# Patient Record
Sex: Female | Born: 1945
Health system: Southern US, Community
[De-identification: ages and names within clinical notes are randomized; demographics above are authoritative.]

## PROBLEM LIST (undated history)

## (undated) DIAGNOSIS — B029 Zoster without complications: Secondary | ICD-10-CM

## (undated) DIAGNOSIS — Z8489 Family history of other specified conditions: Secondary | ICD-10-CM

## (undated) DIAGNOSIS — H269 Unspecified cataract: Secondary | ICD-10-CM

## (undated) DIAGNOSIS — I2510 Atherosclerotic heart disease of native coronary artery without angina pectoris: Secondary | ICD-10-CM

## (undated) DIAGNOSIS — K219 Gastro-esophageal reflux disease without esophagitis: Secondary | ICD-10-CM

## (undated) DIAGNOSIS — Z8719 Personal history of other diseases of the digestive system: Secondary | ICD-10-CM

## (undated) DIAGNOSIS — M3390 Dermatopolymyositis, unspecified, organ involvement unspecified: Secondary | ICD-10-CM

## (undated) DIAGNOSIS — R569 Unspecified convulsions: Secondary | ICD-10-CM

## (undated) DIAGNOSIS — G4730 Sleep apnea, unspecified: Secondary | ICD-10-CM

## (undated) DIAGNOSIS — I5032 Chronic diastolic (congestive) heart failure: Secondary | ICD-10-CM

## (undated) DIAGNOSIS — D649 Anemia, unspecified: Secondary | ICD-10-CM

## (undated) DIAGNOSIS — I480 Paroxysmal atrial fibrillation: Secondary | ICD-10-CM

## (undated) DIAGNOSIS — G9520 Unspecified cord compression: Secondary | ICD-10-CM

## (undated) DIAGNOSIS — I10 Essential (primary) hypertension: Secondary | ICD-10-CM

## (undated) DIAGNOSIS — J342 Deviated nasal septum: Secondary | ICD-10-CM

## (undated) DIAGNOSIS — I471 Supraventricular tachycardia: Secondary | ICD-10-CM

## (undated) DIAGNOSIS — N3945 Continuous leakage: Secondary | ICD-10-CM

## (undated) DIAGNOSIS — E785 Hyperlipidemia, unspecified: Secondary | ICD-10-CM

## (undated) DIAGNOSIS — J189 Pneumonia, unspecified organism: Secondary | ICD-10-CM

## (undated) DIAGNOSIS — R600 Localized edema: Secondary | ICD-10-CM

## (undated) DIAGNOSIS — E039 Hypothyroidism, unspecified: Secondary | ICD-10-CM

## (undated) DIAGNOSIS — M3313 Other dermatomyositis without myopathy: Secondary | ICD-10-CM

## (undated) DIAGNOSIS — K648 Other hemorrhoids: Secondary | ICD-10-CM

## (undated) DIAGNOSIS — K449 Diaphragmatic hernia without obstruction or gangrene: Secondary | ICD-10-CM

## (undated) DIAGNOSIS — Z9289 Personal history of other medical treatment: Secondary | ICD-10-CM

## (undated) DIAGNOSIS — L03115 Cellulitis of right lower limb: Secondary | ICD-10-CM

## (undated) DIAGNOSIS — K8020 Calculus of gallbladder without cholecystitis without obstruction: Secondary | ICD-10-CM

## (undated) DIAGNOSIS — M1990 Unspecified osteoarthritis, unspecified site: Secondary | ICD-10-CM

## (undated) DIAGNOSIS — A0472 Enterocolitis due to Clostridium difficile, not specified as recurrent: Secondary | ICD-10-CM

## (undated) HISTORY — DX: Gastro-esophageal reflux disease without esophagitis: K21.9

## (undated) HISTORY — DX: Hyperlipidemia, unspecified: E78.5

## (undated) HISTORY — DX: Pneumonia, unspecified organism: J18.9

## (undated) HISTORY — PX: NASAL SEPTUM SURGERY: SHX37

## (undated) HISTORY — PX: CATARACT EXTRACTION, BILATERAL: SHX1313

## (undated) HISTORY — DX: Diaphragmatic hernia without obstruction or gangrene: K44.9

## (undated) HISTORY — DX: Deviated nasal septum: J34.2

## (undated) HISTORY — DX: Localized edema: R60.0

## (undated) HISTORY — DX: Unspecified osteoarthritis, unspecified site: M19.90

## (undated) HISTORY — DX: Hypothyroidism, unspecified: E03.9

## (undated) HISTORY — DX: Morbid (severe) obesity due to excess calories: E66.01

## (undated) HISTORY — DX: Enterocolitis due to Clostridium difficile, not specified as recurrent: A04.72

## (undated) HISTORY — DX: Zoster without complications: B02.9

## (undated) HISTORY — DX: Cellulitis of right lower limb: L03.115

## (undated) HISTORY — DX: Chronic diastolic (congestive) heart failure: I50.32

## (undated) HISTORY — PX: TOTAL ABDOMINAL HYSTERECTOMY: SHX209

## (undated) HISTORY — DX: Other dermatomyositis without myopathy: M33.13

## (undated) HISTORY — DX: Sleep apnea, unspecified: G47.30

## (undated) HISTORY — DX: Supraventricular tachycardia: I47.1

## (undated) HISTORY — DX: Atherosclerotic heart disease of native coronary artery without angina pectoris: I25.10

## (undated) HISTORY — DX: Dermatopolymyositis, unspecified, organ involvement unspecified: M33.90

## (undated) HISTORY — DX: Personal history of other medical treatment: Z92.89

## (undated) HISTORY — DX: Continuous leakage: N39.45

## (undated) HISTORY — PX: VERTEBROPLASTY: SHX113

## (undated) HISTORY — DX: Unspecified convulsions: R56.9

## (undated) HISTORY — DX: Essential (primary) hypertension: I10

## (undated) HISTORY — DX: Unspecified cataract: H26.9

## (undated) HISTORY — DX: Personal history of other diseases of the digestive system: Z87.19

## (undated) HISTORY — PX: WISDOM TOOTH EXTRACTION: SHX21

## (undated) HISTORY — DX: Other hemorrhoids: K64.8

## (undated) HISTORY — DX: Anemia, unspecified: D64.9

## (undated) HISTORY — DX: Calculus of gallbladder without cholecystitis without obstruction: K80.20

## (undated) HISTORY — DX: Paroxysmal atrial fibrillation: I48.0

---

## 1998-07-08 ENCOUNTER — Emergency Department (HOSPITAL_COMMUNITY): Admission: EM | Admit: 1998-07-08 | Discharge: 1998-07-08 | Payer: Self-pay | Admitting: Emergency Medicine

## 1999-02-18 ENCOUNTER — Encounter: Payer: Self-pay | Admitting: Family Medicine

## 1999-02-18 ENCOUNTER — Ambulatory Visit (HOSPITAL_COMMUNITY): Admission: RE | Admit: 1999-02-18 | Discharge: 1999-02-18 | Payer: Self-pay | Admitting: Family Medicine

## 1999-08-30 ENCOUNTER — Encounter: Payer: Self-pay | Admitting: Emergency Medicine

## 1999-08-30 ENCOUNTER — Emergency Department (HOSPITAL_COMMUNITY): Admission: EM | Admit: 1999-08-30 | Discharge: 1999-08-30 | Payer: Self-pay | Admitting: Emergency Medicine

## 1999-11-30 ENCOUNTER — Emergency Department (HOSPITAL_COMMUNITY): Admission: EM | Admit: 1999-11-30 | Discharge: 1999-11-30 | Payer: Self-pay | Admitting: Emergency Medicine

## 1999-11-30 ENCOUNTER — Encounter: Payer: Self-pay | Admitting: Emergency Medicine

## 2000-01-05 ENCOUNTER — Other Ambulatory Visit: Admission: RE | Admit: 2000-01-05 | Discharge: 2000-01-05 | Payer: Self-pay | Admitting: Obstetrics and Gynecology

## 2000-03-18 ENCOUNTER — Encounter: Payer: Self-pay | Admitting: Emergency Medicine

## 2000-03-18 ENCOUNTER — Inpatient Hospital Stay (HOSPITAL_COMMUNITY): Admission: EM | Admit: 2000-03-18 | Discharge: 2000-03-20 | Payer: Self-pay | Admitting: Emergency Medicine

## 2000-04-06 ENCOUNTER — Emergency Department (HOSPITAL_COMMUNITY): Admission: EM | Admit: 2000-04-06 | Discharge: 2000-04-06 | Payer: Self-pay | Admitting: *Deleted

## 2000-05-01 ENCOUNTER — Ambulatory Visit (HOSPITAL_COMMUNITY): Admission: RE | Admit: 2000-05-01 | Discharge: 2000-05-01 | Payer: Self-pay | Admitting: Endocrinology

## 2000-05-16 ENCOUNTER — Ambulatory Visit (HOSPITAL_BASED_OUTPATIENT_CLINIC_OR_DEPARTMENT_OTHER): Admission: RE | Admit: 2000-05-16 | Discharge: 2000-05-16 | Payer: Self-pay | Admitting: Pulmonary Disease

## 2000-08-13 ENCOUNTER — Encounter: Payer: Self-pay | Admitting: Emergency Medicine

## 2000-08-13 ENCOUNTER — Inpatient Hospital Stay (HOSPITAL_COMMUNITY): Admission: EM | Admit: 2000-08-13 | Discharge: 2000-08-15 | Payer: Self-pay | Admitting: Emergency Medicine

## 2000-08-22 ENCOUNTER — Inpatient Hospital Stay (HOSPITAL_COMMUNITY): Admission: EM | Admit: 2000-08-22 | Discharge: 2000-08-23 | Payer: Self-pay

## 2000-08-22 ENCOUNTER — Encounter: Payer: Self-pay | Admitting: Internal Medicine

## 2000-08-23 ENCOUNTER — Encounter: Payer: Self-pay | Admitting: Internal Medicine

## 2000-09-10 ENCOUNTER — Inpatient Hospital Stay (HOSPITAL_COMMUNITY): Admission: EM | Admit: 2000-09-10 | Discharge: 2000-09-14 | Payer: Self-pay | Admitting: Emergency Medicine

## 2000-09-10 ENCOUNTER — Encounter: Payer: Self-pay | Admitting: Cardiology

## 2000-10-19 ENCOUNTER — Ambulatory Visit (HOSPITAL_COMMUNITY): Admission: RE | Admit: 2000-10-19 | Discharge: 2000-10-19 | Payer: Self-pay | Admitting: Internal Medicine

## 2000-10-19 ENCOUNTER — Encounter: Payer: Self-pay | Admitting: Internal Medicine

## 2000-11-09 ENCOUNTER — Encounter: Payer: Self-pay | Admitting: Emergency Medicine

## 2000-11-09 ENCOUNTER — Emergency Department (HOSPITAL_COMMUNITY): Admission: EM | Admit: 2000-11-09 | Discharge: 2000-11-09 | Payer: Self-pay | Admitting: Emergency Medicine

## 2000-12-19 ENCOUNTER — Encounter: Payer: Self-pay | Admitting: Cardiology

## 2000-12-20 ENCOUNTER — Inpatient Hospital Stay (HOSPITAL_COMMUNITY): Admission: EM | Admit: 2000-12-20 | Discharge: 2000-12-22 | Payer: Self-pay | Admitting: Emergency Medicine

## 2000-12-24 ENCOUNTER — Emergency Department (HOSPITAL_COMMUNITY): Admission: EM | Admit: 2000-12-24 | Discharge: 2000-12-24 | Payer: Self-pay

## 2001-01-05 ENCOUNTER — Encounter: Admission: RE | Admit: 2001-01-05 | Discharge: 2001-01-05 | Payer: Self-pay | Admitting: Rheumatology

## 2001-01-05 ENCOUNTER — Encounter: Payer: Self-pay | Admitting: Rheumatology

## 2001-01-06 ENCOUNTER — Encounter: Payer: Self-pay | Admitting: Rheumatology

## 2001-01-06 ENCOUNTER — Ambulatory Visit (HOSPITAL_COMMUNITY): Admission: RE | Admit: 2001-01-06 | Discharge: 2001-01-06 | Payer: Self-pay | Admitting: Rheumatology

## 2001-01-08 ENCOUNTER — Encounter: Admission: RE | Admit: 2001-01-08 | Discharge: 2001-01-08 | Payer: Self-pay | Admitting: Internal Medicine

## 2001-01-08 ENCOUNTER — Encounter: Payer: Self-pay | Admitting: Internal Medicine

## 2001-01-09 ENCOUNTER — Encounter: Payer: Self-pay | Admitting: Internal Medicine

## 2001-01-09 ENCOUNTER — Emergency Department (HOSPITAL_COMMUNITY): Admission: EM | Admit: 2001-01-09 | Discharge: 2001-01-09 | Payer: Self-pay | Admitting: Emergency Medicine

## 2001-09-07 ENCOUNTER — Encounter: Admission: RE | Admit: 2001-09-07 | Discharge: 2001-09-10 | Payer: Self-pay | Admitting: Internal Medicine

## 2001-10-02 ENCOUNTER — Encounter: Payer: Self-pay | Admitting: Emergency Medicine

## 2001-10-02 ENCOUNTER — Inpatient Hospital Stay (HOSPITAL_COMMUNITY): Admission: EM | Admit: 2001-10-02 | Discharge: 2001-10-04 | Payer: Self-pay | Admitting: Emergency Medicine

## 2001-12-11 ENCOUNTER — Other Ambulatory Visit: Admission: RE | Admit: 2001-12-11 | Discharge: 2001-12-11 | Payer: Self-pay | Admitting: Obstetrics and Gynecology

## 2002-06-16 ENCOUNTER — Inpatient Hospital Stay (HOSPITAL_COMMUNITY): Admission: EM | Admit: 2002-06-16 | Discharge: 2002-06-18 | Payer: Self-pay | Admitting: Emergency Medicine

## 2002-06-17 ENCOUNTER — Encounter: Payer: Self-pay | Admitting: Endocrinology

## 2003-11-28 ENCOUNTER — Emergency Department (HOSPITAL_COMMUNITY): Admission: EM | Admit: 2003-11-28 | Discharge: 2003-11-28 | Payer: Self-pay | Admitting: *Deleted

## 2004-01-13 ENCOUNTER — Emergency Department (HOSPITAL_COMMUNITY): Admission: EM | Admit: 2004-01-13 | Discharge: 2004-01-14 | Payer: Self-pay | Admitting: Emergency Medicine

## 2004-03-12 ENCOUNTER — Ambulatory Visit (HOSPITAL_COMMUNITY): Admission: RE | Admit: 2004-03-12 | Discharge: 2004-03-12 | Payer: Self-pay | Admitting: Endocrinology

## 2004-04-19 ENCOUNTER — Ambulatory Visit: Payer: Self-pay | Admitting: Cardiology

## 2004-05-07 ENCOUNTER — Ambulatory Visit: Payer: Self-pay | Admitting: Internal Medicine

## 2004-06-04 ENCOUNTER — Ambulatory Visit: Payer: Self-pay | Admitting: Cardiology

## 2004-06-24 ENCOUNTER — Ambulatory Visit: Payer: Self-pay | Admitting: Cardiology

## 2004-07-02 ENCOUNTER — Ambulatory Visit: Payer: Self-pay | Admitting: Cardiovascular Disease

## 2004-07-30 ENCOUNTER — Ambulatory Visit: Payer: Self-pay | Admitting: Cardiology

## 2004-08-27 ENCOUNTER — Ambulatory Visit: Payer: Self-pay | Admitting: Internal Medicine

## 2004-09-21 ENCOUNTER — Ambulatory Visit: Payer: Self-pay | Admitting: Cardiology

## 2004-10-19 ENCOUNTER — Ambulatory Visit: Payer: Self-pay | Admitting: *Deleted

## 2004-10-24 ENCOUNTER — Observation Stay (HOSPITAL_COMMUNITY): Admission: EM | Admit: 2004-10-24 | Discharge: 2004-10-25 | Payer: Self-pay | Admitting: Emergency Medicine

## 2004-10-25 ENCOUNTER — Ambulatory Visit: Payer: Self-pay | Admitting: Cardiology

## 2004-11-16 ENCOUNTER — Ambulatory Visit: Payer: Self-pay | Admitting: Cardiology

## 2004-11-19 ENCOUNTER — Ambulatory Visit: Payer: Self-pay | Admitting: Cardiology

## 2004-11-30 ENCOUNTER — Ambulatory Visit: Payer: Self-pay | Admitting: Cardiology

## 2004-12-21 ENCOUNTER — Ambulatory Visit: Payer: Self-pay | Admitting: Cardiology

## 2005-01-11 ENCOUNTER — Ambulatory Visit: Payer: Self-pay | Admitting: Cardiology

## 2005-02-08 ENCOUNTER — Ambulatory Visit: Payer: Self-pay | Admitting: Cardiology

## 2005-02-21 ENCOUNTER — Emergency Department (HOSPITAL_COMMUNITY): Admission: EM | Admit: 2005-02-21 | Discharge: 2005-02-21 | Payer: Self-pay | Admitting: Emergency Medicine

## 2005-02-23 ENCOUNTER — Ambulatory Visit: Payer: Self-pay | Admitting: Cardiology

## 2005-03-01 ENCOUNTER — Ambulatory Visit: Payer: Self-pay | Admitting: Internal Medicine

## 2005-03-16 ENCOUNTER — Ambulatory Visit: Payer: Self-pay | Admitting: Cardiology

## 2005-03-24 ENCOUNTER — Ambulatory Visit: Payer: Self-pay | Admitting: Endocrinology

## 2005-04-01 ENCOUNTER — Ambulatory Visit: Payer: Self-pay | Admitting: Internal Medicine

## 2005-04-01 ENCOUNTER — Inpatient Hospital Stay (HOSPITAL_COMMUNITY): Admission: EM | Admit: 2005-04-01 | Discharge: 2005-04-04 | Payer: Self-pay | Admitting: Emergency Medicine

## 2005-04-13 ENCOUNTER — Ambulatory Visit: Payer: Self-pay | Admitting: *Deleted

## 2005-05-09 ENCOUNTER — Ambulatory Visit: Payer: Self-pay | Admitting: Cardiology

## 2005-05-26 ENCOUNTER — Ambulatory Visit: Payer: Self-pay | Admitting: Internal Medicine

## 2005-06-07 ENCOUNTER — Ambulatory Visit: Payer: Self-pay | Admitting: Cardiology

## 2005-06-20 ENCOUNTER — Ambulatory Visit: Payer: Self-pay | Admitting: Cardiology

## 2005-06-23 ENCOUNTER — Other Ambulatory Visit: Admission: RE | Admit: 2005-06-23 | Discharge: 2005-06-23 | Payer: Self-pay | Admitting: Obstetrics and Gynecology

## 2005-06-27 ENCOUNTER — Ambulatory Visit: Payer: Self-pay | Admitting: Cardiology

## 2005-07-11 ENCOUNTER — Ambulatory Visit: Payer: Self-pay | Admitting: Cardiology

## 2005-08-01 ENCOUNTER — Ambulatory Visit: Payer: Self-pay | Admitting: Cardiology

## 2005-08-15 ENCOUNTER — Ambulatory Visit: Payer: Self-pay | Admitting: Cardiology

## 2005-08-29 ENCOUNTER — Ambulatory Visit: Payer: Self-pay | Admitting: Cardiology

## 2005-09-12 ENCOUNTER — Ambulatory Visit: Payer: Self-pay | Admitting: Cardiology

## 2005-10-04 ENCOUNTER — Ambulatory Visit: Payer: Self-pay | Admitting: Cardiovascular Disease

## 2005-10-04 ENCOUNTER — Ambulatory Visit: Payer: Self-pay | Admitting: Cardiology

## 2005-10-24 ENCOUNTER — Ambulatory Visit: Payer: Self-pay | Admitting: Cardiology

## 2005-11-11 ENCOUNTER — Ambulatory Visit: Payer: Self-pay | Admitting: Internal Medicine

## 2005-11-21 ENCOUNTER — Ambulatory Visit: Payer: Self-pay | Admitting: Cardiology

## 2005-12-13 ENCOUNTER — Ambulatory Visit: Payer: Self-pay | Admitting: Internal Medicine

## 2005-12-19 ENCOUNTER — Ambulatory Visit: Payer: Self-pay | Admitting: Cardiology

## 2005-12-26 ENCOUNTER — Ambulatory Visit: Payer: Self-pay | Admitting: Cardiology

## 2006-01-23 ENCOUNTER — Ambulatory Visit: Payer: Self-pay | Admitting: Cardiology

## 2006-02-14 ENCOUNTER — Ambulatory Visit: Payer: Self-pay | Admitting: Cardiovascular Disease

## 2006-02-24 ENCOUNTER — Ambulatory Visit: Payer: Self-pay | Admitting: Internal Medicine

## 2006-03-03 ENCOUNTER — Ambulatory Visit: Payer: Self-pay | Admitting: Cardiovascular Disease

## 2006-03-07 ENCOUNTER — Ambulatory Visit: Payer: Self-pay | Admitting: Cardiology

## 2006-03-07 ENCOUNTER — Inpatient Hospital Stay (HOSPITAL_COMMUNITY): Admission: EM | Admit: 2006-03-07 | Discharge: 2006-03-13 | Payer: Self-pay | Admitting: *Deleted

## 2006-03-17 ENCOUNTER — Ambulatory Visit: Payer: Self-pay | Admitting: Cardiology

## 2006-03-20 ENCOUNTER — Ambulatory Visit: Payer: Self-pay

## 2006-03-24 ENCOUNTER — Ambulatory Visit: Payer: Self-pay | Admitting: Cardiology

## 2006-04-05 ENCOUNTER — Ambulatory Visit: Payer: Self-pay | Admitting: Cardiology

## 2006-05-03 ENCOUNTER — Ambulatory Visit: Payer: Self-pay | Admitting: Cardiology

## 2006-05-31 ENCOUNTER — Ambulatory Visit: Payer: Self-pay | Admitting: Cardiology

## 2006-06-28 ENCOUNTER — Ambulatory Visit: Payer: Self-pay | Admitting: Cardiology

## 2006-07-26 ENCOUNTER — Ambulatory Visit: Payer: Self-pay | Admitting: Cardiology

## 2006-08-09 ENCOUNTER — Ambulatory Visit: Payer: Self-pay | Admitting: Internal Medicine

## 2006-08-15 ENCOUNTER — Ambulatory Visit (HOSPITAL_COMMUNITY): Admission: RE | Admit: 2006-08-15 | Discharge: 2006-08-15 | Payer: Self-pay | Admitting: Emergency Medicine

## 2006-08-16 ENCOUNTER — Emergency Department (HOSPITAL_COMMUNITY): Admission: EM | Admit: 2006-08-16 | Discharge: 2006-08-16 | Payer: Self-pay | Admitting: Emergency Medicine

## 2006-08-30 ENCOUNTER — Ambulatory Visit: Payer: Self-pay | Admitting: Cardiology

## 2006-09-13 ENCOUNTER — Emergency Department (HOSPITAL_COMMUNITY): Admission: EM | Admit: 2006-09-13 | Discharge: 2006-09-13 | Payer: Self-pay | Admitting: Emergency Medicine

## 2006-09-20 ENCOUNTER — Ambulatory Visit: Payer: Self-pay | Admitting: Cardiology

## 2006-09-27 ENCOUNTER — Ambulatory Visit: Payer: Self-pay | Admitting: Internal Medicine

## 2006-10-06 ENCOUNTER — Ambulatory Visit: Payer: Self-pay | Admitting: Cardiology

## 2006-10-24 ENCOUNTER — Ambulatory Visit: Payer: Self-pay | Admitting: Cardiology

## 2006-11-07 ENCOUNTER — Ambulatory Visit: Payer: Self-pay | Admitting: Cardiology

## 2006-11-13 ENCOUNTER — Encounter: Admission: RE | Admit: 2006-11-13 | Discharge: 2006-11-13 | Payer: Self-pay | Admitting: Internal Medicine

## 2006-11-28 ENCOUNTER — Ambulatory Visit: Payer: Self-pay | Admitting: Internal Medicine

## 2006-12-18 ENCOUNTER — Ambulatory Visit: Payer: Self-pay | Admitting: Cardiology

## 2007-01-15 ENCOUNTER — Ambulatory Visit: Payer: Self-pay | Admitting: Cardiology

## 2007-02-05 ENCOUNTER — Ambulatory Visit: Payer: Self-pay | Admitting: Cardiology

## 2007-02-19 ENCOUNTER — Ambulatory Visit: Payer: Self-pay | Admitting: Cardiology

## 2007-03-05 ENCOUNTER — Ambulatory Visit: Payer: Self-pay | Admitting: Cardiovascular Disease

## 2007-03-19 ENCOUNTER — Ambulatory Visit: Payer: Self-pay | Admitting: Cardiology

## 2007-03-30 ENCOUNTER — Ambulatory Visit: Payer: Self-pay | Admitting: Cardiology

## 2007-04-10 ENCOUNTER — Ambulatory Visit: Payer: Self-pay | Admitting: Cardiology

## 2007-05-08 ENCOUNTER — Ambulatory Visit: Payer: Self-pay | Admitting: Cardiovascular Disease

## 2007-06-05 ENCOUNTER — Ambulatory Visit: Payer: Self-pay | Admitting: Cardiology

## 2007-06-26 ENCOUNTER — Ambulatory Visit: Payer: Self-pay | Admitting: Cardiology

## 2007-07-24 ENCOUNTER — Ambulatory Visit: Payer: Self-pay | Admitting: Internal Medicine

## 2007-08-21 ENCOUNTER — Ambulatory Visit: Payer: Self-pay | Admitting: Cardiology

## 2007-09-18 ENCOUNTER — Ambulatory Visit: Payer: Self-pay | Admitting: Cardiovascular Disease

## 2007-09-26 ENCOUNTER — Ambulatory Visit: Payer: Self-pay | Admitting: Cardiology

## 2007-10-16 ENCOUNTER — Ambulatory Visit: Payer: Self-pay | Admitting: Cardiovascular Disease

## 2007-11-13 ENCOUNTER — Ambulatory Visit: Payer: Self-pay | Admitting: Cardiovascular Disease

## 2007-12-11 ENCOUNTER — Ambulatory Visit: Payer: Self-pay | Admitting: Internal Medicine

## 2008-01-08 ENCOUNTER — Ambulatory Visit: Payer: Self-pay | Admitting: Cardiology

## 2008-01-29 ENCOUNTER — Encounter: Admission: RE | Admit: 2008-01-29 | Discharge: 2008-01-29 | Payer: Self-pay | Admitting: Internal Medicine

## 2008-01-29 ENCOUNTER — Ambulatory Visit: Payer: Self-pay | Admitting: Cardiology

## 2008-02-19 ENCOUNTER — Ambulatory Visit: Payer: Self-pay | Admitting: Cardiology

## 2008-03-17 ENCOUNTER — Ambulatory Visit: Payer: Self-pay | Admitting: Internal Medicine

## 2008-03-17 ENCOUNTER — Ambulatory Visit: Payer: Self-pay | Admitting: Cardiology

## 2008-03-20 ENCOUNTER — Ambulatory Visit: Payer: Self-pay

## 2008-03-21 ENCOUNTER — Ambulatory Visit: Payer: Self-pay

## 2008-04-08 ENCOUNTER — Ambulatory Visit: Payer: Self-pay

## 2008-04-16 ENCOUNTER — Ambulatory Visit: Payer: Self-pay | Admitting: Cardiovascular Disease

## 2008-05-12 ENCOUNTER — Ambulatory Visit: Payer: Self-pay | Admitting: Cardiovascular Disease

## 2008-06-02 ENCOUNTER — Ambulatory Visit: Payer: Self-pay | Admitting: Cardiology

## 2008-06-23 ENCOUNTER — Ambulatory Visit: Payer: Self-pay | Admitting: Cardiology

## 2008-07-21 ENCOUNTER — Ambulatory Visit: Payer: Self-pay | Admitting: Cardiology

## 2008-08-18 ENCOUNTER — Ambulatory Visit: Payer: Self-pay | Admitting: Internal Medicine

## 2008-09-05 ENCOUNTER — Ambulatory Visit: Payer: Self-pay | Admitting: Cardiology

## 2008-09-19 ENCOUNTER — Ambulatory Visit: Payer: Self-pay | Admitting: Internal Medicine

## 2008-09-20 DIAGNOSIS — R609 Edema, unspecified: Secondary | ICD-10-CM | POA: Insufficient documentation

## 2008-09-20 DIAGNOSIS — R0609 Other forms of dyspnea: Secondary | ICD-10-CM | POA: Insufficient documentation

## 2008-09-20 DIAGNOSIS — R0989 Other specified symptoms and signs involving the circulatory and respiratory systems: Secondary | ICD-10-CM | POA: Insufficient documentation

## 2008-09-25 ENCOUNTER — Encounter: Payer: Self-pay | Admitting: Cardiology

## 2008-09-25 ENCOUNTER — Ambulatory Visit: Payer: Self-pay | Admitting: Cardiology

## 2008-09-25 DIAGNOSIS — I2510 Atherosclerotic heart disease of native coronary artery without angina pectoris: Secondary | ICD-10-CM | POA: Insufficient documentation

## 2008-10-13 ENCOUNTER — Ambulatory Visit: Payer: Self-pay | Admitting: Internal Medicine

## 2008-10-27 ENCOUNTER — Ambulatory Visit: Payer: Self-pay | Admitting: Internal Medicine

## 2008-11-11 ENCOUNTER — Encounter: Payer: Self-pay | Admitting: *Deleted

## 2008-11-11 ENCOUNTER — Ambulatory Visit: Payer: Self-pay | Admitting: Cardiovascular Disease

## 2008-11-11 LAB — CONVERTED CEMR LAB
POC INR: 2.3
Protime: 18.5

## 2008-12-02 ENCOUNTER — Encounter (INDEPENDENT_AMBULATORY_CARE_PROVIDER_SITE_OTHER): Payer: Self-pay | Admitting: Pharmacist

## 2008-12-02 ENCOUNTER — Ambulatory Visit: Payer: Self-pay | Admitting: Cardiovascular Disease

## 2008-12-02 LAB — CONVERTED CEMR LAB
POC INR: 2.5
Protime: 19.2

## 2008-12-17 ENCOUNTER — Encounter: Payer: Self-pay | Admitting: *Deleted

## 2008-12-30 ENCOUNTER — Ambulatory Visit: Payer: Self-pay | Admitting: Cardiology

## 2008-12-30 LAB — CONVERTED CEMR LAB
POC INR: 2.8
Prothrombin Time: 20.1 s

## 2009-01-22 ENCOUNTER — Encounter (INDEPENDENT_AMBULATORY_CARE_PROVIDER_SITE_OTHER): Payer: Self-pay | Admitting: *Deleted

## 2009-01-27 ENCOUNTER — Ambulatory Visit: Payer: Self-pay | Admitting: Cardiovascular Disease

## 2009-01-27 LAB — CONVERTED CEMR LAB: POC INR: 1.9

## 2009-02-18 ENCOUNTER — Ambulatory Visit: Payer: Self-pay | Admitting: Cardiology

## 2009-02-18 LAB — CONVERTED CEMR LAB: POC INR: 2

## 2009-03-10 ENCOUNTER — Ambulatory Visit: Payer: Self-pay | Admitting: Cardiology

## 2009-03-10 ENCOUNTER — Telehealth (INDEPENDENT_AMBULATORY_CARE_PROVIDER_SITE_OTHER): Payer: Self-pay | Admitting: Physician Assistant

## 2009-03-10 LAB — CONVERTED CEMR LAB
INR: 7.1 (ref 0.8–1.0)
Prothrombin Time: 72.5 s (ref 9.1–11.7)

## 2009-03-13 ENCOUNTER — Ambulatory Visit: Payer: Self-pay | Admitting: Cardiology

## 2009-03-13 ENCOUNTER — Ambulatory Visit: Payer: Self-pay | Admitting: Internal Medicine

## 2009-03-13 LAB — CONVERTED CEMR LAB: POC INR: 3.7

## 2009-03-20 ENCOUNTER — Ambulatory Visit: Payer: Self-pay | Admitting: Internal Medicine

## 2009-03-20 LAB — CONVERTED CEMR LAB: POC INR: 2.8

## 2009-03-23 LAB — CONVERTED CEMR LAB: Digitoxin Lvl: 0.8 ng/mL (ref 0.8–2.0)

## 2009-04-10 ENCOUNTER — Ambulatory Visit: Payer: Self-pay | Admitting: Cardiovascular Disease

## 2009-04-10 LAB — CONVERTED CEMR LAB: POC INR: 3.7

## 2009-04-24 ENCOUNTER — Ambulatory Visit: Payer: Self-pay | Admitting: Internal Medicine

## 2009-04-24 LAB — CONVERTED CEMR LAB: POC INR: 2.7

## 2009-05-15 ENCOUNTER — Ambulatory Visit: Payer: Self-pay | Admitting: Cardiology

## 2009-05-15 LAB — CONVERTED CEMR LAB: POC INR: 2.6

## 2009-06-11 ENCOUNTER — Ambulatory Visit: Payer: Self-pay | Admitting: Cardiovascular Disease

## 2009-06-11 LAB — CONVERTED CEMR LAB: POC INR: 2.4

## 2009-07-10 ENCOUNTER — Ambulatory Visit: Payer: Self-pay | Admitting: Internal Medicine

## 2009-07-10 LAB — CONVERTED CEMR LAB: POC INR: 3

## 2009-08-07 ENCOUNTER — Ambulatory Visit: Payer: Self-pay | Admitting: Internal Medicine

## 2009-08-07 LAB — CONVERTED CEMR LAB: POC INR: 2.6

## 2009-08-11 ENCOUNTER — Encounter: Admission: RE | Admit: 2009-08-11 | Discharge: 2009-08-11 | Payer: Self-pay | Admitting: Internal Medicine

## 2009-08-14 ENCOUNTER — Ambulatory Visit: Payer: Self-pay | Admitting: Cardiology

## 2009-09-04 ENCOUNTER — Ambulatory Visit: Payer: Self-pay | Admitting: Cardiology

## 2009-09-04 LAB — CONVERTED CEMR LAB: POC INR: 3.1

## 2009-10-01 ENCOUNTER — Ambulatory Visit: Payer: Self-pay | Admitting: Internal Medicine

## 2009-10-01 LAB — CONVERTED CEMR LAB: POC INR: 2.8

## 2009-10-29 ENCOUNTER — Ambulatory Visit: Payer: Self-pay | Admitting: Internal Medicine

## 2009-10-29 LAB — CONVERTED CEMR LAB: POC INR: 2.6

## 2009-11-10 ENCOUNTER — Encounter: Admission: RE | Admit: 2009-11-10 | Discharge: 2009-11-10 | Payer: Self-pay | Admitting: Internal Medicine

## 2009-11-26 ENCOUNTER — Ambulatory Visit: Payer: Self-pay | Admitting: Cardiovascular Disease

## 2009-11-26 LAB — CONVERTED CEMR LAB: POC INR: 4.1

## 2009-12-10 ENCOUNTER — Ambulatory Visit: Payer: Self-pay | Admitting: Cardiology

## 2009-12-10 LAB — CONVERTED CEMR LAB: POC INR: 2.9

## 2010-01-07 ENCOUNTER — Ambulatory Visit: Payer: Self-pay | Admitting: Cardiology

## 2010-01-07 LAB — CONVERTED CEMR LAB: POC INR: 2.9

## 2010-02-04 ENCOUNTER — Ambulatory Visit: Payer: Self-pay | Admitting: Internal Medicine

## 2010-02-04 LAB — CONVERTED CEMR LAB: POC INR: 3.2

## 2010-02-26 ENCOUNTER — Ambulatory Visit: Payer: Self-pay | Admitting: Cardiology

## 2010-03-04 ENCOUNTER — Ambulatory Visit: Payer: Self-pay | Admitting: Internal Medicine

## 2010-03-04 LAB — CONVERTED CEMR LAB: POC INR: 2.9

## 2010-04-02 ENCOUNTER — Ambulatory Visit: Payer: Self-pay | Admitting: Cardiovascular Disease

## 2010-04-02 LAB — CONVERTED CEMR LAB: POC INR: 4.1

## 2010-04-16 ENCOUNTER — Ambulatory Visit: Payer: Self-pay | Admitting: Cardiology

## 2010-04-16 LAB — CONVERTED CEMR LAB: POC INR: 2.8

## 2010-05-14 ENCOUNTER — Ambulatory Visit: Payer: Self-pay | Admitting: Cardiology

## 2010-05-14 LAB — CONVERTED CEMR LAB: POC INR: 3.4

## 2010-06-04 ENCOUNTER — Ambulatory Visit: Payer: Self-pay | Admitting: Cardiology

## 2010-06-04 LAB — CONVERTED CEMR LAB: POC INR: 3

## 2010-06-24 ENCOUNTER — Ambulatory Visit
Admission: RE | Admit: 2010-06-24 | Discharge: 2010-06-24 | Payer: Self-pay | Source: Home / Self Care | Attending: Cardiology | Admitting: Cardiology

## 2010-06-24 LAB — CONVERTED CEMR LAB: POC INR: 3.5

## 2010-07-08 ENCOUNTER — Ambulatory Visit: Admission: RE | Admit: 2010-07-08 | Discharge: 2010-07-08 | Payer: Self-pay | Source: Home / Self Care

## 2010-07-08 LAB — CONVERTED CEMR LAB: POC INR: 3.1

## 2010-07-13 NOTE — Letter (Signed)
Summary: Handout Printed  Printed Handout:  - Coumadin Instructions 

## 2010-07-13 NOTE — Medication Information (Signed)
Summary: rov-tp  Anticoagulant Therapy  Managed by: Weston Brass, PharmD Referring MD: Valera Castle MD PCP: Dr. Hunt Oris MD: Eden Emms MD, Theron Arista Indication 1: Atrial Fibrillation (ICD-427.31) Lab Used: [facility] Site: [facility] INR POC 4.1 INR RANGE 2 - 3  Dietary changes: yes       Details: less salads  Health status changes: no    Bleeding/hemorrhagic complications: no    Recent/future hospitalizations: no    Any changes in medication regimen? yes       Details: took cipro x 6 days and bactrim DS x 5 days for infection   Recent/future dental: no  Any missed doses?: no       Is patient compliant with meds? yes       Current Medications (verified): 1)  Sotalol Hcl 120 Mg Tabs (Sotalol Hcl) .Marland Kitchen.. 1 Tab Two Times A Day 2)  Digoxin 0.125 Mg Tabs (Digoxin) .Marland Kitchen.. 1 Tab Once Daily 3)  Ramipril 2.5 Mg Caps (Ramipril) .Marland Kitchen.. 1 Once Daily 4)  Lasix 40 Mg Tabs (Furosemide) .Marland Kitchen.. 1 Tab Qam..1/2 Tab At Bedtime 5)  Aspirin 81 Mg Tbec (Aspirin) .... Take One Tablet By Mouth Daily 6)  Potassium Chloride Crys Cr 20 Meq Cr-Tabs (Potassium Chloride Crys Cr) .Marland Kitchen.. 1 Tab Two Times A Day 7)  Synthroid 100 Mcg Tabs (Levothyroxine Sodium) .Marland Kitchen.. 1 Tab Once Daily 8)  Nitroglycerin 0.4 Mg Subl (Nitroglycerin) .... One Tablet Under Tongue Every 5 Minutes As Needed For Chest Pain---May Repeat Times Three 9)  Methotrexate 2.5 Mg Tabs (Methotrexate Sodium) .... 5 Tabs Weekly 10)  Hydroxychloroquine Sulfate 200 Mg Tabs (Hydroxychloroquine Sulfate) .Marland Kitchen.. 1 Tab Two Times A Day 11)  Prednisone 10 Mg Tabs (Prednisone) .Marland Kitchen.. 1 Tab Once Daily 12)  Calcium 600 1500 Mg Tabs (Calcium Carbonate) .Marland Kitchen.. 1 Tab Once Daily 13)  Actonel 150 Mg Tabs (Risedronate Sodium) .Marland Kitchen.. 1 Tab Monthly 14)  Protonix 40 Mg Pack (Pantoprazole Sodium) .Marland Kitchen.. 1 Tab Once Daily 15)  Lyrica 75 Mg Caps (Pregabalin) .Marland Kitchen.. 1 Tab Once Daily 16)  Multivitamins   Tabs (Multiple Vitamin) .Marland Kitchen.. 1 Tab Once Daily 17)  Vitamin D 78295 Unit Caps  (Ergocalciferol) .Marland Kitchen.. 1 Tab Every Month 18)  Allopurinol 300 Mg Tabs (Allopurinol) .Marland Kitchen.. 1 Tab Once Daily 19)  Coumadin 5 Mg Tabs (Warfarin Sodium) .... Take As Directed By Coumadin Clinic. 20)  Vitamin D3 2000 Unit Caps (Cholecalciferol) .Marland Kitchen.. 1 Tab Once Daily 21)  Meloxicam 15 Mg Tabs (Meloxicam) .... Take 1 Tablets Daily  Allergies: 1)  ! * Butoconazole 2)  ! * Terazole  Anticoagulation Management History:      The patient is taking warfarin and comes in today for a routine follow up visit.  Negative risk factors for bleeding include an age less than 30 years old.  The bleeding index is 'low risk'.  Positive CHADS2 values include History of HTN.  Negative CHADS2 values include Age > 19 years old.  The start date was 09/12/2000.  Her last INR was 7.1.  Anticoagulation responsible provider: Eden Emms MD, Theron Arista.  INR POC: 4.1.  Cuvette Lot#: [id number].  Exp: 01/2011.    Anticoagulation Management Assessment/Plan:      The patient's current anticoagulation dose is Coumadin 5 mg tabs: Take as directed by coumadin clinic..  The target INR is 2.0-3.0.  The next INR is due 12/10/2009.  Anticoagulation instructions were given to patient.  Results were reviewed/authorized by Weston Brass, PharmD.  She was notified by Weston Brass PharmD.  Prior Anticoagulation Instructions: The patient is to continue with the same dose of coumadin.  This dosage includes: 2.5mg  daily except 5mg  on MWF.  Current Anticoagulation Instructions: INR 4.1  Skip today's dose of Coumadin, take 1/2 tablet tomorrow, then resume same dose of 1/2 tablet every day except 1 tablet on Monday, Wednesday and Friday

## 2010-07-13 NOTE — Medication Information (Signed)
Summary: rov/tm   Anticoagulant Therapy  Managed by: Weston Brass, PharmD Referring MD: Valera Castle MD PCP: Dr. Hunt Oris MD: Jens Som MD, Arlys John Indication 1: Atrial Fibrillation (ICD-427.31) Lab Used: [facility] Site: [facility] INR POC 3.4 INR RANGE 2 - 3  Dietary changes: no    Health status changes: no    Bleeding/hemorrhagic complications: no    Recent/future hospitalizations: no    Any changes in medication regimen? yes       Details: stopped multivitamin with vitamin k  Recent/future dental: no  Any missed doses?: no       Is patient compliant with meds? yes       Current Medications (verified): 1)  Sotalol Hcl 120 Mg Tabs (Sotalol Hcl) .Marland Kitchen.. 1 Tab Two Times A Day 2)  Digoxin 0.125 Mg Tabs (Digoxin) .Marland Kitchen.. 1 Tab Once Daily 3)  Lasix 40 Mg Tabs (Furosemide) .Marland Kitchen.. 1 Tab Qam..1/2 Tab At Bedtime 4)  Aspirin 81 Mg Tbec (Aspirin) .... Take One Tablet By Mouth Daily 5)  Potassium Chloride Crys Cr 20 Meq Cr-Tabs (Potassium Chloride Crys Cr) .Marland Kitchen.. 1 Tab Two Times A Day 6)  Synthroid 100 Mcg Tabs (Levothyroxine Sodium) .Marland Kitchen.. 1 Tab Once Daily 7)  Nitroglycerin 0.4 Mg Subl (Nitroglycerin) .... One Tablet Under Tongue Every 5 Minutes As Needed For Chest Pain---May Repeat Times Three 8)  Methotrexate 2.5 Mg Tabs (Methotrexate Sodium) .... 5 Tabs Weekly 9)  Hydroxychloroquine Sulfate 200 Mg Tabs (Hydroxychloroquine Sulfate) .Marland Kitchen.. 1 Tab Two Times A Day 10)  Prednisone 10 Mg Tabs (Prednisone) .Marland Kitchen.. 1 Tab Once Daily 11)  Calcium 600 1500 Mg Tabs (Calcium Carbonate) .Marland Kitchen.. 1 Tab Once Daily 12)  Actonel 150 Mg Tabs (Risedronate Sodium) .Marland Kitchen.. 1 Tab Monthly 13)  Protonix 40 Mg Pack (Pantoprazole Sodium) .Marland Kitchen.. 1 Tab Once Daily 14)  Multivitamins   Tabs (Multiple Vitamin) .Marland Kitchen.. 1 Tab Once Daily 15)  Allopurinol 300 Mg Tabs (Allopurinol) .Marland Kitchen.. 1 Tab Once Daily 16)  Coumadin 5 Mg Tabs (Warfarin Sodium) .... Take As Directed By Coumadin Clinic. 17)  Vitamin D3 2000 Unit Caps (Cholecalciferol)  .Marland Kitchen.. 1 Tab Once Daily  Allergies: 1)  ! * Butoconazole 2)  ! * Terazole  Anticoagulation Management History:      The patient is taking warfarin and comes in today for a routine follow up visit.  Negative risk factors for bleeding include an age less than 8 years old.  The bleeding index is 'low risk'.  Positive CHADS2 values include History of HTN.  Negative CHADS2 values include Age > 48 years old.  The start date was 09/12/2000.  Her last INR was 7.1.  Anticoagulation responsible provider: Jens Som MD, Arlys John.  INR POC: 3.4.  Cuvette Lot#: [id number].  Exp: 05/2011.    Anticoagulation Management Assessment/Plan:      The patient's current anticoagulation dose is Coumadin 5 mg tabs: Take as directed by coumadin clinic..  The target INR is 2.0-3.0.  The next INR is due 06/04/2010.  Anticoagulation instructions were given to patient.  Results were reviewed/authorized by Weston Brass, PharmD.  She was notified by Weston Brass PharmD.         Prior Anticoagulation Instructions: INR 2.8 Continue 2.5mg s everyday except 5mg s on Mondays, Wednesdays and Fridays. Recheck in 4 weeks.   Current Anticoagulation Instructions: INR 3.4  Skip today's dose of Coumadin then decrease dose to 1/2 tablet every day except 1 tablet on Monday and Friday.  Recheck INR in 3 weeks.

## 2010-07-13 NOTE — Medication Information (Signed)
Summary: rov/jaj      Allergies Added:  Anticoagulant Therapy  Managed by: Reina Fuse, PharmD Referring MD: Valera Castle MD PCP: Dr. Hunt Oris MD: Kristeen Miss Indication 1: Atrial Fibrillation (ICD-427.31) Lab Used: [facility] Site: [facility] INR POC 4.1 INR RANGE 2 - 3  Dietary changes: no    Health status changes: no    Bleeding/hemorrhagic complications: no    Recent/future hospitalizations: no    Any changes in medication regimen? no    Recent/future dental: no  Any missed doses?: no       Is patient compliant with meds? yes      Comments: Pt reports having some stomach upset with diarrhea last week.   Current Medications (verified): 1)  Sotalol Hcl 120 Mg Tabs (Sotalol Hcl) .Marland Kitchen.. 1 Tab Two Times A Day 2)  Digoxin 0.125 Mg Tabs (Digoxin) .Marland Kitchen.. 1 Tab Once Daily 3)  Lasix 40 Mg Tabs (Furosemide) .Marland Kitchen.. 1 Tab Qam..1/2 Tab At Bedtime 4)  Aspirin 81 Mg Tbec (Aspirin) .... Take One Tablet By Mouth Daily 5)  Potassium Chloride Crys Cr 20 Meq Cr-Tabs (Potassium Chloride Crys Cr) .Marland Kitchen.. 1 Tab Two Times A Day 6)  Synthroid 100 Mcg Tabs (Levothyroxine Sodium) .Marland Kitchen.. 1 Tab Once Daily 7)  Nitroglycerin 0.4 Mg Subl (Nitroglycerin) .... One Tablet Under Tongue Every 5 Minutes As Needed For Chest Pain---May Repeat Times Three 8)  Methotrexate 2.5 Mg Tabs (Methotrexate Sodium) .... 5 Tabs Weekly 9)  Hydroxychloroquine Sulfate 200 Mg Tabs (Hydroxychloroquine Sulfate) .Marland Kitchen.. 1 Tab Two Times A Day 10)  Prednisone 10 Mg Tabs (Prednisone) .Marland Kitchen.. 1 Tab Once Daily 11)  Calcium 600 1500 Mg Tabs (Calcium Carbonate) .Marland Kitchen.. 1 Tab Once Daily 12)  Actonel 150 Mg Tabs (Risedronate Sodium) .Marland Kitchen.. 1 Tab Monthly 13)  Protonix 40 Mg Pack (Pantoprazole Sodium) .Marland Kitchen.. 1 Tab Once Daily 14)  Multivitamins   Tabs (Multiple Vitamin) .Marland Kitchen.. 1 Tab Once Daily 15)  Vitamin D 16109 Unit Caps (Ergocalciferol) .Marland Kitchen.. 1 Tab Every Month 16)  Allopurinol 300 Mg Tabs (Allopurinol) .Marland Kitchen.. 1 Tab Once Daily 17)  Coumadin 5 Mg  Tabs (Warfarin Sodium) .... Take As Directed By Coumadin Clinic. 18)  Vitamin D3 2000 Unit Caps (Cholecalciferol) .Marland Kitchen.. 1 Tab Once Daily  Allergies (verified): 1)  ! * Butoconazole 2)  ! * Terazole  Anticoagulation Management History:      The patient is taking warfarin and comes in today for a routine follow up visit.  Negative risk factors for bleeding include an age less than 84 years old.  The bleeding index is 'low risk'.  Positive CHADS2 values include History of HTN.  Negative CHADS2 values include Age > 73 years old.  The start date was 09/12/2000.  Her last INR was 7.1.  Anticoagulation responsible provider: Kristeen Miss.  INR POC: 4.1.  Cuvette Lot#: [id number].  Exp: 04/2011.    Anticoagulation Management Assessment/Plan:      The patient's current anticoagulation dose is Coumadin 5 mg tabs: Take as directed by coumadin clinic..  The target INR is 2.0-3.0.  The next INR is due 04/16/2010.  Anticoagulation instructions were given to patient.  Results were reviewed/authorized by Reina Fuse, PharmD.  She was notified by Reina Fuse PharmD.         Prior Anticoagulation Instructions: INR 2.9  Continue taking 1/2 tablet everyday except take 1 tablet on Mondays, Wednesdays, and Fridays. Re-check INR in 4 weeks.   Current Anticoagulation Instructions: INR 4.1  Do not take Coumadin on Friday, October  21st or Saturday, October 22nd. Then, resume taking Coumadin 2.5 mg (0.5 tab) on Sun, Tues, Thur, Sat and Coumadin 5 mg (1 tab) on Mon, Wed, Fri. Return to clinic in 2 weeks.

## 2010-07-13 NOTE — Medication Information (Signed)
Summary: rov/sp  Anticoagulant Therapy  Managed by: Leota Sauers, PharmD, BCPS, CPP Referring MD: Valera Castle MD Supervising MD: Eden Emms MD, Theron Arista Indication 1: Atrial Fibrillation (ICD-427.31) Lab Used: [facility] 20.1 INR POC 2.8 INR RANGE 2 - 3  Dietary changes: no    Health status changes: no    Bleeding/hemorrhagic complications: no    Recent/future hospitalizations: no    Any changes in medication regimen? yes       Details: missed MVI with k x3days, took 3 days Aleve last week  Recent/future dental: no  Any missed doses?: no       Is patient compliant with meds? yes       Current Medications (verified): 1)  Sotalol Hcl 120 Mg Tabs (Sotalol Hcl) .Marland Kitchen.. 1 Tab Two Times A Day 2)  Digoxin 0.125 Mg Tabs (Digoxin) .Marland Kitchen.. 1 Tab Once Daily 3)  Altace 5 Mg Caps (Ramipril) .Marland Kitchen.. 1 Tab Once Daily 4)  Lasix 40 Mg Tabs (Furosemide) .Marland Kitchen.. 1 Tab Two Times A Day 5)  Aspirin 81 Mg Tbec (Aspirin) .... Take One Tablet By Mouth Daily 6)  Potassium Chloride Crys Cr 20 Meq Cr-Tabs (Potassium Chloride Crys Cr) .Marland Kitchen.. 1 Tab Two Times A Day 7)  Synthroid 88 Mcg Tabs (Levothyroxine Sodium) .Marland Kitchen.. 1 Tab Once Daily 8)  Nitroglycerin 0.4 Mg Subl (Nitroglycerin) .... One Tablet Under Tongue Every 5 Minutes As Needed For Chest Pain---May Repeat Times Three 9)  Methotrexate 2.5 Mg Tabs (Methotrexate Sodium) .... 5 Tabs Weekly 10)  Hydroxychloroquine Sulfate 200 Mg Tabs (Hydroxychloroquine Sulfate) .Marland Kitchen.. 1 Tab Two Times A Day 11)  Prednisone 10 Mg Tabs (Prednisone) .Marland Kitchen.. 1 Tab Once Daily 12)  Calcium 600 1500 Mg Tabs (Calcium Carbonate) .Marland Kitchen.. 1 Tab Once Daily 13)  Actonel 150 Mg Tabs (Risedronate Sodium) .Marland Kitchen.. 1 Tab Every Month 14)  Protonix 40 Mg Pack (Pantoprazole Sodium) .Marland Kitchen.. 1 Tab Once Daily 15)  Lyrica 75 Mg Caps (Pregabalin) .Marland Kitchen.. 1 Tab Two Times A Day 16)  Multivitamins   Tabs (Multiple Vitamin) .Marland Kitchen.. 1 Tab Once Daily 17)  Vitamin D 57846 Unit Caps (Ergocalciferol) .Marland Kitchen.. 1 Tab Every Month 18)  Allopurinol 300  Mg Tabs (Allopurinol) .Marland Kitchen.. 1 Tab Once Daily 19)  Coumadin 2.5 Mg Tabs (Warfarin Sodium) .... Sunday - 1 Tab, Monday - 2 Tabs, Tuesday - 1 Tab, Wednesday - 2 Tabs, Thursday - 1 Tab, Friday - 2 Tabs, Saturday - 1 Tab  Allergies (verified): 1)  ! * Butoconazole 2)  ! * Terazole  Anticoagulation Management History:      The patient is taking warfarin and comes in today for a routine follow up visit.  Negative risk factors for bleeding include an age less than 51 years old.  The bleeding index is 'low risk'.  Positive CHADS2 values include History of HTN.  Negative CHADS2 values include Age > 95 years old.  The start date was 09/12/2000.  Prothrombin time is 20.1.  Anticoagulation responsible provider: Eden Emms MD, Theron Arista.  INR POC: 2.8.  Exp: 12/2009.    Anticoagulation Management Assessment/Plan:      The patient's current anticoagulation dose is Coumadin 2.5 mg tabs: Sunday - 1 tab, Monday - 2 tabs, Tuesday - 1 tab, Wednesday - 2 tabs, Thursday - 1 tab, Friday - 2 tabs, Saturday - 1 tab.  The target INR is 2.0-3.0.  The next INR is due 01/27/2009.  Anticoagulation instructions were given to patient.  Results were reviewed/authorized by Leota Sauers, PharmD, BCPS, CPP.  Prior Anticoagulation Instructions: INR today 2.5  Continue with the following schedule of:  Take 2.5mg  (0.5 tablets) everyday except on Mondays, Wednesdays and Fridays take 5 mg (1 tablet).   Current Anticoagulation Instructions: INR 2.8 Continue Coumadin 5mg  MWF and 2.5mg  all other days

## 2010-07-13 NOTE — Medication Information (Signed)
Summary: rov/tm  Anticoagulant Therapy  Managed by: Bethena Midget, RN, BSN Referring MD: Valera Castle MD PCP: Dr. Hunt Oris MD: Tenny Craw MD, Gunnar Fusi Indication 1: Atrial Fibrillation (ICD-427.31) Lab Used: [facility] Site: [facility] INR POC 2.7 INR RANGE 2 - 3  Dietary changes: no    Health status changes: no    Bleeding/hemorrhagic complications: no    Recent/future hospitalizations: no    Any changes in medication regimen? no    Recent/future dental: no  Any missed doses?: no       Is patient compliant with meds? yes       Allergies: 1)  ! * Butoconazole 2)  ! * Terazole  Anticoagulation Management History:      The patient is taking warfarin and comes in today for a routine follow up visit.  Negative risk factors for bleeding include an age less than 33 years old.  The bleeding index is 'low risk'.  Positive CHADS2 values include History of HTN.  Negative CHADS2 values include Age > 44 years old.  The start date was 09/12/2000.  Her last INR was 7.1.  Anticoagulation responsible provider: Tenny Craw MD, Gunnar Fusi.  INR POC: 2.7.  Cuvette Lot#: [id number].  Exp: 03/2010.    Anticoagulation Management Assessment/Plan:      The patient's current anticoagulation dose is Coumadin 5 mg tabs: Take as directed by coumadin clinic..  The target INR is 2.0-3.0.  The next INR is due 05/15/2009.  Anticoagulation instructions were given to patient.  Results were reviewed/authorized by Bethena Midget, RN, BSN.  She was notified by Bethena Midget, RN, BSN.         Prior Anticoagulation Instructions: INR 3.7 Skip today's dose then resume 2.5mg s everyday except 5mg s on Mondays, Wednesdays and Fridays. Recheck in 2 weeks.   Current Anticoagulation Instructions: INR 2.7 Continue 2.5mg s everyday except 5mg s on Mondays, Wednesdays, and Fridays. Recheck in 3 weeks.

## 2010-07-13 NOTE — Letter (Signed)
Summary: Appointment - Reminder 2  [facility], Main Office  [address]   [place], [place] [address]   Phone: [phone]  Fax: [phone]     January 22, 2009 MRN: [id number]   [facility] [address] [place], [place]  [address]   Dear Ms. PREW,  Our records indicate that it is time to schedule a follow-up appointment.  Dr.Wall recommended that you follow up with [place] in Oct 2010. It is very important that we reach you to schedule this appointment. We look forward to participating in your health care needs. Please contact us at the number listed above at your earliest convenience to schedule your appointment.  If you are unable to make an appointment at this time, give [place] a call so we can update our records.     Sincerely,  Lorne Skeens  [facility] Scheduling Team

## 2010-07-13 NOTE — Medication Information (Signed)
Summary: rov/sp  Anticoagulant Therapy  Managed by: Cloyde Reams, RN, BSN Referring MD: Valera Castle MD PCP: Dr. Hunt Oris MD: Johney Frame MD, Fayrene Fearing Indication 1: Atrial Fibrillation (ICD-427.31) Lab Used: [facility] Site: [facility] INR POC 3.7 INR RANGE 2 - 3  Dietary changes: no    Health status changes: no    Bleeding/hemorrhagic complications: no    Recent/future hospitalizations: no    Any changes in medication regimen? yes       Details: Pt was on Fluconazole x 1 week 02/23/09.  Completed.    Recent/future dental: no  Any missed doses?: yes     Details: Holding coumadin at present.    Is patient compliant with meds? yes       Current Medications (verified): 1)  Sotalol Hcl 120 Mg Tabs (Sotalol Hcl) .Marland Kitchen.. 1 Tab Two Times A Day 2)  Digoxin 0.125 Mg Tabs (Digoxin) .Marland Kitchen.. 1 Tab Once Daily 3)  Ramipril 2.5 Mg Caps (Ramipril) .Marland Kitchen.. 1 Once Daily 4)  Lasix 40 Mg Tabs (Furosemide) .Marland Kitchen.. 1 Tab Two Times A Day 5)  Aspirin 81 Mg Tbec (Aspirin) .... Take One Tablet By Mouth Daily 6)  Potassium Chloride Crys Cr 20 Meq Cr-Tabs (Potassium Chloride Crys Cr) .Marland Kitchen.. 1 Tab Two Times A Day 7)  Synthroid 88 Mcg Tabs (Levothyroxine Sodium) .Marland Kitchen.. 1 Tab Once Daily 8)  Nitroglycerin 0.4 Mg Subl (Nitroglycerin) .... One Tablet Under Tongue Every 5 Minutes As Needed For Chest Pain---May Repeat Times Three 9)  Methotrexate 2.5 Mg Tabs (Methotrexate Sodium) .... 5 Tabs Weekly 10)  Hydroxychloroquine Sulfate 200 Mg Tabs (Hydroxychloroquine Sulfate) .Marland Kitchen.. 1 Tab Two Times A Day 11)  Prednisone 10 Mg Tabs (Prednisone) .Marland Kitchen.. 1 Tab Once Daily 12)  Calcium 600 1500 Mg Tabs (Calcium Carbonate) .Marland Kitchen.. 1 Tab Once Daily 13)  Actonel 35 Mg Tabs (Risedronate Sodium) .Marland Kitchen.. 1 Tab Weekly 14)  Protonix 40 Mg Pack (Pantoprazole Sodium) .Marland Kitchen.. 1 Tab Once Daily 15)  Lyrica 75 Mg Caps (Pregabalin) .Marland Kitchen.. 1 Tab Once Daily 16)  Multivitamins   Tabs (Multiple Vitamin) .Marland Kitchen.. 1 Tab Once Daily 17)  Vitamin D 04540 Unit Caps  (Ergocalciferol) .Marland Kitchen.. 1 Tab Every Month 18)  Allopurinol 300 Mg Tabs (Allopurinol) .Marland Kitchen.. 1 Tab Once Daily 19)  Coumadin 5 Mg Tabs (Warfarin Sodium) .... Take As Directed By Coumadin Clinic.  Allergies (verified): 1)  ! * Butoconazole 2)  ! * Terazole  Anticoagulation Management History:      The patient is taking warfarin and comes in today for a routine follow up visit.  Negative risk factors for bleeding include an age less than 53 years old.  The bleeding index is 'low risk'.  Positive CHADS2 values include History of HTN.  Negative CHADS2 values include Age > 11 years old.  The start date was 09/12/2000.  Her last INR was 7.1.  Anticoagulation responsible provider: Cliff Damiani MD, Fayrene Fearing.  INR POC: 3.7.  Exp: 04/2010.    Anticoagulation Management Assessment/Plan:      The patient's current anticoagulation dose is Coumadin 5 mg tabs: Take as directed by coumadin clinic..  The target INR is 2.0-3.0.  The next INR is due 03/20/2009.  Anticoagulation instructions were given to patient.  Results were reviewed/authorized by Cloyde Reams, RN, BSN.  She was notified by Cloyde Reams RN.         Prior Anticoagulation Instructions: INR 7.1  Hold Coumadin until next appointment on 10/1. Spoke with patient on the phone.  She is aware of dosing instructions.  Current Anticoagulation Instructions: INR 3.7  Hold coumadin today then resume 1/2 tablet daily except 1 tablet on Mondays, Wednesdays and Fridays.  Recheck in 1 week.

## 2010-07-13 NOTE — Medication Information (Signed)
Summary: rov/tm  Anticoagulant Therapy  Managed by: Bethena Midget, RN, BSN Referring MD: Valera Castle MD PCP: Dr. Hunt Oris MD: Gala Romney MD, Reuel Boom Indication 1: Atrial Fibrillation (ICD-427.31) Lab Used: [facility] Site: [facility] INR POC 2.6 INR RANGE 2 - 3  Dietary changes: no    Health status changes: no    Bleeding/hemorrhagic complications: no    Recent/future hospitalizations: no    Any changes in medication regimen? no    Recent/future dental: no  Any missed doses?: no       Is patient compliant with meds? yes       Allergies: 1)  ! * Butoconazole 2)  ! * Terazole  Anticoagulation Management History:      The patient is taking warfarin and comes in today for a routine follow up visit.  Negative risk factors for bleeding include an age less than 39 years old.  The bleeding index is 'low risk'.  Positive CHADS2 values include History of HTN.  Negative CHADS2 values include Age > 57 years old.  The start date was 09/12/2000.  Her last INR was 7.1.  Anticoagulation responsible provider: Artemisia Auvil MD, Reuel Boom.  INR POC: 2.6.  Cuvette Lot#: [id number].  Exp: 10/2010.    Anticoagulation Management Assessment/Plan:      The patient's current anticoagulation dose is Coumadin 5 mg tabs: Take as directed by coumadin clinic..  The target INR is 2.0-3.0.  The next INR is due 09/04/2009.  Anticoagulation instructions were given to patient.  Results were reviewed/authorized by Bethena Midget, RN, BSN.  She was notified by Bethena Midget, RN, BSN.         Prior Anticoagulation Instructions: INR 3.0 Today take 2.5mg s then resume 2.5mg s everyday except 5mg s on Mondays, Wednesdays and Fridays. Recheck in 4 weeks.   Current Anticoagulation Instructions: INR 2.6 Continue 2.5mg s daily except 5mg s on Mondays, Wednesdays and Fridays. Recheck in 4 weeks.

## 2010-07-13 NOTE — Medication Information (Signed)
Summary: rov/tm  Anticoagulant Therapy  Managed by: Charolotte Eke, PharmD Referring MD: Valera Castle MD PCP: Dr. Hunt Oris MD: Tenny Craw MD, Gunnar Fusi Indication 1: Atrial Fibrillation (ICD-427.31) Lab Used: [facility] Site: [facility] INR POC 3.2 INR RANGE 2 - 3  Dietary changes: no    Health status changes: no    Bleeding/hemorrhagic complications: no    Recent/future hospitalizations: no    Any changes in medication regimen? yes       Details: PCP holding ramipril due to fatigue and dizziness. lab work pending.  Recent/future dental: no  Any missed doses?: no       Is patient compliant with meds? yes       Current Medications (verified): 1)  Sotalol Hcl 120 Mg Tabs (Sotalol Hcl) .Marland Kitchen.. 1 Tab Two Times A Day 2)  Digoxin 0.125 Mg Tabs (Digoxin) .Marland Kitchen.. 1 Tab Once Daily 3)  Ramipril 2.5 Mg Caps (Ramipril) .Marland Kitchen.. 1 Once Daily 4)  Lasix 40 Mg Tabs (Furosemide) .Marland Kitchen.. 1 Tab Qam..1/2 Tab At Bedtime 5)  Aspirin 81 Mg Tbec (Aspirin) .... Take One Tablet By Mouth Daily 6)  Potassium Chloride Crys Cr 20 Meq Cr-Tabs (Potassium Chloride Crys Cr) .Marland Kitchen.. 1 Tab Two Times A Day 7)  Synthroid 100 Mcg Tabs (Levothyroxine Sodium) .Marland Kitchen.. 1 Tab Once Daily 8)  Nitroglycerin 0.4 Mg Subl (Nitroglycerin) .... One Tablet Under Tongue Every 5 Minutes As Needed For Chest Pain---May Repeat Times Three 9)  Methotrexate 2.5 Mg Tabs (Methotrexate Sodium) .... 5 Tabs Weekly 10)  Hydroxychloroquine Sulfate 200 Mg Tabs (Hydroxychloroquine Sulfate) .Marland Kitchen.. 1 Tab Two Times A Day 11)  Prednisone 10 Mg Tabs (Prednisone) .Marland Kitchen.. 1 Tab Once Daily 12)  Calcium 600 1500 Mg Tabs (Calcium Carbonate) .Marland Kitchen.. 1 Tab Once Daily 13)  Actonel 150 Mg Tabs (Risedronate Sodium) .Marland Kitchen.. 1 Tab Monthly 14)  Protonix 40 Mg Pack (Pantoprazole Sodium) .Marland Kitchen.. 1 Tab Once Daily 15)  Multivitamins   Tabs (Multiple Vitamin) .Marland Kitchen.. 1 Tab Once Daily 16)  Vitamin D 10932 Unit Caps (Ergocalciferol) .Marland Kitchen.. 1 Tab Every Month 17)  Allopurinol 300 Mg Tabs (Allopurinol)  .Marland Kitchen.. 1 Tab Once Daily 18)  Coumadin 5 Mg Tabs (Warfarin Sodium) .... Take As Directed By Coumadin Clinic. 19)  Vitamin D3 2000 Unit Caps (Cholecalciferol) .Marland Kitchen.. 1 Tab Once Daily  Allergies (verified): 1)  ! * Butoconazole 2)  ! * Terazole  Anticoagulation Management History:      The patient is taking warfarin and comes in today for a routine follow up visit.  Negative risk factors for bleeding include an age less than 30 years old.  The bleeding index is 'low risk'.  Positive CHADS2 values include History of HTN.  Negative CHADS2 values include Age > 2 years old.  The start date was 09/12/2000.  Her last INR was 7.1.  Anticoagulation responsible provider: Tenny Craw MD, Gunnar Fusi.  INR POC: 3.2.  Cuvette Lot#: [id number].  Exp: 03/2011.    Anticoagulation Management Assessment/Plan:      The patient's current anticoagulation dose is Coumadin 5 mg tabs: Take as directed by coumadin clinic..  The target INR is 2.0-3.0.  The next INR is due 03/04/2010.  Anticoagulation instructions were given to patient.  Results were reviewed/authorized by Charolotte Eke, PharmD.  She was notified by Charolotte Eke, PharmD.         Prior Anticoagulation Instructions: INR 2.9 Continue 2.5mg s everyday excepet 5mg s on Mondays, Wednesdays and Fridays. Recheck in 4 weeks.   Current Anticoagulation Instructions: Hold today's dose then resume previous:  2.5mg  daily except 5mg  Mon, Wed, Fri.

## 2010-07-13 NOTE — Medication Information (Signed)
Summary: rov  Anticoagulant Therapy  Managed by: Hoy Register Referring MD: Valera Castle MD Supervising MD: Eden Emms MD, Theron Arista Indication 1: Atrial Fibrillation (ICD-427.31) Lab Used: [facility] 19.2 INR POC 2.5  Dietary changes: yes       Details: had extra greens this week  Health status changes: no    Bleeding/hemorrhagic complications: no    Recent/future hospitalizations: no    Any changes in medication regimen? no    Recent/future dental: no  Any missed doses?: no       Is patient compliant with meds? yes       Allergies: 1)  ! * Butoconazole 2)  ! * Terazole  Anticoagulation Management History:      The patient is on coumadin and comes in today for a routine follow up visit.  Negative risk factors for bleeding include an age less than 84 years old.  The bleeding index is 'low risk'.  Positive CHADS2 values include History of HTN.  Negative CHADS2 values include Age > 11 years old.  The start date was 09/12/2000.    Anticoagulation Management Assessment/Plan:      The patient's current anticoagulation dose is Coumadin 2.5 mg tabs: Sunday - 1 tab, Monday - 2 tabs, Tuesday - 1 tab, Wednesday - 2 tabs, Thursday - 1 tab, Friday - 2 tabs, Saturday - 1 tab.  The target INR is 2.0-3.0.  She is to have a 12/30/2008.  Anticoagulation instructions were given to patient.  Results were reviewed/authorized by Hoy Register.  She was notified by Mayo Ao Candidate.         Prior Anticoagulation Instructions: The patient is to continue with the same dose of coumadin.  This dosage includes:  Coumadin 2.5 mg tabs: Sunday - 1 tab, Monday - 2 tabs, Tuesday - 1 tab, Wednesday - 2 tabs, Thursday - 1 tab, Friday - 2 tabs, Saturday - 1 tab.    Current Anticoagulation Instructions: INR today 2.5  Continue with the following schedule of:  Take 2.5mg  (0.5 tablets) everyday except on Mondays, Wednesdays and Fridays take 5 mg (1 tablet).

## 2010-07-13 NOTE — Medication Information (Signed)
Summary: rov/sl  Anticoagulant Therapy  Managed by: Bethena Midget, RN, BSN Referring MD: Valera Castle MD PCP: Dr. Hunt Oris MD: Myrtis Ser MD, Tinnie Gens Indication 1: Atrial Fibrillation (ICD-427.31) Lab Used: [facility] Site: [facility] INR POC 2.8 INR RANGE 2 - 3  Dietary changes: no    Health status changes: no    Bleeding/hemorrhagic complications: no    Recent/future hospitalizations: no    Any changes in medication regimen? yes       Details: Restarting OTC MVI   Recent/future dental: no  Any missed doses?: no       Is patient compliant with meds? yes       Allergies: 1)  ! * Butoconazole 2)  ! * Terazole  Anticoagulation Management History:      The patient is taking warfarin and comes in today for a routine follow up visit.  Negative risk factors for bleeding include an age less than 39 years old.  The bleeding index is 'low risk'.  Positive CHADS2 values include History of HTN.  Negative CHADS2 values include Age > 30 years old.  The start date was 09/12/2000.  Her last INR was 7.1.  Anticoagulation responsible provider: Myrtis Ser MD, Tinnie Gens.  INR POC: 2.8.  Cuvette Lot#: [id number].  Exp: 04/2011.    Anticoagulation Management Assessment/Plan:      The patient's current anticoagulation dose is Coumadin 5 mg tabs: Take as directed by coumadin clinic..  The target INR is 2.0-3.0.  The next INR is due 05/14/2010.  Anticoagulation instructions were given to patient.  Results were reviewed/authorized by Bethena Midget, RN, BSN.  She was notified by Bethena Midget, RN, BSN.         Prior Anticoagulation Instructions: INR 4.1  Do not take Coumadin on Friday, October 21st or Saturday, October 22nd. Then, resume taking Coumadin 2.5 mg (0.5 tab) on Sun, Tues, Thur, Sat and Coumadin 5 mg (1 tab) on Mon, Wed, Fri. Return to clinic in 2 weeks.    Current Anticoagulation Instructions: INR 2.8 Continue 2.5mg s everyday except 5mg s on Mondays, Wednesdays and Fridays. Recheck in 4  weeks.

## 2010-07-13 NOTE — Assessment & Plan Note (Signed)
Summary: 6 MO F/U  Medications Added LASIX 40 MG TABS (FUROSEMIDE) 1 tab qam..1/2 tab at bedtime SYNTHROID 100 MCG TABS (LEVOTHYROXINE SODIUM) 1 tab once daily ACTONEL 150 MG TABS (RISEDRONATE SODIUM) 1 tab weekly TRAMADOL HCL 50 MG TABS (TRAMADOL HCL) 1 tab every 6 hours as needed VITAMIN D3 2000 UNIT CAPS (CHOLECALCIFEROL) 1 tab once daily        Visit Type:  6 mo f/u Primary Provider:  Dr. Ricki Miller  CC:  pt states she has had right leg pain x 1 month ago....edema/legs...chest tightness..denies any sob.  History of Present Illness: Mrs. [name] returns today for evaluation management of her fairly complex cardiovascular history. Please see the assessment and plan for problem list.  She's having no palpitations, no angina, mild dyspnea on exertion which is baseline, no orthopnea, no edema. She's had no problems with bleeding.  Her biggest problem has been sciatica necessitating even a steroid treatment.  She recently had some leg pain and had venous Dopplers. There was no sign of clot. She is having her back pain evaluated and worked up at present by her primary care.  Current Medications (verified): 1)  Sotalol Hcl 120 Mg Tabs (Sotalol Hcl) .Marland Kitchen.. 1 Tab Two Times A Day 2)  Digoxin 0.125 Mg Tabs (Digoxin) .Marland Kitchen.. 1 Tab Once Daily 3)  Ramipril 2.5 Mg Caps (Ramipril) .Marland Kitchen.. 1 Once Daily 4)  Lasix 40 Mg Tabs (Furosemide) .Marland Kitchen.. 1 Tab Qam..1/2 Tab At Bedtime 5)  Aspirin 81 Mg Tbec (Aspirin) .... Take One Tablet By Mouth Daily 6)  Potassium Chloride Crys Cr 20 Meq Cr-Tabs (Potassium Chloride Crys Cr) .Marland Kitchen.. 1 Tab Two Times A Day 7)  Synthroid 100 Mcg Tabs (Levothyroxine Sodium) .Marland Kitchen.. 1 Tab Once Daily 8)  Nitroglycerin 0.4 Mg Subl (Nitroglycerin) .... One Tablet Under Tongue Every 5 Minutes As Needed For Chest Pain---May Repeat Times Three 9)  Methotrexate 2.5 Mg Tabs (Methotrexate Sodium) .... 5 Tabs Weekly 10)  Hydroxychloroquine Sulfate 200 Mg Tabs (Hydroxychloroquine Sulfate) .Marland Kitchen.. 1 Tab Two Times A  Day 11)  Prednisone 10 Mg Tabs (Prednisone) .Marland Kitchen.. 1 Tab Once Daily 12)  Calcium 600 1500 Mg Tabs (Calcium Carbonate) .Marland Kitchen.. 1 Tab Once Daily 13)  Actonel 150 Mg Tabs (Risedronate Sodium) .Marland Kitchen.. 1 Tab Weekly 14)  Protonix 40 Mg Pack (Pantoprazole Sodium) .Marland Kitchen.. 1 Tab Once Daily 15)  Lyrica 75 Mg Caps (Pregabalin) .Marland Kitchen.. 1 Tab Once Daily 16)  Multivitamins   Tabs (Multiple Vitamin) .Marland Kitchen.. 1 Tab Once Daily 17)  Vitamin D 16109 Unit Caps (Ergocalciferol) .Marland Kitchen.. 1 Tab Every Month 18)  Allopurinol 300 Mg Tabs (Allopurinol) .Marland Kitchen.. 1 Tab Once Daily 19)  Coumadin 5 Mg Tabs (Warfarin Sodium) .... Take As Directed By Coumadin Clinic. 20)  Tramadol Hcl 50 Mg Tabs (Tramadol Hcl) .Marland Kitchen.. 1 Tab Every 6 Hours As Needed 21)  Vitamin D3 2000 Unit Caps (Cholecalciferol) .Marland Kitchen.. 1 Tab Once Daily  Allergies: 1)  ! * Butoconazole 2)  ! Dierdre Forth  Past History:  Past Medical History: Last updated: 09/20/2008 ATRIAL FIBRILLATION (ICD-427.31) COUMADIN THERAPY (ICD-V58.61) HYPERTENSION, UNSPECIFIED (ICD-401.9) DYSPNEA ON EXERTION (ICD-786.09) LEG EDEMA, BILATERAL (ICD-782.3)    Past Surgical History: Last updated: 09/20/2008 noncontributory  Family History: Last updated: 09/20/2008 Siblings: Brother had MI in his 45's Family History of Coronary Artery Disease:   Social History: Last updated: 09/20/2008 Tobacco Use - No.  Alcohol Use - no  Risk Factors: Smoking Status: never (09/20/2008)  Review of Systems       negative other than history  of present illness  Vital Signs:  Patient profile:   65 year old female Height:      60 inches Weight:      245 pounds BMI:     48.02 Pulse rate:   60 / minute Pulse rhythm:   irregular BP sitting:   100 / 60  (left arm) Cuff size:   large  Vitals Entered By: Danielle Rankin, CMA (August 14, 2009 11:58 AM)  Physical Exam  General:  obese.   Head:  normocephalic and atraumatic Eyes:  PERRLA/EOM intact; conjunctiva and lids normal. Neck:  Neck supple, no JVD. No  masses, thyromegaly or abnormal cervical nodes. Lungs:  Clear bilaterally to auscultation and percussion. Heart:  regular rate and rhythm, no gallop or murmur. Normal S1-S2 Msk:  decreased ROM.   Pulses:  diminished but present the lower extremities Extremities:  1+ left pedal edema and 1+ right pedal edema.   Neurologic:  Alert and oriented x 3. Skin:  Intact without lesions or rashes. Psych:  Normal affect.   EKG  Procedure date:  08/14/2009  Findings:      normal sinus rhythm, QTC of 470 which is relatively stable.  Impression & Recommendations:  Problem # 1:  ATRIAL FIBRILLATION (ICD-427.31)  Her updated medication list for this problem includes:    Sotalol Hcl 120 Mg Tabs (Sotalol hcl) .Marland Kitchen... 1 tab two times a day    Digoxin 0.125 Mg Tabs (Digoxin) .Marland Kitchen... 1 tab once daily    Aspirin 81 Mg Tbec (Aspirin) .Marland Kitchen... Take one tablet by mouth daily    Coumadin 5 Mg Tabs (Warfarin sodium) .Marland Kitchen... Take as directed by coumadin clinic.  Orders: EKG w/ Interpretation (93000)  Problem # 2:  CAD, NATIVE VESSEL (ICD-414.01)  Her updated medication list for this problem includes:    Sotalol Hcl 120 Mg Tabs (Sotalol hcl) .Marland Kitchen... 1 tab two times a day    Ramipril 2.5 Mg Caps (Ramipril) .Marland Kitchen... 1 once daily    Aspirin 81 Mg Tbec (Aspirin) .Marland Kitchen... Take one tablet by mouth daily    Nitroglycerin 0.4 Mg Subl (Nitroglycerin) ..... One tablet under tongue every 5 minutes as needed for chest pain---may repeat times three    Coumadin 5 Mg Tabs (Warfarin sodium) .Marland Kitchen... Take as directed by coumadin clinic.  Orders: EKG w/ Interpretation (93000)  Problem # 3:  COUMADIN THERAPY (ICD-V58.61)  Problem # 4:  HYPERTENSION, UNSPECIFIED (ICD-401.9)  Her updated medication list for this problem includes:    Sotalol Hcl 120 Mg Tabs (Sotalol hcl) .Marland Kitchen... 1 tab two times a day    Ramipril 2.5 Mg Caps (Ramipril) .Marland Kitchen... 1 once daily    Lasix 40 Mg Tabs (Furosemide) .Marland Kitchen... 1 tab qam..1/2 tab at bedtime    Aspirin 81 Mg  Tbec (Aspirin) .Marland Kitchen... Take one tablet by mouth daily  Problem # 5:  LEG EDEMA, BILATERAL (ICD-782.3)  Patient Instructions: 1)  Your physician recommends that you schedule a follow-up appointment in: 6 MONTHS WITH DR Sadonna Kotara 2)  Your physician recommends that you continue on your current medications as directed. Please refer to the Current Medication list given to you today.

## 2010-07-13 NOTE — Medication Information (Signed)
Summary: rov-tp  Anticoagulant Therapy  Managed by: Weston Brass, PharmD Referring MD: Valera Castle MD PCP: Dr. Hunt Oris MD: Gala Romney MD, Reuel Boom Indication 1: Atrial Fibrillation (ICD-427.31) Lab Used: [facility] Site: [facility] INR POC 2.9 INR RANGE 2 - 3  Dietary changes: no    Health status changes: no    Bleeding/hemorrhagic complications: no    Recent/future hospitalizations: no    Any changes in medication regimen? yes       Details: cipro x 3 days for UTI the end of August, d/c ramipril  Recent/future dental: no  Any missed doses?: no       Is patient compliant with meds? yes       Allergies: 1)  ! * Butoconazole 2)  ! * Terazole  Anticoagulation Management History:      The patient is taking warfarin and comes in today for a routine follow up visit.  Negative risk factors for bleeding include an age less than 30 years old.  The bleeding index is 'low risk'.  Positive CHADS2 values include History of HTN.  Negative CHADS2 values include Age > 66 years old.  The start date was 09/12/2000.  Her last INR was 7.1.  Anticoagulation responsible provider: Finas Delone MD, Reuel Boom.  INR POC: 2.9.  Cuvette Lot#: [id number].  Exp: 04/2011.    Anticoagulation Management Assessment/Plan:      The patient's current anticoagulation dose is Coumadin 5 mg tabs: Take as directed by coumadin clinic..  The target INR is 2.0-3.0.  The next INR is due 04/02/2010.  Anticoagulation instructions were given to patient.  Results were reviewed/authorized by Weston Brass, PharmD.  She was notified by Harrel Carina, PharmD candidate.         Prior Anticoagulation Instructions: Hold today's dose then resume previous: 2.5mg  daily except 5mg  Mon, Wed, Fri.  Current Anticoagulation Instructions: INR 2.9  Continue taking 1/2 tablet everyday except take 1 tablet on Mondays, Wednesdays, and Fridays. Re-check INR in 4 weeks.

## 2010-07-13 NOTE — Medication Information (Signed)
Summary: rov/ewj  Anticoagulant Therapy  Managed by: Eda Keys, PharmD Referring MD: Valera Castle MD Supervising MD: Riley Kill MD, Maisie Fus Indication 1: Atrial Fibrillation (ICD-427.31) Lab Used: [facility] Site: [facility] INR POC 2.0 INR RANGE 2 - 3  Dietary changes: yes       Details: possibly less green vegetables  Health status changes: no    Bleeding/hemorrhagic complications: no    Recent/future hospitalizations: no    Any changes in medication regimen? yes       Details: generic protonix added  Recent/future dental: yes     Details: Friday (9/10) having tooth extracted.  Any missed doses?: no       Is patient compliant with meds? yes       Current Medications (verified): 1)  Sotalol Hcl 120 Mg Tabs (Sotalol Hcl) .Marland Kitchen.. 1 Tab Two Times A Day 2)  Digoxin 0.125 Mg Tabs (Digoxin) .Marland Kitchen.. 1 Tab Once Daily 3)  Altace 5 Mg Caps (Ramipril) .Marland Kitchen.. 1 Tab Once Daily 4)  Lasix 40 Mg Tabs (Furosemide) .Marland Kitchen.. 1 Tab Two Times A Day 5)  Aspirin 81 Mg Tbec (Aspirin) .... Take One Tablet By Mouth Daily 6)  Potassium Chloride Crys Cr 20 Meq Cr-Tabs (Potassium Chloride Crys Cr) .Marland Kitchen.. 1 Tab Two Times A Day 7)  Synthroid 88 Mcg Tabs (Levothyroxine Sodium) .Marland Kitchen.. 1 Tab Once Daily 8)  Nitroglycerin 0.4 Mg Subl (Nitroglycerin) .... One Tablet Under Tongue Every 5 Minutes As Needed For Chest Pain---May Repeat Times Three 9)  Methotrexate 2.5 Mg Tabs (Methotrexate Sodium) .... 5 Tabs Weekly 10)  Hydroxychloroquine Sulfate 200 Mg Tabs (Hydroxychloroquine Sulfate) .Marland Kitchen.. 1 Tab Two Times A Day 11)  Prednisone 10 Mg Tabs (Prednisone) .Marland Kitchen.. 1 Tab Once Daily 12)  Calcium 600 1500 Mg Tabs (Calcium Carbonate) .Marland Kitchen.. 1 Tab Once Daily 13)  Actonel 150 Mg Tabs (Risedronate Sodium) .Marland Kitchen.. 1 Tab Every Month 14)  Protonix 40 Mg Pack (Pantoprazole Sodium) .Marland Kitchen.. 1 Tab Once Daily 15)  Lyrica 75 Mg Caps (Pregabalin) .Marland Kitchen.. 1 Tab Two Times A Day 16)  Multivitamins   Tabs (Multiple Vitamin) .Marland Kitchen.. 1 Tab Once Daily 17)  Vitamin  D 50000 Unit Caps (Ergocalciferol) .Marland Kitchen.. 1 Tab Every Month 18)  Allopurinol 300 Mg Tabs (Allopurinol) .Marland Kitchen.. 1 Tab Once Daily 19)  Coumadin 2.5 Mg Tabs (Warfarin Sodium) .... Sunday - 1 Tab, Monday - 2 Tabs, Tuesday - 1 Tab, Wednesday - 2 Tabs, Thursday - 1 Tab, Friday - 2 Tabs, Saturday - 1 Tab  Allergies (verified): 1)  ! * Butoconazole 2)  ! * Terazole  Anticoagulation Management History:      The patient is taking warfarin and comes in today for a routine follow up visit.  Negative risk factors for bleeding include an age less than 27 years old.  The bleeding index is 'low risk'.  Positive CHADS2 values include History of HTN.  Negative CHADS2 values include Age > 64 years old.  The start date was 09/12/2000.  Anticoagulation responsible provider: Riley Kill MD, Maisie Fus.  INR POC: 2.0.  Cuvette Lot#: [id number].  Exp: 03/2010.    Anticoagulation Management Assessment/Plan:      The patient's current anticoagulation dose is Coumadin 2.5 mg tabs: Sunday - 1 tab, Monday - 2 tabs, Tuesday - 1 tab, Wednesday - 2 tabs, Thursday - 1 tab, Friday - 2 tabs, Saturday - 1 tab.  The target INR is 2.0-3.0.  The next INR is due 03/10/2009.  Anticoagulation instructions were given to patient.  Results were reviewed/authorized by  Eda Keys, PharmD.  She was notified by Eda Keys, PharmD.         Prior Anticoagulation Instructions: INR today is 1.9 Take 5mg  tablet today then continue on the same regimen. Recheck in 4 weeks  Current Anticoagulation Instructions: INR 2.0  Continue current dosing schedule. Take 5 mg on Monday, Wednesday, and Friday and take 2.5 mg all other days.

## 2010-07-13 NOTE — Medication Information (Signed)
Summary: rov/ewj  Anticoagulant Therapy  Managed by: Eda Keys, PharmD Referring MD: Valera Castle MD Supervising MD: Riley Kill MD, Maisie Fus Indication 1: Atrial Fibrillation (ICD-427.31) Lab Used: [facility] Site: [facility] INR RANGE 2 - 3  Dietary changes: yes       Details: has eaten less since oral surgery  Health status changes: no    Bleeding/hemorrhagic complications: no    Recent/future hospitalizations: no    Any changes in medication regimen? no    Recent/future dental: no  Any missed doses?: no       Is patient compliant with meds? yes      Comments: oral surgery was Friday Sep 10th and pt did not go off coumadin.  pt did eat mainly jello, etc during the first week after the surgery.  Cell - [id number]  Current Medications (verified): 1)  Sotalol Hcl 120 Mg Tabs (Sotalol Hcl) .Marland Kitchen.. 1 Tab Two Times A Day 2)  Digoxin 0.125 Mg Tabs (Digoxin) .Marland Kitchen.. 1 Tab Once Daily 3)  Altace 5 Mg Caps (Ramipril) .Marland Kitchen.. 1 Tab Once Daily 4)  Lasix 40 Mg Tabs (Furosemide) .Marland Kitchen.. 1 Tab Two Times A Day 5)  Aspirin 81 Mg Tbec (Aspirin) .... Take One Tablet By Mouth Daily 6)  Potassium Chloride Crys Cr 20 Meq Cr-Tabs (Potassium Chloride Crys Cr) .Marland Kitchen.. 1 Tab Two Times A Day 7)  Synthroid 88 Mcg Tabs (Levothyroxine Sodium) .Marland Kitchen.. 1 Tab Once Daily 8)  Nitroglycerin 0.4 Mg Subl (Nitroglycerin) .... One Tablet Under Tongue Every 5 Minutes As Needed For Chest Pain---May Repeat Times Three 9)  Methotrexate 2.5 Mg Tabs (Methotrexate Sodium) .... 5 Tabs Weekly 10)  Hydroxychloroquine Sulfate 200 Mg Tabs (Hydroxychloroquine Sulfate) .Marland Kitchen.. 1 Tab Two Times A Day 11)  Prednisone 10 Mg Tabs (Prednisone) .Marland Kitchen.. 1 Tab Once Daily 12)  Calcium 600 1500 Mg Tabs (Calcium Carbonate) .Marland Kitchen.. 1 Tab Once Daily 13)  Actonel 150 Mg Tabs (Risedronate Sodium) .Marland Kitchen.. 1 Tab Every Month 14)  Protonix 40 Mg Pack (Pantoprazole Sodium) .Marland Kitchen.. 1 Tab Once Daily 15)  Lyrica 75 Mg Caps (Pregabalin) .Marland Kitchen.. 1 Tab Two Times A Day 16)   Multivitamins   Tabs (Multiple Vitamin) .Marland Kitchen.. 1 Tab Once Daily 17)  Vitamin D 28413 Unit Caps (Ergocalciferol) .Marland Kitchen.. 1 Tab Every Month 18)  Allopurinol 300 Mg Tabs (Allopurinol) .Marland Kitchen.. 1 Tab Once Daily 19)  Coumadin 5 Mg Tabs (Warfarin Sodium) .... Take As Directed By Coumadin Clinic.  Allergies (verified): 1)  ! * Butoconazole 2)  ! * Terazole  Anticoagulation Management History:      The patient is taking warfarin and comes in today for a routine follow up visit.  Negative risk factors for bleeding include an age less than 29 years old.  The bleeding index is 'low risk'.  Positive CHADS2 values include History of HTN.  Negative CHADS2 values include Age > 4 years old.  The start date was 09/12/2000.  Anticoagulation responsible provider: Riley Kill MD, Maisie Fus.  Cuvette Lot#: [id number].  Exp: 04/2010.    Anticoagulation Management Assessment/Plan:      The patient's current anticoagulation dose is Coumadin 5 mg tabs: Take as directed by coumadin clinic..  The target INR is 2.0-3.0.  The next INR is due 03/10/2009.  Anticoagulation instructions were given to patient.  Results were reviewed/authorized by Eda Keys, PharmD.         Prior Anticoagulation Instructions: INR 2.0  Continue current dosing schedule. Take 5 mg on Monday, Wednesday, and Friday and take 2.5 mg all other  days.  Current Anticoagulation Instructions: Pt's lab was not drawn today.  Lab has already contacted pt and she is going to come back in the morning to have PT/INR drawn. Spoke with pt.  Will hold Coumadin tonight and redose based on tomorrow's result. Weston Brass PharmD  March 10, 2009 4:27 PM  Please see above information.  Lab was drawn and Theodore Demark contacted and spoke with patient.  She is to return to Coumadin clinic in the morning.  Appended Document: Coumadin Clinic    Anticoagulant Therapy  Managed by: Weston Brass, PharmD Referring MD: Valera Castle MD PCP: Dr. Hunt Oris MD: Riley Kill MD,  Maisie Fus Indication 1: Atrial Fibrillation (ICD-427.31) Lab Used: [facility] Site: [facility] INR POC 6.7 INR RANGE 2 - 3      Any changes in medication regimen? yes       Details: Pt was given course of fluconazole for oral thrush after surgery      Allergies: 1)  ! * Butoconazole 2)  ! Dierdre Forth  Anticoagulation Management History:      Negative risk factors for bleeding include an age less than 58 years old.  The bleeding index is 'low risk'.  Positive CHADS2 values include History of HTN.  Negative CHADS2 values include Age > 69 years old.  The start date was 09/12/2000.  Her last INR was 7.1 ratio and today's INR is 7.1.  Anticoagulation responsible provider: Riley Kill MD, Maisie Fus.  INR POC: 6.7.  Exp: 04/2010.    Anticoagulation Management Assessment/Plan:      The patient's current anticoagulation dose is Coumadin 5 mg tabs: Take as directed by coumadin clinic..  The target INR is 2.0-3.0.  The next INR is due 03/13/2009.  Anticoagulation instructions were given to patient.  Results were reviewed/authorized by Weston Brass, PharmD.  She was notified by Weston Brass PharmD.         Prior Anticoagulation Instructions: Pt's lab was not drawn today.  Lab has already contacted pt and she is going to come back in the morning to have PT/INR drawn. Spoke with pt.  Will hold Coumadin tonight and redose based on tomorrow's result. Weston Brass PharmD  March 10, 2009 4:27 PM   Current Anticoagulation Instructions: INR 7.1  Hold Coumadin until next appointment on 10/1. Spoke with patient on the phone.  She is aware of dosing instructions.

## 2010-07-13 NOTE — Medication Information (Signed)
Summary: rov/ewj  Anticoagulant Therapy  Managed by: Cloyde Reams, RN, BSN Referring MD: Valera Castle MD PCP: Dr. Hunt Oris MD: Ladona Ridgel MD, Sharlot Gowda Indication 1: Atrial Fibrillation (ICD-427.31) Lab Used: [facility] Site: [facility] INR POC 2.8 INR RANGE 2 - 3  Dietary changes: no    Health status changes: no    Bleeding/hemorrhagic complications: no    Recent/future hospitalizations: no    Any changes in medication regimen? no    Recent/future dental: no  Any missed doses?: no       Is patient compliant with meds? yes       Current Medications (verified): 1)  Sotalol Hcl 120 Mg Tabs (Sotalol Hcl) .Marland Kitchen.. 1 Tab Two Times A Day 2)  Digoxin 0.125 Mg Tabs (Digoxin) .Marland Kitchen.. 1 Tab Once Daily 3)  Ramipril 2.5 Mg Caps (Ramipril) .Marland Kitchen.. 1 Once Daily 4)  Lasix 40 Mg Tabs (Furosemide) .Marland Kitchen.. 1 Tab Two Times A Day 5)  Aspirin 81 Mg Tbec (Aspirin) .... Take One Tablet By Mouth Daily 6)  Potassium Chloride Crys Cr 20 Meq Cr-Tabs (Potassium Chloride Crys Cr) .Marland Kitchen.. 1 Tab Two Times A Day 7)  Synthroid 88 Mcg Tabs (Levothyroxine Sodium) .Marland Kitchen.. 1 Tab Once Daily 8)  Nitroglycerin 0.4 Mg Subl (Nitroglycerin) .... One Tablet Under Tongue Every 5 Minutes As Needed For Chest Pain---May Repeat Times Three 9)  Methotrexate 2.5 Mg Tabs (Methotrexate Sodium) .... 5 Tabs Weekly 10)  Hydroxychloroquine Sulfate 200 Mg Tabs (Hydroxychloroquine Sulfate) .Marland Kitchen.. 1 Tab Two Times A Day 11)  Prednisone 10 Mg Tabs (Prednisone) .Marland Kitchen.. 1 Tab Once Daily 12)  Calcium 600 1500 Mg Tabs (Calcium Carbonate) .Marland Kitchen.. 1 Tab Once Daily 13)  Actonel 35 Mg Tabs (Risedronate Sodium) .Marland Kitchen.. 1 Tab Weekly 14)  Protonix 40 Mg Pack (Pantoprazole Sodium) .Marland Kitchen.. 1 Tab Once Daily 15)  Lyrica 75 Mg Caps (Pregabalin) .Marland Kitchen.. 1 Tab Once Daily 16)  Multivitamins   Tabs (Multiple Vitamin) .Marland Kitchen.. 1 Tab Once Daily 17)  Vitamin D 16109 Unit Caps (Ergocalciferol) .Marland Kitchen.. 1 Tab Every Month 18)  Allopurinol 300 Mg Tabs (Allopurinol) .Marland Kitchen.. 1 Tab Once Daily 19)   Coumadin 5 Mg Tabs (Warfarin Sodium) .... Take As Directed By Coumadin Clinic.  Allergies (verified): 1)  ! * Butoconazole 2)  ! * Terazole  Anticoagulation Management History:      The patient is taking warfarin and comes in today for a routine follow up visit.  Negative risk factors for bleeding include an age less than 17 years old.  The bleeding index is 'low risk'.  Positive CHADS2 values include History of HTN.  Negative CHADS2 values include Age > 39 years old.  The start date was 09/12/2000.  Her last INR was 7.1.  Anticoagulation responsible provider: Ladona Ridgel MD, Sharlot Gowda.  INR POC: 2.8.  Cuvette Lot#: [id number].  Exp: 04/2010.    Anticoagulation Management Assessment/Plan:      The patient's current anticoagulation dose is Coumadin 5 mg tabs: Take as directed by coumadin clinic..  The target INR is 2.0-3.0.  The next INR is due 04/10/2009.  Anticoagulation instructions were given to patient.  Results were reviewed/authorized by Cloyde Reams, RN, BSN.  She was notified by Cloyde Reams RN.         Prior Anticoagulation Instructions: INR 3.7  Hold coumadin today then resume 1/2 tablet daily except 1 tablet on Mondays, Wednesdays and Fridays.  Recheck in 1 week.    Current Anticoagulation Instructions: INR 2.8  Continue on same dosage 1/2 tablet daily except 1  tablet on Mondays, Wednesdays, and Fridays.  Recheck in 3 weeks.

## 2010-07-13 NOTE — Assessment & Plan Note (Signed)
Summary: 6 mo f/u ./cy  Medications Added NITROGLYCERIN 0.4 MG SUBL (NITROGLYCERIN) One tablet under tongue every 5 minutes as needed for chest pain---may repeat times three        Visit Type:  6 mo f/u Primary Provider:  Dr. Ricki Miller  CC:  edema/feet/legs....no other complaints today.  History of Present Illness: [name] returns today for evaluation of her coronary disease and atrial fib.  She said no symptoms of angina or ischemia. Her mobility is indeed limited. Apparently she was having some dizziness and one of her hypertensive medicines were stopped by primary care. Her pressures good today at 140/80.  She's had no symptoms of recurrent atrial fibrillation. She is pleased with the response she's had.  Current Medications (verified): 1)  Sotalol Hcl 120 Mg Tabs (Sotalol Hcl) .Marland Kitchen.. 1 Tab Two Times A Day 2)  Digoxin 0.125 Mg Tabs (Digoxin) .Marland Kitchen.. 1 Tab Once Daily 3)  Lasix 40 Mg Tabs (Furosemide) .Marland Kitchen.. 1 Tab Qam..1/2 Tab At Bedtime 4)  Aspirin 81 Mg Tbec (Aspirin) .... Take One Tablet By Mouth Daily 5)  Potassium Chloride Crys Cr 20 Meq Cr-Tabs (Potassium Chloride Crys Cr) .Marland Kitchen.. 1 Tab Two Times A Day 6)  Synthroid 100 Mcg Tabs (Levothyroxine Sodium) .Marland Kitchen.. 1 Tab Once Daily 7)  Nitroglycerin 0.4 Mg Subl (Nitroglycerin) .... One Tablet Under Tongue Every 5 Minutes As Needed For Chest Pain---May Repeat Times Three 8)  Methotrexate 2.5 Mg Tabs (Methotrexate Sodium) .... 5 Tabs Weekly 9)  Hydroxychloroquine Sulfate 200 Mg Tabs (Hydroxychloroquine Sulfate) .Marland Kitchen.. 1 Tab Two Times A Day 10)  Prednisone 10 Mg Tabs (Prednisone) .Marland Kitchen.. 1 Tab Once Daily 11)  Calcium 600 1500 Mg Tabs (Calcium Carbonate) .Marland Kitchen.. 1 Tab Once Daily 12)  Actonel 150 Mg Tabs (Risedronate Sodium) .Marland Kitchen.. 1 Tab Monthly 13)  Protonix 40 Mg Pack (Pantoprazole Sodium) .Marland Kitchen.. 1 Tab Once Daily 14)  Multivitamins   Tabs (Multiple Vitamin) .Marland Kitchen.. 1 Tab Once Daily 15)  Vitamin D 78469 Unit Caps (Ergocalciferol) .Marland Kitchen.. 1 Tab Every Month 16)   Allopurinol 300 Mg Tabs (Allopurinol) .Marland Kitchen.. 1 Tab Once Daily 17)  Coumadin 5 Mg Tabs (Warfarin Sodium) .... Take As Directed By Coumadin Clinic. 18)  Vitamin D3 2000 Unit Caps (Cholecalciferol) .Marland Kitchen.. 1 Tab Once Daily  Allergies: 1)  ! * Butoconazole 2)  ! Dierdre Forth  Past History:  Past Medical History: Last updated: 09/20/2008 ATRIAL FIBRILLATION (ICD-427.31) COUMADIN THERAPY (ICD-V58.61) HYPERTENSION, UNSPECIFIED (ICD-401.9) DYSPNEA ON EXERTION (ICD-786.09) LEG EDEMA, BILATERAL (ICD-782.3)    Past Surgical History: Last updated: 09/20/2008 noncontributory  Family History: Last updated: 09/20/2008 Siblings: Brother had MI in his 61's Family History of Coronary Artery Disease:   Social History: Last updated: 09/20/2008 Tobacco Use - No.  Alcohol Use - no  Risk Factors: Smoking Status: never (09/20/2008)  Review of Systems       negative other than history of present illness  Vital Signs:  Patient profile:   65 year old female Height:      60 inches Weight:      244.4 pounds BMI:     47.90 Pulse rate:   60 / minute Pulse rhythm:   irregular BP sitting:   140 / 76  (left arm) Cuff size:   large  Vitals Entered By: Danielle Rankin, CMA (February 26, 2010 11:27 AM)  Physical Exam  General:  obese.   Head:  normocephalic and atraumatic Eyes:  PERRLA/EOM intact; conjunctiva and lids normal. Neck:  Neck supple, no JVD. No masses, thyromegaly or  abnormal cervical nodes. Chest Zuha Dejonge:  no deformities or breast masses noted Heart:  PMI poorly appreciated, regular rate and rhythm, normal S1-S2, carotids full without bruits Msk:  decreased ROM.   Pulses:  pulses normal in all 4 extremities Extremities:  chronic doughy edema,1+ left pedal edema and 1+ right pedal edema.   Neurologic:  Alert and oriented x 3. Skin:  Intact without lesions or rashes. Psych:  Normal affect.   EKG  Procedure date:  02/26/2010  Findings:      normal sinus rhythm, nonspecific ST  segment changes, QTC stable  Impression & Recommendations:  Problem # 1:  CAD, NATIVE VESSEL (ICD-414.01) Assessment Unchanged  The following medications were removed from the medication list:    Ramipril 2.5 Mg Caps (Ramipril) .Marland Kitchen... 1 once daily Her updated medication list for this problem includes:    Sotalol Hcl 120 Mg Tabs (Sotalol hcl) .Marland Kitchen... 1 tab two times a day    Aspirin 81 Mg Tbec (Aspirin) .Marland Kitchen... Take one tablet by mouth daily    Nitroglycerin 0.4 Mg Subl (Nitroglycerin) ..... One tablet under tongue every 5 minutes as needed for chest pain---may repeat times three    Coumadin 5 Mg Tabs (Warfarin sodium) .Marland Kitchen... Take as directed by coumadin clinic.  Orders: EKG w/ Interpretation (93000)  Problem # 2:  ATRIAL FIBRILLATION (ICD-427.31) Assessment: Improved  Her updated medication list for this problem includes:    Sotalol Hcl 120 Mg Tabs (Sotalol hcl) .Marland Kitchen... 1 tab two times a day    Digoxin 0.125 Mg Tabs (Digoxin) .Marland Kitchen... 1 tab once daily    Aspirin 81 Mg Tbec (Aspirin) .Marland Kitchen... Take one tablet by mouth daily    Coumadin 5 Mg Tabs (Warfarin sodium) .Marland Kitchen... Take as directed by coumadin clinic.  Problem # 3:  COUMADIN THERAPY (ICD-V58.61) Assessment: Unchanged  Problem # 4:  HYPERTENSION, UNSPECIFIED (ICD-401.9)  The following medications were removed from the medication list:    Ramipril 2.5 Mg Caps (Ramipril) .Marland Kitchen... 1 once daily Her updated medication list for this problem includes:    Sotalol Hcl 120 Mg Tabs (Sotalol hcl) .Marland Kitchen... 1 tab two times a day    Lasix 40 Mg Tabs (Furosemide) .Marland Kitchen... 1 tab qam..1/2 tab at bedtime    Aspirin 81 Mg Tbec (Aspirin) .Marland Kitchen... Take one tablet by mouth daily  Problem # 5:  ENCOUNTER FOR LONG-TERM USE OF OTHER MEDICATIONS (ICD-V58.69) Assessment: Unchanged  Problem # 6:  LEG EDEMA, BILATERAL (ICD-782.3)  Problem # 7:  DYSPNEA ON EXERTION (ICD-786.09)  The following medications were removed from the medication list:    Ramipril 2.5 Mg Caps  (Ramipril) .Marland Kitchen... 1 once daily Her updated medication list for this problem includes:    Sotalol Hcl 120 Mg Tabs (Sotalol hcl) .Marland Kitchen... 1 tab two times a day    Digoxin 0.125 Mg Tabs (Digoxin) .Marland Kitchen... 1 tab once daily    Lasix 40 Mg Tabs (Furosemide) .Marland Kitchen... 1 tab qam..1/2 tab at bedtime    Aspirin 81 Mg Tbec (Aspirin) .Marland Kitchen... Take one tablet by mouth daily  Patient Instructions: 1)  Your physician recommends that you schedule a follow-up appointment in: 6 months with Dr. Daleen Squibb 2)  Your physician recommends that you continue on your current medications as directed. Please refer to the Current Medication list given to you today. Prescriptions: NITROGLYCERIN 0.4 MG SUBL (NITROGLYCERIN) One tablet under tongue every 5 minutes as needed for chest pain---may repeat times three  #25 x 3   Entered by:   Danielle Rankin, CMA  Authorized by:   Gaylord Shih, MD, [facility]   Signed by:   Danielle Rankin, CMA on 02/26/2010   Method used:   Faxed to ...       [facility] (mail-order)             , [place]         Ph: [phone]       Fax: [phone]   RxID:   [id number]

## 2010-07-13 NOTE — Medication Information (Signed)
Summary: rov coumadin - [facility]  Anticoagulant Therapy  Managed by: Shelby Dubin, PharmD, BCPS, CPP Referring MD: Valera Castle MD Supervising MD: Eden Emms MD, Theron Arista Indication 1: Atrial Fibrillation (ICD-427.31) Lab Used: [facility] Site: [facility] INR POC 1.9 INR RANGE 2 - 3  Dietary changes: yes       Details: cutting down on carbs and protein, drinking more water  Health status changes: no    Bleeding/hemorrhagic complications: no    Recent/future hospitalizations: no    Any changes in medication regimen? no    Recent/future dental: no  Any missed doses?: no       Is patient compliant with meds? yes       Current Medications (verified): 1)  Sotalol Hcl 120 Mg Tabs (Sotalol Hcl) .Marland Kitchen.. 1 Tab Two Times A Day 2)  Digoxin 0.125 Mg Tabs (Digoxin) .Marland Kitchen.. 1 Tab Once Daily 3)  Altace 5 Mg Caps (Ramipril) .Marland Kitchen.. 1 Tab Once Daily 4)  Lasix 40 Mg Tabs (Furosemide) .Marland Kitchen.. 1 Tab Two Times A Day 5)  Aspirin 81 Mg Tbec (Aspirin) .... Take One Tablet By Mouth Daily 6)  Potassium Chloride Crys Cr 20 Meq Cr-Tabs (Potassium Chloride Crys Cr) .Marland Kitchen.. 1 Tab Two Times A Day 7)  Synthroid 88 Mcg Tabs (Levothyroxine Sodium) .Marland Kitchen.. 1 Tab Once Daily 8)  Nitroglycerin 0.4 Mg Subl (Nitroglycerin) .... One Tablet Under Tongue Every 5 Minutes As Needed For Chest Pain---May Repeat Times Three 9)  Methotrexate 2.5 Mg Tabs (Methotrexate Sodium) .... 5 Tabs Weekly 10)  Hydroxychloroquine Sulfate 200 Mg Tabs (Hydroxychloroquine Sulfate) .Marland Kitchen.. 1 Tab Two Times A Day 11)  Prednisone 10 Mg Tabs (Prednisone) .Marland Kitchen.. 1 Tab Once Daily 12)  Calcium 600 1500 Mg Tabs (Calcium Carbonate) .Marland Kitchen.. 1 Tab Once Daily 13)  Actonel 150 Mg Tabs (Risedronate Sodium) .Marland Kitchen.. 1 Tab Every Month 14)  Protonix 40 Mg Pack (Pantoprazole Sodium) .Marland Kitchen.. 1 Tab Once Daily 15)  Lyrica 75 Mg Caps (Pregabalin) .Marland Kitchen.. 1 Tab Two Times A Day 16)  Multivitamins   Tabs (Multiple Vitamin) .Marland Kitchen.. 1 Tab Once Daily 17)  Vitamin D 13086 Unit Caps (Ergocalciferol) .Marland Kitchen.. 1 Tab  Every Month 18)  Allopurinol 300 Mg Tabs (Allopurinol) .Marland Kitchen.. 1 Tab Once Daily 19)  Coumadin 2.5 Mg Tabs (Warfarin Sodium) .... Sunday - 1 Tab, Monday - 2 Tabs, Tuesday - 1 Tab, Wednesday - 2 Tabs, Thursday - 1 Tab, Friday - 2 Tabs, Saturday - 1 Tab  Allergies (verified): 1)  ! * Butoconazole 2)  ! * Terazole  Anticoagulation Management History:      The patient is taking warfarin and comes in today for a routine follow up visit.  Negative risk factors for bleeding include an age less than 44 years old.  The bleeding index is 'low risk'.  Positive CHADS2 values include History of HTN.  Negative CHADS2 values include Age > 68 years old.  The start date was 09/12/2000.  Anticoagulation responsible provider: Eden Emms MD, Theron Arista.  INR POC: 1.9.  Cuvette Lot#: [id number].  Exp: 03/2010.    Anticoagulation Management Assessment/Plan:      The patient's current anticoagulation dose is Coumadin 2.5 mg tabs: Sunday - 1 tab, Monday - 2 tabs, Tuesday - 1 tab, Wednesday - 2 tabs, Thursday - 1 tab, Friday - 2 tabs, Saturday - 1 tab.  The target INR is 2.0-3.0.  The next INR is due 02/24/2009.  Anticoagulation instructions were given to patient.  Results were reviewed/authorized by Shelby Dubin, PharmD, BCPS, CPP.  She was  notified by Bo Merino pharmD candidate.         Prior Anticoagulation Instructions: INR 2.8 Continue Coumadin 5mg  MWF and 2.5mg  all other days  Current Anticoagulation Instructions: INR today is 1.9 Take 5mg  tablet today then continue on the same regimen. Recheck in 4 weeks

## 2010-07-13 NOTE — Medication Information (Signed)
Summary: rov/tm  Anticoagulant Therapy  Managed by: Bethena Midget, RN, BSN Referring MD: Valera Castle MD PCP: Dr. Hunt Oris MD: Jens Som MD, Arlys John Indication 1: Atrial Fibrillation (ICD-427.31) Lab Used: [facility] Site: [facility] INR POC 2.9 INR RANGE 2 - 3  Dietary changes: no    Health status changes: no    Bleeding/hemorrhagic complications: no    Recent/future hospitalizations: no    Any changes in medication regimen? no    Recent/future dental: no  Any missed doses?: no       Is patient compliant with meds? yes       Allergies: 1)  ! * Butoconazole 2)  ! * Terazole  Anticoagulation Management History:      The patient is taking warfarin and comes in today for a routine follow up visit.  Negative risk factors for bleeding include an age less than 20 years old.  The bleeding index is 'low risk'.  Positive CHADS2 values include History of HTN.  Negative CHADS2 values include Age > 68 years old.  The start date was 09/12/2000.  Her last INR was 7.1.  Anticoagulation responsible provider: Jens Som MD, Arlys John.  INR POC: 2.9.  Cuvette Lot#: [id number].  Exp: 03/2011.    Anticoagulation Management Assessment/Plan:      The patient's current anticoagulation dose is Coumadin 5 mg tabs: Take as directed by coumadin clinic..  The target INR is 2.0-3.0.  The next INR is due 02/04/2010.  Anticoagulation instructions were given to patient.  Results were reviewed/authorized by Bethena Midget, RN, BSN.  She was notified by Bethena Midget, RN, BSN.         Prior Anticoagulation Instructions: INR 2.9 Continue 2.5mg s everyday except 5mg s on Mondays, Wednesdays and Fridays. Recheck in 4 weeks.   Current Anticoagulation Instructions: INR 2.9 Continue 2.5mg s everyday excepet 5mg s on Mondays, Wednesdays and Fridays. Recheck in 4 weeks.

## 2010-07-13 NOTE — Medication Information (Signed)
Summary: rov/tm  Anticoagulant Therapy  Managed by: Bethena Midget, RN, BSN Referring MD: Valera Castle MD PCP: Dr. Hunt Oris MD: Tenny Craw MD, Gunnar Fusi Indication 1: Atrial Fibrillation (ICD-427.31) Lab Used: [facility] Site: [facility] INR POC 3.0 INR RANGE 2 - 3  Dietary changes: yes       Details: has eaten less green leafy veggie  Health status changes: no    Bleeding/hemorrhagic complications: no    Recent/future hospitalizations: no    Any changes in medication regimen? no    Recent/future dental: no  Any missed doses?: no       Is patient compliant with meds? yes       Allergies: 1)  ! * Butoconazole 2)  ! * Terazole  Anticoagulation Management History:      The patient is taking warfarin and comes in today for a routine follow up visit.  Negative risk factors for bleeding include an age less than 22 years old.  The bleeding index is 'low risk'.  Positive CHADS2 values include History of HTN.  Negative CHADS2 values include Age > 28 years old.  The start date was 09/12/2000.  Her last INR was 7.1.  Anticoagulation responsible provider: Tenny Craw MD, Gunnar Fusi.  INR POC: 3.0.  Cuvette Lot#: [id number].  Exp: 09/2010.    Anticoagulation Management Assessment/Plan:      The patient's current anticoagulation dose is Coumadin 5 mg tabs: Take as directed by coumadin clinic..  The target INR is 2.0-3.0.  The next INR is due 08/07/2009.  Anticoagulation instructions were given to patient.  Results were reviewed/authorized by Bethena Midget, RN, BSN.  She was notified by Bethena Midget, RN, BSN.         Prior Anticoagulation Instructions: INR 2.4 Continue 2.5mg s daily except 5mg s on Mondays, Wednesdays and Fridays. Recheck in 4 weeks.   Current Anticoagulation Instructions: INR 3.0 Today take 2.5mg s then resume 2.5mg s everyday except 5mg s on Mondays, Wednesdays and Fridays. Recheck in 4 weeks.

## 2010-07-13 NOTE — Medication Information (Signed)
Summary: rov/tm  Anticoagulant Therapy  Managed by: Charolotte Eke, PharmD Referring MD: Valera Castle MD PCP: Dr. Hunt Oris MD: Ladona Ridgel MD, Sharlot Gowda Indication 1: Atrial Fibrillation (ICD-427.31) Lab Used: [facility] Site: [facility] INR POC 2.6 INR RANGE 2 - 3  Dietary changes: no    Health status changes: no    Bleeding/hemorrhagic complications: no    Recent/future hospitalizations: no    Any changes in medication regimen? no    Recent/future dental: no  Any missed doses?: no       Is patient compliant with meds? yes       Current Medications (verified): 1)  Sotalol Hcl 120 Mg Tabs (Sotalol Hcl) .Marland Kitchen.. 1 Tab Two Times A Day 2)  Digoxin 0.125 Mg Tabs (Digoxin) .Marland Kitchen.. 1 Tab Once Daily 3)  Ramipril 2.5 Mg Caps (Ramipril) .Marland Kitchen.. 1 Once Daily 4)  Lasix 40 Mg Tabs (Furosemide) .Marland Kitchen.. 1 Tab Qam..1/2 Tab At Bedtime 5)  Aspirin 81 Mg Tbec (Aspirin) .... Take One Tablet By Mouth Daily 6)  Potassium Chloride Crys Cr 20 Meq Cr-Tabs (Potassium Chloride Crys Cr) .Marland Kitchen.. 1 Tab Two Times A Day 7)  Synthroid 100 Mcg Tabs (Levothyroxine Sodium) .Marland Kitchen.. 1 Tab Once Daily 8)  Nitroglycerin 0.4 Mg Subl (Nitroglycerin) .... One Tablet Under Tongue Every 5 Minutes As Needed For Chest Pain---May Repeat Times Three 9)  Methotrexate 2.5 Mg Tabs (Methotrexate Sodium) .... 5 Tabs Weekly 10)  Hydroxychloroquine Sulfate 200 Mg Tabs (Hydroxychloroquine Sulfate) .Marland Kitchen.. 1 Tab Two Times A Day 11)  Prednisone 10 Mg Tabs (Prednisone) .Marland Kitchen.. 1 Tab Once Daily 12)  Calcium 600 1500 Mg Tabs (Calcium Carbonate) .Marland Kitchen.. 1 Tab Once Daily 13)  Actonel 150 Mg Tabs (Risedronate Sodium) .Marland Kitchen.. 1 Tab Weekly 14)  Protonix 40 Mg Pack (Pantoprazole Sodium) .Marland Kitchen.. 1 Tab Once Daily 15)  Lyrica 75 Mg Caps (Pregabalin) .Marland Kitchen.. 1 Tab Once Daily 16)  Multivitamins   Tabs (Multiple Vitamin) .Marland Kitchen.. 1 Tab Once Daily 17)  Vitamin D 16109 Unit Caps (Ergocalciferol) .Marland Kitchen.. 1 Tab Every Month 18)  Allopurinol 300 Mg Tabs (Allopurinol) .Marland Kitchen.. 1 Tab Once  Daily 19)  Coumadin 5 Mg Tabs (Warfarin Sodium) .... Take As Directed By Coumadin Clinic. 20)  Vitamin D3 2000 Unit Caps (Cholecalciferol) .Marland Kitchen.. 1 Tab Once Daily  Allergies (verified): 1)  ! * Butoconazole 2)  ! * Terazole  Anticoagulation Management History:      The patient is taking warfarin and comes in today for a routine follow up visit.  Negative risk factors for bleeding include an age less than 52 years old.  The bleeding index is 'low risk'.  Positive CHADS2 values include History of HTN.  Negative CHADS2 values include Age > 33 years old.  The start date was 09/12/2000.  Her last INR was 7.1.  Anticoagulation responsible provider: Ladona Ridgel MD, Sharlot Gowda.  INR POC: 2.6.  Cuvette Lot#: [id number].  Exp: 01/12/2011.    Anticoagulation Management Assessment/Plan:      The patient's current anticoagulation dose is Coumadin 5 mg tabs: Take as directed by coumadin clinic..  The target INR is 2.0-3.0.  The next INR is due 11/26/2009.  Anticoagulation instructions were given to patient.  Results were reviewed/authorized by Charolotte Eke, PharmD.  She was notified by Charolotte Eke, PharmD.         Prior Anticoagulation Instructions: INR 2.8 Continue 2.5mg s daily except 5mg s on Mondays, Wednesdays and Fridays. Recheck in 4 weeks.   Current Anticoagulation Instructions: The patient is to continue with the same dose of  coumadin.  This dosage includes: 2.5mg  daily except 5mg  on MWF.

## 2010-07-13 NOTE — Medication Information (Signed)
Summary: rov/tm  Anticoagulant Therapy  Managed by: Cloyde Reams, RN, BSN Referring MD: Valera Castle MD PCP: Dr. Hunt Oris MD: Jens Som MD, Arlys John Indication 1: Atrial Fibrillation (ICD-427.31) Lab Used: [facility] Site: [facility] INR POC 2.6 INR RANGE 2 - 3  Dietary changes: yes       Details: Decreased intake of vit K  Health status changes: no    Bleeding/hemorrhagic complications: no    Recent/future hospitalizations: no    Any changes in medication regimen? no    Recent/future dental: no  Any missed doses?: no       Is patient compliant with meds? yes       Current Medications (verified): 1)  Sotalol Hcl 120 Mg Tabs (Sotalol Hcl) .Marland Kitchen.. 1 Tab Two Times A Day 2)  Digoxin 0.125 Mg Tabs (Digoxin) .Marland Kitchen.. 1 Tab Once Daily 3)  Ramipril 2.5 Mg Caps (Ramipril) .Marland Kitchen.. 1 Once Daily 4)  Lasix 40 Mg Tabs (Furosemide) .Marland Kitchen.. 1 Tab Two Times A Day 5)  Aspirin 81 Mg Tbec (Aspirin) .... Take One Tablet By Mouth Daily 6)  Potassium Chloride Crys Cr 20 Meq Cr-Tabs (Potassium Chloride Crys Cr) .Marland Kitchen.. 1 Tab Two Times A Day 7)  Synthroid 88 Mcg Tabs (Levothyroxine Sodium) .Marland Kitchen.. 1 Tab Once Daily 8)  Nitroglycerin 0.4 Mg Subl (Nitroglycerin) .... One Tablet Under Tongue Every 5 Minutes As Needed For Chest Pain---May Repeat Times Three 9)  Methotrexate 2.5 Mg Tabs (Methotrexate Sodium) .... 5 Tabs Weekly 10)  Hydroxychloroquine Sulfate 200 Mg Tabs (Hydroxychloroquine Sulfate) .Marland Kitchen.. 1 Tab Two Times A Day 11)  Prednisone 10 Mg Tabs (Prednisone) .Marland Kitchen.. 1 Tab Once Daily 12)  Calcium 600 1500 Mg Tabs (Calcium Carbonate) .Marland Kitchen.. 1 Tab Once Daily 13)  Actonel 35 Mg Tabs (Risedronate Sodium) .Marland Kitchen.. 1 Tab Weekly 14)  Protonix 40 Mg Pack (Pantoprazole Sodium) .Marland Kitchen.. 1 Tab Once Daily 15)  Lyrica 75 Mg Caps (Pregabalin) .Marland Kitchen.. 1 Tab Once Daily 16)  Multivitamins   Tabs (Multiple Vitamin) .Marland Kitchen.. 1 Tab Once Daily 17)  Vitamin D 09811 Unit Caps (Ergocalciferol) .Marland Kitchen.. 1 Tab Every Month 18)  Allopurinol 300 Mg Tabs  (Allopurinol) .Marland Kitchen.. 1 Tab Once Daily 19)  Coumadin 5 Mg Tabs (Warfarin Sodium) .... Take As Directed By Coumadin Clinic.  Allergies (verified): 1)  ! * Butoconazole 2)  ! * Terazole  Anticoagulation Management History:      The patient is taking warfarin and comes in today for a routine follow up visit.  Negative risk factors for bleeding include an age less than 47 years old.  The bleeding index is 'low risk'.  Positive CHADS2 values include History of HTN.  Negative CHADS2 values include Age > 54 years old.  The start date was 09/12/2000.  Her last INR was 7.1.  Anticoagulation responsible provider: Jens Som MD, Arlys John.  INR POC: 2.6.  Cuvette Lot#: [id number].  Exp: 07/2010.    Anticoagulation Management Assessment/Plan:      The patient's current anticoagulation dose is Coumadin 5 mg tabs: Take as directed by coumadin clinic..  The target INR is 2.0-3.0.  The next INR is due 06/11/2009.  Anticoagulation instructions were given to patient.  Results were reviewed/authorized by Cloyde Reams, RN, BSN.  She was notified by Cloyde Reams RN.         Prior Anticoagulation Instructions: INR 2.7 Continue 2.5mg s everyday except 5mg s on Mondays, Wednesdays, and Fridays. Recheck in 3 weeks.   Current Anticoagulation Instructions: INR 2.6  Continue on same dosage 1/2 tablet daily except  1 tablet on Mondays, Wednesdays, and Fridays.  Recheck in 4 weeks.

## 2010-07-13 NOTE — Medication Information (Signed)
Summary: rov/lr  Anticoagulant Therapy  Managed by: Weston Brass, PharmD Supervising MD: Jens Som MD, Arlys John PT 18.5  Dietary changes: no    Health status changes: no    Bleeding/hemorrhagic complications: no    Recent/future hospitalizations: no    Any changes in medication regimen? no    Recent/future dental: no  Any missed doses?: no       Is patient compliant with meds? yes       Allergies (verified): 1)  ! * Butoconazole 2)  ! * Terazole  Anticoagulation Management History:      The patient is on coumadin and comes in today for a routine follow up visit.  Negative risk factors for bleeding include an age less than 37 years old.  The bleeding index is 'low risk'.  Positive CHADS2 values include History of HTN.  Negative CHADS2 values include Age > 33 years old.    Anticoagulation Management Assessment/Plan:      The patient's current anticoagulation dose is Coumadin 2.5 mg tabs: Sunday - 1 tab, Monday - 2 tabs, Tuesday - 1 tab, Wednesday - 2 tabs, Thursday - 1 tab, Friday - 2 tabs, Saturday - 1 tab.  She is to have a 12/02/2008.  Anticoagulation instructions were given to patient.  Results were reviewed/authorized by Weston Brass, PharmD.  She was notified by Weston Brass PharmD.         Current Anticoagulation Instructions: The patient is to continue with the same dose of coumadin.  This dosage includes:  Coumadin 2.5 mg tabs: Sunday - 1 tab, Monday - 2 tabs, Tuesday - 1 tab, Wednesday - 2 tabs, Thursday - 1 tab, Friday - 2 tabs, Saturday - 1 tab.

## 2010-07-13 NOTE — Progress Notes (Signed)
   Phone Note From Other Clinic   Call For: Dr Bonnee Quin Summary of Call: Lab called and said [name]'s INR was 7.1.  Contacted pt and she had an INR at the office today that was >6. She was told not to take her coumadin tonight. She was also told that they did not get all the blood that they needed and she would have to come back in am for more blood work. I have left a message with the office to call her and clarify what blood work is needed. She will need a repeat INR on Thurs, so perhaps any blood work needed can be deferred until then.  Initial call taken by: Park Breed PA-C,  March 10, 2009 6:32 PM     Appended Document:  This was reviewed with [name] in detail.  No bleeding.  Early Followup with clinic.

## 2010-07-13 NOTE — Assessment & Plan Note (Signed)
Summary: CAD/AFIBB/ANAS   Visit Type:  rov Primary Provider:  Dr. Ricki Miller  CC:  pt c/o of spasm like feeling LUQ over the last week or two..sob..no edema.  History of Present Illness: Mrs [name] comes in today for followup of her atrial fibrillation. She has lost about 30 pounds and feels very weak and tired. She sometimes notices that she gets a little lightheaded upon standing. This is infrequent. She's had no syncope or syncope. She denies any symptomatic palpitations or atrial fib.  She's had no angina or ischemic symptoms. Her lower extremity edema has been stable.  Current Medications (verified): 1)  Sotalol Hcl 120 Mg Tabs (Sotalol Hcl) .Marland Kitchen.. 1 Tab Two Times A Day 2)  Digoxin 0.125 Mg Tabs (Digoxin) .Marland Kitchen.. 1 Tab Once Daily 3)  Altace 5 Mg Caps (Ramipril) .Marland Kitchen.. 1 Tab Once Daily 4)  Lasix 40 Mg Tabs (Furosemide) .Marland Kitchen.. 1 Tab Two Times A Day 5)  Aspirin 81 Mg Tbec (Aspirin) .... Take One Tablet By Mouth Daily 6)  Potassium Chloride Crys Cr 20 Meq Cr-Tabs (Potassium Chloride Crys Cr) .Marland Kitchen.. 1 Tab Two Times A Day 7)  Synthroid 88 Mcg Tabs (Levothyroxine Sodium) .Marland Kitchen.. 1 Tab Once Daily 8)  Nitroglycerin 0.4 Mg Subl (Nitroglycerin) .... One Tablet Under Tongue Every 5 Minutes As Needed For Chest Pain---May Repeat Times Three 9)  Methotrexate 2.5 Mg Tabs (Methotrexate Sodium) .... 5 Tabs Weekly 10)  Hydroxychloroquine Sulfate 200 Mg Tabs (Hydroxychloroquine Sulfate) .Marland Kitchen.. 1 Tab Two Times A Day 11)  Prednisone 10 Mg Tabs (Prednisone) .Marland Kitchen.. 1 Tab Once Daily 12)  Calcium 600 1500 Mg Tabs (Calcium Carbonate) .Marland Kitchen.. 1 Tab Once Daily 13)  Actonel 35 Mg Tabs (Risedronate Sodium) .Marland Kitchen.. 1 Tab Weekly 14)  Protonix 40 Mg Pack (Pantoprazole Sodium) .Marland Kitchen.. 1 Tab Once Daily 15)  Lyrica 75 Mg Caps (Pregabalin) .Marland Kitchen.. 1 Tab Once Daily 16)  Multivitamins   Tabs (Multiple Vitamin) .Marland Kitchen.. 1 Tab Once Daily 17)  Vitamin D 16109 Unit Caps (Ergocalciferol) .Marland Kitchen.. 1 Tab Every Month 18)  Allopurinol 300 Mg Tabs (Allopurinol) .Marland Kitchen.. 1 Tab  Once Daily 19)  Coumadin 5 Mg Tabs (Warfarin Sodium) .... Take As Directed By Coumadin Clinic.  Allergies: 1)  ! * Butoconazole 2)  ! Dierdre Forth  Past History:  Past Medical History: Last updated: 09/20/2008 ATRIAL FIBRILLATION (ICD-427.31) COUMADIN THERAPY (ICD-V58.61) HYPERTENSION, UNSPECIFIED (ICD-401.9) DYSPNEA ON EXERTION (ICD-786.09) LEG EDEMA, BILATERAL (ICD-782.3)    Past Surgical History: Last updated: 09/20/2008 noncontributory  Family History: Last updated: 09/20/2008 Siblings: Brother had MI in his 17's Family History of Coronary Artery Disease:   Social History: Last updated: 09/20/2008 Tobacco Use - No.  Alcohol Use - no  Risk Factors: Smoking Status: never (09/20/2008)  Review of Systems       negative other than history of present illness  Vital Signs:  Patient profile:   65 year old female Height:      60 inches Weight:      250 pounds BMI:     49.00 Pulse rate:   51 / minute Pulse rhythm:   regular BP sitting:   116 / 60  (left arm) Cuff size:   large  Vitals Entered By: Danielle Rankin, CMA (March 10, 2009 11:36 AM)  Physical Exam  General:  obese.  obese.   Head:  normocephalic and atraumatic Eyes:  PERRLA/EOM intact; conjunctiva and lids normal. Neck:  Neck supple, no JVD. No masses, thyromegaly or abnormal cervical nodes. Chest Raley Novicki:  no deformities or breast masses  noted Lungs:  Clear bilaterally to auscultation and percussion. Heart:  p appreciated PMI, regular rate and rhythm is slow Abdomen:  obesity precludes adequate evaluation Msk:  decreased ROM.  decreased ROM.   Pulses:  pulses normal in all 4 extremities Extremities:  subcutaneous fat down to her ankles no pitting edema Neurologic:  Alert and oriented x 3. Skin:  Intact without lesions or rashes. Psych:  Normal affect.   EKG  Procedure date:  03/10/2009  Findings:      sinus bradycardia, normal PR QRS and QTC interval  Impression &  Recommendations:  Problem # 1:  CAD, NATIVE VESSEL (ICD-414.01) Assessment Unchanged  Her updated medication list for this problem includes:    Sotalol Hcl 120 Mg Tabs (Sotalol hcl) .Marland Kitchen... 1 tab two times a day    Ramipril 2.5 Mg Caps (Ramipril) .Marland Kitchen... 1 once daily    Aspirin 81 Mg Tbec (Aspirin) .Marland Kitchen... Take one tablet by mouth daily    Nitroglycerin 0.4 Mg Subl (Nitroglycerin) ..... One tablet under tongue every 5 minutes as needed for chest pain---may repeat times three    Coumadin 5 Mg Tabs (Warfarin sodium) .Marland Kitchen... Take as directed by coumadin clinic.  Problem # 2:  ATRIAL FIBRILLATION (ICD-427.31) Assessment: Unchanged  Her updated medication list for this problem includes:    Sotalol Hcl 120 Mg Tabs (Sotalol hcl) .Marland Kitchen... 1 tab two times a day    Digoxin 0.125 Mg Tabs (Digoxin) .Marland Kitchen... 1 tab once daily    Aspirin 81 Mg Tbec (Aspirin) .Marland Kitchen... Take one tablet by mouth daily    Coumadin 5 Mg Tabs (Warfarin sodium) .Marland Kitchen... Take as directed by coumadin clinic. With her bradycardia, we'll check a digoxin level. Her intervals and normal. Orders: EKG w/ Interpretation (93000)  Problem # 3:  COUMADIN THERAPY (ICD-V58.61) Assessment: Unchanged Her numbers have been up and down per the Coumadin flow sheet. Compliance was emphasized.  Problem # 4:  HYPERTENSION, UNSPECIFIED (ICD-401.9) Assessment: Improved  Her updated medication list for this problem includes:    Sotalol Hcl 120 Mg Tabs (Sotalol hcl) .Marland Kitchen... 1 tab two times a day    Ramipril 2.5 Mg Caps (Ramipril) .Marland Kitchen... 1 once daily    Lasix 40 Mg Tabs (Furosemide) .Marland Kitchen... 1 tab two times a day    Aspirin 81 Mg Tbec (Aspirin) .Marland Kitchen... Take one tablet by mouth daily With her weight reduction of 30 pounds, her blood pressure is under much better control. She is also having some dizziness sometimes when he stands. We will decrease her Altace to 2-1/2 mg per day.   Her updated medication list for this problem includes:    Sotalol Hcl 120 Mg Tabs (Sotalol  hcl) .Marland Kitchen... 1 tab two times a day    Altace 5 Mg Caps (Ramipril) .Marland Kitchen... 1 tab once daily    Lasix 40 Mg Tabs (Furosemide) .Marland Kitchen... 1 tab two times a day    Aspirin 81 Mg Tbec (Aspirin) .Marland Kitchen... Take one tablet by mouth daily  Other Orders: TLB-Digoxin (Lanoxin) (80162-DIG)  Patient Instructions: 1)  Your physician recommends that you schedule a follow-up appointment in: 6 MONTHS 2)  Your physician recommends that you return for lab work [id number]  DIG LEVEL DX V58.69 3)  Your physician has recommended you make the following change in your medication: DECREASE RAMIPRIL TO 2.5 MG EVERY DAY Prescriptions: RAMIPRIL 2.5 MG CAPS (RAMIPRIL) 1 once daily  #14 x 0   Entered by:   Scherrie Bateman, LPN   Authorized by:   Maisie Fus  Argie Ramming, MD, [facility]   Signed by:   Scherrie Bateman, LPN on 14/78/2956   Method used:   Electronically to        [facility]  [address] [facility] (retail)       [address]       [place], [place]  [address]       Ph: [phone] or [phone]       Fax: [phone]   RxID:   [id number] RAMIPRIL 2.5 MG CAPS (RAMIPRIL) 1 once daily  #90 x 3   Entered by:   Scherrie Bateman, LPN   Authorized by:   Gaylord Shih, MD, [facility]   Signed by:   Scherrie Bateman, LPN on 95/18/8416   Method used:   Electronically to        [facility]* (mail-order)             ,          Ph: [phone]       Fax: [phone]   RxID:   [id number]

## 2010-07-13 NOTE — Medication Information (Signed)
Summary: [name]  Anticoagulant Therapy  Managed by: Bethena Midget, RN, BSN Referring MD: Valera Castle MD PCP: Dr. Hunt Oris MD: Eden Emms MD, Theron Arista Indication 1: Atrial Fibrillation (ICD-427.31) Lab Used: [facility] Site: [facility] INR POC 3.7 INR RANGE 2 - 3  Dietary changes: yes       Details: Eating less green vegetables  Health status changes: yes       Details: Was having dizzy episodes saw Dr. Lynne Logan PA thought she saw dehydrated.   Bleeding/hemorrhagic complications: no    Recent/future hospitalizations: no    Any changes in medication regimen? yes       Details: lasix dose adjusted due to dizzy episodes.   Recent/future dental: no  Any missed doses?: no       Is patient compliant with meds? yes       Current Medications (verified): 1)  Sotalol Hcl 120 Mg Tabs (Sotalol Hcl) .Marland Kitchen.. 1 Tab Two Times A Day 2)  Digoxin 0.125 Mg Tabs (Digoxin) .Marland Kitchen.. 1 Tab Once Daily 3)  Ramipril 2.5 Mg Caps (Ramipril) .Marland Kitchen.. 1 Once Daily 4)  Lasix 40 Mg Tabs (Furosemide) .Marland Kitchen.. 1 Tab Two Times A Day 5)  Aspirin 81 Mg Tbec (Aspirin) .... Take One Tablet By Mouth Daily 6)  Potassium Chloride Crys Cr 20 Meq Cr-Tabs (Potassium Chloride Crys Cr) .Marland Kitchen.. 1 Tab Two Times A Day 7)  Synthroid 88 Mcg Tabs (Levothyroxine Sodium) .Marland Kitchen.. 1 Tab Once Daily 8)  Nitroglycerin 0.4 Mg Subl (Nitroglycerin) .... One Tablet Under Tongue Every 5 Minutes As Needed For Chest Pain---May Repeat Times Three 9)  Methotrexate 2.5 Mg Tabs (Methotrexate Sodium) .... 5 Tabs Weekly 10)  Hydroxychloroquine Sulfate 200 Mg Tabs (Hydroxychloroquine Sulfate) .Marland Kitchen.. 1 Tab Two Times A Day 11)  Prednisone 10 Mg Tabs (Prednisone) .Marland Kitchen.. 1 Tab Once Daily 12)  Calcium 600 1500 Mg Tabs (Calcium Carbonate) .Marland Kitchen.. 1 Tab Once Daily 13)  Actonel 35 Mg Tabs (Risedronate Sodium) .Marland Kitchen.. 1 Tab Weekly 14)  Protonix 40 Mg Pack (Pantoprazole Sodium) .Marland Kitchen.. 1 Tab Once Daily 15)  Lyrica 75 Mg Caps (Pregabalin) .Marland Kitchen.. 1 Tab Once Daily 16)  Multivitamins   Tabs  (Multiple Vitamin) .Marland Kitchen.. 1 Tab Once Daily 17)  Vitamin D 82423 Unit Caps (Ergocalciferol) .Marland Kitchen.. 1 Tab Every Month 18)  Allopurinol 300 Mg Tabs (Allopurinol) .Marland Kitchen.. 1 Tab Once Daily 19)  Coumadin 5 Mg Tabs (Warfarin Sodium) .... Take As Directed By Coumadin Clinic.  Allergies: 1)  ! * Butoconazole 2)  ! * Terazole  Anticoagulation Management History:      The patient is taking warfarin and comes in today for a routine follow up visit.  Negative risk factors for bleeding include an age less than 57 years old.  The bleeding index is 'low risk'.  Positive CHADS2 values include History of HTN.  Negative CHADS2 values include Age > 44 years old.  The start date was 09/12/2000.  Her last INR was 7.1.  Anticoagulation responsible provider: Eden Emms MD, Theron Arista.  INR POC: 3.7.  Cuvette Lot#: [id number].  Exp: 03/2010.    Anticoagulation Management Assessment/Plan:      The patient's current anticoagulation dose is Coumadin 5 mg tabs: Take as directed by coumadin clinic..  The target INR is 2.0-3.0.  The next INR is due 04/24/2009.  Anticoagulation instructions were given to patient.  Results were reviewed/authorized by Bethena Midget, RN, BSN.  She was notified by Bethena Midget, RN, BSN.         Prior Anticoagulation Instructions: INR 2.8  Continue on same dosage 1/2 tablet daily except 1 tablet on Mondays, Wednesdays, and Fridays.  Recheck in 3 weeks.    Current Anticoagulation Instructions: INR 3.7 Skip today's dose then resume 2.5mg s everyday except 5mg s on Mondays, Wednesdays and Fridays. Recheck in 2 weeks.

## 2010-07-13 NOTE — Medication Information (Signed)
Summary: rov/tm  Anticoagulant Therapy  Managed by: Bethena Midget, RN, BSN Referring MD: Valera Castle MD PCP: Dr. Hunt Oris MD: Gala Romney MD, Reuel Boom Indication 1: Atrial Fibrillation (ICD-427.31) Lab Used: [facility]  Site: [facility] INR POC 2.8 INR RANGE 2 - 3  Dietary changes: no    Health status changes: yes       Details: Rt. knee pain, using Mobic  Bleeding/hemorrhagic complications: no    Recent/future hospitalizations: no    Any changes in medication regimen? yes       Details: Stopped Tramadol and started Mobic 15mg s daily   Recent/future dental: no  Any missed doses?: no       Is patient compliant with meds? yes       Current Medications (verified): 1)  Sotalol Hcl 120 Mg Tabs (Sotalol Hcl) .Marland Kitchen.. 1 Tab Two Times A Day 2)  Digoxin 0.125 Mg Tabs (Digoxin) .Marland Kitchen.. 1 Tab Once Daily 3)  Ramipril 2.5 Mg Caps (Ramipril) .Marland Kitchen.. 1 Once Daily 4)  Lasix 40 Mg Tabs (Furosemide) .Marland Kitchen.. 1 Tab Qam..1/2 Tab At Bedtime 5)  Aspirin 81 Mg Tbec (Aspirin) .... Take One Tablet By Mouth Daily 6)  Potassium Chloride Crys Cr 20 Meq Cr-Tabs (Potassium Chloride Crys Cr) .Marland Kitchen.. 1 Tab Two Times A Day 7)  Synthroid 100 Mcg Tabs (Levothyroxine Sodium) .Marland Kitchen.. 1 Tab Once Daily 8)  Nitroglycerin 0.4 Mg Subl (Nitroglycerin) .... One Tablet Under Tongue Every 5 Minutes As Needed For Chest Pain---May Repeat Times Three 9)  Methotrexate 2.5 Mg Tabs (Methotrexate Sodium) .... 5 Tabs Weekly 10)  Hydroxychloroquine Sulfate 200 Mg Tabs (Hydroxychloroquine Sulfate) .Marland Kitchen.. 1 Tab Two Times A Day 11)  Prednisone 10 Mg Tabs (Prednisone) .Marland Kitchen.. 1 Tab Once Daily 12)  Calcium 600 1500 Mg Tabs (Calcium Carbonate) .Marland Kitchen.. 1 Tab Once Daily 13)  Actonel 150 Mg Tabs (Risedronate Sodium) .Marland Kitchen.. 1 Tab Weekly 14)  Protonix 40 Mg Pack (Pantoprazole Sodium) .Marland Kitchen.. 1 Tab Once Daily 15)  Lyrica 75 Mg Caps (Pregabalin) .Marland Kitchen.. 1 Tab Once Daily 16)  Multivitamins   Tabs (Multiple Vitamin) .Marland Kitchen.. 1 Tab Once Daily 17)  Vitamin D 16109 Unit Caps  (Ergocalciferol) .Marland Kitchen.. 1 Tab Every Month 18)  Allopurinol 300 Mg Tabs (Allopurinol) .Marland Kitchen.. 1 Tab Once Daily 19)  Coumadin 5 Mg Tabs (Warfarin Sodium) .... Take As Directed By Coumadin Clinic. 20)  Vitamin D3 2000 Unit Caps (Cholecalciferol) .Marland Kitchen.. 1 Tab Once Daily 21)  Meloxicam 15 Mg Tabs (Meloxicam) .... Take 1 Tablet Daily  Allergies: 1)  ! * Butoconazole 2)  ! * Terazole  Anticoagulation Management History:      The patient is taking warfarin and comes in today for a routine follow up visit.  Negative risk factors for bleeding include an age less than 38 years old.  The bleeding index is 'low risk'.  Positive CHADS2 values include History of HTN.  Negative CHADS2 values include Age > 54 years old.  The start date was 09/12/2000.  Her last INR was 7.1.  Anticoagulation responsible provider: Capucine Tryon MD, Reuel Boom.  INR POC: 2.8.  Cuvette Lot#: [id number].  Exp: 11/2010.    Anticoagulation Management Assessment/Plan:      The patient's current anticoagulation dose is Coumadin 5 mg tabs: Take as directed by coumadin clinic..  The target INR is 2.0-3.0.  The next INR is due 10/29/2009.  Anticoagulation instructions were given to patient.  Results were reviewed/authorized by Bethena Midget, RN, BSN.  She was notified by Bethena Midget, RN, BSN.  Prior Anticoagulation Instructions: INR 3.1 Skip today's dose then resume 2.5mg s daily except 5mg s on Mondays, Wednesdays, and Fridays. Recheck in 4 weeks.   Current Anticoagulation Instructions: INR 2.8 Continue 2.5mg s daily except 5mg s on Mondays, Wednesdays and Fridays. Recheck in 4 weeks.

## 2010-07-13 NOTE — Assessment & Plan Note (Signed)
Summary: [facility]  Medications Added ALTACE 5 MG CAPS (RAMIPRIL) 1 tab once daily SYNTHROID 88 MCG TABS (LEVOTHYROXINE SODIUM) 1 tab once daily METHOTREXATE 2.5 MG TABS (METHOTREXATE SODIUM) 5 tabs weekly HYDROXYCHLOROQUINE SULFATE 200 MG TABS (HYDROXYCHLOROQUINE SULFATE) 1 tab two times a day PREDNISONE 10 MG TABS (PREDNISONE) 1 tab once daily CALCIUM 600 1500 MG TABS (CALCIUM CARBONATE) 1 tab once daily ACTONEL 150 MG TABS (RISEDRONATE SODIUM) 1 tab every month PROTONIX 40 MG PACK (PANTOPRAZOLE SODIUM) 1 tab once daily LYRICA 75 MG CAPS (PREGABALIN) 1 tab two times a day MULTIVITAMINS   TABS (MULTIPLE VITAMIN) 1 tab once daily VITAMIN D 16109 UNIT CAPS (ERGOCALCIFEROL) 1 tab every month ALLOPURINOL 300 MG TABS (ALLOPURINOL) 1 tab once daily      Allergies Added: NKDA  CC:  swelling/feet/ankles.  History of Present Illness: [name] returns today for her history of nonobstructive coronary disease and paroxysmal atrial fibrillation. She is having no symptoms of angina or ischemia. She does have brief episodes of atrial fib which only last a minute or 2. They are infrequent.  She's very compliant with her medications.    Current Medications (verified): 1)  Sotalol Hcl 120 Mg Tabs (Sotalol Hcl) .Marland Kitchen.. 1 Tab Two Times A Day 2)  Digoxin 0.125 Mg Tabs (Digoxin) .Marland Kitchen.. 1 Tab Once Daily 3)  Altace 5 Mg Caps (Ramipril) .Marland Kitchen.. 1 Tab Once Daily 4)  Lasix 40 Mg Tabs (Furosemide) .Marland Kitchen.. 1 Tab Two Times A Day 5)  Aspirin 81 Mg Tbec (Aspirin) .... Take One Tablet By Mouth Daily 6)  Potassium Chloride Crys Cr 20 Meq Cr-Tabs (Potassium Chloride Crys Cr) .Marland Kitchen.. 1 Tab Two Times A Day 7)  Coumadin 5 Mg Tabs (Warfarin Sodium) .... As Directed 8)  Synthroid 88 Mcg Tabs (Levothyroxine Sodium) .Marland Kitchen.. 1 Tab Once Daily 9)  Nitroglycerin 0.4 Mg Subl (Nitroglycerin) .... One Tablet Under Tongue Every 5 Minutes As Needed For Chest Pain---May Repeat Times Three 10)  Methotrexate 2.5 Mg Tabs (Methotrexate  Sodium) .... 5 Tabs Weekly 11)  Hydroxychloroquine Sulfate 200 Mg Tabs (Hydroxychloroquine Sulfate) .Marland Kitchen.. 1 Tab Two Times A Day 12)  Prednisone 10 Mg Tabs (Prednisone) .Marland Kitchen.. 1 Tab Once Daily 13)  Calcium 600 1500 Mg Tabs (Calcium Carbonate) .Marland Kitchen.. 1 Tab Once Daily 14)  Actonel 150 Mg Tabs (Risedronate Sodium) .Marland Kitchen.. 1 Tab Every Month 15)  Protonix 40 Mg Pack (Pantoprazole Sodium) .Marland Kitchen.. 1 Tab Once Daily 16)  Lyrica 75 Mg Caps (Pregabalin) .Marland Kitchen.. 1 Tab Two Times A Day 17)  Multivitamins   Tabs (Multiple Vitamin) .Marland Kitchen.. 1 Tab Once Daily 18)  Vitamin D 60454 Unit Caps (Ergocalciferol) .Marland Kitchen.. 1 Tab Every Month 19)  Allopurinol 300 Mg Tabs (Allopurinol) .Marland Kitchen.. 1 Tab Once Daily  Allergies (verified): No Known Drug Allergies  Past History:  Past Medical History:    ATRIAL FIBRILLATION (ICD-427.31)    COUMADIN THERAPY (ICD-V58.61)    HYPERTENSION, UNSPECIFIED (ICD-401.9)    DYSPNEA ON EXERTION (ICD-786.09)    LEG EDEMA, BILATERAL (ICD-782.3)          (09/20/2008)  Family History:    Siblings: Brother had MI in his 23's    Family History of Coronary Artery Disease:      (09/20/2008)  Risk Factors:    Alcohol Use: N/A    >5 drinks/d w/in last 3 months: N/A    Caffeine Use: N/A    Diet: N/A    Exercise: N/A  Social History:    Reviewed history from 09/20/2008 and no changes required:  Tobacco Use - No.        Alcohol Use - no  Vital Signs:  Patient profile:   65 year old female Height:      60 inches Weight:      267 pounds BMI:     52.33 Pulse rate:   68 / minute Pulse rhythm:   regular BP sitting:   118 / 80  (left arm)  Vitals Entered By: Danielle Rankin, CMA (September 25, 2008 4:00 PM)  Physical Exam  General:  obese.   Head:  normocephalic and atraumatic Eyes:  PERRLA/EOM intact; conjunctiva and lids normal. Mouth:  Teeth, gums and palate normal. Oral mucosa normal. Neck:  Neck supple, no JVD. No masses, thyromegaly or abnormal cervical nodes. Chest [name]:  no deformities or  breast masses noted Lungs:  Clear bilaterally to auscultation and percussion. Heart:  Non-displaced PMI, chest non-tender; regular rate and rhythm, S1, S2 without murmurs, rubs or gallops. Carotid upstroke normal, no bruit. Normal abdominal aortic size, no bruits. Femorals normal pulses, no bruits. Pedals normal pulses. No edema, no varicosities. Msk:  decreased ROM.   Pulses:  pulses normal in all 4 extremities Extremities:  1+ left pedal edema and 1+ right pedal edema.   Neurologic:  Alert and oriented x 3. Skin:  Intact without lesions or rashes. Psych:  Normal affect.   Problems:  Medical Problems Added: 1)  Dx of Cad, Native Vessel  (ICD-414.01)  EKG  Procedure date:  09/25/2008  Findings:      normal:    Impression & Recommendations:  Problem # 1:  ATRIAL FIBRILLATION (ICD-427.31) Assessment Unchanged  Her updated medication list for this problem includes:    Sotalol Hcl 120 Mg Tabs (Sotalol hcl) .Marland Kitchen... 1 tab two times a day    Digoxin 0.125 Mg Tabs (Digoxin) .Marland Kitchen... 1 tab once daily    Aspirin 81 Mg Tbec (Aspirin) .Marland Kitchen... Take one tablet by mouth daily    Coumadin 5 Mg Tabs (Warfarin sodium) .Marland Kitchen... As directed  Problem # 2:  CAD, NATIVE VESSEL (ICD-414.01) Assessment: Unchanged  Her updated medication list for this problem includes:    Sotalol Hcl 120 Mg Tabs (Sotalol hcl) .Marland Kitchen... 1 tab two times a day    Altace 5 Mg Caps (Ramipril) .Marland Kitchen... 1 tab once daily    Aspirin 81 Mg Tbec (Aspirin) .Marland Kitchen... Take one tablet by mouth daily    Coumadin 5 Mg Tabs (Warfarin sodium) .Marland Kitchen... As directed    Nitroglycerin 0.4 Mg Subl (Nitroglycerin) ..... One tablet under tongue every 5 minutes as needed for chest pain---may repeat times three  Patient Instructions: 1)  Your physician recommends that you schedule a follow-up appointment in: 6 months

## 2010-07-13 NOTE — Medication Information (Signed)
Summary: rov/sp  Anticoagulant Therapy  Managed by: Bethena Midget, RN, BSN Referring MD: Valera Castle MD PCP: Dr. Hunt Oris MD: Jens Som MD, Arlys John Indication 1: Atrial Fibrillation (ICD-427.31) Lab Used: [facility] Site: [facility] INR POC 2.9 INR RANGE 2 - 3  Dietary changes: no    Health status changes: no    Bleeding/hemorrhagic complications: no    Recent/future hospitalizations: no    Any changes in medication regimen? yes       Details: Off on Cipro and Bactrim  Recent/future dental: no  Any missed doses?: no       Is patient compliant with meds? yes       Allergies: 1)  ! * Butoconazole 2)  ! * Terazole  Anticoagulation Management History:      The patient is taking warfarin and comes in today for a routine follow up visit.  Negative risk factors for bleeding include an age less than 17 years old.  The bleeding index is 'low risk'.  Positive CHADS2 values include History of HTN.  Negative CHADS2 values include Age > 34 years old.  The start date was 09/12/2000.  Her last INR was 7.1.  Anticoagulation responsible provider: Jens Som MD, Arlys John.  INR POC: 2.9.  Cuvette Lot#: [id number].  Exp: 02/2011.    Anticoagulation Management Assessment/Plan:      The patient's current anticoagulation dose is Coumadin 5 mg tabs: Take as directed by coumadin clinic..  The target INR is 2.0-3.0.  The next INR is due 01/07/2010.  Anticoagulation instructions were given to patient.  Results were reviewed/authorized by Bethena Midget, RN, BSN.  She was notified by Bethena Midget, RN, BSN.         Prior Anticoagulation Instructions: INR 4.1  Skip today's dose of Coumadin, take 1/2 tablet tomorrow, then resume same dose of 1/2 tablet every day except 1 tablet on Monday, Wednesday and Friday   Current Anticoagulation Instructions: INR 2.9 Continue 2.5mg s everyday except 5mg s on Mondays, Wednesdays and Fridays. Recheck in 4 weeks.

## 2010-07-13 NOTE — Medication Information (Signed)
Summary: rov/tm  Anticoagulant Therapy  Managed by: Bethena Midget, RN, BSN Referring MD: Valera Castle MD PCP: Dr. Hunt Oris MD: Jens Som MD, Arlys John Indication 1: Atrial Fibrillation (ICD-427.31) Lab Used: [facility] Site: [facility] INR POC 3.1 INR RANGE 2 - 3  Dietary changes: yes       Details: ate little less green leafy veggie  Health status changes: yes       Details: Rt. leg pain, had test R/O DVT prescribed Tramadol  Bleeding/hemorrhagic complications: no    Recent/future hospitalizations: no    Any changes in medication regimen? yes       Details: Tramadol 50mg  PRn 6hrs   Recent/future dental: no  Any missed doses?: no       Is patient compliant with meds? yes       Allergies: 1)  ! * Butoconazole 2)  ! * Terazole  Anticoagulation Management History:      The patient is taking warfarin and comes in today for a routine follow up visit.  Negative risk factors for bleeding include an age less than 28 years old.  The bleeding index is 'low risk'.  Positive CHADS2 values include History of HTN.  Negative CHADS2 values include Age > 46 years old.  The start date was 09/12/2000.  Her last INR was 7.1.  Anticoagulation responsible provider: Jens Som MD, Arlys John.  INR POC: 3.1.  Cuvette Lot#: [id number].  Exp: 10/2010.    Anticoagulation Management Assessment/Plan:      The patient's current anticoagulation dose is Coumadin 5 mg tabs: Take as directed by coumadin clinic..  The target INR is 2.0-3.0.  The next INR is due 10/02/2009.  Anticoagulation instructions were given to patient.  Results were reviewed/authorized by Bethena Midget, RN, BSN.  She was notified by Bethena Midget, RN, BSN.         Prior Anticoagulation Instructions: INR 2.6 Continue 2.5mg s daily except 5mg s on Mondays, Wednesdays and Fridays. Recheck in 4 weeks.   Current Anticoagulation Instructions: INR 3.1 Skip today's dose then resume 2.5mg s daily except 5mg s on Mondays, Wednesdays, and Fridays. Recheck  in 4 weeks.

## 2010-07-13 NOTE — Medication Information (Signed)
Summary: rov/ewj  Anticoagulant Therapy  Managed by: Bethena Midget, RN, BSN Referring MD: Valera Castle MD PCP: Dr. Hunt Oris MD: Eden Emms MD, Theron Arista Indication 1: Atrial Fibrillation (ICD-427.31) Lab Used: [facility] [place] Site: [facility] INR POC 2.4 INR RANGE 2 - 3  Dietary changes: no    Health status changes: no    Bleeding/hemorrhagic complications: no    Recent/future hospitalizations: no    Any changes in medication regimen? no    Recent/future dental: no  Any missed doses?: no       Is patient compliant with meds? yes       Allergies: 1)  ! * Butoconazole 2)  ! * Terazole  Anticoagulation Management History:      The patient is taking warfarin and comes in today for a routine follow up visit.  Negative risk factors for bleeding include an age less than 28 years old.  The bleeding index is 'low risk'.  Positive CHADS2 values include History of HTN.  Negative CHADS2 values include Age > 66 years old.  The start date was 09/12/2000.  Her last INR was 7.1.  Anticoagulation responsible provider: Eden Emms MD, Theron Arista.  INR POC: 2.4.  Cuvette Lot#: [id number].  Exp: 07/2010.    Anticoagulation Management Assessment/Plan:      The patient's current anticoagulation dose is Coumadin 5 mg tabs: Take as directed by coumadin clinic..  The target INR is 2.0-3.0.  The next INR is due 07/10/2009.  Anticoagulation instructions were given to patient.  Results were reviewed/authorized by Bethena Midget, RN, BSN.  She was notified by Bethena Midget, RN, BSN.         Prior Anticoagulation Instructions: INR 2.6  Continue on same dosage 1/2 tablet daily except 1 tablet on Mondays, Wednesdays, and Fridays.  Recheck in 4 weeks.    Current Anticoagulation Instructions: INR 2.4 Continue 2.5mg s daily except 5mg s on Mondays, Wednesdays and Fridays. Recheck in 4 weeks.

## 2010-07-15 NOTE — Medication Information (Signed)
Summary: rov/jb  Anticoagulant Therapy  Managed by: Bethena Midget, RN, BSN Referring MD: Valera Castle MD PCP: Dr. Hunt Oris MD: Eden Emms MD, Theron Arista Indication 1: Atrial Fibrillation (ICD-427.31) Lab Used: [facility]  Site: [facility] INR POC 3.5 INR RANGE 2 - 3  Dietary changes: yes       Details: Not eating alot of veggies  Health status changes: no    Bleeding/hemorrhagic complications: no    Recent/future hospitalizations: no    Any changes in medication regimen? no    Recent/future dental: no  Any missed doses?: no       Is patient compliant with meds? yes       Allergies: 1)  ! * Butoconazole 2)  ! * Terazole  Anticoagulation Management History:      The patient is taking warfarin and comes in today for a routine follow up visit.  Negative risk factors for bleeding include an age less than 59 years old.  The bleeding index is 'low risk'.  Positive CHADS2 values include History of HTN.  Negative CHADS2 values include Age > 87 years old.  The start date was 09/12/2000.  Her last INR was 7.1.  Anticoagulation responsible provider: Eden Emms MD, Theron Arista.  INR POC: 3.5.  Cuvette Lot#: [id number].  Exp: 07/2011.    Anticoagulation Management Assessment/Plan:      The patient's current anticoagulation dose is Coumadin 5 mg tabs: Take as directed by coumadin clinic..  The target INR is 2.0-3.0.  The next INR is due 07/08/2010.  Anticoagulation instructions were given to patient.  Results were reviewed/authorized by Bethena Midget, RN, BSN.  She was notified by Bethena Midget, RN, BSN.         Prior Anticoagulation Instructions: Cont with current regimen Return to Jan 12th, at 0900 AM  Current Anticoagulation Instructions: INR 3.5 Skip today's dose then change dose to 1/2 pill everyday except 1 pill on Mondays. Recheck in 2 weeks.

## 2010-07-15 NOTE — Medication Information (Signed)
Summary: rov/tp   Anticoagulant Therapy  Managed by: Geoffry Paradise, PharmD Referring MD: Valera Castle MD PCP: Dr. Hunt Oris MD: Patty Sermons Indication 1: Atrial Fibrillation (ICD-427.31) Lab Used: [facility] [place] Site: [facility] INR POC 3.1 INR RANGE 2 - 3  Dietary changes: no    Health status changes: no    Bleeding/hemorrhagic complications: no    Recent/future hospitalizations: no    Any changes in medication regimen? no    Recent/future dental: no  Any missed doses?: no       Is patient compliant with meds? yes       Allergies: 1)  ! * Butoconazole 2)  ! * Terazole  Anticoagulation Management History:      The patient is taking warfarin and comes in today for a routine follow up visit.  Negative risk factors for bleeding include an age less than 66 years old.  The bleeding index is 'low risk'.  Positive CHADS2 values include History of HTN.  Negative CHADS2 values include Age > 89 years old.  The start date was 09/12/2000.  Her last INR was 7.1.  Anticoagulation responsible provider: Ltanya Bayley.  INR POC: 3.1.  Cuvette Lot#: [id number].  Exp: 06/2011.    Anticoagulation Management Assessment/Plan:      The patient's current anticoagulation dose is Coumadin 5 mg tabs: Take as directed by coumadin clinic..  The target INR is 2.0-3.0.  The next INR is due 07/26/2010.  Anticoagulation instructions were given to patient.  Results were reviewed/authorized by Geoffry Paradise, PharmD.         Prior Anticoagulation Instructions: INR 3.5 Skip today's dose then change dose to 1/2 pill everyday except 1 pill on Mondays. Recheck in 2 weeks.   Current Anticoagulation Instructions: INR:  3.1 (goal 2-3)  Skip tonight's dose then return on Friday to your regular regimen of 1/2 a tablet every day and 1 full tablet on Monday.  Return on Feb 13th for another INR check.

## 2010-07-15 NOTE — Medication Information (Signed)
Summary: [name]      Allergies Added:  Anticoagulant Therapy  Managed by: Samantha Crimes, PharmD Referring MD: Valera Castle MD PCP: Dr. Hunt Oris MD: Myrtis Ser MD, Tinnie Gens Indication 1: Atrial Fibrillation (ICD-427.31) Lab Used: [facility]  Site: [facility] INR POC 3 INR RANGE 2 - 3  Dietary changes: no    Health status changes: no    Bleeding/hemorrhagic complications: no    Recent/future hospitalizations: no    Any changes in medication regimen? no    Recent/future dental: no  Any missed doses?: no       Is patient compliant with meds? yes       Current Medications (verified): 1)  Sotalol Hcl 120 Mg Tabs (Sotalol Hcl) .Marland Kitchen.. 1 Tab Two Times A Day 2)  Digoxin 0.125 Mg Tabs (Digoxin) .Marland Kitchen.. 1 Tab Once Daily 3)  Lasix 40 Mg Tabs (Furosemide) .Marland Kitchen.. 1 Tab Qam..1/2 Tab At Bedtime 4)  Aspirin 81 Mg Tbec (Aspirin) .... Take One Tablet By Mouth Daily 5)  Potassium Chloride Crys Cr 20 Meq Cr-Tabs (Potassium Chloride Crys Cr) .Marland Kitchen.. 1 Tab Two Times A Day 6)  Synthroid 100 Mcg Tabs (Levothyroxine Sodium) .Marland Kitchen.. 1 Tab Once Daily 7)  Nitroglycerin 0.4 Mg Subl (Nitroglycerin) .... One Tablet Under Tongue Every 5 Minutes As Needed For Chest Pain---May Repeat Times Three 8)  Methotrexate 2.5 Mg Tabs (Methotrexate Sodium) .... 5 Tabs Weekly 9)  Hydroxychloroquine Sulfate 200 Mg Tabs (Hydroxychloroquine Sulfate) .Marland Kitchen.. 1 Tab Two Times A Day 10)  Prednisone 10 Mg Tabs (Prednisone) .Marland Kitchen.. 1 Tab Once Daily 11)  Calcium 600 1500 Mg Tabs (Calcium Carbonate) .Marland Kitchen.. 1 Tab Once Daily 12)  Actonel 150 Mg Tabs (Risedronate Sodium) .Marland Kitchen.. 1 Tab Monthly 13)  Protonix 40 Mg Pack (Pantoprazole Sodium) .Marland Kitchen.. 1 Tab Once Daily 14)  Multivitamins   Tabs (Multiple Vitamin) .Marland Kitchen.. 1 Tab Once Daily 15)  Allopurinol 300 Mg Tabs (Allopurinol) .Marland Kitchen.. 1 Tab Once Daily 16)  Coumadin 5 Mg Tabs (Warfarin Sodium) .... Take As Directed By Coumadin Clinic. 17)  Vitamin D3 2000 Unit Caps (Cholecalciferol) .Marland Kitchen.. 1 Tab Once  Daily  Allergies (verified): 1)  ! * Butoconazole 2)  ! Dierdre Forth  Anticoagulation Management History:      Negative risk factors for bleeding include an age less than 34 years old.  The bleeding index is 'low risk'.  Positive CHADS2 values include History of HTN.  Negative CHADS2 values include Age > 70 years old.  The start date was 09/12/2000.  Her last INR was 7.1.  Anticoagulation responsible provider: Myrtis Ser MD, Tinnie Gens.  INR POC: 3.  Exp: 05/2011.    Anticoagulation Management Assessment/Plan:      The patient's current anticoagulation dose is Coumadin 5 mg tabs: Take as directed by coumadin clinic..  The target INR is 2.0-3.0.  The next INR is due 06/25/2010.  Anticoagulation instructions were given to patient.  Results were reviewed/authorized by Samantha Crimes, PharmD.         Prior Anticoagulation Instructions: INR 3.4  Skip today's dose of Coumadin then decrease dose to 1/2 tablet every day except 1 tablet on Monday and Friday.  Recheck INR in 3 weeks.   Current Anticoagulation Instructions: Cont with current regimen Return to Jan 12th, at 0900 AM

## 2010-07-26 ENCOUNTER — Encounter (INDEPENDENT_AMBULATORY_CARE_PROVIDER_SITE_OTHER): Payer: 59

## 2010-07-26 ENCOUNTER — Encounter: Payer: Self-pay | Admitting: Cardiology

## 2010-07-26 DIAGNOSIS — I4891 Unspecified atrial fibrillation: Secondary | ICD-10-CM

## 2010-07-26 DIAGNOSIS — Z7901 Long term (current) use of anticoagulants: Secondary | ICD-10-CM

## 2010-07-26 LAB — CONVERTED CEMR LAB: POC INR: 2

## 2010-08-04 NOTE — Medication Information (Signed)
Summary: [facility]   Anticoagulant Therapy  Managed by: Georgina Pillion, PharmD Referring MD: Valera Castle MD PCP: Dr. Hunt Oris MD: Patty Sermons Indication 1: Atrial Fibrillation (ICD-427.31) Lab Used: [facility] Site: [facility] INR POC 2.0 INR RANGE 2 - 3  Dietary changes: no    Health status changes: no    Bleeding/hemorrhagic complications: no    Recent/future hospitalizations: no    Any changes in medication regimen? yes       Details: On a prednisone taper  Recent/future dental: no  Any missed doses?: no       Is patient compliant with meds? yes       Allergies: 1)  ! * Butoconazole 2)  ! Dierdre Forth  Anticoagulation Management History:      Negative risk factors for bleeding include an age less than 38 years old.  The bleeding index is 'low risk'.  Positive CHADS2 values include History of HTN.  Negative CHADS2 values include Age > 84 years old.  The start date was 09/12/2000.  Her last INR was 7.1.  Anticoagulation responsible provider: [facility].  INR POC: 2.0.  Cuvette Lot#: [id number].  Exp: 06/2011.    Anticoagulation Management Assessment/Plan:      The patient's current anticoagulation dose is Coumadin 5 mg tabs: Take as directed by coumadin clinic..  The target INR is 2.0-3.0.  The next INR is due 08/19/2010.  Anticoagulation instructions were given to patient.  Results were reviewed/authorized by Georgina Pillion, PharmD.  She was notified by Georgina Pillion PharmD.         Prior Anticoagulation Instructions: INR:  3.1 (goal 2-3)  Skip tonight's dose then return on Friday to your regular regimen of 1/2 a tablet every day and 1 full tablet on Monday.  Return on Feb 13th for another INR check.       Current Anticoagulation Instructions: Continue current regimen of 1/2 tablet (2.5 mg) daily EXCEPT for 1 tablet (5 mg) on Mondays only.  INR 2.0

## 2010-08-17 ENCOUNTER — Encounter: Payer: Self-pay | Admitting: Cardiology

## 2010-08-17 DIAGNOSIS — I4891 Unspecified atrial fibrillation: Secondary | ICD-10-CM

## 2010-08-19 ENCOUNTER — Encounter: Payer: Self-pay | Admitting: Cardiology

## 2010-08-19 ENCOUNTER — Encounter (INDEPENDENT_AMBULATORY_CARE_PROVIDER_SITE_OTHER): Payer: 59

## 2010-08-19 DIAGNOSIS — Z7901 Long term (current) use of anticoagulants: Secondary | ICD-10-CM

## 2010-08-19 DIAGNOSIS — I4891 Unspecified atrial fibrillation: Secondary | ICD-10-CM

## 2010-08-19 LAB — CONVERTED CEMR LAB: POC INR: 2.7

## 2010-08-24 NOTE — Medication Information (Signed)
Summary: rov/sp   Anticoagulant Therapy  Managed by: Windell Hummingbird, RN Referring MD: Valera Castle MD PCP: Dr. Hunt Oris MD: Myrtis Ser MD, Tinnie Gens Indication 1: Atrial Fibrillation (ICD-427.31) Lab Used: [facility] Site: [facility] INR POC 2.7 INR RANGE 2 - 3  Dietary changes: yes       Details: Has eaten less greens  Health status changes: no    Bleeding/hemorrhagic complications: no    Recent/future hospitalizations: no    Any changes in medication regimen? no    Recent/future dental: no  Any missed doses?: no       Is patient compliant with meds? yes       Allergies: 1)  ! * Butoconazole 2)  ! * Terazole  Anticoagulation Management History:      The patient is taking warfarin and comes in today for a routine follow up visit.  Negative risk factors for bleeding include an age less than 51 years old.  The bleeding index is 'low risk'.  Positive CHADS2 values include History of HTN.  Negative CHADS2 values include Age > 81 years old.  The start date was 09/12/2000.  Her last INR was 7.1.  Anticoagulation responsible provider: Myrtis Ser MD, Tinnie Gens.  INR POC: 2.7.  Cuvette Lot#: [id number].  Exp: 06/2011.    Anticoagulation Management Assessment/Plan:      The patient's current anticoagulation dose is Coumadin 5 mg tabs: Take as directed by coumadin clinic..  The target INR is 2.0-3.0.  The next INR is due 09/16/2010.  Anticoagulation instructions were given to patient.  Results were reviewed/authorized by Windell Hummingbird, RN.  She was notified by Windell Hummingbird, RN.         Prior Anticoagulation Instructions: Continue current regimen of 1/2 tablet (2.5 mg) daily EXCEPT for 1 tablet (5 mg) on Mondays only.  INR 2.0  Current Anticoagulation Instructions: INR 2.7 Continue taking 1/2 tablet every day, except take 1 tablet on Mondays. Recheck in 4 weeks.

## 2010-09-16 ENCOUNTER — Ambulatory Visit (INDEPENDENT_AMBULATORY_CARE_PROVIDER_SITE_OTHER): Payer: 59 | Admitting: *Deleted

## 2010-09-16 DIAGNOSIS — I4891 Unspecified atrial fibrillation: Secondary | ICD-10-CM

## 2010-09-16 LAB — POCT INR: INR: 2.7

## 2010-09-16 NOTE — Patient Instructions (Signed)
INR 2.7  Continue taking 1 tablet on Monday and 1/2 tablet all other days.  Return to clinic in 4 weeks.

## 2010-09-17 ENCOUNTER — Encounter: Payer: Self-pay | Admitting: Cardiology

## 2010-09-23 ENCOUNTER — Ambulatory Visit (INDEPENDENT_AMBULATORY_CARE_PROVIDER_SITE_OTHER): Payer: 59 | Admitting: Cardiology

## 2010-09-23 ENCOUNTER — Encounter: Payer: Self-pay | Admitting: Cardiology

## 2010-09-23 VITALS — BP 146/78 | HR 65 | Resp 18 | Ht 60.0 in | Wt 244.0 lb

## 2010-09-23 DIAGNOSIS — I4891 Unspecified atrial fibrillation: Secondary | ICD-10-CM

## 2010-09-23 DIAGNOSIS — R0789 Other chest pain: Secondary | ICD-10-CM

## 2010-09-23 DIAGNOSIS — I2510 Atherosclerotic heart disease of native coronary artery without angina pectoris: Secondary | ICD-10-CM

## 2010-09-23 NOTE — Assessment & Plan Note (Signed)
Stable EKG on sotalol.

## 2010-09-23 NOTE — Progress Notes (Signed)
   Patient ID: [name], female    DOB: Jul 01, 1945, 65 y.o.   MRN: [id number]  HPI [name] comes in with a CC of chest tightness and increased DOE. It does not radiate. She occasionally has this at rest, especially at night. She denies any palpitations. Very compliant with meds. She has not been diagnosed with CAD but has multiple CRF's.   Review of Systems  Constitutional: Positive for fatigue.  HENT: Negative.   Respiratory: Positive for cough, chest tightness and shortness of breath.   Cardiovascular: Positive for chest pain and leg swelling. Negative for palpitations.  Neurological: Positive for weakness and light-headedness.      Physical Exam  Constitutional: She is oriented to person, place, and time. She appears well-developed and well-nourished. No distress.       Morbidly obese  HENT:  Head: Normocephalic and atraumatic.  Eyes: EOM are normal. Pupils are equal, round, and reactive to light.  Neck: Neck supple. No tracheal deviation present. No thyromegaly present.  Cardiovascular: Normal rate, regular rhythm, normal heart sounds and intact distal pulses.  Exam reveals no friction rub.   No murmur heard. Pulmonary/Chest: Effort normal and breath sounds normal. She has no wheezes. She has no rales. She exhibits no tenderness.  Abdominal: Soft. Bowel sounds are normal.  Musculoskeletal:       Minimal pitting edema  Neurological: She is alert and oriented to person, place, and time.  Skin: Skin is warm and dry. She is not diaphoretic.  Psychiatric: She has a normal mood and affect.

## 2010-09-23 NOTE — Assessment & Plan Note (Signed)
With multiple risk factors, will obtain a Lexiscan Myoview.

## 2010-09-23 NOTE — Patient Instructions (Signed)
Your physician wants you to follow-up in: 6 MONTHS WITH DR. WALL. You will receive a reminder letter in the mail two months in advance. If you don't receive a letter, please call our office to schedule the follow-up appointment.  Your physician has requested that you have a lexiscan Myoview [phone]. For further information please visit [email or web address] Please follow instruction sheet, as given.

## 2010-09-28 ENCOUNTER — Ambulatory Visit (HOSPITAL_COMMUNITY): Payer: 59 | Attending: Cardiology | Admitting: Radiology

## 2010-09-28 DIAGNOSIS — R0609 Other forms of dyspnea: Secondary | ICD-10-CM

## 2010-09-28 DIAGNOSIS — R0989 Other specified symptoms and signs involving the circulatory and respiratory systems: Secondary | ICD-10-CM

## 2010-09-28 DIAGNOSIS — R0602 Shortness of breath: Secondary | ICD-10-CM

## 2010-09-28 DIAGNOSIS — R0789 Other chest pain: Secondary | ICD-10-CM

## 2010-09-28 MED ORDER — TECHNETIUM TC 99M TETROFOSMIN IV KIT
33.0000 | PACK | Freq: Once | INTRAVENOUS | Status: AC | PRN
  Administered 2010-09-28: 33 via INTRAVENOUS

## 2010-09-28 MED ORDER — REGADENOSON 0.4 MG/5ML IV SOLN
0.4000 mg | Freq: Once | INTRAVENOUS | Status: AC
  Administered 2010-09-28: 0.4 mg via INTRAVENOUS

## 2010-09-28 MED ORDER — TECHNETIUM TC 99M TETROFOSMIN IV KIT
11.0000 | PACK | Freq: Once | INTRAVENOUS | Status: AC | PRN
  Administered 2010-09-28: 11 via INTRAVENOUS

## 2010-09-28 NOTE — Progress Notes (Signed)
[facility] SITE 3 NUCLEAR MED [address] [place] [address] [phone]  Cardiology Nuclear Med Study  [name] is a 65 y.o. female [id number] [birth year]   Nuclear Med Background Indication for Stress Test:  Evaluation for Ischemia History: '02 Echo EF 55-60%, '07 Heart Catheterization N/O CAD and '09 Myocardial Perfusion Study NL Cardiac Risk Factors: Family History - CAD and Hypertension  Symptoms:  Chest Pressure, Chest Tightness, DOE, Fatigue, Palpitations and SOB   Nuclear Pre-Procedure Caffeine/Decaff Intake:  None NPO After: 6:30am   Lungs:  clear IV 0.9% NS with Angio Cath:  22g  IV Site: R Hand  IV Started by:  Cathlyn Parsons, RN  Chest Size (in):  44" Cup Size: C  Height: 5' (1.524 m)  Weight:  242 lb (109.77 kg)  BMI:  Body mass index is 47.26 kg/(m^2). Tech Comments: n/a    Nuclear Med Study 1 or 2 day study: 1 day  Stress Test Type:  Lexiscan  Reading MD: Olga Millers, MD  Order Authorizing Provider:  T.Wall  Resting Radionuclide: Technetium 62m Tetrofosmin  Resting Radionuclide Dose: 11 mCi   Stress Radionuclide:  Technetium 59m Tetrofosmin  Stress Radionuclide Dose: 33 mCi           Stress Protocol Rest HR: 59 Stress HR: 74  Rest BP: 138/58 Stress BP: 144/50  Exercise Time (min): n/a METS: n/a          Dose of Adenosine (mg):  n/a Dose of Lexiscan: 0.4 mg  Dose of Atropine (mg): n/a Dose of Dobutamine: n/a mcg/kg/min (at max HR)  Stress Test Technologist: Milana Na, EMT-P  Nuclear Technologist:  Domenic Polite, CNMT     Rest Procedure:  Myocardial perfusion imaging was performed at rest 45 minutes following the intravenous administration of Technetium 77m Tetrofosmin. Rest ECG: Sinus bradycardia  Stress Procedure:  The patient received IV Lexiscan 0.4 mg over 15-seconds.  Technetium 33m Tetrofosmin injected at 30-seconds.  There were no significant changes with Lexiscan.  Quantitative spect images were  obtained after a 45 minute delay. Stress ECG: No significant ST segment change suggestive of ischemia.  QPS Raw Data Images:  Acquisition technically good; normal left ventricular size. Stress Images:  Normal homogeneous uptake in all areas of the myocardium. Rest Images:  Normal homogeneous uptake in all areas of the myocardium. Subtraction (SDS):  No evidence of ischemia. Transient Ischemic Dilatation (Normal <1.22): .95  Lung/Heart Ratio (Normal <0.45):  .37   Quantitative Gated Spect Images QGS EDV:  83 ml QGS ESV:  21 ml QGS cine images:  Normal Wall Motion QGS EF: 75%  Impression Exercise Capacity:  Lexiscan with no exercise. BP Response:  Normal blood pressure response. Clinical Symptoms:  There is chest pain. ECG Impression:  No significant ST segment change suggestive of ischemia. Comparison with Prior Nuclear Study: No significant change from previous study  Overall Impression:  Normal stress nuclear study with no ischemia or infarction.     Olga Millers

## 2010-09-29 ENCOUNTER — Other Ambulatory Visit (HOSPITAL_COMMUNITY): Payer: Medicare Other | Admitting: Radiology

## 2010-09-29 NOTE — Progress Notes (Signed)
ROUTED TO DR.WALL.Falecha Clark ° ° °

## 2010-09-30 ENCOUNTER — Other Ambulatory Visit: Payer: Self-pay | Admitting: Cardiology

## 2010-10-05 ENCOUNTER — Telehealth: Payer: Self-pay | Admitting: Cardiology

## 2010-10-05 NOTE — Telephone Encounter (Signed)
Pt calling today for lexiscan results from 09/28/10.  Pt aware prelimminary results were normal  But Dr. Daleen Squibb would still need to review these. Mylo Red RN

## 2010-10-06 NOTE — Telephone Encounter (Signed)
NORMAL, REASSURANCE

## 2010-10-14 ENCOUNTER — Ambulatory Visit (INDEPENDENT_AMBULATORY_CARE_PROVIDER_SITE_OTHER): Payer: 59 | Admitting: *Deleted

## 2010-10-14 DIAGNOSIS — I4891 Unspecified atrial fibrillation: Secondary | ICD-10-CM

## 2010-10-14 LAB — POCT INR: INR: 2.7

## 2010-10-19 ENCOUNTER — Telehealth: Payer: Self-pay | Admitting: Cardiology

## 2010-10-19 ENCOUNTER — Other Ambulatory Visit: Payer: Self-pay | Admitting: Cardiology

## 2010-10-19 MED ORDER — DIGOXIN 125 MCG PO TABS: 0.1250 mg | ORAL_TABLET | Freq: Every day | ORAL | Status: DC

## 2010-10-19 NOTE — Telephone Encounter (Signed)
Walk In pt Form " pt Needs Refill on Meds" sent to [facility]

## 2010-10-26 NOTE — Assessment & Plan Note (Signed)
[facility]                            CARDIOLOGY OFFICE NOTE   KATLIN, BORTNER                         MRN:          [id number]  DATE:03/30/2007                            DOB:          Oct 14, 1945    Ms. Filyaw returns today for further management of paroxysmal atrial  fibrillation.  She is totally asymptomatic with no shortness of breath,  tachy palpitations.  She does have some dyspnea on exertion but most of  this is probably due to deconditioning and weight.   Her cardiac medications are:  1. Sotalol 120 mg p.o. b.i.d.  2. Digoxin 0.125 mg a day.  3. Altace 2.5 mg a day.  4. Lasix 40 mg p.o. b.i.d.  5. Zetia 10 mg a day.  6. Aspirin 81 mg a day.  7. Potassium 20 mEq b.i.d.  8. Warfarin as prescribed.   See her maintenance medication list for the rest of her medications.   Her blood pressure today is 130/80, pulse 60 and she is sinus rhythm.  Her PR, QRS, QTC are stable.  Her weight is up from 235 to 262.  HEENT:  Normocephalic/atraumatic, PERRLA, extraocular movements intact,  sclerae are clear, facial symmetry is normal.  Carotids are full with  soft systolic sounds, thyroid is not enlarged, trachea is midline.  LUNGS:  Clear.  HEART:  Reveals a poorly appreciated PMI, she has normal S1 and S2  without gallop.  ABDOMINAL:  Obese.  EXTREMITIES:  Reveal doughy edema from her knees down to her ankles.  Pulses could not be appreciated.  NEURO:  Grossly intact.   ASSESSMENT:  Ms. Rosado atrial fibrillation is under good control with  sotalol.  She is on anticoagulation.  She is also on aspirin for  nonobstructive coronary disease.  I have made no changes in her medical  program.  We will plan on seeing her back again in 6 months.     Thomas C. Daleen Squibb, MD, [facility]  Electronically Signed    TCW/MedQ  DD: 03/30/2007  DT: 03/31/2007  Job #: [id number]   cc:   Areatha Keas, M.D.  Juline Patch, M.D.

## 2010-10-26 NOTE — Assessment & Plan Note (Signed)
[facility]                            CARDIOLOGY OFFICE NOTE   [name], [name]                         MRN:          [id number]  DATE:09/26/2007                            DOB:          1946/02/09    [name] returns today for management of the following issues:  1. Paroxysmal atrial fibrillation.  2. Moderate coronary artery disease with a stable cath, March 13, 2006.  She is asymptomatic.  3. Lower extremity edema.  4. Hypertension.  5. Anticoagulation   She brings in blood work drawn by Dr. Phylliss Bob.  Creatinine was 1.13, LFTs  were normal.  Sed rate was 11, hemoglobin 12.7.  I do not see  electrolytes.   She remains on sotalol 120 mg p.o. b.i.d., digoxin 0.125 mg a day,  Altace 2.5 mg q.a.m., Lasix 40 mg p.o. b.i.d., Zetia 10 mg a day,  aspirin 81 mg a day, potassium 20 mg b.i.d., Coumadin as directed,  Synthroid 75 mcg a day and nitroglycerin p.r.n..   Her biggest complaint is that she sometimes gets lightheaded and dizzy  and her pressures have been well below 90 systolic in the mornings  around 9-10 a.m.  She is taking her Altace at night to help avoid this,  but also has to take her Lasix, as well as her sotalol in the morning.  I have told her to stagger these.   PHYSICAL EXAMINATION:  VITAL SIGNS:  Her blood pressure today is 110/70,  pulse 64 and regular.  Weight is 271, up 9.  HEENT:  Unchanged.  NECK:  Carotids upstrokes are equal bilaterally without bruits.  No JVD.  Thyroid is not enlarged.  LUNGS:  Clear.  HEART:  Reveals a nondisplaced PMI.  Normal S1-S2.  ABDOMEN:  Soft, good bowel sounds.  No midline bruit.  EXTREMITIES:  Reveal no cyanosis or clubbing.  She has chronic puffy  edema that is really not pitting per say.  Pulses are brisk, both  dorsalis pedis and posterior tibial.  There is no sign of DVT or  cellulitis.  NEURO:  Intact.   ASSESSMENT/PLAN:  [name] seems to be doing very well, except for some  lightheadedness and drop in her blood pressure in the mornings.  I have  told her to stagger her Lasix and her sotalol in the mornings.  She  takes her Altace at night.   I will plan on seeing her back in 6 months.     Thomas C. Daleen Squibb, MD, [facility]  Electronically Signed    TCW/MedQ  DD: 09/26/2007  DT: 09/26/2007  Job #: [id number]   cc:   Areatha Keas, M.D.  Juline Patch, M.D.

## 2010-10-26 NOTE — Assessment & Plan Note (Signed)
[facility]                            CARDIOLOGY OFFICE NOTE   [name], [name]                         MRN:          [id number]  DATE:03/17/2008                            DOB:          Oct 31, 1945    [name] returns today for followup.  Since being seen in April, she has  been doing relatively well, except for aching in her legs and her  shoulders.  She has been intolerant of LIPITOR in the past and STATINS  in general with dermatomyositis and causing more muscle weakness.  She  is on Zetia 10 mg a day.   Last time when I saw her, I was worried about swings in her blood  pressure, particularly lowering of her blood pressure in the morning.  We asked her to stagger medications.  She says this has helped.   She is having no symptoms of angina.  She does have some dyspnea on  exertion.   Her medications are unchanged since last visit.  Please refer to the  maintenance medication list.   PHYSICAL EXAMINATION:  VITAL SIGNS:  Her blood pressure today is really  good at 130/70, her pulse is 61 and regular, and her weight is 275.  HEENT:  Normal.  NECK:  Carotid upstrokes are equal bilaterally without bruits.  No JVD.  Thyroid is not enlarged.  Trachea is midline.  LUNGS:  Clear.  HEART:  Soft S1 and S2.  PMI could not be appreciated.  ABDOMEN:  Soft, good bowel sounds.  No midline bruit.  No hepatomegaly.  EXTREMITIES:  A lot of subcutaneous fat, all the way down to her ankles.  Pulses are intact in her feet.  There is no tenderness in her legs.  There is no sign of DVT.  NEURO:  Grossly intact.   [name] EKG shows a sinus rhythm, rate is 61 beats per minute.  Her  PR, QRS, and QTc intervals are normal.   I had a nice talk with [name].  I will arrange for an adenosine  Myoview with her coronary artery disease and dyspnea on exertion.  I  have asked her to hold her Zetia for a couple of weeks to see if it  helps her muscle weakness.  If it  does not, she can go back on it.   She needs a tooth removed.  I have given her permission to come off her  Coumadin.  Her risk is low as long as she is in sinus rhythm.   I will plan on seeing her back again in 6 months.     Thomas C. Daleen Squibb, MD, [facility]  Electronically Signed    TCW/MedQ  DD: 03/17/2008  DT: 03/17/2008  Job #: [id number]   cc:   Juline Patch, M.D.  Areatha Keas, M.D.

## 2010-10-29 NOTE — Assessment & Plan Note (Signed)
[facility]                              CARDIOLOGY OFFICE NOTE   [name], [name]                         MRN:          [id number]  DATE:04/05/2006                            DOB:          1946-03-11    [name] returns today after being hospitalized with chest pain. Cardiac  catheterization showed nonobstructive disease with an EF 65%. Her cardiac  enzymes were negative. Other blood work was unrevealing except for lipids  with an LDL 127.   She has been doing well since discharge. She would like her sublingual  nitroglycerin renewed.   MEDICATIONS:  Unchanged since her last visit except that her Altace is down  to 2.5 mg a day. Her morning hypotensive spells have improved.   PHYSICAL EXAMINATION:  VITAL SIGNS:  Her blood pressure is 138/80, pulse 60  and regular, weight 237.  GENERAL:  She is in no acute distress.  SKIN:  Warm and dry.  NECK:  Carotid upstrokes are equal bilaterally without bruits. There is no  JVD. Thyroid is not enlarged. Trachea is midline.  LUNGS:  Clear.  HEART:  Reveals regular rate and rhythm without gallop.  ABDOMEN:  Soft with good bowel sounds.  EXTREMITIES:  Significant for a lot of subcutaneous adipose tissue. There is  no major pitting edema. Pulses were palpable.   Electrocardiogram reveals sinus rhythm with low voltage QRS.   ASSESSMENT/PLAN:  [name] is stable from a cardiovascular standpoint. We  renewed her nitroglycerin. She will continue with her current program. We  will see her back again in six months.   Dr. Ricki Miller added Welchol to her Zetia and will follow up on her lupus. She  cannot take statins because of her dermatomyositis.     Thomas C. Daleen Squibb, MD, [facility]    TCW/MedQ  DD: 04/05/2006  DT: 04/06/2006  Job #: [id number]   cc:   Areatha Keas, M.D.  Juline Patch, M.D.

## 2010-10-29 NOTE — H&P (Signed)
NAMEJOLINA, SYMONDS                  ACCOUNT NO.:  [id number]   MEDICAL RECORD NO.:  [id number]          PATIENT TYPE:  EMS   LOCATION:  MAJO                         FACILITY:  [facility]   PHYSICIAN:  Rollene Rotunda, MD, FACCDATE OF BIRTH:  1945-06-15   DATE OF ADMISSION:  03/07/2006  DATE OF DISCHARGE:                                HISTORY & PHYSICAL   PRIMARY CARDIOLOGIST:  Dr. Valera Castle.   PRIMARY CARE PHYSICIAN:  Dr. Juline Patch.   CHIEF COMPLAINT:  Chest pain.   HISTORY OF PRESENT ILLNESS:  Ms. Herford is a very pleasant 65 year old female  patient with a history of moderate obstructive coronary disease by  catheterization in 2002, paroxysmal atrial fibrillation on sotalol and  Coumadin therapy, diastolic dysfunction, hypertension, hypercholesterolemia,  and dermatomyositis who presents to the emergency room today with complaints  of chest pain.  This began around 12:30 p.m. today while at rest.  It crept  into her left neck and left shoulder.  There was no associated diaphoresis,  nausea, or dyspnea.  She did feel lightheaded, but there was no syncope.  Two weeks ago she had an episode of chest discomfort as well as abdominal  pain, and she was diagnosed with diverticulitis at that time.  She was  treated with antibiotics for a couple of weeks, and this resolved.  Of note,  she did have some darker stools with this, but this cleared up after she  finished her antibiotics.  Over the last 3 to 4 months, she has noticed some  decreased exercise tolerance as well as exertional chest tightness and  dyspnea on exertion.  Presently, she is still having about less than 1 out  of 10 chest discomfort.   PAST MEDICAL HISTORY:  1. Paroxysmal atrial fibrillation, on Coumadin and sotalol therapy.  2. Coronary disease.  Catheterization April 2002:  EF 50%, proximal LAD      50%, ostial large diagonal 80%.  Cardiolite August 2005:  No ischemia,      EF 78%.  3. Hypertension.  4.  Hypercholesterolemia.  5. Osteoporosis.  6. Degenerative joint disease.  7. Dermatomyositis, on methotrexate.  8. Morbid obesity.  9. Steroid-induced diabetes mellitus.  (The patient notes that her sugars      have improved since decreasing her prednisone dose and is currently not      on any therapy for diabetes.)  10.CHF secondary to diastolic dysfunction.  11.GERD/hiatal hernia.  12.Pancreatitis in 1975.  13.Status-post total abdominal hysterectomy.  14.Hypothyroidism.   ALLERGIES:  No known drug allergies.   MEDICATIONS:  1. Prednisone 10 mg daily.  2. Hydroxychloroquine 400 mg daily.  3. Methotrexate 12.5 mg weekly.  4. Digitek 0.125 mg daily.  5. Sotalol 120 mg b.i.d.  6. Altace 2.5 mg daily.  7. Warfarin 2.5 mg on Mondays and 5 mg all other days.  8. Lasix 40 mg twice daily.  9. Zetia 10 mg daily.  10.Aspirin 81 mg daily.  11.Potassium 20 mEq b.i.d.  12.Nitroglycerin p.r.n.  13.Synthroid 0.05 mg daily.  14.Calcium plus vitamin D 600/200 b.i.d.  15.Actonel 35 mg  weekly.  16.Protonix 40 mg daily.   SOCIAL HISTORY:  Patient lives in [place].  Denies any tobacco or alcohol  abuse.   FAMILY HISTORY:  Significant for coronary disease.  She has 2 brothers who  both have coronary disease; one of them had an MI in his 64s.   REVIEW OF SYSTEMS:  Please see HPI.  Denies any fevers, chills, cough,  dysphagia, odynophagia, melena, hematochezia, hematuria, dysuria.  She  denies any orthopnea or paroxysmal nocturnal dyspnea.  She has noted some  increased fluttering recently, but no tachy palpitations.  She has lower  extremity edema that is chronic.  Rest of review of systems are negative.   PHYSICAL EXAMINATION:  GENERAL:  She is a well-nourished, well-developed  female.  VITAL SIGNS:  Her blood pressure is 129/61, pulse is 67, respirations 12,  temperature 97.5.  Oxygen saturation is 100% on room air.  HEENT:  Head normocephalic, atraumatic.  Eyes:  PERRLA, EOMI.   Sclerae are  clear.  NECK:  Without JVD.  LYMPH:  Without lymphadenopathy.  ENDOCRINE:  Without thyromegaly.  CAROTIDS:  Without bruits bilaterally.  LUNGS:  Clear to auscultation bilaterally without wheezing, rhonchi, or  rales.  ABDOMEN:  Soft, nontender with normoactive bowel sounds.  No rebound.  No  guarding.  No organomegaly.  SKIN:  Without rashes.  EXTREMITIES:  With 1+ edema bilaterally.  Calves are soft, nontender.  MUSCULOSKELETAL:  Without spine or CVA tenderness.  NEUROLOGIC:  She is alert and oriented x3.  Cranial nerves II through XII  are grossly intact.   Chest x-ray:  No acute disease.  EKG:  Sinus rhythm with heart rate of 64,  normal axis, no ischemic changes, QTc of 417 milliseconds.   LABORATORY DATA:  BNP 88, point-of-care markers negative x1, INR 2.3.  Calcium 8.7, white count 6900, hemoglobin 12.7, hematocrit 37.1, platelet  count 249,000.  Sodium 137, potassium 4.3, chloride 101, CO2 28, glucose  132, BUN 14, creatinine 1.1.   IMPRESSION:  1. Chest pain, concerning for unstable angina pectoris.  2. Moderate obstructive coronary disease, by catheterization in 2002.  3. Paroxysmal atrial fibrillation, on sotalol therapy.  4. Coumadin therapy.  5. Good left ventricular function.  6. Hypertension.  7. Hypercholesterolemia.  8. History of steroid-induced diabetes mellitus.  9. Family history of coronary disease.  10.Dermatomyositis, on methotrexate.  11.Gastroesophageal reflux disease.  12.Hypothyroidism.  13.Morbid obesity.  14.Degenerative joint disease.  15.Osteoporosis.   PLAN:  The patient was also interviewed and examined by Dr. Antoine Poche.  We  plan to admit her and treat her with aspirin.  She will be placed on heparin  once her INR is less than 2.  We will hold her Coumadin.  With her symptoms  and risk factors, we feel that cardiac catheterization is warranted.  Risks and benefits have been discussed with the patient, and she agrees to   proceed.  We will have to wait until her INR is subtherapeutic to proceed.     ______________________________  Tereso Newcomer, PA-C    ______________________________  Rollene Rotunda, MD, [facility]    SW/MEDQ  D:  03/07/2006  T:  03/08/2006  Job:  [id number]   cc:   Thomas C. Daleen Squibb, MD, [facility]  Juline Patch, M.D.

## 2010-10-29 NOTE — Cardiovascular Report (Signed)
[place]. [facility]  Patient:    [name], [name]                        MRN: [id number] Proc. Date: 08/14/00 Adm. Date:  08/13/00 Attending:  Everardo Beals. Juanda Chance, M.D. [facility] CC:         Justine Null, M.D. [facility]  Lewayne Bunting, M.D.  Cardiopulmonary Laboratory   Cardiac Catheterization  PROCEDURES PERFORMED:  Cardiac catheterization.  CLINICAL HISTORY:  Ms. Strausser is 65 years old and has diabetes, hypertension and hyperlipidemia and was admitted with symptoms of shortness of breath and chest pain suggestive of angina and with a chest x-ray that suggested mild congestive heart failure.  She was seen in consultation by Dr. Andee Lineman and cardiac catheterization was recommended.  DESCRIPTION OF PROCEDURE:  The procedure was performed via the right femoral artery using an arterial sheath and 6 French preformed coronary catheters.  A front wall arterial puncture was performed and Omnipaque contrast was used. A distal aortogram was performed to rule out renovascular causes for hypertension and to rule out abdominal aortic aneurysm.  The patient tolerated the procedure well and left the laboratory in satisfactory condition.  RESULTS:  The left main coronary artery:  The left main coronary artery was free of significant disease.  Left anterior descending:  The left anterior descending artery gave rise to a large diagonal branch and a septal perforator.  There was 80% ostial stenosis in the large diagonal branch.  There was 50% narrowing in the proximal LAD after the septal perforator.  Circumflex artery:  The circumflex artery gave rise to an atrial branch, a small marginal branch, two moderate sized marginal branches and a posterolateral branch.  These vessels were free of disease.  Right coronary artery:  The right coronary is a moderate sized vessel that gave rise to a right ventricular branch, a posterior descending branch and four posterolateral branches.   These vessels were free of significant disease.  LEFT VENTRICULOGRAPHY:  The left ventriculogram was performed in the RAO projection showed good wall motion with no areas of hypokinesis.  The estimated ejection fraction was 50%.  DISTAL AORTOGRAM:  Distal aortogram was performed which showed patent renal arteries and no significant aortoiliac obstruction.  The left ventricular pressure was 128/23.  The aortic pressure was 128/69 with a mean of 93.  CONCLUSIONS:  Coronary artery disease with 50% narrowing in the proximal left anterior descending, 80% narrowing in the option of the large diagonal branch of the left anterior descending, no major obstruction in the circumflex and the right coronary artery with a normal left ventricular function.  RECOMMENDATIONS:  The patients diagonal lesion could be tight enough to cause ischemia.  It is not particularly favorable for percutaneous intervention and I would recommend initial medical therapy.  She has normal systolic function and her mild congestive heart failure is probably on the basis of diastolic dysfunction.  We will plan continued medical therapy of that.  If she has recurrent angina and if she has a Cardiolite suggestive of ischemia and the region of the diagonal branch of the LAD, this could be approached with percutaneous intervention. DD:  08/14/00 TD:  08/15/00 Job: [id number]

## 2010-10-29 NOTE — Discharge Summary (Signed)
[place]. [facility]  Patient:    [name], [name] Visit Number: [id number] MRN: [id number]          Service Type: MED Location: 2000 2010 01 Attending Physician:  Talitha Givens Dictated by:   Guy Franco, P.A.-C. Admit Date:  10/02/2001   CC:         Helene Kelp, M.D.  Justine Null, M.D. [facility]  Lewayne Bunting, M.D. [facility]   Referring Physician Discharge [facility]  DATE OF BIRTH:  [birth year]  DISCHARGE DIAGNOSES:  1. Atypical chest pain.  2. Dermatomyositis.  3. Muscle weakness on Lipitor, Lipitor discontinued.  4. History of atrial fibrillation, on sotalol and Coumadin.  5. Long-term Coumadin use.  6. 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  7. Hypothyroidism, treated.  8. Diabetes mellitus, oral agents.  9. Hyperlipidemia, treated. 10. Polypharmacy.  PROCEDURE:  Adenosine Cardiolite, October 03, 2001.  HOSPITAL COURSE:  [name] is a 65 year old female, who was admitted on October 02, 2001, with atypical chest pain.  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 Her EF at that time was 50%.  She was admitted with exertional chest tightness with associated weakness.  Of note, she does have a dermatomyositis as well. She is not regularly followed by a rheumatologist.  Her primary care physician is Dr. Everardo All.  During her hospital stay, laboratory studies revealed cardiac enzymes and troponins negative.  TSH 0.383.  White count 13.6 (patient is on long-term prednisone), hemoglobin 14.3, hematocrit 41.6, platelets 382.  Sodium 136, potassium 4.4, BUN 22, creatinine 0.8.  During her hospitalization, the patient underwent adenosine Cardiolite that revealed no ischemia.  Her EF was calculated at 75%.  During her hospitalization, she was noted to have arthralgias and muscle aches which was felt to be secondary to the Lipitor.  The Lipitor was stopped, and she is being  discharged to home on Zetia 10 mg 1 p.o. q.d.  Dr. Andee Lineman will follow her up in the office and at that time, will consider fish oil regimen.  In addition, because of her dermatomyositis, she will need to follow up with a local rheumatologist, and I have tried to made arrangements for the patient to see Dr. Phylliss Bob.  I was unable to speak with someone at that office; however, I did leave a message and did fax over her demographic information so that they could return a call to her with an appointment to see Dr. Phylliss Bob.  In addition, the patient has an appointment with Dr. Everardo All on October 10, 2001, at 1:15 p.m.  She has an appointment in the [facility]madin Clinic on October 08, 2001, at 11:15 a.m.  She has an appointment with Dr. Andee Lineman on Oct 26, 2001, at 3:30 p.m.  DISCHARGE MEDICATIONS: 1. Stop Lipitor. 2. Start Zetia 10 mg 1 p.o. q.d.  MEDICATIONS PRIOR TO ADMISSION:  1. She is to resume all other medications as prior to admission including her     Coumadin.  2. Prednisone taper.  3. Sotalol 80 mg b.i.d.  4. Coumadin 5 mg on Monday and Friday, 2.5 mg on Tuesday, Thursday, Saturday,     and Sunday and apparently, she takes no Coumadin on Wednesdays.  5. Lasix 20 mg b.i.d.  6. Lipitor (has been stopped).  7. Aspirin 81 mg q.d.  8. Sublingual nitroglycerin p.r.n.  9. K-Dur 20 mEq p.o. b.i.d. 10. Synthroid 50 mg 1 p.o. q.d. 11. Actos  15 mg q.d. 12. Premarin 0.625 mg q.d. 13. Calcium q.d. 14. Nexium q.d. 15. ______ q week. 16. Pramosone 2.5 mg b.i.d. 17. Protopic 0.1% q.h.s. Dictated by:   Guy Franco, P.A.-C. Attending Physician:  Talitha Givens DD:  10/04/01 TD:  10/04/01 Job: [id number]

## 2010-10-29 NOTE — H&P (Signed)
[place]. [facility]  Patient:    [name], [name] Visit Number: [id number] MRN: [id number]          Service Type: MED Location: 1800 1829 01 Attending Physician:  Hanley Seamen Dictated by:   Luis Abed, M.D. [facility] Admit Date:  10/02/2001   CC:         Lewayne Bunting, M.D. [facility] Darrelyn Hillock, M.D. [facility]   History and Physical  HISTORY OF PRESENT ILLNESS:  This 65 year old patient is sent from our primary care office with chest discomfort and is now admitted to evaluate this further.  She has a complex medical history.  Her chest discomfort today is difficult to assess fully.  She does have dermatomyositis and has pain with this.  Further evaluation will be done in the hospital.  ALLERGIES:  The patient has had a rash and swelling from TERAZOL        2, ______  and FEMSTAT.  MEDICATIONS:   1. Prednisone.  The dose has been increased recently and there is some      tapering but she is currently on 50 mg a day.   2. Sotalol 80 mg p.o. b.i.d.   3. Altace 10 mg p.o. daily.   4. Coumadin 5 mg on Monday and Friday, 2.5 mg on Tuesday, Thursday, Saturday      and Sunday and none on Wednesday??   5. Lasix 20 mg p.o. b.i.d.   6. Lipitor 80 mg daily.   7. Aspirin 81 mg daily.   8. Nitroglycerin p.r.n.   9. K-Dur 20 mEq p.o. b.i.d.  10. Synthroid 50 mcg daily.  11. Actos 15 mg daily.  12. Premarin 0.625 mg daily.  13. Calcium.  14. Nexium.  15. Actonel q.wk.  16. Pramosone 2.5% b.i.d.  17. Protopic 0.1% q.h.s.  PAST MEDICAL HISTORY:  Other medical problems:  The complete problem list including the other medical problems is in my final problem list.  SOCIAL HISTORY:  The patient lives in [place] with her husband.  She is married with children.  She does not smoke or drink alcohol.  She worked in the past.  FAMILY HISTORY:  The information concerning her mother is not available.  Her father had died of rheumatic fever.  She has two brothers that  do have coronary disease.  REVIEW OF SYSTEMS:  The patient has had no major fevers or chills.  She has had some headaches.  She has had some mild visual difficulties.  She has her skin rash that is associated with her dermatomyositis.  The patient has chronic shortness of breath and chest pain.  She also today had some dizziness.  GU:  There are no major GU symptoms.  GI:  The patient has had some nausea.  PSYCHIATRIC:  She has had some depression due to her multiple illnesses.  MUSCULOSKELETAL:  She does have some arthralgias related to the dermatomyositis.  She has no polyuria or polydipsia.  The remainder of her review of systems is negative.  PHYSICAL EXAMINATION:  VITAL SIGNS:  In the emergency room, temperature is 97.9 with a pulse of 64 and respirations of 20.  Blood pressure is 117/70.  GENERAL:  The patient is well-developed and obese.  She is in no distress.  HEENT:  No marked abnormalities.  NECK:  Supple.  There were no obvious bruits.  There was no obvious lymphadenopathy.  CARDIAC:  Exam revealed an S1 with an S2.  There were no significant  murmurs.  LUNGS:  Clear.  SKIN:  Skin revealed her old rash.  ABDOMEN:  Nontender.  GU AND RECTAL:  Not evaluated.  EXTREMITIES:  The patient, on her extremities, has some lymphedema.  NEUROLOGIC:  Neurologically, she is grossly intact.  LABORATORY AND ACCESSORY DATA:  The chest x-ray reveals no active disease.  EKG is normal.   Hemoglobin is 14.  BUN is 22 with a creatinine of 0.08.  Total CPK is 71 with a troponin of 0.02.  INR is 3.5.  See the chart for the remainder of the labs.  PROBLEM LIST:   1. History of a 50% left anterior descending lesion in the past, although      the catheterization report is not available.   2. History of ejection fraction of 50%.   3. History of dermatomyositis.  The patient is followed by      Dr. Amaryllis Dyke, who is the head of dermatology at [facility],      major  steroids, currently, she is on 50 mg a day.   4. Thyroid, on medications.   5. History of diastolic dysfunction in the past.   6. History of paroxysmal atrial fibrillation with a transesophageal      echocardiography cardioversion in the past in March of 2002.   7. History of a hysterectomy.   8. Sotalol therapy for paroxysmal atrial fibrillation.   9. Diabetes.  10. Coumadin.  11. Lipid abnormalities, on medication.  12. Polypharmacy. *13. Chest tightness with some nausea today.  The etiology is not clear.      Electrocardiogram reveals no acute change.  PLAN:  At this point, I feel it is most prudent to check enzymes.  We will gather data from [facility] and the office; the office data will be obtained in the morning.  The patient will be monitored.  We will contact Dr. Justine Null, her primary physician, to help [place] with her hospitalization, as she is so complex from the medical viewpoint.  Coumadin will be held today.  We will ask for Dr. Felisa Bonier input tomorrow, as he knows her cardiac status.  At this point, I have not ordered either exercise testing or planned for catheterization.  I feel it is most prudent to gather data first and then proceed. Dictated by:   Luis Abed, M.D. [facility] Attending Physician:  Hanley Seamen DD:  10/02/01 TD:  10/03/01 Job: [id number]

## 2010-10-29 NOTE — Discharge Summary (Signed)
[place]. [facility]  Patient:    [name], [name]                         MRN: [id number] Adm. Date:  [id number] Disc. Date: 12/22/00 Attending:  Rollene Rotunda Dictator:   Joellyn Rued, P.A.-C. CC:         Justine Null, M.D. [facility]   Referring Physician Discharge [facility]  DATE OF BIRTH:  01/22/1946  PRIMARY CARE PHYSICIAN:  Justine Null, M.D.  SUMMARY OF HISTORY:  [name] is a 65 year old white female who on the evening of admission noticed acute onset of rapid heartbeat associated with discomfort radiating up into her chest.  She felt that it was not like her previous angina symptoms.  Her rate was initially in the 140s-160s and when she went to the emergency room, she received Cardizem and is now pain-free.  She has a history of paroxysmal atrial fibrillation with self-limited episodes in October of 2001.  Her last admission was in March of 2002, requiring TEE cardioversion and has been therapeutic on her Coumadin since that time.  She does report occasional palpitations.  Her history is notable for coronary artery disease with a catheterization in March of 2002 that showed a 50% LAD, 80% diagonal, and an EF of 50%.  She also has a history of hypothyroidism, dermatological problems, diabetes, GERD, hyperlipidemia, and obesity.  LABORATORY DATA:  Her TSH level was 2.473.  CKs and troponins were negative for myocardial infarction.  The sodium was 140, potassium 4.0, BUN 21, creatinine 1.4, and glucose 150.  The PT was on admission was 23.6 with an INR of 2.7.  On December 20, 2000, the PT was 24.8 with an INR of 2.9 and on December 21, 2000, her INR was 2.4.  The H&H was 14.0 and 40.6, normal indices, platelets 329, and WBC 11.1.  The initial EKG showed her in atrial fibrillation with a ventricular rate of 153 and no acute changes.  Subsequent EKG showed atrial fibrillation with a ventricular rate of 85 and again no ST changes.  Further EKGs showed  normal sinus rhythm with a QT interval of 408 and a QTC interval of 408.  HOSPITAL COURSE:  [name] was admitted to [facility]. [facility] on her home medications.  Doylene Canning. Ladona Ridgel, M.D., saw her on ______ and felt that if she remained in atrial fibrillation, cardioversion would be arranged.  Her Toprol was held and she was placed on Betapace 80 mg q.12h.  On December 21, 2000, cardioversion was planned, however, after placing the pads and receiving 100 mg ______, she converted spontaneously to normal sinus rhythm.  Thus, she did not need any cardioversion.  Her sotalol was continued overnight.  She remained in normal sinus rhythm.  Thus, after reviewing, Veneda Melter, M.D., felt that she could be discharged home.  DISCHARGE DIAGNOSIS:  Paroxysmal atrial fibrillation with spontaneous conversion to normal sinus rhythm.  DISPOSITION:  She is discharged home.  DISCHARGE MEDICATIONS:  Her home medications include a new prescription for sotalol 120 mg b.i.d.  She was asked to continue her other medications which include Levothroid 0.05 mg q.d., prednisone 12.5 mg q.d., Coumadin 2.5 mg q.d., Lasix 40 mg q.d., Lipitor 40 mg q.h.s., baby aspirin 81 mg q.d., Nexium 20 mg q.d., Actos 15 mg q.d., K-Dur 20 mEq q.d., Premarin as previously, and her creams for her dermatitis.  DIET:  She was asked to maintain a  low-salt, low-fat, low-cholesterol, ADA diet.  SPECIAL INSTRUCTIONS:  To avoid caffeine and pseudoephedrine.  She was instructed not to take her Toprol.  FOLLOW-UP:  To have her PT INR checked as scheduled.  She will see Lewayne Bunting, M.D., in the office on January 16, 2001, at 11:45 a.m. DD:  12/22/00 TD:  12/22/00 Job: [id number]

## 2010-10-29 NOTE — Discharge Summary (Signed)
[place]  Patient:    [name], [name]                         MRN: [id number] Adm. Date:  [id number] Disc. Date: [id number] Attending:  Learta Codding Dictator:   Lavella Hammock, P.A. CC:         Justine Null, M.D. [facility]  Lewayne [name], M.D.   Discharge Summary  DATE OF BIRTH:  Oct 19, 1945.  PROCEDURES:  1. Synchronized cardioversion.  2. Transesophageal echocardiogram.  HOSPITAL COURSE:  [name] is a 65 year old female with a known history of coronary artery disease treated medically who had a sudden onset of increased heart rate that was associated with chest pain approximately 6 p.m. on the day of admission.  In the emergency room her heart rate was approximately 165 and she was in atrial fibrillation with a rapid ventricular response.  She had a previous history of PAF in July of 2001.  She was admitted for further evaluation and given IV Cardizem for rate control.  Her rate improved on IV Cardizem but she still routinely ran greater than 120 and she was evaluated by Dr. Graciela Husbands for the possibility of cardioversion.  She had a TSH checked and that was within normal limits.  She was scheduled for a TEE cardioversion and anticoagulated with lovenox and started on Coumadin.  The TEE cardioversion was done on September 12, 2000, and showed mild LVH with an EF of 60%.  There was mild MR and trivial AR.  There was no LAA thrombus.  She was cardioverted with 300 joules x 1 into normal sinus rhythm.  She had been on Toprol XL prior to admission at 50 mg a day and this was increased to 100 mg a day and the Cardizem was stopped.  She was started on Coumadin at 2-1/2 mg a day and her INR came up rapidly.  She tolerated the increased dose of Toprol well.  By September 14, 2000, she had had no recurrence of atrial fibrillation and her INR was 1.9.  She was covered with a lovenox as a bridge to Coumadin, scheduled for a follow up with the Coumadin clinic and  Dr. Andee Lineman.  She was considered stable for discharge on September 14, 2000.  LABORATORY DATA:  Urinalysis was negative.  TSH 2.5 to 4.0.  Serial CK MB and troponin I negative for MI.  BMET was within normal limits except for an albumin of 3.1 and a total protein of 5.9.  Sodium 143, potassium 3.5, chloride 107, CO2 28, BUN 18, creatinine 0.8, glucose 128.  INR at discharge 1.9.  Hemoglobin 13.6, hematocrit 40.0, WBCs 9.3, platelets 315.  CHEST X-RAY:  Mild cardiomegaly with subsegmental atelectasis in the left base and no active pulmonary disease.  DISCHARGE CONDITION:  Improved.  CONSULTS:  None.  COMPLICATIONS:  None.  DISCHARGE DIAGNOSIS:  1. Atrial fibrillation with rapid ventricular response.  2. Coronary artery disease with a catheterization in August 14, 2000, showing     50% LAD, 80% diagonal, medical therapy recommended.  3. Cardiolite August 22, 2000, negative for ischemia, negative for scar an     ejection fraction of 74%.  4. History of paroxysmal atrial fibrillation in July of 2001.  5. History of dermatomyositis treated with chronic steroids.  6. Hyperglycemia secondary to steroids.  7. Gastroesophageal reflux disease.  8. Treated hyperlipidemia.  9. Status post TAH. 10. Family history of coronary artery  disease. 11. Anticoagulation with Coumadin begun this admission.  DISCHARGE INSTRUCTIONS:  Her activity level is to be as tolerated and she can return to work on Monday September 18, 2000.  DIET:  She is to stick to a low salt, low fat diet.  FOLLOWUP:  She is to follow up with Dr. Andee Lineman on October 02, 2000, at 4:30 p.m. She has an appointment at the Coumadin clinic on Friday, September 15, 2000, at 9:45 a.m.  She is also to follow with Dr. Everardo All and call for an appointment.   DISCHARGE MEDICATIONS:  1. Lipitor 20 mg q. day.  2. Nexium 20 mg q. day.  3. Premarin 0.625 mg q. day.  4. Coumadin 2.5 mg q. day.  5. Aspirin 81 mg q. day.  6. Lasix 20 mg q. day.  7. Toprol XL  100 mg q. day.  8. Prednisone 15 mg q. day.  9. Actos 15 mg q. day. 10. Lovenox 100 mg subcutaneous tonight and on September 15, 2000, x 2 doses. DD:  09/14/00 TD:  09/14/00 Job: [id number]

## 2010-10-29 NOTE — H&P (Signed)
[place]  Patient:    [name], [name]                         MRN: [id number] Adm. Date:  [id number] Attending:  Corwin Levins CC:         Justine Null, M.D. [facility], [facility]   History and Physical  CHIEF COMPLAINT: Chest pain with congestive heart failure on x-ray.  HISTORY OF PRESENT ILLNESS: Ms. Sebek is a 65 year old white female who awoke at 2 a.m. with substernal chest pain that lasted about 30 minutes and resolved spontaneously, even while recumbent the whole time.  At 7:30 a.m. it recurred while she was at work, described as substernal chest discomfort radiating to the neck, with nausea and light headedness.  She went home with her husband at that point.  Blood pressure was okay at home.  She slept for four to five hours and the pain seemed to go away, then she woke up again with increasing pain on exertion.  She called the physician on-call and went to the ER by 3 p.m., with pain at that time decreasing with rest and oxygen.  No nitroglycerin was given in the ER.  There was some vague tightness that seemed in the upper chest that was "residual."  At 4:40 p.m. CK-MB was 38, troponin I negative, and initial chest x-ray showed mild CHF, which is new for her.  PAST MEDICAL HISTORY:  1. Morbid obesity.  2. Dermatomyositis.  3. GERD.  4. 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  5. Hypertension.  6. Hypercholesterolemia.  7. History of atrial fibrillation in August 2001 while hospitalized at [facility].  Echocardiogram done at that time, also previously in July     2001, and stress Cardiolite currently negative at [facility] per the patient.  8. History of pneumonia/bronchitis in November 2001.  9. History of pancreatitis remotely.  PAST SURGICAL HISTORY:  1. Status post TAH, with ovaries intact.  2. Status post deviated nasal septum.  ALLERGIES: No known drug allergies.  CURRENT MEDICATIONS:  1. Prednisone 5 mg 3-1/2  tablets p.o. q.d., just down from 4 tablets     recently.  2. Cartia XT 240 mg p.o. q.d.  3. Diovan 80 mg p.o. q.d.  4. Actos 15 mg.  5. Aciphex 20 mg.  6. Lipitor 20 mg.  7. Premarin 0.625 mg.  8. Nizoral 200 mg p.o. q.d. for three weeks.  9. Calcium. 10. Vitamin D.  SOCIAL HISTORY: No tobacco.  No alcohol.  She has been married for three to four years and works as a Curator at [employer].  She has two children, otherwise healthy.  FAMILY HISTORY: Father deceased with heart disease and rheumatic fever.  Two brothers with MI, ages 66 and 28, and hypertension.  Uncle with rheumatoid arthritis.  REVIEW OF SYSTEMS: Otherwise noncontributory.  PHYSICAL EXAMINATION:  GENERAL: Ms. Graefe is a very pleasant 65 year old white female.  She is alert and oriented x 3 and in no apparent distress.  VITAL SIGNS: Blood pressure 125/78, respirations 20, pulse 70, temperature 97.9 degree.  Oxygen saturation 98% on 2 liters oxygen.  HEENT: Sclerae clear.  TMs clear.  Pharynx benign.  NECK: Without lymphadenopathy, JVD, or thyromegaly.  CHEST: No rales or wheezing, after IV Lasix given in the emergency room.  No chest wall tenderness.  CARDIAC: Regular rate and rhythm.  ABDOMEN: Soft, nontender.  Positive bowel sounds.  No organomegaly, no masses.  EXTREMITIES: No edema.  NEUROLOGIC: Cranial nerves 2-12 intact, otherwise nonfocal.  LABORATORY DATA: Chest x-ray shows mild CHF.  ECG normal.  WBC 9.2, hemoglobin 13.4.  CMET shows a glucose of 111, otherwise negative. INR 0.9.  CPK 38.  Troponin I 0.1.  ASSESSMENT/PLAN: Chest pain with new onset congestive heart failure.  The history itself seems highly suspicious for intermittent ischemic event with pulmonary edema.  She is to be admitted to the coronary care unit or step-down unit.  Intravenous Lasix in the emergency room has seemed to have done quite well.  Will hold further.  Cardiology service consultation will be  obtained. Though mild symptoms are largely not convincing her history is very suggestive and I think warrants more aggressive treatment and evaluation.  We will start intravenous heparin at this time and give nitroglycerin per the cardiology service if necessary.  Continue Ecotrin and other home medications as above. DD:  08/13/00 TD:  08/14/00 Job: [id number]

## 2010-10-29 NOTE — Discharge Summary (Signed)
NAMESIDNI, FUSCO                  ACCOUNT NO.:  [id number]   MEDICAL RECORD NO.:  [id number]          PATIENT TYPE:  INP   LOCATION:  4735                         FACILITY:  [facility]   PHYSICIAN:  Learta Codding, M.D. LHCDATE OF BIRTH:  September 13, 1945   DATE OF ADMISSION:  04/01/2005  DATE OF DISCHARGE:  04/04/2005                           DISCHARGE SUMMARY - REFERRING   DISCHARGE DIAGNOSIS:  1.  Recurrent paroxysmal atrial fibrillation with a rapid ventricular rate.  2.  Orthostatic dizziness with slight hypotension, improved.  3.  History as below.   ADMISSION HISTORY:  Ms. Murphey is a 65 year old white female with multiple  medical problems, one of which includes paroxysmal atrial fibrillation.  She  presented with increased complications and a rapid ventricular rate  associated with mild chest pressure.  She has had similar episodes in the  past that have resolved spontaneously.  She had taken an extra Toprol prior  to her presentation, however, her heart rate did not improve.  In the  emergency room, she received an IV Diltiazem which decreased her heart rate  to 100 to 110.   PAST MEDICAL HISTORY:  Notable for obesity, recurrent PAF, mild coronary  artery disease with ostial diagonal 80% lesion by catheterization, and  Cardiolite in August 2005 did not show any ischemia.  INR approximately two  weeks ago was 2.5.  She also has a history of dermatophytosis, diabetes,  hypertension, hyperlipidemia, GERD.   LABORATORY DATA:  Chest x-ray showed no acute findings.  Admission H&H was  12 and 36.3, normal indices, platelets 329, WBC 7.9.  PTT 35, PT 24.9, INR  2.2.  Sodium 139, potassium 3.8, BUN 16, creatinine 1.  Normal LFTs.  Prior  to discharge on October 23, sodium 141, potassium 3.6, BUN 15, creatinine 1,  glucose 77.  CK, MBs, and troponins were negative x 3.  TSH was 3.103.  Digoxin level was 0.5.   Orthostatic blood pressures on initial presentation did show a slight drop  from  115 to 106 systolic with sitting to standing.  However, prior to  discharge, they did not show any changes.   HOSPITAL COURSE:  Ms. Rader was admitted to 4700 for atrial fibrillation with  increased ventricular rate.  Her Sotalol was increased to 120 mg b.i.d.  Over the next several days, it was felt that we should monitor EKGs in  regards to her QT intervals.  It was felt that if she did not spontaneously  cardiovert, consideration of DC cardioversion should be pursued.  However,  on April 02, 2005, she spontaneously converted to normal sinus rhythm.  Initially on the first day or two of admission, she did complain of some  slight dizziness with standing.  By October 23, she felt much better, blood  pressure was stable, and Dr. Andee Lineman felt that she could be discharged home.  On October 23 prior to discharge, EKG showed normal sinus rhythm,  ventricular rate 60, QTC interval of 476.   DISPOSITION:  Ms. Bodley is discharged home, asked to maintain a low salt,  fat, and cholesterol diet.  Activities were not restricted.   DISCHARGE MEDICATIONS:  Her new medication includes increasing her Sotalol  to 120 mg b.i.d.  She was asked to continue her other medications which  include aspirin 81 mg daily, digoxin 0.125 daily, Warfarin 5 mg daily except  2.5 on Monday, Wednesday, and Friday, Synthroid 60 mcg daily, Lasix 40 mg  b.i.d., Zetia 10 mg daily, Methotrexate 2.5 mg on Tuesday, K-Dur 40 mEq  daily, Hydroxyquinone 400 mg daily, prednisone 10 mg daily, nitroglycerin  0.4 mg p.r.n., calcium with vitamin D daily, Nexium 20 mg daily, Actonel 35  mg daily , her usual lotions and creams.   FOLLOW UP:  She will follow up with Dr. Andee Lineman on May 09, 2005, at 9:15  a.m.  Follow up with Dr. Everardo All as needed.  She was instructed not to take  her Toprol.  She will have her PT and INR scheduled.      Joellyn Rued, P.A. [facility]      Learta Codding, M.D. [facility]  Electronically Signed    EW/MEDQ   D:  04/04/2005  T:  04/04/2005  Job:  [id number]   cc:   Gregary Signs A. Everardo All, M.D. [facility]  [address]  [place]  [place] [address]

## 2010-10-29 NOTE — Cardiovascular Report (Signed)
[name], [name]                  ACCOUNT NO.:  [id number]   MEDICAL RECORD NO.:  [id number]          PATIENT TYPE:  INP   LOCATION:  2034                         FACILITY:  [facility]   PHYSICIAN:  Salvadore Farber, MD  DATE OF BIRTH:  Mar 16, 1946   DATE OF PROCEDURE:  03/13/2006  DATE OF DISCHARGE:                              CARDIAC CATHETERIZATION   PROCEDURE:  Left heart catheterization, left ventriculography, coronary  angiography.   INDICATION:  Ms. Pastrana is a 65 year old lady with moderate coronary disease  diagnosed at cardiac catheterization in 2005.  She also has paroxysmal  atrial fibrillation, hypertension, hypercholesterolemia, steroid-induced  diabetes mellitus and dermatomyositis.  She presented on March 07, 2006  with 15 minutes of substernal chest discomfort radiating to her left neck.  This occurred at rest.  She ruled out for myocardial infarction by serial  enzymes and electrocardiograms.  She had no further episodes of discomfort.  Catheterization was delayed while her INR declined from therapeutic levels  to make catheterization safe.  She presents today for diagnostic  angiography.   PROCEDURAL TECHNIQUE:  Informed consent was obtained.  Under 1% lidocaine  local anesthesia, a 5-French sheath was placed in the right common femoral  artery using the modified Seldinger technique.  Diagnostic angiography and  ventriculography were performed using JL-4, JR-4, and pigtail catheters.  The patient tolerated the procedure well and was transferred to the holding  room in stable condition.   COMPLICATIONS:  None.   FINDINGS:  1. LV:  130/17/21.  EF 65% without regional wall motion abnormality.  2. No aortic stenosis or mitral regurgitation.  3. Left main:  Angiographically normal.  4. LAD:  Moderate-sized vessel giving rise to two diagonals.  The first      diagonal is long but less than 2 mm in diameter; the second diagonal      was very small.  The mid LAD has  a 40-50% stenosis occurring after the      origin of the first diagonal.  The first diagonal has a 70-80% ostial      stenosis.  These are both unchanged compared with 2005.  5. Circumflex:  Moderate-sized vessel giving rise to one branching      marginal.  It is angiographically normal.  6. RCA:  Large, dominant vessel.  It is angiographically normal.   IMPRESSION/PLAN:  There has been no change in her coronary anatomy compared  with the catheterization of August 2005.  She has had only a single episode  of chest discomfort.  A stress test in 2005 done after her catheterization  demonstrated no significant ischemia.  I therefore suspect a noncardiac  etiology to her current chest pain.  We will discharge her home later today.  We will resume Coumadin tonight.      Salvadore Farber, MD  Electronically Signed     WED/MEDQ  D:  03/13/2006  T:  03/13/2006  Job:  [id number]   cc:   Thomas C. Daleen Squibb, MD, [facility]  Juline Patch, M.D.

## 2010-10-29 NOTE — Discharge Summary (Signed)
[facility]  Patient:    [name], [name]                           MRN: [id number] Adm. Date:  03/18/00 Disc. Date: 03/20/00 Attending:  Valetta Mole. Swords, M.D. [facility] CC:         Justine Null, M.D. [facility]   Discharge Summary  DISCHARGE DIAGNOSIS:  Atrial fibrillation, new onset.  Other medical problems as listed on dictated admission history and physical dated 03/18/00, by Dr. Amador Cunas.  DISCHARGE MEDICATIONS: 1.  Doxepin 10 mg p.o. q.d. 2.  Prednisone 35 mg p.o. q.d. 3.  Actos 15 mg p.o. q.d. 4.  Cardizem CD 240 mg p.o. q.d. 5.  Aciphex 20 mg p.o. q.d. 6.  Lipitor 10 mg p.o. q.d. 7.  Premarin 0.625 mg p.o. q.d.  HOSPITAL PROCEDURES: 1.  Echocardiogram, results pending. 2.  Chest x-ray on admission was normal.  HOSPITAL LABORATORIES:  Admission white blood cell count 15.9, hemoglobin 14.5, platelet count 325,000.  Hemoglobin A1C 5.3%.  TSH 3.043.  HOSPITAL COURSE:  The patient was admitted to the hospital service with new onset atrial fibrillation.  The patient was treated aggressively with heparin and p.o. Cardizem.  She also was treated with digoxin.  She spontaneously converted between February 18, 2000, and February 19, 2000.  She was feeling fine at the time of discharge.  Echocardiogram is pending.  FOLLOWUP PLANS:  Dr. Everardo All as scheduled next week.  She will also follow up with her dermatologist at [facility] as scheduled.  CONDITION ON DISCHARGE:  Baseline.  RESTRICTIONS:  None. DD:  03/20/00 TD:  03/20/00 Job: [id number]

## 2010-10-29 NOTE — H&P (Signed)
[name], [name]                  ACCOUNT NO.:  [id number]   MEDICAL RECORD NO.:  [id number]          PATIENT TYPE:  EMS   LOCATION:  MAJO                         FACILITY:  [facility]   PHYSICIAN:  Arvilla Meres, M.D. LHCDATE OF BIRTH:  17-May-1946   DATE OF ADMISSION:  10/23/2004  DATE OF DISCHARGE:                                HISTORY & PHYSICAL   PRIMARY CARE PHYSICIAN:  Sean A. Everardo All, M.D. [facility].   CARDIOLOGIST:  Learta Codding, M.D. [facility].   PATIENT IDENTIFICATION:  [name] is a 65 year old woman with multiple  medical problems including paroxysmal atrial fibrillation maintained on  Sotalol who is admitted through the ER with recurrent atrial fibrillation  with rapid ventricular response.   She says over the past couple of weeks, she has had multiple episodes of her  :heart going out of rhythm.  These have been getting more frequent.  Tonight she was at home holding her grandchild when she developed  palpitations and mild chest discomfort.  She also had a mild lightheadedness  but no syncope.  She took her blood pressure and noticed it was 166/111 with  a heart rate of 135, so she came to the ER.  On arrival to the ER, she was  found to have paroxysmal atrial fibrillation with rapid ventricular  response.  We were called to admit her.  She states that she has been taking Sotalol and Coumadin as directed.  Her  INRs recently have been mildly sub-therapeutic.  It was 1.9 on Oct 20, 2004,  and she also says that the time before that it was slightly low.  She thinks  it was 1.8.   REVIEW OF SYSTEMS:  She has had mild dyspnea on exertion but this is  relatively unchanged.  No orthopnea, PND, bleeding, melena, fevers, chills,  or cough.  All other systems are negative except per HPI.   PAST MEDICAL HISTORY:  1.  Paroxysmal atrial fibrillation.      1.  Maintained on Sotalol since July 2002.  2.  Chest pain.      1.  Cardiac catheterization, April 2002, showed an EF of 50%.   There was          a 50% lesion in the proximal LAD and an 80% ostial lesion of a large          diagonal.  Otherwise no significant coronary disease.  This was          managed medically.      2.  Cardiolite, on August 2005, showed an EF of 78% with no ischemia or          scarring.  3.  Dermatomyositis on methotrexate.  4.  Morbid obesity.  5.  Diabetes mellitus, steroid induced.  6.  CHF secondary to diastolic dysfunction.  7.  Hypertension.  8.  Hyperlipidemia.  9.  Gastroesophageal reflux disease.  10. Osteoarthrosis.   CURRENT MEDICATIONS:  1.  Sotalol 80 mg p.o. b.i.d.  2.  Digoxin 0.125 mg a day.  3.  Aspirin 81 mg a day.  4.  Coumadin 2.5 mg every Sunday, Monday, Wednesday, and Friday, and 5 mg      Tuesday, Thursday, and Saturday.  5.  Potassium chloride 20 mEq p.o. b.i.d.  6.  Synthroid 50 mcg p.o. every day.  7.  Lasix 40 mg p.o. b.i.d.  8.  Nexium 20 mg p.o. every day.  9.  Altace 5 mg p.o. every day.  10. Calcium with vitamin D 600/200 p.o. b.i.d.  11. Zetia 10 mg a day.  12. Methotrexate 5 mg p.o. every Tuesday.  13. Hydroxychloroquine 400 mg every day.  14. Prednisone 10 mg every day.  15. Actonel one tablet every Wednesday.  16. She is on multiple creams for her dermatomyositis.   DRUG ALLERGIES:  She has no known drug allergies.   SOCIAL HISTORY:  She lives in [place] with her husband.  She is retired.  She has two children and six grandchildren.  She denies any history of  tobacco, alcohol use, or illicit drug use.   FAMILY HISTORY:  Significant for coronary artery disease and hypertension.   PHYSICAL EXAMINATION:  GENERAL:  She is lying flat in bed in no acute  distress.  She appears older than her stated age.  Her respirations are  unlabored.  VITAL SIGNS:  She is afebrile.  Blood pressure is 119/71.  Her heart rate is  irregular ranging from 105 to 132.  She is sating 100% on 2 liters nasal  cannula.  HEENT:  Sclerae are anicteric.  EOMI.   There is no xanthelasma.  Mucous  membranes are moist.  NECK:  Supple.  It is hard to assess her JVP but appears flat.  Carotids are  2+ bilaterally without any bruits.  There is no lymphadenopathy or  thyromegaly.  CARDIAC:  She has an irregular irregular rhythm.  She is tachycardic.  There  is no obvious murmurs, rubs, or gallops.  LUNGS:  Clear to auscultation.  ABDOMEN:  Obese, nontender, nondistended.  I am unable to appreciate any  hepatosplenomegaly.  There is no bruits or appreciable masses.  EXTREMITIES:  Warm and pale.  There is no cyanosis or clubbing.  She has  trace edema bilaterally.  Distal pulses are strong.  NEURO:  She is alert and oriented x 3.  Otherwise nonfocal.  Cranial nerves  are intact.  She moves all four extremities without any difficulty.   LABS:  She has a white count of 7.2, hematocrit of 39.3, hemoglobin of 12.6,  platelets of 341.  INR is 2.4.  PTT is 42.  CK-MB is 1.1.  Troponin is less  than 0.05 on point-of-care markers.  EKG shows atrial fibrillation with  rapid ventricular response at a rate of 125 beats per minute.  There is  nonspecific ST-T wave changes.  QTc interval is 421 msec.  Chest x-ray is  pending.   ASSESSMENT/PLAN:  1.  Recurrent paroxysmal atrial fibrillation with rapid ventricular      response.  Given recent sub-therapeutic INRs, I am a bit hesitant to      increase her Sotalol to 120 mg b.i.d. at this time.  We will start      intravenous diltiazem  for rate control and I suspect she will revert to      a normal sinus rhythm spontaneously.  Once she does, I would consider      increasing her Sotalol to 120 mg b.i.d.  Otherwise if she remains in      atrial fibrillation, I would  consider TEE prior to increasing Sotalol dose.  We will continue her      Coumadin dose and check a TSH.  2.  Dermatomyositis.  This is currently stable.  Continue current      medications. 3.  Hypertension.  Stable.  Continue current  medications.      DB/MEDQ  D:  10/24/2004  T:  10/24/2004  Job:  [id number]   cc:   Gregary Signs A. Everardo All, M.D. [facility]   Learta Codding, M.D. [facility]

## 2010-10-29 NOTE — Assessment & Plan Note (Signed)
 HEALTHCARE                            CARDIOLOGY OFFICE NOTE   [name]                         MRN:          [id number]  DATE:09/20/2006                            DOB:          1945/08/09    REFERRING PHYSICIAN:  Jesse Sans. Wall, MD, [name] comes in today for further management of her paroxysmal atrial  fibrillation, well controlled on sotalol, and diastolic dysfunction with  chronic dyspnea. She is also on anticoagulation.   She has had shingles involving the abdominal wall just above her  umbilicus. She has even had acupuncture for this for the pain.   She has had no symptoms of atrial fibrillation or chest discomfort.   Her medications are outlined on the chart and are essentially unchanged.   PHYSICAL EXAMINATION:  VITAL SIGNS:  Her blood pressure today is 140/77,  pulse is 69. EKG shows sinus rhythm with no changes in her intervals  including her QTC. Her weight is 235.  HEENT:  Normocephalic, atraumatic. PERRLA. Extraocular movements intact.  Sclera clear. Facial symmetry is normal.  LUNGS:  Clear.  HEART:  Revealed a soft S1, S2 without gallop or rub.  ABDOMEN:  Soft with good bowel sounds. There is no sign of any zoster  rash.  EXTREMITIES:  Reveal trace pitting edema but mostly a lot of adipose  doughy consistency.  NEUROLOGIC:  Intact.   ASSESSMENT/PLAN:  [name] is doing well. I have made no changes in her  program. We will plan on seeing her back in 6 months.     Thomas C. Daleen Squibb, MD, [facility]  Electronically Signed    TCW/MedQ  DD: 09/20/2006  DT: 09/20/2006  Job #: [id number]   cc:   Juline Patch, M.D.

## 2010-10-29 NOTE — Discharge Summary (Signed)
[name], [name]                            ACCOUNT NO.:  [id number]   MEDICAL RECORD NO.:  [id number]                   PATIENT TYPE:  INP   LOCATION:  5016                                 FACILITY:  [facility]   PHYSICIAN:  Rene Paci, M.D. [facility]          DATE OF BIRTH:  1945-07-09   DATE OF ADMISSION:  06/16/2002  DATE OF DISCHARGE:  06/18/2002                                 DISCHARGE SUMMARY   DISCHARGE DIAGNOSES:  1. Left lower lobe pneumonia.  2. Mild leukocytosis.   HISTORY OF PRESENT ILLNESS:  The patient is a 65 year old white female who  presented with ill-defined epigastric pain with some radiation to the chest  and upper back.  She also described some lower abdominal discomfort.  She  presented to the emergency room where she was found to have a left lower  lobe infiltrate.  She was started on Rocephin and Zithromax and admitted for  further evaluation.   PAST MEDICAL HISTORY:  1. Paroxysmal atrial fibrillation.  2. Irritable bowel syndrome.  3. History of congestive heart failure.  4. Dermatomyositis on chronic steroids.  5. Diabetes induced by steroids.  6. Coronary artery disease status post Cardiolite in April of 2003 that was     negative with a history of catheterization in 2002.  7. Hypothyroidism.   HOSPITAL COURSE:  PROBLEM #1 - INFECTIOUS DISEASE:  The patient presented  with fever with a mild leukocytosis at 12,000.  She had a left lower lobe  pneumonia on chest x-ray.  She was started on Rocephin and Zithromax.  She  received three days of Rocephin in the hospital and then was converted to  p.o.  The patient's O2 saturations are greater than 95% on room air.  Follow-  up chest x-ray from June 17, 2002, is still pending.   PROBLEM #2 - CARDIOVASCULAR:  The patient has chronic atrial fibrillation.  Her anticoagulation was supratherapeutic on admission with a protime of 34.1  and INR of 4.5.  Currently her Coumadin is being held.  There has been  no  evidence of any acute cardiac process.  Serial cardiac enzymes were  negative.  As reported, she had negative Cardiolite in April of 2003.   LABS AT DISCHARGE:  Chest x-ray for June 17, 2002, is still pending.   LFTs were normal.  Lipase was mildly elevated at 56.  Cardiac enzymes were  negative.  Protime 34.8, INR 4.6.  White count 12.7, hemoglobin 11.  Blood  cultures were negative.   DISCHARGE MEDICATIONS:  She is to resume her home medications which are as  follows:  1. Prednisone 10 mg q.d.  2. Hydroxychloroquine 400 mg q.d.  3. Sotalol 80 mg b.i.d.  4. Altace 2 mg q.d.  5. Coumadin 5 mg daily as at home.  6. Lasix 40 mg b.i.d.  7. Zetia 10 mg q.d.  8. Aspirin 81 mg q.d.  9. Potassium  chloride 20 mEq b.i.d.  10.      Levothroid 0.5 mg q.d.  11.      Actos 15 mg q.d.  12.      Premarin 0.625 mg q.d.  13.      Nexium 20 mg q.d.  14.      Actonel 325 mg as at home.  15.      Ceftin 250 mg b.i.d. x7 days.  16.      Zithromax 350 mg q.d. for two more days on Wednesday and Thursday.   FOLLOW UP:  The patient will follow up with Dr. Everardo All one to two weeks and  follow-up next week for a protime.     Cornell Barman, P.A. [facility]                  Rene Paci, M.D. [facility]    LC/MEDQ  D:  06/18/2002  T:  06/18/2002  Job:  [id number]

## 2010-10-29 NOTE — H&P (Signed)
[facility]  Patient:    [name], [name]                         MRN: [id number] Adm. Date:  [id number] Disc. Date: [id number] Attending:  Tobey Bride                         History and Physical  CHIEF COMPLAINT:  Palpitations.  HISTORY OF PRESENT ILLNESS:  The patient is a 65 year old white female who was stable until the evening of admission when she developed rapid regular palpitations of sudden onset.  This occurred while she was sitting at the dinner table and because of persistent rapid heart rate, presented to the emergency room.  She complained of some vague chest discomfort, but also pain up into the jaw and head area.  The patient was evaluated in the emergency room, where she was noted to have new onset atrial fibrillation with a rapid ventricular response.  In the ED, the patient was treated with IV Lanoxin and IV Cardizem for rate control and is now admitted for further evaluation and treatment.  The patient has been diagnosed as having hypertension earlier this year and has been on nifedipine extended release 30 mg daily.  Also this year the patient has recently been diagnosed and treated for dermatomyositis.  The patient denies any prior history of arrhythmias.  She states, however, that due to some chest pain, she did have a Cardiolite stress test performed in July or August of this year.  This apparently was negative.  PAST MEDICAL HISTORY:  As mentioned he has a history of hypertension and dermatomyositis, both diagnosed early this year.  She has a remote history of pancreatitis.  She is a gravida 2, para 2, aborta 0.  She has had a remote hysterectomy and also ENT surgery for a deviated septum.  Other medical illnesses include diabetes and hypercholesterolemia.  Her present medical regimen includes Doxepin 10 mg h.s., prednisone 5 mg daily, Actose 15 mg daily, _________  extended release 30 mg daily, Aciphex 20 mg daily, Lipitor 10 mg  daily, Premarin 0.625 mg daily, and calcium and Vitamin D supplementation.  SOCIAL HISTORY:  The patient is married, two children.  She is fully employed. She does complain of some increased job stress.  She is a nondrinker, nonsmoker.  FAMILY HISTORY:  Father died of rheumatic heart disease.  Mother is living and doing reasonably well with a history of hypertension.  Two brothers and a nephew died at early ages of acute MI.  REVIEW OF SYSTEMS:  Otherwise fairly noncontributory.  Her rheumatologic condition apparently has been fairly stable, and her proximal muscle weakness and dermatitis has been quite stable.  PHYSICAL EXAMINATION:  VITAL SIGNS:  Blood pressure was 122/80, pulse rate variable from 120 to 160, respiratory rate and temperature normal.  SKIN:  Unremarkable without dermatitis.  HEENT:  Revealed normal pupil responses, conjunctivae clear.  ENT unremarkable.  NECK:  Revealed no adenopathy, thyroid enlargement, or bruits.  CHEST:  Clear.  CARDIOVASCULAR:  Revealed a rapid, irregular rhythm.  No murmurs could be appreciated.  BREASTS:  Negative.  ABDOMEN:  Soft, obese, nontender, no organomegaly.  EXTREMITIES:  Revealed intact peripheral pulses, no edema.  NEUROLOGIC:  Negative.  IMPRESSION:  New onset atrial fibrillation.  ADDITIONAL DIAGNOSES: 1. Hypertension. 2. Dermatomyositis. 3. History of diabetes. 4. Hypercholesterolemia.  DISPOSITION:  The patient will be admitted to a telemetry  setting.  She will be placed on IV Lanoxin, IV Cardizem for rate control.  Patient will also be placed on IV heparinization.  If there is not spontaneous conversion to a normal sinus rhythm, the patient will be considered for electrical or medical cardioversion in the morning. DD:  03/18/00 TD:  03/19/00 Job: [id number]

## 2010-10-29 NOTE — Discharge Summary (Signed)
[name], [name]                  ACCOUNT NO.:  [id number]   MEDICAL RECORD NO.:  [id number]          PATIENT TYPE:  INP   LOCATION:  2025                         FACILITY:  [facility]   PHYSICIAN:  Salvadore Farber, M.D. LHCDATE OF BIRTH:  [birth year]   DATE OF ADMISSION:  10/23/2004  DATE OF DISCHARGE:  10/25/2004                                 DISCHARGE SUMMARY   PROCEDURES:  None.   DISCHARGE DIAGNOSES:  1.  Paroxysmal atrial fibrillation, converted spontaneously to sinus rhythm      and Toprol XL added.  2.  History of paroxysmal atrial fibrillation on sotalol since 2002.  3.  History of chest pain with 50% left anterior descending and 80%      diagonal, medical therapy.  4.  Status post Cardiolite in 2005 with an ejection fraction of 78% and no      ischemia.  5.  Dermatomyositis.  6.  Morbid obesity.  7.  Steroid-induced diabetes.  8.  Congestive heart failure secondary to diastolic dysfunction.  9.  Hypertension.  10. Hyperlipidemia.  11. Gastroesophageal reflux disease symptoms.  12. Chronic anticoagulation with Coumadin.  13. Osteoarthritis.  14. History of surgery for a deviated septum.  15. History of cataract removal and hysterectomy.  16. Hypothyroidism.   HOSPITAL COURSE:  [name] is a 65 year old female with a history of  nonobstructive coronary artery disease and PAF.  She has had episodes of her  heart going out of rhythm and this happened again on the day of admission.  Her blood pressure was elevated.  Her heart rate was in the 130s.  She came  into the emergency room and was admitted for further evaluation and  treatment.   She was continued on her home dose of sotalol.  She converted spontaneously  to sinus rhythm on Oct 24, 2004.  She had occasional PVCs, but no other  significant arrhythmia.  She felt well.  Her cardiac enzymes were negative.  Her QTc was within normal limits.   On Oct 25, 2004 [name] was evaluated by Dr. Samule Ohm who felt she was  stable  for discharge with the addition of Toprol XL to her medication regimen and  follow-up with Dr. Andee Lineman.   DISCHARGE INSTRUCTIONS:  Her activity level is to be as tolerated.  She is  to stick to a low fat diabetic diet.  She is to follow up with Dr. Andee Lineman on  May 31 at 2:15 p.m.  She is to follow up with Dr. Everardo All as needed.   DISCHARGE MEDICATIONS:  1.  Sotalol 80 mg p.o. b.i.d.  2.  Toprol XL 25 mg a day.  3.  Digoxin 0.125 mg daily.  4.  Aspirin 81 mg daily.  5.  Coumadin 2 mg four days a week and 5 mg on Tuesday, Thursday, Saturday.  6.  K-Dur 20 mEq b.i.d.  7.  Synthroid 50 mcg daily.  8.  Lasix 40 mg b.i.d.  9.  Nexium 20 mg daily.  10. Altace 5 mg daily.  11. Calcium with D b.i.d.  12. Zetia 10  mg daily.  13. Methotrexate 5 mg a week.  14. Hydrochloroquine 400 mg daily.  15. Prednisone 10 mg daily.  16. Actonel every week.  17. Creams for dermatomyositis.      RB/MEDQ  D:  10/25/2004  T:  10/25/2004  Job:  [id number]   cc:   Gregary Signs A. Everardo All, M.D. [facility]

## 2010-10-29 NOTE — H&P (Signed)
[place]. [facility]  Patient:    [name], [name]                        MRN: [id number] Adm. Date:  08/22/00 Attending:  Sonda Primes, M.D. CC:         Justine Null, M.D.                         History and Physical  DATE OF BIRTH:  1946/01/13  CHIEF COMPLAINT:  Chest pain.  HISTORY OF PRESENT ILLNESS:  The patient is a 65 year old white female with known coronary artery disease who was in the lab this morning when she developed pressure like anterior chest pain going to both shoulders and back. She rates it 5/10 in intensity.  The pain went down to 3/10 after two nitroglycerin.  She also reported palpitations/weakness.  PAST MEDICAL HISTORY:  Coronary artery disease.  She had a heart catheterization done by Dr. Juanda Chance on 08/14/00 with 50% LAD lesion and 80% large diagonal lesion.  She was discharged on medical therapy with plans to obtain stress test.  There was potential for angioplasty if she becomes symptomatic.  She has dermatomyositis on steroids.  She has steroid induced diabetes.  She has gastroesophageal reflux disease.  ALLERGIES:  No known drug allergies.  SOCIAL HISTORY:  She is married and does not smoke or drink.  CURRENT MEDICATIONS:  1. Lipitor 20 mg a day.  2. Premarin 0.625 mg a day.  3. Calcium and Vitamin D.  4. Prednisone 5 mg daily.  5. ______ 240 mg daily.  6. Enalapril 2.5 mg two a day.  7. Lasix 20 mg daily.  8. Lopressor 50 mg one-quarter b.i.d.  9. Nitroglycerin p.r.n. 10. Actos 50 mg daily. 11. Aciphex 20 mg daily.  FAMILY HISTORY:  Positive for coronary artery disease.  REVIEW OF SYSTEMS:  Chest pain as above.  No GERD symptoms lately.  No exertional symptoms.  The rest is as above or negative.  PHYSICAL EXAMINATION:  VITAL SIGNS:  Blood pressure 96/50, pulse 68, temperature 97.4.  GENERAL:  She is in no acute distress.  She is overweight.  HEENT:  Moist mucosa.  NECK:  Supple, no thyromegaly  or bruits.  LUNGS:  Clear to auscultation and percussion.  HEART:  S1 and S2 distant tones.  No gallop or murmur.  ABDOMEN:  Obese, soft, nontender, no organomegaly, no masses.  EXTREMITIES:  Lower extremities without edema.  NEUROLOGIC:  Cranial nerves 2-12 are normal.  Deep tendon responses are normal.  She is alert, oriented and cooperative.  Denies being depressed.  LABS:  EKG was normal sinus rhythm with minor ST changes.  Cardiac catheterization on 08/14/00 as described above.  ASSESSMENT AND PLAN: 1. Chest pain with 50% LAD and 80% large diagonal branch lesions.  The    symptoms are consistent with unstable angina.  Will admit for IV    Heparin.  She will need cardiology consultation.  Labs to rule out    myocardial infarction. 2. Dermatomyositis on steroids.  Will continue. 3. Gastroesophageal reflux disease continue with Aciphex. 4. Diabetes mellitus type II.  Will continue with Actos, sliding scale    insulin. 5.  Hypertension on therapy. DD:  08/22/00 TD:  08/22/00 Job: [id number]

## 2010-10-29 NOTE — Discharge Summary (Signed)
[name], SPOONEMORE                  ACCOUNT NO.:  [id number]   MEDICAL RECORD NO.:  [id number]          PATIENT TYPE:  INP   LOCATION:  2034                         FACILITY:  [facility]   PHYSICIAN:  Salvadore Farber, MD  DATE OF BIRTH:  [birth year]   DATE OF ADMISSION:  03/07/2006  DATE OF DISCHARGE:  03/13/2006                                 DISCHARGE SUMMARY   PRIMARY CARDIOLOGIST:  Maisie Fus C. Daleen Squibb, MD, [facility]   PRIMARY CARE PHYSICIAN:  Juline Patch, M.D.   PRINCIPAL DIAGNOSIS:  Chest pain.   SECONDARY DIAGNOSES:  Coronary artery disease, paroxysmal atrial  fibrillation on chronic Coumadin anticoagulation and sotalol therapy,  hypertension, hyperlipidemia, osteoporosis, degenerative joint disease,  dermatomyositis on methotrexate, morbid obesity, steroid induced diabetes  mellitus which is diet controlled, CHF secondary diastolic dysfunction,  GERD/hiatal hernia, pancreatitis in 1975, status post total abdominal  hysterectomy, hypothyroidism.   ALLERGIES:  No known drug allergies.   PROCEDURES:  Left heart cardiac catheterization.   HISTORY OF PRESENT ILLNESS:  A 65 year old female with prior history of  moderate nonobstructive coronary artery disease by cardiac catheterization  in 2002 who was in her usual state of health until 03/07/2006 when she began  to experience chest pain at rest which radiation to her left neck and left  shoulder without associated symptoms.  Secondary to persistence of symptoms  to presented to [facility] ED on 03/07/2006 and was admitted for further  evaluation.   HOSPITAL COURSE:  Ms. Larabee ruled out for MI by cardiac markers x4.  Her EKG  was without any acute changes.  The decision was made to pursue left heart  cardiac catheterization once her INR was less than or equal to 1.7 and her  Coumadin was placed on hold.  Unfortunately it took some time for her INR to  come down and she underwent catheterization on 03/13/2006 revealing a 70%  lesion in  the very small less then 2 mm first diagonal branch as well as a  40% proximal stenosis in the LAD and otherwise normal coronary arteries.  EF  was 65% without regional wall motion abnormalities.  Because there has been  no change in her coronary anatomy since catheterization 2005 we have made no  changes to her medical regime and in additional invasive cardiac evaluation  is necessary.  She will discharged home today in satisfactory condition.  Notably on exam she is found to have a left carotid bruit for which she is  scheduled for an outpatient carotid ultrasound on 03/20/2006.   DISCHARGE LABS:  Hemoglobin 10.9, hematocrit 31.9, WBC 6.3, platelets  208,000.  Sodium 144, potassium 4.1, chloride 108, carbon dioxide 31, BUN  13, creatinine 1.2, glucose 86, PT 15.8, INR 1.2, total bilirubin 0.6,  alkaline phosphatase 65, AST 53, ALT 34, albumin 3.3.  Cardiac markers  negative x4.  Total cholesterol 204, triglycerides 113, HDL 54, LDL 127,  calcium 9.1, TSH 1.497, digoxin is pending.  BNP was 88.0.   DISPOSITION:  Patient is being discharged home today in good condition.   FOLLOWUP PLANS AND APPOINTMENTS:  She has Coumadin clinic follow up on  03/17/2006 at 9 am.  She has carotid ultrasound at [facility] on  03/20/2006 at 730 am.  She has follow up with Dr. Valera Castle on 04/05/2006  at 945 am.   DISCHARGE MEDICATIONS:  Aspirin 81 mg daily, Coumadin as previously  prescribed that is 2 mg q Monday and 5 mg every other day, Prednisone 10 mg  daily, Hydroxychloroquine 100 mg daily, Digitek 0.125 mg daily, methotrexate  12.5 mg q week, Sotalol 120 mg daily, Altace 2.5 mg daily, Lasix 40 mg  b.i.d., Zetia 10 mg daily, Synthroid 50 mcg daily, Calcium +D 600/200 mg  b.i.d., Actonel 35 mg q week, Protonix 40 mg daily, potassium 40 mEq daily,  Nitroglycerine 0.4 mg sublingual prn chest pain.   OUTSTANDING LABS:  None.   DURATION DISCHARGE TIME:  45 minutes including physician  time.     ______________________________  [name], ANP      Salvadore Farber, MD  Electronically Signed    CB/MEDQ  D:  03/13/2006  T:  03/14/2006  Job:  [id number]   cc:   Juline Patch, M.D.

## 2010-10-29 NOTE — Discharge Summary (Signed)
[place]. [facility]  Patient:    [name], [name]                         MRN: [id number] Adm. Date:  [id number] Disc. Date: [id number] Attending:  Donnetta Hutching CC:         Justine Null, M.D. [facility]   Discharge Summary  DISCHARGE DIAGNOSIS: Atypical chest pain.  HISTORY OF PRESENT ILLNESS:  The patient is a 65 year old white female who presented with the chest pain.  For the details, please address to my history and physical on August 22, 2000.  HOSPITAL COURSE:  She was admitted to rule out MI. She underwent Cardiolite stress test which was negative.  Ejection fraction was 74%.  Titus Dubin. Alwyn Ren, M.D., increased her Aciphex to b.i.d. and discharged her home with a tentative GI consult. DD:  12/05/00 TD:  12/06/00 Job: [id number]

## 2010-10-29 NOTE — H&P (Signed)
NAME:  [name], [name]                            ACCOUNT NO.:  [id number]   MEDICAL RECORD NO.:  [id number]                   PATIENT TYPE:  INP   LOCATION:  1829                                 FACILITY:  [facility]   PHYSICIAN:  Wanda Plump, MD [facility]                 DATE OF BIRTH:  Jul 03, 1945   DATE OF ADMISSION:  06/16/2002  DATE OF DISCHARGE:                                HISTORY & PHYSICAL   CHIEF COMPLAINT:  Fever.   HISTORY OF PRESENT ILLNESS:  The patient is a 65 year old white female with  multiple medical problems who has started to have ill-defined epigastric  pain with some radiation to the chest and the upper back.  She also  describes some lower abdominal discomfort.  Her symptoms were decreased by  food intake but shortly after they came back.  She also has developed fever  that she is having a hard time controlling with Tylenol by mouth.  At the ER  she was found to have left lower lobe infiltrate.  She was started on  Rocephin and Zithromax and admitted for further care.   PAST MEDICAL HISTORY:  1. Paroxysmal atrial fibrillation.  2. IBS.  3. CHF.  4. Dermatomyositis for which she takes prednisone.  5. Diabetes induced by steroids.  6. History of coronary artery disease.  She had a Cardiolite back in April     2003 and it was reportedly negative.   SOCIAL HISTORY:  Negative for tobacco and alcohol.   FAMILY HISTORY:  Positive for coronary artery disease and diabetes.   MEDICATIONS:  1. Prednisone 5 mg two p.o. q.d.  2. Sotalol 80 mg one p.o. b.i.d.  3. Altace 10.4.  4. Coumadin 5 mg p.o. q.d.  5. Lasix 40 mg p.o. b.i.d.  6. Aspirin one a day.  7. Potassium 20 mEq p.o. b.i.d.  8. Centrum 0.05 q.d.  9. Actos 15 mg q.d.  10.      Zetia 10 mg q.d.  11.      Actonel 35 mg q.d.  12.      Starlix 120 mg, 1/2 q.d.  13.      Premarin 0.625 mg/g q.d.  14.      Nexium 40 mg q.d.  15.      __________ p.r.n.   REVIEW OF SYSTEMS:  She denies any shortness of breath,  cough, dysuria,  headaches or leg swelling.  No nausea, vomiting or diarrhea.  Her appetite  has been poor, and her blood sugar has been well controlled.   ALLERGIES:  No known drug allergies.   PHYSICAL EXAMINATION:  GENERAL:  The patient is alert, oriented, in no  apparent distress.  VITALS:  Her oxygen saturation initially was around 92, with oxygen went up  to 99%.  Blood pressure 138/58, pulse 86, temperature 101.1, respirations  20.  NECK:  No JVD and full  range of motion.  LUNGS:  Fairly clear.  CARDIOVASCULAR:  Regular rhythm without murmur.  ABDOMEN:  Not distended, soft with good bowel sounds, it is slightly tender  in the epigastric area without mass or rebound.  EXTREMITIES:  Show no pitting edema.  NEUROLOGICAL:  Speech is clear.  Motor strength is symmetric.  The patient  is coherent and cooperative.   LABORATORY DATA:  UA was basically negative.  Sodium 138, potassium 3.5, BUN  15, creatinine 0.9, blood sugar 79.  White count 10.9, hemoglobin 11.5,  platelets 208, INR 4.6.  One set of cardiac enzymes is negative.  Lipase 57  which is slightly high.  Liver function tests were normal.  Chest x-ray  shows questionable infiltrate on the left lower lung.   ASSESSMENT/PLAN:  1. Fever, most likely source is the infiltrate in the chest x-ray.  We will     continue with the Rocephin and Zithromax and we will check a chest x-ray     in the morning.  2. Atypical abdominal pain and chest pain.  We will follow up this problem     clinically.  We will check serial cardiac enzymes given the history of     diabetes and atypical chest pain.  3. Diabetes.  This seems to be well controlled.  4. Atrial fibrillation.  Patient is currently in sinus.  Will continue with     present care.  5. Dermatomyositis.  Will continue with __________  by mouth keeping in mind     that if she has any symptoms of renal failure, we will give her trace     doses of steroids.                                                Wanda Plump, MD [facility]    JEP/MEDQ  D:  06/17/2002  T:  06/17/2002  Job:  [id number]

## 2010-10-29 NOTE — Assessment & Plan Note (Signed)
[facility]                                 ON-CALL NOTE   [name], [name]                         MRN:          [id number]  DATE:07/30/2008                            DOB:          [birth year]    PRIMARY CARDIOLOGIST:  Jesse Sans. Wall, MD, [facility]   I received a page through the answering service from Ms. [name].  She  states she has been feeling bad, has checked her blood pressure at home,  it was 170/102.  She was unsure what to do.  She reviewed her  medications with me.  I instructed her to take an extra 2.5 of her  Altace tonight.  If she does not feel better or exhibit any symptoms  suggestive of neurological changes, I instructed her to come to the  emergency room to get evaluated, otherwise to give Dr. Vern Claude nurse a  phone call in the morning let her know where her blood pressure was  running and have him follow up with that for further management of her  hypertension.  She verbalized understanding and stated that she would.      Dorian Pod, ACNP  Electronically Signed      Arturo Morton. Riley Kill, MD, [facility]  Electronically Signed   MB/MedQ  DD: 07/30/2008  DT: 07/31/2008  Job #: [id number]

## 2010-10-29 NOTE — Discharge Summary (Signed)
[place]  Patient:    [name], [name]                         MRN: [id number] Adm. Date:  [id number] Disc. Date: [id number] Attending:  Corwin Levins CC:         Justine Null, M.D. [facility]  Lewayne Bunting, M.D.   Discharge Summary  DISCHARGE DIAGNOSES:  1. Chest pain, probable unstable angina.  2. Coronary artery disease.  3. Congestive heart failure.  4. History of dermatomyositis.  5. Gastroesophageal reflux disease.  6. Diabetes mellitus secondary to steroids.  7. Hypertension.  8. Hypercholesterolemia.  9. History of atrial fibrillation. 10. History of pneumonia and bronchitis. 11. History of remote pancreatitis.  CONSULTATIONS:  Cardiology, Dr. Lewayne Bunting.  PROCEDURE:  Cardiac catheterization, August 14, 2000, with LAD 50% proximal, 80% distal, normal circumflex and RCA, LV normal, ejection fraction approximately 50%.  With difficult anatomy, it was felt best that no intervention was attempted, and medical treatment was recommended.  ADMISSION HISTORY AND PHYSICAL:  Please see that dictated by myself date of admission.  HOSPITAL COURSE:  [name] is a very nice 65 year old white female who presented with chest discomfort and congestive heart failure which rapidly resolved with IV Lasix in a setting of diabetes and hypertension.  Dr. Michelle Piper [facility] was consulted; IV heparin begun.  Cardiac catheterization performed as above with anatomy recommendation as above.  She did well postcatheterization, ambulated in the room, right groin and pulses uncomplicated.  Slightly low potassium noted the day after catheterization, replaced with p.o.  Diabetes and blood pressure relatively well controlled. Lopressor 12.5 mg b.i.d. and Lasix 20 mg p.o. q.d. added to her outpatient regimen.  The plan is to change her over fully from calcium channel blocker to beta-blocker as an outpatient.  DISPOSITION:  Discharged to home in good  condition.  DISCHARGE INSTRUCTIONS:  Diet and activity as before.  She is given a note to be off work August 13, 2000, until August 21, 2000.  DISCHARGE FOLLOWUP:  She will follow up with Dr. Everardo All on a p.r.n. basis and follow up with Dr. Lewayne Bunting in four to six weeks, and they will call her with an appointment.  DISCHARGE MEDICATIONS:  Includes all those as an outpatient including: 1. Prednisone 17.5 mg p.o. q.d. 2. Cartia XT 240 mg p.o. q.d. 3. Diovan 80 mg p.o. q.d. 4. Actos 15 mg p.o. q.d. 5. Aciphex 20 mg p.o. q.d. 6. Lipitor 20 mg p.o. q.d. 7. Premarin 0.625 mg p.o. q.d. 8. Nizoral 250 mg p.o. q.d. x 3 weeks. 9. Calcium and vitamin D.  Includes addition of: 1. Lopressor 12.5 mg b.i.d. 2. Lasix 20 mg p.o. q.d. DD:  08/15/00 TD:  08/16/00 Job: [id number]

## 2010-10-29 NOTE — H&P (Signed)
[name], [name]                  ACCOUNT NO.:  [id number]   MEDICAL RECORD NO.:  [id number]          PATIENT TYPE:  INP   LOCATION:  4735                         FACILITY:  [facility]   PHYSICIAN:  Arvilla Meres, M.D. LHCDATE OF BIRTH:  11-15-1945   DATE OF ADMISSION:  04/01/2005  DATE OF DISCHARGE:                                HISTORY & PHYSICAL   PRIMARY CARE PHYSICIAN:  Sean A. Everardo All, M.D. [facility].   CARDIOLOGIST:  Learta Codding, M.D. [facility].   PATIENT IDENTIFICATION:  [name] is a 65 year old woman with multiple  medical problems including paroxysmal atrial fibrillation maintained on  Sotalol, who is admitted through the [facility] Emergency Room for recurrent  atrial fibrillation with rapid ventricular response with a heart rate in the  140s to 150s and mild chest pressure.   [name] is well known to me from her previous admission in May, when she  presented with a very similar presentation.  At that time, she had atrial  fibrillation with rapid ventricular response.  She was started on a Cardizem  drip and subsequently reverted to a normal sinus rhythm overnight and was  discharged with the addition of Toprol 25 mg a day.  Since that time, she  has been doing quite well.  She reports that she has only had very rare  episodes of brief breakthrough atrial fibrillation lasting just a few  minutes.  Tonight, she was at home when she had the onset of palpitations  about 5.  She called our on call physician's assistant who instructed her to  take an extra dose of her Toprol and call back if her symptoms persisted.  She did take an extra 25 mg of Toprol, however, her heart rates persisted in  the 140s and 150s.  At this point she also developed some very mild chest  pressure and she was thus told to come to the emergency room.   On arrival to the emergency room she was found to be in Afib with heart  rates in the 140s to 150s.  Her chest pressure had resolved.  She was given  20 mg  of intravenous Cardizem which slowed her heart rate down to about the  110 range.  In asking her about her INR, her last INR was about two weeks  ago at which time her INR was 2.5.  She is not exactly sure what it was  before that but thinks it was a little bit higher.   REVIEW OF SYSTEMS:  As per HPI and problem list.  She has had a little bit  of lower extremity edema which she manages well with Lasix.  She denies any  orthopnea or PND.  Her functional status is limited due to her weight.  She  denies any fevers, chills, nausea, vomiting, abdominal pain, bleeding,  melena, bright red blood per rectum.  She does endorse arthritis pain.  She  denies any syncope.  The rest of the review of systems is negative except  for as HPI and problem list.   PAST MEDICAL HISTORY:  1.  Paroxysmal atrial fibrillation.      1.  Maintained on sotalol 80 b.i.d. since July 2002.  2.  History of chest pain.      1.  Cardiac cath, April 2002, EF of 50%, 50% lesion of the proximal LAD,          and an 80% ostial lesion of a large diagonal, otherwise no          significant coronary disease.  This was managed medically.      2.  Cardiolite, August 2005, EF of 78% with no ischemia or scarring.  3.  Dermatomyositis on methotrexate.  4.  Morbid obesity.  5.  Diabetes mellitus, steroid-induced.  6.  CHF secondary to diastolic dysfunction.  7.  Hypertension.  8.  Hyperlipidemia.  9.  Osteoporosis.  10. Osteoarthritis.  11. Gastroesophageal reflux disease.   CURRENT MEDICATIONS:  1.  Sotalol 80 mg b.i.d.  2.  Digoxin 0.125 mg a day.  3.  Aspirin 81 mg a day.  4.  Coumadin 2.5 mg every Monday, Wednesday and Friday, and 5 mg Tuesday,      Thursday, Saturday, and Sunday.  5.  Potassium 20 mEq p.o. b.i.d.  6.  Synthroid 50 mcg a day.  7.  Lasix 40 mg b.i.d.  8.  Nexium 20 every day.  9.  Altace 5 mg a day.  10. Calcium with vitamin D 600/200 p.o. b.i.d.  11. Zetia 10 mg a day.  12. Methotrexate 12.5 mg a  day.  13. Hydrochloroquinine 400 mg every day.  14. Prednisone 10 mg a day.  15. Actonel one tablet every Wednesday.  16. Toprol XL 25 a day.  17. She is on multiple creams for her dermatomyositis.   DRUG ALLERGIES:  No known drug allergies.   SOCIAL HISTORY:  She lives in [place] with her husband.  She is retired.  She has two children and six grandchildren.  She denies any history of  tobacco, alcohol use, or illicit drug use.   FAMILY HISTORY:  Significant for multiple relatives with coronary artery  disease and hypertension.   PHYSICAL EXAMINATION:  GENERAL:  She is lying flat in bed in no acute  distress.  She appears older than her stated age.  Respirations are  unlabored.  VITAL SIGNS:  She is afebrile.  Blood pressure is 148/70, her heart rate is  regular ranging from the low 100s up to 120.  She is sating 100% on 2 liters  nasal cannula.  HEENT:  Sclerae are anicteric.  EOMI.  There are no xanthelasma.  Mucous  membranes are moist.  NECK:  Supple.  It is hard to assess for JVP but appears flat.  Carotids are  2+ bilaterally without any bruits.  There is no lymphadenopathy or  thyromegaly.  CARDIAC:  She is tachycardic with an irregularly irregular rhythm.  There is  no obvious murmurs, rubs, or gallops.  LUNGS:  Clear to auscultation.  ABDOMEN:  Obese, nontender, nondistended.  I am unable to appreciate any  hepatosplenomegaly or masses.  There are no bruits.  EXTREMITIES:  Warm and pale.  There is no obvious cyanosis, clubbing, or  edema, although her extremities are markedly obese.  Her distal pulses are  strong.  NEURO:  She is alert and oriented x3.  Cranial nerves II-XII are intact.  She moves all four extremities without any difficulty.  Her affect is  appropriate.   EKG shows atrial fibrillation at a ventricular rate in the 130s with  nonspecific ST-T wave changes.  Labs are currently pending.   ASSESSMENT/PLAN: 1.  Paroxysmal atrial fibrillation with  rapid ventricular response with      associated mild chest pain.  She will be admitted for rule out      myocardial infarction.  We will start her on an intravenous Cardizem      drip for rate control.  I suspect she will convert spontaneously.  Given      that this is her second episode in about five months requiring      hospitalization, I think it is reasonable to increase her sotalol to 120      mg b.i.d.  She will get a five dose load with careful monitoring of her      QT interval as well as magnesium.  Given that her INRs have been      therapeutic for the past several weeks and she is in and out of atrial      fibrillation on her own anyway, I do not think she needs a TEE before      increasing her sotalol dose.  2.  Chest pain, likely due to her rapid ventricular response.  She does have      known mild coronary disease.  We will rule her out and otherwise      continue her current regimen.  She is on Zetia for her cholesterol but      it would be nice to add a Statin if she could tolerate.  3.  Hypertension.  Continue her home medications, titrate as needed.      Arvilla Meres, M.D. [facility]  Electronically Signed     DB/MEDQ  D:  04/01/2005  T:  04/02/2005  Job:  [id number]   cc:   Gregary Signs A. Everardo All, M.D. [facility]  [address]  [place]  [place] [address]   Learta Codding, M.D. [facility]  [address]  [address]  [place]  [place] [address]

## 2010-11-11 ENCOUNTER — Ambulatory Visit (INDEPENDENT_AMBULATORY_CARE_PROVIDER_SITE_OTHER): Payer: 59 | Admitting: *Deleted

## 2010-11-11 DIAGNOSIS — I4891 Unspecified atrial fibrillation: Secondary | ICD-10-CM

## 2010-11-11 LAB — POCT INR: INR: 2.4

## 2010-12-09 ENCOUNTER — Ambulatory Visit (INDEPENDENT_AMBULATORY_CARE_PROVIDER_SITE_OTHER): Payer: 59 | Admitting: *Deleted

## 2010-12-09 DIAGNOSIS — I4891 Unspecified atrial fibrillation: Secondary | ICD-10-CM

## 2010-12-09 LAB — POCT INR: INR: 2.1

## 2010-12-27 ENCOUNTER — Encounter: Payer: Self-pay | Admitting: Internal Medicine

## 2011-01-10 ENCOUNTER — Telehealth: Payer: Self-pay | Admitting: Cardiology

## 2011-01-10 ENCOUNTER — Ambulatory Visit (INDEPENDENT_AMBULATORY_CARE_PROVIDER_SITE_OTHER): Payer: 59 | Admitting: *Deleted

## 2011-01-10 ENCOUNTER — Other Ambulatory Visit: Payer: Self-pay | Admitting: *Deleted

## 2011-01-10 DIAGNOSIS — I4891 Unspecified atrial fibrillation: Secondary | ICD-10-CM

## 2011-01-10 LAB — POCT INR: INR: 2

## 2011-01-10 MED ORDER — SOTALOL HCL 120 MG PO TABS: 120.0000 mg | ORAL_TABLET | Freq: Two times a day (BID) | ORAL | Status: DC

## 2011-01-10 NOTE — Telephone Encounter (Signed)
Walk In Pt Form " Pt Dropped off Prescription to be filled" sent to Message Nurse  01/10/11/km

## 2011-01-24 ENCOUNTER — Encounter: Payer: Self-pay | Admitting: Internal Medicine

## 2011-02-07 ENCOUNTER — Ambulatory Visit (INDEPENDENT_AMBULATORY_CARE_PROVIDER_SITE_OTHER): Payer: 59 | Admitting: *Deleted

## 2011-02-07 DIAGNOSIS — I4891 Unspecified atrial fibrillation: Secondary | ICD-10-CM

## 2011-02-07 LAB — POCT INR: INR: 3

## 2011-02-10 ENCOUNTER — Telehealth: Payer: Self-pay | Admitting: Cardiology

## 2011-02-10 NOTE — Telephone Encounter (Signed)
Pt calls today b/c of wide fluctuation in blood pressure and lisinopril side effects.Pt stopped lisinopril b/c of the constipation and "feeling weak".  She did take her lisinopril this am after finding bp  this am was 179/108.  Later today she felt weak and found bp  95/60 She has stopped lisinopril. She will call Dr. Ricki Miller office as she states he started her on the lisinopril.  She will keep a bp log and see Dr. Daleen Squibb on 02/15/11.  Mylo Red RN

## 2011-02-10 NOTE — Telephone Encounter (Signed)
179/108 bp reading this am about 6a, pls advise [phone]

## 2011-02-15 ENCOUNTER — Encounter: Payer: Self-pay | Admitting: Cardiology

## 2011-02-15 ENCOUNTER — Ambulatory Visit (INDEPENDENT_AMBULATORY_CARE_PROVIDER_SITE_OTHER): Payer: 59 | Admitting: Cardiology

## 2011-02-15 VITALS — BP 146/96 | HR 66 | Ht 60.0 in | Wt 244.8 lb

## 2011-02-15 DIAGNOSIS — R609 Edema, unspecified: Secondary | ICD-10-CM

## 2011-02-15 DIAGNOSIS — I2510 Atherosclerotic heart disease of native coronary artery without angina pectoris: Secondary | ICD-10-CM

## 2011-02-15 DIAGNOSIS — I4891 Unspecified atrial fibrillation: Secondary | ICD-10-CM

## 2011-02-15 NOTE — Assessment & Plan Note (Signed)
Stable. Study reviewed with her.

## 2011-02-15 NOTE — Assessment & Plan Note (Signed)
Stable. No change in treatment. Follow up in 6 mos.

## 2011-02-15 NOTE — Patient Instructions (Signed)
Your physician recommends that you schedule a follow-up appointment in: 6 months with Dr. Wall  

## 2011-02-15 NOTE — Progress Notes (Signed)
HPI [name] comes in for evaluation and management of her paroxysmal A. Fib and coronary disease. She remains on sotalol, digoxin and anticoagulations. She is having no symptoms of palpitations, presyncope or syncope. He's had no chest pain or angina.  Her last stress test October 04, 2010. Her ejection fraction 75% with no ischemia or infarction. I reviewed this with her today gave her a copy.  EKG today shows normal sinus rhythm, nonspecific ST segment changes, and no change. Past Medical History  Diagnosis Date  . Atrial fibrillation 09/20/2008  . HYPERTENSION, UNSPECIFIED 09/20/2008  . DYSPNEA ON EXERTION 09/20/2008  . LEG EDEMA, BILATERAL 09/20/2008  . Drug therapy     No past surgical history on file.  Family History  Problem Relation Age of Onset  . Heart attack    . Coronary artery disease      History   Social History  . Marital Status: Married    Spouse Name: N/A    Number of Children: N/A  . Years of Education: N/A   Occupational History  . Not on file.   Social History Main Topics  . Smoking status: Never Smoker   . Smokeless tobacco: Not on file  . Alcohol Use: No  . Drug Use: Not on file  . Sexually Active: Not on file   Other Topics Concern  . Not on file   Social History Narrative  . No narrative on file    Allergies  Allergen Reactions  . Butoconazole     Current Outpatient Prescriptions  Medication Sig Dispense Refill  . allopurinol (ZYLOPRIM) 300 MG tablet Take 300 mg by mouth daily.      Marland Kitchen amLODipine (NORVASC) 5 MG tablet 1 tablet Daily.      Marland Kitchen aspirin 81 MG tablet Take 81 mg by mouth daily.        . Calcium Carbonate (CALCARB 600) 1500 MG TABS Take 1 tablet by mouth daily.        . Cholecalciferol (VITAMIN D3) 2000 UNITS TABS Take by mouth daily.        . digoxin (LANOXIN) 0.125 MG tablet Take 1 tablet (0.125 mg total) by mouth daily.  90 tablet  3  . furosemide (LASIX) 40 MG tablet 1 tab in the AM and 1/2 tab in the PM       .  hydroxychloroquine (PLAQUENIL) 200 MG tablet Take 200 mg by mouth Twice daily.      . methotrexate (RHEUMATREX) 2.5 MG tablet Take 12.5 mg by mouth Once a week.      . Multiple Vitamin (MULTIVITAMIN) tablet Take 1 tablet by mouth daily.        . nitroGLYCERIN (NITROSTAT) 0.4 MG SL tablet Place 0.4 mg under the tongue every 5 (five) minutes as needed.        . pantoprazole (PROTONIX) 40 MG tablet Take 40 mg by mouth daily.      . potassium chloride SA (K-DUR,KLOR-CON) 20 MEQ tablet Take 20 mEq by mouth 2 (two) times daily.        . predniSONE (DELTASONE) 1 MG tablet Take 7 mg by mouth daily.       . sotalol (BETAPACE) 120 MG tablet Take 1 tablet (120 mg total) by mouth 2 (two) times daily.  180 tablet  3  . SYNTHROID 100 MCG tablet Take 112 mcg by mouth daily.       Marland Kitchen warfarin (COUMADIN) 5 MG tablet TAKE AS DIRECTED BY COUMADIN CLINIC  90 tablet  1    ROS Negative other than HPI.   PE General Appearance: well developed, well nourished in no acute distress,morbidly obese. HEENT: symmetrical face, PERRLA, good dentition  Neck: no JVD, thyromegaly, or adenopathy, trachea midline Chest: symmetric without deformity Cardiac: PMI poorly appreciated,RRR, normal S1, S2, no gallop or murmur Lung: clear to ausculation and percussion Vascular: all pulses full without bruits  Abdominal: nondistended, nontender, good bowel sounds, no HSM, no bruits Extremities: no cyanosis, clubbing or edema, no sign of DVT, no varicosities, doughy  Skin: normal color, no rashes Neuro: alert and oriented x 3, non-focal Pysch: normal affect Filed Vitals:   02/15/11 1616  BP: 146/96  Pulse: 66  Height: 5' (1.524 m)  Weight: 244 lb 12.8 oz (111.041 kg)    EKG  Labs and Studies Reviewed.   No results found for this basename: WBC, HGB, HCT, MCV, PLT      Chemistry   No results found for this basename: NA, K, CL, CO2, BUN, CREATININE, GLU   No results found for this basename: CALCIUM, ALKPHOS, AST, ALT,  BILITOT       No results found for this basename: CHOL   No results found for this basename: HDL   No results found for this basename: LDLCALC   No results found for this basename: TRIG   No results found for this basename: CHOLHDL   No results found for this basename: HGBA1C   No results found for this basename: ALT, AST, GGT, ALKPHOS, BILITOT   No results found for this basename: TSH

## 2011-03-07 ENCOUNTER — Ambulatory Visit (INDEPENDENT_AMBULATORY_CARE_PROVIDER_SITE_OTHER): Payer: 59 | Admitting: *Deleted

## 2011-03-07 DIAGNOSIS — I4891 Unspecified atrial fibrillation: Secondary | ICD-10-CM

## 2011-03-07 LAB — POCT INR: INR: 2.3

## 2011-03-10 ENCOUNTER — Encounter: Payer: Self-pay | Admitting: Internal Medicine

## 2011-03-10 ENCOUNTER — Other Ambulatory Visit: Payer: Self-pay | Admitting: Internal Medicine

## 2011-03-11 ENCOUNTER — Ambulatory Visit
Admission: RE | Admit: 2011-03-11 | Discharge: 2011-03-11 | Disposition: A | Discharge status: Home / Self Care | Payer: 59 | Source: Ambulatory Visit | Attending: Internal Medicine | Admitting: Internal Medicine

## 2011-03-14 HISTORY — PX: COLONOSCOPY: SHX174

## 2011-03-15 ENCOUNTER — Ambulatory Visit (INDEPENDENT_AMBULATORY_CARE_PROVIDER_SITE_OTHER): Payer: 59 | Admitting: Internal Medicine

## 2011-03-15 ENCOUNTER — Encounter: Payer: Self-pay | Admitting: Internal Medicine

## 2011-03-15 VITALS — BP 132/80 | HR 69 | Ht 60.0 in | Wt 243.5 lb

## 2011-03-15 DIAGNOSIS — K8020 Calculus of gallbladder without cholecystitis without obstruction: Secondary | ICD-10-CM

## 2011-03-15 DIAGNOSIS — D689 Coagulation defect, unspecified: Secondary | ICD-10-CM

## 2011-03-15 DIAGNOSIS — K5900 Constipation, unspecified: Secondary | ICD-10-CM

## 2011-03-15 MED ORDER — PEG-KCL-NACL-NASULF-NA ASC-C 100 G PO SOLR: 1.0000 | Freq: Once | ORAL | Status: DC

## 2011-03-15 NOTE — Progress Notes (Signed)
[name] 21-Jan-1946 MRN [id number]     History of Present Illness:  This is a 65 year old white female with dermatomyositis. She is due for a colonoscopy on the basis of increased risk for colon cancer. Her last exam in July 2007 was normal. She has recently developed constipation. She has taken MiraLax but has not been satisfied with that. She has a history of atrial fibrillation, and ejection fraction of 75%. Patient is on chronic Coumadin and is followed by Dr Daleen Squibb. Her last colonoscopy was done off Coumadin. She has a history of herpes zoster resulting in permanent hypersensitivity around the right costal margin. She has obstructive sleep apnea, morbid obesity and cholelithiasis. An ultrasound of the gallbladder in the past showed a solitary gallstone in an otherwise normal-appearing gallbladder. Her most recent ultrasound one week ago confirmed the presence of large gallstone but again showed a normal-appearing gallbladder. She has a history of pancreatitis of unknown etiology; possibly biliary. She is at high-risk for surgical intervention due to her comorbidities and was told to hold off on a cholecystectomy.   Past Medical History  Diagnosis Date  . Atrial fibrillation 09/20/2008  . HYPERTENSION, UNSPECIFIED 09/20/2008  . DYSPNEA ON EXERTION 09/20/2008  . LEG EDEMA, BILATERAL 09/20/2008  . Drug therapy   . Internal hemorrhoids   . GERD (gastroesophageal reflux disease)   . Hiatal hernia   . Dermatomyositis   . Morbid obesity   . Osteoarthritis   . Dyslipidemia   . Hypothyroidism   . Sleep apnea   . History of pancreatitis   . Congestive heart failure   . Shingles   . Gall stones   . Gout   . Deviated septum   . Hyperlipidemia   . Obesity   . Pancreatitis   . Continuous urine leakage    Past Surgical History  Procedure Date  . Total abdominal hysterectomy   . Nasal septum surgery     reports that she has never smoked. She has never used smokeless tobacco. She reports  that she does not drink alcohol or use illicit drugs. family history includes Colon cancer in her paternal uncle; Coronary artery disease in her father; Heart attack in her brother; and Kidney disease in her brother. Allergies  Allergen Reactions  . Butoconazole         Review of Systems: Denies dysphagia, odynophagia, positive for right upper quadrant abdominal pain. Occasional dyspepsia positive for constipation but negative for rectal bleeding  The remainder of the 10 point ROS is negative except as outlined in H&P   Physical Exam: General appearance  Well developed, in no , obese Eyes- non icteric. HEENT nontraumatic, normocephalic. Mouth no lesions, tongue papillated, no cheilosis. Neck supple without adenopathy, thyroid not enlarged, no carotid bruits, no JVD. Lungs Clear to auscultation bilaterally. Cor normal S1, normal S2, regular rhythm, no murmur,  quiet precordium. Abdomen: Obese abdomen with normal active bowel sounds, very sensitive skin along the right costal margin to the mid back, no ascites. Difficult exam due to large size. Rectal: deferred. Extremities no pedal edema. Skin no lesions. Neurological alert and oriented x 3. Psychological normal mood and affect.  Assessment and Plan:  Problem #1 increased risk for colorectal cancer on the basis of dermatomyositis. We will schedule her for a followup colonoscopy with routine prep. She will hold her Coumadin for 5 days prior to the procedure.  Problem #2 cholelithiasis. This has been known for several years and she has been questionably symptomatic. She has a  history of pancreatitis of unknown etiology; possibly biliary. She is definitely at increased surgical risk. She now has hypersensitive skin over the right upper quadrant secondary to herpes zoster which impedes clinical assessment of her pain. A HIDA scan is a consideration. She is at the moment not interested in pursuing it.   03/15/2011 [name]

## 2011-03-15 NOTE — Patient Instructions (Addendum)
You have been scheduled for a colonoscopy. Please follow written instructions given to you at your visit today.  Please pick up your Moviprep kit at the pharmacy within the next 2-3 days. Please discontinue Coumadin 5 days prior to test as per Dr [name] CC:Dr Juline Patch, Dr Daleen Squibb

## 2011-03-16 ENCOUNTER — Telehealth: Payer: Self-pay | Admitting: Cardiology

## 2011-03-16 NOTE — Telephone Encounter (Signed)
I spoke with pt today and she is having a colonoscopy with possible polyp removal on 04/07/11. Pt states she was told to hold her Coumadin for 5 days prior to procedure. She would like to know if this is okay with Dr. Daleen Squibb.  Pt says she had this done 5 yrs ago and "Dr. Daleen Squibb did not want me off coumadin that long" I told pt I would call her back after Dr. Daleen Squibb has reviewed. Pt agrees with plan

## 2011-03-16 NOTE — Telephone Encounter (Signed)
Pt having colonoscopy on 10/25. Patient wanted to know is her heart O.K. To do this. Need to come off plavix for 5 days.

## 2011-03-17 ENCOUNTER — Telehealth: Payer: Self-pay | Admitting: Internal Medicine

## 2011-03-17 DIAGNOSIS — D689 Coagulation defect, unspecified: Secondary | ICD-10-CM

## 2011-03-17 MED ORDER — PEG-KCL-NACL-NASULF-NA ASC-C 100 G PO SOLR: 1.0000 | Freq: Once | ORAL | Status: DC

## 2011-03-17 NOTE — Telephone Encounter (Signed)
rx sent

## 2011-03-22 NOTE — Telephone Encounter (Signed)
She can come off Coumadin. Stay on ASA  And restart Coumadin after procedure.

## 2011-03-22 NOTE — Telephone Encounter (Signed)
I spoke with pt and she will hold coumadin for 5 days. Mylo Red RN

## 2011-03-23 ENCOUNTER — Telehealth: Payer: Self-pay | Admitting: Internal Medicine

## 2011-03-23 NOTE — Telephone Encounter (Signed)
Patient has been advised that we originally sent rx to [facility] at her office visit (that was the original pharmacy saved in the system). However, she called and stated that the Rx was not at the pharmacy so we sent rx to [facility] per her request. Apologized for the mix up. Patient very understanding. I have spoken to Maralyn Sago at [facility] who states that patient may bring the Moviprep back and if seal is not broken, they may be able to take the medication back. I have advised patient of this.

## 2011-03-29 ENCOUNTER — Ambulatory Visit: Payer: 59 | Admitting: Cardiology

## 2011-04-04 ENCOUNTER — Encounter: Payer: 59 | Admitting: *Deleted

## 2011-04-06 ENCOUNTER — Telehealth: Payer: Self-pay | Admitting: Pharmacist

## 2011-04-06 NOTE — Telephone Encounter (Signed)
Patient called to inform [facility] that her colonoscopy was postponed until 10/30.  Patient instructed to hold coumadin until after colonscopy.  A-Fib with last ECK in SR pt also verifies she feels no palpatations and has no stroke hx.  Follow up appt changed to 11/6.

## 2011-04-07 ENCOUNTER — Encounter: Payer: Self-pay | Admitting: Internal Medicine

## 2011-04-07 ENCOUNTER — Other Ambulatory Visit: Payer: 59 | Admitting: Internal Medicine

## 2011-04-07 ENCOUNTER — Ambulatory Visit (AMBULATORY_SURGERY_CENTER): Payer: 59 | Admitting: Internal Medicine

## 2011-04-07 VITALS — BP 125/54 | HR 66 | Temp 97.9°F | Resp 25 | Ht 60.0 in | Wt 243.0 lb

## 2011-04-07 DIAGNOSIS — K5900 Constipation, unspecified: Secondary | ICD-10-CM

## 2011-04-07 DIAGNOSIS — Z1211 Encounter for screening for malignant neoplasm of colon: Secondary | ICD-10-CM

## 2011-04-07 MED ORDER — SODIUM CHLORIDE 0.9 % IV SOLN: 500.0000 mL | INTRAVENOUS | Status: DC

## 2011-04-08 ENCOUNTER — Telehealth: Payer: Self-pay | Admitting: *Deleted

## 2011-04-08 NOTE — Telephone Encounter (Signed)

## 2011-04-12 ENCOUNTER — Other Ambulatory Visit: Payer: 59 | Admitting: Internal Medicine

## 2011-04-14 ENCOUNTER — Ambulatory Visit (INDEPENDENT_AMBULATORY_CARE_PROVIDER_SITE_OTHER): Payer: 59 | Admitting: *Deleted

## 2011-04-14 ENCOUNTER — Encounter: Payer: 59 | Admitting: *Deleted

## 2011-04-14 DIAGNOSIS — I4891 Unspecified atrial fibrillation: Secondary | ICD-10-CM

## 2011-04-14 LAB — POCT INR: INR: 2.6

## 2011-04-19 ENCOUNTER — Encounter: Payer: 59 | Admitting: *Deleted

## 2011-04-27 ENCOUNTER — Other Ambulatory Visit: Payer: Self-pay | Admitting: Cardiology

## 2011-05-26 ENCOUNTER — Ambulatory Visit (INDEPENDENT_AMBULATORY_CARE_PROVIDER_SITE_OTHER): Payer: 59 | Admitting: *Deleted

## 2011-05-26 DIAGNOSIS — I4891 Unspecified atrial fibrillation: Secondary | ICD-10-CM

## 2011-05-26 LAB — POCT INR: INR: 2.3

## 2011-07-01 ENCOUNTER — Telehealth: Payer: Self-pay | Admitting: Cardiology

## 2011-07-01 DIAGNOSIS — I4891 Unspecified atrial fibrillation: Secondary | ICD-10-CM

## 2011-07-01 MED ORDER — DIGOXIN 125 MCG PO TABS: 0.1250 mg | ORAL_TABLET | Freq: Every day | ORAL | Status: DC

## 2011-07-01 MED ORDER — FUROSEMIDE 40 MG PO TABS: ORAL_TABLET | ORAL | Status: DC

## 2011-07-01 MED ORDER — NITROGLYCERIN 0.4 MG SL SUBL: 0.4000 mg | SUBLINGUAL_TABLET | SUBLINGUAL | Status: DC | PRN

## 2011-07-01 MED ORDER — POTASSIUM CHLORIDE CRYS ER 20 MEQ PO TBCR: 20.0000 meq | EXTENDED_RELEASE_TABLET | Freq: Two times a day (BID) | ORAL | Status: DC

## 2011-07-01 MED ORDER — SOTALOL HCL 120 MG PO TABS: 120.0000 mg | ORAL_TABLET | Freq: Two times a day (BID) | ORAL | Status: DC

## 2011-07-01 NOTE — Telephone Encounter (Signed)
New Problem   Patient requesting return to discuss medication changes per insurance. Plz return call to patient at hm#

## 2011-07-01 NOTE — Telephone Encounter (Signed)
Pt calls to pick up copy of prescriptions next Thursday. They have changed mail order vendor. Pt does not need them sent in. Mylo Red RN

## 2011-07-07 ENCOUNTER — Ambulatory Visit (INDEPENDENT_AMBULATORY_CARE_PROVIDER_SITE_OTHER): Payer: Medicare Other | Admitting: *Deleted

## 2011-07-07 DIAGNOSIS — I4891 Unspecified atrial fibrillation: Secondary | ICD-10-CM

## 2011-07-07 DIAGNOSIS — Z7901 Long term (current) use of anticoagulants: Secondary | ICD-10-CM

## 2011-07-07 LAB — POCT INR: INR: 2.6

## 2011-07-11 ENCOUNTER — Telehealth: Payer: Self-pay | Admitting: Cardiology

## 2011-07-11 MED ORDER — WARFARIN SODIUM 5 MG PO TABS: ORAL_TABLET | ORAL | Status: DC

## 2011-07-11 NOTE — Telephone Encounter (Signed)
Pt aware Rx sent.  

## 2011-07-11 NOTE — Telephone Encounter (Signed)
New Problem:     Patient called in needing a refill of her warfarin (COUMADIN) 5 MG tablet with [facility]

## 2011-08-08 ENCOUNTER — Encounter: Payer: Self-pay | Admitting: Cardiology

## 2011-08-17 ENCOUNTER — Ambulatory Visit (INDEPENDENT_AMBULATORY_CARE_PROVIDER_SITE_OTHER): Payer: Medicare Other | Admitting: Cardiology

## 2011-08-17 ENCOUNTER — Encounter: Payer: Self-pay | Admitting: Cardiology

## 2011-08-17 ENCOUNTER — Ambulatory Visit: Payer: Medicare Other | Admitting: Pharmacist

## 2011-08-17 VITALS — BP 118/74 | HR 64 | Ht 60.0 in | Wt 247.0 lb

## 2011-08-17 DIAGNOSIS — Z7901 Long term (current) use of anticoagulants: Secondary | ICD-10-CM

## 2011-08-17 DIAGNOSIS — R0609 Other forms of dyspnea: Secondary | ICD-10-CM

## 2011-08-17 DIAGNOSIS — I10 Essential (primary) hypertension: Secondary | ICD-10-CM

## 2011-08-17 DIAGNOSIS — R609 Edema, unspecified: Secondary | ICD-10-CM

## 2011-08-17 DIAGNOSIS — I4891 Unspecified atrial fibrillation: Secondary | ICD-10-CM

## 2011-08-17 DIAGNOSIS — R0989 Other specified symptoms and signs involving the circulatory and respiratory systems: Secondary | ICD-10-CM

## 2011-08-17 DIAGNOSIS — I2510 Atherosclerotic heart disease of native coronary artery without angina pectoris: Secondary | ICD-10-CM

## 2011-08-17 LAB — POCT INR: INR: 2.7

## 2011-08-17 NOTE — Assessment & Plan Note (Signed)
Asymptomatic but limited mobility. It is unfortunate that she cannot tolerate a statin. Could try pravastatin in the future with an LDl goal of less than 100. Will leave to primary care.

## 2011-08-17 NOTE — Patient Instructions (Signed)
Your physician recommends that you continue on your current medications as directed. Please refer to the Current Medication list given to you today.  Your physician wants you to follow-up in: 6 months with Dr. Wall. You will receive a reminder letter in the mail two months in advance. If you don't receive a letter, please call our office to schedule the follow-up appointment.  

## 2011-08-17 NOTE — Assessment & Plan Note (Signed)
Well controlled 

## 2011-08-17 NOTE — Assessment & Plan Note (Signed)
No pitting edema today. Chronic subcutaneous adipose tissue.

## 2011-08-17 NOTE — Progress Notes (Signed)
HPI [name] comes in today for her history of atrial fib on anticoagulation and antiarrhythmic therapy.  He did not have any symptoms of recurrent A. Fib. She denies any bleeding. He is very limited in her ambulation and energy level for dyspnea on exertion which is chronic. Is mostly multifactorial but mostly related to her morbid obesity.  She's having no angina or ischemic symptoms. Recent LDL was 121 but she's not as bad anymore because of muscle weakness. This was stopped by Dr.Pang.  Past Medical History  Diagnosis Date  . Atrial fibrillation 09/20/2008  . HYPERTENSION, UNSPECIFIED 09/20/2008  . DYSPNEA ON EXERTION 09/20/2008  . LEG EDEMA, BILATERAL 09/20/2008  . Drug therapy   . Internal hemorrhoids   . GERD (gastroesophageal reflux disease)   . Hiatal hernia   . Dermatomyositis   . Morbid obesity   . Osteoarthritis   . Dyslipidemia   . Hypothyroidism   . Sleep apnea   . History of pancreatitis   . Congestive heart failure   . Shingles   . Gall stones   . Gout   . Deviated septum   . Hyperlipidemia   . Obesity   . Pancreatitis   . Continuous urine leakage     Current Outpatient Prescriptions  Medication Sig Dispense Refill  . allopurinol (ZYLOPRIM) 300 MG tablet Take 300 mg by mouth daily.      Marland Kitchen amLODipine (NORVASC) 5 MG tablet 1 tablet Daily.      Marland Kitchen aspirin 81 MG tablet Take 81 mg by mouth daily.        . Calcium Carbonate (CALCARB 600) 1500 MG TABS Take 1 tablet by mouth daily.        . Cholecalciferol (VITAMIN D3) 2000 UNITS TABS Take by mouth daily.        . clotrimazole-betamethasone (LOTRISONE) cream as directed.      . digoxin (LANOXIN) 0.125 MG tablet Take 1 tablet (0.125 mg total) by mouth daily.  90 tablet  3  . furosemide (LASIX) 40 MG tablet Take one tablet in the am and one-half tablet in the pm  135 tablet  3  . hydroxychloroquine (PLAQUENIL) 200 MG tablet Take 200 mg by mouth Twice daily.      Marland Kitchen lidocaine (LIDODERM) 5 % Place 1 patch onto the skin  daily. Remove & Discard patch within 12 hours or as directed by MD       . methotrexate (RHEUMATREX) 2.5 MG tablet Take 12.5 mg by mouth Once a week.      . Multiple Vitamin (MULTIVITAMIN) tablet Take 1 tablet by mouth daily.        . nitroGLYCERIN (NITROSTAT) 0.4 MG SL tablet Place 1 tablet (0.4 mg total) under the tongue every 5 (five) minutes as needed.  75 tablet  8  . pantoprazole (PROTONIX) 40 MG tablet Take 40 mg by mouth daily.      . polyethylene glycol powder (GLYCOLAX/MIRALAX) powder Take 17 g by mouth daily. PRN        . potassium chloride SA (K-DUR,KLOR-CON) 20 MEQ tablet Take 1 tablet (20 mEq total) by mouth 2 (two) times daily.  180 tablet  3  . predniSONE (DELTASONE) 1 MG tablet Take 7 mg by mouth daily.       . Probiotic Product (PROBIOTIC PO) Take 1 capsule by mouth daily.        . sotalol (BETAPACE) 120 MG tablet Take 1 tablet (120 mg total) by mouth 2 (two) times daily.  180  tablet  3  . SYNTHROID 100 MCG tablet Take 112 mcg by mouth daily.       Marland Kitchen warfarin (COUMADIN) 5 MG tablet Or as directed  90 tablet  1    Allergies  Allergen Reactions  . Butoconazole     Family History  Problem Relation Age of Onset  . Heart attack Brother     x 2  . Coronary artery disease Father   . Colon cancer Paternal Uncle   . Kidney disease Brother     History   Social History  . Marital Status: Married    Spouse Name: N/A    Number of Children: 2  . Years of Education: N/A   Occupational History  .     Social History Main Topics  . Smoking status: Never Smoker   . Smokeless tobacco: Never Used  . Alcohol Use: No  . Drug Use: No  . Sexually Active: Not on file   Other Topics Concern  . Not on file   Social History Narrative  . No narrative on file    ROS ALL NEGATIVE EXCEPT THOSE NOTED IN HPI  PE  General Appearance: well developed, well nourished in no acute distress, Morbidly obese HEENT: symmetrical face, PERRLA, good dentition  Neck: no JVD, thyromegaly,  or adenopathy, trachea midline Chest: symmetric without deformity Cardiac: PMI Difficult to appreciate RRR, normal S1, S2, no gallop or murmur Lung: clear to ausculation and percussion Vascular: all pulses full without bruits  Abdominal: nondistended, nontender, good bowel sounds, no HSM, no bruits Extremities: no cyanosis, clubbing, Baseline marked subcutaneous fat bilateral, no sign of DVT, no varicosities  Skin: normal color, no rashes Neuro: alert and oriented x 3, non-focal Pysch: normal affect  EKG Normal sinus rhythm, normal EKG BMET No results found for this basename: na, k, cl, co2, glucose, bun, creatinine, calcium, gfrnonaa, gfraa    Lipid Panel  No results found for this basename: chol, trig, hdl, cholhdl, vldl, ldlcalc    CBC No results found for this basename: wbc, rbc, hgb, hct, plt, mcv, mch, mchc, rdw, neutrabs, lymphsabs, monoabs, eosabs, basosabs

## 2011-08-17 NOTE — Assessment & Plan Note (Signed)
Stable. Continue current medications. Follow up with me with EKG in 6 months.

## 2011-08-17 NOTE — Assessment & Plan Note (Signed)
Multifactorial, mostly secondary to morbid obesity.

## 2011-08-18 ENCOUNTER — Encounter: Payer: Medicare Other | Admitting: *Deleted

## 2011-08-19 ENCOUNTER — Ambulatory Visit: Payer: Self-pay | Admitting: Cardiology

## 2011-08-19 DIAGNOSIS — I4891 Unspecified atrial fibrillation: Secondary | ICD-10-CM

## 2012-03-01 ENCOUNTER — Ambulatory Visit (INDEPENDENT_AMBULATORY_CARE_PROVIDER_SITE_OTHER): Payer: 59 | Admitting: Cardiology

## 2012-03-01 ENCOUNTER — Encounter: Payer: Self-pay | Admitting: Cardiology

## 2012-03-01 VITALS — BP 114/82 | HR 56 | Ht 60.0 in | Wt 252.0 lb

## 2012-03-01 DIAGNOSIS — I10 Essential (primary) hypertension: Secondary | ICD-10-CM

## 2012-03-01 DIAGNOSIS — R0609 Other forms of dyspnea: Secondary | ICD-10-CM

## 2012-03-01 DIAGNOSIS — I2510 Atherosclerotic heart disease of native coronary artery without angina pectoris: Secondary | ICD-10-CM

## 2012-03-01 DIAGNOSIS — R0989 Other specified symptoms and signs involving the circulatory and respiratory systems: Secondary | ICD-10-CM

## 2012-03-01 DIAGNOSIS — R609 Edema, unspecified: Secondary | ICD-10-CM

## 2012-03-01 DIAGNOSIS — I4891 Unspecified atrial fibrillation: Secondary | ICD-10-CM

## 2012-03-01 NOTE — Patient Instructions (Addendum)
Your physician recommends that you have lab work today  DIGOXIN level.  We will call you with your results  Your physician wants you to follow-up in: 6 months with Dr. Daleen Squibb. You will receive a reminder letter in the mail two months in advance. If you don't receive a letter, please call our office to schedule the follow-up appointment.  Your physician recommends that you continue on your current medications as directed. Please refer to the Current Medication list given to you today.

## 2012-03-01 NOTE — Progress Notes (Signed)
HPI [name] returns today for evaluation and management of her paroxysmal atrial fibrillation on flecainide and anticoagulation as well as her history of coronary disease.  Other than dyspnea on exertion which I explained to her in the past is multifactorial, she has no new complaints. She denies any angina. Has intermittent spontaneous palpitations which last briefly. No sustained episodes. No presyncope or syncope. She denies any bleeding or melena.  Past Medical History  Diagnosis Date  . Atrial fibrillation 09/20/2008  . HYPERTENSION, UNSPECIFIED 09/20/2008  . DYSPNEA ON EXERTION 09/20/2008  . LEG EDEMA, BILATERAL 09/20/2008  . Drug therapy   . Internal hemorrhoids   . GERD (gastroesophageal reflux disease)   . Hiatal hernia   . Dermatomyositis   . Morbid obesity   . Osteoarthritis   . Dyslipidemia   . Hypothyroidism   . Sleep apnea   . History of pancreatitis   . Congestive heart failure   . Shingles   . Gall stones   . Gout   . Deviated septum   . Hyperlipidemia   . Obesity   . Pancreatitis   . Continuous urine leakage     Current Outpatient Prescriptions  Medication Sig Dispense Refill  . allopurinol (ZYLOPRIM) 300 MG tablet Take 300 mg by mouth daily.      Marland Kitchen aspirin 81 MG tablet Take 81 mg by mouth daily.        . Calcium Carbonate (CALCARB 600) 1500 MG TABS Take 1 tablet by mouth daily.        . Cholecalciferol (VITAMIN D3) 2000 UNITS TABS Take by mouth daily.        . clotrimazole-betamethasone (LOTRISONE) cream as directed.      . digoxin (LANOXIN) 0.125 MG tablet Take 1 tablet (0.125 mg total) by mouth daily.  90 tablet  3  . furosemide (LASIX) 40 MG tablet Take one tablet in the am and one-half tablet in the pm  135 tablet  3  . hydroxychloroquine (PLAQUENIL) 200 MG tablet Take 200 mg by mouth Twice daily.      Marland Kitchen levothyroxine (SYNTHROID, LEVOTHROID) 88 MCG tablet Take 1 tablet by mouth daily.      Marland Kitchen lidocaine (LIDODERM) 5 % Place 1 patch onto the skin daily.  Remove & Discard patch within 12 hours or as directed by MD       . methotrexate (RHEUMATREX) 2.5 MG tablet Take 12.5 mg by mouth Once a week.      . Multiple Vitamin (MULTIVITAMIN) tablet Take 1 tablet by mouth daily.        . nitroGLYCERIN (NITROSTAT) 0.4 MG SL tablet Place 1 tablet (0.4 mg total) under the tongue every 5 (five) minutes as needed.  75 tablet  8  . pantoprazole (PROTONIX) 40 MG tablet Take 40 mg by mouth daily.      . polyethylene glycol powder (GLYCOLAX/MIRALAX) powder Take 17 g by mouth daily. PRN        . potassium chloride SA (K-DUR,KLOR-CON) 20 MEQ tablet Take 1 tablet (20 mEq total) by mouth 2 (two) times daily.  180 tablet  3  . predniSONE (DELTASONE) 1 MG tablet Take 7 mg by mouth daily.       . Probiotic Product (PROBIOTIC PO) Take 1 capsule by mouth daily.        . sotalol (BETAPACE) 120 MG tablet Take 1 tablet (120 mg total) by mouth 2 (two) times daily.  180 tablet  3  . warfarin (COUMADIN) 5 MG tablet Or  as directed  90 tablet  1    Allergies  Allergen Reactions  . Butoconazole   . Lipitor (Atorvastatin Calcium)     Family History  Problem Relation Age of Onset  . Heart attack Brother     x 2  . Coronary artery disease Father   . Colon cancer Paternal Uncle   . Kidney disease Brother     History   Social History  . Marital Status: Married    Spouse Name: N/A    Number of Children: 2  . Years of Education: N/A   Occupational History  .     Social History Main Topics  . Smoking status: Never Smoker   . Smokeless tobacco: Never Used  . Alcohol Use: No  . Drug Use: No  . Sexually Active: Not on file   Other Topics Concern  . Not on file   Social History Narrative  . No narrative on file    ROS ALL NEGATIVE EXCEPT THOSE NOTED IN HPI  PE  General Appearance: well developed, well nourished in no acute distress, morbid obesity. HEENT: symmetrical face, PERRLA, good dentition  Neck: no JVD, thyromegaly, or adenopathy, trachea  midline Chest: symmetric without deformity Cardiac: PMI cannot be detected, soft  S1, S2, no gallop or murmur Lung: clear to ausculation and percussion Vascular: all pulses full without bruits  Abdominal: nondistended, nontender, good bowel sounds, no HSM, no bruits Extremities: no cyanosis, clubbing , no sign of DVT, no varicosities , 1+ pitting but mostly doughy Skin: normal color, no rashes Neuro: alert and oriented x 3, non-focal Pysch: normal affect  EKG  BMET No results found for this basename: na, k, cl, co2, glucose, bun, creatinine, calcium, gfrnonaa, gfraa    Lipid Panel  No results found for this basename: chol, trig, hdl, cholhdl, vldl, ldlcalc    CBC No results found for this basename: wbc, rbc, hgb, hct, plt, mcv, mch, mchc, rdw, neutrabs, lymphsabs, monoabs, eosabs, basosabs

## 2012-03-01 NOTE — Assessment & Plan Note (Signed)
Some pitting but mostly adipose tissue. Stable. No change in medical therapy.

## 2012-03-01 NOTE — Assessment & Plan Note (Signed)
Relatively good control. No change in medication.

## 2012-03-01 NOTE — Assessment & Plan Note (Signed)
Clinically stable. Continue secondary preventative therapy.

## 2012-03-01 NOTE — Assessment & Plan Note (Signed)
Multifactorial. Explained to patient and husband.

## 2012-03-01 NOTE — Assessment & Plan Note (Addendum)
Maintaining sinus bradycardia. Intermittent palpitations of short duration unlikely A. fib. Continue flecainide and anticoagulation. Check digoxin level.

## 2012-03-02 LAB — DIGOXIN LEVEL: Digoxin Level: 0.9 ng/mL (ref 0.8–2.0)

## 2012-03-07 ENCOUNTER — Telehealth: Payer: Self-pay | Admitting: Cardiology

## 2012-03-07 NOTE — Telephone Encounter (Signed)
Patient called wanting to know lab results was told Dr.Wall has not reviewed yet.Message sent to Dr.Wall's nurse.

## 2012-03-07 NOTE — Telephone Encounter (Signed)
plz return call to patient at hm# regarding lab results.

## 2012-03-08 NOTE — Telephone Encounter (Signed)
Pt aware of lab results. She is unhappy with having her blood drawn in our office because "I had to be stuck 4 different times and he wiggled the needle around. I remember him from the hospital and I want someone else to draw my blood from now own".  Reassurance given to pt. Mylo Red RN

## 2012-04-04 NOTE — Addendum Note (Signed)
Addended by: Marrion Coy L on: 04/04/2012 03:33 PM   Modules accepted: Orders

## 2012-04-26 ENCOUNTER — Telehealth: Payer: Self-pay | Admitting: Cardiology

## 2012-04-26 NOTE — Telephone Encounter (Signed)
This patients coumadin is managed by Dr Suzie Portela her primary care physician, instructed her to call her primary care for refill of coumadin

## 2012-04-26 NOTE — Telephone Encounter (Signed)
Warfarin refill needed at [facility]

## 2012-07-25 ENCOUNTER — Other Ambulatory Visit: Payer: Self-pay | Admitting: *Deleted

## 2012-07-25 DIAGNOSIS — I4891 Unspecified atrial fibrillation: Secondary | ICD-10-CM

## 2012-07-25 MED ORDER — SOTALOL HCL 120 MG PO TABS: 120.0000 mg | ORAL_TABLET | Freq: Two times a day (BID) | ORAL | Status: DC

## 2012-07-27 ENCOUNTER — Other Ambulatory Visit: Payer: Self-pay | Admitting: Cardiology

## 2012-08-22 ENCOUNTER — Encounter: Payer: Self-pay | Admitting: Cardiology

## 2012-08-29 ENCOUNTER — Encounter: Payer: Self-pay | Admitting: Cardiology

## 2012-08-30 ENCOUNTER — Ambulatory Visit (INDEPENDENT_AMBULATORY_CARE_PROVIDER_SITE_OTHER): Payer: Medicare Other | Admitting: Cardiology

## 2012-08-30 ENCOUNTER — Encounter: Payer: Self-pay | Admitting: Cardiology

## 2012-08-30 VITALS — BP 110/72 | HR 60 | Ht 60.0 in | Wt 249.0 lb

## 2012-08-30 DIAGNOSIS — I10 Essential (primary) hypertension: Secondary | ICD-10-CM

## 2012-08-30 DIAGNOSIS — R0989 Other specified symptoms and signs involving the circulatory and respiratory systems: Secondary | ICD-10-CM

## 2012-08-30 DIAGNOSIS — R0609 Other forms of dyspnea: Secondary | ICD-10-CM

## 2012-08-30 DIAGNOSIS — I2510 Atherosclerotic heart disease of native coronary artery without angina pectoris: Secondary | ICD-10-CM

## 2012-08-30 DIAGNOSIS — R609 Edema, unspecified: Secondary | ICD-10-CM

## 2012-08-30 DIAGNOSIS — I4891 Unspecified atrial fibrillation: Secondary | ICD-10-CM

## 2012-08-30 MED ORDER — AMIODARONE HCL 400 MG PO TABS: 400.0000 mg | ORAL_TABLET | Freq: Every day | ORAL | Status: DC

## 2012-08-30 MED ORDER — POTASSIUM CHLORIDE CRYS ER 20 MEQ PO TBCR: 20.0000 meq | EXTENDED_RELEASE_TABLET | Freq: Two times a day (BID) | ORAL | Status: DC

## 2012-08-30 MED ORDER — AMIODARONE HCL 400 MG PO TABS: ORAL_TABLET | ORAL | Status: DC

## 2012-08-30 NOTE — Progress Notes (Signed)
HPI [name] comes in today with recurrent A. fib. She felt palpitations, weakness and dizziness. She saw primary care with Dr.Pang yesterday. He gave her an extra dose of digoxin and double her daily dose. EKG from his office shows A. fib with a well-controlled ventricular rate. She's been well controlled for years on sotalol.  This morning she felt like she was back in normal rhythm. Her exam shows a regular rhythm today. EKG not repeated.  Recent blood work was unremarkable including LFTs.  Past Medical History  Diagnosis Date  . Atrial fibrillation 09/20/2008  . HYPERTENSION, UNSPECIFIED 09/20/2008  . DYSPNEA ON EXERTION 09/20/2008  . LEG EDEMA, BILATERAL 09/20/2008  . Drug therapy   . Internal hemorrhoids   . GERD (gastroesophageal reflux disease)   . Hiatal hernia   . Dermatomyositis   . Morbid obesity   . Osteoarthritis   . Dyslipidemia   . Hypothyroidism   . Sleep apnea   . History of pancreatitis   . Congestive heart failure   . Shingles   . Gall stones   . Gout   . Deviated septum   . Hyperlipidemia   . Obesity   . Pancreatitis   . Continuous urine leakage     Current Outpatient Prescriptions  Medication Sig Dispense Refill  . alendronate (FOSAMAX) 70 MG tablet Take 1 tablet by mouth once a week.      Marland Kitchen allopurinol (ZYLOPRIM) 300 MG tablet Take 300 mg by mouth daily.      Marland Kitchen aspirin 81 MG tablet Take 81 mg by mouth daily.        . Cholecalciferol (VITAMIN D3) 2000 UNITS TABS Take by mouth daily.        . clonazePAM (KLONOPIN) 0.5 MG tablet Take 1 tablet by mouth daily.      . clotrimazole-betamethasone (LOTRISONE) cream as directed.      . digoxin (DIGOX) 0.125 MG tablet Take 1 tablet (0.125 mg total) by mouth daily.  90 tablet  3  . furosemide (LASIX) 40 MG tablet Take one tablet in the am and one-half tablet in the pm  135 tablet  3  . hydrocortisone (ANUSOL-HC) 2.5 % rectal cream Place rectally 2 (two) times daily.      . hydroxychloroquine (PLAQUENIL) 200 MG  tablet Take 200 mg by mouth Twice daily.      Marland Kitchen levothyroxine (SYNTHROID, LEVOTHROID) 100 MCG tablet Take 1 tablet by mouth daily.      Marland Kitchen lidocaine (LIDODERM) 5 % Place 1 patch onto the skin daily. Remove & Discard patch within 12 hours or as directed by MD       . methotrexate (RHEUMATREX) 2.5 MG tablet Take 12.5 mg by mouth Once a week.      . Multiple Vitamin (MULTIVITAMIN) tablet Take 1 tablet by mouth daily.        . nitroGLYCERIN (NITROSTAT) 0.4 MG SL tablet Place 1 tablet (0.4 mg total) under the tongue every 5 (five) minutes as needed.  75 tablet  8  . pantoprazole (PROTONIX) 40 MG tablet Take 40 mg by mouth daily.      . potassium chloride SA (K-DUR,KLOR-CON) 20 MEQ tablet Take 1 tablet (20 mEq total) by mouth 2 (two) times daily.  180 tablet  3  . predniSONE (DELTASONE) 1 MG tablet Take 7 mg by mouth daily.       . sotalol (BETAPACE) 120 MG tablet Take 1 tablet (120 mg total) by mouth 2 (two) times daily.  180 tablet  1  . traMADol (ULTRAM) 50 MG tablet as needed.      . warfarin (COUMADIN) 5 MG tablet Or as directed  90 tablet  1   No current facility-administered medications for this visit.    Allergies  Allergen Reactions  . Butoconazole   . Lipitor (Atorvastatin Calcium)     Family History  Problem Relation Age of Onset  . Heart attack Brother     x 2  . Coronary artery disease Father   . [name] cancer Paternal Uncle   . Kidney disease Brother     History   Social History  . Marital Status: Married    Spouse Name: N/A    Number of Children: 2  . Years of Education: N/A   Occupational History  .     Social History Main Topics  . Smoking status: Never Smoker   . Smokeless tobacco: Never Used  . Alcohol Use: No  . Drug Use: No  . Sexually Active: Not on file   Other Topics Concern  . Not on file   Social History Narrative  . No narrative on file    ROS ALL NEGATIVE EXCEPT THOSE NOTED IN HPI  PE  General Appearance: well developed, well nourished  in no acute distress, morbidly obese HEENT: symmetrical face, PERRLA, good dentition  Neck: no JVD, thyromegaly, or adenopathy, trachea midline Chest: symmetric without deformity Cardiac: PMI non-displaced, RRR, normal S1, S2, no gallop or murmur Lung: clear to ausculation and percussion Vascular: all pulses full without bruits  Abdominal: nondistended, nontender, good bowel sounds, no HSM, no bruits Extremities: no cyanosis, clubbing , fatty tissue at the ankles, minimal edema, no sign of DVT, no varicosities  Skin: normal color, no rashes Neuro: alert and oriented x 3, non-focal Pysch: normal affect  EKG Not repeated today BMET No results found for this basename: na, k, cl, co2, glucose, bun, creatinine, calcium, gfrnonaa, gfraa    Lipid Panel  No results found for this basename: chol, trig, hdl, cholhdl, vldl, ldlcalc    CBC No results found for this basename: wbc, rbc, hgb, hct, plt, mcv, mch, mchc, rdw, neutrabs, lymphsabs, monoabs, eosabs, basosabs

## 2012-08-30 NOTE — Addendum Note (Signed)
Addended by: Lisabeth Devoid F on: 08/30/2012 09:52 AM   Modules accepted: Orders

## 2012-08-30 NOTE — Patient Instructions (Addendum)
Stop taking Sotalol (Betapace) today  Start on Saturday (08/31/12)    Amiodarone 400mg  1 tablet twice a day for 2 weeks.  Then decrease to 1 tablet daily  Continue taking your Digoxin 0.125mg  daily  Follow-up with Dr. Vern Claude PA  Lorin Picket in 4 weeks.

## 2012-08-30 NOTE — Assessment & Plan Note (Signed)
She has recurrent A. fib on sotalol. We'll discontinue this and start amiodarone 400 mg twice a day for 2 weeks then 400 mg a day. We'll keep her on low-dose digoxin 0.125 mg. We'll check blood work in 6 weeks. Indications, potential risk and benefits discussed with the patient and her husband. Blood work will include TSH, free T4, LFTs, and a digoxin level.

## 2012-09-07 ENCOUNTER — Encounter: Payer: Self-pay | Admitting: Cardiology

## 2012-09-10 ENCOUNTER — Telehealth: Payer: Self-pay | Admitting: Cardiology

## 2012-09-10 NOTE — Telephone Encounter (Signed)
Spoke with Dr.Wall he advised to start taking amiodarone 400 mg daily.Advised to keep appointment with Tereso Newcomer PA 10/02/12 and call sooner if needed.

## 2012-09-10 NOTE — Telephone Encounter (Signed)
Returned call to patient she stated she was started on amiodarone 400 mg twice a day,Saturday 09/15/12 then she is to decrease to 400 mg daily.States this morning she feels awlful shaky, when stands up she is very dizzy feels faint.States her heart is in rhythm.Patient was told will check with Dr.Wall and call her back.

## 2012-09-10 NOTE — Telephone Encounter (Signed)
New problem     C/O side effect from new medication. Shakes.

## 2012-09-24 ENCOUNTER — Telehealth: Payer: Self-pay | Admitting: Cardiology

## 2012-09-24 NOTE — Telephone Encounter (Signed)
New Prob    Pt was prescribed AMIODARONE and was told it interferes with WARFARIN. [facility] will not fill medication due to interaction until they speak to provider. Would like to speak to nurse regarding this.

## 2012-09-24 NOTE — Telephone Encounter (Signed)
I spoke with the patient. She states [facility] will not fill her amiodarone due to the interaction with amiodarone and warfarin. I verified with the patient that she is having her warfarin followed by Dr. Rhunette Croft office (PCP). She reports her INR week before last was 4.4. She was redosed and rechecked last week and her INR was 2.5. She is due for a recheck this week on her INR. We have discussed the interaction between amiodarone and warfarin. The patient verbalizes understanding. She also states that Dr. Suzie Portela would like her switched to something more compatible with warfarin. The patient has failed sotalol already. She is to follow up with Tereso Newcomer, PA on 4/22. I called and spoke with the pharmacist at [facility] and made her aware the doctor is aware of the interaction with amiodarone and warfarin and that her INR's are being followed closely. [facility] will go ahead and release the patient's prescription to her. The patient is aware.

## 2012-10-01 ENCOUNTER — Encounter: Payer: Self-pay | Admitting: *Deleted

## 2012-10-02 ENCOUNTER — Ambulatory Visit (INDEPENDENT_AMBULATORY_CARE_PROVIDER_SITE_OTHER): Payer: Medicare Other | Admitting: Physician Assistant

## 2012-10-02 ENCOUNTER — Encounter: Payer: Self-pay | Admitting: Physician Assistant

## 2012-10-02 VITALS — BP 135/63 | HR 65 | Ht 60.0 in | Wt 259.8 lb

## 2012-10-02 DIAGNOSIS — E785 Hyperlipidemia, unspecified: Secondary | ICD-10-CM

## 2012-10-02 DIAGNOSIS — I4891 Unspecified atrial fibrillation: Secondary | ICD-10-CM

## 2012-10-02 DIAGNOSIS — R079 Chest pain, unspecified: Secondary | ICD-10-CM

## 2012-10-02 DIAGNOSIS — Z9229 Personal history of other drug therapy: Secondary | ICD-10-CM

## 2012-10-02 DIAGNOSIS — I5032 Chronic diastolic (congestive) heart failure: Secondary | ICD-10-CM

## 2012-10-02 DIAGNOSIS — I2510 Atherosclerotic heart disease of native coronary artery without angina pectoris: Secondary | ICD-10-CM

## 2012-10-02 MED ORDER — AMIODARONE HCL 200 MG PO TABS: ORAL_TABLET | ORAL | Status: DC

## 2012-10-02 NOTE — Patient Instructions (Addendum)
Your physician has recommended that you have a pulmonary function test Dx AMIODARONE THERAPY; CAN HAVE DONE AT [facility]. Pulmonary Function Tests are a group of tests that measure how well air moves in and out of your lungs.  STOP ASPIRIN  MAKE SURE TO HAVE AN EYE EXAM PER SC[facility] WEAVER, PAC  PLEASE FOLLOW UP WITH SCOTT WEAVER, PAC IN 3 MONTHS  PLEASE FOLLOW UP WITH DR. Patty Sermons IN 6 MONTHS  Your physician has requested that you have a lexiscan myoview. For further information please visit [email or web address] Please follow instruction sheet, as given.  YOU HAVE BEEN GIVEN A PRESCRIPTION TO TAKE TO YOUR PCP TOMORROW FOR LAB WORK WITH RESULTS TO BE FAXED TO [facility] WEAVER, PAC [phone]; WITH THIS LAB WORK TOMORROW YOU WILL NOT TAKE YOUR DIGOXIN UNTIL LAB WORK IS DONE

## 2012-10-02 NOTE — Progress Notes (Addendum)
[address], [address] [place], [place]  [address] Phone: [phone] Fax:  [phone]  Date:  10/02/2012   ID:  [name], DOB 1945-08-06, MRN [id number]  PCP:  Juline Patch, MD  Rheumatologist:  Dr. Dierdre Forth Primary Cardiologist:  Dr. Valera Castle     History of Present Illness: [name] is a 67 y.o. female who returns for f/u on AFib.    She has a hx of atrial fibrillation, CAD, HTN, steroid-induced diabetes, dermatomyositis, HL, hypothyroidism, sleep apnea, obesity. LHC 03/2006: EF 65%, mid LAD 40-50%, ostial D1 70-80%, normal circumflex, normal RCA. There was no change compared to 2005.  Medical therapy continued. Lexiscan Myoview 09/2010: No ischemia or scar, EF 75%. Patient recently seen by Dr. Daleen Squibb with recurrent atrial fibrillation. She had been controlled on sotalol for many years. Sotalol was stopped and she was placed on amiodarone.  Since last seen, she has seen her PCP. She noted some dizziness and her Lasix was stopped. Patient noted worsening LE edema and increased cough with lying down.  She restarted the Lasix with improvement in her symptoms.  She notes chest tightness with exertion.  This is a chronic symptom.  However, she believes it is getting worse.  No syncope.  She denies further rapid palpitations.  She has had some brief palpitations.  She notes DOE that is chronic (NYHA Class IIb-III) without change.  She does note some hand tremors since starting amiodarone.  This is better on the lower dose.     Wt Readings from Last 3 Encounters:  10/02/12 259 lb 12.8 oz (117.845 kg)  08/30/12 249 lb (112.946 kg)  03/01/12 252 lb (114.306 kg)     Past Medical History  Diagnosis Date  . Atrial fibrillation 09/20/2008  . HYPERTENSION, UNSPECIFIED 09/20/2008  . LEG EDEMA, BILATERAL 09/20/2008  . Internal hemorrhoids   . GERD (gastroesophageal reflux disease)   . Hiatal hernia   . Dermatomyositis   . Morbid obesity   . Osteoarthritis   . Hypothyroidism     . Sleep apnea   . History of pancreatitis   . Chronic diastolic heart failure   . Shingles   . Gall stones   . Gout   . Deviated septum   . Hyperlipidemia   . Obesity   . Pancreatitis   . Continuous urine leakage   . CAD (coronary artery disease)     a. LHC 03/2006: EF 65%, mid LAD 40-50%, ostial D1 70-80%, normal circumflex, normal RCA;  b. Lexiscan Myoview 09/2010: No ischemia or scar, EF 75%     Current Outpatient Prescriptions  Medication Sig Dispense Refill  . alendronate (FOSAMAX) 70 MG tablet Take 1 tablet by mouth once a week.      Marland Kitchen allopurinol (ZYLOPRIM) 300 MG tablet Take 300 mg by mouth daily.      Marland Kitchen amiodarone (PACERONE) 400 MG tablet Starting 08/31/12 1 tablet twice a day for 2 weeks. Then 1 tablet daily  45 tablet  2  . aspirin 81 MG tablet Take 81 mg by mouth daily.        . Cholecalciferol (VITAMIN D3) 2000 UNITS TABS Take by mouth daily.        . clonazePAM (KLONOPIN) 0.5 MG tablet Take 1 tablet by mouth daily.      . clotrimazole-betamethasone (LOTRISONE) cream as directed.      . digoxin (DIGOX) 0.125 MG tablet Take 1 tablet (0.125 mg total) by mouth daily.  90 tablet  3  .  furosemide (LASIX) 40 MG tablet Take one tablet in the am and one-half tablet in the pm  135 tablet  3  . hydrocortisone (ANUSOL-HC) 2.5 % rectal cream Place rectally 2 (two) times daily.      . hydroxychloroquine (PLAQUENIL) 200 MG tablet Take 200 mg by mouth Twice daily.      Marland Kitchen levothyroxine (SYNTHROID, LEVOTHROID) 100 MCG tablet Take 1 tablet by mouth daily.      Marland Kitchen lidocaine (LIDODERM) 5 % Place 1 patch onto the skin daily. Remove & Discard patch within 12 hours or as directed by MD       . methotrexate (RHEUMATREX) 2.5 MG tablet Take 12.5 mg by mouth Once a week.      . Multiple Vitamin (MULTIVITAMIN) tablet Take 1 tablet by mouth daily.        . nitroGLYCERIN (NITROSTAT) 0.4 MG SL tablet Place 1 tablet (0.4 mg total) under the tongue every 5 (five) minutes as needed.  75 tablet  8  .  pantoprazole (PROTONIX) 40 MG tablet Take 40 mg by mouth daily.      . potassium chloride SA (K-DUR,KLOR-CON) 20 MEQ tablet Take 1 tablet (20 mEq total) by mouth 2 (two) times daily.  180 tablet  3  . predniSONE (DELTASONE) 1 MG tablet Take 7 mg by mouth daily.       . traMADol (ULTRAM) 50 MG tablet as needed.      . warfarin (COUMADIN) 5 MG tablet Or as directed  90 tablet  1   No current facility-administered medications for this visit.    Allergies:    Allergies  Allergen Reactions  . Butoconazole   . Lipitor (Atorvastatin Calcium)     Social History:  The patient  reports that she has never smoked. She has never used smokeless tobacco. She reports that she does not drink alcohol or use illicit drugs.   ROS:  Please see the history of present illness.    All other systems reviewed and negative.   PHYSICAL EXAM: VS:  BP 135/63  Pulse 65  Ht 5' (1.524 m)  Wt 259 lb 12.8 oz (117.845 kg)  BMI 50.74 kg/m2 Well nourished, well developed, in no acute distress HEENT: normal Neck: no JVD at 90 Cardiac:  normal S1, S2; RRR; no murmur Lungs:  clear to auscultation bilaterally, no wheezing, rhonchi or rales Abd: soft, nontender Ext: trace pitting edema Skin: warm and dry Neuro:  CNs 2-12 intact, no focal abnormalities noted  EKG:  NSR, HR 65, normal axis, nonspecific ST-T wave changes, QTc 443     ASSESSMENT AND PLAN:  1. Atrial Fibrillation:  She is maintaining NSR on amiodarone. She has had some side effects to this medication. She has been on 400 mg daily long enough that we can cut her dose back down to 200 mg daily. She is due for lab work. She has labs pending with her PCP tomorrow. We will request a basic metabolic panel, digoxin level, hepatic function panel, TSH, free T4. The patient will also be set up for PFTs with DLCO. I have also asked her to get annual eye exams. Coumadin is managed by her primary care physician. If she has recurrent problems with atrial fibrillation in  the future or intolerable side effects to amiodarone, we may need to consider Tikosyn. At that point, we may need to get her to see one of our electrophysiologists. 2. Chest Pain:  She has chronic CP.  This seems to be worse.  It has bee 2 years since her last stress test.  I will arrange a Lexiscan Myoview.  If normal, no further cardiac workup. 3. CAD:  Proceed with myoview as noted.  She is on coumadin.  She does not need to be on ASA as well.  This will be d/c'd.  4. Chronic Diastolic CHF:  Volume was up with no Lasix.  She is doing better now and appears stable.  Continue current Rx.  Check BMET. 5. Hyperlipidemia:  Not sure why she is not on a statin.  She may be intolerant.  We can revisit this at f/u. 6. Hypothyroidism:  Managed by PCP. 7. Disposition:  She can f/u with me in 3 mos or sooner if myoview abnormal.  She can f/u with Dr. Delton See in the future.   Luna Glasgow, PA-C  10:49 AM 10/02/2012

## 2012-10-15 ENCOUNTER — Ambulatory Visit (HOSPITAL_COMMUNITY): Payer: Medicare Other | Attending: Cardiology | Admitting: Radiology

## 2012-10-15 VITALS — BP 146/67 | Ht 60.0 in | Wt 249.0 lb

## 2012-10-15 DIAGNOSIS — I4891 Unspecified atrial fibrillation: Secondary | ICD-10-CM | POA: Insufficient documentation

## 2012-10-15 DIAGNOSIS — R0789 Other chest pain: Secondary | ICD-10-CM | POA: Insufficient documentation

## 2012-10-15 DIAGNOSIS — R0989 Other specified symptoms and signs involving the circulatory and respiratory systems: Secondary | ICD-10-CM

## 2012-10-15 DIAGNOSIS — Z8249 Family history of ischemic heart disease and other diseases of the circulatory system: Secondary | ICD-10-CM | POA: Insufficient documentation

## 2012-10-15 DIAGNOSIS — R002 Palpitations: Secondary | ICD-10-CM | POA: Insufficient documentation

## 2012-10-15 DIAGNOSIS — R079 Chest pain, unspecified: Secondary | ICD-10-CM

## 2012-10-15 DIAGNOSIS — I10 Essential (primary) hypertension: Secondary | ICD-10-CM | POA: Insufficient documentation

## 2012-10-15 DIAGNOSIS — R0609 Other forms of dyspnea: Secondary | ICD-10-CM

## 2012-10-15 DIAGNOSIS — I2510 Atherosclerotic heart disease of native coronary artery without angina pectoris: Secondary | ICD-10-CM

## 2012-10-15 DIAGNOSIS — R42 Dizziness and giddiness: Secondary | ICD-10-CM | POA: Insufficient documentation

## 2012-10-15 DIAGNOSIS — E669 Obesity, unspecified: Secondary | ICD-10-CM | POA: Insufficient documentation

## 2012-10-15 DIAGNOSIS — E785 Hyperlipidemia, unspecified: Secondary | ICD-10-CM | POA: Insufficient documentation

## 2012-10-15 MED ORDER — TECHNETIUM TC 99M SESTAMIBI GENERIC - CARDIOLITE
33.0000 | Freq: Once | INTRAVENOUS | Status: AC | PRN
  Administered 2012-10-15: 33 via INTRAVENOUS

## 2012-10-15 MED ORDER — REGADENOSON 0.4 MG/5ML IV SOLN
0.4000 mg | Freq: Once | INTRAVENOUS | Status: AC
  Administered 2012-10-15: 0.4 mg via INTRAVENOUS

## 2012-10-15 NOTE — Progress Notes (Signed)
[facility] SITE 3 NUCLEAR MED [address] [place], [place] [address] [phone]    Cardiology Nuclear Med Study  ELOWEN DEBRUYN is a 67 y.o. female     MRN : [id number]     DOB: 09-30-1945  Procedure Date: 10/15/2012  Nuclear Med Background Indication for Stress Test:  Evaluation for Ischemia History:  Afib, 2002 Cardioversion ECHO: 55-60%, 03/2006 Heart Cath: N/O Dz LAD DI RX TX EF: 65%, 09/2010 MPS: NL EF: 75%  Cardiac Risk Factors: Family History - CAD, Hypertension, Lipids and Obesity  Symptoms:  Chest Pain, Chest Tightness, Dizziness, DOE and Palpitations   Nuclear Pre-Procedure Caffeine/Decaff Intake:  None NPO After: 6:00 pm   Lungs:  clear O2 Sat: 99% on room air. IV 0.9% NS with Angio Cath:  22g  IV Site: R Forearm  IV Started by:  Doyne Keel, CNMT  Chest Size (in):  50 Cup Size: C  Height: 5' (1.524 m)  Weight:  249 lb (112.946 kg)  BMI:  Body mass index is 48.63 kg/(m^2). Tech Comments:  No Rx this am    Nuclear Med Study 1 or 2 day study: 2 day  Stress Test Type:  Eugenie Birks  Reading MD: Marca Ancona, MD  Order Authorizing Provider:  T.Wall MD  Resting Radionuclide: Technetium 100m Sestamibi  Resting Radionuclide Dose: 33.0 mCi  10/16/12  Stress Radionuclide:  Technetium 18m Sestamibi  Stress Radionuclide Dose: 33.0 mCi   10/15/12          Stress Protocol Rest HR: 63 Stress HR: 80  Rest BP: 146/67 Stress BP: 155/57  Exercise Time (min): n/a METS: n/a   Predicted Max HR: 154 bpm % Max HR: 51.95 bpm Rate Pressure Product: 21308   Dose of Adenosine (mg):  n/a Dose of Lexiscan: 0.4 mg  Dose of Atropine (mg): n/a Dose of Dobutamine: n/a mcg/kg/min (at max HR)  Stress Test Technologist: Milana Na, EMT-P  Nuclear Technologist:  Domenic Polite, CNMT     Rest Procedure:  Myocardial perfusion imaging was performed at rest 45 minutes following the intravenous administration of Technetium 50m Sestamibi. Rest ECG: NSR with non-specific ST-T wave  changes  Stress Procedure:  The patient received IV Lexiscan 0.4 mg over 15-seconds.  Technetium 45m Sestamibi injected at 30-seconds. This patient's chest felt funny with the Lexiscan injection. Quantitative spect images were obtained after a 45 minute delay. Stress ECG: No significant change from baseline ECG  QPS Raw Data Images:  Normal; no motion artifact; normal heart/lung ratio. Stress Images:  Normal homogeneous uptake in all areas of the myocardium. Rest Images:  Normal homogeneous uptake in all areas of the myocardium. Subtraction (SDS):  SDS 2 Transient Ischemic Dilatation (Normal <1.22):  0.99 Lung/Heart Ratio (Normal <0.45):  0.36  Quantitative Gated Spect Images QGS EDV:  88 ml QGS ESV:  26 ml  Impression Exercise Capacity:  Lexiscan with no exercise. BP Response:  Normal blood pressure response. Clinical Symptoms:  No significant symptoms noted. ECG Impression:  No significant ST segment change suggestive of ischemia. Comparison with Prior Nuclear Study: No images to compare  Overall Impression:  Normal stress nuclear study.  LV Ejection Fraction: 71%.  LV Wall Motion:  NL LV Function; NL Wall Motion   Charlton Haws

## 2012-10-16 ENCOUNTER — Ambulatory Visit (HOSPITAL_COMMUNITY): Payer: Medicare Other | Attending: Cardiovascular Disease

## 2012-10-16 DIAGNOSIS — R0989 Other specified symptoms and signs involving the circulatory and respiratory systems: Secondary | ICD-10-CM

## 2012-10-16 MED ORDER — TECHNETIUM TC 99M SESTAMIBI GENERIC - CARDIOLITE
33.0000 | Freq: Once | INTRAVENOUS | Status: AC | PRN
  Administered 2012-10-16: 33 via INTRAVENOUS

## 2012-10-17 ENCOUNTER — Encounter: Payer: Self-pay | Admitting: Physician Assistant

## 2012-10-17 ENCOUNTER — Telehealth: Payer: Self-pay | Admitting: *Deleted

## 2012-10-17 NOTE — Telephone Encounter (Signed)
Message copied by Tarri Fuller on Wed Oct 17, 2012  5:55 PM ------      Message from: [place], [place] T      Created: Wed Oct 17, 2012  1:29 PM       Please inform patient stress test normal.      Tereso Newcomer, PA-C  1:29 PM 10/17/2012 ------

## 2012-10-17 NOTE — Telephone Encounter (Signed)
pt notified about normal myoview with verbal understanding today

## 2012-10-26 ENCOUNTER — Ambulatory Visit (INDEPENDENT_AMBULATORY_CARE_PROVIDER_SITE_OTHER): Payer: Medicare Other | Admitting: Internal Medicine

## 2012-10-26 DIAGNOSIS — R0609 Other forms of dyspnea: Secondary | ICD-10-CM

## 2012-10-26 DIAGNOSIS — I4891 Unspecified atrial fibrillation: Secondary | ICD-10-CM

## 2012-10-26 DIAGNOSIS — R0989 Other specified symptoms and signs involving the circulatory and respiratory systems: Secondary | ICD-10-CM

## 2012-10-26 LAB — PULMONARY FUNCTION TEST

## 2012-10-26 NOTE — Progress Notes (Signed)
PFT done today. 

## 2012-10-31 ENCOUNTER — Telehealth: Payer: Self-pay | Admitting: Internal Medicine

## 2012-10-31 NOTE — Telephone Encounter (Signed)
Will locate PFT and mail copy to patient.

## 2012-11-02 ENCOUNTER — Encounter: Payer: Self-pay | Admitting: Cardiology

## 2012-11-02 NOTE — Telephone Encounter (Signed)
No sign of scanned PFT in pt's chart Dr Vern Claude office ordered  [facility] spoke with patient She stated that she sees her PCP on Tuesday May 27 and wanted him to have a copy of this Advised pt will locate copy of PFT and fax to Dr Ricki Miller for her  >> [phone] >> done Pt also wants copy mailed to her; advised will do so as well Home address verified Nothing further needed at this time; will sign off

## 2012-11-09 ENCOUNTER — Telehealth: Payer: Self-pay | Admitting: Cardiology

## 2012-11-09 NOTE — Telephone Encounter (Signed)
New Prob     Pt would like to speak to nurse regarding coming off of AMIODARONE. Please call.

## 2012-11-09 NOTE — Telephone Encounter (Signed)
Pt. Called because she continues  being shaky all the time even, when she is sitting, and she is light headed when up moving around. According to pt, medication was prescribed at the beginning;   Amiodarone 800 mg then was decreased to 400 mg due to the side effects then to 200 mg once a day, and still can't tolerate this medication. Pt 's PCP suggested for pt to call her cardiologist  to see if he can stop this medication or change it to something else. Pt was seen by scott weaver PA on 10/02/12. I was unable to asked  Lorin Picket for recommendations because, he was too busy with patients. Dr. Clifton James DOD recommended to send this messages to Dr. Daleen Squibb for recommendations, because he knows pt better.

## 2012-11-12 NOTE — Telephone Encounter (Signed)
Pt is aware of Dr. Vern Claude recommendations, to stop AMIODARONE. Pt has and appointment with Lorin Picket weaver PA on July 22 nd 2014. PATIENT AWARE.

## 2012-11-12 NOTE — Telephone Encounter (Signed)
Tell her to stop it. It will take 6-8 weeks to get out of her system. The she felt like she is in normal rhythm. Schedule office visit with me in about 4-6 weeks. If I am not available, schedule with Scott.

## 2013-01-01 ENCOUNTER — Ambulatory Visit: Payer: Medicare Other | Admitting: Physician Assistant

## 2013-01-16 ENCOUNTER — Other Ambulatory Visit: Payer: Self-pay

## 2013-01-16 ENCOUNTER — Ambulatory Visit (INDEPENDENT_AMBULATORY_CARE_PROVIDER_SITE_OTHER): Payer: Medicare Other | Admitting: Physician Assistant

## 2013-01-16 ENCOUNTER — Encounter: Payer: Self-pay | Admitting: Physician Assistant

## 2013-01-16 VITALS — BP 132/73 | HR 68 | Ht 60.0 in | Wt 254.0 lb

## 2013-01-16 DIAGNOSIS — I2510 Atherosclerotic heart disease of native coronary artery without angina pectoris: Secondary | ICD-10-CM

## 2013-01-16 DIAGNOSIS — I5032 Chronic diastolic (congestive) heart failure: Secondary | ICD-10-CM

## 2013-01-16 DIAGNOSIS — R259 Unspecified abnormal involuntary movements: Secondary | ICD-10-CM

## 2013-01-16 DIAGNOSIS — I4891 Unspecified atrial fibrillation: Secondary | ICD-10-CM

## 2013-01-16 MED ORDER — METOPROLOL SUCCINATE ER 25 MG PO TB24: 25.0000 mg | ORAL_TABLET | Freq: Every day | ORAL | Status: DC

## 2013-01-16 NOTE — Patient Instructions (Addendum)
STOP DIGOXIN  START TOPROL XL 25MG  DAILY. AN RX HAS BEEN SENT TO YOUR PHARMACY  Your physician has requested that you have an echocardiogram. Echocardiography is a painless test that uses sound waves to create images of your heart. It provides your doctor with information about the size and shape of your heart and how well your heart's chambers and valves are working. This procedure takes approximately one hour. There are no restrictions for this procedure.  PLEASE SCHEDULE A NEW PATIENT APPOINTMENT TO SEE DR.ALLRED FOR AFIB

## 2013-01-16 NOTE — Progress Notes (Signed)
[address], [address] [place], [place]  [address] Phone: [phone] Fax:  [phone]  Date:  01/16/2013   ID:  [name], DOB Dec 20, 1945, MRN [id number]  PCP:  Juline Patch, MD  Rheumatologist:  Dr. Dierdre Forth Cardiologist:  Dr. Valera Castle => Dr. Cassell Clement    History of Present Illness: [name] is a 67 y.o. female who returns for f/u on AFib.   She has a hx of atrial fibrillation, CAD, HTN, steroid-induced diabetes, dermatomyositis, HL, hypothyroidism, sleep apnea, obesity. LHC 03/2006: EF 65%, mid LAD 40-50%, ostial D1 70-80%, normal circumflex, normal RCA. There was no change compared to 2005.  Medical therapy continued. Lexiscan Myoview 09/2010: No ischemia or scar, EF 75%. Patient saw Dr. Daleen Squibb in 08/2012 wiith recurrent atrial fibrillation. She had been controlled on sotalol for many years. Sotalol was stopped and she was placed on amiodarone.  I saw her 09/2012.  She was in NSR.  But, she had some side effects to the Amiodarone.  We reduced her dose to 200 mg QD.  PFTs 10/2012: normal.  Lexiscan Myoview 10/17/12: normal, EF 71%.    She called the office recently with complaints of lightheadedness and tremulousness. This was felt to be attributed to the amiodarone. Dr. Daleen Squibb recommended discontinuing this and to follow up today.  Symptoms have improved.  She continues to have intention tremors.  No FHx of essential tremors.  She has chronic CP over the years.  She denies changes.  No significant change in DOE.  No orthopnea, PND.  No significant edema.  No syncope.   Labs (3/14):  K 3.9, creatinine 1.06, albumin 3.3, AST 38, ALT 39, Hgb 13, HDL 51, LDL 119, TSH 0.929, digoxin level 0.9  Wt Readings from Last 3 Encounters:  10/15/12 249 lb (112.946 kg)  10/02/12 259 lb 12.8 oz (117.845 kg)  08/30/12 249 lb (112.946 kg)     Past Medical History  Diagnosis Date  . Atrial fibrillation 09/20/2008  . HYPERTENSION, UNSPECIFIED 09/20/2008  . LEG EDEMA, BILATERAL  09/20/2008  . Internal hemorrhoids   . GERD (gastroesophageal reflux disease)   . Hiatal hernia   . Dermatomyositis   . Morbid obesity   . Osteoarthritis   . Hypothyroidism   . Sleep apnea   . History of pancreatitis   . Chronic diastolic heart failure   . Shingles   . Gall stones   . Gout   . Deviated septum   . Hyperlipidemia   . Obesity   . Pancreatitis   . Continuous urine leakage   . CAD (coronary artery disease)     a. LHC 03/2006: EF 65%, mid LAD 40-50%, ostial D1 70-80%, normal circumflex, normal RCA;  b. Lexiscan Myoview 09/2010: No ischemia or scar, EF 75%;  c. Lex MV 5/14:  Normal, no ischemia, EF 71%    Current Outpatient Prescriptions  Medication Sig Dispense Refill  . alendronate (FOSAMAX) 70 MG tablet Take 1 tablet by mouth once a week.      Marland Kitchen allopurinol (ZYLOPRIM) 300 MG tablet Take 300 mg by mouth daily.      Marland Kitchen amiodarone (PACERONE) 200 MG tablet Take 200 mg daily      . Cholecalciferol (VITAMIN D3) 2000 UNITS TABS Take by mouth daily.        . clonazePAM (KLONOPIN) 0.5 MG tablet Take 1 tablet by mouth daily.      . clotrimazole-betamethasone (LOTRISONE) cream as directed.      . digoxin (DIGOX) 0.125  MG tablet Take 1 tablet (0.125 mg total) by mouth daily.  90 tablet  3  . furosemide (LASIX) 40 MG tablet Take one tablet in the am and one-half tablet in the pm  135 tablet  3  . hydrocortisone (ANUSOL-HC) 2.5 % rectal cream Place rectally 2 (two) times daily.      . hydroxychloroquine (PLAQUENIL) 200 MG tablet Take 200 mg by mouth Twice daily.      Marland Kitchen levothyroxine (SYNTHROID, LEVOTHROID) 100 MCG tablet Take 1 tablet by mouth daily.      Marland Kitchen lidocaine (LIDODERM) 5 % Place 1 patch onto the skin daily. Remove & Discard patch within 12 hours or as directed by MD       . methotrexate (RHEUMATREX) 2.5 MG tablet Take 12.5 mg by mouth Once a week.      . Multiple Vitamin (MULTIVITAMIN) tablet Take 1 tablet by mouth daily.        . nitroGLYCERIN (NITROSTAT) 0.4 MG SL tablet  Place 1 tablet (0.4 mg total) under the tongue every 5 (five) minutes as needed.  75 tablet  8  . pantoprazole (PROTONIX) 40 MG tablet Take 40 mg by mouth daily.      . potassium chloride SA (K-DUR,KLOR-CON) 20 MEQ tablet Take 1 tablet (20 mEq total) by mouth 2 (two) times daily.  180 tablet  3  . predniSONE (DELTASONE) 1 MG tablet Take 7 mg by mouth daily.       . traMADol (ULTRAM) 50 MG tablet as needed.      . warfarin (COUMADIN) 5 MG tablet Or as directed  90 tablet  1   No current facility-administered medications for this visit.    Allergies:    Allergies  Allergen Reactions  . Butoconazole   . Lipitor (Atorvastatin Calcium)     Social History:  The patient  reports that she has never smoked. She has never used smokeless tobacco. She reports that she does not drink alcohol or use illicit drugs.   ROS:  Please see the history of present illness.  She has symptomatic cholelithiasis. This is being treated conservatively. She also has right flank pain from recent herpes zoster.  All other systems reviewed and negative.   PHYSICAL EXAM: VS:  BP 132/73  Pulse 68  Ht 5' (1.524 m)  Wt 254 lb (115.214 kg)  BMI 49.61 kg/m2 Well nourished, well developed, in no acute distress HEENT: normal Neck: no JVD  Cardiac:  normal S1, S2; RRR; no murmur Lungs:  clear to auscultation bilaterally, no wheezing, rhonchi or rales Abd: soft, right upper quadrant tenderness to palpation Ext: no pitting edema Skin: warm and dry Neuro:  CNs 2-12 intact, no focal abnormalities noted  EKG:  NSR, HR 68, normal axis, nonspecific ST-T wave changes, QTc 435, PAC     ASSESSMENT AND PLAN:  1. Atrial Fibrillation:  Maintaining sinus rhythm. She has been off of amiodarone for about 8 weeks. We discussed further treatment options to include Tikosyn or possibly ablation (if she is a candidate). With normal LV function, she should not be on digoxin. I will stop her digoxin and start her on Toprol-XL 25 mg daily.  I will obtain an echocardiogram to assess her atrial chamber sizes.  I will refer her to Dr. Hillis Range to further evaluate her AFib and make further recommendations. 2. CAD:  She has chronic, stable chest pain. Recent Myoview low risk. No further workup.  She is intolerant to statins due to myalgias in the  setting of dermatomyositis. 3. Chronic Diastolic CHF:  Volume stable. Continue current therapy. 4. Tremors: She has had plenty of time off of amiodarone. Question if she has benign essential tremors. The addition of a beta blocker may help alleviate these somewhat. If they continue, I have encouraged her to follow up with her PCP to consider neurology referral. 5. Cholelithiasis:  She tells me there are no plans for surgery. 6. Disposition:  Refer to Dr. Hillis Range as noted.  She will follow up with Dr. Cassell Clement in the future.  Signed, Tereso Newcomer, PA-C  2:12 PM 01/16/2013

## 2013-01-22 ENCOUNTER — Ambulatory Visit (HOSPITAL_COMMUNITY): Payer: Medicare Other | Attending: Cardiology

## 2013-01-22 DIAGNOSIS — I10 Essential (primary) hypertension: Secondary | ICD-10-CM | POA: Insufficient documentation

## 2013-01-22 DIAGNOSIS — G4730 Sleep apnea, unspecified: Secondary | ICD-10-CM | POA: Insufficient documentation

## 2013-01-22 DIAGNOSIS — E785 Hyperlipidemia, unspecified: Secondary | ICD-10-CM | POA: Insufficient documentation

## 2013-01-22 DIAGNOSIS — R609 Edema, unspecified: Secondary | ICD-10-CM | POA: Insufficient documentation

## 2013-01-22 DIAGNOSIS — I2510 Atherosclerotic heart disease of native coronary artery without angina pectoris: Secondary | ICD-10-CM

## 2013-01-22 DIAGNOSIS — I4891 Unspecified atrial fibrillation: Secondary | ICD-10-CM

## 2013-01-22 DIAGNOSIS — I509 Heart failure, unspecified: Secondary | ICD-10-CM | POA: Insufficient documentation

## 2013-01-22 DIAGNOSIS — E119 Type 2 diabetes mellitus without complications: Secondary | ICD-10-CM | POA: Insufficient documentation

## 2013-01-22 DIAGNOSIS — I5032 Chronic diastolic (congestive) heart failure: Secondary | ICD-10-CM

## 2013-01-22 NOTE — Progress Notes (Signed)
Echocardiogram performed.  

## 2013-01-25 ENCOUNTER — Telehealth: Payer: Self-pay | Admitting: *Deleted

## 2013-01-25 NOTE — Telephone Encounter (Signed)
Message copied by Burnell Blanks on Fri Jan 25, 2013  5:26 PM ------      Message from: Cassell Clement      Created: Wed Jan 23, 2013  9:14 PM       Please report. The echo was satisfactory.  EF normal. Continue current meds. ------

## 2013-01-25 NOTE — Telephone Encounter (Signed)
Advised of echo  

## 2013-01-26 ENCOUNTER — Encounter: Payer: Self-pay | Admitting: Physician Assistant

## 2013-02-06 ENCOUNTER — Telehealth: Payer: Self-pay | Admitting: Physician Assistant

## 2013-02-06 NOTE — Telephone Encounter (Signed)
New Prob  Pt states that the metoprolol is making her sick. She wants to know if there is something else she can take.

## 2013-02-07 NOTE — Telephone Encounter (Signed)
Spoke with Dr.Allred concerning patient's complaints. He does not feel this is related to the Metoprolol and would like her to call PCP and or GI. She is to stay on Metoprolol and see him in September. Called patient back and she is aware of MD recommendations.

## 2013-02-07 NOTE — Telephone Encounter (Signed)
Called patient back. She states that since starting Metoprolol XL 25mg  she has been complaining of nausea, headache, and constipation. Has appointment with Dr.Allred mid September. Advised will discuss with Dr.Allred and call her back.

## 2013-02-19 ENCOUNTER — Other Ambulatory Visit: Payer: Self-pay

## 2013-02-25 ENCOUNTER — Ambulatory Visit (INDEPENDENT_AMBULATORY_CARE_PROVIDER_SITE_OTHER): Payer: Medicare Other | Admitting: Internal Medicine

## 2013-02-25 ENCOUNTER — Encounter: Payer: Self-pay | Admitting: Internal Medicine

## 2013-02-25 VITALS — BP 150/67 | HR 60 | Ht 60.0 in | Wt 248.0 lb

## 2013-02-25 DIAGNOSIS — I4891 Unspecified atrial fibrillation: Secondary | ICD-10-CM

## 2013-02-25 DIAGNOSIS — I2510 Atherosclerotic heart disease of native coronary artery without angina pectoris: Secondary | ICD-10-CM

## 2013-02-25 NOTE — Patient Instructions (Addendum)
Your physician recommends that you schedule a follow-up appointment in 3 months with Dr Allred    

## 2013-02-27 NOTE — Progress Notes (Signed)
Primary Care Physician: Juline Patch, MD Referring Physician:  Dr Laretta Alstrom is a 67 y.o. female with a h/o atrial fibrillation who presents for EP consultation.  She has multiple comorbidities including CAD, HTN, steroid-induced diabetes, dermatomyositis, HL, hypothyroidism, sleep apnea, and obesity. LHC 03/2006: EF 65%, mid LAD 40-50%, ostial D1 70-80%, normal circumflex, normal RCA. There was no change compared to 2005.  Medical therapy continued. She saw Dr. Daleen Squibb in 08/2012 with recurrent atrial fibrillation. She had previously been controlled with sotalol for many years. Sotalol was stopped and she was placed on amiodarone.  She has recently developed tremors which were felt to be due to the amiodarone. Dr. Daleen Squibb stopped amiodarone and her symptoms improved.  She continues to have intention tremors.   She is unaware of any further afib.  Today, she denies symptoms of palpitations, chest pain, shortness of breath, orthopnea, PND, lower extremity edema, dizziness, presyncope, syncope, or neurologic sequela. The patient is tolerating medications without difficulties and is otherwise without complaint today.   Past Medical History  Diagnosis Date  . Atrial fibrillation 09/20/2008  . HYPERTENSION, UNSPECIFIED 09/20/2008  . LEG EDEMA, BILATERAL 09/20/2008  . Internal hemorrhoids   . GERD (gastroesophageal reflux disease)   . Hiatal hernia   . Dermatomyositis   . Morbid obesity   . Osteoarthritis   . Hypothyroidism   . Sleep apnea   . History of pancreatitis   . Chronic diastolic heart failure   . Shingles   . Gall stones   . Gout   . Deviated septum   . Hyperlipidemia   . Obesity   . Pancreatitis   . Continuous urine leakage   . CAD (coronary artery disease)     a. LHC 03/2006: EF 65%, mid LAD 40-50%, ostial D1 70-80%, normal circumflex, normal RCA;  b. Lexiscan Myoview 09/2010: No ischemia or scar, EF 75%;  c. Lex MV 5/14:  Normal, no ischemia, EF 71%  . Hx of echocardiogram       Echo 8/14:  Mild LVH, EF 55-65%, normal wall motion, Tr AI, mild MR, mod TR, PASP 45   Past Surgical History  Procedure Laterality Date  . Total abdominal hysterectomy    . Nasal septum surgery      Current Outpatient Prescriptions  Medication Sig Dispense Refill  . Cholecalciferol (VITAMIN D3) 2000 UNITS TABS Take by mouth daily.        . clotrimazole-betamethasone (LOTRISONE) cream as directed.      . furosemide (LASIX) 40 MG tablet 40 mg daily.      . hydrocortisone (ANUSOL-HC) 2.5 % rectal cream Place rectally 2 (two) times daily.      . hydroxychloroquine (PLAQUENIL) 200 MG tablet Take 200 mg by mouth Twice daily.      Marland Kitchen levothyroxine (SYNTHROID, LEVOTHROID) 88 MCG tablet Take 88 mcg by mouth daily before breakfast.      . lidocaine (LIDODERM) 5 % Place 1 patch onto the skin daily. Remove & Discard patch within 12 hours or as directed by MD       . methotrexate (RHEUMATREX) 2.5 MG tablet Take 12.5 mg by mouth Once a week.      . metoprolol succinate (TOPROL XL) 25 MG 24 hr tablet Take 1 tablet (25 mg total) by mouth daily.  30 tablet  5  . Multiple Vitamin (MULTIVITAMIN) tablet Take 1 tablet by mouth daily.        . nitroGLYCERIN (NITROSTAT) 0.4 MG SL tablet Place 1  tablet (0.4 mg total) under the tongue every 5 (five) minutes as needed.  75 tablet  8  . pantoprazole (PROTONIX) 40 MG tablet Take 40 mg by mouth daily.      . potassium chloride SA (K-DUR,KLOR-CON) 20 MEQ tablet Take 1 tablet (20 mEq total) by mouth 2 (two) times daily.  180 tablet  3  . predniSONE (DELTASONE) 1 MG tablet Take 5 mg by mouth daily.       . traMADol (ULTRAM) 50 MG tablet as needed.      . warfarin (COUMADIN) 5 MG tablet Or as directed  90 tablet  1   No current facility-administered medications for this visit.    Allergies  Allergen Reactions  . Butoconazole   . Lipitor [Atorvastatin Calcium]     History   Social History  . Marital Status: Married    Spouse Name: N/A    Number of  Children: 2  . Years of Education: N/A   Occupational History  .     Social History Main Topics  . Smoking status: Never Smoker   . Smokeless tobacco: Never Used  . Alcohol Use: No  . Drug Use: No  . Sexual Activity: Not on file   Other Topics Concern  . Not on file   Social History Narrative  . No narrative on file    Family History  Problem Relation Age of Onset  . Heart attack Brother     x 2  . Coronary artery disease Father   . Colon cancer Paternal Uncle   . Kidney disease Brother     ROS- All systems are reviewed and negative except as per the HPI above  Physical Exam: Filed Vitals:   02/25/13 1519  BP: 150/67  Pulse: 60  Height: 5' (1.524 m)  Weight: 248 lb (112.492 kg)    GEN- The patient is overweight appearing, alert and oriented x 3 today.   Head- normocephalic, atraumatic Eyes-  Sclera clear, conjunctiva pink Ears- hearing intact Oropharynx- clear Neck- supple, no JVP Lymph- no cervical lymphadenopathy Lungs- Clear to ausculation bilaterally, normal work of breathing Heart- Regular rate and rhythm, no murmurs, rubs or gallops, PMI not laterally displaced GI- soft, NT, ND, + BS Extremities- no clubbing, cyanosis, or edema MS- no significant deformity or atrophy Skin- no rash or lesion Psych- euthymic mood, full affect Neuro- strength and sensation are intact  EKG today reveals sinus rhythm 60 bpm, poor R wave progression, QTc 448  Assessment and Plan:  1. Atrial fibrillation The patient presents today for EP consultation.  She has atrial fibrillation.  Though she did well for years with sotalol, she was switched to amiodarone due to afib breakthrough.  She did not tolerate amiodarone due to tremors.  I think that our options are limited.  Therapeutic strategies for afib including medicine and ablation were discussed in detail with the patient today. Risk, benefits, and alternatives to EP study and radiofrequency ablation for afib were also  discussed in detail today.  She would like to avoid both AAD and also ablation at this time. We will therefore continue her current regimen and monitor.  If she has recurrence of afib then we will consider restarting sotalol vs proceeding with ablation.  She is appropriately anticoagulated with coumadin at this time.  2. CAD Continue current medical therapy  Return in 3 months for further discussion

## 2013-03-25 ENCOUNTER — Other Ambulatory Visit: Payer: Self-pay | Admitting: *Deleted

## 2013-03-25 DIAGNOSIS — I4891 Unspecified atrial fibrillation: Secondary | ICD-10-CM

## 2013-03-25 MED ORDER — METOPROLOL SUCCINATE ER 25 MG PO TB24: 25.0000 mg | ORAL_TABLET | Freq: Every day | ORAL | Status: DC

## 2013-03-25 MED ORDER — POTASSIUM CHLORIDE CRYS ER 20 MEQ PO TBCR: 20.0000 meq | EXTENDED_RELEASE_TABLET | Freq: Two times a day (BID) | ORAL | Status: DC

## 2013-03-29 ENCOUNTER — Encounter: Payer: Self-pay | Admitting: Internal Medicine

## 2013-04-18 ENCOUNTER — Other Ambulatory Visit: Payer: Self-pay

## 2013-05-27 ENCOUNTER — Ambulatory Visit (INDEPENDENT_AMBULATORY_CARE_PROVIDER_SITE_OTHER): Payer: Medicare Other | Admitting: Internal Medicine

## 2013-05-27 ENCOUNTER — Encounter: Payer: Self-pay | Admitting: Internal Medicine

## 2013-05-27 VITALS — BP 126/86 | HR 55 | Ht 60.0 in | Wt 250.4 lb

## 2013-05-27 DIAGNOSIS — I2510 Atherosclerotic heart disease of native coronary artery without angina pectoris: Secondary | ICD-10-CM

## 2013-05-27 DIAGNOSIS — I4891 Unspecified atrial fibrillation: Secondary | ICD-10-CM

## 2013-05-27 NOTE — Patient Instructions (Signed)
Your physician recommends that you continue on your current medications as directed. Please refer to the Current Medication list given to you today.  Your physician recommends that you schedule a follow-up appointment in: 3 months with Tereso Newcomer, PAC.  Your physician wants you to follow-up in: 6 months with Dr. Johney Frame.  You will receive a reminder letter in the mail two months in advance. If you don't receive a letter, please call our office to schedule the follow-up appointment.

## 2013-05-27 NOTE — Progress Notes (Signed)
PCP: Juline Patch, MD Primary Cardiologist: previously Dr Laretta Alstrom is a 67 y.o. female who presents today for routine electrophysiology followup.  Since last being seen in our clinic, the patient reports doing very well.  She is unaware of any afib.  Today, she denies symptoms of palpitations, chest pain, shortness of breath,  lower extremity edema (above baseline), dizziness, presyncope, or syncope.  The patient is otherwise without complaint today.   Past Medical History  Diagnosis Date  . Atrial fibrillation 09/20/2008  . HYPERTENSION, UNSPECIFIED 09/20/2008  . LEG EDEMA, BILATERAL 09/20/2008  . Internal hemorrhoids   . GERD (gastroesophageal reflux disease)   . Hiatal hernia   . Dermatomyositis   . Morbid obesity   . Osteoarthritis   . Hypothyroidism   . Sleep apnea   . History of pancreatitis   . Chronic diastolic heart failure   . Shingles   . Gall stones   . Gout   . Deviated septum   . Hyperlipidemia   . Obesity   . Pancreatitis   . Continuous urine leakage   . CAD (coronary artery disease)     a. LHC 03/2006: EF 65%, mid LAD 40-50%, ostial D1 70-80%, normal circumflex, normal RCA;  b. Lexiscan Myoview 09/2010: No ischemia or scar, EF 75%;  c. Lex MV 5/14:  Normal, no ischemia, EF 71%  . Hx of echocardiogram     Echo 8/14:  Mild LVH, EF 55-65%, normal wall motion, Tr AI, mild MR, mod TR, PASP 45   Past Surgical History  Procedure Laterality Date  . Total abdominal hysterectomy    . Nasal septum surgery      Current Outpatient Prescriptions  Medication Sig Dispense Refill  . Cholecalciferol (VITAMIN D3) 2000 UNITS TABS Take 2,000 Units by mouth daily.       . clotrimazole-betamethasone (LOTRISONE) cream Apply 1 application topically as directed.       . furosemide (LASIX) 40 MG tablet Take 40 mg by mouth daily.       . hydrocortisone (ANUSOL-HC) 2.5 % rectal cream Place rectally 2 (two) times daily.      . hydroxychloroquine (PLAQUENIL) 200 MG tablet Take 200  mg by mouth Twice daily.      Marland Kitchen levothyroxine (SYNTHROID, LEVOTHROID) 88 MCG tablet Take 88 mcg by mouth daily before breakfast.      . lidocaine (LIDODERM) 5 % Place 1 patch onto the skin daily. Remove & Discard patch within 12 hours or as directed by MD       . methotrexate (RHEUMATREX) 2.5 MG tablet Take 12.5 mg by mouth Once a week.      . metoprolol succinate (TOPROL XL) 25 MG 24 hr tablet Take 1 tablet (25 mg total) by mouth daily.  90 tablet  1  . Multiple Vitamin (MULTIVITAMIN) tablet Take 1 tablet by mouth daily.        . nitroGLYCERIN (NITROSTAT) 0.4 MG SL tablet Place 1 tablet (0.4 mg total) under the tongue every 5 (five) minutes as needed.  75 tablet  8  . pantoprazole (PROTONIX) 40 MG tablet Take 40 mg by mouth daily.      . potassium chloride SA (K-DUR,KLOR-CON) 20 MEQ tablet Take 1 tablet (20 mEq total) by mouth 2 (two) times daily.  180 tablet  1  . predniSONE (DELTASONE) 1 MG tablet Take 5 mg by mouth daily.       . traMADol (ULTRAM) 50 MG tablet Take 50 mg by mouth as needed.       Marland Kitchen  warfarin (COUMADIN) 5 MG tablet Take as directed by the coumadin clinic       No current facility-administered medications for this visit.    Physical Exam: Filed Vitals:   05/27/13 1012  BP: 126/86  Pulse: 55  Height: 5' (1.524 m)  Weight: 250 lb 6.4 oz (113.581 kg)   GEN- The patient is overweight appearing, alert and oriented x 3 today.  Head- normocephalic, atraumatic  Eyes- Sclera clear, conjunctiva pink  Ears- hearing intact  Oropharynx- clear  Neck- supple, no JVP  Lymph- no cervical lymphadenopathy  Lungs- Clear to ausculation bilaterally, normal work of breathing  Heart- Regular rate and rhythm, no murmurs, rubs or gallops, PMI not laterally displaced  GI- soft, NT, ND, + BS  Extremities- no clubbing, cyanosis, or edema   EKG today reveals sinus rhythm 55 bpm, Qtc 409  Assessment and Plan:  1. Atrial fibrillation  Doing well off of AAD therapy. Her tremor is better off  of amiodarone.  If her afib recurs then she would prefer to restart sotalol.  Tikosyn would also be an option.  She is appropriately anticoagulated with coumadin at this time.   2. CAD Continue current medical therapy  Return in 3 months for followup with Tereso Newcomer I will see in 6 months

## 2013-08-26 ENCOUNTER — Encounter: Payer: Self-pay | Admitting: Physician Assistant

## 2013-08-26 ENCOUNTER — Ambulatory Visit (INDEPENDENT_AMBULATORY_CARE_PROVIDER_SITE_OTHER): Payer: Medicare Other | Admitting: Physician Assistant

## 2013-08-26 VITALS — BP 140/90 | HR 56 | Ht 60.0 in | Wt 256.0 lb

## 2013-08-26 DIAGNOSIS — I5032 Chronic diastolic (congestive) heart failure: Secondary | ICD-10-CM

## 2013-08-26 DIAGNOSIS — I4891 Unspecified atrial fibrillation: Secondary | ICD-10-CM

## 2013-08-26 DIAGNOSIS — I10 Essential (primary) hypertension: Secondary | ICD-10-CM

## 2013-08-26 DIAGNOSIS — I2510 Atherosclerotic heart disease of native coronary artery without angina pectoris: Secondary | ICD-10-CM

## 2013-08-26 DIAGNOSIS — E785 Hyperlipidemia, unspecified: Secondary | ICD-10-CM

## 2013-08-26 MED ORDER — NITROGLYCERIN 0.4 MG SL SUBL: 0.4000 mg | SUBLINGUAL_TABLET | SUBLINGUAL | Status: DC | PRN

## 2013-08-26 MED ORDER — FUROSEMIDE 40 MG PO TABS: ORAL_TABLET | ORAL | Status: DC

## 2013-08-26 NOTE — Patient Instructions (Signed)
Your physician has recommended you make the following change in your medication:  1. Use lasix 40 mg 1 tablet for the next 2-3 days and then only as needed for swelling, weight gain and SOB.  Refilled Nitro to pharmacy as well.  Your physician recommends that you schedule a follow-up appointment in: 3 months with Dr. Rayann Heman.

## 2013-08-26 NOTE — Progress Notes (Signed)
[place], [name], [place]  [address] Phone: [phone] Fax:  [phone]  Date:  08/26/2013   ID:  [name], DOB [birth year], MRN [id number]  PCP:  [name] Medal, MD  Cardiologist:  Dr. Thompson [name]     History of Present Illness: [name] is a 68 y.o. female with a hx of atrial fibrillation, CAD, HTN, steroid-induced diabetes, dermatomyositis, HL, hypothyroidism, sleep apnea, obesity. LHC in 03/2006 demonstrated non-obstructive CAD. There was no change compared to 2005 and medical therapy continued.  Patient saw Dr. Verl [name] in 08/2012 wiith recurrent atrial fibrillation. She had been controlled on sotalol for many years. Sotalol was stopped and she was placed on amiodarone. She had some side effects to the Amiodarone and this was ultimately d/c'd.  Lexiscan Myoview in 10/2012 was normal. She established with Dr. Thompson [name] and recommendation was to continue off of AAD therapy.  If she has a recurrence of AFib, we will need to consider resuming Sotalol vs starting Tikosyn vs proceeding with PVI ablation.  Last seen by Dr. Thompson [name] in 05/213.   Since last seen, she has been having some trouble with her L arm.  She has pain from her wrist to her shoulder.  She saw her rheumatologist who gave her a steroid injection last week.  It is feeling somewhat better.  She denies chest pain, significant dyspnea, orthopnea, PND.  She notes increased LE edema recently.  No syncope. No significant palpitations.   Recent Labs: No results found for requested labs within last 365 days.  Wt Readings from Last 3 Encounters:  08/26/13 256 lb (116.121 kg)  05/27/13 250 lb 6.4 oz (113.581 kg)  02/25/13 248 lb (112.492 kg)     Past Medical History  Diagnosis Date  . Atrial fibrillation 09/20/2008  . HYPERTENSION, UNSPECIFIED 09/20/2008  . LEG EDEMA, BILATERAL 09/20/2008  . Internal hemorrhoids   . GERD (gastroesophageal reflux disease)   . Hiatal hernia   . Dermatomyositis   . Morbid  obesity   . Osteoarthritis   . Hypothyroidism   . Sleep apnea   . History of pancreatitis   . Chronic diastolic heart failure   . Shingles   . Gall stones   . Gout   . Deviated septum   . Hyperlipidemia   . Obesity   . Continuous urine leakage   . CAD (coronary artery disease)     a. LHC 03/2006: EF 65%, mid LAD 40-50%, ostial D1 70-80%, normal circumflex, normal RCA;  b. Lexiscan Myoview 09/2010: No ischemia or scar, EF 75%;  c. Lex MV 5/14:  Normal, no ischemia, EF 71%  . Hx of echocardiogram     Echo 8/14:  Mild LVH, EF 55-65%, normal wall motion, Tr AI, mild MR, mod TR, PASP 45  . History of PFTs     PFTs 10/2012: normal.     Current Outpatient Prescriptions  Medication Sig Dispense Refill  . Cholecalciferol (VITAMIN D3) 2000 UNITS TABS Take 2,000 Units by mouth daily.       . clotrimazole-betamethasone (LOTRISONE) cream Apply 1 application topically as directed.       . furosemide (LASIX) 40 MG tablet Take 40 mg by mouth daily.       . hydrocortisone (ANUSOL-HC) 2.5 % rectal cream Place rectally 2 (two) times daily.      . hydroxychloroquine (PLAQUENIL) 200 MG tablet Take 200 mg by mouth Twice daily.      Marland Kitchen levothyroxine (SYNTHROID, LEVOTHROID) 112 MCG  tablet Take 112 mcg by mouth daily before breakfast.      . methotrexate (RHEUMATREX) 2.5 MG tablet Take 12.5 mg by mouth Once a week.      . metoprolol succinate (TOPROL XL) 25 MG 24 hr tablet Take 1 tablet (25 mg total) by mouth daily.  90 tablet  1  . Multiple Vitamin (MULTIVITAMIN) tablet Take 1 tablet by mouth daily.        . nitroGLYCERIN (NITROSTAT) 0.4 MG SL tablet Place 1 tablet (0.4 mg total) under the tongue every 5 (five) minutes as needed.  75 tablet  8  . pantoprazole (PROTONIX) 40 MG tablet Take 40 mg by mouth daily.      . potassium chloride SA (K-DUR,KLOR-CON) 20 MEQ tablet Take 1 tablet (20 mEq total) by mouth 2 (two) times daily.  180 tablet  1  . predniSONE (DELTASONE) 1 MG tablet Take 5 mg by mouth daily.        . traMADol (ULTRAM) 50 MG tablet Take 50 mg by mouth as needed.       . warfarin (COUMADIN) 5 MG tablet Take as directed by the coumadin clinic       No current facility-administered medications for this visit.    Allergies:   Butoconazole and Lipitor   Social History:  The patient  reports that she has never smoked. She has never used smokeless tobacco. She reports that she does not drink alcohol or use illicit drugs.   Family History:  The patient's family history includes Colon cancer in her paternal uncle; Coronary artery disease in her father; Heart attack in her brother; Kidney disease in her brother.   ROS:  Please see the history of present illness.   No bleeding problems.    All other systems reviewed and negative.   PHYSICAL EXAM: VS:  BP 140/90  Pulse 56  Ht 5' (1.524 m)  Wt 256 lb (116.121 kg)  BMI 50.00 kg/m2  SpO2 98% Well nourished, well developed, in no acute distress HEENT: normal Neck: no JVDat 90 degrees Cardiac:  normal S1, S2; RRR; no murmur Lungs:  clear to auscultation bilaterally, no wheezing, rhonchi or rales Abd: soft, nontender, no hepatomegaly Ext: no pitting edema Skin: warm and dry Neuro:  CNs 2-12 intact, no focal abnormalities noted  EKG:  Sinus brady, HR 56, normal axis, no significant change since last tracing     ASSESSMENT AND PLAN:  1. Atrial Fibrillation:  Maintaining NSR.  Coumadin is managed by primary care.  Consider resuming Sotalol vs Tikosyn vs PVI ablation if she has recurrent AFib.  2. CAD:  No angina.  She is not on ASA as she is on coumadin.  She is not on statin Rx due to muscle weakness in the setting of dermatomyositis.  3. Hypertension:  Controlled.  4. Diastolic CHF:  She notes increased LE edema recently.  Weight is up.  She can take Lasix QD for 2-3 days, then resume prn dosing.  5. Hyperlipidemia:  She is not on statin due to worsening weakness in the setting of dermatomyositis.   6. Disposition:  F/u with Dr. Thompson [name] in 3 mos.   Signed, [name] Dopp, PA-C  08/26/2013 10:33 AM

## 2013-09-24 ENCOUNTER — Other Ambulatory Visit: Payer: Self-pay | Admitting: Internal Medicine

## 2013-10-16 ENCOUNTER — Encounter: Payer: Medicare Other | Admitting: Neurology

## 2013-12-06 ENCOUNTER — Ambulatory Visit: Payer: Medicare Other | Admitting: Internal Medicine

## 2013-12-09 ENCOUNTER — Ambulatory Visit (INDEPENDENT_AMBULATORY_CARE_PROVIDER_SITE_OTHER): Payer: Medicare Other | Admitting: Internal Medicine

## 2013-12-09 ENCOUNTER — Encounter: Payer: Self-pay | Admitting: Internal Medicine

## 2013-12-09 VITALS — BP 138/90 | HR 61 | Ht 60.0 in | Wt 258.0 lb

## 2013-12-09 DIAGNOSIS — Z6841 Body Mass Index (BMI) 40.0 and over, adult: Secondary | ICD-10-CM | POA: Insufficient documentation

## 2013-12-09 DIAGNOSIS — I4891 Unspecified atrial fibrillation: Secondary | ICD-10-CM

## 2013-12-09 DIAGNOSIS — I2510 Atherosclerotic heart disease of native coronary artery without angina pectoris: Secondary | ICD-10-CM

## 2013-12-09 DIAGNOSIS — R609 Edema, unspecified: Secondary | ICD-10-CM

## 2013-12-09 NOTE — Progress Notes (Signed)
PCP: Tommy Medal, MD Primary Cardiologist: previously Dr Tasia Catchings is a 68 y.o. female who presents today for routine electrophysiology followup.  Since last being seen in our clinic, the patient reports doing reasonably well.  She is unaware of any afib.  She continues to struggle with obesity/ sedenatry life style, and an inability to try to lose weight.  Today, she denies symptoms of palpitations, chest pain, shortness of breath (above baseline),  lower extremity edema (above baseline), dizziness, presyncope, or syncope.  The patient is otherwise without complaint today.   Past Medical History  Diagnosis Date  . Atrial fibrillation 09/20/2008  . HYPERTENSION, UNSPECIFIED 09/20/2008  . LEG EDEMA, BILATERAL 09/20/2008  . Internal hemorrhoids   . GERD (gastroesophageal reflux disease)   . Hiatal hernia   . Dermatomyositis   . Morbid obesity   . Osteoarthritis   . Hypothyroidism   . Sleep apnea   . History of pancreatitis   . Chronic diastolic heart failure   . Shingles   . Gall stones   . Gout   . Deviated septum   . Hyperlipidemia   . Obesity   . Continuous urine leakage   . CAD (coronary artery disease)     a. LHC 03/2006: EF 65%, mid LAD 40-50%, ostial D1 70-80%, normal circumflex, normal RCA;  b. Lexiscan Myoview 09/2010: No ischemia or scar, EF 75%;  c. Lex MV 5/14:  Normal, no ischemia, EF 71%  . Hx of echocardiogram     Echo 8/14:  Mild LVH, EF 55-65%, normal wall motion, Tr AI, mild MR, mod TR, PASP 45  . History of PFTs     PFTs 10/2012: normal.    Past Surgical History  Procedure Laterality Date  . Total abdominal hysterectomy    . Nasal septum surgery      Current Outpatient Prescriptions  Medication Sig Dispense Refill  . Cholecalciferol (VITAMIN D3) 2000 UNITS TABS Take 2,000 Units by mouth daily.       . clotrimazole-betamethasone (LOTRISONE) cream Apply 1 application topically as directed.       . furosemide (LASIX) 40 MG tablet Take 40 mg by mouth as  needed.      . hydrocortisone (ANUSOL-HC) 2.5 % rectal cream Place rectally 2 (two) times daily.      . hydroxychloroquine (PLAQUENIL) 200 MG tablet Take 200 mg by mouth 2 (two) times daily.       Marland Kitchen levothyroxine (SYNTHROID, LEVOTHROID) 112 MCG tablet Take 112 mcg by mouth daily before breakfast.      . methotrexate (RHEUMATREX) 2.5 MG tablet Take 12.5 mg by mouth once a week.       . metoprolol succinate (TOPROL-XL) 25 MG 24 hr tablet Take 1 tablet by mouth  daily  90 tablet  0  . Multiple Vitamin (MULTIVITAMIN) tablet Take 1 tablet by mouth daily.        . nitroGLYCERIN (NITROSTAT) 0.4 MG SL tablet Place 1 tablet (0.4 mg total) under the tongue every 5 (five) minutes as needed.  25 tablet  12  . pantoprazole (PROTONIX) 40 MG tablet Take 40 mg by mouth daily.      . potassium chloride SA (K-DUR,KLOR-CON) 20 MEQ tablet Take 1 tablet by mouth two  times daily  180 tablet  0  . predniSONE (DELTASONE) 1 MG tablet Take 5 mg by mouth daily.       . traMADol (ULTRAM) 50 MG tablet Take 50 mg by mouth as needed.       Marland Kitchen  warfarin (COUMADIN) 5 MG tablet Take as directed by the coumadin clinic       No current facility-administered medications for this visit.    Physical Exam: Filed Vitals:   12/09/13 1249  BP: 138/90  Pulse: 61  Height: 5' (1.524 m)  Weight: 258 lb (117.028 kg)   GEN- The patient is overweight appearing, alert and oriented x 3 today.  Head- normocephalic, atraumatic  Eyes- Sclera clear, conjunctiva pink  Ears- hearing intact  Oropharynx- clear  Neck- supple,   Lungs- Clear to ausculation bilaterally, normal work of breathing  Heart- Regular rate and rhythm, no murmurs, rubs or gallops, PMI not laterally displaced  GI- soft, NT, ND, + BS  Extremities- no clubbing, cyanosis, or edema   EKG today reveals sinus rhythm 61 bpm, Qtc 442  Assessment and Plan:  1. Atrial fibrillation  Doing well off of AAD therapy. Her tremor is better off of amiodarone.  If her afib recurs then  she would prefer to restart sotalol.  Tikosyn would also be an option.  She is appropriately anticoagulated with coumadin at this time.   2. CAD Continue current medical therapy   3. Morbid obesity Unfortunately she does not appear motivated to make lifestyle changes at this time Weight reduction is encouraged today  She will return to see Richardson Dopp in 6 months I will see in a year

## 2013-12-09 NOTE — Patient Instructions (Signed)
Your physician wants you to follow-up in: 6 months with Scott Weaver, PA and 12 months with Dr Allred You will receive a reminder letter in the mail two months in advance. If you don't receive a letter, please call our office to schedule the follow-up appointment.  

## 2014-01-03 ENCOUNTER — Other Ambulatory Visit: Payer: Self-pay | Admitting: Internal Medicine

## 2014-02-18 ENCOUNTER — Emergency Department (HOSPITAL_COMMUNITY): Payer: Medicare Other

## 2014-02-18 ENCOUNTER — Encounter (HOSPITAL_COMMUNITY): Payer: Self-pay | Admitting: Emergency Medicine

## 2014-02-18 ENCOUNTER — Inpatient Hospital Stay (HOSPITAL_COMMUNITY)
Admission: EM | Admit: 2014-02-18 | Discharge: 2014-03-01 | DRG: 516 | Disposition: A | Discharge status: Home Health | Payer: Medicare Other | Attending: Family Medicine | Admitting: Family Medicine

## 2014-02-18 DIAGNOSIS — Z8249 Family history of ischemic heart disease and other diseases of the circulatory system: Secondary | ICD-10-CM

## 2014-02-18 DIAGNOSIS — M3313 Other dermatomyositis without myopathy: Secondary | ICD-10-CM

## 2014-02-18 DIAGNOSIS — Z7901 Long term (current) use of anticoagulants: Secondary | ICD-10-CM

## 2014-02-18 DIAGNOSIS — R609 Edema, unspecified: Secondary | ICD-10-CM

## 2014-02-18 DIAGNOSIS — G9520 Unspecified cord compression: Secondary | ICD-10-CM

## 2014-02-18 DIAGNOSIS — I872 Venous insufficiency (chronic) (peripheral): Secondary | ICD-10-CM | POA: Diagnosis present

## 2014-02-18 DIAGNOSIS — K219 Gastro-esophageal reflux disease without esophagitis: Secondary | ICD-10-CM | POA: Diagnosis present

## 2014-02-18 DIAGNOSIS — E039 Hypothyroidism, unspecified: Secondary | ICD-10-CM

## 2014-02-18 DIAGNOSIS — S22009A Unspecified fracture of unspecified thoracic vertebra, initial encounter for closed fracture: Secondary | ICD-10-CM | POA: Diagnosis not present

## 2014-02-18 DIAGNOSIS — IMO0002 Reserved for concepts with insufficient information to code with codable children: Secondary | ICD-10-CM | POA: Diagnosis not present

## 2014-02-18 DIAGNOSIS — M546 Pain in thoracic spine: Secondary | ICD-10-CM

## 2014-02-18 DIAGNOSIS — I5032 Chronic diastolic (congestive) heart failure: Secondary | ICD-10-CM | POA: Diagnosis present

## 2014-02-18 DIAGNOSIS — S22080A Wedge compression fracture of T11-T12 vertebra, initial encounter for closed fracture: Secondary | ICD-10-CM

## 2014-02-18 DIAGNOSIS — I4891 Unspecified atrial fibrillation: Secondary | ICD-10-CM | POA: Diagnosis present

## 2014-02-18 DIAGNOSIS — I480 Paroxysmal atrial fibrillation: Secondary | ICD-10-CM

## 2014-02-18 DIAGNOSIS — M1990 Unspecified osteoarthritis, unspecified site: Secondary | ICD-10-CM | POA: Diagnosis present

## 2014-02-18 DIAGNOSIS — Z6841 Body Mass Index (BMI) 40.0 and over, adult: Secondary | ICD-10-CM

## 2014-02-18 DIAGNOSIS — I2510 Atherosclerotic heart disease of native coronary artery without angina pectoris: Secondary | ICD-10-CM | POA: Diagnosis present

## 2014-02-18 DIAGNOSIS — T148XXA Other injury of unspecified body region, initial encounter: Secondary | ICD-10-CM | POA: Diagnosis not present

## 2014-02-18 DIAGNOSIS — I10 Essential (primary) hypertension: Secondary | ICD-10-CM | POA: Diagnosis present

## 2014-02-18 DIAGNOSIS — Z9181 History of falling: Secondary | ICD-10-CM

## 2014-02-18 DIAGNOSIS — R0609 Other forms of dyspnea: Secondary | ICD-10-CM

## 2014-02-18 DIAGNOSIS — R791 Abnormal coagulation profile: Secondary | ICD-10-CM | POA: Diagnosis present

## 2014-02-18 DIAGNOSIS — M3390 Dermatopolymyositis, unspecified, organ involvement unspecified: Secondary | ICD-10-CM | POA: Diagnosis present

## 2014-02-18 DIAGNOSIS — M48061 Spinal stenosis, lumbar region without neurogenic claudication: Secondary | ICD-10-CM | POA: Diagnosis present

## 2014-02-18 DIAGNOSIS — I5033 Acute on chronic diastolic (congestive) heart failure: Secondary | ICD-10-CM | POA: Diagnosis present

## 2014-02-18 DIAGNOSIS — D696 Thrombocytopenia, unspecified: Secondary | ICD-10-CM | POA: Diagnosis present

## 2014-02-18 DIAGNOSIS — R0989 Other specified symptoms and signs involving the circulatory and respiratory systems: Secondary | ICD-10-CM

## 2014-02-18 DIAGNOSIS — K5900 Constipation, unspecified: Secondary | ICD-10-CM

## 2014-02-18 DIAGNOSIS — M109 Gout, unspecified: Secondary | ICD-10-CM | POA: Diagnosis present

## 2014-02-18 DIAGNOSIS — G4730 Sleep apnea, unspecified: Secondary | ICD-10-CM | POA: Diagnosis present

## 2014-02-18 DIAGNOSIS — R52 Pain, unspecified: Secondary | ICD-10-CM

## 2014-02-18 DIAGNOSIS — Z7952 Long term (current) use of systemic steroids: Secondary | ICD-10-CM

## 2014-02-18 LAB — CBC WITH DIFFERENTIAL/PLATELET
Basophils Absolute: 0 10*3/uL (ref 0.0–0.1)
Basophils Relative: 0 % (ref 0–1)
Eosinophils Absolute: 0 10*3/uL (ref 0.0–0.7)
Eosinophils Relative: 0 % (ref 0–5)
HCT: 36.7 % (ref 36.0–46.0)
Hemoglobin: 12.1 g/dL (ref 12.0–15.0)
Lymphocytes Relative: 22 % (ref 12–46)
Lymphs Abs: 1.4 10*3/uL (ref 0.7–4.0)
MCH: 32.1 pg (ref 26.0–34.0)
MCHC: 33 g/dL (ref 30.0–36.0)
MCV: 97.3 fL (ref 78.0–100.0)
Monocytes Absolute: 0.8 10*3/uL (ref 0.1–1.0)
Monocytes Relative: 13 % — ABNORMAL HIGH (ref 3–12)
Neutro Abs: 4.2 10*3/uL (ref 1.7–7.7)
Neutrophils Relative %: 65 % (ref 43–77)
Platelets: 110 10*3/uL — ABNORMAL LOW (ref 150–400)
RBC: 3.77 MIL/uL — ABNORMAL LOW (ref 3.87–5.11)
RDW: 17.9 % — ABNORMAL HIGH (ref 11.5–15.5)
WBC: 6.5 10*3/uL (ref 4.0–10.5)

## 2014-02-18 LAB — TYPE AND SCREEN
ABO/RH(D): O POS
Antibody Screen: NEGATIVE

## 2014-02-18 LAB — COMPREHENSIVE METABOLIC PANEL
ALT: 26 U/L (ref 0–35)
AST: 42 U/L — ABNORMAL HIGH (ref 0–37)
Albumin: 2.4 g/dL — ABNORMAL LOW (ref 3.5–5.2)
Alkaline Phosphatase: 105 U/L (ref 39–117)
Anion gap: 10 (ref 5–15)
BUN: 20 mg/dL (ref 6–23)
CO2: 24 mEq/L (ref 19–32)
Calcium: 8.4 mg/dL (ref 8.4–10.5)
Chloride: 106 mEq/L (ref 96–112)
Creatinine, Ser: 0.92 mg/dL (ref 0.50–1.10)
GFR calc Af Amer: 73 mL/min — ABNORMAL LOW (ref >90)
GFR calc non Af Amer: 63 mL/min — ABNORMAL LOW (ref >90)
Glucose, Bld: 82 mg/dL (ref 70–99)
Potassium: 4.1 mEq/L (ref 3.7–5.3)
Sodium: 140 mEq/L (ref 137–147)
Total Bilirubin: 0.5 mg/dL (ref 0.3–1.2)
Total Protein: 5.6 g/dL — ABNORMAL LOW (ref 6.0–8.3)

## 2014-02-18 LAB — PROTIME-INR
INR: 4.15 — ABNORMAL HIGH (ref 0.00–1.49)
Prothrombin Time: 40.1 seconds — ABNORMAL HIGH (ref 11.6–15.2)

## 2014-02-18 LAB — PRO B NATRIURETIC PEPTIDE: Pro B Natriuretic peptide (BNP): 143.6 pg/mL — ABNORMAL HIGH (ref 0–125)

## 2014-02-18 LAB — ABO/RH: ABO/RH(D): O POS

## 2014-02-18 MED ORDER — LIDOCAINE 5 % EX PTCH
1.0000 | MEDICATED_PATCH | CUTANEOUS | Status: DC
  Administered 2014-02-18 – 2014-02-28 (×10): 1 via TRANSDERMAL
  Filled 2014-02-18 (×14): qty 1

## 2014-02-18 MED ORDER — INFLUENZA VAC SPLIT QUAD 0.5 ML IM SUSY
0.5000 mL | PREFILLED_SYRINGE | INTRAMUSCULAR | Status: AC
  Administered 2014-02-19: 0.5 mL via INTRAMUSCULAR
  Filled 2014-02-18 (×2): qty 0.5

## 2014-02-18 MED ORDER — HYDROXYCHLOROQUINE SULFATE 200 MG PO TABS
200.0000 mg | ORAL_TABLET | Freq: Two times a day (BID) | ORAL | Status: DC
  Administered 2014-02-18 – 2014-03-01 (×22): 200 mg via ORAL
  Filled 2014-02-18 (×26): qty 1

## 2014-02-18 MED ORDER — LEVOTHYROXINE SODIUM 112 MCG PO TABS
112.0000 ug | ORAL_TABLET | Freq: Every day | ORAL | Status: DC
  Administered 2014-02-19 – 2014-02-20 (×2): 112 ug via ORAL
  Filled 2014-02-18 (×3): qty 1

## 2014-02-18 MED ORDER — METHOTREXATE 2.5 MG PO TABS
12.5000 mg | ORAL_TABLET | ORAL | Status: DC
  Administered 2014-02-18 – 2014-02-25 (×2): 12.5 mg via ORAL
  Filled 2014-02-18 (×3): qty 5

## 2014-02-18 MED ORDER — ACETAMINOPHEN 325 MG PO TABS
650.0000 mg | ORAL_TABLET | Freq: Four times a day (QID) | ORAL | Status: DC | PRN
  Administered 2014-02-24 (×3): 650 mg via ORAL
  Filled 2014-02-18 (×3): qty 2

## 2014-02-18 MED ORDER — PREDNISONE 10 MG PO TABS
10.0000 mg | ORAL_TABLET | Freq: Every day | ORAL | Status: DC
  Administered 2014-02-18 – 2014-03-01 (×11): 10 mg via ORAL
  Filled 2014-02-18 (×16): qty 1

## 2014-02-18 MED ORDER — ACETAMINOPHEN 650 MG RE SUPP: 650.0000 mg | Freq: Four times a day (QID) | RECTAL | Status: DC | PRN

## 2014-02-18 MED ORDER — ONDANSETRON HCL 4 MG/2ML IJ SOLN: 4.0000 mg | Freq: Four times a day (QID) | INTRAMUSCULAR | Status: DC | PRN

## 2014-02-18 MED ORDER — HYDROMORPHONE HCL PF 1 MG/ML IJ SOLN
1.0000 mg | Freq: Once | INTRAMUSCULAR | Status: AC
  Administered 2014-02-18: 1 mg via INTRAVENOUS
  Filled 2014-02-18: qty 1

## 2014-02-18 MED ORDER — CYCLOBENZAPRINE HCL 10 MG PO TABS
10.0000 mg | ORAL_TABLET | Freq: Two times a day (BID) | ORAL | Status: DC
  Administered 2014-02-18 – 2014-03-01 (×22): 10 mg via ORAL
  Filled 2014-02-18 (×34): qty 1

## 2014-02-18 MED ORDER — OXYCODONE HCL 5 MG PO TABS
5.0000 mg | ORAL_TABLET | ORAL | Status: DC | PRN
  Administered 2014-02-19 – 2014-03-01 (×13): 5 mg via ORAL
  Filled 2014-02-18 (×15): qty 1

## 2014-02-18 MED ORDER — MORPHINE SULFATE 2 MG/ML IJ SOLN: 2.0000 mg | INTRAMUSCULAR | Status: DC | PRN

## 2014-02-18 MED ORDER — HYDROMORPHONE HCL PF 1 MG/ML IJ SOLN
1.0000 mg | Freq: Once | INTRAMUSCULAR | Status: AC
Start: 2014-02-18 — End: 2014-02-18
  Administered 2014-02-18: 1 mg via INTRAVENOUS
  Filled 2014-02-18: qty 1

## 2014-02-18 MED ORDER — PANTOPRAZOLE SODIUM 40 MG PO TBEC
40.0000 mg | DELAYED_RELEASE_TABLET | Freq: Every day | ORAL | Status: DC
Start: 2014-02-18 — End: 2014-03-01
  Administered 2014-02-18 – 2014-03-01 (×12): 40 mg via ORAL
  Filled 2014-02-18 (×12): qty 1

## 2014-02-18 MED ORDER — METOPROLOL SUCCINATE ER 25 MG PO TB24
25.0000 mg | ORAL_TABLET | Freq: Every day | ORAL | Status: DC
  Administered 2014-02-18 – 2014-03-01 (×12): 25 mg via ORAL
  Filled 2014-02-18 (×13): qty 1

## 2014-02-18 MED ORDER — TRAMADOL HCL 50 MG PO TABS
50.0000 mg | ORAL_TABLET | Freq: Once | ORAL | Status: AC
  Administered 2014-02-18: 50 mg via ORAL
  Filled 2014-02-18: qty 1

## 2014-02-18 MED ORDER — ONDANSETRON HCL 4 MG PO TABS
4.0000 mg | ORAL_TABLET | Freq: Four times a day (QID) | ORAL | Status: DC | PRN
  Filled 2014-02-18: qty 1

## 2014-02-18 MED ORDER — MORPHINE SULFATE 2 MG/ML IJ SOLN
2.0000 mg | INTRAMUSCULAR | Status: DC | PRN
  Administered 2014-02-25: 2 mg via INTRAVENOUS
  Filled 2014-02-18: qty 1

## 2014-02-18 NOTE — ED Notes (Signed)
Per EMS pt was getting out of husbands truck about a week ago, felt something pop/snap in back, has gotten worse, states hurts worse to stand or sit, has not seen MD for this pain.

## 2014-02-18 NOTE — ED Provider Notes (Signed)
Patient seen and examined. Her history reviewed. She complains of lower thoracic upper lumbar pain after stepping out of a pickup truck and landing firmly on both feet 10 days ago. For the first 2 days she was able to get in the bed with help. For the last 8 days she is unable to get him out of her bed. Has a great deal of difficulty getting on and off of the toilet. She has a lift chair that she uses at home because of a history of dermatomyositis. For the last 48 hours she states she is barely able to get out of her chair with help using a few canes and into her bathroom because of her pain. She does not have weakness or paresthesias or loss of sensation or incontinence or retention of stool or urine. She has a history of paroxysmal A. fib and. And is anticoagulated.  X-ray show T12 compression fracture. Exam shows her neurologically intact. Her tenderness localizes to the T12 area. MRI has been requested, and is pending. INR is drawn, and pending.  Patient discussed with Dr.Nundkumar, Neurosurgery.  He felt that all that is necessary would be pain control, TLSO brace, in future consideration for kyphoplasty if pain not improving. He did not feel strongly that MRI needed to be done at this time. He stated that he was not concerned for epidural hematoma with this fracture in an anticoagulated patient. He stated there was no need to stop her anticoagulation at this time.  Patient will need admission for pain control. She'll need fitted for her TLSO brace. She will likely need physical and occupational therapy, and may require short-term rehabilitation for pain control, balance, strength, and conditioning.  Tanna Furry, MD 02/18/14 1400

## 2014-02-18 NOTE — ED Notes (Signed)
Initial contact-A&Ox4. Moving all extremities equally. Report aligns with previous EMS report. Took Tylenol x2 this morning around 0800. C/o lower, bilateral back pain. Denies that pain radiates to legs or shoulders. Denies numbness/tingling. Also reports decreased urination over the last week. Says it has been 5 days since last BM. Has constipation d/t taking tylenol which has happened in the past. No other questions/concerns. PA at bedside.

## 2014-02-18 NOTE — ED Notes (Signed)
MD Jeneen Rinks at bedside to discuss plan of care.

## 2014-02-18 NOTE — ED Provider Notes (Signed)
CSN: [id number]     Arrival date & time 02/18/14  1100 History   First MD Initiated Contact with Patient 02/18/14 1119     Chief Complaint  Patient presents with  . Back Pain     (Consider location/radiation/quality/duration/timing/severity/associated sxs/prior Treatment) HPI BLESSING ZAUCHA is a 68 y.o. female with a history of morbid obesity, dermatomyositis with chronic steroid use comes in for evaluation of low back pain. Patient states a week ago she was getting out of her husband struck when she lost her footing and slid out of the truck the past and abruptly landed on her feet on the ground. She reports a loud pop and immediate piercing pain in the center of her low back that radiated toward the sides. She is tried Tylenol for the pain he experienced minimal relief. She experiences pain at rest and movement exacerbates the pain. Since the event, She has had extreme difficulty ambulating, walks with 2 canes, cannot get in and out of her lift chair or her bed. She does take Coumadin for A fib. No history of cancer, no IV drug abuse, no fevers, no loss of bowel or bladder function, no numbness or weakness.  Past Medical History  Diagnosis Date  . Atrial fibrillation 09/20/2008  . HYPERTENSION, UNSPECIFIED 09/20/2008  . LEG EDEMA, BILATERAL 09/20/2008  . Internal hemorrhoids   . GERD (gastroesophageal reflux disease)   . Hiatal hernia   . Dermatomyositis   . Morbid obesity   . Osteoarthritis   . Hypothyroidism   . Sleep apnea   . History of pancreatitis   . Chronic diastolic heart failure   . Shingles   . Gall stones   . Gout   . Deviated septum   . Hyperlipidemia   . Obesity   . Continuous urine leakage   . CAD (coronary artery disease)     a. LHC 03/2006: EF 65%, mid LAD 40-50%, ostial D1 70-80%, normal circumflex, normal RCA;  b. Lexiscan Myoview 09/2010: No ischemia or scar, EF 75%;  c. Lex MV 5/14:  Normal, no ischemia, EF 71%  . Hx of echocardiogram     Echo 8/14:  Mild LVH, EF  55-65%, normal wall motion, Tr AI, mild MR, mod TR, PASP 45  . History of PFTs     PFTs 10/2012: normal.    Past Surgical History  Procedure Laterality Date  . Total abdominal hysterectomy    . Nasal septum surgery     Family History  Problem Relation Age of Onset  . Heart attack Brother     x 2  . Coronary artery disease Father   . Colon cancer Paternal Uncle   . Kidney disease Brother    History  Substance Use Topics  . Smoking status: Never Smoker   . Smokeless tobacco: Never Used  . Alcohol Use: No   OB History   Grav Para Term Preterm Abortions TAB SAB Ect Mult Living                 Review of Systems  Constitutional: Negative for fever.  HENT: Negative for congestion.   Respiratory: Negative for shortness of breath.   Cardiovascular: Negative for chest pain.  Genitourinary: Negative for dysuria.  Musculoskeletal: Positive for back pain.      Allergies  Butoconazole and Lipitor  Home Medications   Prior to Admission medications   Medication Sig Start Date End Date Taking? Authorizing Provider  acetaminophen (TYLENOL) 500 MG tablet Take 1,000 mg by mouth  every 6 (six) hours as needed for mild pain.   Yes Historical Provider, MD  Cholecalciferol (VITAMIN D3) 2000 UNITS TABS Take 2,000 Units by mouth daily.    Yes Historical Provider, MD  cyclobenzaprine (FLEXERIL) 10 MG tablet Take 10 mg by mouth 2 (two) times daily.   Yes Historical Provider, MD  furosemide (LASIX) 40 MG tablet Take 40 mg by mouth as needed for fluid.  08/26/13  Yes Scott Joylene Draft, PA-C  hydrocortisone (ANUSOL-HC) 2.5 % rectal cream Place rectally 2 (two) times daily.   Yes Historical Provider, MD  hydroxychloroquine (PLAQUENIL) 200 MG tablet Take 200 mg by mouth 2 (two) times daily.  08/06/10  Yes Historical Provider, MD  levothyroxine (SYNTHROID, LEVOTHROID) 112 MCG tablet Take 112 mcg by mouth daily before breakfast.   Yes Historical Provider, MD  methotrexate (RHEUMATREX) 2.5 MG tablet Take  12.5 mg by mouth once a week.  08/06/10  Yes Historical Provider, MD  metoprolol succinate (TOPROL-XL) 25 MG 24 hr tablet Take 1 tablet by mouth  daily   Yes [name], MD  Multiple Vitamin (MULTIVITAMIN) tablet Take 1 tablet by mouth daily.     Yes Historical Provider, MD  nitroGLYCERIN (NITROSTAT) 0.4 MG SL tablet Place 1 tablet (0.4 mg total) under the tongue every 5 (five) minutes as needed. 08/26/13  Yes Scott T Kathlen Mody, PA-C  pantoprazole (PROTONIX) 40 MG tablet Take 40 mg by mouth daily. 08/09/10  Yes Historical Provider, MD  potassium chloride SA (K-DUR,KLOR-CON) 20 MEQ tablet Take 1 tablet by mouth two  times daily   Yes [name], MD  predniSONE (DELTASONE) 1 MG tablet Take 5 mg by mouth daily.  08/17/10  Yes Historical Provider, MD  traMADol (ULTRAM) 50 MG tablet Take 50 mg by mouth every 6 (six) hours as needed for moderate pain.  08/29/12  Yes Historical Provider, MD  warfarin (COUMADIN) 5 MG tablet Take 2.5-5 mg by mouth daily at 6 PM. 5mg  on Tuesday and Thursday . 2.5 mg Su,Mo,We,Fr,Sa 07/11/11  Yes Renella Cunas, MD   BP 118/42  Pulse 76  Temp(Src) 97.6 F (36.4 C) (Oral)  Resp 19  SpO2 95% Physical Exam  Nursing note and vitals reviewed. Constitutional:  Awake, alert, nontoxic appearance with baseline speech.  HENT:  Head: Atraumatic.  Eyes: Pupils are equal, round, and reactive to light. Right eye exhibits no discharge. Left eye exhibits no discharge.  Neck: Neck supple.  Cardiovascular: Normal rate and regular rhythm.   No murmur heard. Pulmonary/Chest: Effort normal and breath sounds normal. No respiratory distress. She has no wheezes. She has no rales. She exhibits no tenderness.  Abdominal: Soft. Bowel sounds are normal. She exhibits no mass. There is no tenderness. There is no rebound.  Musculoskeletal:       Thoracic back: She exhibits no tenderness.       Lumbar back: She exhibits no tenderness.  Patient unable to roll on side for back exam due to discomfort.  Unable to palpate spine or paravertebral muscles. Bilateral lower extremities non tender without new rashes or color change, baseline ROM decreased due to discomfort, but with intact DP / PT pulses, CR<2 secs all digits bilaterally, sensation baseline light touch bilaterally for pt,  motor symmetric bilateral foot dorsiflexion, foot plantarflexion. Unable to assess hip flexion or extension. She unable to get out of bed in order to assess gait.  Neurological:  Mental status baseline for patient.  Upper extremity motor strength and sensation intact and symmetric bilaterally.  Skin: No rash noted.  Psychiatric: She has a normal mood and affect.    ED Course  Procedures (including critical care time) Labs Review Labs Reviewed  CBC WITH DIFFERENTIAL  COMPREHENSIVE METABOLIC PANEL  PROTIME-INR  TYPE AND SCREEN    Imaging Review Dg Lumbar Spine Complete  02/18/2014   CLINICAL DATA:  Fall.  Low back pain.  EXAM: LUMBAR SPINE - COMPLETE 4+ VIEW  COMPARISON:  None.  FINDINGS: There is a mild levoconvex curve on the frontal view of the lumbar spine. Large stool burden is incidentally noted. Lumbar vertebral body height is preserved. Intervertebral disc spaces in the lumbar spine are within normal limits. Mild abdominal aortic atherosclerosis.  There is a T12 compression fracture with about 70% loss of anterior vertebral body height and retropulsion. Retropulsion appears to measure about 7 mm. No other compression fractures are identified.  IMPRESSION: 1. T12 compression fracture with 70% loss of anterior vertebral body height and retropulsion. Consider followup MRI for further assessment of the age and central canal compromise. 2. Normal appearance of the lumbar spine for age aside from a mild levoconvex curve.   Electronically Signed   By: Dereck Ligas M.D.   On: 02/18/2014 12:20     EKG Interpretation None     Meds given in ED:  Medications  traMADol (ULTRAM) tablet 50 mg (50 mg Oral Given  02/18/14 1214)  HYDROmorphone (DILAUDID) injection 1 mg (1 mg Intravenous Given 02/18/14 1310)    New Prescriptions   No medications on file   Filed Vitals:   02/18/14 1331  BP: 118/42  Pulse: 76  Temp:   Resp: 19    MDM  Vitals stable - WNL -afebrile Pt resting comfortably in ED with pain controlled PE limited but shows no focal neuro deficit. Xray shows T12 Compression frx with retropulsion, will obtain MRI for further evaluation. Discussed pt with Dr. Jeneen Rinks, consulted Neurosurgery, will admit to hospital for pain control and fitting of TLSO brace as well as PT/OT.   Care transferred to Dr. Jeneen Rinks at this time. Final diagnoses:  None  Prior to patient discharge, I discussed and reviewed this case with Dr.Mark Kayleen Memos, PA-C 02/18/14 1402

## 2014-02-18 NOTE — ED Notes (Signed)
Pt has clean diaper on and is alert and oriented to self. States she is dry and clean and does not need new diaper at this time. RN on floor aware.

## 2014-02-18 NOTE — ED Notes (Signed)
Bed: PV66 Expected date:  Expected time:  Means of arrival:  Comments: ems- back pain. Charge will get report

## 2014-02-18 NOTE — Progress Notes (Signed)
ANTICOAGULATION CONSULT NOTE - Initial Consult  Pharmacy Consult for Heparin Indication: atrial fibrillation  Allergies  Allergen Reactions  . Butoconazole     'Redness and swelling feverish ' 'worse symptoms '  . Lipitor [Atorvastatin Calcium] Other (See Comments)    'Muscle weakness'      Patient Measurements: Height: 5' (152.4 cm) Weight: 260 lb (117.935 kg) IBW/kg (Calculated) : 45.5 Heparin Dosing Weight: 75 kg  Vital Signs: Temp: 97.6 F (36.4 C) (09/08 1129) Temp src: Oral (09/08 1129) BP: 134/55 mmHg (09/08 1509) Pulse Rate: 74 (09/08 1509)  Labs:  Recent Labs  02/18/14 1331  HGB 12.1  HCT 36.7  PLT 110*  LABPROT 40.1*  INR 4.15*  CREATININE 0.92    Estimated Creatinine Clearance: 69.8 ml/min (by C-G formula based on Cr of 0.92).   Medical History: Past Medical History  Diagnosis Date  . Atrial fibrillation 09/20/2008  . HYPERTENSION, UNSPECIFIED 09/20/2008  . LEG EDEMA, BILATERAL 09/20/2008  . Internal hemorrhoids   . GERD (gastroesophageal reflux disease)   . Hiatal hernia   . Dermatomyositis   . Morbid obesity   . Osteoarthritis   . Hypothyroidism   . Sleep apnea   . History of pancreatitis   . Chronic diastolic heart failure   . Shingles   . Gall stones   . Gout   . Deviated septum   . Hyperlipidemia   . Obesity   . Continuous urine leakage   . CAD (coronary artery disease)     a. LHC 03/2006: EF 65%, mid LAD 40-50%, ostial D1 70-80%, normal circumflex, normal RCA;  b. Lexiscan Myoview 09/2010: No ischemia or scar, EF 75%;  c. Lex MV 5/14:  Normal, no ischemia, EF 71%  . Hx of echocardiogram     Echo 8/14:  Mild LVH, EF 55-65%, normal wall motion, Tr AI, mild MR, mod TR, PASP 45  . History of PFTs     PFTs 10/2012: normal.     Medications:  Scheduled:  . cyclobenzaprine  10 mg Oral BID  . hydroxychloroquine  200 mg Oral BID  . [START ON 02/19/2014] Influenza vac split quadrivalent PF  0.5 mL Intramuscular Tomorrow-1000  . [START ON  02/19/2014] levothyroxine  112 mcg Oral QAC breakfast  . methotrexate  12.5 mg Oral Weekly  . metoprolol succinate  25 mg Oral Daily  . pantoprazole  40 mg Oral Daily  . predniSONE  10 mg Oral Q breakfast   Infusions:   PRN: morphine injection  Assessment: 68 yo female on chronic warfarin therapy for afib. Admitted 9/8 with back pain after an injury 1 week ago. Pharmacy is asked to dose IV heparin as warfarin will be held in anticipation of surgery.  INR today = 4.15  Home warfarin dose 2.5mg  MWFSS, 5mg  TTh, last taken 9/7  H/H wnl, Plts low at 110k (no baseline to compare)  Goal of Therapy:  Heparin level 0.3-0.7 units/ml Monitor platelets by anticoagulation protocol: Yes   Plan:   No heparin until INR < 2.0  Check INR daily  Peggyann Juba, PharmD, BCPS Pager: [phone] 02/18/2014,6:58 PM

## 2014-02-18 NOTE — H&P (Signed)
History and Physical  [name] CHE:[id number] DOB: [birth year] DOA: 02/18/2014  Referring physician: Tanna Furry, ER physician PCP: Tommy Medal, MD   Chief Complaint: Back pain  HPI: AMYA HLAD is a 68 y.o. female  Past medical history of atrial fibrillation on chronic Coumadin, chronic diastolic heart failure and chronic steroid use secondary to dermatomyositis who one week prior was getting out of her husband's elevated truck which was approximately 2 feet from the ground landing on both feet. Immediately upon landing, she felt something pop in her lower back she started having significant pain. Pain was described as right in the middle of her lower back radiating left and right, but nowhere else. She's had no associated numbness or other symptoms. Positioning sometimes exacerbates her pain Over the past week, patient tried to tolerate this, it continued to get worse she came into the emergency room. X-rays done noted in acute T12 compression fracture with 80% loss. Case discussed with neurosurgery who recommended back brace and no further imaging. Hospitalists were called for further evaluation and admission. Labs were only noteworthy for an elevated INR of 4.15   Review of Systems:  Patient seen after arrival to floor. Pt complains of back pain in her lower back as described above. Patient has not had a bowel movement for 5 days. She also has chronic lower extremity edema  Pt denies any headaches, vision changes, dysphasia, chest pain, palpitations, shortness of breath, wheeze, cough, hematuria, dysuria, diarrhea, focal extremity numbness or weakness or pain.  Review of systems are otherwise negative  Past Medical History  Diagnosis Date  . Atrial fibrillation 09/20/2008  . HYPERTENSION, UNSPECIFIED 09/20/2008  . LEG EDEMA, BILATERAL 09/20/2008  . Internal hemorrhoids   . GERD (gastroesophageal reflux disease)   . Hiatal hernia   . Dermatomyositis   . Morbid obesity   . Osteoarthritis    . Hypothyroidism   . Sleep apnea   . History of pancreatitis   . Chronic diastolic heart failure   . Shingles   . Gall stones   . Gout   . Deviated septum   . Hyperlipidemia   . Obesity   . Continuous urine leakage   . CAD (coronary artery disease)     a. LHC 03/2006: EF 65%, mid LAD 40-50%, ostial D1 70-80%, normal circumflex, normal RCA;  b. Lexiscan Myoview 09/2010: No ischemia or scar, EF 75%;  c. Lex MV 5/14:  Normal, no ischemia, EF 71%  . Hx of echocardiogram     Echo 8/14:  Mild LVH, EF 55-65%, normal wall motion, Tr AI, mild MR, mod TR, PASP 45  . History of PFTs     PFTs 10/2012: normal.    Past Surgical History  Procedure Laterality Date  . Total abdominal hysterectomy    . Nasal septum surgery     Social History:  reports that she has never smoked. She has never used smokeless tobacco. She reports that she does not drink alcohol or use illicit drugs. Patient lives at home with her husband & is able to participate in activities of daily living with some assistance, often requiring lift chair to get up, but unable to use a walker or 2 canes for short distances  Allergies  Allergen Reactions  . Butoconazole     'Redness and swelling feverish ' 'worse symptoms '  . Lipitor [Atorvastatin Calcium] Other (See Comments)    'Muscle weakness'      Family History  Problem Relation Age of Onset  .  Heart attack Brother     x 2  . Coronary artery disease Father   . Colon cancer Paternal Uncle   . Kidney disease Brother     As confirmed by patient and her husband  Prior to Admission medications   Medication Sig Start Date End Date Taking? Authorizing Provider  acetaminophen (TYLENOL) 500 MG tablet Take 1,000 mg by mouth every 6 (six) hours as needed for mild pain.   Yes Historical Provider, MD  Cholecalciferol (VITAMIN D3) 2000 UNITS TABS Take 2,000 Units by mouth daily.    Yes Historical Provider, MD  cyclobenzaprine (FLEXERIL) 10 MG tablet Take 10 mg by mouth 2 (two)  times daily.   Yes Historical Provider, MD  furosemide (LASIX) 40 MG tablet Take 40 mg by mouth as needed for fluid.  08/26/13  Yes Scott Joylene Draft, PA-C  hydrocortisone (ANUSOL-HC) 2.5 % rectal cream Place rectally 2 (two) times daily.   Yes Historical Provider, MD  hydroxychloroquine (PLAQUENIL) 200 MG tablet Take 200 mg by mouth 2 (two) times daily.  08/06/10  Yes Historical Provider, MD  levothyroxine (SYNTHROID, LEVOTHROID) 112 MCG tablet Take 112 mcg by mouth daily before breakfast.   Yes Historical Provider, MD  methotrexate (RHEUMATREX) 2.5 MG tablet Take 12.5 mg by mouth once a week.  08/06/10  Yes Historical Provider, MD  metoprolol succinate (TOPROL-XL) 25 MG 24 hr tablet Take 1 tablet by mouth  daily   Yes [name], MD  Multiple Vitamin (MULTIVITAMIN) tablet Take 1 tablet by mouth daily.     Yes Historical Provider, MD  nitroGLYCERIN (NITROSTAT) 0.4 MG SL tablet Place 1 tablet (0.4 mg total) under the tongue every 5 (five) minutes as needed. 08/26/13  Yes Scott T Kathlen Mody, PA-C  pantoprazole (PROTONIX) 40 MG tablet Take 40 mg by mouth daily. 08/09/10  Yes Historical Provider, MD  potassium chloride SA (K-DUR,KLOR-CON) 20 MEQ tablet Take 1 tablet by mouth two  times daily   Yes [name], MD  predniSONE (DELTASONE) 1 MG tablet Take 5 mg by mouth daily.  08/17/10  Yes Historical Provider, MD  traMADol (ULTRAM) 50 MG tablet Take 50 mg by mouth every 6 (six) hours as needed for moderate pain.  08/29/12  Yes Historical Provider, MD  warfarin (COUMADIN) 5 MG tablet Take 2.5-5 mg by mouth daily at 6 PM. 5mg  on Tuesday and Thursday . 2.5 mg Su,Mo,We,Fr,Sa 07/11/11  Yes Renella Cunas, MD    Physical Exam: BP 134/55  Pulse 74  Temp(Src) 97.6 F (36.4 C) (Oral)  Resp 18  Ht 5' (1.524 m)  Wt 117.935 kg (260 lb)  BMI 50.78 kg/m2  SpO2 96%  General:  Alert and oriented x3, no acute distress Eyes: Sclera nonicteric, extraocular movements are intact ENT: Normocephalic, atraumatic, mucous  membranes are moist Neck: Thick, no carotid bruits Cardiovascular: Regular rate and rhythm, occasional ectopic beat Respiratory: Decreased breath sounds throughout secondary to body habitus Abdomen: Soft, obese, nontender, positive bowel sounds Skin: No visible skin breaks, tears or lesions, or lower extremities he does have sinus chronic venous stasis Musculoskeletal: No clubbing or cyanosis, 2-3+ pitting edema from the knees down Psychiatric: Patient is appropriate, no evidence of psychoses Neurologic: No focal deficits           Labs on Admission:  Basic Metabolic Panel:  Recent Labs Lab 02/18/14 1331  NA 140  K 4.1  CL 106  CO2 24  GLUCOSE 82  BUN 20  CREATININE 0.92  CALCIUM 8.4  Liver Function Tests:  Recent Labs Lab 02/18/14 1331  AST 42*  ALT 26  ALKPHOS 105  BILITOT 0.5  PROT 5.6*  ALBUMIN 2.4*   No results found for this basename: LIPASE, AMYLASE,  in the last 168 hours No results found for this basename: AMMONIA,  in the last 168 hours CBC:  Recent Labs Lab 02/18/14 1331  WBC 6.5  NEUTROABS 4.2  HGB 12.1  HCT 36.7  MCV 97.3  PLT 110*   Cardiac Enzymes: No results found for this basename: CKTOTAL, CKMB, CKMBINDEX, TROPONINI,  in the last 168 hours  BNP (last 3 results) No results found for this basename: PROBNP,  in the last 8760 hours CBG: No results found for this basename: GLUCAP,  in the last 168 hours  Radiological Exams on Admission: Dg Lumbar Spine Complete  02/18/2014   CLINICAL DATA:  Fall.  Low back pain.  EXAM: LUMBAR SPINE - COMPLETE 4+ VIEW  COMPARISON:  None.  FINDINGS: There is a mild levoconvex curve on the frontal view of the lumbar spine. Large stool burden is incidentally noted. Lumbar vertebral body height is preserved. Intervertebral disc spaces in the lumbar spine are within normal limits. Mild abdominal aortic atherosclerosis.  There is a T12 compression fracture with about 70% loss of anterior vertebral body height and  retropulsion. Retropulsion appears to measure about 7 mm. No other compression fractures are identified.  IMPRESSION: 1. T12 compression fracture with 70% loss of anterior vertebral body height and retropulsion. Consider followup MRI for further assessment of the age and central canal compromise. 2. Normal appearance of the lumbar spine for age aside from a mild levoconvex curve.   Electronically Signed   By: Dereck Ligas M.D.   On: 02/18/2014 12:20    EKG: Independently reviewed. Sinus rhythm, occasional PVC  Assessment/Plan Present on Admission:  . T12 compression fracture: Treat with when necessary pain medication plus lidocaine patch. Have consulted IR to evaluate patient  . HYPERTENSION, UNSPECIFIED: Continue home medications.  . Morbid obesity: Patient is criteria with BMI greater than 40.  . Atrial fibrillation: Currently in normal sinus rhythm. On chronic anticoagulation. See below.  . LEG EDEMA, BILATERAL: Chronic venous stasis secondary to morbid obesity plus steroids plus history of heart failure  Coagulopathy: INR 4. We'll check BNP to ensure that she does not have a stalk heart failure causing secondary hepatic congestion and decreased Coumadin clearance. In addition, holding Coumadin and starting heparin per pharmacy for possibility of kyphoplasty.  Constipation: Start bowel regimen  Chronic diastolic heart failure: Checking BNP. See above.   History of dermatomyositis on chronic steroids: Patient on stress dose steroids  Consultants: Interventional radiology consult entered  Code Status: DO NOT RESUSCITATE as confirmed by patient  Family Communication: Husband at the bedside   Disposition Plan: Likely here for several days while we decide on treatment plan  Time spent: 82 minutes  [facility] Pager [phone]  **Disclaimer: This note may have been dictated with voice recognition software. Similar sounding words can inadvertently be  transcribed and this note may contain transcription errors which may not have been corrected upon publication of note.**

## 2014-02-19 ENCOUNTER — Observation Stay (HOSPITAL_COMMUNITY): Payer: Medicare Other

## 2014-02-19 ENCOUNTER — Inpatient Hospital Stay (HOSPITAL_COMMUNITY)
Admit: 2014-02-19 | Discharge: 2014-02-19 | Disposition: A | Discharge status: Home / Self Care | Payer: Medicare Other | Attending: Radiology | Admitting: Radiology

## 2014-02-19 DIAGNOSIS — I4891 Unspecified atrial fibrillation: Secondary | ICD-10-CM

## 2014-02-19 DIAGNOSIS — M3390 Dermatopolymyositis, unspecified, organ involvement unspecified: Secondary | ICD-10-CM

## 2014-02-19 DIAGNOSIS — K5900 Constipation, unspecified: Secondary | ICD-10-CM

## 2014-02-19 DIAGNOSIS — S22009A Unspecified fracture of unspecified thoracic vertebra, initial encounter for closed fracture: Secondary | ICD-10-CM | POA: Diagnosis not present

## 2014-02-19 LAB — CBC
HCT: 35.6 % — ABNORMAL LOW (ref 36.0–46.0)
Hemoglobin: 11.4 g/dL — ABNORMAL LOW (ref 12.0–15.0)
MCH: 31.8 pg (ref 26.0–34.0)
MCHC: 32 g/dL (ref 30.0–36.0)
MCV: 99.4 fL (ref 78.0–100.0)
Platelets: 93 10*3/uL — ABNORMAL LOW (ref 150–400)
RBC: 3.58 MIL/uL — ABNORMAL LOW (ref 3.87–5.11)
RDW: 17.8 % — ABNORMAL HIGH (ref 11.5–15.5)
WBC: 4.9 10*3/uL (ref 4.0–10.5)

## 2014-02-19 LAB — BASIC METABOLIC PANEL
Anion gap: 7 (ref 5–15)
BUN: 20 mg/dL (ref 6–23)
CO2: 25 mEq/L (ref 19–32)
Calcium: 8.4 mg/dL (ref 8.4–10.5)
Chloride: 105 mEq/L (ref 96–112)
Creatinine, Ser: 0.96 mg/dL (ref 0.50–1.10)
GFR calc Af Amer: 69 mL/min — ABNORMAL LOW (ref >90)
GFR calc non Af Amer: 60 mL/min — ABNORMAL LOW (ref >90)
Glucose, Bld: 92 mg/dL (ref 70–99)
Potassium: 4.8 mEq/L (ref 3.7–5.3)
Sodium: 137 mEq/L (ref 137–147)

## 2014-02-19 LAB — PROTIME-INR
INR: 4.01 — ABNORMAL HIGH (ref 0.00–1.49)
Prothrombin Time: 39.1 seconds — ABNORMAL HIGH (ref 11.6–15.2)

## 2014-02-19 MED ORDER — SENNOSIDES-DOCUSATE SODIUM 8.6-50 MG PO TABS
1.0000 | ORAL_TABLET | Freq: Every day | ORAL | Status: DC
  Administered 2014-02-19 – 2014-02-28 (×5): 1 via ORAL
  Filled 2014-02-19 (×12): qty 1

## 2014-02-19 MED ORDER — POLYETHYLENE GLYCOL 3350 17 G PO PACK
17.0000 g | PACK | Freq: Two times a day (BID) | ORAL | Status: DC
  Administered 2014-02-19 – 2014-03-01 (×9): 17 g via ORAL
  Filled 2014-02-19 (×14): qty 1

## 2014-02-19 MED ORDER — KETOROLAC TROMETHAMINE 30 MG/ML IJ SOLN
30.0000 mg | Freq: Four times a day (QID) | INTRAMUSCULAR | Status: DC | PRN
  Filled 2014-02-19: qty 1

## 2014-02-19 NOTE — Progress Notes (Signed)
Utilization review completed.  

## 2014-02-19 NOTE — Progress Notes (Signed)
[place] for Heparin Indication: atrial fibrillation  Allergies  Allergen Reactions  . Butoconazole     'Redness and swelling feverish ' 'worse symptoms '  . Lipitor [Atorvastatin Calcium] Other (See Comments)    'Muscle weakness'      Patient Measurements: Height: 5' (152.4 cm) Weight: 260 lb (117.935 kg) IBW/kg (Calculated) : 45.5 Heparin Dosing Weight: 75 kg  Vital Signs: Temp: 98.9 F (37.2 C) (09/09 0525) Temp src: Oral (09/09 0525) BP: 130/68 mmHg (09/09 0525) Pulse Rate: 71 (09/09 0525)  Labs:  Recent Labs  02/18/14 1331 02/19/14 0447  HGB 12.1 11.4*  HCT 36.7 35.6*  PLT 110* 93*  LABPROT 40.1* 39.1*  INR 4.15* 4.01*  CREATININE 0.92 0.96    Estimated Creatinine Clearance: 66.9 ml/min (by C-G formula based on Cr of 0.96).   Assessment: 68 yo female on chronic warfarin therapy for afib. Admitted 9/8 with back pain after an injury 1 week ago. Pharmacy is asked to dose IV heparin as warfarin will be held in anticipation of surgery.  Home warfarin dose 2.5mg  MWFSS, 5mg  TTh, last taken 9/7  Today: 9/9  INR today = 4.01  Hgb slightly low, Plts low at 93k - trending down from 9/8 (no baseline to compare)  Goal of Therapy:  Heparin level 0.3-0.7 units/ml Monitor platelets by anticoagulation protocol: Yes   Plan:   Continue to hold warfarin with plans to transition to heparin gtt when INR < 2 for possible kyphoplasty secondary to compression fracture  No heparin until INR < 2.0  Check INR daily  Monitor CBC - platelets  Doreene Eland, PharmD, BCPS.   Pager: [phone] 02/19/2014,8:21 AM

## 2014-02-19 NOTE — Progress Notes (Addendum)
IR PA aware of request for Kyphoplasty. Dr. Estanislado Pandy has reviewed this case and recommends MRI for further evaluation. MRI has been ordered. INR is 4.01 today and will need to be < 1.5 if Kyphoplasty approved.  Tsosie Billing PA-C Interventional Radiology  02/19/14  1:43 PM

## 2014-02-19 NOTE — Progress Notes (Signed)
[facility] PROGRESS NOTE  [name] NOI:[id number] DOB: 1945/11/23 DOA: 02/18/2014 PCP: Tommy Medal, MD  Assessment/Plan: #1 T12 compression fracture Likely mechanical in nature. Patient states pain has improved since admission however has not moved around. Per admitting physician, Dr. Maryland Pink, neurosurgery was curbside and recommended brace with conservative treatment. Interventional radiology consultation pending for evaluation for possible kyphoplasty. Continue current pain regimen with Lidoderm patch and when necessary oxycodone as needed for breakthrough pain.  #2 hypothyroidism Continue Synthroid.  #3 atrial fibrillation Continue metoprolol for rate control. Coumadin on hold secondary to supratherapeutic INR.  #4 hypertension Stable. Continue metoprolol.  #5 constipation Continue MiraLAX twice daily and Senokot at bedtime. If no bowel movement will give a bottle of magnesium citrate.  #6 chronic diastolic heart failure Stable. Pro BNP was 143.6. Continue metoprolol. Follow.  #7 thrombocytopenia Questionable etiology. No active bleeding. We'll monitor for now.  #8 morbid obesity  #9 bilateral lower extremity edema Likely secondary to chronic venous stasis secondary to morbid obesity in the setting of steroids. Patient states this has been chronic. Pro BNP was 143.6. Follow.  #10 coagulopathy INR of 4. Pro BNP was 143.6. Coumadin on hold.  #11 history of dermatomyositis on chronic steroids Continue current regimen of prednisone, Plaquenil, methotrexate.  #12 prophylaxis PPI for GI prophylaxis. INR is supratherapeutic at 4.  Code Status: DO NOT RESUSCITATE Family Communication: Updated patient and husband at bedtime. Disposition Plan: Home with home health versus SNF pending physical therapy evaluation.   Consultants:  Interventional radiology pending  Procedures:  X-ray of the lumbar spine 02/18/2014  Antibiotics:  None  HPI/Subjective: Patient  states pain is somewhat improved since admission however has just been laying in bed and has not moved around. No other complaints.  Objective: Filed Vitals:   02/19/14 0913  BP: 131/63  Pulse: 77  Temp: 98.3 F (36.8 C)  Resp: 16    Intake/Output Summary (Last 24 hours) at 02/19/14 1108 Last data filed at 02/19/14 0813  Gross per 24 hour  Intake    960 ml  Output    650 ml  Net    310 ml   Filed Weights   02/18/14 1509  Weight: 117.935 kg (260 lb)    Exam:   General:  Obese, NAD  Cardiovascular: Regular rate rhythm no murmurs rubs or gallops. Distant heart sounds.  Respiratory: Clear to auscultation bilaterally. Distant breath sounds secondary to body habitus.  Abdomen: Soft, nontender, obese, positive bowel sounds, nondistended.  Musculoskeletal: No clubbing no cyanosis. Nonpitting bilateral lower extremity edema per patient chronic  Data Reviewed: Basic Metabolic Panel:  Recent Labs Lab 02/18/14 1331 02/19/14 0447  NA 140 137  K 4.1 4.8  CL 106 105  CO2 24 25  GLUCOSE 82 92  BUN 20 20  CREATININE 0.92 0.96  CALCIUM 8.4 8.4   Liver Function Tests:  Recent Labs Lab 02/18/14 1331  AST 42*  ALT 26  ALKPHOS 105  BILITOT 0.5  PROT 5.6*  ALBUMIN 2.4*   No results found for this basename: LIPASE, AMYLASE,  in the last 168 hours No results found for this basename: AMMONIA,  in the last 168 hours CBC:  Recent Labs Lab 02/18/14 1331 02/19/14 0447  WBC 6.5 4.9  NEUTROABS 4.2  --   HGB 12.1 11.4*  HCT 36.7 35.6*  MCV 97.3 99.4  PLT 110* 93*   Cardiac Enzymes: No results found for this basename: CKTOTAL, CKMB, CKMBINDEX, TROPONINI,  in the last 168 hours BNP (last  3 results)  Recent Labs  02/18/14 2014  PROBNP 143.6*   CBG: No results found for this basename: GLUCAP,  in the last 168 hours  No results found for this or any previous visit (from the past 240 hour(s)).   Studies: Dg Lumbar Spine Complete  02/18/2014   CLINICAL DATA:   Fall.  Low back pain.  EXAM: LUMBAR SPINE - COMPLETE 4+ VIEW  COMPARISON:  None.  FINDINGS: There is a mild levoconvex curve on the frontal view of the lumbar spine. Large stool burden is incidentally noted. Lumbar vertebral body height is preserved. Intervertebral disc spaces in the lumbar spine are within normal limits. Mild abdominal aortic atherosclerosis.  There is a T12 compression fracture with about 70% loss of anterior vertebral body height and retropulsion. Retropulsion appears to measure about 7 mm. No other compression fractures are identified.  IMPRESSION: 1. T12 compression fracture with 70% loss of anterior vertebral body height and retropulsion. Consider followup MRI for further assessment of the age and central canal compromise. 2. Normal appearance of the lumbar spine for age aside from a mild levoconvex curve.   Electronically Signed   By: Dereck Ligas M.D.   On: 02/18/2014 12:20    Scheduled Meds: . cyclobenzaprine  10 mg Oral BID  . hydroxychloroquine  200 mg Oral BID  . Influenza vac split quadrivalent PF  0.5 mL Intramuscular Tomorrow-1000  . levothyroxine  112 mcg Oral QAC breakfast  . lidocaine  1 patch Transdermal Q24H  . methotrexate  12.5 mg Oral Weekly  . metoprolol succinate  25 mg Oral Daily  . pantoprazole  40 mg Oral Daily  . polyethylene glycol  17 g Oral BID  . predniSONE  10 mg Oral Q breakfast  . senna-docusate  1 tablet Oral QHS   Continuous Infusions:   Principal Problem:   T12 compression fracture Active Problems:   HYPERTENSION, UNSPECIFIED   Atrial fibrillation   LEG EDEMA, BILATERAL   Morbid obesity   Chronic anticoagulation   Chronic steroid use   Unspecified constipation   Chronic diastolic heart failure   Dermatomyositis    Time spent: 35 mins    [facility]  MD [facility] Pager [phone]. If 7PM-7AM, please contact night-coverage at [email or web address], password [facility] 02/19/2014, 11:08 AM  LOS: 1 day

## 2014-02-19 NOTE — Plan of Care (Signed)
Problem: Phase I Progression Outcomes Goal: OOB as tolerated unless otherwise ordered Outcome: Progressing Patient fitted by [employer] with TLSO brace.

## 2014-02-19 NOTE — Progress Notes (Signed)
Clinical Social Work Department BRIEF PSYCHOSOCIAL ASSESSMENT 02/19/2014  Patient:  [name], [name]     Account Number:  [id number]     Admit date:  02/18/2014  Clinical Social Worker:  Lacie Scotts  Date/Time:  02/19/2014 12:02 PM  Referred by:  Physician  Date Referred:  02/19/2014 Referred for  SNF Placement   Other Referral:   Interview type:  Patient Other interview type:    PSYCHOSOCIAL DATA Living Status:  HUSBAND Admitted from facility:   Level of care:   Primary support name:  Fritz Pickerel Primary support relationship to patient:  SPOUSE Degree of support available:   supportive    CURRENT CONCERNS  Other Concerns:    SOCIAL WORK ASSESSMENT / PLAN Pt is a 68 yr old female living at home with spouse prior to hospitalization. CSW met with pt to assist with d/c planning. Pt hopes to return home following hospital d/c. PT eval is pending. Possible kyphoplasty is pending. Pt states her husband is home with her 24/7 but he was having difficulty assisting her due to increased pain. Pt will consider [facility] Rehab if needed. She has given CSW permission to initiate SNF search. Bed offers are pending .   Assessment/plan status:  Psychosocial Support/Ongoing Assessment of Needs Other assessment/ plan:   Information/referral to community resources:   None needed at this time.    PATIENT'S/FAMILY'S RESPONSE TO PLAN OF CARE: Pt states she has less pain than yesterday but she hasn't been moving. " If my pain is controlled then I think I can go back home. " D/C planning is ongoing.    Werner Lean LCSW [phone]

## 2014-02-19 NOTE — Plan of Care (Signed)
Problem: Phase II Progression Outcomes Goal: Obtain order to discontinue catheter if appropriate Outcome: Not Met (add Reason) Foley placed per MD order.

## 2014-02-19 NOTE — Plan of Care (Signed)
Problem: Phase I Progression Outcomes Goal: Pain controlled with appropriate interventions Outcome: Progressing Patient prefers not to use "strong narcotics". Pain tolerable with flexeril, oxyir, and tylenol.

## 2014-02-20 ENCOUNTER — Observation Stay (HOSPITAL_COMMUNITY): Payer: Medicare Other

## 2014-02-20 LAB — BASIC METABOLIC PANEL
Anion gap: 8 (ref 5–15)
BUN: 20 mg/dL (ref 6–23)
CO2: 25 mEq/L (ref 19–32)
Calcium: 8.2 mg/dL — ABNORMAL LOW (ref 8.4–10.5)
Chloride: 103 mEq/L (ref 96–112)
Creatinine, Ser: 0.95 mg/dL (ref 0.50–1.10)
GFR calc Af Amer: 70 mL/min — ABNORMAL LOW (ref >90)
GFR calc non Af Amer: 61 mL/min — ABNORMAL LOW (ref >90)
Glucose, Bld: 76 mg/dL (ref 70–99)
Potassium: 4.2 mEq/L (ref 3.7–5.3)
Sodium: 136 mEq/L — ABNORMAL LOW (ref 137–147)

## 2014-02-20 LAB — CBC
HCT: 34.3 % — ABNORMAL LOW (ref 36.0–46.0)
Hemoglobin: 11.2 g/dL — ABNORMAL LOW (ref 12.0–15.0)
MCH: 31.7 pg (ref 26.0–34.0)
MCHC: 32.7 g/dL (ref 30.0–36.0)
MCV: 97.2 fL (ref 78.0–100.0)
Platelets: 99 10*3/uL — ABNORMAL LOW (ref 150–400)
RBC: 3.53 MIL/uL — ABNORMAL LOW (ref 3.87–5.11)
RDW: 17.8 % — ABNORMAL HIGH (ref 11.5–15.5)
WBC: 5.3 10*3/uL (ref 4.0–10.5)

## 2014-02-20 LAB — TSH: TSH: 8.84 u[IU]/mL — ABNORMAL HIGH (ref 0.350–4.500)

## 2014-02-20 LAB — PROTIME-INR
INR: 2.93 — ABNORMAL HIGH (ref 0.00–1.49)
Prothrombin Time: 30.6 seconds — ABNORMAL HIGH (ref 11.6–15.2)

## 2014-02-20 MED ORDER — LEVOTHYROXINE SODIUM 125 MCG PO TABS
125.0000 ug | ORAL_TABLET | Freq: Every day | ORAL | Status: DC
  Administered 2014-02-21 – 2014-03-01 (×9): 125 ug via ORAL
  Filled 2014-02-20 (×13): qty 1

## 2014-02-20 MED ORDER — MAGNESIUM CITRATE PO SOLN
1.0000 | Freq: Once | ORAL | Status: AC
  Administered 2014-02-20: 1 via ORAL

## 2014-02-20 MED ORDER — LEVOTHYROXINE SODIUM 25 MCG PO TABS
12.5000 ug | ORAL_TABLET | Freq: Once | ORAL | Status: AC
  Administered 2014-02-20: 12.5 ug via ORAL
  Filled 2014-02-20: qty 0.5

## 2014-02-20 NOTE — Progress Notes (Signed)
[place] for Heparin when INR < 2 Indication: atrial fibrillation  Allergies  Allergen Reactions  . Butoconazole     'Redness and swelling feverish ' 'worse symptoms '  . Lipitor [Atorvastatin Calcium] Other (See Comments)    'Muscle weakness'      Patient Measurements: Height: 5' (152.4 cm) Weight: 260 lb (117.935 kg) IBW/kg (Calculated) : 45.5 Heparin Dosing Weight: 75 kg  Vital Signs: Temp: 98.4 F (36.9 C) (09/10 0525) Temp src: Oral (09/10 0525) BP: 112/51 mmHg (09/10 0525) Pulse Rate: 74 (09/10 0525)  Labs:  Recent Labs  02/18/14 1331 02/19/14 0447 02/20/14 0535  HGB 12.1 11.4* 11.2*  HCT 36.7 35.6* 34.3*  PLT 110* 93* 99*  LABPROT 40.1* 39.1* 30.6*  INR 4.15* 4.01* 2.93*  CREATININE 0.92 0.96 0.95    Estimated Creatinine Clearance: 67.6 ml/min (by C-G formula based on Cr of 0.95).   Assessment: 68 yo female on chronic warfarin therapy for afib. Admitted 9/8 with back pain after an injury 1 week ago. Pharmacy is asked to dose IV heparin as warfarin will be held in anticipation of surgery.  Home warfarin dose 2.5mg  MWFSS, 5mg  TTh, last taken 9/7  Today: 9/10  INR today = 2.93  Hgb slightly low, Plts low at 99k  (no baseline to compare)  Goal of Therapy:  Heparin level 0.3-0.7 units/ml Monitor platelets by anticoagulation protocol: Yes   Plan:   Continue to hold warfarin with plans to transition to heparin gtt when INR < 2 for possible kyphoplasty secondary to compression fracture  IR to check CT today (unable to do MRI d/t body habitus) to evaluate appropriateness of vertobroplasty or kyphoplasty  No heparin until INR < 2.0  Check INR daily  Monitor CBC - platelets  Doreene Eland, PharmD, BCPS.   Pager: [phone] 02/20/2014,11:14 AM

## 2014-02-20 NOTE — Progress Notes (Signed)
Utilization review completed.  

## 2014-02-20 NOTE — Progress Notes (Addendum)
Patient ID: [name], female   DOB: April 09, 1946, 68 y.o.   MRN: [id number] Pt unable to get MRI due to body habitus; will arrange for CT from T10-S1 for further assessment and make recommendations regarding VP/KP once imaging results finalized. PT/INR today 30.6/2.93- levels will need to be normalized prior to potential VP/KP (? need for FFP/vit K).

## 2014-02-20 NOTE — Progress Notes (Signed)
Patient ID: [name], female   DOB: May 31, 1946, 68 y.o.   MRN: [id number] CT has been reviewed by Dr. Estanislado Pandy and pt is candidate for T12 VP. Awaiting insurance approval. Once approved and PT/INR within nl range can proceed. If procedure cannot be done 9/11 then will performed next week if pt desires.

## 2014-02-20 NOTE — Progress Notes (Signed)
[facility] PROGRESS NOTE  [name] QHU:[id number] DOB: 03-16-1946 DOA: 02/18/2014 PCP: [name] Medal, MD  Assessment/Plan: #1 T12 compression fracture Likely mechanical in nature. Patient states pain has improved since admission, however has not moved around. Per admitting physician, Dr. Maryland [name], neurosurgery was curbside and recommended brace with conservative treatment. Interventional radiology assessing for possible kyphoplasty. MRI ordered but unable to be done secondary to body habitus. CT pending per IR for further review. Continue current pain regimen with Lidoderm patch and when necessary oxycodone as needed for breakthrough pain.  #2 hypothyroidism TSH elevated at 8.840. Increase Synthroid to 125 mcg daily.  #3 atrial fibrillation Continue metoprolol for rate control.  INR therapeutic. Continue to hold coumadin in case of kyphoplasty.  #4 hypertension Stable. Continue metoprolol.  #5 constipation Continue MiraLAX twice daily and Senokot at bedtime. Magnesium citrate. If no results, will try an enema.   #6 chronic diastolic heart failure Stable. Pro BNP was 143.6. Continue metoprolol. Follow.  #7 thrombocytopenia Questionable etiology. No active bleeding. Will monitor for now.  #8 morbid obesity  #9 bilateral lower extremity edema Likely secondary to chronic venous stasis secondary to morbid obesity in the setting of steroids. Patient states this has been chronic. Pro BNP was 143.6. Follow.  #10 coagulopathy INR of 2.93. Pro BNP was 143.6. Coumadin on hold.  #11 history of dermatomyositis on chronic steroids Continue current regimen of prednisone, Plaquenil, methotrexate.  #12 prophylaxis PPI for GI prophylaxis. INR is therapeutic at 2.93.   Code Status: DO NOT RESUSCITATE Family Communication: Updated patient and husband at bedtime. Disposition Plan: Home with home health versus SNF pending physical therapy evaluation.   Consultants:  Interventional  radiology   Procedures:  X-ray of the lumbar spine 02/18/2014    Antibiotics:  None  HPI/Subjective: Patient states pain is somewhat improved since admission. Patient c/o constipation. No other complaints.  Objective: Filed Vitals:   02/20/14 0525  BP: 112/51  Pulse: 74  Temp: 98.4 F (36.9 C)  Resp: 16    Intake/Output Summary (Last 24 hours) at 02/20/14 0815 Last data filed at 02/20/14 0525  Gross per 24 hour  Intake      0 ml  Output   1850 ml  Net  -1850 ml   Filed Weights   02/18/14 1509  Weight: 117.935 kg (260 lb)    Exam:   General:  Obese, NAD  Cardiovascular: Regular rate rhythm no murmurs rubs or gallops. Distant heart sounds.  Respiratory: Clear to auscultation bilaterally. Distant breath sounds secondary to body habitus.  Abdomen: Soft, nontender, obese, positive bowel sounds, nondistended.  Musculoskeletal: No clubbing no cyanosis. Nonpitting bilateral lower extremity edema per patient chronic  Data Reviewed: Basic Metabolic Panel:  Recent Labs Lab 02/18/14 1331 02/19/14 0447 02/20/14 0535  NA 140 137 136*  K 4.1 4.8 4.2  CL 106 105 103  CO2 24 25 25   GLUCOSE 82 92 76  BUN 20 20 20   CREATININE 0.92 0.96 0.95  CALCIUM 8.4 8.4 8.2*   Liver Function Tests:  Recent Labs Lab 02/18/14 1331  AST 42*  ALT 26  ALKPHOS 105  BILITOT 0.5  PROT 5.6*  ALBUMIN 2.4*   No results found for this basename: LIPASE, AMYLASE,  in the last 168 hours No results found for this basename: AMMONIA,  in the last 168 hours CBC:  Recent Labs Lab 02/18/14 1331 02/19/14 0447 02/20/14 0535  WBC 6.5 4.9 5.3  NEUTROABS 4.2  --   --   HGB 12.1  11.4* 11.2*  HCT 36.7 35.6* 34.3*  MCV 97.3 99.4 97.2  PLT 110* 93* 99*   Cardiac Enzymes: No results found for this basename: CKTOTAL, CKMB, CKMBINDEX, TROPONINI,  in the last 168 hours BNP (last 3 results)  Recent Labs  02/18/14 2014  PROBNP 143.6*   CBG: No results found for this basename:  GLUCAP,  in the last 168 hours  No results found for this or any previous visit (from the past 240 hour(s)).   Studies: Dg Lumbar Spine Complete  02/18/2014   CLINICAL DATA:  Fall.  Low back pain.  EXAM: LUMBAR SPINE - COMPLETE 4+ VIEW  COMPARISON:  None.  FINDINGS: There is a mild levoconvex curve on the frontal view of the lumbar spine. Large stool burden is incidentally noted. Lumbar vertebral body height is preserved. Intervertebral disc spaces in the lumbar spine are within normal limits. Mild abdominal aortic atherosclerosis.  There is a T12 compression fracture with about 70% loss of anterior vertebral body height and retropulsion. Retropulsion appears to measure about 7 mm. No other compression fractures are identified.  IMPRESSION: 1. T12 compression fracture with 70% loss of anterior vertebral body height and retropulsion. Consider followup MRI for further assessment of the age and central canal compromise. 2. Normal appearance of the lumbar spine for age aside from a mild levoconvex curve.   Electronically Signed   By: [name] M.D.   On: 02/18/2014 12:20   Mr Attempted [name] Report  02/19/2014   This examination belongs to an outside facility and is stored  here for comparison purposes only.  Contact the originating outside  institution for any associated report or interpretation.   Scheduled Meds: . cyclobenzaprine  10 mg Oral BID  . hydroxychloroquine  200 mg Oral BID  . levothyroxine  112 mcg Oral QAC breakfast  . lidocaine  1 patch Transdermal Q24H  . methotrexate  12.5 mg Oral Weekly  . metoprolol succinate  25 mg Oral Daily  . pantoprazole  40 mg Oral Daily  . polyethylene glycol  17 g Oral BID  . predniSONE  10 mg Oral Q breakfast  . senna-docusate  1 tablet Oral QHS   Continuous Infusions:   Principal Problem:   T12 compression fracture Active Problems:   HYPERTENSION, UNSPECIFIED   Atrial fibrillation   LEG EDEMA, BILATERAL   Morbid obesity   Chronic  anticoagulation   Chronic steroid use   Unspecified constipation   Chronic diastolic heart failure   Dermatomyositis    Time spent: 35 mins    [facility]  MD [facility] Pager [phone]. If 7PM-7AM, please contact night-coverage at [email or web address], password [name] 02/20/2014, 8:15 AM  LOS: 2 days

## 2014-02-21 DIAGNOSIS — G9520 Unspecified cord compression: Secondary | ICD-10-CM | POA: Diagnosis present

## 2014-02-21 HISTORY — DX: Unspecified cord compression: G95.20

## 2014-02-21 LAB — BASIC METABOLIC PANEL
Anion gap: 8 (ref 5–15)
BUN: 17 mg/dL (ref 6–23)
CO2: 26 mEq/L (ref 19–32)
Calcium: 8.1 mg/dL — ABNORMAL LOW (ref 8.4–10.5)
Chloride: 105 mEq/L (ref 96–112)
Creatinine, Ser: 0.9 mg/dL (ref 0.50–1.10)
GFR calc Af Amer: 75 mL/min — ABNORMAL LOW (ref >90)
GFR calc non Af Amer: 65 mL/min — ABNORMAL LOW (ref >90)
Glucose, Bld: 83 mg/dL (ref 70–99)
Potassium: 4.2 mEq/L (ref 3.7–5.3)
Sodium: 139 mEq/L (ref 137–147)

## 2014-02-21 LAB — CBC
HCT: 35.9 % — ABNORMAL LOW (ref 36.0–46.0)
Hemoglobin: 11.5 g/dL — ABNORMAL LOW (ref 12.0–15.0)
MCH: 31.9 pg (ref 26.0–34.0)
MCHC: 32 g/dL (ref 30.0–36.0)
MCV: 99.4 fL (ref 78.0–100.0)
Platelets: 100 10*3/uL — ABNORMAL LOW (ref 150–400)
RBC: 3.61 MIL/uL — ABNORMAL LOW (ref 3.87–5.11)
RDW: 17.7 % — ABNORMAL HIGH (ref 11.5–15.5)
WBC: 5 10*3/uL (ref 4.0–10.5)

## 2014-02-21 LAB — PROTIME-INR
INR: 2.09 — ABNORMAL HIGH (ref 0.00–1.49)
Prothrombin Time: 23.5 seconds — ABNORMAL HIGH (ref 11.6–15.2)

## 2014-02-21 MED ORDER — KETOROLAC TROMETHAMINE 30 MG/ML IJ SOLN
30.0000 mg | Freq: Four times a day (QID) | INTRAMUSCULAR | Status: AC | PRN
  Filled 2014-02-21: qty 1

## 2014-02-21 MED ORDER — HEPARIN (PORCINE) IN NACL 100-0.45 UNIT/ML-% IJ SOLN
1150.0000 [IU]/h | INTRAMUSCULAR | Status: DC
  Administered 2014-02-22 – 2014-02-24 (×3): 1150 [IU]/h via INTRAVENOUS
  Filled 2014-02-21 (×5): qty 250

## 2014-02-21 NOTE — Progress Notes (Signed)
[place] for Heparin when INR < 2 Indication: atrial fibrillation  Allergies  Allergen Reactions  . Butoconazole     'Redness and swelling feverish ' 'worse symptoms '  . Lipitor [Atorvastatin Calcium] Other (See Comments)    'Muscle weakness'      Patient Measurements: Height: 5' (152.4 cm) Weight: 260 lb (117.935 kg) IBW/kg (Calculated) : 45.5 Heparin Dosing Weight: 75 kg  Vital Signs: Temp: 97.9 F (36.6 C) (09/11 0603) Temp src: Oral (09/11 0603) BP: 134/50 mmHg (09/11 0603) Pulse Rate: 72 (09/11 0603)  Labs:  Recent Labs  02/19/14 0447 02/20/14 0535 02/21/14 0432  HGB 11.4* 11.2* 11.5*  HCT 35.6* 34.3* 35.9*  PLT 93* 99* 100*  LABPROT 39.1* 30.6* 23.5*  INR 4.01* 2.93* 2.09*  CREATININE 0.96 0.95 0.90    Estimated Creatinine Clearance: 71.3 ml/min (by C-G formula based on Cr of 0.9).   Assessment: 68 yo female on chronic warfarin therapy for afib. Admitted 9/8 with back pain after an injury 1 week ago. Pharmacy is asked to dose IV heparin as warfarin will be held in anticipation of surgery.  Home warfarin dose 2.5mg  MWFSS, 5mg  TTh, last taken 9/7  Today: 9/11  INR today = 2.09  Hgb slightly low/stable, Plts low at 100k- stable last 2 days  (no baseline to compare)  Goal of Therapy:  Heparin level 0.3-0.7 units/ml Monitor platelets by anticoagulation protocol: Yes   Plan:   Expect INR to be < 2 this afternoon, will start heparin without bolus.  Heparin 1150 units/hr  IR to perform vertebroplasty Monday at [facility]  Check 6h heparin level   Daily heparin level and CBC (watch platelet count)  Check INR daily for now as needed to perform procedure  Doreene Eland, PharmD, BCPS.   Pager: [phone] 02/21/2014,1:20 PM

## 2014-02-21 NOTE — Progress Notes (Signed)
I reviewed the CT scan dated 02/20/14. This demonstrates a burst fracture of T12 involving the body and posterior cortex. There is no apparent fracture of the posterior elements by my review. This fracture does not appear unstable. While there is bony retropulsion into the canal, the patient is reportedly neurologically intact and therefore does not require decompression.

## 2014-02-21 NOTE — Consult Note (Signed)
Chief Complaint: back pain   Referring Physician(s): Dr. Irine Seal  History of Present Illness: [name] is a 68 y.o. female with past medical history of atrial fibrillation on chronic Coumadin, chronic diastolic heart failure and chronic steroid use secondary to dermatomyositis who one week prior to admission was getting out of her husband's elevated truck which was approximately 2 feet from the ground landing on both feet. Immediately upon landing, she felt something pop in her lower back and immediately began having significant pain. The pain was described as right in the middle of her lower back, radiating left and right, but not to LE's. She's had no associated radicular symptoms, bowel or bladder difficulties..The pain is worsened with any subtle movement.  MRI was unable to be performed secondary to pt's body habitus. Subsequent CT of lower thoracic/lumbar spine revealed T12 burst fracture with 7 mm retropulsion of bone into canal causing 50% diameter stenosis of the canal. Per neurosurgery the  fracture does not appear unstable or require decompression.   Past Medical History  Diagnosis Date  . Atrial fibrillation 09/20/2008  . HYPERTENSION, UNSPECIFIED 09/20/2008  . LEG EDEMA, BILATERAL 09/20/2008  . Internal hemorrhoids   . GERD (gastroesophageal reflux disease)   . Hiatal hernia   . Dermatomyositis   . Morbid obesity   . Osteoarthritis   . Hypothyroidism   . Sleep apnea   . History of pancreatitis   . Chronic diastolic heart failure   . Shingles   . Gall stones   . Gout   . Deviated septum   . Hyperlipidemia   . Obesity   . Continuous urine leakage   . CAD (coronary artery disease)     a. LHC 03/2006: EF 65%, mid LAD 40-50%, ostial D1 70-80%, normal circumflex, normal RCA;  b. Lexiscan Myoview 09/2010: No ischemia or scar, EF 75%;  c. Lex MV 5/14:  Normal, no ischemia, EF 71%  . Hx of echocardiogram     Echo 8/14:  Mild LVH, EF 55-65%, normal wall motion, Tr  AI, mild MR, mod TR, PASP 45  . History of PFTs     PFTs 10/2012: normal.     Past Surgical History  Procedure Laterality Date  . Total abdominal hysterectomy    . Nasal septum surgery      Allergies: Butoconazole and Lipitor  Medications: Prior to Admission medications   Medication Sig Start Date End Date Taking? Authorizing Provider  acetaminophen (TYLENOL) 500 MG tablet Take 1,000 mg by mouth every 6 (six) hours as needed for mild pain.   Yes Historical Provider, MD  Cholecalciferol (VITAMIN D3) 2000 UNITS TABS Take 2,000 Units by mouth daily.    Yes Historical Provider, MD  cyclobenzaprine (FLEXERIL) 10 MG tablet Take 10 mg by mouth 2 (two) times daily.   Yes Historical Provider, MD  furosemide (LASIX) 40 MG tablet Take 40 mg by mouth as needed for fluid.  08/26/13  Yes Scott Joylene Draft, PA-C  hydrocortisone (ANUSOL-HC) 2.5 % rectal cream Place rectally 2 (two) times daily.   Yes Historical Provider, MD  hydroxychloroquine (PLAQUENIL) 200 MG tablet Take 200 mg by mouth 2 (two) times daily.  08/06/10  Yes Historical Provider, MD  levothyroxine (SYNTHROID, LEVOTHROID) 112 MCG tablet Take 112 mcg by mouth daily before breakfast.   Yes Historical Provider, MD  methotrexate (RHEUMATREX) 2.5 MG tablet Take 12.5 mg by mouth once a week.  08/06/10  Yes Historical Provider, MD  metoprolol succinate (TOPROL-XL) 25 MG  24 hr tablet Take 1 tablet by mouth  daily   Yes [name], MD  Multiple Vitamin (MULTIVITAMIN) tablet Take 1 tablet by mouth daily.     Yes Historical Provider, MD  nitroGLYCERIN (NITROSTAT) 0.4 MG SL tablet Place 1 tablet (0.4 mg total) under the tongue every 5 (five) minutes as needed. 08/26/13  Yes Scott T Kathlen Mody, PA-C  pantoprazole (PROTONIX) 40 MG tablet Take 40 mg by mouth daily. 08/09/10  Yes Historical Provider, MD  potassium chloride SA (K-DUR,KLOR-CON) 20 MEQ tablet Take 1 tablet by mouth two  times daily   Yes [name], MD  predniSONE (DELTASONE) 1 MG tablet Take 5 mg  by mouth daily.  08/17/10  Yes Historical Provider, MD  traMADol (ULTRAM) 50 MG tablet Take 50 mg by mouth every 6 (six) hours as needed for moderate pain.  08/29/12  Yes Historical Provider, MD  warfarin (COUMADIN) 5 MG tablet Take 2.5-5 mg by mouth daily at 6 PM. 5mg  on Tuesday and Thursday . 2.5 mg Su,Mo,We,Fr,Sa 07/11/11  Yes Renella Cunas, MD    Family History  Problem Relation Age of Onset  . Heart attack Brother     x 2  . Coronary artery disease Father   . Colon cancer Paternal Uncle   . Kidney disease Brother     History   Social History  . Marital Status: Married    Spouse Name: N/A    Number of Children: 2  . Years of Education: N/A   Occupational History  .     Social History Main Topics  . Smoking status: Never Smoker   . Smokeless tobacco: Never Used  . Alcohol Use: No  . Drug Use: No  . Sexual Activity: None   Other Topics Concern  . None   Social History Narrative  . None         Review of Systems   Constitutional: Negative for fever and chills.  Respiratory: Negative for cough and shortness of breath.   Cardiovascular: Positive for leg swelling. Negative for chest pain.  Gastrointestinal: Negative for nausea, vomiting, abdominal pain and blood in stool.  Genitourinary: Negative for hematuria.  Musculoskeletal: Positive for back pain.  Neurological: Negative for syncope, numbness and headaches.  Hematological: Does not bruise/bleed easily.    Vital Signs: BP 130/78  Pulse 74  Temp(Src) 98.6 F (37 C) (Oral)  Resp 16  Ht 5' (1.524 m)  Wt 260 lb (117.935 kg)  BMI 50.78 kg/m2  SpO2 97%  Physical Exam  Constitutional: She is oriented to person, place, and time. She appears well-developed and well-nourished.  Cardiovascular: Normal rate.   occ ectopy noted  Pulmonary/Chest: Effort normal.  Distant BS bilat  Abdominal: Soft. Bowel sounds are normal. There is no tenderness.  morbidly obese  Musculoskeletal: Normal range of motion. She  exhibits no edema.  Strength/sens intact all four ext  Neurological: She is alert and oriented to person, place, and time.    Imaging: Dg Lumbar Spine Complete  02/18/2014   CLINICAL DATA:  Fall.  Low back pain.  EXAM: LUMBAR SPINE - COMPLETE 4+ VIEW  COMPARISON:  None.  FINDINGS: There is a mild levoconvex curve on the frontal view of the lumbar spine. Large stool burden is incidentally noted. Lumbar vertebral body height is preserved. Intervertebral disc spaces in the lumbar spine are within normal limits. Mild abdominal aortic atherosclerosis.  There is a T12 compression fracture with about 70% loss of anterior vertebral body height and retropulsion.  Retropulsion appears to measure about 7 mm. No other compression fractures are identified.  IMPRESSION: 1. T12 compression fracture with 70% loss of anterior vertebral body height and retropulsion. Consider followup MRI for further assessment of the age and central canal compromise. 2. Normal appearance of the lumbar spine for age aside from a mild levoconvex curve.   Electronically Signed   By: Dereck Ligas M.D.   On: 02/18/2014 12:20   Ct Lumbar Spine Wo Contrast  02/20/2014   CLINICAL DATA:  Fall.  Evaluate T12 fracture  EXAM: CT LUMBAR SPINE WITHOUT CONTRAST  TECHNIQUE: Multidetector CT imaging of the lumbar spine was performed without intravenous contrast administration. Multiplanar CT image reconstructions were also generated.  COMPARISON:  Lumbar radiographs 02/18/2014  FINDINGS: Burst fracture T12 involving all 3 columns. Retropulsion of bone into the canal by approximately 7 mm. Minimal kyphosis at the fracture level. This appears to be a benign fracture.  No other fracture is identified.  No focal bony lesion.  Image quality limited due to morbid obesity. Lower lumbar spine poorly visualized due to patient size.  IMPRESSION: Burst fracture T12. 7 mm retropulsion of bone into the canal causing 50% diameter stenosis of the canal. This appears to  be a benign fracture.  Bony detail limited due to patient size.   Electronically Signed   By: Franchot Gallo M.D.   On: 02/20/2014 15:03   Mr Attempted [name] Report  02/19/2014   This examination belongs to an outside facility and is stored  here for comparison purposes only.  Contact the originating outside  institution for any associated report or interpretation.   Labs: Lab Results  Component Value Date   WBC 5.0 02/21/2014   HCT 35.9* 02/21/2014   MCV 99.4 02/21/2014   PLT 100* 02/21/2014   NA 139 02/21/2014   K 4.2 02/21/2014   CL 105 02/21/2014   CO2 26 02/21/2014   GLUCOSE 83 02/21/2014   BUN 17 02/21/2014   CREATININE 0.90 02/21/2014   CALCIUM 8.1* 02/21/2014   PROT 5.6* 02/18/2014   ALBUMIN 2.4* 02/18/2014   AST 42* 02/18/2014   ALT 26 02/18/2014   ALKPHOS 105 02/18/2014   BILITOT 0.5 02/18/2014   GFRNONAA 65* 02/21/2014   GFRAA 75* 02/21/2014   INR 2.09* 02/21/2014    Assessment and Plan: Pt with history of symptomatic T12 compression/burst fracture following recent fall. Also previously on coumadin for PAF, awaiting transition to IV heparin in anticipation of VP. PT/INR today 23.5/2.09. Imaging studies have been reviewed again by Dr. Vernard Gambles. Tent plan is for T12 VP on 9/15 at 1000 at [facility]. Details/risks of procedure d/w pt/husband with their understanding and consent. Will issue additional preprocedure orders closer to date of VP.  Rowe Robert, PAC

## 2014-02-21 NOTE — Progress Notes (Signed)
CSW assisting with d/c planning. PN reviewed . Pt is not stable for d/c. CSW met with pt / spouse / daughter to assist with d/c planning. Pt hopes she can return home following hospital d/c but will consider ST Rehab if pain has not improved. SNF bed offers have been provided and pt/ family are reviewing choices. CSW will continue to follow to assist with d/c planning needs.    LCSW [phone] 

## 2014-02-21 NOTE — Progress Notes (Addendum)
Patient ID: [name], female   DOB: 1946-05-06, 68 y.o.   MRN: [id number] Pt's PT/INR today is 23.5/2.09 and still too elevated to do VP per IR attending. Pt is symptomatic with back pain elicited with any significant movement.  Earliest procedure can be done is 9/15 at [facility]. Insurance checked and ok. Details/risks of T12 VP discussed with pt/husband with their understanding and consent. Will continue to monitor status and issue preprocedure orders closer to date of VP. Recommend continued IP care prior to VP secondary to nature of burst fracture with 50% diameter canal stenosis and need for close monitoring of PT/INR while transitioning from coumadin to IV heparin for afib. It is much easier to coordinate/regulate procedure timing while pt is on IV heparin than to d/c home again on coumadin only to stop for 3-4 days before VP and delay start any further. Above d/w pt/Dr. Grandville Silos.

## 2014-02-21 NOTE — Progress Notes (Signed)
Physical Therapy Treatment Patient Details Name: [name] MRN: [id number] DOB: Feb 11, 1946 Today's Date: 02/21/2014    History of Present Illness Past medical history of atrial fibrillation on chronic Coumadin, chronic diastolic heart failure and chronic steroid use secondary to dermatomyositis who one week prior was getting out of her husband's elevated truck which was approximately 2 feet from the ground landing on both feet. Immediately upon landing, she felt something pop in her lower back she started having significant pain. Pain was described as right in the middle of her lower back radiating left and right, X-rays done noted in acute T12 compression fracture with 80% loss. Planned KP 02/25/14    PT Comments    Instructions for exercises  Provided. Per RN, should be very carefu with mobility and may consider to defer OOB until after  KP.  Follow Up Recommendations  SNF;Home health PT (will see how she does post procedure.)     Equipment Recommendations  Rolling walker with 5" wheels    Recommendations for Other Services       Precautions / Restrictions Precautions Precautions: Back Precaution Comments: per Dr. Grandville Silos, bed to chair transfers only if tolerated.    Mobility  Bed Mobility           Transfers                 General transfer comment: patient does not  feel she can sit up.  Ambulation/Gait                 Stairs            Wheelchair Mobility    Modified Rankin (Stroke Patients Only)       Balance                                    Cognition Arousal/Alertness: Awake/alert Behavior During Therapy: WFL for tasks assessed/performed Overall Cognitive Status: Within Functional Limits for tasks assessed                      Exercises General Exercises - Lower Extremity Ankle Circles/Pumps: AROM;Both;10 reps Quad Sets: AROM;Both;10 reps Heel Slides: AROM;Both (to try later.) Other Exercises Other  Exercises: Light theraband exercise  for elbow extension, elbow flexion, horizontal abduction x 10, as long as  pain not innitiated.    General Comments        Pertinent Vitals/Pain Pain Assessment: 0-10 Pain Score: 3  Pain Location: back only when moving. Pain Descriptors / Indicators: Aching Pain Intervention(s): Limited activity within patient's tolerance;Monitored during session;Repositioned    Home Living Family/patient expects to be discharged to:: Private residence Living Arrangements: Spouse/significant other;Other relatives Available Help at Discharge: Family         Home Equipment: Walker - standard;Cane - single point;Walker - 4 wheels      Prior Function Level of Independence: Independent      Comments: until back injury, started using cane, then very difficult time to  walk  at admission   PT Goals (current goals can now be found in the care plan section) Acute Rehab PT Goals Patient Stated Goal: I want to get up if I can PT Goal Formulation: With patient/family Time For Goal Achievement: 02/28/14 Potential to Achieve Goals: Good Progress towards PT goals: Progressing toward goals    Frequency  Min 3X/week    PT Plan Current plan remains appropriate  Co-evaluation             End of Session   Activity Tolerance: Patient tolerated treatment well Patient left: in bed;with call bell/phone within reach;with family/visitor present     Time: 1330-1350 PT Time Calculation (min): 20 min  Charges:  $Therapeutic Exercise: 8-22 mins $Therapeutic Activity: 8-22 mins                    G Codes:  Functional Assessment Tool Used: clincal judgement Functional Limitation: Mobility: Walking and moving around Mobility: Walking and Moving Around Current Status (H5456): At least 80 percent but less than 100 percent impaired, limited or restricted Mobility: Walking and Moving Around Goal Status [phone]): At least 20 percent but less than 40 percent impaired,  limited or restricted   Claretha Cooper 02/21/2014, 1:59 PM Tresa Endo PT [phone]

## 2014-02-21 NOTE — Progress Notes (Signed)
OT Cancellation Note  Patient Details Name: ANGLES TREVIZO MRN: [id number] DOB: 05-26-1946   Cancelled Treatment:    Reason Eval/Treat Not Completed: Other (comment) Note plan for VP possibly early next week. Note order for bed to chair only. Will check back early in week for status. PT planning to see for bed to chair transfers today.  Jules Schick [phone] 02/21/2014, 10:25 AM

## 2014-02-21 NOTE — Progress Notes (Signed)
Clinical Social Work Department CLINICAL SOCIAL WORK PLACEMENT NOTE 02/21/2014  Patient:  [name], [name]  Account Number:  [id number] Admit date:  02/18/2014  Clinical Social Worker:  Werner Lean, LCSW  Date/time:  02/21/2014 12:14 PM  Clinical Social Work is seeking post-discharge placement for this patient at the following level of care:   SKILLED NURSING   (*CSW will update this form in Epic as items are completed)   02/21/2014  Patient/family provided with [facility] Department of Clinical Social Work's list of facilities offering this level of care within the geographic area requested by the patient (or if unable, by the patient's family).  02/21/2014  Patient/family informed of their freedom to choose among providers that offer the needed level of care, that participate in Medicare, Medicaid or managed care program needed by the patient, have an available bed and are willing to accept the patient.    Patient/family informed of [facility]' ownership interest in [facility], as well as of the fact that they are under no obligation to receive care at this facility.  PASARR submitted to EDS on 02/20/2014 PASARR number received on 02/20/2014  FL2 transmitted to all facilities in geographic area requested by pt/family on  02/20/2014 FL2 transmitted to all facilities within larger geographic area on   Patient informed that his/her managed care company has contracts with or will negotiate with  certain facilities, including the following:     Patient/family informed of bed offers received:  02/20/2014 Patient chooses bed at  Physician recommends and patient chooses bed at    Patient to be transferred to  on   Patient to be transferred to facility by  Patient and family notified of transfer on  Name of family member notified:    The following physician request were entered in Epic:   Additional Comments:  Werner Lean LCSW [phone]

## 2014-02-21 NOTE — Evaluation (Signed)
Physical Therapy Evaluation Patient Details Name: [name] MRN: [id number] DOB: 03/15/1946 Today's Date: 02/21/2014   History of Present Illness  Past medical history of atrial fibrillation on chronic Coumadin, chronic diastolic heart failure and chronic steroid use secondary to dermatomyositis who one week prior was getting out of her husband's elevated truck which was approximately 2 feet from the ground landing on both feet. Immediately upon landing, she felt something pop in her lower back she started having significant pain. Pain was described as right in the middle of her lower back radiating left and right, X-rays done noted in acute T12 compression fracture with 80% loss. Planned KP 02/25/14  Clinical Impression  Patient and family and CNA state that attempt to get  To sitting  yesterday was unsuccessful due to pain. Patient does not feel that she can try today. Will provide bed exercises  And check back 9/12 to see if patient wants to try  Sitting up.  Patient will benefit from PT to address problems listed in note. Plans for Kyphoplasty 9/15.  Follow Up Recommendations SNF;Home health PT (will see how she does post procedure.)    Equipment Recommendations   (tbd)    Recommendations for Other Services       Precautions / Restrictions Precautions Precautions: Back Precaution Comments: per Dr. Grandville Silos, bed to chair transfers only if tolerated.      Mobility  Bed Mobility Overal bed mobility: Needs Assistance;+2 for physical assistance;+ 2 for safety/equipment Bed Mobility: Rolling Rolling: Mod assist         General bed mobility comments: pt uses rails to roll, able to flex hips and use legs for rolling.  Transfers                 General transfer comment: patient does not  feel she can sit up.  Ambulation/Gait                Stairs            Wheelchair Mobility    Modified Rankin (Stroke Patients Only)       Balance                                             Pertinent Vitals/Pain Pain Assessment: 0-10 Pain Score: 7  Pain Location: back when rolling in bed. Pain Descriptors / Indicators: Constant;Aching;Sharp Pain Intervention(s): Limited activity within patient's tolerance;Monitored during session;Repositioned    Home Living Family/patient expects to be discharged to:: Private residence Living Arrangements: Spouse/significant other;Other relatives Available Help at Discharge: Family           Home Equipment: Walker - standard;Cane - single point;Walker - 4 wheels      Prior Function Level of Independence: Independent         Comments: until back injury, started using cane, then very difficult time to  walk  at admission     Hand Dominance        Extremity/Trunk Assessment   Upper Extremity Assessment: Overall WFL for tasks assessed           Lower Extremity Assessment: Generalized weakness         Communication   Communication: No difficulties  Cognition Arousal/Alertness: Awake/alert Behavior During Therapy: WFL for tasks assessed/performed Overall Cognitive Status: Within Functional Limits for tasks assessed  General Comments      Exercises        Assessment/Plan    PT Assessment Patient needs continued PT services  PT Diagnosis Acute pain   PT Problem List Decreased strength;Decreased activity tolerance;Decreased mobility;Pain  PT Treatment Interventions Functional mobility training;Therapeutic activities;Therapeutic exercise;Patient/family education   PT Goals (Current goals can be found in the Care Plan section) Acute Rehab PT Goals Patient Stated Goal: I want to get up if I can PT Goal Formulation: With patient/family Time For Goal Achievement: 02/28/14 Potential to Achieve Goals: Good    Frequency Min 3X/week   Barriers to discharge        Co-evaluation               End of Session   Activity  Tolerance: Patient tolerated treatment well Patient left: in bed;with call bell/phone within reach;with family/visitor present Nurse Communication: Mobility status    Functional Assessment Tool Used: clincal judgement Functional Limitation: Mobility: Walking and moving around Mobility: Walking and Moving Around Current Status (C0034): At least 80 percent but less than 100 percent impaired, limited or restricted Mobility: Walking and Moving Around Goal Status [phone]): At least 20 percent but less than 40 percent impaired, limited or restricted    Time: 1016-1035 PT Time Calculation (min): 19 min   Charges:   PT Evaluation $Initial PT Evaluation Tier I: 1 Procedure PT Treatments $Therapeutic Activity: 8-22 mins   PT G Codes:   Functional Assessment Tool Used: clincal judgement Functional Limitation: Mobility: Walking and moving around    Brice 02/21/2014, 12:53 PM Tresa Endo PT [phone]

## 2014-02-21 NOTE — Progress Notes (Signed)
[facility] PROGRESS NOTE  [name] NUU:[id number] DOB: 22-Jul-1945 DOA: 02/18/2014 PCP: Tommy Medal, MD  Assessment/Plan: #1 T12 compression fracture Likely mechanical in nature. Patient states pain has improved since admission, however has not moved around. Per admitting physician, Dr. Maryland Pink, neurosurgery was curbside and recommended brace with conservative treatment. Interventional radiology assessing for possible kyphoplasty. MRI ordered but unable to be done secondary to body habitus. CT of the lumbar spine which was done shows a burst fracture with 50% diameter canal stenosis. Patient has been seen by interventional radiology and patient for probable vertebroplasty early next week. Patient is just bed to chair for now. Patient will be started on IV heparin to transition off Coumadin for atrial fibrillation. Continue current pain regimen with Lidoderm patch and when necessary oxycodone as needed for breakthrough pain. Patient being followed by our probable vertebroplasty early next week once anticoagulation is reversed.  #2 hypothyroidism TSH elevated at 8.840. Increased Synthroid to 125 mcg daily.  #3 atrial fibrillation Continue metoprolol for rate control.  INR therapeutic. Coumadin on hold secondary to probable vertebroplasty to be done early next week. Will place on IV heparin when INR < 2.   #4 hypertension Stable. Continue metoprolol.  #5 constipation Patient would bowel movement yesterday. Continue MiraLAX twice daily and Senokot at bedtime.   #6 chronic diastolic heart failure Stable. Pro BNP was 143.6. Continue metoprolol. Follow.  #7 thrombocytopenia Questionable etiology. No active bleeding. Will monitor for now.  #8 morbid obesity  #9 bilateral lower extremity edema Likely secondary to chronic venous stasis secondary to morbid obesity in the setting of steroids. Patient states this has been chronic. Pro BNP was 143.6. Synthroid dose has been increased.  Outpatient followup. Follow.  #10 coagulopathy INR of 2.09. Pro BNP was 143.6. Coumadin on hold. We'll place on heparin once INR is less than 2.  #11 history of dermatomyositis on chronic steroids Continue current regimen of prednisone, Plaquenil, methotrexate.  #12 prophylaxis PPI for GI prophylaxis. INR is therapeutic at 2.09.   Code Status: DO NOT RESUSCITATE Family Communication: Updated patient and husband at bedtime. Disposition Plan: SNF post probable vertebroplasty.   Consultants:  Interventional radiology   Procedures:  X-ray of the lumbar spine 02/18/2014  CT of the lumbar spine 02/20/2014  Antibiotics:  None  HPI/Subjective: Patient states pain is somewhat improved since admission. Patient was bowel movement yesterday. No other complaints.  Objective: Filed Vitals:   02/21/14 0603  BP: 134/50  Pulse: 72  Temp: 97.9 F (36.6 C)  Resp: 16    Intake/Output Summary (Last 24 hours) at 02/21/14 0932 Last data filed at 02/21/14 0245  Gross per 24 hour  Intake    240 ml  Output    915 ml  Net   -675 ml   Filed Weights   02/18/14 1509  Weight: 117.935 kg (260 lb)    Exam:   General:  Obese, NAD  Cardiovascular: Regular rate rhythm no murmurs rubs or gallops. Distant heart sounds.  Respiratory: Clear to auscultation bilaterally. Distant breath sounds secondary to body habitus.  Abdomen: Soft, nontender, obese, positive bowel sounds, nondistended.  Musculoskeletal: No clubbing no cyanosis. Nonpitting bilateral lower extremity edema per patient chronic  Data Reviewed: Basic Metabolic Panel:  Recent Labs Lab 02/18/14 1331 02/19/14 0447 02/20/14 0535 02/21/14 0432  NA 140 137 136* 139  K 4.1 4.8 4.2 4.2  CL 106 105 103 105  CO2 24 25 25 26   GLUCOSE 82 92 76 83  BUN 20 20  20 17  CREATININE 0.92 0.96 0.95 0.90  CALCIUM 8.4 8.4 8.2* 8.1*   Liver Function Tests:  Recent Labs Lab 02/18/14 1331  AST 42*  ALT 26  ALKPHOS 105  BILITOT  0.5  PROT 5.6*  ALBUMIN 2.4*   No results found for this basename: LIPASE, AMYLASE,  in the last 168 hours No results found for this basename: AMMONIA,  in the last 168 hours CBC:  Recent Labs Lab 02/18/14 1331 02/19/14 0447 02/20/14 0535 02/21/14 0432  WBC 6.5 4.9 5.3 5.0  NEUTROABS 4.2  --   --   --   HGB 12.1 11.4* 11.2* 11.5*  HCT 36.7 35.6* 34.3* 35.9*  MCV 97.3 99.4 97.2 99.4  PLT 110* 93* 99* 100*   Cardiac Enzymes: No results found for this basename: CKTOTAL, CKMB, CKMBINDEX, TROPONINI,  in the last 168 hours BNP (last 3 results)  Recent Labs  02/18/14 2014  PROBNP 143.6*   CBG: No results found for this basename: GLUCAP,  in the last 168 hours  No results found for this or any previous visit (from the past 240 hour(s)).   Studies: Ct Lumbar Spine Wo Contrast  02/20/2014   CLINICAL DATA:  Fall.  Evaluate T12 fracture  EXAM: CT LUMBAR SPINE WITHOUT CONTRAST  TECHNIQUE: Multidetector CT imaging of the lumbar spine was performed without intravenous contrast administration. Multiplanar CT image reconstructions were also generated.  COMPARISON:  Lumbar radiographs 02/18/2014  FINDINGS: Burst fracture T12 involving all 3 columns. Retropulsion of bone into the canal by approximately 7 mm. Minimal kyphosis at the fracture level. This appears to be a benign fracture.  No other fracture is identified.  No focal bony lesion.  Image quality limited due to morbid obesity. Lower lumbar spine poorly visualized due to patient size.  IMPRESSION: Burst fracture T12. 7 mm retropulsion of bone into the canal causing 50% diameter stenosis of the canal. This appears to be a benign fracture.  Bony detail limited due to patient size.   Electronically Signed   By: Franchot Gallo M.D.   On: 02/20/2014 15:03   Mr Attempted [name] Report  02/19/2014   This examination belongs to an outside facility and is stored  here for comparison purposes only.  Contact the originating outside  institution  for any associated report or interpretation.   Scheduled Meds: . cyclobenzaprine  10 mg Oral BID  . hydroxychloroquine  200 mg Oral BID  . levothyroxine  125 mcg Oral QAC breakfast  . lidocaine  1 patch Transdermal Q24H  . methotrexate  12.5 mg Oral Weekly  . metoprolol succinate  25 mg Oral Daily  . pantoprazole  40 mg Oral Daily  . polyethylene glycol  17 g Oral BID  . predniSONE  10 mg Oral Q breakfast  . senna-docusate  1 tablet Oral QHS   Continuous Infusions:   Principal Problem:   T12 compression fracture Active Problems:   HYPERTENSION, UNSPECIFIED   Atrial fibrillation   LEG EDEMA, BILATERAL   Morbid obesity   Chronic anticoagulation   Chronic steroid use   Unspecified constipation   Chronic diastolic heart failure   Dermatomyositis    Time spent: 35 mins    [facility]  MD [facility] Pager [phone]. If 7PM-7AM, please contact night-coverage at [email or web address], password [facility] 02/21/2014, 9:32 AM  LOS: 3 days

## 2014-02-22 DIAGNOSIS — E039 Hypothyroidism, unspecified: Secondary | ICD-10-CM

## 2014-02-22 LAB — CBC
HCT: 35 % — ABNORMAL LOW (ref 36.0–46.0)
Hemoglobin: 11.2 g/dL — ABNORMAL LOW (ref 12.0–15.0)
MCH: 31.8 pg (ref 26.0–34.0)
MCHC: 32 g/dL (ref 30.0–36.0)
MCV: 99.4 fL (ref 78.0–100.0)
Platelets: 110 10*3/uL — ABNORMAL LOW (ref 150–400)
RBC: 3.52 MIL/uL — ABNORMAL LOW (ref 3.87–5.11)
RDW: 17.7 % — ABNORMAL HIGH (ref 11.5–15.5)
WBC: 6.4 10*3/uL (ref 4.0–10.5)

## 2014-02-22 LAB — HEPARIN LEVEL (UNFRACTIONATED)
Heparin Unfractionated: 0.31 IU/mL (ref 0.30–0.70)
Heparin Unfractionated: 0.33 IU/mL (ref 0.30–0.70)

## 2014-02-22 LAB — PROTIME-INR
INR: 1.8 — ABNORMAL HIGH (ref 0.00–1.49)
Prothrombin Time: 20.9 seconds — ABNORMAL HIGH (ref 11.6–15.2)

## 2014-02-22 NOTE — Progress Notes (Signed)
[facility] PROGRESS NOTE  [name] OQH:[id number] DOB: 07/27/1945 DOA: 02/18/2014 PCP: Tommy Medal, MD  Assessment/Plan: #1 T12 compression fracture Likely mechanical in nature. Patient states pain has improved since admission, however has not moved around. Per admitting physician, Dr. Maryland Pink, neurosurgery was curbside and recommended brace with conservative treatment. Interventional radiology assessing for possible kyphoplasty. MRI ordered but unable to be done secondary to body habitus. CT of the lumbar spine which was done shows a burst fracture with 50% diameter canal stenosis. Patient has been seen by interventional radiology and patient for probable vertebroplasty early next week. Patient is just bed to chair for now. Patient has been started on IV heparin to transition off Coumadin for atrial fibrillation. Continue current pain regimen with Lidoderm patch and when necessary oxycodone as needed for breakthrough pain. Patient being followed by IR for probable vertebroplasty early next week once anticoagulation is reversed.patient CT scan has been reviewed by neurosurgery and feels that as patient is neurologically intact at this time, no decompression is required. IR for probable vertebroplasty early next week.  #2 hypothyroidism TSH elevated at 8.840. Increased Synthroid to 125 mcg daily.  #3 atrial fibrillation Continue metoprolol for rate control.  INR subtherapeutic. Coumadin on hold secondary to probable vertebroplasty to be done early next week. Heparin IV has been started, to bridge while waiting vertebroplasty.  #4 hypertension Stable. Continue metoprolol.  #5 constipation Patient with bowel movement. Continue MiraLAX twice daily and Senokot at bedtime.   #6 chronic diastolic heart failure Stable. Pro BNP was 143.6. Continue metoprolol. Follow.  #7 thrombocytopenia Questionable etiology. No active bleeding. Slowly improving.  #8 morbid obesity  #9 bilateral lower  extremity edema Likely secondary to chronic venous stasis secondary to morbid obesity in the setting of steroids. Patient states this has been chronic. Pro BNP was 143.6. Synthroid dose has been increased. Outpatient followup. Follow.  #10 coagulopathy INR of 1.80. Pro BNP was 143.6. Heparin has been started while Coumadin on hold.  #11 history of dermatomyositis on chronic steroids Continue current regimen of prednisone, Plaquenil, methotrexate.  #12 prophylaxis PPI for GI prophylaxis. On IV heparin.   Code Status: DO NOT RESUSCITATE Family Communication: Updated patient and husband at bedtime. Disposition Plan: SNF post probable vertebroplasty.   Consultants:  Interventional radiology   Procedures:  X-ray of the lumbar spine 02/18/2014  CT of the lumbar spine 02/20/2014  Antibiotics:  None  HPI/Subjective: Patient states pain is somewhat controlled. No other complaints.  Objective: Filed Vitals:   02/22/14 0501  BP: 106/45  Pulse: 73  Temp: 98.9 F (37.2 C)  Resp: 16    Intake/Output Summary (Last 24 hours) at 02/22/14 0959 Last data filed at 02/22/14 0923  Gross per 24 hour  Intake 855.13 ml  Output    350 ml  Net 505.13 ml   Filed Weights   02/18/14 1509  Weight: 117.935 kg (260 lb)    Exam:   General:  Obese, NAD  Cardiovascular: Regular rate rhythm no murmurs rubs or gallops. Distant heart sounds.  Respiratory: Clear to auscultation bilaterally. Distant breath sounds secondary to body habitus.  Abdomen: Soft, nontender, obese, positive bowel sounds, nondistended.  Musculoskeletal: No clubbing no cyanosis. Nonpitting bilateral lower extremity edema per patient chronic  Data Reviewed: Basic Metabolic Panel:  Recent Labs Lab 02/18/14 1331 02/19/14 0447 02/20/14 0535 02/21/14 0432  NA 140 137 136* 139  K 4.1 4.8 4.2 4.2  CL 106 105 103 105  CO2 24 25 25 26   GLUCOSE  82 92 76 83  BUN 20 20 20 17   CREATININE 0.92 0.96 0.95 0.90   CALCIUM 8.4 8.4 8.2* 8.1*   Liver Function Tests:  Recent Labs Lab 02/18/14 1331  AST 42*  ALT 26  ALKPHOS 105  BILITOT 0.5  PROT 5.6*  ALBUMIN 2.4*   No results found for this basename: LIPASE, AMYLASE,  in the last 168 hours No results found for this basename: AMMONIA,  in the last 168 hours CBC:  Recent Labs Lab 02/18/14 1331 02/19/14 0447 02/20/14 0535 02/21/14 0432 02/22/14 0440  WBC 6.5 4.9 5.3 5.0 6.4  NEUTROABS 4.2  --   --   --   --   HGB 12.1 11.4* 11.2* 11.5* 11.2*  HCT 36.7 35.6* 34.3* 35.9* 35.0*  MCV 97.3 99.4 97.2 99.4 99.4  PLT 110* 93* 99* 100* 110*   Cardiac Enzymes: No results found for this basename: CKTOTAL, CKMB, CKMBINDEX, TROPONINI,  in the last 168 hours BNP (last 3 results)  Recent Labs  02/18/14 2014  PROBNP 143.6*   CBG: No results found for this basename: GLUCAP,  in the last 168 hours  No results found for this or any previous visit (from the past 240 hour(s)).   Studies: Ct Lumbar Spine Wo Contrast  02/20/2014   CLINICAL DATA:  Fall.  Evaluate T12 fracture  EXAM: CT LUMBAR SPINE WITHOUT CONTRAST  TECHNIQUE: Multidetector CT imaging of the lumbar spine was performed without intravenous contrast administration. Multiplanar CT image reconstructions were also generated.  COMPARISON:  Lumbar radiographs 02/18/2014  FINDINGS: Burst fracture T12 involving all 3 columns. Retropulsion of bone into the canal by approximately 7 mm. Minimal kyphosis at the fracture level. This appears to be a benign fracture.  No other fracture is identified.  No focal bony lesion.  Image quality limited due to morbid obesity. Lower lumbar spine poorly visualized due to patient size.  IMPRESSION: Burst fracture T12. 7 mm retropulsion of bone into the canal causing 50% diameter stenosis of the canal. This appears to be a benign fracture.  Bony detail limited due to patient size.   Electronically Signed   By: Franchot Gallo M.D.   On: 02/20/2014 15:03    Scheduled  Meds: . cyclobenzaprine  10 mg Oral BID  . hydroxychloroquine  200 mg Oral BID  . levothyroxine  125 mcg Oral QAC breakfast  . lidocaine  1 patch Transdermal Q24H  . methotrexate  12.5 mg Oral Weekly  . metoprolol succinate  25 mg Oral Daily  . pantoprazole  40 mg Oral Daily  . polyethylene glycol  17 g Oral BID  . predniSONE  10 mg Oral Q breakfast  . senna-docusate  1 tablet Oral QHS   Continuous Infusions: . heparin 1,150 Units/hr (02/21/14 1815)    Principal Problem:   T12 compression fracture Active Problems:   HYPERTENSION, UNSPECIFIED   Atrial fibrillation   LEG EDEMA, BILATERAL   Morbid obesity   Chronic anticoagulation   Chronic steroid use   Unspecified constipation   Chronic diastolic heart failure   Dermatomyositis   Spinal cord compression    Time spent: 35 mins    [facility]  MD [facility] Pager [phone]. If 7PM-7AM, please contact night-coverage at [email or web address], password [facility] 02/22/2014, 9:59 AM  LOS: 4 days

## 2014-02-22 NOTE — Progress Notes (Signed)
ANTICOAGULATION CONSULT NOTE - Follow Up Consult  Pharmacy Consult for heparin when INR <2 Indication: atrial fibrillation  Allergies  Allergen Reactions  . Butoconazole     'Redness and swelling feverish ' 'worse symptoms '  . Lipitor [Atorvastatin Calcium] Other (See Comments)    'Muscle weakness'      Patient Measurements: Height: 5' (152.4 cm) Weight: 260 lb (117.935 kg) IBW/kg (Calculated) : 45.5 Heparin Dosing Weight: 75 kg  Vital Signs: Temp: 98.9 F (37.2 C) (09/12 0501) Temp src: Oral (09/12 0501) BP: 106/45 mmHg (09/12 0501) Pulse Rate: 73 (09/12 0501)  Labs:  Recent Labs  02/20/14 0535 02/21/14 0432 02/21/14 2350 02/22/14 0440  HGB 11.2* 11.5*  --  11.2*  HCT 34.3* 35.9*  --  35.0*  PLT 99* 100*  --  110*  LABPROT 30.6* 23.5*  --  20.9*  INR 2.93* 2.09*  --  1.80*  HEPARINUNFRC  --   --  0.33 0.31  CREATININE 0.95 0.90  --   --     Estimated Creatinine Clearance: 71.3 ml/min (by C-G formula based on Cr of 0.9).   Medications:  Scheduled:  . cyclobenzaprine  10 mg Oral BID  . hydroxychloroquine  200 mg Oral BID  . levothyroxine  125 mcg Oral QAC breakfast  . lidocaine  1 patch Transdermal Q24H  . methotrexate  12.5 mg Oral Weekly  . metoprolol succinate  25 mg Oral Daily  . pantoprazole  40 mg Oral Daily  . polyethylene glycol  17 g Oral BID  . predniSONE  10 mg Oral Q breakfast  . senna-docusate  1 tablet Oral QHS    Assessment: 68 yo female on chronic warfarin therapy for afib. Admitted 9/8 with back pain after an injury 1 week ago. Pharmacy is asked to dose IV heparin as warfarin will be held in anticipation of surgery.  Home warfarin dose 2.5mg  MWFSS, 5mg  TTh, last taken 9/7   Goal of Therapy:  Heparin level 0.3-0.7 units/ml Monitor platelets by anticoagulation protocol: Yes    Plan:   Heparin level 0.31, continue @ 1150 units/hr  Daily heparin level and CBC (watch platelet count)  Check INR daily for now as needed to perform  procedure   Dolly Rias RPh 02/22/2014, 7:34 AM Pager [phone]

## 2014-02-22 NOTE — Care Management Note (Signed)
    Page 1 of 2   03/01/2014     1:09:11 PM CARE MANAGEMENT NOTE 03/01/2014  Patient:  [name], [name]   Account Number:  [id number]  Date Initiated:  02/21/2014  Documentation initiated by:  [name],[name]  Subjective/Objective Assessment:   ct scan of the lumbar spine confirms t12 three level burst fracture of the spinal with compression of the cord/kyphosis at [place] will be done when inr is below 2 and can be switched to iv heparin.   0911-2.05     Action/Plan:   to home once spine is stablized.   Anticipated DC Date:  02/28/2014   Anticipated DC Plan:  [place] referral  Clinical Social Worker      DC Planning Services  NA      [facility] Choice  NA   Choice offered to / List presented to:  C-1 Patient   DME arranged  3-N-1  Vassie Moselle      DME agency  [place] arranged  [place] agency  NA   Status of service:  Completed, signed off Medicare Important Message given?  YES (If response is "NO", the following Medicare IM given date fields will be blank) Date Medicare IM given:  02/27/2014 Medicare IM given by:  Magdalen Spatz Date Additional Medicare IM given:   Additional Medicare IM given by:    Discharge Disposition:  [place]  Per UR Regulation:  Reviewed for med. necessity/level of care/duration of stay  If discussed at [place] of Stay Meetings, dates discussed:   02/27/2014    Comments:   03-01-14 1305 [name] Krauss, RN,BSN [phone] CM did send referral to [facility] to make them aware that pt is ready for d/c. No furhter needs from CM at this time.  02/20/14 08:15 LATE ENTRY: Md notified 02/20/14 (via text - Amion) pt is OBS and needs to progress with inpt intervention or be discharged with an oupt plan.  [name], [place].  [name] Davis,RN,BSN,CCM

## 2014-02-22 NOTE — Progress Notes (Signed)
Material about vertebraplasty provided for pt. [name], [employer]

## 2014-02-22 NOTE — Progress Notes (Addendum)
Physical Therapy Treatment Patient Details Name: AZUCENA DART MRN: [id number] DOB: Nov 21, 1945 Today's Date: 02/22/2014    History of Present Illness Past medical history of atrial fibrillation on chronic Coumadin, chronic diastolic heart failure and chronic steroid use secondary to dermatomyositis who one week prior was getting out of her husband's elevated truck which was approximately 2 feet from the ground landing on both feet. Immediately upon landing, she felt something pop in her lower back she started having significant pain. Pain was described as right in the middle of her lower back radiating left and right, X-rays done noted in acute T12 compression fracture with 80% loss. Planned KP 02/25/14    PT Comments    Pt motivated to try mobilizing but remains limited by pain. Tolerated rolling well but pt could not tolerate pain once transitioning to sitting. Unable to reach full upright sitting posture. Assisted pt back to supine. Pt states she will continue to perform exercises as able.   Follow Up Recommendations  SNF (HHPT only if pain/mobility improve significantly)     Equipment Recommendations  Rolling walker with 5" wheels (bariatric)    Recommendations for Other Services       Precautions / Restrictions Precautions Precautions: Back Precaution Comments: per Dr. Grandville Silos, bed to chair transfers only if tolerated. Required Braces or Orthoses: Spinal Brace Spinal Brace: Thoracolumbosacral orthotic Other Brace/Splint: TLSO brace in room. No indications when to apply (sitting vs supine).  Restrictions Weight Bearing Restrictions: No    Mobility  Bed Mobility Overal bed mobility: Needs Assistance Bed Mobility: Rolling;Sidelying to Sit;Sit to Sidelying Rolling: Min assist;+2 for physical assistance;+2 for safety/equipment Sidelying to sit: Mod assist;+2 for physical assistance;+2 for safety/equipment     Sit to sidelying: Mod assist;+2 for physical assistance;+2 for  safety/equipment General bed mobility comments: Assist for trunk and bil LEs. Pt tolerates rolling well but pain increased significantly with attempt to get to sitting. Pt put forth max effort but just could not tolerate the pain  Transfers                 General transfer comment: Pt could not tolerate sitting or transfer  Ambulation/Gait                 Stairs            Wheelchair Mobility    Modified Rankin (Stroke Patients Only)       Balance                                    Cognition Arousal/Alertness: Awake/alert Behavior During Therapy: WFL for tasks assessed/performed Overall Cognitive Status: Within Functional Limits for tasks assessed                      Exercises      General Comments        Pertinent Vitals/Pain Pain Assessment: 0-10 Pain Location: 0/10 at rest; 8/10 with attempt to transition to sitting Pain Intervention(s): Limited activity within patient's tolerance;Premedicated before session;Monitored during session    Home Living                      Prior Function            PT Goals (current goals can now be found in the care plan section) Progress towards PT goals: Progressing toward goals (but remains limited by pain)  Frequency  Min 3X/week    PT Plan Current plan remains appropriate    Co-evaluation             End of Session   Activity Tolerance: Patient limited by pain Patient left: in bed;with call bell/phone within reach;with family/visitor present     Time: 1240-1308 PT Time Calculation (min): 28 min  Charges:  $Therapeutic Activity: 23-37 mins                    G Codes:      Weston Anna, MPT Pager: [phone]

## 2014-02-22 NOTE — Progress Notes (Signed)
PHARMACY - HEPARIN (brief note)  Heparin drip infusing @ 1150 units/hr Heparin level = 0.33 (Goal 8.4-6.6) No complications of therapy noted  PLAN:  Continue IV Heparin @ 1150 units/hr             Recheck HL in 6 hr to confirm therapeutic dose  Leone Haven, PharmD 02/22/14 @ 00:40

## 2014-02-23 LAB — CBC
HCT: 33.3 % — ABNORMAL LOW (ref 36.0–46.0)
Hemoglobin: 11 g/dL — ABNORMAL LOW (ref 12.0–15.0)
MCH: 32 pg (ref 26.0–34.0)
MCHC: 33 g/dL (ref 30.0–36.0)
MCV: 96.8 fL (ref 78.0–100.0)
Platelets: 112 10*3/uL — ABNORMAL LOW (ref 150–400)
RBC: 3.44 MIL/uL — ABNORMAL LOW (ref 3.87–5.11)
RDW: 17.4 % — ABNORMAL HIGH (ref 11.5–15.5)
WBC: 6.9 10*3/uL (ref 4.0–10.5)

## 2014-02-23 LAB — PROTIME-INR
INR: 1.56 — ABNORMAL HIGH (ref 0.00–1.49)
Prothrombin Time: 18.7 seconds — ABNORMAL HIGH (ref 11.6–15.2)

## 2014-02-23 LAB — HEPARIN LEVEL (UNFRACTIONATED): Heparin Unfractionated: 0.58 IU/mL (ref 0.30–0.70)

## 2014-02-23 MED ORDER — LORATADINE 10 MG PO TABS
10.0000 mg | ORAL_TABLET | Freq: Every day | ORAL | Status: DC
  Administered 2014-02-23 – 2014-03-01 (×7): 10 mg via ORAL
  Filled 2014-02-23 (×8): qty 1

## 2014-02-23 MED ORDER — PSEUDOEPHEDRINE HCL ER 120 MG PO TB12
120.0000 mg | ORAL_TABLET | Freq: Two times a day (BID) | ORAL | Status: DC
  Administered 2014-02-23 – 2014-03-01 (×13): 120 mg via ORAL
  Filled 2014-02-23 (×16): qty 1

## 2014-02-23 NOTE — Progress Notes (Signed)
[facility] PROGRESS NOTE  [name] GYI:[id number] DOB: 22-Sep-1945 DOA: 02/18/2014 PCP: Tommy Medal, MD  Assessment/Plan: #1 T12 compression fracture Likely mechanical in nature. Patient states pain has improved since admission, however has not moved around. Per admitting physician, Dr. [place] Pink, neurosurgery was curbside and recommended brace with conservative treatment. Interventional radiology assessing for possible kyphoplasty. MRI ordered but unable to be done secondary to body habitus. CT of the lumbar spine which was done shows a burst fracture with 50% diameter canal stenosis. Patient has been seen by interventional radiology and patient for probable vertebroplasty early next week. Patient is just bed to chair for now. Patient has been started on IV heparin to transition off Coumadin for atrial fibrillation. Continue current pain regimen with Lidoderm patch and when necessary oxycodone as needed for breakthrough pain. Patient being followed by IR for probable vertebroplasty early next week once anticoagulation is reversed. Patient's CT scan has been reviewed by neurosurgery and feel that as patient is neurologically intact at this time, no decompression is required. IR for probable vertebroplasty early next week.  #2 hypothyroidism TSH elevated at 8.840. Continue increased dose of Synthroid to 125 mcg daily.  #3 atrial fibrillation Continue metoprolol for rate control.  INR subtherapeutic. Coumadin on hold secondary to probable vertebroplasty to be done early next week. Heparin IV has been started, to bridge while waiting vertebroplasty.  #4 hypertension Stable. Continue metoprolol.  #5 constipation Patient with bowel movement. Continue MiraLAX twice daily and Senokot at bedtime.   #6 chronic diastolic heart failure Stable. Pro BNP was 143.6. Continue metoprolol. Follow.  #7 thrombocytopenia Questionable etiology. No active bleeding. Slowly improving.  #8 morbid  obesity  #9 bilateral lower extremity edema Likely secondary to chronic venous stasis secondary to morbid obesity in the setting of steroids. Patient states this has been chronic. Pro BNP was 143.6. Synthroid dose has been increased. Outpatient followup. Follow.  #10 coagulopathy INR of 1.56. Pro BNP was 143.6. Heparin has been started while Coumadin on hold.  #11 history of dermatomyositis on chronic steroids Continue current regimen of prednisone, Plaquenil, methotrexate.  #12 prophylaxis PPI for GI prophylaxis. On IV heparin.   Code Status: DO NOT RESUSCITATE Family Communication: Updated patient and husband at bedtime. Disposition Plan: SNF post probable vertebroplasty.   Consultants:  Interventional radiology   Procedures:  X-ray of the lumbar spine 02/18/2014  CT of the lumbar spine 02/20/2014  Antibiotics:  None  HPI/Subjective: Patient states pain is somewhat controlled. Patient complaining of congestion. Patient denies any numbness or tingling in the lower extremities.  Objective: Filed Vitals:   02/23/14 0557  BP: 124/56  Pulse: 76  Temp: 98.2 F (36.8 C)  Resp: 18    Intake/Output Summary (Last 24 hours) at 02/23/14 1030 Last data filed at 02/23/14 0949  Gross per 24 hour  Intake 706.38 ml  Output    950 ml  Net -243.62 ml   Filed Weights   02/18/14 1509  Weight: 117.935 kg (260 lb)    Exam:   General:  Obese, NAD  Cardiovascular: Regular rate rhythm no murmurs rubs or gallops. Distant heart sounds.  Respiratory: Clear to auscultation bilaterally. Distant breath sounds secondary to body habitus.  Abdomen: Soft, nontender, obese, positive bowel sounds, nondistended.  Musculoskeletal: No clubbing no cyanosis. Nonpitting bilateral lower extremity edema per patient chronic  Data Reviewed: Basic Metabolic Panel:  Recent Labs Lab 02/18/14 1331 02/19/14 0447 02/20/14 0535 02/21/14 0432  NA 140 137 136* 139  K 4.1 4.8 4.2  4.2  CL  106 105 103 105  CO2 24 25 25 26   GLUCOSE 82 92 76 83  BUN 20 20 20 17   CREATININE 0.92 0.96 0.95 0.90  CALCIUM 8.4 8.4 8.2* 8.1*   Liver Function Tests:  Recent Labs Lab 02/18/14 1331  AST 42*  ALT 26  ALKPHOS 105  BILITOT 0.5  PROT 5.6*  ALBUMIN 2.4*   No results found for this basename: LIPASE, AMYLASE,  in the last 168 hours No results found for this basename: AMMONIA,  in the last 168 hours CBC:  Recent Labs Lab 02/18/14 1331 02/19/14 0447 02/20/14 0535 02/21/14 0432 02/22/14 0440 02/23/14 0518  WBC 6.5 4.9 5.3 5.0 6.4 6.9  NEUTROABS 4.2  --   --   --   --   --   HGB 12.1 11.4* 11.2* 11.5* 11.2* 11.0*  HCT 36.7 35.6* 34.3* 35.9* 35.0* 33.3*  MCV 97.3 99.4 97.2 99.4 99.4 96.8  PLT 110* 93* 99* 100* 110* 112*   Cardiac Enzymes: No results found for this basename: CKTOTAL, CKMB, CKMBINDEX, TROPONINI,  in the last 168 hours BNP (last 3 results)  Recent Labs  02/18/14 2014  PROBNP 143.6*   CBG: No results found for this basename: GLUCAP,  in the last 168 hours  No results found for this or any previous visit (from the past 240 hour(s)).   Studies: No results found.  Scheduled Meds: . cyclobenzaprine  10 mg Oral BID  . hydroxychloroquine  200 mg Oral BID  . levothyroxine  125 mcg Oral QAC breakfast  . lidocaine  1 patch Transdermal Q24H  . methotrexate  12.5 mg Oral Weekly  . metoprolol succinate  25 mg Oral Daily  . pantoprazole  40 mg Oral Daily  . polyethylene glycol  17 g Oral BID  . predniSONE  10 mg Oral Q breakfast  . senna-docusate  1 tablet Oral QHS   Continuous Infusions: . heparin 1,150 Units/hr (02/22/14 1432)    Principal Problem:   T12 compression fracture Active Problems:   HYPERTENSION, UNSPECIFIED   Atrial fibrillation   LEG EDEMA, BILATERAL   Morbid obesity   Chronic anticoagulation   Chronic steroid use   Unspecified constipation   Chronic diastolic heart failure   Dermatomyositis   Spinal cord  compression    Time spent: 35 mins    [facility]  MD [facility] Pager [phone]. If 7PM-7AM, please contact night-coverage at [email or web address], password [facility] 02/23/2014, 10:30 AM  LOS: 5 days

## 2014-02-23 NOTE — Progress Notes (Signed)
ANTICOAGULATION CONSULT NOTE - Follow Up Consult  Pharmacy Consult for heparin when INR <2 Indication: atrial fibrillation  Allergies  Allergen Reactions  . Butoconazole     'Redness and swelling feverish ' 'worse symptoms '  . Lipitor [Atorvastatin Calcium] Other (See Comments)    'Muscle weakness'      Patient Measurements: Height: 5' (152.4 cm) Weight: 260 lb (117.935 kg) IBW/kg (Calculated) : 45.5 Heparin Dosing Weight: 75 kg  Vital Signs: Temp: 98.2 F (36.8 C) (09/13 0557) Temp src: Oral (09/13 0557) BP: 124/56 mmHg (09/13 0557) Pulse Rate: 76 (09/13 0557)  Labs:  Recent Labs  02/21/14 0432 02/21/14 2350 02/22/14 0440 02/23/14 0518  HGB 11.5*  --  11.2* 11.0*  HCT 35.9*  --  35.0* 33.3*  PLT 100*  --  110* 112*  LABPROT 23.5*  --  20.9* 18.7*  INR 2.09*  --  1.80* 1.56*  HEPARINUNFRC  --  0.33 0.31 0.58  CREATININE 0.90  --   --   --     Estimated Creatinine Clearance: 71.3 ml/min (by C-G formula based on Cr of 0.9).   Medications:  Scheduled:  . cyclobenzaprine  10 mg Oral BID  . hydroxychloroquine  200 mg Oral BID  . levothyroxine  125 mcg Oral QAC breakfast  . lidocaine  1 patch Transdermal Q24H  . loratadine  10 mg Oral Daily  . methotrexate  12.5 mg Oral Weekly  . metoprolol succinate  25 mg Oral Daily  . pantoprazole  40 mg Oral Daily  . polyethylene glycol  17 g Oral BID  . predniSONE  10 mg Oral Q breakfast  . pseudoephedrine  120 mg Oral BID  . senna-docusate  1 tablet Oral QHS    Assessment: 68 yo female on chronic warfarin therapy for afib. Admitted 9/8 with back pain after an injury 1 week ago. Pharmacy is asked to dose IV heparin as warfarin will be held in anticipation of surgery.  Home warfarin dose 2.5mg  MWFSS, 5mg  TTh, last taken 9/7   Goal of Therapy:  Heparin level 0.3-0.7 units/ml Monitor platelets by anticoagulation protocol: Yes    Plan:   Heparin level 0.58, continue @ 1150 units/hr  No complications/bleeding per  RN  Daily heparin level and CBC (watch platelet count)  Check INR daily for now as needed to perform procedure   Dolly Rias RPh 02/23/2014, 10:51 AM Pager [phone]

## 2014-02-24 ENCOUNTER — Inpatient Hospital Stay (HOSPITAL_COMMUNITY): Payer: Medicare Other

## 2014-02-24 LAB — CBC
HCT: 33.5 % — ABNORMAL LOW (ref 36.0–46.0)
Hemoglobin: 11.1 g/dL — ABNORMAL LOW (ref 12.0–15.0)
MCH: 32.9 pg (ref 26.0–34.0)
MCHC: 33.1 g/dL (ref 30.0–36.0)
MCV: 99.4 fL (ref 78.0–100.0)
Platelets: 119 10*3/uL — ABNORMAL LOW (ref 150–400)
RBC: 3.37 MIL/uL — ABNORMAL LOW (ref 3.87–5.11)
RDW: 17.6 % — ABNORMAL HIGH (ref 11.5–15.5)
WBC: 6.2 10*3/uL (ref 4.0–10.5)

## 2014-02-24 LAB — BASIC METABOLIC PANEL
Anion gap: 9 (ref 5–15)
BUN: 13 mg/dL (ref 6–23)
CO2: 24 mEq/L (ref 19–32)
Calcium: 8.2 mg/dL — ABNORMAL LOW (ref 8.4–10.5)
Chloride: 108 mEq/L (ref 96–112)
Creatinine, Ser: 0.85 mg/dL (ref 0.50–1.10)
GFR calc Af Amer: 80 mL/min — ABNORMAL LOW (ref >90)
GFR calc non Af Amer: 69 mL/min — ABNORMAL LOW (ref >90)
Glucose, Bld: 76 mg/dL (ref 70–99)
Potassium: 4.3 mEq/L (ref 3.7–5.3)
Sodium: 141 mEq/L (ref 137–147)

## 2014-02-24 LAB — PROTIME-INR
INR: 1.53 — ABNORMAL HIGH (ref 0.00–1.49)
Prothrombin Time: 18.4 seconds — ABNORMAL HIGH (ref 11.6–15.2)

## 2014-02-24 LAB — HEPARIN LEVEL (UNFRACTIONATED): Heparin Unfractionated: 0.48 IU/mL (ref 0.30–0.70)

## 2014-02-24 MED ORDER — HEPARIN (PORCINE) IN NACL 100-0.45 UNIT/ML-% IJ SOLN
1150.0000 [IU]/h | INTRAMUSCULAR | Status: AC
  Administered 2014-02-24: 1150 [IU]/h via INTRAVENOUS
  Filled 2014-02-24: qty 250

## 2014-02-24 MED ORDER — CEFAZOLIN SODIUM-DEXTROSE 2-3 GM-% IV SOLR
2.0000 g | INTRAVENOUS | Status: AC
  Administered 2014-02-25: 2 g via INTRAVENOUS
  Filled 2014-02-24: qty 50

## 2014-02-24 NOTE — Progress Notes (Signed)
ANTICOAGULATION CONSULT NOTE - Follow Up Consult  Pharmacy Consult for heparin Indication: atrial fibrillation while off warfarin for procedure  Allergies  Allergen Reactions  . Butoconazole     'Redness and swelling feverish ' 'worse symptoms '  . Lipitor [Atorvastatin Calcium] Other (See Comments)    'Muscle weakness'      Patient Measurements: Height: 5' (152.4 cm) Weight: 260 lb (117.935 kg) IBW/kg (Calculated) : 45.5 Heparin Dosing Weight: 75 kg  Vital Signs: Temp: 98 F (36.7 C) (09/14 0447) Temp src: Oral (09/14 0447) BP: 140/78 mmHg (09/14 0447) Pulse Rate: 76 (09/14 0447)  Labs:  Recent Labs  02/22/14 0440 02/23/14 0518 02/24/14 0450  HGB 11.2* 11.0* 11.1*  HCT 35.0* 33.3* 33.5*  PLT 110* 112* 119*  LABPROT 20.9* 18.7* 18.4*  INR 1.80* 1.56* 1.53*  HEPARINUNFRC 0.31 0.58 0.48  CREATININE  --   --  0.85    Estimated Creatinine Clearance: 75.5 ml/min (by C-G formula based on Cr of 0.85).   Medications:  Scheduled:  . cyclobenzaprine  10 mg Oral BID  . hydroxychloroquine  200 mg Oral BID  . levothyroxine  125 mcg Oral QAC breakfast  . lidocaine  1 patch Transdermal Q24H  . loratadine  10 mg Oral Daily  . methotrexate  12.5 mg Oral Weekly  . metoprolol succinate  25 mg Oral Daily  . pantoprazole  40 mg Oral Daily  . polyethylene glycol  17 g Oral BID  . predniSONE  10 mg Oral Q breakfast  . pseudoephedrine  120 mg Oral BID  . senna-docusate  1 tablet Oral QHS    Assessment: 68 yo female on chronic warfarin therapy for afib. Admitted 9/8 with back pain after an injury 1 week ago. Pharmacy is asked to dose IV heparin as warfarin will be held in anticipation of surgery.  Home warfarin dose 2.5mg  MWFSS, 5mg  TTh, last taken 9/7 Has remained off warfarin since admission.  Goal of Therapy:  Heparin level 0.3-0.7 units/ml Monitor platelets by anticoagulation protocol: Yes   9/14: Assessment:  Heparin level therapeutic.  INR falling slowly;  remains just above 1.5.  According to latest INR notes, the earliest kyphoplasty could be performed is tomorrow (9/15) at [facility].  H/H low but stable.  Pltc improving.  No bleeding reported in chart notes.  Patient alert, conversant, comfortable, understands plans for anticoagulant bridging.   Plan:  1. Continue heparin at present rate (1150 units/hr). 2. Continue to hold warfarin. 3. Watch pltc. 4. Await further word on timing of kyphoplasty. 5. Will need orders from IR on when to stop IV heparin preprocedure.  Clayburn Pert, PharmD, BCPS Pager: [phone] 02/24/2014  8:33 AM

## 2014-02-24 NOTE — Progress Notes (Signed)
Patient ID: [name], female   DOB: 05/12/1946, 68 y.o.   MRN: [id number] Pt awaiting transfer to [facility] today. T12 VP tent scheduled for 9/15 at 1000. Will check labs/CXR preprocedure. Orders placed. Pt/family aware. Consent signed.

## 2014-02-24 NOTE — Progress Notes (Signed)
OT Cancellation Note  Patient Details Name: [name] MRN: [id number] DOB: Jul 11, 1945   Cancelled Treatment:    Reason Eval/Treat Not Completed: Other (comment) Pain limiting ability to participate. OT explained role of OT to pt. Spoke with MD and pt will [facility] . Will perform OT eval post procedure (vertebroplasty  Wrigley, [name] 02/24/2014, 11:07 AM

## 2014-02-24 NOTE — Progress Notes (Signed)
[facility] PROGRESS NOTE  [name] VZD:[id number] DOB: 06/12/1946 DOA: 02/18/2014 PCP: Tommy Medal, MD  Assessment/Plan: #1 T12 compression fracture Likely mechanical in nature. Patient states pain has improved since admission, however has not moved around. Per admitting physician, Dr. Maryland Pink, neurosurgery was curbside and recommended brace with conservative treatment. Interventional radiology assessing for possible kyphoplasty. MRI ordered but unable to be done secondary to body habitus. CT of the lumbar spine which was done shows a burst fracture with 50% diameter canal stenosis. Patient has been seen by interventional radiology and patient for probable vertebroplasty early next week. Patient is just bed to chair for now. Patient has been started on IV heparin to transition off Coumadin for atrial fibrillation. Continue current pain regimen with Lidoderm patch and when necessary oxycodone as needed for breakthrough pain. Patient being followed by IR for probable vertebroplasty early next week once anticoagulation is reversed. Patient's CT scan has been reviewed by neurosurgery and feel that as patient is neurologically intact at this time, no decompression is required. IR for probable vertebroplasty tomorrow. Post vertebroplasty patient will need to reassess by PT/OT and may need possible SNF placement versus home with home health.  #2 hypothyroidism TSH elevated at 8.840. Continue increased dose of Synthroid to 125 mcg daily. Patient will need thyroid function studies done in 4-6 weeks as outpatient.  #3 atrial fibrillation Continue metoprolol for rate control.  INR subtherapeutic. Coumadin on hold secondary to probable vertebroplasty to be done early next week. Heparin IV has been started, to bridge while waiting vertebroplasty.  #4 hypertension Stable. Continue metoprolol.  #5 constipation Patient with bowel movement. Continue MiraLAX twice daily and Senokot at bedtime.   #6  chronic diastolic heart failure Stable. Pro BNP was 143.6. Continue metoprolol. Follow.  #7 thrombocytopenia Questionable etiology. No active bleeding.   #8 morbid obesity  #9 bilateral lower extremity edema Likely secondary to chronic venous stasis secondary to morbid obesity in the setting of steroids. Patient states this has been chronic. Pro BNP was 143.6. Synthroid dose has been increased. Outpatient followup. Follow.  #10 coagulopathy INR of 1.53. Pro BNP was 143.6. Heparin has been started while Coumadin on hold.  #11 history of dermatomyositis on chronic steroids Continue current regimen of prednisone, Plaquenil, methotrexate.  #12 prophylaxis PPI for GI prophylaxis. On IV heparin.   Code Status: DO NOT RESUSCITATE Family Communication: Updated patient and husband and daughter at bedtime. Disposition Plan: Transfer to [facility] in anticipation of vertebroplasty to be done tomorrow. SNF post probable vertebroplasty.   Consultants:  Interventional radiology   Procedures:  X-ray of the lumbar spine 02/18/2014  CT of the lumbar spine 02/20/2014  Antibiotics:  None  HPI/Subjective: Patient states pain is somewhat controlled. Patient complaining of congestion. Patient denies any numbness or tingling in the lower extremities.  Objective: Filed Vitals:   02/24/14 0953  BP: 124/54  Pulse: 73  Temp:   Resp:     Intake/Output Summary (Last 24 hours) at 02/24/14 1153 Last data filed at 02/24/14 0941  Gross per 24 hour  Intake   1254 ml  Output   1650 ml  Net   -396 ml   Filed Weights   02/18/14 1509  Weight: 117.935 kg (260 lb)    Exam:   General:  Obese, NAD  Cardiovascular: Regular rate rhythm no murmurs rubs or gallops. Distant heart sounds.  Respiratory: Clear to auscultation bilaterally. Distant breath sounds secondary to body habitus.  Abdomen: Soft, nontender, obese, positive bowel sounds,  nondistended.  Musculoskeletal: No  clubbing no cyanosis. Nonpitting bilateral lower extremity edema per patient chronic  Data Reviewed: Basic Metabolic Panel:  Recent Labs Lab 02/18/14 1331 02/19/14 0447 02/20/14 0535 02/21/14 0432 02/24/14 0450  NA 140 137 136* 139 141  K 4.1 4.8 4.2 4.2 4.3  CL 106 105 103 105 108  CO2 24 25 25 26 24   GLUCOSE 82 92 76 83 76  BUN 20 20 20 17 13   CREATININE 0.92 0.96 0.95 0.90 0.85  CALCIUM 8.4 8.4 8.2* 8.1* 8.2*   Liver Function Tests:  Recent Labs Lab 02/18/14 1331  AST 42*  ALT 26  ALKPHOS 105  BILITOT 0.5  PROT 5.6*  ALBUMIN 2.4*   No results found for this basename: LIPASE, AMYLASE,  in the last 168 hours No results found for this basename: AMMONIA,  in the last 168 hours CBC:  Recent Labs Lab 02/18/14 1331  02/20/14 0535 02/21/14 0432 02/22/14 0440 02/23/14 0518 02/24/14 0450  WBC 6.5  < > 5.3 5.0 6.4 6.9 6.2  NEUTROABS 4.2  --   --   --   --   --   --   HGB 12.1  < > 11.2* 11.5* 11.2* 11.0* 11.1*  HCT 36.7  < > 34.3* 35.9* 35.0* 33.3* 33.5*  MCV 97.3  < > 97.2 99.4 99.4 96.8 99.4  PLT 110*  < > 99* 100* 110* 112* 119*  < > = values in this interval not displayed. Cardiac Enzymes: No results found for this basename: CKTOTAL, CKMB, CKMBINDEX, TROPONINI,  in the last 168 hours BNP (last 3 results)  Recent Labs  02/18/14 2014  PROBNP 143.6*   CBG: No results found for this basename: GLUCAP,  in the last 168 hours  No results found for this or any previous visit (from the past 240 hour(s)).   Studies: No results found.  Scheduled Meds: . cyclobenzaprine  10 mg Oral BID  . hydroxychloroquine  200 mg Oral BID  . levothyroxine  125 mcg Oral QAC breakfast  . lidocaine  1 patch Transdermal Q24H  . loratadine  10 mg Oral Daily  . methotrexate  12.5 mg Oral Weekly  . metoprolol succinate  25 mg Oral Daily  . pantoprazole  40 mg Oral Daily  . polyethylene glycol  17 g Oral BID  . predniSONE  10 mg Oral Q breakfast  . pseudoephedrine  120 mg  Oral BID  . senna-docusate  1 tablet Oral QHS   Continuous Infusions: . heparin 1,150 Units/hr (02/24/14 0841)    Principal Problem:   T12 compression fracture Active Problems:   HYPERTENSION, UNSPECIFIED   Atrial fibrillation   LEG EDEMA, BILATERAL   Morbid obesity   Chronic anticoagulation   Chronic steroid use   Unspecified constipation   Chronic diastolic heart failure   Dermatomyositis   Spinal cord compression    Time spent: 35 mins    [facility]  MD [facility] Pager [phone]. If 7PM-7AM, please contact night-coverage at [email or web address], password [facility] 02/24/2014, 11:53 AM  LOS: 6 days

## 2014-02-25 ENCOUNTER — Inpatient Hospital Stay (HOSPITAL_COMMUNITY): Payer: Medicare Other

## 2014-02-25 DIAGNOSIS — R609 Edema, unspecified: Secondary | ICD-10-CM

## 2014-02-25 LAB — BASIC METABOLIC PANEL
Anion gap: 8 (ref 5–15)
BUN: 14 mg/dL (ref 6–23)
CO2: 28 mEq/L (ref 19–32)
Calcium: 8.3 mg/dL — ABNORMAL LOW (ref 8.4–10.5)
Chloride: 105 mEq/L (ref 96–112)
Creatinine, Ser: 0.88 mg/dL (ref 0.50–1.10)
GFR calc Af Amer: 77 mL/min — ABNORMAL LOW (ref >90)
GFR calc non Af Amer: 66 mL/min — ABNORMAL LOW (ref >90)
Glucose, Bld: 89 mg/dL (ref 70–99)
Potassium: 3.9 mEq/L (ref 3.7–5.3)
Sodium: 141 mEq/L (ref 137–147)

## 2014-02-25 MED ORDER — WARFARIN SODIUM 5 MG PO TABS
5.0000 mg | ORAL_TABLET | Freq: Once | ORAL | Status: AC
  Administered 2014-02-25: 5 mg via ORAL
  Filled 2014-02-25: qty 1

## 2014-02-25 MED ORDER — IOHEXOL 300 MG/ML  SOLN
50.0000 mL | Freq: Once | INTRAMUSCULAR | Status: AC | PRN
  Administered 2014-02-25: 1 mL via INTRAVENOUS

## 2014-02-25 MED ORDER — BUPIVACAINE HCL (PF) 0.25 % IJ SOLN
INTRAMUSCULAR | Status: AC
  Filled 2014-02-25: qty 30

## 2014-02-25 MED ORDER — FENTANYL CITRATE 0.05 MG/ML IJ SOLN
INTRAMUSCULAR | Status: AC
  Filled 2014-02-25: qty 6

## 2014-02-25 MED ORDER — MIDAZOLAM HCL 2 MG/2ML IJ SOLN
INTRAMUSCULAR | Status: AC
  Filled 2014-02-25: qty 6

## 2014-02-25 MED ORDER — HYDROMORPHONE HCL PF 1 MG/ML IJ SOLN
INTRAMUSCULAR | Status: AC | PRN
  Administered 2014-02-25: 0.5 mg

## 2014-02-25 MED ORDER — CEFAZOLIN SODIUM-DEXTROSE 2-3 GM-% IV SOLR
INTRAVENOUS | Status: AC
  Administered 2014-02-25: 2 g via INTRAVENOUS
  Filled 2014-02-25: qty 50

## 2014-02-25 MED ORDER — FENTANYL CITRATE 0.05 MG/ML IJ SOLN
INTRAMUSCULAR | Status: AC | PRN
  Administered 2014-02-25: 25 ug via INTRAVENOUS

## 2014-02-25 MED ORDER — MIDAZOLAM HCL 2 MG/2ML IJ SOLN
INTRAMUSCULAR | Status: AC | PRN
  Administered 2014-02-25 (×2): 1 mg via INTRAVENOUS

## 2014-02-25 MED ORDER — WARFARIN - PHARMACIST DOSING INPATIENT
Freq: Every day | Status: DC
  Administered 2014-02-27: 17:00:00

## 2014-02-25 MED ORDER — HYDROMORPHONE HCL PF 1 MG/ML IJ SOLN
INTRAMUSCULAR | Status: AC
  Filled 2014-02-25: qty 1

## 2014-02-25 NOTE — Progress Notes (Signed)
PT Cancellation Note  Patient Details Name: AMOY STEEVES MRN: [id number] DOB: 1946-05-31   Cancelled Treatment:    Reason Eval/Treat Not Completed: Other (comment) (Pt for vertebroplasty at 1000 today.  Will f/u tomorrow provided pt has activity orders for mobility)   Melvern Banker 02/25/2014, 8:07 AM Lavonia Dana, PT  [phone] 02/25/2014

## 2014-02-25 NOTE — Evaluation (Signed)
Occupational Therapy Evaluation Patient Details Name: [name] MRN: [id number] DOB: 1945/06/19 Today's Date: 02/25/2014    History of Present Illness Past medical history of atrial fibrillation on chronic Coumadin, chronic diastolic heart failure and chronic steroid use secondary to dermatomyositis who one week prior was getting out of her husband's elevated truck which was approximately 2 feet from the ground landing on both feet. Immediately upon landing, she felt something pop in her lower back she started having significant pain. Pain was described as right in the middle of her lower back radiating left and right, X-rays done noted in acute T12 compression fracture with 80% loss. Kyphosplasty performed 02/25/14   Clinical Impression   Pt is currently limited by pain post kyphoplasty for thoracic fracture.  She required mod assist for transfer from supine to sit EOB and then +2 for sit to stand and pivot transfer to the chair.  Pt still with significant pain during the transfer but settled down once sitting supported in the chair.  Mod assist overall for simulated selfcare tasks.  Feel pt will benefit from acute care OT to help increase strength and ADL independence.  MD please clarify if pt needs to wear TLSO that is in her room, there is no order for it at this time.  Feel pt will also need wide 3:1 for home as well as youth wide RW.  May need follow-up short term CIR depending on progress tomorrow with PT eval and OT treatment.  Will update discharge recommendations as needed.     Follow Up Recommendations  Home health OT;Supervision/Assistance - 24 hour    Equipment Recommendations  3 in 1 bedside comode;Other (comment) (wide BSC and wide youth RW)       Precautions / Restrictions Precautions Precautions: Back;Fall Other Brace/Splint: TLSO in room but no orders to wear.  Will need to clarify Restrictions Weight Bearing Restrictions: No      Mobility Bed Mobility Overal bed  mobility: Needs Assistance Bed Mobility: Rolling;Sidelying to Sit;Sit to Sidelying Rolling: Mod assist Sidelying to sit: Mod assist          Transfers Overall transfer level: Needs assistance Equipment used: 2 person hand held assist Transfers: Stand Pivot Transfers   Stand pivot transfers: Mod assist;+2 physical assistance       General transfer comment: Pt with increased pain with all transitional movements including sit to stand and supine to sit.    Balance Overall balance assessment: Needs assistance   Sitting balance-Leahy Scale: Poor   Postural control: Posterior lean   Standing balance-Leahy Scale: Poor                              ADL Overall ADL's : Needs assistance/impaired Eating/Feeding: Independent;Sitting   Grooming: Wash/dry hands;Sitting   Upper Body Bathing: Supervision/ safety;Sitting   Lower Body Bathing: +2 for physical assistance;Moderate assistance;Adhering to back precautions;Sit to/from stand       Lower Body Dressing: Sit to/from stand;Adhering to back precautions;+2 for physical assistance;Maximal assistance   Toilet Transfer: +2 for physical assistance;Stand-pivot;Moderate assistance           Functional mobility during ADLs: Moderate assistance General ADL Comments: Pt and spouse educated on back precautions and pt given demonstrational cueing and mod assist for sequencing through bed mobility.  Once sitting on the EOB pt's pain increased and she became uncomfortable in her back and with the bedrail poking her leg as well.  She stood  up abruptly with +2 assistance from her husband and therapist and stepped to the bedside chair with hand held assist.  Once in the chair pillows were positioned behind her.  Discussed need for AE for LB selfcare.  She states she was already using a sockaide.prior to this and also has a Secondary school teacher.  She will need a wide 3:1 and youth wide RW for discharge.  They reported having a tub bench at one  time but she was not able to really utilize it.         Perception Perception Perception Tested?: No   Praxis Praxis Praxis tested?: Within functional limits    Pertinent Vitals/Pain Pain Assessment: Faces Faces Pain Scale: Hurts even more Pain Location: Minimal pain at rest but increased to 6/10 on the faces scale with sitting EOB and then standing. Pain Intervention(s): Limited activity within patient's tolerance;Monitored during session;Repositioned     Hand Dominance Right   Extremity/Trunk Assessment Upper Extremity Assessment Upper Extremity Assessment: Overall WFL for tasks assessed (Not formally assessed secondary to back precautions)   Lower Extremity Assessment Lower Extremity Assessment: Defer to PT evaluation   Cervical / Trunk Assessment Cervical / Trunk Assessment: Normal   Communication Communication Communication: No difficulties   Cognition Arousal/Alertness: Awake/alert Behavior During Therapy: Anxious Overall Cognitive Status: Within Functional Limits for tasks assessed                                Home Living Family/patient expects to be discharged to:: Private residence Living Arrangements: Spouse/significant other;Other relatives Available Help at Discharge: Family Type of Home: House Home Access: Ramped entrance     Home Layout: One level     Bathroom Shower/Tub: Tub/shower unit Shower/tub characteristics: Architectural technologist: Standard Bathroom Accessibility: Yes How Accessible: Accessible via walker Home Equipment: Walker - standard;Walker - 4 wheels          Prior Functioning/Environment Level of Independence: Independent             OT Diagnosis: Generalized weakness;Acute pain   OT Problem List: Decreased strength;Impaired balance (sitting and/or standing);Pain;Decreased knowledge of use of DME or AE   OT Treatment/Interventions: Self-care/ADL training;Therapeutic exercise;Balance  training;Patient/family education;Therapeutic activities;DME and/or AE instruction    OT Goals(Current goals can be found in the care plan section) Acute Rehab OT Goals Patient Stated Goal: Pt did not state this session. OT Goal Formulation: With patient/family Time For Goal Achievement: 03/04/14 Potential to Achieve Goals: Good  OT Frequency: Min 2X/week              End of Session Nurse Communication: Mobility status  Activity Tolerance: Patient limited by pain;Other (comment) (Limited session to bed to chair) Patient left:     Time: 1525-1610 OT Time Calculation (min): 45 min Charges:  OT General Charges $OT Visit: 1 Procedure OT Evaluation $Initial OT Evaluation Tier I: 1 Procedure OT Treatments $Self Care/Home Management : 23-37 mins  Aamani Moose OTR/L 02/25/2014, 4:38 PM

## 2014-02-25 NOTE — Progress Notes (Signed)
[facility] for coumadin Indication: atrial fibrillation  Allergies  Allergen Reactions  . Butoconazole     'Redness and swelling feverish ' 'worse symptoms '  . Lipitor [Atorvastatin Calcium] Other (See Comments)    'Muscle weakness'      Patient Measurements: Height: 5' (152.4 cm) Weight: 260 lb (117.935 kg) IBW/kg (Calculated) : 45.5 Dosing Weight: 75 kg  Vital Signs: Temp: 97.6 F (36.4 C) (09/15 1114) Temp src: Oral (09/15 1114) BP: 144/68 mmHg (09/15 1114) Pulse Rate: 69 (09/15 1114)  Labs:  Recent Labs  02/23/14 0518 02/24/14 0450  HGB 11.0* 11.1*  HCT 33.3* 33.5*  PLT 112* 119*  LABPROT 18.7* 18.4*  INR 1.56* 1.53*  HEPARINUNFRC 0.58 0.48  CREATININE  --  0.85    Estimated Creatinine Clearance: 75.5 ml/min (by C-G formula based on Cr of 0.85).   Medications:  Scheduled:  . bupivacaine (PF)      . cyclobenzaprine  10 mg Oral BID  . fentaNYL      . HYDROmorphone      . hydroxychloroquine  200 mg Oral BID  . levothyroxine  125 mcg Oral QAC breakfast  . lidocaine  1 patch Transdermal Q24H  . loratadine  10 mg Oral Daily  . methotrexate  12.5 mg Oral Weekly  . metoprolol succinate  25 mg Oral Daily  . midazolam      . pantoprazole  40 mg Oral Daily  . polyethylene glycol  17 g Oral BID  . predniSONE  10 mg Oral Q breakfast  . pseudoephedrine  120 mg Oral BID  . senna-docusate  1 tablet Oral QHS    Assessment: 68 yo female on chronic warfarin therapy for afib. Admitted 9/8 with back pain after an injury 1 week ago.  She is s/p thoraic T12 kypoplasty and will resume coumadin tonight. Home warfarin dose 2.5mg  MWFSS, 5mg  TTh, last taken 9/7  Goal of Therapy:  INR 2-3 Monitor platelets by anticoagulation protocol: Yes    Plan:  Resume coumadin tonight with 5 mg dose Daily PT/INR Monitor for bleeding complications  Excell Seltzer, PharmD  02/25/2014  12:49 PM

## 2014-02-25 NOTE — Progress Notes (Signed)
[facility]                                                                              Patient Demographics  [name], is a 68 y.o. female, DOB - May 21, 1946, TTS:[id number]  Admit date - 02/18/2014   Admitting Physician Annita Brod, MD  Outpatient Primary MD for the patient is Tommy Medal, MD  LOS - 7   Chief Complaint  Patient presents with  . Back Pain      HPI on 02/18/2014 [name] is a 68 y.o. female with a past medical history of atrial fibrillation on chronic coumadin, chronic diastolic heart failure and chronic steroid use secondary to dermatomyositis who one week prior was getting out of her husband's elevated truck which was approximately 2 feet from the ground landing on both feet. Immediately upon landing, she felt something pop in her lower back and she started having significant pain. Pain was described as right in the middle of her lower back radiating left and right, but nowhere else. She had no associated numbness or other symptoms. Positioning sometimes exacerbated her pain over the past week, patient tried to tolerate this, it continued to get worse so she came into the emergency room. X-rays done noted in acute T12 compression fracture with 80% loss. Case discussed with neurosurgery who recommended back brace and no further imaging. Hospitalists were called for further evaluation and admission. Labs were only noteworthy for an elevated INR of 4.15  Interim History Patient was transferred to [facility] for T12 kyphoplasty by IR.  Assessment & Plan   T12 Compression fracture -Likely secondary to mechanical etiology -If she continues to have pain -Initially admitted for conservative treatment however I interventional radiology for kyphoplasty -CT of the spine showed burst fracture with 50% diameter canal stenosis -MRI was unable to be obtained due to patient's body habitus -Patient is planned for T12 kyphoplasty today with IR -She has been off her Coumadin  and on IV heparin for atrial fibrillation -Will consult PT and OT for evaluation and treatment -Continue pain control  Hypothyroidism -TSH elevated at 8.840 -Continue Synthroid 125 mcg daily -Will need to have TSH function followed in 4-6 weeks  Atrial fibrillation -Continue metoprolol for rate control -On admission, INR was supratherapeutic however currently has been transitioned from Coumadin to IV heparin -Will likely need to restart coumadin 24hours after kyphoplasty  Hypertension -Stable, continue metoprolol  Constipation -Continue bowel regimen  Chronic diastolic heart failure -Currently compensated, and stable -BNP was 143.6 upon admission -Continue monitoring daily weights, strict intake and output  Thrombocytopenia -Questionable etiology, no active bleeding -Continue to monitor CBC  Morbid obesity -Should discuss lifestyle modifications including diet and exercise with her primary care physician upon discharge  Bilateral lower extremity edema -Likely secondary to chronic venous stasis complicated by obesity as well as steroids  Coagulopathy -Coumadin currently on hold, INR 1.53  History of dermatomyositis and chronic steroid -Continue current regimen of prednisone, methotrexate, Plaquenil  Code Status: DNR  Family Communication: None at bedside  Disposition Plan: Admitted  Time Spent in minutes   30 minutes  Procedures  T12 kyphoplasty  Consults   Neurosurgery Interventional radiology  DVT Prophylaxis  heparin  Lab Results  Component Value Date   PLT 119* 02/24/2014    Medications  Scheduled Meds: . bupivacaine (PF)      .  ceFAZolin (ANCEF) IV  2 g Intravenous On Call  . cyclobenzaprine  10 mg Oral BID  . fentaNYL      . HYDROmorphone      . hydroxychloroquine  200 mg Oral BID  . levothyroxine  125 mcg Oral QAC breakfast  . lidocaine  1 patch Transdermal Q24H  . loratadine  10 mg Oral Daily  . methotrexate  12.5 mg Oral Weekly  .  metoprolol succinate  25 mg Oral Daily  . midazolam      . pantoprazole  40 mg Oral Daily  . polyethylene glycol  17 g Oral BID  . predniSONE  10 mg Oral Q breakfast  . pseudoephedrine  120 mg Oral BID  . senna-docusate  1 tablet Oral QHS   Continuous Infusions:  PRN Meds:.acetaminophen, acetaminophen, morphine injection, ondansetron (ZOFRAN) IV, ondansetron, oxyCODONE  Antibiotics    Anti-infectives   Start     Dose/Rate Route Frequency Ordered Stop   02/25/14 0930  ceFAZolin (ANCEF) IVPB 2 g/50 mL premix     2 g 100 mL/hr over 30 Minutes Intravenous On call 02/24/14 1318 02/26/14 0930   02/18/14 2200  hydroxychloroquine (PLAQUENIL) tablet 200 mg     200 mg Oral 2 times daily 02/18/14 1844        Subjective:   [name] seen and examined today.  He and is anxious about her upcoming procedure. She currently complains of some back pain. States that overnight she was given oxycodone however she continues to have back pain. She denies any chest pain, shortness of breath, dizziness, headache, abdominal pain.  Objective:   Filed Vitals:   02/24/14 1442 02/24/14 1825 02/24/14 2200 02/25/14 0515  BP: 125/62 106/52 116/63 114/47  Pulse: 71 68 66 74  Temp: 98.3 F (36.8 C) 98.3 F (36.8 C) 98 F (36.7 C) 97.7 F (36.5 C)  TempSrc: Oral Oral Oral Oral  Resp: 18 18 16 17   Height:      Weight:      SpO2: 100% 100% 95% 97%    Wt Readings from Last 3 Encounters:  02/18/14 117.935 kg (260 lb)  12/09/13 117.028 kg (258 lb)  08/26/13 116.121 kg (256 lb)     Intake/Output Summary (Last 24 hours) at 02/25/14 1038 Last data filed at 02/25/14 0925  Gross per 24 hour  Intake 783.01 ml  Output      0 ml  Net 783.01 ml    Exam  General: Well developed, well nourished, NAD, appears stated age  HEENT: NCAT, PERRLA, EOMI, Anicteic Sclera, mucous membranes moist.   Cardiovascular: S1 S2 auscultated, Regular rate and rhythm.  Respiratory: Clear to auscultation bilaterally with  equal chest rise  Abdomen: Soft, obese, nontender, nondistended, + bowel sounds  Extremities: warm dry without cyanosis clubbing.  +edema LE B/L   Neuro: AAOx3, cranial nerves grossly intact. Strength 5/5 in patient's upper and lower extremities bilaterally  Psych: Normal affect and demeanor with intact judgement and insight  Data Review   Micro Results No results found for this or any previous visit (from the past 240 hour(s)).  Radiology Reports Dg Lumbar Spine Complete  02/18/2014   CLINICAL DATA:  Fall.  Low back pain.  EXAM: LUMBAR SPINE - COMPLETE 4+ VIEW  COMPARISON:  None.  FINDINGS: There is a mild levoconvex curve  on the frontal view of the lumbar spine. Large stool burden is incidentally noted. Lumbar vertebral body height is preserved. Intervertebral disc spaces in the lumbar spine are within normal limits. Mild abdominal aortic atherosclerosis.  There is a T12 compression fracture with about 70% loss of anterior vertebral body height and retropulsion. Retropulsion appears to measure about 7 mm. No other compression fractures are identified.  IMPRESSION: 1. T12 compression fracture with 70% loss of anterior vertebral body height and retropulsion. Consider followup MRI for further assessment of the age and central canal compromise. 2. Normal appearance of the lumbar spine for age aside from a mild levoconvex curve.   Electronically Signed   By: Dereck Ligas M.D.   On: 02/18/2014 12:20   Ct Lumbar Spine Wo Contrast  02/20/2014   CLINICAL DATA:  Fall.  Evaluate T12 fracture  EXAM: CT LUMBAR SPINE WITHOUT CONTRAST  TECHNIQUE: Multidetector CT imaging of the lumbar spine was performed without intravenous contrast administration. Multiplanar CT image reconstructions were also generated.  COMPARISON:  Lumbar radiographs 02/18/2014  FINDINGS: Burst fracture T12 involving all 3 columns. Retropulsion of bone into the canal by approximately 7 mm. Minimal kyphosis at the fracture level. This  appears to be a benign fracture.  No other fracture is identified.  No focal bony lesion.  Image quality limited due to morbid obesity. Lower lumbar spine poorly visualized due to patient size.  IMPRESSION: Burst fracture T12. 7 mm retropulsion of bone into the canal causing 50% diameter stenosis of the canal. This appears to be a benign fracture.  Bony detail limited due to patient size.   Electronically Signed   By: Franchot Gallo M.D.   On: 02/20/2014 15:03   Dg Chest Port 1 View  02/24/2014   CLINICAL DATA:  Pre vertebroplasty.  EXAM: PORTABLE CHEST - 1 VIEW  COMPARISON:  03/07/2006  FINDINGS: Lungs are adequately inflated with minimal linear density in the left base likely linear atelectasis or scarring. Cardiomediastinal silhouette and remainder the exam is unremarkable.  IMPRESSION: No active disease.   Electronically Signed   By: Marin Olp M.D.   On: 02/24/2014 17:45   Mr Attempted [name] Report  02/19/2014   This examination belongs to an outside facility and is stored  here for comparison purposes only.  Contact the originating outside  institution for any associated report or interpretation.   CBC  Recent Labs Lab 02/18/14 1331  02/20/14 0535 02/21/14 0432 02/22/14 0440 02/23/14 0518 02/24/14 0450  WBC 6.5  < > 5.3 5.0 6.4 6.9 6.2  HGB 12.1  < > 11.2* 11.5* 11.2* 11.0* 11.1*  HCT 36.7  < > 34.3* 35.9* 35.0* 33.3* 33.5*  PLT 110*  < > 99* 100* 110* 112* 119*  MCV 97.3  < > 97.2 99.4 99.4 96.8 99.4  MCH 32.1  < > 31.7 31.9 31.8 32.0 32.9  MCHC 33.0  < > 32.7 32.0 32.0 33.0 33.1  RDW 17.9*  < > 17.8* 17.7* 17.7* 17.4* 17.6*  LYMPHSABS 1.4  --   --   --   --   --   --   MONOABS 0.8  --   --   --   --   --   --   EOSABS 0.0  --   --   --   --   --   --   BASOSABS 0.0  --   --   --   --   --   --   < > =  values in this interval not displayed.  Chemistries   Recent Labs Lab 02/18/14 1331 02/19/14 0447 02/20/14 0535 02/21/14 0432 02/24/14 0450  NA 140 137 136* 139  141  K 4.1 4.8 4.2 4.2 4.3  CL 106 105 103 105 108  CO2 24 25 25 26 24   GLUCOSE 82 92 76 83 76  BUN 20 20 20 17 13   CREATININE 0.92 0.96 0.95 0.90 0.85  CALCIUM 8.4 8.4 8.2* 8.1* 8.2*  AST 42*  --   --   --   --   ALT 26  --   --   --   --   ALKPHOS 105  --   --   --   --   BILITOT 0.5  --   --   --   --    ------------------------------------------------------------------------------------------------------------------ estimated creatinine clearance is 75.5 ml/min (by C-G formula based on Cr of 0.85). ------------------------------------------------------------------------------------------------------------------ No results found for this basename: HGBA1C,  in the last 72 hours ------------------------------------------------------------------------------------------------------------------ No results found for this basename: CHOL, HDL, LDLCALC, TRIG, CHOLHDL, LDLDIRECT,  in the last 72 hours ------------------------------------------------------------------------------------------------------------------ No results found for this basename: TSH, T4TOTAL, FREET3, T3FREE, THYROIDAB,  in the last 72 hours ------------------------------------------------------------------------------------------------------------------ No results found for this basename: VITAMINB12, FOLATE, FERRITIN, TIBC, IRON, RETICCTPCT,  in the last 72 hours  Coagulation profile  Recent Labs Lab 02/20/14 0535 02/21/14 0432 02/22/14 0440 02/23/14 0518 02/24/14 0450  INR 2.93* 2.09* 1.80* 1.56* 1.53*    No results found for this basename: DDIMER,  in the last 72 hours  Cardiac Enzymes No results found for this basename: CK, CKMB, TROPONINI, MYOGLOBIN,  in the last 168 hours ------------------------------------------------------------------------------------------------------------------ No components found with this basename: POCBNP,     Kahner Yanik D.O. on 02/25/2014 at 10:38 AM  Between 7am to 7pm  - Pager - [phone]  After 7pm go to [email or web address] - password [email or web address]  And look for the night coverage person covering for me after hours  [facility] Office  [phone]

## 2014-02-25 NOTE — Procedures (Signed)
Thoracic T62 KP No complication No blood loss. See complete dictation in [facility].

## 2014-02-26 DIAGNOSIS — T148XXA Other injury of unspecified body region, initial encounter: Secondary | ICD-10-CM

## 2014-02-26 LAB — PROTIME-INR
INR: 1.33 (ref 0.00–1.49)
Prothrombin Time: 16.5 seconds — ABNORMAL HIGH (ref 11.6–15.2)

## 2014-02-26 MED ORDER — WHITE PETROLATUM GEL
Status: AC
  Administered 2014-02-26: 0.2
  Filled 2014-02-26: qty 5

## 2014-02-26 MED ORDER — WARFARIN SODIUM 6 MG PO TABS
6.0000 mg | ORAL_TABLET | Freq: Once | ORAL | Status: AC
  Administered 2014-02-26: 6 mg via ORAL
  Filled 2014-02-26: qty 1

## 2014-02-26 NOTE — Progress Notes (Signed)
[facility] PROGRESS NOTE  [name] KGY:[id number] DOB: Jan 01, 1946 DOA: 02/18/2014 PCP: Tommy Medal, MD  Assessment/Plan:  T12 Compression fracture  -Likely secondary to mechanical etiology  -PT and OT on board - IR consult at and patient is status post T12 KP on 02/25/2014  Hypothyroidism  -TSH elevated at 8.840  -Continue Synthroid 125 mcg daily  -Will need to have TSH function repeated in 4-6 weeks   Atrial fibrillation  -Continue metoprolol for rate control  -Patient had to come off of Coumadin for KP procedure. - Pharmacy on board and managing Coumadin currently  Hypertension  -Stable, continue metoprolol   Constipation  -Continue bowel regimen   Chronic diastolic heart failure  -Stable and currently compensated -BNP was 143.6 upon admission  -Continue monitoring daily weights, strict intake and output   Thrombocytopenia  -Questionable etiology, no active bleeding  -Continue to monitor CBC   Morbid obesity  -Should discuss lifestyle modifications including diet and exercise with her primary care physician upon discharge   Bilateral lower extremity edema  -Likely secondary to chronic venous stasis complicated by obesity as well as steroids   Coagulopathy  -Coumadin per pharmacy  History of dermatomyositis and chronic steroid  -Continue current regimen of prednisone, methotrexate, Plaquenil    Code Status: DO NOT RESUSCITATE Family Communication: Discussed with daughter and patient at bedside Disposition Plan: Per PT recommendations   Consultants:  IR  Procedures:  Please see above  Antibiotics:  On Plaquenil  HPI/Subjective: The patient has no new complaints. Currently feeling better. Questions answered to patient's satisfaction  Objective: Filed Vitals:   02/26/14 1310  BP: 137/50  Pulse: 84  Temp: 98.8 F (37.1 C)  Resp: 16    Intake/Output Summary (Last 24 hours) at 02/26/14 1440 Last data filed at 02/25/14 1752  Gross  per 24 hour  Intake    240 ml  Output    200 ml  Net     40 ml   Filed Weights   02/18/14 1509  Weight: 117.935 kg (260 lb)    Exam:   General:  Patient in no acute distress, alert and awake  Cardiovascular: S1 and S2 present, no murmur  Respiratory: No increased work of breathing, clear to auscultation bilaterally  Abdomen: Soft, obese, nontender  Musculoskeletal: No cyanosis or clubbing   Data Reviewed: Basic Metabolic Panel:  Recent Labs Lab 02/20/14 0535 02/21/14 0432 02/24/14 0450 02/25/14 1150  NA 136* 139 141 141  K 4.2 4.2 4.3 3.9  CL 103 105 108 105  CO2 25 26 24 28   GLUCOSE 76 83 76 89  BUN 20 17 13 14   CREATININE 0.95 0.90 0.85 0.88  CALCIUM 8.2* 8.1* 8.2* 8.3*   Liver Function Tests: No results found for this basename: AST, ALT, ALKPHOS, BILITOT, PROT, ALBUMIN,  in the last 168 hours No results found for this basename: LIPASE, AMYLASE,  in the last 168 hours No results found for this basename: AMMONIA,  in the last 168 hours CBC:  Recent Labs Lab 02/20/14 0535 02/21/14 0432 02/22/14 0440 02/23/14 0518 02/24/14 0450  WBC 5.3 5.0 6.4 6.9 6.2  HGB 11.2* 11.5* 11.2* 11.0* 11.1*  HCT 34.3* 35.9* 35.0* 33.3* 33.5*  MCV 97.2 99.4 99.4 96.8 99.4  PLT 99* 100* 110* 112* 119*   Cardiac Enzymes: No results found for this basename: CKTOTAL, CKMB, CKMBINDEX, TROPONINI,  in the last 168 hours BNP (last 3 results)  Recent Labs  02/18/14 2014  PROBNP 143.6*   CBG: No  results found for this basename: GLUCAP,  in the last 168 hours  No results found for this or any previous visit (from the past 240 hour(s)).   Studies: Dg Chest Port 1 View  02/24/2014   CLINICAL DATA:  Pre vertebroplasty.  EXAM: PORTABLE CHEST - 1 VIEW  COMPARISON:  03/07/2006  FINDINGS: Lungs are adequately inflated with minimal linear density in the left base likely linear atelectasis or scarring. Cardiomediastinal silhouette and remainder the exam is unremarkable.  IMPRESSION:  No active disease.   Electronically Signed   By: Marin Olp M.D.   On: 02/24/2014 17:45   Ir Kypho Vertebral Thoracic Augmentation  02/25/2014   CLINICAL DATA:  Persistent pain from posttraumatic compression fracture deformity of T12.  EXAM: KYPHOPLASTY AT THORACIC T12:  TECHNIQUE: The procedure, risks (including but not limited to bleeding, infection, organ damage), benefits, and alternatives were explained to the patient and family. Questions regarding the procedure were encouraged and answered. The patient understands and consents to the procedure.  The patient was placed prone on the fluoroscopic table. The skin overlying the lower thoracic region was then prepped and draped in the usual sterile fashion. Maximal barrier sterile technique was utilized including caps, mask, sterile gowns, sterile gloves, sterile drape, hand hygiene and skin antiseptic.  Intravenous Fentanyl and Versed were administered as conscious sedation during continuous cardiorespiratory monitoring by the radiology RN, with a total moderate sedation time of23 minutes.  As antibiotic prophylaxis, cefazolin 2 g was ordered pre-procedure and administered intravenously within !one hour! of incision.  The right pedicle at T12 was then infiltrated with 1% lidocaine followed by the advancement of a Kyphon trocar needle through the right pedicle into the posterior one-third. The trocar was removed and the osteo drill was advanced to the anterior third of the vertebral body. The osteo drill was retracted. Through the working cannula, a Kyphon inflatable bone tamp 15 x 3 was advanced and positioned with the distal marker 5 mm from the anterior aspect of the cortex. Crossing of the midline was not seen on the AP projection. At this time, the balloon was expanded using contrast via a Kyphon inflation syringe device via micro tubing.  In similar fashion, the left pedicle was infiltrated with 1% lidocaine followed by the advancement of a second  Kyphon trocar needle through the left pedicle into the posterior third of the vertebral body. The osteo drill was coaxially advanced to the anterior right third. The osteo drill was exchanged for a Kyphon inflatable bone tamp 15x 3, advanced to the 5 mm of the anterior aspect of the cortex. The balloon was then expanded using contrast as above.  Inflations were continued until there was near apposition across the midline and with the superior and the inferior end plates.  At this time, methylmethacrylate mixture was reconstituted in the Kyphon bone mixing device system. This was then loaded into the delivery mechanism, attached to Kyphon bone fillers.  The balloons were deflated and removed followed by the instillation of methylmethacrylate mixture with excellent filling in the AP and lateral projections. No extravasation was noted in the disk spaces or posteriorly into the spinal canal. No epidural venous contamination was seen.  The patient tolerated the procedure well. There were no acute complications. The working cannulae and the bone filler were then retrieved and removed.  IMPRESSION: 1. Status post vertebral body augmentation using balloon kyphoplasty at thoracic T12 as described without event. 2. Per CMS PQRS reporting requirements (PQRS Measure 24): Given the patient's  age of greater than 17 and the fracture site (hip, distal radius, or spine), the patient should be tested for osteoporosis using DXA, and the appropriate treatment considered based on the DXA results.   Electronically Signed   By: Arne Cleveland M.D.   On: 02/25/2014 10:58    Scheduled Meds: . cyclobenzaprine  10 mg Oral BID  . hydroxychloroquine  200 mg Oral BID  . levothyroxine  125 mcg Oral QAC breakfast  . lidocaine  1 patch Transdermal Q24H  . loratadine  10 mg Oral Daily  . methotrexate  12.5 mg Oral Weekly  . metoprolol succinate  25 mg Oral Daily  . pantoprazole  40 mg Oral Daily  . polyethylene glycol  17 g Oral BID  .  predniSONE  10 mg Oral Q breakfast  . pseudoephedrine  120 mg Oral BID  . senna-docusate  1 tablet Oral QHS  . warfarin  6 mg Oral ONCE-1800  . Warfarin - Pharmacist Dosing Inpatient   Does not apply q1800   Continuous Infusions:    Time spent: > 35 minutes    Velvet Bathe  [facility] Pager [phone] If 7PM-7AM, please contact night-coverage at [email or web address], password [facility] 02/26/2014, 2:40 PM  LOS: 8 days

## 2014-02-26 NOTE — ED Provider Notes (Signed)
Medical screening examination/treatment/procedure(s) were conducted as a shared visit with non-physician practitioner(s) and myself.  I personally evaluated the patient during the encounter.   EKG Interpretation   Date/Time:  Tuesday February 18 2014 13:35:43 EDT Ventricular Rate:  74 PR Interval:  153 QRS Duration: 89 QT Interval:  408 QTC Calculation: 453 R Axis:   43 Text Interpretation:  Sinus rhythm Atrial premature complex Low voltage,  precordial leads Borderline T abnormalities, inferior leads Baseline  wander in lead(s) V6 ED PHYSICIAN INTERPRETATION AVAILABLE IN [facility]  [facility] Confirmed by TEST, Record ([id number]) on 02/20/2014 7:16:59 AM      Please see my additional note.  Tanna Furry, MD 02/26/14 [phone]

## 2014-02-26 NOTE — Progress Notes (Signed)
[place] for coumadin Indication: atrial fibrillation  Allergies  Allergen Reactions  . Butoconazole     'Redness and swelling feverish ' 'worse symptoms '  . Lipitor [Atorvastatin Calcium] Other (See Comments)    'Muscle weakness'      Patient Measurements: Height: 5' (152.4 cm) Weight: 260 lb (117.935 kg) IBW/kg (Calculated) : 45.5 Dosing Weight: 75 kg  Vital Signs: Temp: 99.8 F (37.7 C) (09/16 0534) Temp src: Oral (09/16 0534) BP: 122/56 mmHg (09/16 0534) Pulse Rate: 82 (09/16 0534)  Labs:  Recent Labs  02/24/14 0450 02/25/14 1150 02/26/14 0403  HGB 11.1*  --   --   HCT 33.5*  --   --   PLT 119*  --   --   LABPROT 18.4*  --  16.5*  INR 1.53*  --  1.33  HEPARINUNFRC 0.48  --   --   CREATININE 0.85 0.88  --     Estimated Creatinine Clearance: 73 ml/min (by C-G formula based on Cr of 0.88).   Medications:  Scheduled:  . cyclobenzaprine  10 mg Oral BID  . hydroxychloroquine  200 mg Oral BID  . levothyroxine  125 mcg Oral QAC breakfast  . lidocaine  1 patch Transdermal Q24H  . loratadine  10 mg Oral Daily  . methotrexate  12.5 mg Oral Weekly  . metoprolol succinate  25 mg Oral Daily  . pantoprazole  40 mg Oral Daily  . polyethylene glycol  17 g Oral BID  . predniSONE  10 mg Oral Q breakfast  . pseudoephedrine  120 mg Oral BID  . senna-docusate  1 tablet Oral QHS  . Warfarin - Pharmacist Dosing Inpatient   Does not apply q1800    Assessment: 68 yo female on chronic warfarin therapy for afib. Admitted 9/8 with back pain after an injury 1 week ago.  She is s/p thoraic T12 kypoplasty and will resume coumadin 9/15   INR today 1.33 Home warfarin dose 2.5mg  MWFSS, 5mg  TTh, last taken 9/7  Goal of Therapy:  INR 2-3 Monitor platelets by anticoagulation protocol: Yes    Plan:  Coumadin 6 mg today Daily PT/INR Monitor for bleeding complications  Excell Seltzer, PharmD  02/26/2014  9:00 AM

## 2014-02-26 NOTE — Progress Notes (Signed)
Agree with above 

## 2014-02-26 NOTE — Progress Notes (Signed)
Patient ID: [name], female   DOB: 12-23-1945, 68 y.o.   MRN: [id number]   Referring Physician(s): TR[facility]  Subjective: T 12 KP 02/25/14 Pt has done well Less pain today Sat up in chair yesterday Potty chair yesterday Feels like she has pulled muscle from getting out of bed last night  Allergies: Butoconazole and Lipitor  Medications: Prior to Admission medications   Medication Sig Start Date End Date Taking? Authorizing Provider  acetaminophen (TYLENOL) 500 MG tablet Take 1,000 mg by mouth every 6 (six) hours as needed for mild pain.   Yes Historical Provider, MD  Cholecalciferol (VITAMIN D3) 2000 UNITS TABS Take 2,000 Units by mouth daily.    Yes Historical Provider, MD  cyclobenzaprine (FLEXERIL) 10 MG tablet Take 10 mg by mouth 2 (two) times daily.   Yes Historical Provider, MD  furosemide (LASIX) 40 MG tablet Take 40 mg by mouth as needed for fluid.  08/26/13  Yes Scott Joylene Draft, PA-C  hydrocortisone (ANUSOL-HC) 2.5 % rectal cream Place rectally 2 (two) times daily.   Yes Historical Provider, MD  hydroxychloroquine (PLAQUENIL) 200 MG tablet Take 200 mg by mouth 2 (two) times daily.  08/06/10  Yes Historical Provider, MD  levothyroxine (SYNTHROID, LEVOTHROID) 112 MCG tablet Take 112 mcg by mouth daily before breakfast.   Yes Historical Provider, MD  methotrexate (RHEUMATREX) 2.5 MG tablet Take 12.5 mg by mouth once a week.  08/06/10  Yes Historical Provider, MD  metoprolol succinate (TOPROL-XL) 25 MG 24 hr tablet Take 1 tablet by mouth  daily   Yes [name], MD  Multiple Vitamin (MULTIVITAMIN) tablet Take 1 tablet by mouth daily.     Yes Historical Provider, MD  nitroGLYCERIN (NITROSTAT) 0.4 MG SL tablet Place 1 tablet (0.4 mg total) under the tongue every 5 (five) minutes as needed. 08/26/13  Yes Scott T Kathlen Mody, PA-C  pantoprazole (PROTONIX) 40 MG tablet Take 40 mg by mouth daily. 08/09/10  Yes Historical Provider, MD  potassium chloride SA (K-DUR,KLOR-CON) 20 MEQ tablet Take 1  tablet by mouth two  times daily   Yes [name], MD  predniSONE (DELTASONE) 1 MG tablet Take 5 mg by mouth daily.  08/17/10  Yes Historical Provider, MD  traMADol (ULTRAM) 50 MG tablet Take 50 mg by mouth every 6 (six) hours as needed for moderate pain.  08/29/12  Yes Historical Provider, MD  warfarin (COUMADIN) 5 MG tablet Take 2.5-5 mg by mouth daily at 6 PM. 35m on Tuesday and Thursday . 2.5 mg Su,Mo,We,Fr,Sa 07/11/11  Yes TRenella Cunas MD    Review of Systems  Vital Signs: BP 122/56  Pulse 82  Temp(Src) 99.8 F (37.7 C) (Oral)  Resp 16  Ht 5' (1.524 m)  Wt 117.935 kg (260 lb)  BMI 50.78 kg/m2  SpO2 94%  Physical Exam  Constitutional: She appears well-nourished.  Musculoskeletal: Normal range of motion.  T12 KP site clean and dry Sl tender No bleeding FROM    Imaging: Dg Chest Port 1 View  02/24/2014   CLINICAL DATA:  Pre vertebroplasty.  EXAM: PORTABLE CHEST - 1 VIEW  COMPARISON:  03/07/2006  FINDINGS: Lungs are adequately inflated with minimal linear density in the left base likely linear atelectasis or scarring. Cardiomediastinal silhouette and remainder the exam is unremarkable.  IMPRESSION: No active disease.   Electronically Signed   By: DMarin OlpM.D.   On: 02/24/2014 17:45   Ir Kypho Vertebral Thoracic Augmentation  02/25/2014   CLINICAL DATA:  Persistent pain  from posttraumatic compression fracture deformity of T12.  EXAM: KYPHOPLASTY AT THORACIC T12:  TECHNIQUE: The procedure, risks (including but not limited to bleeding, infection, organ damage), benefits, and alternatives were explained to the patient and family. Questions regarding the procedure were encouraged and answered. The patient understands and consents to the procedure.  The patient was placed prone on the fluoroscopic table. The skin overlying the lower thoracic region was then prepped and draped in the usual sterile fashion. Maximal barrier sterile technique was utilized including caps, mask, sterile  gowns, sterile gloves, sterile drape, hand hygiene and skin antiseptic.  Intravenous Fentanyl and Versed were administered as conscious sedation during continuous cardiorespiratory monitoring by the radiology RN, with a total moderate sedation time of23 minutes.  As antibiotic prophylaxis, cefazolin 2 g was ordered pre-procedure and administered intravenously within !one hour! of incision.  The right pedicle at T12 was then infiltrated with 1% lidocaine followed by the advancement of a Kyphon trocar needle through the right pedicle into the posterior one-third. The trocar was removed and the osteo drill was advanced to the anterior third of the vertebral body. The osteo drill was retracted. Through the working cannula, a Kyphon inflatable bone tamp 15 x 3 was advanced and positioned with the distal marker 5 mm from the anterior aspect of the cortex. Crossing of the midline was not seen on the AP projection. At this time, the balloon was expanded using contrast via a Kyphon inflation syringe device via micro tubing.  In similar fashion, the left pedicle was infiltrated with 1% lidocaine followed by the advancement of a second Kyphon trocar needle through the left pedicle into the posterior third of the vertebral body. The osteo drill was coaxially advanced to the anterior right third. The osteo drill was exchanged for a Kyphon inflatable bone tamp 15x 3, advanced to the 5 mm of the anterior aspect of the cortex. The balloon was then expanded using contrast as above.  Inflations were continued until there was near apposition across the midline and with the superior and the inferior end plates.  At this time, methylmethacrylate mixture was reconstituted in the Kyphon bone mixing device system. This was then loaded into the delivery mechanism, attached to Kyphon bone fillers.  The balloons were deflated and removed followed by the instillation of methylmethacrylate mixture with excellent filling in the AP and lateral  projections. No extravasation was noted in the disk spaces or posteriorly into the spinal canal. No epidural venous contamination was seen.  The patient tolerated the procedure well. There were no acute complications. The working cannulae and the bone filler were then retrieved and removed.  IMPRESSION: 1. Status post vertebral body augmentation using balloon kyphoplasty at thoracic T12 as described without event. 2. Per CMS PQRS reporting requirements (PQRS Measure 24): Given the patient's age of greater than 50 and the fracture site (hip, distal radius, or spine), the patient should be tested for osteoporosis using DXA, and the appropriate treatment considered based on the DXA results.   Electronically Signed   By: Oley Balm M.D.   On: 02/25/2014 10:58    Labs: Results for orders placed during the hospital encounter of 02/18/14 (from the past 48 hour(s))  BASIC METABOLIC PANEL     Status: Abnormal   Collection Time    02/25/14 11:50 AM      Result Value Ref Range   Sodium 141  137 - 147 mEq/L   Potassium 3.9  3.7 - 5.3 mEq/L   Chloride 105  96 - 112 mEq/L   CO2 28  19 - 32 mEq/L   Glucose, Bld 89  70 - 99 mg/dL   BUN 14  6 - 23 mg/dL   Creatinine, Ser 0.88  0.50 - 1.10 mg/dL   Calcium 8.3 (*) 8.4 - 10.5 mg/dL   GFR calc non Af Amer 66 (*) >90 mL/min   GFR calc Af Amer 77 (*) >90 mL/min   Comment: (NOTE)     The eGFR has been calculated using the CKD EPI equation.     This calculation has not been validated in all clinical situations.     eGFR's persistently <90 mL/min signify possible Chronic Kidney     Disease.   Anion gap 8  5 - 15  PROTIME-INR     Status: Abnormal   Collection Time    02/26/14  4:03 AM      Result Value Ref Range   Prothrombin Time 16.5 (*) 11.6 - 15.2 seconds   INR 1.33  0.00 - 1.49    Assessment and Plan:  T 12 KP performed in IR 9/15 Has done well Less pain Rec: use of walker for few weeks  Lavonia Drafts PAC-IR   I spent a total of 20  minutes face to face in clinical consultation/evaluation, greater than 50% of which was counseling/coordinating care for T 12 KP

## 2014-02-26 NOTE — Progress Notes (Signed)
Physical Therapy Treatment Patient Details Name: [name] MRN: [id number] DOB: [birth year] Today's Date: 02/26/2014    History of Present Illness Pt s/p kyphoplasty 9/15 for T12 burst fracture. No TLSO per Jannifer Franklin, interventional radiology.    PT Comments    Per RN who spoke with Jannifer Franklin from IR pt no longer needs TLSO brace. Pt with flat affect and irritated with having to amb. Pt did tolerate well despite required max encouragement. Pt will need a pediatric RW due to height of 4'11 and spouse is avail 24/7 to assist pt. Suspect pt to be able to go home with these recommendations once medically cleared.   Follow Up Recommendations  Home health PT;Supervision/Assistance - 24 hour     Equipment Recommendations  Rolling walker with 5" wheels (needs to pediatric due to pt 4'11")    Recommendations for Other Services       Precautions / Restrictions Precautions Precautions: Back;Fall Precaution Booklet Issued: No Precaution Comments: per Jannifer Franklin from IR (who did kyphoplasty) pt no longer needs brace Restrictions Weight Bearing Restrictions: No    Mobility  Bed Mobility Overal bed mobility:  (pt up in chair)             General bed mobility comments: discussed with pt/spouse/RN staff how to log roll and transition sidelying to sit to protect back  Transfers Overall transfer level: Needs assistance Equipment used: Rolling walker (2 wheeled) Transfers: Sit to/from Stand Sit to Stand: Min assist         General transfer comment: v/c's to push up from chair not pull on walker. increased time   Ambulation/Gait Ambulation/Gait assistance: Min assist (2nd person for chair follow due to first time ambulating) Ambulation Distance (Feet): 40 Feet Assistive device: Rolling walker (2 wheeled) Gait Pattern/deviations: Step-through pattern;Decreased stride length;Wide base of support Gait velocity: slow   General Gait Details: pt with wide base of support, waddle  type gait pattern due to body habitus   Stairs            Wheelchair Mobility    Modified Rankin (Stroke Patients Only)       Balance             Standing balance-Leahy Scale: Fair                      Cognition Arousal/Alertness: Awake/alert Behavior During Therapy: Flat affect Overall Cognitive Status: Within Functional Limits for tasks assessed                      Exercises      General Comments        Pertinent Vitals/Pain Pain Assessment: 0-10 Pain Score: 6  Pain Location: back    Home Living                      Prior Function            PT Goals (current goals can now be found in the care plan section) Progress towards PT goals: Progressing toward goals    Frequency  Min 5X/week    PT Plan Frequency needs to be updated    Co-evaluation             End of Session Equipment Utilized During Treatment: Gait belt Activity Tolerance: Patient tolerated treatment well Patient left: in chair;with call bell/phone within reach;with family/visitor present     Time: 3810-1751 PT Time Calculation (min): 19 min  Charges:  $Gait Training: 8-22 mins                    G Codes:      Kingsley Callander 02/26/2014, 4:28 PM  Kittie Plater, PT, DPT Pager #: [phone] Office #: [phone]

## 2014-02-26 NOTE — Clinical Social Work Note (Signed)
CSW spoke with patient and patients husband at bedside.  Patient stated that she had previously met with a social work while at [facility].  Patient was provided choice at that facility and made the decision to go to [facility].  CSW will continue to follow.  Domenica Reamer, [place] Social Worker [phone]

## 2014-02-27 LAB — PROTIME-INR
INR: 1.6 — ABNORMAL HIGH (ref 0.00–1.49)
Prothrombin Time: 19.1 seconds — ABNORMAL HIGH (ref 11.6–15.2)

## 2014-02-27 MED ORDER — WARFARIN SODIUM 5 MG PO TABS
5.0000 mg | ORAL_TABLET | Freq: Once | ORAL | Status: AC
  Administered 2014-02-27: 5 mg via ORAL
  Filled 2014-02-27: qty 1

## 2014-02-27 NOTE — Clinical Social Work Note (Signed)
Patient is now going home with home health.  CSW signing off.  Domenica Reamer, [place] Social Worker [phone]

## 2014-02-27 NOTE — Progress Notes (Signed)
Physical Therapy Treatment Patient Details Name: [name] MRN: [id number] DOB: Oct 23, 1945 Today's Date: 02/27/2014    History of Present Illness Pt s/p kyphoplasty 9/15. No TLSO per Jannifer Franklin, radiology.    PT Comments    Pt progressing slowly but steadily with therapy and mobility. Has difficulty when standing because she begins to urinate uncontrollably. Pt at risk for skin breakdown due to incontinence; pt and husband educated on this risk. Will cont to follow per POC. Pt planning to D/C home with husband.   Follow Up Recommendations  Home health PT;Supervision/Assistance - 24 hour     Equipment Recommendations  Rolling walker with 5" wheels (if pt is allowed to be ambulatory)    Recommendations for Other Services       Precautions / Restrictions Precautions Precautions: Back;Fall Precaution Booklet Issued: Yes (comment) Precaution Comments: pt given handout a Required Braces or Orthoses: Spinal Brace Other Brace/Splint: recent note from previous PT to D/C TLSO Restrictions Weight Bearing Restrictions: No    Mobility  Bed Mobility               General bed mobility comments: pt up in chair   Transfers Overall transfer level: Needs assistance Equipment used: Rolling walker (2 wheeled) Transfers: Sit to/from Stand Sit to Stand: Min assist         General transfer comment: performed sit to stand x 3; begins to urinate uncontrollably; cues for hand placment and sequencing   Ambulation/Gait Ambulation/Gait assistance: Min assist Ambulation Distance (Feet): 50 Feet Assistive device: Rolling walker (2 wheeled) Gait Pattern/deviations: Step-through pattern;Wide base of support;Decreased stride length Gait velocity: slow Gait velocity interpretation: Below normal speed for age/gender General Gait Details: pt continues to have waddle type gt; c/o pain in low back with mobility; no noted LE buckling; fatigues quickly ; requires incr time due to fatigue     Stairs            Wheelchair Mobility    Modified Rankin (Stroke Patients Only)       Balance Overall balance assessment: Needs assistance         Standing balance support: During functional activity;Bilateral upper extremity supported Standing balance-Leahy Scale: Poor Standing balance comment: relies heavily on RW for UE support                    Cognition Arousal/Alertness: Awake/alert Behavior During Therapy: Flat affect Overall Cognitive Status: Within Functional Limits for tasks assessed       Memory: Decreased recall of precautions              Exercises General Exercises - Lower Extremity Ankle Circles/Pumps: AROM;Both;10 reps;Seated    General Comments General comments (skin integrity, edema, etc.): pt at risk for skin breakdown due to incontinence       Pertinent Vitals/Pain Pain Assessment: 0-10 Pain Score: 3  Pain Location: low back Pain Descriptors / Indicators: Constant Pain Intervention(s): Monitored during session;Limited activity within patient's tolerance;Premedicated before session;Repositioned    Home Living                      Prior Function            PT Goals (current goals can now be found in the care plan section) Acute Rehab PT Goals Patient Stated Goal: to go home and not to rehab PT Goal Formulation: With patient/family Time For Goal Achievement: 02/28/14 Potential to Achieve Goals: Good Progress towards PT goals: Progressing toward goals  Frequency  Min 5X/week    PT Plan Current plan remains appropriate    Co-evaluation             End of Session Equipment Utilized During Treatment: Gait belt Activity Tolerance: Patient limited by fatigue Patient left: in chair;with call bell/phone within reach;with family/visitor present     Time: 0254-2706 PT Time Calculation (min): 30 min  Charges:  $Gait Training: 8-22 mins $Therapeutic Activity: 8-22 mins                    G  CodesGustavus [name], [name] [name] 02/27/2014, 4:35 PM

## 2014-02-27 NOTE — Progress Notes (Signed)
Groin and peri anal area red and excoriated. Patient said she has this on and off.

## 2014-02-27 NOTE — Progress Notes (Signed)
[place] for coumadin Indication: atrial fibrillation  Allergies  Allergen Reactions  . Butoconazole     'Redness and swelling feverish ' 'worse symptoms '  . Lipitor [Atorvastatin Calcium] Other (See Comments)    'Muscle weakness'      Patient Measurements: Height: 5' (152.4 cm) Weight: 260 lb (117.935 kg) IBW/kg (Calculated) : 45.5 Dosing Weight: 75 kg  Vital Signs: Temp: 97.9 F (36.6 C) (09/17 0945) Temp src: Oral (09/17 0945) BP: 109/50 mmHg (09/17 0945) Pulse Rate: 83 (09/17 0945)  Labs:  Recent Labs  02/25/14 1150 02/26/14 0403 02/27/14 0405  LABPROT  --  16.5* 19.1*  INR  --  1.33 1.60*  CREATININE 0.88  --   --     Estimated Creatinine Clearance: 73 ml/min (by C-G formula based on Cr of 0.88).   Medications:  Scheduled:  . cyclobenzaprine  10 mg Oral BID  . hydroxychloroquine  200 mg Oral BID  . levothyroxine  125 mcg Oral QAC breakfast  . lidocaine  1 patch Transdermal Q24H  . loratadine  10 mg Oral Daily  . methotrexate  12.5 mg Oral Weekly  . metoprolol succinate  25 mg Oral Daily  . pantoprazole  40 mg Oral Daily  . polyethylene glycol  17 g Oral BID  . predniSONE  10 mg Oral Q breakfast  . pseudoephedrine  120 mg Oral BID  . senna-docusate  1 tablet Oral QHS  . Warfarin - Pharmacist Dosing Inpatient   Does not apply q1800    Assessment: 68 yo female on chronic warfarin therapy for afib. Admitted 9/8 with back pain after an injury 1 week ago.  She is s/p thoraic T12 kypoplasty and will resume coumadin 9/15   INR today 1.6 Home warfarin dose 2.5mg  MWFSS, 5mg  TTh, last taken 9/7  Goal of Therapy:  INR 2-3 Monitor platelets by anticoagulation protocol: Yes    Plan:  Coumadin 5mg  today Daily PT/INR Monitor for bleeding complications  Excell Seltzer, PharmD  02/27/2014  10:02 AM

## 2014-02-27 NOTE — Progress Notes (Signed)
[facility] PROGRESS NOTE  [name] BOF:[id number] DOB: 16-Apr-1946 DOA: 02/18/2014 PCP: Tommy Medal, MD  Assessment/Plan:  T12 Compression fracture  -Likely secondary to mechanical etiology  -PT and OT on board - IR consulted and patient is status post T12 KP on 02/25/2014  Hypothyroidism  -TSH elevated at 8.840  -Continue Synthroid 125 mcg daily  -Will need to have TSH function repeated in 4-6 weeks   Atrial fibrillation  -Continue metoprolol for rate control  -Patient had to come off of Coumadin for KP procedure. - Pharmacy on board and managing Coumadin currently. INR rising and on last check at 1.6  Hypertension  -Stable, continue metoprolol   Constipation  -Continue bowel regimen   Chronic diastolic heart failure  - Stable and currently compensated - BNP was 143.6 upon admission  - Continue monitoring daily weights, strict intake and output   Thrombocytopenia  - Questionable etiology, no active bleeding  - Continue to monitor CBC   Morbid obesity  - Should discuss lifestyle modifications including diet and exercise with her primary care physician upon discharge   Bilateral lower extremity edema  - Likely secondary to chronic venous stasis complicated by obesity as well as steroids   Coagulopathy  - Coumadin per pharmacy  History of dermatomyositis and chronic steroid  -Continue current regimen of prednisone, methotrexate, Plaquenil    Code Status: DO NOT RESUSCITATE Family Communication: Discussed with patient at bedside Disposition Plan: Per PT recommendations   Consultants:  IR  Procedures:  Please see above  Antibiotics:  On Plaquenil  HPI/Subjective: The patient has no new complaints. Currently feeling better. Questions answered to patient's satisfaction  Objective: Filed Vitals:   02/27/14 1108  BP: 129/43  Pulse: 65  Temp:   Resp:     Intake/Output Summary (Last 24 hours) at 02/27/14 1216 Last data filed at 02/27/14  7782  Gross per 24 hour  Intake    800 ml  Output      0 ml  Net    800 ml   Filed Weights   02/18/14 1509  Weight: 117.935 kg (260 lb)    Exam:   General:  Patient in no acute distress, alert and awake  Cardiovascular: S1 and S2 present, no murmur  Respiratory: No increased work of breathing, clear to auscultation bilaterally  Abdomen: Soft, obese, nontender  Musculoskeletal: No cyanosis or clubbing   Data Reviewed: Basic Metabolic Panel:  Recent Labs Lab 02/21/14 0432 02/24/14 0450 02/25/14 1150  NA 139 141 141  K 4.2 4.3 3.9  CL 105 108 105  CO2 26 24 28   GLUCOSE 83 76 89  BUN 17 13 14   CREATININE 0.90 0.85 0.88  CALCIUM 8.1* 8.2* 8.3*   Liver Function Tests: No results found for this basename: AST, ALT, ALKPHOS, BILITOT, PROT, ALBUMIN,  in the last 168 hours No results found for this basename: LIPASE, AMYLASE,  in the last 168 hours No results found for this basename: AMMONIA,  in the last 168 hours CBC:  Recent Labs Lab 02/21/14 0432 02/22/14 0440 02/23/14 0518 02/24/14 0450  WBC 5.0 6.4 6.9 6.2  HGB 11.5* 11.2* 11.0* 11.1*  HCT 35.9* 35.0* 33.3* 33.5*  MCV 99.4 99.4 96.8 99.4  PLT 100* 110* 112* 119*   Cardiac Enzymes: No results found for this basename: CKTOTAL, CKMB, CKMBINDEX, TROPONINI,  in the last 168 hours BNP (last 3 results)  Recent Labs  02/18/14 2014  PROBNP 143.6*   CBG: No results found for this basename: GLUCAP,  in the last 168 hours  No results found for this or any previous visit (from the past 240 hour(s)).   Studies: No results found.  Scheduled Meds: . cyclobenzaprine  10 mg Oral BID  . hydroxychloroquine  200 mg Oral BID  . levothyroxine  125 mcg Oral QAC breakfast  . lidocaine  1 patch Transdermal Q24H  . loratadine  10 mg Oral Daily  . methotrexate  12.5 mg Oral Weekly  . metoprolol succinate  25 mg Oral Daily  . pantoprazole  40 mg Oral Daily  . polyethylene glycol  17 g Oral BID  . predniSONE  10 mg  Oral Q breakfast  . pseudoephedrine  120 mg Oral BID  . senna-docusate  1 tablet Oral QHS  . warfarin  5 mg Oral ONCE-1800  . Warfarin - Pharmacist Dosing Inpatient   Does not apply q1800   Continuous Infusions:    Time spent: > 35 minutes    Velvet Bathe  [facility] Pager [phone] If 7PM-7AM, please contact night-coverage at [email or web address], password [facility] 02/27/2014, 12:16 PM  LOS: 9 days

## 2014-02-28 LAB — PROTIME-INR
INR: 1.96 — ABNORMAL HIGH (ref 0.00–1.49)
Prothrombin Time: 22.3 seconds — ABNORMAL HIGH (ref 11.6–15.2)

## 2014-02-28 MED ORDER — WARFARIN SODIUM 2.5 MG PO TABS
2.5000 mg | ORAL_TABLET | Freq: Once | ORAL | Status: AC
  Administered 2014-02-28: 2.5 mg via ORAL
  Filled 2014-02-28: qty 1

## 2014-02-28 NOTE — Progress Notes (Signed)
Occupational Therapy Treatment Patient Details Name: [name] MRN: [id number] DOB: 1945/09/17 Today's Date: 02/28/2014    History of present illness 68 y.o. female admitted to [facility] on 02/18/14 with mid back pain and pop after getting out of an elevated truck.  X rays revealed a T12 compression fx with 80% loss of height.  Pt is now s/p T12 kyphoplasty on 02/25/14   OT comments  Pt seen today for bed mobility and ADLs. Pt with improved mobility and plans to d/c home with family support. Pt will continue to benefit from acute OT and HHOT to advance her independence.   Follow Up Recommendations  Home health OT;Supervision/Assistance - 24 hour    Equipment Recommendations  3 in 1 bedside comode;Other (comment)    Recommendations for Other Services      Precautions / Restrictions Precautions Precautions: Back;Fall Precaution Booklet Issued: Yes (comment) Precaution Comments: reviewed back precautions and handout.  Required Braces or Orthoses: Spinal Brace Spinal Brace: Thoracolumbosacral orthotic (per last two therapy notes, pt does not have to wear TLSO. ) Other Brace/Splint: recent note from previous PT to D/C TLSO Restrictions Weight Bearing Restrictions: No       Mobility Bed Mobility Overal bed mobility: Needs Assistance Bed Mobility: Rolling;Sidelying to Sit;Sit to Sidelying Rolling: Supervision Sidelying to sit: Supervision     Sit to sidelying: Min assist General bed mobility comments: Pt required only min A to manage Bil LEs onto bed safely during sit>sidelying but otherwise supervision for bed mobility.  HOB flat, no use of hand rails  Transfers Overall transfer level: Needs assistance Equipment used: Rolling walker (2 wheeled) Transfers: Sit to/from Stand Sit to Stand: Supervision         General transfer comment: Supervision for safety and VC's for hand placement.         ADL Overall ADL's : Needs assistance/impaired     Grooming:  Supervision/safety;Standing                   Toilet Transfer: Supervision/safety;Ambulation;RW           Functional mobility during ADLs: Supervision/safety;Rolling walker General ADL Comments: Reinforced education on precautions and incorporating into ADLs. Pt practiced bed mobility with simulation of home environment.                 Cognition  Arousal/Alertness: Awake/Alert Behavior During Therapy: WFL for tasks assessed/performed Overall Cognitive Status: Within Functional Limits for tasks assessed                                    Pertinent Vitals/ Pain       Pain Assessment: 0-10 Pain Score: 3  Pain Location: mid to low back Pain Descriptors / Indicators: Aching Pain Intervention(s): Monitored during session;Repositioned         Frequency Min 2X/week     Progress Toward Goals  OT Goals(current goals can now be found in the care plan section)  Progress towards OT goals: Progressing toward goals  Acute Rehab OT Goals Patient Stated Goal: to go home with Fritz Pickerel, her husband.  OT Goal Formulation: With patient/family Time For Goal Achievement: 03/04/14 Potential to Achieve Goals: Good ADL Goals Pt Will Perform Lower Body Bathing: with supervision;sit to/from stand;with adaptive equipment Pt Will Perform Lower Body Dressing: with supervision;sit to/from stand;with adaptive equipment Pt Will Perform Toileting - Clothing Manipulation and hygiene: with supervision;sit to/from stand;with adaptive equipment Additional ADL  Goal #1: Pt will state 3 back precautions for selfcare tasks.   Plan Discharge plan remains appropriate       End of Session Equipment Utilized During Treatment: Gait belt;Rolling walker   Activity Tolerance Patient tolerated treatment well   Patient Left in chair;with call bell/phone within reach;with family/visitor present   Nurse Communication          Time: 1062-6948 OT Time Calculation (min): 23 min  Charges:  OT General Charges $OT Visit: 1 Procedure OT Treatments $Self Care/Home Management : 23-37 mins  Juluis Rainier [phone] 02/28/2014, 5:42 PM

## 2014-02-28 NOTE — Consult Note (Addendum)
WOC note: WOC assistance requested for use of Interdry.  Discussed patient via phone with bedside nurse; pt with high BMI.  He describes skin folds beneath breasts and abd folds as being red, macerated, and moist with partial thickness skin loss.  Appearance is consistent with intertrigo.  Interdry silver-impregnated fabric ordered for use by bedside nurses and instructions provided.  This product should remain in place for 5 days for optimal plan of care to provide antimicrobial benefits and wick moisture away from skin.  Please re-consult if further assistance is needed.  Thank-you,  Julien Girt MSN, [place], [place], [place], [place]

## 2014-02-28 NOTE — Progress Notes (Signed)
[facility] PROGRESS NOTE  [name] HDQ:[id number] DOB: 03-25-1946 DOA: 02/18/2014 PCP: Tommy Medal, MD  Assessment/Plan:  T12 Compression fracture  -Likely secondary to mechanical etiology  -PT and OT on board - IR consulted and patient is status post T12 KP on 02/25/2014  Hypothyroidism  -TSH elevated at 8.840  -Continue Synthroid 125 mcg daily  -Will need to have TSH function repeated in 4-6 weeks   Atrial fibrillation  -Continue metoprolol for rate control  -Patient had to come off of Coumadin for KP procedure. - Pharmacy on board and managing Coumadin currently. INR rising and on last check at 1.9. As such near target but not quit there. Will monitor and make sure that INR trends up and if at target will plan discharging next am.  Hypertension  -Stable, continue metoprolol   Constipation  -Continue bowel regimen   Chronic diastolic heart failure  - Stable and currently compensated - BNP was 143.6 upon admission  - Continue monitoring daily weights, strict intake and output   Thrombocytopenia  - Questionable etiology, no active bleeding  - Continue to monitor CBC   Morbid obesity  - Agree that patient should discuss lifestyle modifications including diet and exercise with her primary care physician upon discharge   Bilateral lower extremity edema  - Likely secondary to chronic venous stasis complicated by obesity as well as steroids   Coagulopathy  - Coumadin per pharmacy  History of dermatomyositis and chronic steroid  -Continue current regimen of prednisone, methotrexate, Plaquenil    Code Status: DO NOT RESUSCITATE Family Communication: Discussed with patient at bedside Disposition Plan: Per PT pt for home with home health   Consultants:  IR  Procedures:  Please see above  Antibiotics:  On Plaquenil  HPI/Subjective: The patient has no new complaints. Currently feeling better. Denies any chest pain, focal neurological weaknesses    Objective: Filed Vitals:   02/28/14 0517  BP: 112/39  Pulse: 71  Temp: 97.5 F (36.4 C)  Resp: 16    Intake/Output Summary (Last 24 hours) at 02/28/14 1344 Last data filed at 02/28/14 1018  Gross per 24 hour  Intake    942 ml  Output      0 ml  Net    942 ml   Filed Weights   02/18/14 1509  Weight: 117.935 kg (260 lb)    Exam:   General:  Patient in no acute distress, alert and awake  Cardiovascular: S1 and S2 present, no murmur  Respiratory: No increased work of breathing, clear to auscultation bilaterally  Abdomen: Soft, obese, nontender  Musculoskeletal: No cyanosis or clubbing   Data Reviewed: Basic Metabolic Panel:  Recent Labs Lab 02/24/14 0450 02/25/14 1150  NA 141 141  K 4.3 3.9  CL 108 105  CO2 24 28  GLUCOSE 76 89  BUN 13 14  CREATININE 0.85 0.88  CALCIUM 8.2* 8.3*   Liver Function Tests: No results found for this basename: AST, ALT, ALKPHOS, BILITOT, PROT, ALBUMIN,  in the last 168 hours No results found for this basename: LIPASE, AMYLASE,  in the last 168 hours No results found for this basename: AMMONIA,  in the last 168 hours CBC:  Recent Labs Lab 02/22/14 0440 02/23/14 0518 02/24/14 0450  WBC 6.4 6.9 6.2  HGB 11.2* 11.0* 11.1*  HCT 35.0* 33.3* 33.5*  MCV 99.4 96.8 99.4  PLT 110* 112* 119*   Cardiac Enzymes: No results found for this basename: CKTOTAL, CKMB, CKMBINDEX, TROPONINI,  in the last 168 hours  BNP (last 3 results)  Recent Labs  02/18/14 2014  PROBNP 143.6*   CBG: No results found for this basename: GLUCAP,  in the last 168 hours  No results found for this or any previous visit (from the past 240 hour(s)).   Studies: No results found.  Scheduled Meds: . cyclobenzaprine  10 mg Oral BID  . hydroxychloroquine  200 mg Oral BID  . levothyroxine  125 mcg Oral QAC breakfast  . lidocaine  1 patch Transdermal Q24H  . loratadine  10 mg Oral Daily  . methotrexate  12.5 mg Oral Weekly  . metoprolol succinate  25  mg Oral Daily  . pantoprazole  40 mg Oral Daily  . polyethylene glycol  17 g Oral BID  . predniSONE  10 mg Oral Q breakfast  . pseudoephedrine  120 mg Oral BID  . senna-docusate  1 tablet Oral QHS  . warfarin  2.5 mg Oral ONCE-1800  . Warfarin - Pharmacist Dosing Inpatient   Does not apply q1800   Continuous Infusions:    Time spent: > 35 minutes    Velvet Bathe  [facility] Pager [phone] If 7PM-7AM, please contact night-coverage at [email or web address], password [facility] 02/28/2014, 1:44 PM  LOS: 10 days

## 2014-02-28 NOTE — Progress Notes (Signed)
Physical Therapy Treatment Patient Details Name: [name] MRN: [id number] DOB: Apr 10, 1946 Today's Date: 02/28/2014    History of Present Illness 68 y.o. female admitted to [facility] on 02/18/14 with mid back pain and pop after getting out of an elevated truck.  X rays revealed a T12 compression fx with 80% loss of height.  Pt is now s/p T12 kyphoplasty on 02/25/14    PT Comments    Pt is progressing well with her mobility and gait.  She and her husband are comfortable with going home with HHPT when the MDs deem she is ready.  She continues to need encouragement to increase her activity level, but she was very sedentary PTA.  HHPT remains appropriate at d/c.    Follow Up Recommendations  Home health PT;Supervision/Assistance - 24 hour     Equipment Recommendations  Rolling walker with 5" wheels;3in1 (PT) (short, bari RW, bari BSC)    Recommendations for Other Services    NA     Precautions / Restrictions Precautions Precautions: Back;Fall Precaution Booklet Issued: Yes (comment) Precaution Comments: reviewed back precautions and handout.  Required Braces or Orthoses: Spinal Brace Spinal Brace: Thoracolumbosacral orthotic (per last two therapy notes, pt does not have to wear TLSO. )    Mobility   Transfers   Equipment used: Rolling walker (2 wheeled) Transfers: Sit to/from Stand Sit to Stand: Min guard         General transfer comment: Min guard assist for safety during transitions as pt relies heavily on upper extremity support to get to standing and control descent to sitting.   Ambulation/Gait Ambulation/Gait assistance: Min guard Ambulation Distance (Feet): 75 Feet Assistive device: Rolling walker (2 wheeled) Gait Pattern/deviations: Step-through pattern;Wide base of support Gait velocity: decreased Gait velocity interpretation: Below normal speed for age/gender General Gait Details: Pt with swaying gait, but this is likely due to body habitus.  She needs a shorter,  wider RW for use at home.           Balance Overall balance assessment: Needs assistance Sitting-balance support: Feet supported;No upper extremity supported Sitting balance-Leahy Scale: Good     Standing balance support: Bilateral upper extremity supported Standing balance-Leahy Scale: Fair Standing balance comment: pt able to preform some tasks in standing.  Total assist for peri care.                      Cognition Arousal/Alertness: Awake/alert Behavior During Therapy: WFL for tasks assessed/performed Overall Cognitive Status: Within Functional Limits for tasks assessed                         General Comments General comments (skin integrity, edema, etc.): Verbally reviewed no lifting, walking as her main form of activity at home three times per day, and verbally reviewed and demonstrated how would be safest to get into the car.       Pertinent Vitals/Pain Pain Assessment: 0-10 Pain Score: 4  Pain Location: left side mid to low back.  Pain Descriptors / Indicators: Aching Pain Intervention(s): Limited activity within patient's tolerance;Monitored during session;Repositioned           PT Goals (current goals can now be found in the care plan section) Acute Rehab PT Goals Patient Stated Goal: to go home with [name], her husband.  Progress towards PT goals: Goals met and updated - see care plan    Frequency  Min 5X/week    PT Plan Current plan remains  appropriate       End of Session   Activity Tolerance: Patient limited by pain Patient left: in chair;with call bell/phone within reach;with family/visitor present     Time: 2481-8590 PT Time Calculation (min): 31 min  Charges:  $Gait Training: 8-22 mins $Self Care/Home Management: 8-22                     [place] Vanaken B. [place], [place], DPT [phone]   02/28/2014, 3:32 PM

## 2014-02-28 NOTE — Progress Notes (Signed)
[place] for coumadin Indication: atrial fibrillation  Allergies  Allergen Reactions  . Butoconazole     'Redness and swelling feverish ' 'worse symptoms '  . Lipitor [Atorvastatin Calcium] Other (See Comments)    'Muscle weakness'      Patient Measurements: Height: 5' (152.4 cm) Weight: 260 lb (117.935 kg) IBW/kg (Calculated) : 45.5 Dosing Weight: 75 kg  Vital Signs: Temp: 97.5 F (36.4 C) (09/18 0517) Temp src: Oral (09/18 0517) BP: 112/39 mmHg (09/18 0517) Pulse Rate: 71 (09/18 0517)  Labs:  Recent Labs  02/26/14 0403 02/27/14 0405 02/28/14 0430  LABPROT 16.5* 19.1* 22.3*  INR 1.33 1.60* 1.96*    Estimated Creatinine Clearance: 73 ml/min (by C-G formula based on Cr of 0.88).   Assessment: 68 yo female on chronic warfarin therapy for afib. Admitted 9/8 with back pain after an injury 1 week ago.  She is s/p thoraic T12 kypoplasty. Coumadin resumed 9/15   INR today 1.96 Home warfarin dose 2.5mg  MWFSS, 5mg  TTh, last taken 9/7 PTA  Goal of Therapy:  INR 2-3    Plan:  Coumadin 2.5mg  today Daily PT/INR Monitor for bleeding complications  Eudelia Bunch, Pharm.D. [phone] 02/28/2014 12:54 PM

## 2014-03-01 LAB — PROTIME-INR
INR: 1.96 — ABNORMAL HIGH (ref 0.00–1.49)
Prothrombin Time: 22.3 seconds — ABNORMAL HIGH (ref 11.6–15.2)

## 2014-03-01 MED ORDER — WARFARIN SODIUM 5 MG PO TABS
5.0000 mg | ORAL_TABLET | Freq: Once | ORAL | Status: DC
  Filled 2014-03-01: qty 1

## 2014-03-01 MED ORDER — CYCLOBENZAPRINE HCL 10 MG PO TABS: 10.0000 mg | ORAL_TABLET | Freq: Two times a day (BID) | ORAL | Status: DC

## 2014-03-01 MED ORDER — LIDOCAINE 5 % EX PTCH: 1.0000 | MEDICATED_PATCH | CUTANEOUS | Status: DC

## 2014-03-01 MED ORDER — OXYCODONE HCL 5 MG PO TABS: 5.0000 mg | ORAL_TABLET | ORAL | Status: DC | PRN

## 2014-03-01 NOTE — Discharge Summary (Signed)
Physician Discharge Summary  [name] HGD:[id number] DOB: 1946/05/09 DOA: 02/18/2014  PCP: Tommy Medal, MD  Admit date: 02/18/2014 Discharge date: 03/01/2014  Time spent: > 35 minutes  Recommendations for Outpatient Follow-up:  1. Please f/u with INR levels and adjust coumadin appropriately 2. Thoracolumbosacral orthotic (per last two therapy notes, pt does not have to wear TLSO 3. Reassess TSH in 4-6 wks.   Discharge Diagnoses:  Principal Problem:   T12 compression fracture Active Problems:   HYPERTENSION, UNSPECIFIED   Atrial fibrillation   LEG EDEMA, BILATERAL   Morbid obesity   Chronic anticoagulation   Chronic steroid use   Unspecified constipation   Chronic diastolic heart failure   Dermatomyositis   Spinal cord compression   Discharge Condition: stable  Diet recommendation: heart healthy  Filed Weights   02/18/14 1509  Weight: 117.935 kg (260 lb)    History of present illness:  From original HPI: 68 y.o. female  Past medical history of atrial fibrillation on chronic Coumadin, chronic diastolic heart failure and chronic steroid use secondary to dermatomyositis who one week prior was getting out of her husband's elevated truck which was approximately 2 feet from the ground landing on both feet. Immediately upon landing, she felt something pop in her lower back she started having significant pain. X ray in ED showed T12 compression fracture.  Hospital Course:  T12 Compression fracture  -Likely secondary to mechanical etiology  -PT and OT evaluated while patient was in house. Pt does not need brace (Thoracolumbosacral orthotic) on discharge per PT/OT notes. - IR consulted and patient is status post T12 KP on 02/25/2014    Hypothyroidism  -TSH elevated at 8.840  -Continue Synthroid 125 mcg daily  -Will need to have TSH function repeated in 4-6 weeks   Atrial fibrillation  - Continue metoprolol for rate control  - Patient had to come off of Coumadin for KP  procedure.  - Coumadin to continue as per pharmacy recommendations.  Hypertension  -Stable, continue metoprolol   Constipation  -Continue bowel regimen   Chronic diastolic heart failure  - Stable and currently compensated   Thrombocytopenia  - Questionable etiology, no active bleeding  - Stable  Morbid obesity  - Agree that patient should discuss lifestyle modifications including diet and exercise with her primary care physician upon discharge   Bilateral lower extremity edema  - Likely secondary to chronic venous stasis complicated by obesity as well as steroids   Coagulopathy  - Coumadin per pharmacy   History of dermatomyositis and chronic steroid  -Continue home regimen of prednisone, methotrexate, Plaquenil    Procedures:  As listed above  Consultations:  IR  Discharge Exam: Filed Vitals:   03/01/14 0621  BP: 117/48  Pulse: 72  Temp: 97.8 F (36.6 C)  Resp: 18    General: Pt in nad, alert and awake Cardiovascular: pink extremities, no cyanosis Respiratory: cta bl, no wheezes  Discharge Instructions You were cared for by a hospitalist during your hospital stay. If you have any questions about your discharge medications or the care you received while you were in the hospital after you are discharged, you can call the unit and asked to speak with the hospitalist on call if the hospitalist that took care of you is not available. Once you are discharged, your primary care physician will handle any further medical issues. Please note that NO REFILLS for any discharge medications will be authorized once you are discharged, as it is imperative that you return to your  primary care physician (or establish a relationship with a primary care physician if you do not have one) for your aftercare needs so that they can reassess your need for medications and monitor your lab values.  Discharge Instructions   Call MD for:  persistant dizziness or light-headedness     Complete by:  As directed      Call MD for:  severe uncontrolled pain    Complete by:  As directed      Call MD for:  temperature >100.4    Complete by:  As directed      Diet - low sodium heart healthy    Complete by:  As directed      Discharge instructions    Complete by:  As directed   Home with home health and rolling walker. Pt to f/u with pcp in 1-2 weeks or sooner should any new concerns arise.     Increase activity slowly    Complete by:  As directed           Current Discharge Medication List    START taking these medications   Details  oxyCODONE (OXY IR/ROXICODONE) 5 MG immediate release tablet Take 1 tablet (5 mg total) by mouth every 4 (four) hours as needed for moderate pain. Qty: 30 tablet, Refills: 0      CONTINUE these medications which have NOT CHANGED   Details  acetaminophen (TYLENOL) 500 MG tablet Take 1,000 mg by mouth every 6 (six) hours as needed for mild pain.    Cholecalciferol (VITAMIN D3) 2000 UNITS TABS Take 2,000 Units by mouth daily.     cyclobenzaprine (FLEXERIL) 10 MG tablet Take 10 mg by mouth 2 (two) times daily.    furosemide (LASIX) 40 MG tablet Take 40 mg by mouth as needed for fluid.     hydrocortisone (ANUSOL-HC) 2.5 % rectal cream Place rectally 2 (two) times daily.    hydroxychloroquine (PLAQUENIL) 200 MG tablet Take 200 mg by mouth 2 (two) times daily.     levothyroxine (SYNTHROID, LEVOTHROID) 112 MCG tablet Take 112 mcg by mouth daily before breakfast.    methotrexate (RHEUMATREX) 2.5 MG tablet Take 12.5 mg by mouth once a week.     metoprolol succinate (TOPROL-XL) 25 MG 24 hr tablet Take 1 tablet by mouth  daily Qty: 30 tablet, Refills: 6    Multiple Vitamin (MULTIVITAMIN) tablet Take 1 tablet by mouth daily.      nitroGLYCERIN (NITROSTAT) 0.4 MG SL tablet Place 1 tablet (0.4 mg total) under the tongue every 5 (five) minutes as needed. Qty: 25 tablet, Refills: 12    pantoprazole (PROTONIX) 40 MG tablet Take 40 mg by mouth  daily.    potassium chloride SA (K-DUR,KLOR-CON) 20 MEQ tablet Take 1 tablet by mouth two  times daily Qty: 60 tablet, Refills: 6    predniSONE (DELTASONE) 1 MG tablet Take 5 mg by mouth daily.     warfarin (COUMADIN) 5 MG tablet Take 2.5-5 mg by mouth daily at 6 PM. 5mg  on Tuesday and Thursday . 2.5 mg Su,Mo,We,Fr,Sa      STOP taking these medications     traMADol (ULTRAM) 50 MG tablet        Allergies  Allergen Reactions  . Butoconazole     'Redness and swelling feverish ' 'worse symptoms '  . Lipitor [Atorvastatin Calcium] Other (See Comments)    'Muscle weakness'        The results of significant diagnostics from this hospitalization (including imaging, microbiology,  ancillary and laboratory) are listed below for reference.    Significant Diagnostic Studies: Dg Lumbar Spine Complete  02/18/2014   CLINICAL DATA:  Fall.  Low back pain.  EXAM: LUMBAR SPINE - COMPLETE 4+ VIEW  COMPARISON:  None.  FINDINGS: There is a mild levoconvex curve on the frontal view of the lumbar spine. Large stool burden is incidentally noted. Lumbar vertebral body height is preserved. Intervertebral disc spaces in the lumbar spine are within normal limits. Mild abdominal aortic atherosclerosis.  There is a T12 compression fracture with about 70% loss of anterior vertebral body height and retropulsion. Retropulsion appears to measure about 7 mm. No other compression fractures are identified.  IMPRESSION: 1. T12 compression fracture with 70% loss of anterior vertebral body height and retropulsion. Consider followup MRI for further assessment of the age and central canal compromise. 2. Normal appearance of the lumbar spine for age aside from a mild levoconvex curve.   Electronically Signed   By: Dereck Ligas M.D.   On: 02/18/2014 12:20   Ct Lumbar Spine Wo Contrast  02/20/2014   CLINICAL DATA:  Fall.  Evaluate T12 fracture  EXAM: CT LUMBAR SPINE WITHOUT CONTRAST  TECHNIQUE: Multidetector CT imaging of the  lumbar spine was performed without intravenous contrast administration. Multiplanar CT image reconstructions were also generated.  COMPARISON:  Lumbar radiographs 02/18/2014  FINDINGS: Burst fracture T12 involving all 3 columns. Retropulsion of bone into the canal by approximately 7 mm. Minimal kyphosis at the fracture level. This appears to be a benign fracture.  No other fracture is identified.  No focal bony lesion.  Image quality limited due to morbid obesity. Lower lumbar spine poorly visualized due to patient size.  IMPRESSION: Burst fracture T12. 7 mm retropulsion of bone into the canal causing 50% diameter stenosis of the canal. This appears to be a benign fracture.  Bony detail limited due to patient size.   Electronically Signed   By: Franchot Gallo M.D.   On: 02/20/2014 15:03   Dg Chest Port 1 View  02/24/2014   CLINICAL DATA:  Pre vertebroplasty.  EXAM: PORTABLE CHEST - 1 VIEW  COMPARISON:  03/07/2006  FINDINGS: Lungs are adequately inflated with minimal linear density in the left base likely linear atelectasis or scarring. Cardiomediastinal silhouette and remainder the exam is unremarkable.  IMPRESSION: No active disease.   Electronically Signed   By: Marin Olp M.D.   On: 02/24/2014 17:45   Ir Kypho Vertebral Thoracic Augmentation  02/25/2014   CLINICAL DATA:  Persistent pain from posttraumatic compression fracture deformity of T12.  EXAM: KYPHOPLASTY AT THORACIC T12:  TECHNIQUE: The procedure, risks (including but not limited to bleeding, infection, organ damage), benefits, and alternatives were explained to the patient and family. Questions regarding the procedure were encouraged and answered. The patient understands and consents to the procedure.  The patient was placed prone on the fluoroscopic table. The skin overlying the lower thoracic region was then prepped and draped in the usual sterile fashion. Maximal barrier sterile technique was utilized including caps, mask, sterile gowns,  sterile gloves, sterile drape, hand hygiene and skin antiseptic.  Intravenous Fentanyl and Versed were administered as conscious sedation during continuous cardiorespiratory monitoring by the radiology RN, with a total moderate sedation time of23 minutes.  As antibiotic prophylaxis, cefazolin 2 g was ordered pre-procedure and administered intravenously within !one hour! of incision.  The right pedicle at T12 was then infiltrated with 1% lidocaine followed by the advancement of a Kyphon trocar needle through  the right pedicle into the posterior one-third. The trocar was removed and the osteo drill was advanced to the anterior third of the vertebral body. The osteo drill was retracted. Through the working cannula, a Kyphon inflatable bone tamp 15 x 3 was advanced and positioned with the distal marker 5 mm from the anterior aspect of the cortex. Crossing of the midline was not seen on the AP projection. At this time, the balloon was expanded using contrast via a Kyphon inflation syringe device via micro tubing.  In similar fashion, the left pedicle was infiltrated with 1% lidocaine followed by the advancement of a second Kyphon trocar needle through the left pedicle into the posterior third of the vertebral body. The osteo drill was coaxially advanced to the anterior right third. The osteo drill was exchanged for a Kyphon inflatable bone tamp 15x 3, advanced to the 5 mm of the anterior aspect of the cortex. The balloon was then expanded using contrast as above.  Inflations were continued until there was near apposition across the midline and with the superior and the inferior end plates.  At this time, methylmethacrylate mixture was reconstituted in the Kyphon bone mixing device system. This was then loaded into the delivery mechanism, attached to Kyphon bone fillers.  The balloons were deflated and removed followed by the instillation of methylmethacrylate mixture with excellent filling in the AP and lateral  projections. No extravasation was noted in the disk spaces or posteriorly into the spinal canal. No epidural venous contamination was seen.  The patient tolerated the procedure well. There were no acute complications. The working cannulae and the bone filler were then retrieved and removed.  IMPRESSION: 1. Status post vertebral body augmentation using balloon kyphoplasty at thoracic T12 as described without event. 2. Per CMS PQRS reporting requirements (PQRS Measure 24): Given the patient's age of greater than 6 and the fracture site (hip, distal radius, or spine), the patient should be tested for osteoporosis using DXA, and the appropriate treatment considered based on the DXA results.   Electronically Signed   By: Arne Cleveland M.D.   On: 02/25/2014 10:58   Mr Attempted [name] Report  02/19/2014   This examination belongs to an outside facility and is stored  here for comparison purposes only.  Contact the originating outside  institution for any associated report or interpretation.   Microbiology: No results found for this or any previous visit (from the past 240 hour(s)).   Labs: Basic Metabolic Panel:  Recent Labs Lab 02/24/14 0450 02/25/14 1150  NA 141 141  K 4.3 3.9  CL 108 105  CO2 24 28  GLUCOSE 76 89  BUN 13 14  CREATININE 0.85 0.88  CALCIUM 8.2* 8.3*   Liver Function Tests: No results found for this basename: AST, ALT, ALKPHOS, BILITOT, PROT, ALBUMIN,  in the last 168 hours No results found for this basename: LIPASE, AMYLASE,  in the last 168 hours No results found for this basename: AMMONIA,  in the last 168 hours CBC:  Recent Labs Lab 02/23/14 0518 02/24/14 0450  WBC 6.9 6.2  HGB 11.0* 11.1*  HCT 33.3* 33.5*  MCV 96.8 99.4  PLT 112* 119*   Cardiac Enzymes: No results found for this basename: CKTOTAL, CKMB, CKMBINDEX, TROPONINI,  in the last 168 hours BNP: BNP (last 3 results)  Recent Labs  02/18/14 2014  PROBNP 143.6*   CBG: No results found for  this basename: GLUCAP,  in the last 168 hours     Signed:  Velvet Bathe  [facility] 03/01/2014, 12:16 PM

## 2014-03-01 NOTE — Progress Notes (Signed)
[facility] for coumadin Indication: atrial fibrillation  Allergies  Allergen Reactions  . Butoconazole     'Redness and swelling feverish ' 'worse symptoms '  . Lipitor [Atorvastatin Calcium] Other (See Comments)    'Muscle weakness'      Patient Measurements: Height: 5' (152.4 cm) Weight: 260 lb (117.935 kg) IBW/kg (Calculated) : 45.5 Dosing Weight: 75 kg  Vital Signs: Temp: 97.8 F (36.6 C) (09/19 0621) Temp src: Oral (09/19 0621) BP: 117/48 mmHg (09/19 0621) Pulse Rate: 72 (09/19 0621)  Labs:  Recent Labs  02/27/14 0405 02/28/14 0430 03/01/14 0446  LABPROT 19.1* 22.3* 22.3*  INR 1.60* 1.96* 1.96*    Estimated Creatinine Clearance: 73 ml/min (by C-G formula based on Cr of 0.88).   Assessment: 68 yo female on chronic warfarin therapy for afib. Admitted 9/8 with back pain after an injury 1 week ago.  She is s/p thoraic T12 kypoplasty. Coumadin resumed 9/15   INR today 1.96 Home warfarin dose 2.5mg  MWFSS, 5mg  TTh, last taken 9/7 PTA  Goal of Therapy:  INR 2-3    Plan:  Coumadin 5mg  today Daily PT/INR Monitor for bleeding complications  Javonta Gronau, Pharm.D. [phone] 03/01/2014 9:14 AM

## 2014-03-01 NOTE — Progress Notes (Signed)
Physical Therapy Treatment Patient Details Name: [name] MRN: [id number] DOB: Nov 03, 1945 Today's Date: 03-29-14    History of Present Illness 68 y.o. female admitted to [facility] on 02/18/14 with mid back pain and pop after getting out of an elevated truck.  X rays revealed a T12 compression fx with 80% loss of height.  Pt is now s/p T12 kyphoplasty on 02/25/14    PT Comments    Pt very discouraged and upset about waiting for equipment to discharge home.  Pt received walker but still waiting for wide BSC.  Pt decided to leave without BSC at this time.  Spent several minutes educating pt on overall HHPT and equipment needs.  Pt adamant about leaving and ready to go home.  Pt would not complete mobility with PT at this time however was asking overall questions on home environment and home management to complete task with overall safety with bed mobility and transfers from commode.   Follow Up Recommendations        Equipment Recommendations       Recommendations for Other Services       Precautions / Restrictions Precautions Precautions: Back;Fall Precaution Booklet Issued: Yes (comment) Precaution Comments: reviewed back precautions and handout.  Other Brace/Splint: Pt no longer needs TLSO however pt concerned of being charged with brace that she did not even use.  Restrictions Weight Bearing Restrictions: No    Mobility  Bed Mobility                  Transfers                    Ambulation/Gait                 Stairs            Wheelchair Mobility    Modified Rankin (Stroke Patients Only)       Balance                                    Cognition Arousal/Alertness: Awake/alert Behavior During Therapy: WFL for tasks assessed/performed Overall Cognitive Status: Within Functional Limits for tasks assessed                      Exercises      General Comments        Pertinent Vitals/Pain Pain Assessment:  0-10 Pain Score: 2     Home Living                      Prior Function            PT Goals (current goals can now be found in the care plan section) Acute Rehab PT Goals Patient Stated Goal: I'm ready to go now.  I'm tired of waiting.     Frequency       PT Plan      Co-evaluation             End of Session           Time: 1308-6578 PT Time Calculation (min): 30 min  Charges:  $Self Care/Home Management: 23-37                    G Codes:      [name] March 29, 2014, 2:42 PM  [name], [place] DPT [phone]

## 2014-03-11 ENCOUNTER — Ambulatory Visit
Admission: RE | Admit: 2014-03-11 | Discharge: 2014-03-11 | Disposition: A | Discharge status: Home / Self Care | Payer: Medicare Other | Source: Ambulatory Visit | Attending: Interventional Radiology | Admitting: Interventional Radiology

## 2014-03-11 ENCOUNTER — Other Ambulatory Visit (HOSPITAL_COMMUNITY): Payer: Self-pay | Admitting: Interventional Radiology

## 2014-03-11 DIAGNOSIS — M545 Low back pain, unspecified: Secondary | ICD-10-CM

## 2014-03-11 DIAGNOSIS — Z9889 Other specified postprocedural states: Secondary | ICD-10-CM

## 2014-03-13 ENCOUNTER — Other Ambulatory Visit: Payer: Self-pay | Admitting: Internal Medicine

## 2014-03-13 DIAGNOSIS — S22009A Unspecified fracture of unspecified thoracic vertebra, initial encounter for closed fracture: Secondary | ICD-10-CM

## 2014-03-25 ENCOUNTER — Ambulatory Visit
Admission: RE | Admit: 2014-03-25 | Discharge: 2014-03-25 | Disposition: A | Discharge status: Home / Self Care | Payer: Medicare Other | Source: Ambulatory Visit | Attending: Internal Medicine | Admitting: Internal Medicine

## 2014-03-25 DIAGNOSIS — S22009A Unspecified fracture of unspecified thoracic vertebra, initial encounter for closed fracture: Secondary | ICD-10-CM

## 2014-03-25 MED ORDER — GADOBENATE DIMEGLUMINE 529 MG/ML IV SOLN
20.0000 mL | Freq: Once | INTRAVENOUS | Status: AC | PRN
  Administered 2014-03-25: 20 mL via INTRAVENOUS

## 2014-03-31 ENCOUNTER — Telehealth: Payer: Self-pay | Admitting: Emergency Medicine

## 2014-03-31 NOTE — Telephone Encounter (Signed)
Message copied by Janith Lima on Mon Mar 31, 2014 12:04 PM ------      Message from: Dara Hoyer D III      Created: Fri Mar 28, 2014  9:44 AM      Regarding: RE: MRI RESULTS       New fx at T11.  Set up for outpt KP at this level.      Thanks      DDH                  ----- Message -----         From: Janith Lima, EMT         Sent: 03/27/2014   8:59 AM           To: Rickard Rhymes III, MD      Subject: FW: MRI RESULTS                                          PT HAD MR, PLEASE REVIEW AND ADVISE!            TAMS            ----- Message -----         From: Janith Lima, EMT         Sent: 03/17/2014  10:36 AM           To: Janith Lima, EMT, Henri Jamse Mead, RN      Subject: MRI RESULTS                                              PT HAVING MRI T SPINE AT 400PM- HAVE DR DDH TO REVIEW AND ADVISE ON ???                  TAMS             ------

## 2014-03-31 NOTE — Telephone Encounter (Signed)
CALLED PT AND MENTIONED SHE HAS A NEW FX AT T11 AND WILL FORWARD THIS INFO TO [place] AT [facility] TO GET HER SET UP FOR PROCEDURE.  SENT MSG TO JENNIFER VIA IB.

## 2014-04-15 ENCOUNTER — Other Ambulatory Visit (HOSPITAL_COMMUNITY): Payer: Self-pay | Admitting: Interventional Radiology

## 2014-04-15 ENCOUNTER — Telehealth (HOSPITAL_COMMUNITY): Payer: Self-pay | Admitting: Interventional Radiology

## 2014-04-15 DIAGNOSIS — M5489 Other dorsalgia: Secondary | ICD-10-CM

## 2014-04-15 DIAGNOSIS — IMO0002 Reserved for concepts with insufficient information to code with codable children: Secondary | ICD-10-CM

## 2014-04-15 NOTE — Telephone Encounter (Signed)
Called pt, discussed her T11 fx and treatment. Pt states that she is on Coumadin and for her last back procedure had to be admitted for monitor and control of her Coumadin. I told her that I would speak to the IR doc and get back in touch w/ her concerning treating this additional fracture. She states understanding and is in agreement with this plan of care. JM

## 2014-04-19 ENCOUNTER — Emergency Department (HOSPITAL_COMMUNITY)
Admission: EM | Admit: 2014-04-19 | Discharge: 2014-04-19 | Disposition: A | Discharge status: Home / Self Care | Payer: Medicare Other | Attending: Emergency Medicine | Admitting: Emergency Medicine

## 2014-04-19 ENCOUNTER — Encounter (HOSPITAL_COMMUNITY): Payer: Self-pay | Admitting: Cardiology

## 2014-04-19 DIAGNOSIS — M109 Gout, unspecified: Secondary | ICD-10-CM | POA: Diagnosis not present

## 2014-04-19 DIAGNOSIS — Z79899 Other long term (current) drug therapy: Secondary | ICD-10-CM | POA: Insufficient documentation

## 2014-04-19 DIAGNOSIS — I10 Essential (primary) hypertension: Secondary | ICD-10-CM | POA: Diagnosis not present

## 2014-04-19 DIAGNOSIS — Z7952 Long term (current) use of systemic steroids: Secondary | ICD-10-CM | POA: Insufficient documentation

## 2014-04-19 DIAGNOSIS — I4891 Unspecified atrial fibrillation: Secondary | ICD-10-CM | POA: Insufficient documentation

## 2014-04-19 DIAGNOSIS — R791 Abnormal coagulation profile: Secondary | ICD-10-CM | POA: Diagnosis not present

## 2014-04-19 DIAGNOSIS — D689 Coagulation defect, unspecified: Secondary | ICD-10-CM | POA: Diagnosis present

## 2014-04-19 DIAGNOSIS — Z7901 Long term (current) use of anticoagulants: Secondary | ICD-10-CM | POA: Insufficient documentation

## 2014-04-19 DIAGNOSIS — M1990 Unspecified osteoarthritis, unspecified site: Secondary | ICD-10-CM | POA: Diagnosis not present

## 2014-04-19 DIAGNOSIS — E785 Hyperlipidemia, unspecified: Secondary | ICD-10-CM | POA: Diagnosis not present

## 2014-04-19 DIAGNOSIS — K648 Other hemorrhoids: Secondary | ICD-10-CM | POA: Diagnosis not present

## 2014-04-19 DIAGNOSIS — I2510 Atherosclerotic heart disease of native coronary artery without angina pectoris: Secondary | ICD-10-CM | POA: Insufficient documentation

## 2014-04-19 DIAGNOSIS — K219 Gastro-esophageal reflux disease without esophagitis: Secondary | ICD-10-CM | POA: Insufficient documentation

## 2014-04-19 LAB — COMPREHENSIVE METABOLIC PANEL
ALT: 15 U/L (ref 0–35)
AST: 41 U/L — ABNORMAL HIGH (ref 0–37)
Albumin: 2.3 g/dL — ABNORMAL LOW (ref 3.5–5.2)
Alkaline Phosphatase: 142 U/L — ABNORMAL HIGH (ref 39–117)
Anion gap: 9 (ref 5–15)
BUN: 15 mg/dL (ref 6–23)
CO2: 26 mEq/L (ref 19–32)
Calcium: 8.4 mg/dL (ref 8.4–10.5)
Chloride: 107 mEq/L (ref 96–112)
Creatinine, Ser: 1.02 mg/dL (ref 0.50–1.10)
GFR calc Af Amer: 64 mL/min — ABNORMAL LOW (ref >90)
GFR calc non Af Amer: 56 mL/min — ABNORMAL LOW (ref >90)
Glucose, Bld: 108 mg/dL — ABNORMAL HIGH (ref 70–99)
Potassium: 4.7 mEq/L (ref 3.7–5.3)
Sodium: 142 mEq/L (ref 137–147)
Total Bilirubin: 0.6 mg/dL (ref 0.3–1.2)
Total Protein: 5.7 g/dL — ABNORMAL LOW (ref 6.0–8.3)

## 2014-04-19 LAB — CBC
HCT: 38.5 % (ref 36.0–46.0)
Hemoglobin: 12.1 g/dL (ref 12.0–15.0)
MCH: 31.2 pg (ref 26.0–34.0)
MCHC: 31.4 g/dL (ref 30.0–36.0)
MCV: 99.2 fL (ref 78.0–100.0)
Platelets: 125 10*3/uL — ABNORMAL LOW (ref 150–400)
RBC: 3.88 MIL/uL (ref 3.87–5.11)
RDW: 17.5 % — ABNORMAL HIGH (ref 11.5–15.5)
WBC: 6.4 10*3/uL (ref 4.0–10.5)

## 2014-04-19 LAB — PROTIME-INR
INR: 5.16 (ref 0.00–1.49)
Prothrombin Time: 47.9 seconds — ABNORMAL HIGH (ref 11.6–15.2)

## 2014-04-19 MED ORDER — PHYTONADIONE 5 MG PO TABS
2.5000 mg | ORAL_TABLET | Freq: Once | ORAL | Status: AC
  Administered 2014-04-19: 2.5 mg via ORAL
  Filled 2014-04-19: qty 1

## 2014-04-19 NOTE — ED Notes (Signed)
Husband at [facility]. Pt up to [facility],

## 2014-04-19 NOTE — Discharge Instructions (Signed)
Please follow up with Dr. Minna Antis in 2 days. DO NOT TAKE YOUR COUMADIN UNTIL SEEN BY DR. PANG  Warfarin Coagulopathy Warfarin (Coumadin) coagulopathy refers to bleeding that may occur as a complication of the medicine warfarin. Warfarin is an oral blood thinner (anticoagulant). Warfarin is used for medical conditions where thinning of the blood is needed to prevent blood clots.  CAUSES Bleeding is the most common and most serious complication of warfarin. The amount of bleeding is related to the warfarin dose and length of treatment. In addition, bleeding complications can also occur due to:  Intentional or accidental warfarin overdose.  Underlying medical conditions.  Dietary changes.  Medicine, herbal, supplement, or alcohol interactions. SYMPTOMS Severe bleeding while on warfarin may occur from any tissue or organ. Symptoms of the blood being too thin may include:  Bleeding from the nose or gums.  Blood in bowel movements which may appear as bright red, dark, or black tarry stools.  Blood in the urine which may appear as pink, red, or brown urine.  Unusual bruising or bruising easily.  A cut that does not stop bleeding within 10 minutes.  Vomiting blood or continuous nausea for more than 1 day.  Coughing up blood.  Broken blood vessels in your eye (subconjunctival hemorrhage).  Abdominal or back pain with or without flank bruising.  Sudden, severe headache.  Sudden weakness or numbness of the face, arm, or leg, especially on one side of the body.  Sudden confusion.  Trouble speaking (aphasia) or understanding.  Sudden trouble seeing in one or both eyes.  Sudden trouble walking.  Dizziness.  Loss of balance or coordination.  Vaginal bleeding.  Swelling or pain at an injection site.  Superficial fat tissue death (necrosis) which may cause skin scarring. This is more common in women and may first present as pain in the waist, thighs, or buttocks.  Fever. HOME  CARE INSTRUCTIONS  Always contact your health care provider of any concerns or signs of possible warfarin coagulopathy as soon as possible.  Take warfarin exactly as directed by your health care provider. It is recommended that you take your warfarin dose at the same time of the day. If you have been told to stop taking warfarin, do not resume taking warfarin until directed to do so by your health care provider. Follow your health care provider's instructions if you accidentally take an extra dose or miss a dose of warfarin. It is very important to take warfarin as directed since bleeding or blood clots could result in chronic or permanent injury, pain, or disability.  Keep all follow-up appointments with your health care provider as directed. It is very important to keep your appointments. Not keeping appointments could result in a chronic or permanent injury, pain, or disability because warfarin is a medicine that requires close monitoring.  While taking warfarin, you will need to have regular blood tests to measure your blood clotting time. These blood tests usually include both the prothrombin time (PT) and International Normalized Ratio (INR) tests. The PT and INR results allow your health care provider to adjust your dose of warfarin. The dose can change for many reasons. It is critically important that you have your PT and INR levels drawn exactly as directed. Your warfarin dose may stay the same or change depending on what the PT and INR results are. Be sure to follow up with your health care provider regarding your PT and INR test results and what your warfarin dosage should be.  Many  medicines can interfere with warfarin and affect the PT and INR results. You must tell your health care provider about any and all medicines you take. This includes all vitamins and supplements. Ask your health care provider before taking these. Prescription and over-the-counter medicine consistency is critical to  warfarin management. It is important that potential interactions are checked before you start a new medicine. Be especially cautious with aspirin and anti-inflammatory medicines. Ask your health care provider before taking these. Medicines such as antibiotics and acid-reducing medicine can interact with warfarin and can cause an increased warfarin effect. Warfarin can also interfere with the effectiveness of medicines you are taking. Do not take or discontinue any prescribed or over-the-counter medicine except on the advice of your health care provider or pharmacist.  Some vitamins, supplements, and herbal products interfere with the effectiveness of warfarin. Vitamin E may increase the anticoagulant effects of warfarin. Vitamin K can cause warfarin to be less effective. Do not take or discontinue any vitamin, supplement, or herbal product except on the advice of your health care provider or pharmacist.  Eat what you normally eat and keep the vitamin K content of your diet consistent. Avoid major changes in your diet, or notify your health care provider before changing your diet. Suddenly getting a lot more vitamin K could cause your blood to clot too quickly. A sudden decrease in vitamin K intake could cause your blood to clot too slowly. These changes in vitamin K intake could lead to dangerous blood clotsor to bleeding. To keep your vitamin K intake consistent, you must be aware of which foods contain moderate or high amounts of vitamin K. Some foods high in vitamin K include spinach, kale, broccoli, cabbage, greens, Brussels sprouts, asparagus, bok choy, coleslaw, parsley, and green tea. Arrange a visit with a dietitian to answer your questions.  If you have a loss of appetite or get the stomach flu (viral gastroenteritis), talk to your health care provider as soon as possible. A decrease in your normal vitamin K intake can make you more sensitive to your usual dose of warfarin.  Some medical conditions  may increase your risk for bleeding while you are taking warfarin. A fever, diarrhea lasting more than a day, worsening heart failure, or worsening liver function are some medical conditions that could affect warfarin. Contact your health care provider if you have any of these medical conditions.  Be careful not to cut yourself when using sharp objects or while shaving.  Alcohol can change the body's ability to handle warfarin. It is best to avoid alcoholic drinks or consume only very small amounts while taking warfarin. Notify your health care provider if you change your alcohol intake. A sudden increase in alcohol use can increase your risk of bleeding. Chronic alcohol use can cause warfarin to be less effective.  Limit physical activities or sports that could result in a fall or cause injury.  Do not use warfarin if you are pregnant.  Inform all your health care providers and your dentist that you take warfarin.  Inform all health care providers if you are taking warfarin and aspirin or platelet inhibitor medicines such as clopidogrel, ticagrelor, or prasugrel. Use of these medicines in addition to warfarin can increase your risk of bleeding or death. Taking these medicines together should only be done under the direct care of your health care providers. SEEK IMMEDIATE MEDICAL CARE IF:  You cough up blood.  You have dark or black stools or there is bright red  blood coming from your rectum.  You vomit blood or have nausea for more than 1 day.  You have blood in the urine or pink-colored urine.  You have unusual bruising or have increased bruising.  You have bleeding from the nose or gums that does not stop quickly.  You have a cut that does not stop bleeding within 2-3 minutes.  You have sudden weakness or numbness of the face, arm, or leg, especially on one side of the body.  You have sudden confusion.  You have trouble speaking (aphasia) or understanding.  You have sudden  trouble seeing in one or both eyes.  You have sudden trouble walking.  You have dizziness.  You have a loss of balance or coordination.  You have a sudden, severe headache.  You have a serious fall or head injury, even if you are not bleeding.  You have swelling or pain at an injection site.  You have unexplained tenderness or pain in the abdomen, back, waist, thighs, or buttocks.  You have a fever. Any of these symptoms may represent a serious problem that is an emergency. Do not wait to see if the symptoms will go away. Get medical help right away. Call your local emergency services (911 in [place] Do not drive yourself to the hospital. Document Released: 05/08/2006 Document Revised: 10/14/2013 Document Reviewed: 11/08/2011 [facility] Patient Information 2015 [place], [place]. This information is not intended to replace advice given to you by your health care provider. Make sure you discuss any questions you have with your health care provider.

## 2014-04-19 NOTE — ED Notes (Addendum)
Pt reports she scratched a scab on her right forearm and started bleeding. Reports this was 0330 this morning and has been bleeding all day. Denies any pain. Bleeding controlled

## 2014-04-19 NOTE — ED Notes (Signed)
States, "ready to go, I was ready an hour ago", VSS, dressing CDI, no signs of bleeding. CMS intact. Cap refill <2sec, radial /ulnar pulses present. Denies numbness or tingling.

## 2014-04-19 NOTE — ED Notes (Signed)
Right arm bandage clean dry and intact.

## 2014-04-19 NOTE — ED Notes (Signed)
Critical Lab reported to EDP  

## 2014-04-19 NOTE — ED Notes (Signed)
MD at bedside. 

## 2014-04-19 NOTE — ED Provider Notes (Signed)
CSN: [id number]     Arrival date & time 04/19/14  1549 History   First MD Initiated Contact with Patient 04/19/14 1650     Chief Complaint  Patient presents with  . Coagulation Disorder     (Consider location/radiation/quality/duration/timing/severity/associated sxs/prior Treatment) Patient is a 68 y.o. female presenting with general illness. The history is provided by the patient.  Illness Location:  R forearm Quality:  Bleeding Severity:  Moderate Onset quality:  Gradual Timing:  Constant Progression:  Unchanged Chronicity:  New Context:  Picked off a scab, now bleeding uncontrolled despite direct presure Relieved by:  Nothing Worsened by:  Nothing Associated symptoms: no cough, no diarrhea, no fever, no rash and no shortness of breath     Past Medical History  Diagnosis Date  . Atrial fibrillation 09/20/2008  . HYPERTENSION, UNSPECIFIED 09/20/2008  . LEG EDEMA, BILATERAL 09/20/2008  . Internal hemorrhoids   . GERD (gastroesophageal reflux disease)   . Hiatal hernia   . Dermatomyositis   . Morbid obesity   . Osteoarthritis   . Hypothyroidism   . Sleep apnea   . History of pancreatitis   . Chronic diastolic heart failure   . Shingles   . Gall stones   . Gout   . Deviated septum   . Hyperlipidemia   . Obesity   . Continuous urine leakage   . CAD (coronary artery disease)     a. LHC 03/2006: EF 65%, mid LAD 40-50%, ostial D1 70-80%, normal circumflex, normal RCA;  b. Lexiscan Myoview 09/2010: No ischemia or scar, EF 75%;  c. Lex MV 5/14:  Normal, no ischemia, EF 71%  . Hx of echocardiogram     Echo 8/14:  Mild LVH, EF 55-65%, normal wall motion, Tr AI, mild MR, mod TR, PASP 45  . History of PFTs     PFTs 10/2012: normal.    Past Surgical History  Procedure Laterality Date  . Total abdominal hysterectomy    . Nasal septum surgery     Family History  Problem Relation Age of Onset  . Heart attack Brother     x 2  . Coronary artery disease Father   . Colon cancer  Paternal Uncle   . Kidney disease Brother    History  Substance Use Topics  . Smoking status: Never Smoker   . Smokeless tobacco: Never Used  . Alcohol Use: No   OB History    No data available     Review of Systems  Constitutional: Negative for fever.  Respiratory: Negative for cough and shortness of breath.   Gastrointestinal: Negative for diarrhea.  Skin: Negative for rash.  All other systems reviewed and are negative.     Allergies  Butoconazole and Lipitor  Home Medications   Prior to Admission medications   Medication Sig Start Date End Date Taking? Authorizing Provider  acetaminophen (TYLENOL) 500 MG tablet Take 1,000 mg by mouth every 6 (six) hours as needed for mild pain.    Historical Provider, MD  Cholecalciferol (VITAMIN D3) 2000 UNITS TABS Take 2,000 Units by mouth daily.     Historical Provider, MD  cyclobenzaprine (FLEXERIL) 10 MG tablet Take 1 tablet (10 mg total) by mouth 2 (two) times daily. 03/01/14   Velvet Bathe, MD  furosemide (LASIX) 40 MG tablet Take 40 mg by mouth as needed for fluid.  08/26/13   Liliane Shi, PA-C  hydrocortisone (ANUSOL-HC) 2.5 % rectal cream Place rectally 2 (two) times daily.    Historical Provider,  MD  hydroxychloroquine (PLAQUENIL) 200 MG tablet Take 200 mg by mouth 2 (two) times daily.  08/06/10   Historical Provider, MD  levothyroxine (SYNTHROID, LEVOTHROID) 112 MCG tablet Take 112 mcg by mouth daily before breakfast.    Historical Provider, MD  lidocaine (LIDODERM) 5 % Place 1 patch onto the skin daily. Remove & Discard patch within 12 hours or as directed by MD 03/01/14   Velvet Bathe, MD  methotrexate (RHEUMATREX) 2.5 MG tablet Take 12.5 mg by mouth once a week.  08/06/10   Historical Provider, MD  metoprolol succinate (TOPROL-XL) 25 MG 24 hr tablet Take 1 tablet by mouth  daily    [name], MD  Multiple Vitamin (MULTIVITAMIN) tablet Take 1 tablet by mouth daily.      Historical Provider, MD  nitroGLYCERIN (NITROSTAT) 0.4  MG SL tablet Place 1 tablet (0.4 mg total) under the tongue every 5 (five) minutes as needed. 08/26/13   Liliane Shi, PA-C  oxyCODONE (OXY IR/ROXICODONE) 5 MG immediate release tablet Take 1 tablet (5 mg total) by mouth every 4 (four) hours as needed for moderate pain. 03/01/14   Velvet Bathe, MD  pantoprazole (PROTONIX) 40 MG tablet Take 40 mg by mouth daily. 08/09/10   Historical Provider, MD  potassium chloride SA (K-DUR,KLOR-CON) 20 MEQ tablet Take 1 tablet by mouth two  times daily    [name], MD  predniSONE (DELTASONE) 1 MG tablet Take 5 mg by mouth daily.  08/17/10   Historical Provider, MD  warfarin (COUMADIN) 5 MG tablet Take 2.5-5 mg by mouth daily at 6 PM. 5mg  on Tuesday and Thursday . 2.5 mg Su,Mo,We,Fr,Sa 07/11/11   Renella Cunas, MD   BP 160/65 mmHg  Pulse 80  Temp(Src) 98 F (36.7 C)  Resp 13  SpO2 99% Physical Exam  Constitutional: She is oriented to person, place, and time. She appears well-developed and well-nourished. No distress.  HENT:  Head: Normocephalic and atraumatic.  Mouth/Throat: Oropharynx is clear and moist.  Eyes: EOM are normal. Pupils are equal, round, and reactive to light.  Neck: Normal range of motion. Neck supple.  Cardiovascular: Normal rate and regular rhythm.  Exam reveals no friction rub.   No murmur heard. Pulmonary/Chest: Effort normal and breath sounds normal. No respiratory distress. She has no wheezes. She has no rales.  Abdominal: Soft. She exhibits no distension. There is no tenderness. There is no rebound.  Musculoskeletal: Normal range of motion. She exhibits no edema.       Arms: Neurological: She is alert and oriented to person, place, and time.  Skin: She is not diaphoretic.  Nursing note and vitals reviewed.   ED Course  Procedures (including critical care time) Labs Review Labs Reviewed  CBC  PROTIME-INR  COMPREHENSIVE METABOLIC PANEL    Imaging Review No results found.   EKG Interpretation   Date/Time:  Saturday  April 19 2014 16:58:12 EST Ventricular Rate:  77 PR Interval:    QRS Duration: 89 QT Interval:  430 QTC Calculation: 487 R Axis:   45 Text Interpretation:  Atrial fibrillation Low voltage, precordial leads  Borderline prolonged QT interval Similar to prior Confirmed by Mingo Amber  MD,  Broussard ([id number]) on 04/19/2014 5:03:14 PM      MDM   Final diagnoses:  Supratherapeutic INR    68 year old female on Coumadin presents with bleeding arm wound. She picked a scab off and has been unable to control the bleeding on her right upper forearm wound. On exam, small wound  about 0.5 cm in diameter. Dressing removed - very mild bleeding. Unable to control bleeding with topical hemostatic powders, will put wound dressing on with hemostatic gauze. Denies any other symptoms, no syncope, CP, SOB, N/V. Will check basic labs and INR since she was told she had hematuria at her PCP a few days ago. Denies any recent meds that would affect her INR. INR 5.16. No life-threatening bleeding. Given PO Vitamin K, wound on arm hemostatic with pressure dressing. NVI distally to pressure dressing. Patient will f/u with Dr. Minna Antis for INR check in 2 days, instructed not to take Coumadin until seen by her PCP.  Evelina Bucy, MD 04/19/14 [phone]

## 2014-05-01 ENCOUNTER — Other Ambulatory Visit (HOSPITAL_COMMUNITY): Payer: Self-pay | Admitting: Interventional Radiology

## 2014-05-01 DIAGNOSIS — M5489 Other dorsalgia: Secondary | ICD-10-CM

## 2014-05-01 DIAGNOSIS — IMO0002 Reserved for concepts with insufficient information to code with codable children: Secondary | ICD-10-CM

## 2014-05-04 ENCOUNTER — Other Ambulatory Visit: Payer: Self-pay | Admitting: Radiology

## 2014-05-06 ENCOUNTER — Ambulatory Visit (HOSPITAL_COMMUNITY): Admission: RE | Admit: 2014-05-06 | Payer: Medicare Other | Source: Ambulatory Visit

## 2014-05-27 ENCOUNTER — Other Ambulatory Visit: Payer: Self-pay | Admitting: Radiology

## 2014-05-28 ENCOUNTER — Ambulatory Visit (HOSPITAL_COMMUNITY)
Admission: RE | Admit: 2014-05-28 | Discharge: 2014-05-28 | Disposition: A | Discharge status: Home / Self Care | Payer: Medicare Other | Source: Ambulatory Visit | Attending: Interventional Radiology | Admitting: Interventional Radiology

## 2014-05-28 ENCOUNTER — Encounter (HOSPITAL_COMMUNITY): Payer: Self-pay

## 2014-05-28 ENCOUNTER — Other Ambulatory Visit (HOSPITAL_COMMUNITY): Payer: Self-pay | Admitting: Interventional Radiology

## 2014-05-28 DIAGNOSIS — K219 Gastro-esophageal reflux disease without esophagitis: Secondary | ICD-10-CM | POA: Diagnosis not present

## 2014-05-28 DIAGNOSIS — S22080G Wedge compression fracture of T11-T12 vertebra, subsequent encounter for fracture with delayed healing: Secondary | ICD-10-CM | POA: Insufficient documentation

## 2014-05-28 DIAGNOSIS — M810 Age-related osteoporosis without current pathological fracture: Secondary | ICD-10-CM | POA: Insufficient documentation

## 2014-05-28 DIAGNOSIS — M5489 Other dorsalgia: Secondary | ICD-10-CM

## 2014-05-28 DIAGNOSIS — Z7982 Long term (current) use of aspirin: Secondary | ICD-10-CM | POA: Diagnosis not present

## 2014-05-28 DIAGNOSIS — M109 Gout, unspecified: Secondary | ICD-10-CM | POA: Insufficient documentation

## 2014-05-28 DIAGNOSIS — X58XXXD Exposure to other specified factors, subsequent encounter: Secondary | ICD-10-CM | POA: Insufficient documentation

## 2014-05-28 DIAGNOSIS — G4730 Sleep apnea, unspecified: Secondary | ICD-10-CM | POA: Insufficient documentation

## 2014-05-28 DIAGNOSIS — I10 Essential (primary) hypertension: Secondary | ICD-10-CM | POA: Diagnosis not present

## 2014-05-28 DIAGNOSIS — E785 Hyperlipidemia, unspecified: Secondary | ICD-10-CM | POA: Insufficient documentation

## 2014-05-28 DIAGNOSIS — IMO0002 Reserved for concepts with insufficient information to code with codable children: Secondary | ICD-10-CM

## 2014-05-28 DIAGNOSIS — I2510 Atherosclerotic heart disease of native coronary artery without angina pectoris: Secondary | ICD-10-CM | POA: Insufficient documentation

## 2014-05-28 DIAGNOSIS — E039 Hypothyroidism, unspecified: Secondary | ICD-10-CM | POA: Insufficient documentation

## 2014-05-28 DIAGNOSIS — I4891 Unspecified atrial fibrillation: Secondary | ICD-10-CM | POA: Insufficient documentation

## 2014-05-28 LAB — CBC WITH DIFFERENTIAL/PLATELET
Basophils Absolute: 0 10*3/uL (ref 0.0–0.1)
Basophils Relative: 0 % (ref 0–1)
Eosinophils Absolute: 0 10*3/uL (ref 0.0–0.7)
Eosinophils Relative: 0 % (ref 0–5)
HCT: 40.1 % (ref 36.0–46.0)
Hemoglobin: 13 g/dL (ref 12.0–15.0)
Lymphocytes Relative: 40 % (ref 12–46)
Lymphs Abs: 4.3 10*3/uL — ABNORMAL HIGH (ref 0.7–4.0)
MCH: 32 pg (ref 26.0–34.0)
MCHC: 32.4 g/dL (ref 30.0–36.0)
MCV: 98.8 fL (ref 78.0–100.0)
Monocytes Absolute: 0.9 10*3/uL (ref 0.1–1.0)
Monocytes Relative: 8 % (ref 3–12)
Neutro Abs: 5.5 10*3/uL (ref 1.7–7.7)
Neutrophils Relative %: 52 % (ref 43–77)
Platelets: 136 10*3/uL — ABNORMAL LOW (ref 150–400)
RBC: 4.06 MIL/uL (ref 3.87–5.11)
RDW: 14.9 % (ref 11.5–15.5)
WBC: 10.7 10*3/uL — ABNORMAL HIGH (ref 4.0–10.5)

## 2014-05-28 LAB — PROTIME-INR
INR: 1.22 (ref 0.00–1.49)
Prothrombin Time: 15.5 seconds — ABNORMAL HIGH (ref 11.6–15.2)

## 2014-05-28 MED ORDER — HYDROCODONE-ACETAMINOPHEN 5-325 MG PO TABS
ORAL_TABLET | ORAL | Status: AC
  Administered 2014-05-28: 1 via ORAL
  Filled 2014-05-28: qty 1

## 2014-05-28 MED ORDER — SODIUM CHLORIDE 0.9 % IV SOLN
INTRAVENOUS | Status: DC
  Administered 2014-05-28: 09:00:00 via INTRAVENOUS

## 2014-05-28 MED ORDER — BUPIVACAINE HCL (PF) 0.25 % IJ SOLN
INTRAMUSCULAR | Status: AC
  Filled 2014-05-28: qty 30

## 2014-05-28 MED ORDER — FENTANYL CITRATE 0.05 MG/ML IJ SOLN
INTRAMUSCULAR | Status: AC | PRN
  Administered 2014-05-28: 25 ug via INTRAVENOUS
  Administered 2014-05-28: 50 ug via INTRAVENOUS
  Administered 2014-05-28 (×2): 25 ug via INTRAVENOUS

## 2014-05-28 MED ORDER — CEFAZOLIN SODIUM-DEXTROSE 2-3 GM-% IV SOLR
2.0000 g | Freq: Once | INTRAVENOUS | Status: AC
  Administered 2014-05-28: 2 g via INTRAVENOUS

## 2014-05-28 MED ORDER — IOHEXOL 300 MG/ML  SOLN
50.0000 mL | Freq: Once | INTRAMUSCULAR | Status: AC | PRN
  Administered 2014-05-28: 1 mL

## 2014-05-28 MED ORDER — MIDAZOLAM HCL 2 MG/2ML IJ SOLN
INTRAMUSCULAR | Status: AC
  Filled 2014-05-28: qty 4

## 2014-05-28 MED ORDER — FENTANYL CITRATE 0.05 MG/ML IJ SOLN
INTRAMUSCULAR | Status: AC
  Filled 2014-05-28: qty 4

## 2014-05-28 MED ORDER — MIDAZOLAM HCL 2 MG/2ML IJ SOLN
INTRAMUSCULAR | Status: AC | PRN
  Administered 2014-05-28 (×2): 1 mg via INTRAVENOUS
  Administered 2014-05-28 (×2): 0.5 mg via INTRAVENOUS

## 2014-05-28 MED ORDER — CEFAZOLIN SODIUM-DEXTROSE 2-3 GM-% IV SOLR
INTRAVENOUS | Status: AC
  Administered 2014-05-28: 2 g via INTRAVENOUS
  Filled 2014-05-28: qty 50

## 2014-05-28 MED ORDER — HYDROCODONE-ACETAMINOPHEN 5-325 MG PO TABS
1.0000 | ORAL_TABLET | Freq: Once | ORAL | Status: AC
  Administered 2014-05-28: 1 via ORAL

## 2014-05-28 NOTE — Progress Notes (Signed)
Pt's family stated that they did not have opportunity to speak with the doctor after pt's procedure. Dr. Vernard Gambles paged to inform him that pt's family requesting to speak with him.  Dr. Vernard Gambles states that he can come speak with them when he has completed what he is working on. Pt's family informed.

## 2014-05-28 NOTE — Sedation Documentation (Signed)
Patient is resting comfortably. 

## 2014-05-28 NOTE — H&P (Signed)
Chief Complaint: Back pain- worsening  Referring Physician(s): Hassell,Dayne D  History of Present Illness: [name] is a 68 y.o. female  Previous Kyphoplasty Thoracic 12 02/25/2014 Pain continued No new injury MRI 03/28/14 revealed Thoracic 11 endplate fx Now scheduled for T 11 Vertebroplasty/kyphoplasty Hx afib Off coumadin now over 1 mo per MD Complains of dry cough x 4 days; afeb   Past Medical History  Diagnosis Date  . Atrial fibrillation 09/20/2008  . HYPERTENSION, UNSPECIFIED 09/20/2008  . LEG EDEMA, BILATERAL 09/20/2008  . Internal hemorrhoids   . GERD (gastroesophageal reflux disease)   . Hiatal hernia   . Dermatomyositis   . Morbid obesity   . Osteoarthritis   . Hypothyroidism   . Sleep apnea   . History of pancreatitis   . Chronic diastolic heart failure   . Shingles   . Gall stones   . Gout   . Deviated septum   . Hyperlipidemia   . Obesity   . Continuous urine leakage   . CAD (coronary artery disease)     a. LHC 03/2006: EF 65%, mid LAD 40-50%, ostial D1 70-80%, normal circumflex, normal RCA;  b. Lexiscan Myoview 09/2010: No ischemia or scar, EF 75%;  c. Lex MV 5/14:  Normal, no ischemia, EF 71%  . Hx of echocardiogram     Echo 8/14:  Mild LVH, EF 55-65%, normal wall motion, Tr AI, mild MR, mod TR, PASP 45  . History of PFTs     PFTs 10/2012: normal.     Past Surgical History  Procedure Laterality Date  . Total abdominal hysterectomy    . Nasal septum surgery      Allergies: Butoconazole and Lipitor  Medications: Prior to Admission medications   Medication Sig Start Date End Date Taking? Authorizing Provider  acetaminophen (TYLENOL) 500 MG tablet Take 1,000 mg by mouth every 6 (six) hours as needed for mild pain.   Yes Historical Provider, MD  aspirin EC 325 MG tablet Take 325 mg by mouth daily.   Yes Historical Provider, MD  benzonatate (TESSALON) 200 MG capsule Take 200 mg by mouth 3 (three) times daily as needed for cough.   Yes  Historical Provider, MD  hydroxychloroquine (PLAQUENIL) 200 MG tablet Take 200 mg by mouth 2 (two) times daily.  08/06/10  Yes Historical Provider, MD  levothyroxine (SYNTHROID, LEVOTHROID) 112 MCG tablet Take 112 mcg by mouth daily before breakfast.   Yes Historical Provider, MD  metoprolol succinate (TOPROL-XL) 25 MG 24 hr tablet Take 1 tablet by mouth  daily   Yes Coralyn Mark, MD  Multiple Vitamin (MULTIVITAMIN) tablet Take 1 tablet by mouth daily.     Yes Historical Provider, MD  nystatin cream (MYCOSTATIN) Apply 1 application topically 2 (two) times daily.   Yes Historical Provider, MD  OVER THE COUNTER MEDICATION Place 1 drop into both eyes daily as needed (for dry eyes. Advanced eye relief).   Yes Historical Provider, MD  pantoprazole (PROTONIX) 40 MG tablet Take 40 mg by mouth daily. 08/09/10  Yes Historical Provider, MD  potassium chloride SA (K-DUR,KLOR-CON) 20 MEQ tablet Take 1 tablet by mouth two  times daily   Yes Coralyn Mark, MD  predniSONE (DELTASONE) 5 MG tablet Take 5 mg by mouth daily with breakfast.   Yes Historical Provider, MD  promethazine-codeine (PHENERGAN WITH CODEINE) 6.25-10 MG/5ML syrup Take 5 mLs by mouth 2 (two) times daily as needed for cough.   Yes Historical Provider, MD  Cholecalciferol (VITAMIN  D3) 2000 UNITS TABS Take 2,000 Units by mouth daily.     Historical Provider, MD  cyclobenzaprine (FLEXERIL) 10 MG tablet Take 1 tablet (10 mg total) by mouth 2 (two) times daily. 03/01/14   Velvet Bathe, MD  furosemide (LASIX) 40 MG tablet Take 40 mg by mouth daily as needed for fluid.  08/26/13   Liliane Shi, PA-C  lidocaine (LIDODERM) 5 % Place 1 patch onto the skin daily. Remove & Discard patch within 12 hours or as directed by MD 03/01/14   Velvet Bathe, MD  methotrexate (RHEUMATREX) 2.5 MG tablet Take 12.5 mg by mouth once a week.  08/06/10   Historical Provider, MD  metoprolol succinate (TOPROL-XL) 25 MG 24 hr tablet Take 25 mg by mouth daily.    Historical  Provider, MD  nitroGLYCERIN (NITROSTAT) 0.4 MG SL tablet Place 1 tablet (0.4 mg total) under the tongue every 5 (five) minutes as needed. Patient taking differently: Place 0.4 mg under the tongue every 5 (five) minutes as needed for chest pain.  08/26/13   Liliane Shi, PA-C  oxyCODONE (OXY IR/ROXICODONE) 5 MG immediate release tablet Take 1 tablet (5 mg total) by mouth every 4 (four) hours as needed for moderate pain. 03/01/14   Velvet Bathe, MD  potassium chloride SA (K-DUR,KLOR-CON) 20 MEQ tablet Take 20 mEq by mouth 2 (two) times daily.    Historical Provider, MD  predniSONE (DELTASONE) 1 MG tablet Take 5 mg by mouth daily.  08/17/10   Historical Provider, MD  warfarin (COUMADIN) 5 MG tablet Take 2.5-5 mg by mouth daily at 6 PM. 5mg  on Tuesday and Thursday . 2.5 mg Su,Mo,We,Fr,Sa 07/11/11   Renella Cunas, MD    Family History  Problem Relation Age of Onset  . Heart attack Brother     x 2  . Coronary artery disease Father   . Colon cancer Paternal Uncle   . Kidney disease Brother     History   Social History  . Marital Status: Married    Spouse Name: N/A    Number of Children: 2  . Years of Education: N/A   Occupational History  .     Social History Main Topics  . Smoking status: Never Smoker   . Smokeless tobacco: Never Used  . Alcohol Use: No  . Drug Use: No  . Sexual Activity: None   Other Topics Concern  . None   Social History Narrative     Review of Systems: A 12 point ROS discussed and pertinent positives are indicated in the HPI above.  All other systems are negative.  Review of Systems  Constitutional: Positive for activity change and fatigue. Negative for appetite change.  Respiratory: Positive for cough. Negative for chest tightness and shortness of breath.   Cardiovascular: Negative for chest pain.  Gastrointestinal: Negative for abdominal pain and abdominal distention.  Genitourinary: Negative for difficulty urinating.  Musculoskeletal: Positive for  back pain and gait problem.  Neurological: Positive for weakness. Negative for dizziness, tremors, seizures, syncope, facial asymmetry, speech difficulty, light-headedness, numbness and headaches.  Psychiatric/Behavioral: Negative for behavioral problems and confusion.    Vital Signs: BP 150/72 mmHg  Pulse 101  Temp(Src) 97.4 F (36.3 C) (Oral)  Resp 18  Ht 5' (1.524 m)  Wt 117.935 kg (260 lb)  BMI 50.78 kg/m2  SpO2 99%  Physical Exam  Cardiovascular: Normal rate and regular rhythm.   No murmur heard. Pulmonary/Chest: Effort normal and breath sounds normal. She has no wheezes.  Abdominal: Soft. Bowel sounds are normal. There is no tenderness.  Musculoskeletal: Normal range of motion.  Back pain  Skin: Skin is warm.  Pannus rash  Psychiatric: She has a normal mood and affect. Her behavior is normal. Judgment and thought content normal.    Imaging: No results found.  Labs:  CBC:  Recent Labs  02/23/14 0518 02/24/14 0450 04/19/14 1733 05/28/14 0735  WBC 6.9 6.2 6.4 10.7*  HGB 11.0* 11.1* 12.1 13.0  HCT 33.3* 33.5* 38.5 40.1  PLT 112* 119* 125* 136*    COAGS:  Recent Labs  02/28/14 0430 03/01/14 0446 04/19/14 1733 05/28/14 0735  INR 1.96* 1.96* 5.16* 1.22    BMP:  Recent Labs  02/21/14 0432 02/24/14 0450 02/25/14 1150 04/19/14 1733  NA 139 141 141 142  K 4.2 4.3 3.9 4.7  CL 105 108 105 107  CO2 26 24 28 26   GLUCOSE 83 76 89 108*  BUN 17 13 14 15   CALCIUM 8.1* 8.2* 8.3* 8.4  CREATININE 0.90 0.85 0.88 1.02  GFRNONAA 65* 69* 66* 56*  GFRAA 75* 80* 77* 64*    LIVER FUNCTION TESTS:  Recent Labs  02/18/14 1331 04/19/14 1733  BILITOT 0.5 0.6  AST 42* 41*  ALT 26 15  ALKPHOS 105 142*  PROT 5.6* 5.7*  ALBUMIN 2.4* 2.3*    TUMOR MARKERS: No results for input(s): AFPTM, CEA, CA199, CHROMGRNA in the last 8760 hours.  Assessment and Plan:  Back pain - worsening Previous T12 KP 02/25/14 MRI 03/2014 reveals T11 endplate fx Now scheduled  for T11 VP/KP Pt aware of procedure benefits and risks and agreeable to proceed Consent signed andin chart  Thank you for this interesting consult.  I greatly enjoyed meeting [name] and look forward to participating in their care.     I spent a total of 20 minutes face to face in clinical consultation, greater than 50% of which was counseling/coordinating care for T11 VP/KP  Signed: Ozell Ferrera A 05/28/2014, 8:34 AM

## 2014-05-28 NOTE — Progress Notes (Signed)
Pt c/o back pain. Jannifer Franklin PA paged to request pain medication order. Order received and given as ordered.

## 2014-05-28 NOTE — Discharge Instructions (Signed)
Percutaneous Vertebroplasty, Care After °These instructions give you information on caring for yourself after your procedure. Your doctor may also give you more specific instructions. Call your doctor if you have any problems or questions after your procedure. °HOME CARE °· Take medicine as told by your doctor. °· Keep your wound dry and covered for 24 hours or as told by your doctor. °· Ask your doctor when you can bathe or shower. °· Put an ice pack on your wound. °¨ Put ice in a plastic bag. °¨ Place a towel between your skin and the bag. °¨ Leave the ice on for 15-20 minutes, 3-4 times a day. °· Rest in your bed for 24 hours or as told by your doctor. °· Return to normal activities as told by your doctor. °· Ask your doctor what stretches and exercises you can do. °· Do not bend or lift anything heavy as told by your doctor. °GET HELP IF: °· Your wound becomes red, puffy (swollen), or tender to the touch. °· You are bleeding or leaking fluid from the wound. °· You are sick to your stomach (nauseous) or throw up (vomit) for more than 24 hours after the procedure. °· Your back pain does not get better. °· You have a fever. °GET HELP RIGHT AWAY IF:  °· You have bad back pain that comes on suddenly. °· You cannot control when you pee (urinate) or poop (bowel movement). °· You lose feeling (numbness) or have tingling in your legs or feet, or they become weak. °· You have sudden weakness in your arms or legs. °· You have shooting pain down your legs. °· You have chest pain or a hard time breathing. °· You feel dizzy or pass out (faint). °· Your vision changes or you cannot talk as you normally do. °Document Released: 08/24/2009 Document Revised: 06/04/2013 Document Reviewed: 02/05/2013 °[employer]® Patient Information ©2015 [employer]. This information is not intended to replace advice given to you by your health care provider. Make sure you discuss any questions you have with your health care provider. ° °

## 2014-05-28 NOTE — Sedation Documentation (Signed)
Sterile dressing applied to site.

## 2014-05-28 NOTE — Procedures (Signed)
Kyphoplasty thoracic L57 No complication No blood loss. See complete dictation in [facility].

## 2014-06-20 DIAGNOSIS — M150 Primary generalized (osteo)arthritis: Secondary | ICD-10-CM | POA: Diagnosis not present

## 2014-06-20 DIAGNOSIS — M8580 Other specified disorders of bone density and structure, unspecified site: Secondary | ICD-10-CM | POA: Diagnosis not present

## 2014-06-20 DIAGNOSIS — M1A09X0 Idiopathic chronic gout, multiple sites, without tophus (tophi): Secondary | ICD-10-CM | POA: Diagnosis not present

## 2014-06-20 DIAGNOSIS — M3312 Other dermatopolymyositis with myopathy: Secondary | ICD-10-CM | POA: Diagnosis not present

## 2014-06-24 DIAGNOSIS — M3390 Dermatopolymyositis, unspecified, organ involvement unspecified: Secondary | ICD-10-CM | POA: Diagnosis not present

## 2014-07-01 DIAGNOSIS — I10 Essential (primary) hypertension: Secondary | ICD-10-CM | POA: Diagnosis not present

## 2014-07-01 DIAGNOSIS — S86091A Other specified injury of right Achilles tendon, initial encounter: Secondary | ICD-10-CM | POA: Diagnosis not present

## 2014-07-01 DIAGNOSIS — R609 Edema, unspecified: Secondary | ICD-10-CM | POA: Diagnosis not present

## 2014-07-01 DIAGNOSIS — S8991XA Unspecified injury of right lower leg, initial encounter: Secondary | ICD-10-CM | POA: Diagnosis not present

## 2014-07-01 DIAGNOSIS — M3312 Other dermatopolymyositis with myopathy: Secondary | ICD-10-CM | POA: Diagnosis not present

## 2014-07-01 DIAGNOSIS — M1A09X0 Idiopathic chronic gout, multiple sites, without tophus (tophi): Secondary | ICD-10-CM | POA: Diagnosis not present

## 2014-07-21 ENCOUNTER — Other Ambulatory Visit: Payer: Self-pay

## 2014-07-21 MED ORDER — FUROSEMIDE 40 MG PO TABS: 40.0000 mg | ORAL_TABLET | Freq: Every day | ORAL | Status: DC | PRN

## 2014-07-24 DIAGNOSIS — Z1231 Encounter for screening mammogram for malignant neoplasm of breast: Secondary | ICD-10-CM | POA: Diagnosis not present

## 2014-07-28 ENCOUNTER — Other Ambulatory Visit: Payer: Self-pay | Admitting: Nurse Practitioner

## 2014-07-28 MED ORDER — FUROSEMIDE 40 MG PO TABS: 40.0000 mg | ORAL_TABLET | Freq: Every day | ORAL | Status: DC | PRN

## 2014-07-30 ENCOUNTER — Other Ambulatory Visit: Payer: Self-pay

## 2014-07-30 DIAGNOSIS — E780 Pure hypercholesterolemia: Secondary | ICD-10-CM | POA: Diagnosis not present

## 2014-07-30 DIAGNOSIS — Z0000 Encounter for general adult medical examination without abnormal findings: Secondary | ICD-10-CM | POA: Diagnosis not present

## 2014-07-30 DIAGNOSIS — E559 Vitamin D deficiency, unspecified: Secondary | ICD-10-CM | POA: Diagnosis not present

## 2014-07-30 MED ORDER — FUROSEMIDE 40 MG PO TABS: 40.0000 mg | ORAL_TABLET | Freq: Every day | ORAL | Status: DC | PRN

## 2014-07-31 ENCOUNTER — Other Ambulatory Visit: Payer: Self-pay

## 2014-07-31 ENCOUNTER — Ambulatory Visit (INDEPENDENT_AMBULATORY_CARE_PROVIDER_SITE_OTHER): Payer: Medicare Other | Admitting: Physician Assistant

## 2014-07-31 ENCOUNTER — Encounter: Payer: Self-pay | Admitting: Physician Assistant

## 2014-07-31 VITALS — BP 110/70 | HR 82 | Ht 60.0 in | Wt 252.0 lb

## 2014-07-31 DIAGNOSIS — I480 Paroxysmal atrial fibrillation: Secondary | ICD-10-CM

## 2014-07-31 DIAGNOSIS — I2510 Atherosclerotic heart disease of native coronary artery without angina pectoris: Secondary | ICD-10-CM | POA: Diagnosis not present

## 2014-07-31 DIAGNOSIS — I10 Essential (primary) hypertension: Secondary | ICD-10-CM | POA: Diagnosis not present

## 2014-07-31 DIAGNOSIS — E785 Hyperlipidemia, unspecified: Secondary | ICD-10-CM | POA: Diagnosis not present

## 2014-07-31 DIAGNOSIS — I5032 Chronic diastolic (congestive) heart failure: Secondary | ICD-10-CM

## 2014-07-31 MED ORDER — FUROSEMIDE 40 MG PO TABS: 40.0000 mg | ORAL_TABLET | Freq: Every day | ORAL | Status: DC | PRN

## 2014-07-31 NOTE — Patient Instructions (Signed)
Your physician recommends that you schedule a follow-up appointment in: 2-3 [place] [address]. [place]  Your physician recommends that you continue on your current medications as directed. Please refer to the Current Medication list given to you today.

## 2014-07-31 NOTE — Progress Notes (Signed)
Cardiology Office Note   Date:  07/31/2014   ID:  [name], DOB November 23, 1945, MRN [id number]  Patient Care Team: Tommy Medal, MD as PCP - General (Internal Medicine) [name], MD as Consulting Physician (Clinical Cardiac Electrophysiology) Hennie Duos, MD as Consulting Physician (Rheumatology)     Chief Complaint  Patient presents with  . Atrial Fibrillation    Follow up     History of Present Illness: [name] is a 69 y.o. female with a hx of atrial fibrillation, CAD, HTN, steroid-induced diabetes, dermatomyositis, HL, hypothyroidism, sleep apnea, obesity. LHC in 03/2006 demonstrated non-obstructive CAD. There was no change compared to 2005 and medical therapy continued. Patient saw Dr. Verl Blalock in 08/2012 wiith recurrent atrial fibrillation. She had been controlled on sotalol for many years. Sotalol was stopped and she was placed on amiodarone. She had some side effects to the Amiodarone (tremor) and this was ultimately d/c'd. Lexiscan Myoview in 10/2012 was normal.   Last seen by Dr. Rayann Heman 11/2013. She remained in sinus rhythm. He commented that of atrial fibrillation recurs, he would recommend restarting sotalol. Tikosyn is also an option. She suffered a T12 compression fracture in September 2015 and underwent kyphoplasty. She subsequently underwent T11 kyphoplasty and 05/2014.   She returns for follow-up.  Her primary care provider took her off of Coumadin after she presented to the emergency room several months ago with a sore on her right arm that continued to bleed.  After further questioning it sounds as though her primary care physician took her off of Coumadin because of her increased risk of falling. She's fallen several times in the last year. This is related to increased weakness from a flareup of her dermatomyositis.  Her flareup has caused significant rash, weakness, tremor. She is followed by rheumatology. She denies chest discomfort. She has dyspnea with activity  that seems to be related to her increased weakness. She denies orthopnea, PND. She takes Lasix as needed for increasing LE edema. She denies syncope or rapid palpitations.   Studies/Reports Reviewed Today:  None    Past Medical History  Diagnosis Date  . Atrial fibrillation 09/20/2008  . HYPERTENSION, UNSPECIFIED 09/20/2008  . LEG EDEMA, BILATERAL 09/20/2008  . Internal hemorrhoids   . GERD (gastroesophageal reflux disease)   . Hiatal hernia   . Dermatomyositis   . Morbid obesity   . Osteoarthritis   . Hypothyroidism   . Sleep apnea   . History of pancreatitis   . Chronic diastolic heart failure   . Shingles   . Gall stones   . Gout   . Deviated septum   . Hyperlipidemia   . Obesity   . Continuous urine leakage   . CAD (coronary artery disease)     a. LHC 03/2006: EF 65%, mid LAD 40-50%, ostial D1 70-80%, normal circumflex, normal RCA;  b. Lexiscan Myoview 09/2010: No ischemia or scar, EF 75%;  c. Lex MV 5/14:  Normal, no ischemia, EF 71%  . Hx of echocardiogram     Echo 8/14:  Mild LVH, EF 55-65%, normal wall motion, Tr AI, mild MR, mod TR, PASP 45  . History of PFTs     PFTs 10/2012: normal.     Past Surgical History  Procedure Laterality Date  . Total abdominal hysterectomy    . Nasal septum surgery       Current Outpatient Prescriptions  Medication Sig Dispense Refill  . acetaminophen (TYLENOL) 500 MG tablet Take 1,000 mg by mouth every  6 (six) hours as needed for mild pain.    Marland Kitchen aspirin EC 325 MG tablet Take 325 mg by mouth daily.    . benzonatate (TESSALON) 200 MG capsule Take 200 mg by mouth 3 (three) times daily as needed for cough.    . Cholecalciferol (VITAMIN D3) 2000 UNITS TABS Take 2,000 Units by mouth daily.     . cyclobenzaprine (FLEXERIL) 10 MG tablet Take 1 tablet (10 mg total) by mouth 2 (two) times daily. 30 tablet 0  . furosemide (LASIX) 40 MG tablet Take 1 tablet (40 mg total) by mouth daily as needed for fluid. 30 tablet 3  . hydroxychloroquine  (PLAQUENIL) 200 MG tablet Take 200 mg by mouth 2 (two) times daily.     Marland Kitchen levothyroxine (SYNTHROID, LEVOTHROID) 112 MCG tablet Take 112 mcg by mouth daily before breakfast.    . lidocaine (LIDODERM) 5 % Place 1 patch onto the skin daily. Remove & Discard patch within 12 hours or as directed by MD 30 patch 0  . methotrexate (RHEUMATREX) 2.5 MG tablet Take 12.5 mg by mouth once a week.     . metoprolol succinate (TOPROL-XL) 25 MG 24 hr tablet Take 1 tablet by mouth  daily 30 tablet 6  . metoprolol succinate (TOPROL-XL) 25 MG 24 hr tablet Take 25 mg by mouth daily.    . Multiple Vitamin (MULTIVITAMIN) tablet Take 1 tablet by mouth daily.      . nitroGLYCERIN (NITROSTAT) 0.4 MG SL tablet Place 1 tablet (0.4 mg total) under the tongue every 5 (five) minutes as needed. (Patient taking differently: Place 0.4 mg under the tongue every 5 (five) minutes as needed for chest pain. ) 25 tablet 12  . nystatin cream (MYCOSTATIN) Apply 1 application topically 2 (two) times daily.    Marland Kitchen OVER THE COUNTER MEDICATION Place 1 drop into both eyes daily as needed (for dry eyes. Advanced eye relief).    Marland Kitchen oxyCODONE (OXY IR/ROXICODONE) 5 MG immediate release tablet Take 1 tablet (5 mg total) by mouth every 4 (four) hours as needed for moderate pain. 30 tablet 0  . pantoprazole (PROTONIX) 40 MG tablet Take 40 mg by mouth daily.    . potassium chloride SA (K-DUR,KLOR-CON) 20 MEQ tablet Take 1 tablet by mouth two  times daily 60 tablet 6  . potassium chloride SA (K-DUR,KLOR-CON) 20 MEQ tablet Take 20 mEq by mouth 2 (two) times daily.    . predniSONE (DELTASONE) 1 MG tablet Take 5 mg by mouth daily.     . predniSONE (DELTASONE) 5 MG tablet Take 5 mg by mouth daily with breakfast.    . promethazine-codeine (PHENERGAN WITH CODEINE) 6.25-10 MG/5ML syrup Take 5 mLs by mouth 2 (two) times daily as needed for cough.    . warfarin (COUMADIN) 5 MG tablet Take 2.5-5 mg by mouth daily at 6 PM. 5mg  on Tuesday and Thursday . 2.5 mg  Su,Mo,We,Fr,Sa     No current facility-administered medications for this visit.    Allergies:   Butoconazole and Lipitor    Social History:  The patient  reports that she has never smoked. She has never used smokeless tobacco. She reports that she does not drink alcohol or use illicit drugs.   Family History:  The patient's family history includes Colon cancer in her paternal uncle; Coronary artery disease in her father; Heart attack in her brother; Kidney disease in her brother.    ROS:   Please see the history of present illness.   Review  of Systems  Constitution: Positive for malaise/fatigue.  HENT: Positive for headaches.   Respiratory: Positive for cough.   Hematologic/Lymphatic: Bruises/bleeds easily.  Skin: Positive for rash.  Musculoskeletal: Positive for back pain.  Gastrointestinal: Positive for abdominal pain, constipation and diarrhea.  Genitourinary: Positive for hematuria.  Neurological: Positive for loss of balance.      PHYSICAL EXAM: VS:  BP 110/70 mmHg  Pulse 82  Ht 5' (1.524 m)  Wt 252 lb (114.306 kg)  BMI 49.22 kg/m2    Wt Readings from Last 3 Encounters:  02/18/14 260 lb (117.935 kg)  12/09/13 258 lb (117.028 kg)  08/26/13 256 lb (116.121 kg)     GEN: Well nourished, well developed, in no acute distress HEENT: normal Neck: I cannot appreciate JVD, no masses Cardiac:  Normal S1/S2, RRR; no murmur, no rubs or gallops, 1+ edema  Respiratory:  clear to auscultation bilaterally, no wheezing, rhonchi or rales. GI: soft, nontender, nondistended, + BS MS: no deformity or atrophy Skin: warm and dry; diffuse maculopapular rash to her forehead Neuro:  CNs II-XII intact, BUE intention tremor noted Psych: Normal affect   EKG:  EKG is ordered today.  It demonstrates:   NSR, HR 82, normal axis, low voltage, poor R-wave progression, no change from prior tracing   Recent Labs: 02/18/2014: Pro B Natriuretic peptide (BNP) 143.6* 02/20/2014: TSH  8.840* 04/19/2014: ALT 15; BUN 15; Creatinine 1.02; Potassium 4.7; Sodium 142 05/28/2014: Hemoglobin 13.0; Platelets 136*    Lipid Panel No results found for: CHOL, TRIG, HDL, CHOLHDL, VLDL, LDLCALC, LDLDIRECT    ASSESSMENT AND PLAN:  1.  Paroxysmal Atrial Fibrillation: Maintaining NSR. Coumadin has been discontinued by primary care. She apparently has had a significant number of falls in the past year. CHADS2-VASc=4.  Annual stroke risk is 6.5%. I had a long d/w her and her husband regarding the risks and benefits for anticoagulation.  I agree that with her profound weakness, she is not likely a good candidate for Coumadin at this time.  However, if her strength and balance improves, we should consider resuming anticoagulation. Consider resuming Sotalol vs Tikosyn if she has recurrent AFib. 2.  CAD: No angina. She is now on ASA as she off of coumadin. She is not on statin Rx due to muscle weakness in the setting of dermatomyositis.  Continue beta blocker. 3.  Hypertension: Controlled.   4.  Diastolic CHF: Volume stable. She takes Lasix as needed for increased weight or swelling. Request recent labs performed by primary care. 5.  Hyperlipidemia:  She is not on statin due to worsening weakness in the setting of dermatomyositis. 6.  Dermatomyositis: Follow-up with rheumatology as planned.  Current medicines are reviewed at length with the patient today.  The patient has concerns regarding medicines.  The following changes have been made:  As above.  Labs/ tests ordered today include:   Orders Placed This Encounter  Procedures  . EKG 12-Lead     Disposition:   FU with Dr. Rayann Heman in 3 months.   Signed, Versie Starks, MHS 07/31/2014 8:51 AM    [facility] [address], [place], [place]  [address] Phone: [phone]; Fax: [phone]

## 2014-08-01 NOTE — Progress Notes (Signed)
Labs 07/30/14:  Hgb 12, ALT 34, AST 109, K 4, LDL 124, TSH 6.450, BNP 319 (From PCP office). Richardson Dopp, PA-C   08/01/2014 2:35 PM

## 2014-08-05 DIAGNOSIS — M3390 Dermatopolymyositis, unspecified, organ involvement unspecified: Secondary | ICD-10-CM | POA: Diagnosis not present

## 2014-08-05 DIAGNOSIS — R7989 Other specified abnormal findings of blood chemistry: Secondary | ICD-10-CM | POA: Diagnosis not present

## 2014-08-05 DIAGNOSIS — E039 Hypothyroidism, unspecified: Secondary | ICD-10-CM | POA: Diagnosis not present

## 2014-08-05 DIAGNOSIS — R1011 Right upper quadrant pain: Secondary | ICD-10-CM | POA: Diagnosis not present

## 2014-08-06 ENCOUNTER — Other Ambulatory Visit: Payer: Self-pay | Admitting: Internal Medicine

## 2014-08-06 DIAGNOSIS — R1011 Right upper quadrant pain: Secondary | ICD-10-CM

## 2014-08-13 ENCOUNTER — Ambulatory Visit
Admission: RE | Admit: 2014-08-13 | Discharge: 2014-08-13 | Disposition: A | Discharge status: Home / Self Care | Payer: Medicare Other | Source: Ambulatory Visit | Attending: Internal Medicine | Admitting: Internal Medicine

## 2014-08-13 DIAGNOSIS — R1011 Right upper quadrant pain: Secondary | ICD-10-CM

## 2014-08-13 DIAGNOSIS — K8020 Calculus of gallbladder without cholecystitis without obstruction: Secondary | ICD-10-CM | POA: Diagnosis not present

## 2014-08-13 DIAGNOSIS — M3312 Other dermatopolymyositis with myopathy: Secondary | ICD-10-CM | POA: Diagnosis not present

## 2014-08-13 DIAGNOSIS — M1A09X0 Idiopathic chronic gout, multiple sites, without tophus (tophi): Secondary | ICD-10-CM | POA: Diagnosis not present

## 2014-08-13 DIAGNOSIS — M150 Primary generalized (osteo)arthritis: Secondary | ICD-10-CM | POA: Diagnosis not present

## 2014-08-13 DIAGNOSIS — M8580 Other specified disorders of bone density and structure, unspecified site: Secondary | ICD-10-CM | POA: Diagnosis not present

## 2014-08-20 ENCOUNTER — Other Ambulatory Visit: Payer: Self-pay | Admitting: *Deleted

## 2014-08-20 MED ORDER — POTASSIUM CHLORIDE CRYS ER 20 MEQ PO TBCR: EXTENDED_RELEASE_TABLET | ORAL | Status: DC

## 2014-08-20 MED ORDER — METOPROLOL SUCCINATE ER 25 MG PO TB24: ORAL_TABLET | ORAL | Status: DC

## 2014-08-21 ENCOUNTER — Emergency Department (HOSPITAL_COMMUNITY)
Admission: EM | Admit: 2014-08-21 | Discharge: 2014-08-22 | Disposition: A | Discharge status: Home / Self Care | Payer: Medicare Other | Attending: Emergency Medicine | Admitting: Emergency Medicine

## 2014-08-21 ENCOUNTER — Encounter (HOSPITAL_COMMUNITY): Payer: Self-pay

## 2014-08-21 DIAGNOSIS — Z8619 Personal history of other infectious and parasitic diseases: Secondary | ICD-10-CM | POA: Insufficient documentation

## 2014-08-21 DIAGNOSIS — E039 Hypothyroidism, unspecified: Secondary | ICD-10-CM | POA: Diagnosis not present

## 2014-08-21 DIAGNOSIS — Z8669 Personal history of other diseases of the nervous system and sense organs: Secondary | ICD-10-CM | POA: Diagnosis not present

## 2014-08-21 DIAGNOSIS — Z87448 Personal history of other diseases of urinary system: Secondary | ICD-10-CM | POA: Diagnosis not present

## 2014-08-21 DIAGNOSIS — R112 Nausea with vomiting, unspecified: Secondary | ICD-10-CM | POA: Diagnosis not present

## 2014-08-21 DIAGNOSIS — K219 Gastro-esophageal reflux disease without esophagitis: Secondary | ICD-10-CM | POA: Diagnosis not present

## 2014-08-21 DIAGNOSIS — Z8709 Personal history of other diseases of the respiratory system: Secondary | ICD-10-CM | POA: Insufficient documentation

## 2014-08-21 DIAGNOSIS — I2510 Atherosclerotic heart disease of native coronary artery without angina pectoris: Secondary | ICD-10-CM | POA: Diagnosis not present

## 2014-08-21 DIAGNOSIS — M1990 Unspecified osteoarthritis, unspecified site: Secondary | ICD-10-CM | POA: Insufficient documentation

## 2014-08-21 DIAGNOSIS — Z7952 Long term (current) use of systemic steroids: Secondary | ICD-10-CM | POA: Insufficient documentation

## 2014-08-21 DIAGNOSIS — I10 Essential (primary) hypertension: Secondary | ICD-10-CM | POA: Diagnosis not present

## 2014-08-21 DIAGNOSIS — R197 Diarrhea, unspecified: Secondary | ICD-10-CM | POA: Diagnosis not present

## 2014-08-21 DIAGNOSIS — Z7982 Long term (current) use of aspirin: Secondary | ICD-10-CM | POA: Diagnosis not present

## 2014-08-21 DIAGNOSIS — Z79899 Other long term (current) drug therapy: Secondary | ICD-10-CM | POA: Diagnosis not present

## 2014-08-21 DIAGNOSIS — I5032 Chronic diastolic (congestive) heart failure: Secondary | ICD-10-CM | POA: Diagnosis not present

## 2014-08-21 LAB — COMPREHENSIVE METABOLIC PANEL
ALT: 62 U/L — ABNORMAL HIGH (ref 0–35)
AST: 93 U/L — ABNORMAL HIGH (ref 0–37)
Albumin: 2.7 g/dL — ABNORMAL LOW (ref 3.5–5.2)
Alkaline Phosphatase: 73 U/L (ref 39–117)
Anion gap: 11 (ref 5–15)
BUN: 30 mg/dL — ABNORMAL HIGH (ref 6–23)
CO2: 24 mmol/L (ref 19–32)
Calcium: 8.6 mg/dL (ref 8.4–10.5)
Chloride: 112 mmol/L (ref 96–112)
Creatinine, Ser: 1.22 mg/dL — ABNORMAL HIGH (ref 0.50–1.10)
GFR calc Af Amer: 52 mL/min — ABNORMAL LOW (ref >90)
GFR calc non Af Amer: 44 mL/min — ABNORMAL LOW (ref >90)
Glucose, Bld: 91 mg/dL (ref 70–99)
Potassium: 3.7 mmol/L (ref 3.5–5.1)
Sodium: 147 mmol/L — ABNORMAL HIGH (ref 135–145)
Total Bilirubin: 1 mg/dL (ref 0.3–1.2)
Total Protein: 6.7 g/dL (ref 6.0–8.3)

## 2014-08-21 LAB — CBC WITH DIFFERENTIAL/PLATELET
Basophils Absolute: 0 10*3/uL (ref 0.0–0.1)
Basophils Relative: 0 % (ref 0–1)
Eosinophils Absolute: 0 10*3/uL (ref 0.0–0.7)
Eosinophils Relative: 0 % (ref 0–5)
HCT: 41.7 % (ref 36.0–46.0)
Hemoglobin: 13.3 g/dL (ref 12.0–15.0)
Lymphocytes Relative: 6 % — ABNORMAL LOW (ref 12–46)
Lymphs Abs: 0.7 10*3/uL (ref 0.7–4.0)
MCH: 32.7 pg (ref 26.0–34.0)
MCHC: 31.9 g/dL (ref 30.0–36.0)
MCV: 102.5 fL — ABNORMAL HIGH (ref 78.0–100.0)
Monocytes Absolute: 1.1 10*3/uL — ABNORMAL HIGH (ref 0.1–1.0)
Monocytes Relative: 10 % (ref 3–12)
Neutro Abs: 10.1 10*3/uL — ABNORMAL HIGH (ref 1.7–7.7)
Neutrophils Relative %: 85 % — ABNORMAL HIGH (ref 43–77)
Platelets: 91 10*3/uL — ABNORMAL LOW (ref 150–400)
RBC: 4.07 MIL/uL (ref 3.87–5.11)
RDW: 16.6 % — ABNORMAL HIGH (ref 11.5–15.5)
WBC: 11.9 10*3/uL — ABNORMAL HIGH (ref 4.0–10.5)

## 2014-08-21 MED ORDER — ONDANSETRON HCL 4 MG/2ML IJ SOLN
4.0000 mg | Freq: Once | INTRAMUSCULAR | Status: AC
  Administered 2014-08-21: 4 mg via INTRAVENOUS
  Filled 2014-08-21: qty 2

## 2014-08-21 MED ORDER — SODIUM CHLORIDE 0.9 % IV BOLUS (SEPSIS)
1500.0000 mL | Freq: Once | INTRAVENOUS | Status: AC
  Administered 2014-08-21: 1000 mL via INTRAVENOUS

## 2014-08-21 NOTE — ED Notes (Signed)
Pt reports vomiting and diarrhea since 730 this morning.  Sts she has probably had 40 episodes of both.  Sts she feels weaker than normal.

## 2014-08-21 NOTE — ED Notes (Signed)
Attempted IV access, unsuccessful.  

## 2014-08-22 MED ORDER — ONDANSETRON 8 MG PO TBDP: 8.0000 mg | ORAL_TABLET | Freq: Three times a day (TID) | ORAL | Status: DC | PRN

## 2014-08-22 NOTE — ED Provider Notes (Signed)
CSN: [id number]     Arrival date & time 08/21/14  1636 History   First MD Initiated Contact with Patient 08/21/14 2026     Chief Complaint  Patient presents with  . Emesis  . Diarrhea      HPI Patient presents with nausea vomiting diarrhea since 7:30 this morning.  She denies hematemesis.  No melena or hematochezia.  Denies fevers and chills.  She states she feels weaker than normal.  Rectal temp on arrival to emergency department as 100.4.  Patient denies abdominal pain.  No chest pain shortness of breath.  Denies cough.   Past Medical History  Diagnosis Date  . Atrial fibrillation 09/20/2008  . HYPERTENSION, UNSPECIFIED 09/20/2008  . LEG EDEMA, BILATERAL 09/20/2008  . Internal hemorrhoids   . GERD (gastroesophageal reflux disease)   . Hiatal hernia   . Dermatomyositis   . Morbid obesity   . Osteoarthritis   . Hypothyroidism   . Sleep apnea   . History of pancreatitis   . Chronic diastolic heart failure   . Shingles   . Gall stones   . Gout   . Deviated septum   . Hyperlipidemia   . Obesity   . Continuous urine leakage   . CAD (coronary artery disease)     a. LHC 03/2006: EF 65%, mid LAD 40-50%, ostial D1 70-80%, normal circumflex, normal RCA;  b. Lexiscan Myoview 09/2010: No ischemia or scar, EF 75%;  c. Lex MV 5/14:  Normal, no ischemia, EF 71%  . Hx of echocardiogram     Echo 8/14:  Mild LVH, EF 55-65%, normal wall motion, Tr AI, mild MR, mod TR, PASP 45  . History of PFTs     PFTs 10/2012: normal.    Past Surgical History  Procedure Laterality Date  . Total abdominal hysterectomy    . Nasal septum surgery     Family History  Problem Relation Age of Onset  . Heart attack Brother     x 2  . Coronary artery disease Father   . Colon cancer Paternal Uncle   . Kidney disease Brother   . Stroke Neg Hx    History  Substance Use Topics  . Smoking status: Never Smoker   . Smokeless tobacco: Never Used  . Alcohol Use: No   OB History    No data available      Review of Systems  All other systems reviewed and are negative.     Allergies  Butoconazole and Lipitor  Home Medications   Prior to Admission medications   Medication Sig Start Date End Date Taking? Authorizing Provider  acetaminophen (TYLENOL) 500 MG tablet Take 1,000 mg by mouth every 6 (six) hours as needed for mild pain.   Yes Historical Provider, MD  aspirin EC 325 MG tablet Take 325 mg by mouth daily.   Yes Historical Provider, MD  Cholecalciferol (VITAMIN D3) 2000 UNITS TABS Take 2,000 Units by mouth daily.    Yes Historical Provider, MD  clobetasol cream (TEMOVATE) 6.60 % Apply 1 application topically 2 (two) times daily.  06/24/14  Yes Historical Provider, MD  furosemide (LASIX) 40 MG tablet Take 1 tablet (40 mg total) by mouth daily as needed for fluid. 07/31/14  Yes Larey Dresser, MD  hydrocortisone 2.5 % cream Apply 1 application topically 2 (two) times daily.  06/24/14  Yes Historical Provider, MD  hydroxychloroquine (PLAQUENIL) 200 MG tablet Take 200 mg by mouth 2 (two) times daily.  08/06/10  Yes Historical Provider,  MD  levothyroxine (SYNTHROID, LEVOTHROID) 125 MCG tablet Take 125 mcg by mouth daily before breakfast.   Yes Historical Provider, MD  metoprolol succinate (TOPROL-XL) 25 MG 24 hr tablet Take 1 tablet by mouth  daily 08/20/14  Yes [name], MD  Multiple Vitamin (MULTIVITAMIN) tablet Take 1 tablet by mouth daily.     Yes Historical Provider, MD  nystatin cream (MYCOSTATIN) Apply 1 application topically every evening.    Yes Historical Provider, MD  OVER THE COUNTER MEDICATION Place 1 drop into both eyes daily as needed (for dry eyes. Advanced eye relief).   Yes Historical Provider, MD  pantoprazole (PROTONIX) 40 MG tablet Take 40 mg by mouth daily. 08/09/10  Yes Historical Provider, MD  potassium chloride SA (K-DUR,KLOR-CON) 20 MEQ tablet Take 1 tablet by mouth two  times daily 08/20/14  Yes [name], MD  predniSONE (DELTASONE) 20 MG tablet Take 60 mg by  mouth daily with breakfast.   Yes Historical Provider, MD  nitroGLYCERIN (NITROSTAT) 0.4 MG SL tablet Place 1 tablet (0.4 mg total) under the tongue every 5 (five) minutes as needed. Patient taking differently: Place 0.4 mg under the tongue every 5 (five) minutes as needed for chest pain.  08/26/13   Liliane Shi, PA-C  predniSONE (DELTASONE) 5 MG tablet Take 5 mg by mouth daily with breakfast.    Historical Provider, MD   BP 116/37 mmHg  Pulse 99  Temp(Src) 99.4 F (37.4 C) (Rectal)  Resp 16  SpO2 100% Physical Exam  Constitutional: She is oriented to person, place, and time. She appears well-developed and well-nourished. No distress.  HENT:  Head: Normocephalic and atraumatic.  Eyes: EOM are normal.  Neck: Normal range of motion.  Cardiovascular: Normal rate, regular rhythm and normal heart sounds.   Pulmonary/Chest: Effort normal and breath sounds normal.  Abdominal: Soft. She exhibits no distension. There is no tenderness.  Musculoskeletal: Normal range of motion.  Neurological: She is alert and oriented to person, place, and time.  Skin: Skin is warm and dry.  Psychiatric: She has a normal mood and affect. Judgment normal.  Nursing note and vitals reviewed.   ED Course  Procedures (including critical care time) Labs Review Labs Reviewed  CBC WITH DIFFERENTIAL/PLATELET - Abnormal; Notable for the following:    WBC 11.9 (*)    MCV 102.5 (*)    RDW 16.6 (*)    Platelets 91 (*)    Neutrophils Relative % 85 (*)    Neutro Abs 10.1 (*)    Lymphocytes Relative 6 (*)    Monocytes Absolute 1.1 (*)    All other components within normal limits  COMPREHENSIVE METABOLIC PANEL - Abnormal; Notable for the following:    Sodium 147 (*)    BUN 30 (*)    Creatinine, Ser 1.22 (*)    Albumin 2.7 (*)    AST 93 (*)    ALT 62 (*)    GFR calc non Af Amer 44 (*)    GFR calc Af Amer 52 (*)    All other components within normal limits    Imaging Review No results found.   EKG  Interpretation None      MDM   Final diagnoses:  Nausea    Patient feels better after IV fluids.  Discharge home in good condition.  Primary care follow-up.  Home with Zofran.  Repeat abdominal exam is benign.  Vital signs are normalizing.    Jola Schmidt, MD 08/22/14 [phone]

## 2014-08-22 NOTE — Discharge Instructions (Signed)

## 2014-08-27 DIAGNOSIS — M150 Primary generalized (osteo)arthritis: Secondary | ICD-10-CM | POA: Diagnosis not present

## 2014-08-27 DIAGNOSIS — M1A09X0 Idiopathic chronic gout, multiple sites, without tophus (tophi): Secondary | ICD-10-CM | POA: Diagnosis not present

## 2014-08-27 DIAGNOSIS — M8580 Other specified disorders of bone density and structure, unspecified site: Secondary | ICD-10-CM | POA: Diagnosis not present

## 2014-08-27 DIAGNOSIS — M3312 Other dermatopolymyositis with myopathy: Secondary | ICD-10-CM | POA: Diagnosis not present

## 2014-09-16 DIAGNOSIS — H35372 Puckering of macula, left eye: Secondary | ICD-10-CM | POA: Diagnosis not present

## 2014-09-16 DIAGNOSIS — H3531 Nonexudative age-related macular degeneration: Secondary | ICD-10-CM | POA: Diagnosis not present

## 2014-09-17 DIAGNOSIS — E039 Hypothyroidism, unspecified: Secondary | ICD-10-CM | POA: Diagnosis not present

## 2014-09-17 DIAGNOSIS — R7989 Other specified abnormal findings of blood chemistry: Secondary | ICD-10-CM | POA: Diagnosis not present

## 2014-09-17 DIAGNOSIS — M3312 Other dermatopolymyositis with myopathy: Secondary | ICD-10-CM | POA: Diagnosis not present

## 2014-09-19 DIAGNOSIS — M1A09X0 Idiopathic chronic gout, multiple sites, without tophus (tophi): Secondary | ICD-10-CM | POA: Diagnosis not present

## 2014-09-19 DIAGNOSIS — M150 Primary generalized (osteo)arthritis: Secondary | ICD-10-CM | POA: Diagnosis not present

## 2014-09-19 DIAGNOSIS — M8580 Other specified disorders of bone density and structure, unspecified site: Secondary | ICD-10-CM | POA: Diagnosis not present

## 2014-09-19 DIAGNOSIS — M3312 Other dermatopolymyositis with myopathy: Secondary | ICD-10-CM | POA: Diagnosis not present

## 2014-09-22 DIAGNOSIS — R0602 Shortness of breath: Secondary | ICD-10-CM | POA: Diagnosis not present

## 2014-09-22 DIAGNOSIS — R609 Edema, unspecified: Secondary | ICD-10-CM | POA: Diagnosis not present

## 2014-09-22 DIAGNOSIS — E870 Hyperosmolality and hypernatremia: Secondary | ICD-10-CM | POA: Diagnosis not present

## 2014-09-22 DIAGNOSIS — B370 Candidal stomatitis: Secondary | ICD-10-CM | POA: Diagnosis not present

## 2014-09-22 DIAGNOSIS — L0390 Cellulitis, unspecified: Secondary | ICD-10-CM | POA: Diagnosis not present

## 2014-09-22 DIAGNOSIS — N390 Urinary tract infection, site not specified: Secondary | ICD-10-CM | POA: Diagnosis not present

## 2014-09-29 ENCOUNTER — Ambulatory Visit (INDEPENDENT_AMBULATORY_CARE_PROVIDER_SITE_OTHER): Payer: Medicare Other | Admitting: Internal Medicine

## 2014-09-29 ENCOUNTER — Encounter: Payer: Self-pay | Admitting: Internal Medicine

## 2014-09-29 VITALS — HR 82 | Ht 60.0 in | Wt 244.0 lb

## 2014-09-29 DIAGNOSIS — I2510 Atherosclerotic heart disease of native coronary artery without angina pectoris: Secondary | ICD-10-CM | POA: Diagnosis not present

## 2014-09-29 DIAGNOSIS — I480 Paroxysmal atrial fibrillation: Secondary | ICD-10-CM | POA: Diagnosis not present

## 2014-09-29 NOTE — Patient Instructions (Signed)
Your physician wants you to follow-up in: 6 months with Richardson Dopp, PA and 12 months with Dr Vallery Ridge will receive a reminder letter in the mail two months in advance. If you don't receive a letter, please call our office to schedule the follow-up appointment.

## 2014-09-29 NOTE — Progress Notes (Signed)
PCP: Tommy Medal, MD Primary Cardiologist: previously Dr Tasia Catchings is a 69 y.o. female who presents today for routine electrophysiology followup.  Since last being seen in our clinic, the patient reports doing reasonably well.  She is unaware of any afib.  She continues to struggle with obesity/ sedenatry life style, and an inability to try to lose weight.  Her dematomyositis has been active lately requiring high dose steroids. Today, she denies symptoms of palpitations, chest pain, shortness of breath (above baseline),  lower extremity edema (above baseline), dizziness, presyncope, or syncope.  The patient is otherwise without complaint today.   Past Medical History  Diagnosis Date  . Atrial fibrillation 09/20/2008  . HYPERTENSION, UNSPECIFIED 09/20/2008  . LEG EDEMA, BILATERAL 09/20/2008  . Internal hemorrhoids   . GERD (gastroesophageal reflux disease)   . Hiatal hernia   . Dermatomyositis   . Morbid obesity   . Osteoarthritis   . Hypothyroidism   . Sleep apnea   . History of pancreatitis   . Chronic diastolic heart failure   . Shingles   . Gall stones   . Gout   . Deviated septum   . Hyperlipidemia   . Obesity   . Continuous urine leakage   . CAD (coronary artery disease)     a. LHC 03/2006: EF 65%, mid LAD 40-50%, ostial D1 70-80%, normal circumflex, normal RCA;  b. Lexiscan Myoview 09/2010: No ischemia or scar, EF 75%;  c. Lex MV 5/14:  Normal, no ischemia, EF 71%  . Hx of echocardiogram     Echo 8/14:  Mild LVH, EF 55-65%, normal wall motion, Tr AI, mild MR, mod TR, PASP 45  . History of PFTs     PFTs 10/2012: normal.    Past Surgical History  Procedure Laterality Date  . Total abdominal hysterectomy    . Nasal septum surgery      Current Outpatient Prescriptions  Medication Sig Dispense Refill  . acetaminophen (TYLENOL) 500 MG tablet Take 1,000 mg by mouth every 6 (six) hours as needed for mild pain.    Marland Kitchen aspirin EC 325 MG tablet Take 325 mg by mouth daily.     . Cholecalciferol (VITAMIN D3) 2000 UNITS TABS Take 2,000 Units by mouth daily.     . ciprofloxacin (CIPRO) 500 MG tablet Take 500 mg by mouth 2 (two) times daily.    . clobetasol cream (TEMOVATE) 9.89 % Apply 1 application topically 2 (two) times daily.   1  . folic acid (FOLVITE) 1 MG tablet Take 1 mg by mouth daily.  3  . furosemide (LASIX) 40 MG tablet Take 1 tablet (40 mg total) by mouth daily as needed for fluid. 90 tablet 1  . hydrocortisone 2.5 % cream Apply 1 application topically 2 (two) times daily.   2  . hydroxychloroquine (PLAQUENIL) 200 MG tablet Take 200 mg by mouth 2 (two) times daily.     Marland Kitchen levothyroxine (SYNTHROID, LEVOTHROID) 125 MCG tablet Take 125 mcg by mouth daily before breakfast.    . methotrexate (RHEUMATREX) 2.5 MG tablet Take 5 tablets by mouth once a week.  3  . metoprolol succinate (TOPROL-XL) 25 MG 24 hr tablet Take 1 tablet by mouth  daily 90 tablet 0  . Multiple Vitamin (MULTIVITAMIN) tablet Take 1 tablet by mouth daily.      . nitroGLYCERIN (NITROSTAT) 0.4 MG SL tablet Place 1 tablet (0.4 mg total) under the tongue every 5 (five) minutes as needed. (Patient taking differently: Place 0.4  mg under the tongue every 5 (five) minutes as needed for chest pain. ) 25 tablet 12  . nystatin (MYCOSTATIN) 100000 UNIT/ML suspension Take 5 mLs by mouth 2 (two) times daily.    Marland Kitchen nystatin cream (MYCOSTATIN) Apply 1 application topically every evening.     . ondansetron (ZOFRAN ODT) 8 MG disintegrating tablet Take 1 tablet (8 mg total) by mouth every 8 (eight) hours as needed for nausea or vomiting. 12 tablet 0  . OVER THE COUNTER MEDICATION Place 1 drop into both eyes daily as needed (for dry eyes. Advanced eye relief).    . pantoprazole (PROTONIX) 40 MG tablet Take 40 mg by mouth daily.    . potassium chloride SA (K-DUR,KLOR-CON) 20 MEQ tablet Take 1 tablet by mouth two  times daily 180 tablet 0  . predniSONE (DELTASONE) 20 MG tablet Take 10 mg by mouth daily.     .  predniSONE (DELTASONE) 5 MG tablet Take 5 mg by mouth daily with breakfast.     No current facility-administered medications for this visit.    Physical Exam: Filed Vitals:   09/29/14 1120  Pulse: 82  Height: 5' (1.524 m)  Weight: 244 lb (110.678 kg)   GEN- The patient is overweight and chronically ill appearing, alert and oriented x 3 today.  Head- normocephalic, atraumatic  Eyes- Sclera clear, conjunctiva pink  Ears- hearing intact  Oropharynx- clear  Neck- supple,   Lungs- Clear to ausculation bilaterally, normal work of breathing  Heart- Regular rate and rhythm, no murmurs, rubs or gallops, PMI not laterally displaced  GI- soft, NT, ND, + BS  Extremities- no clubbing, cyanosis, or edema   EKG today reveals sinus rhythm with PACs  Assessment and Plan:  1. Atrial fibrillation  Doing well off of AAD therapy. Her tremor is better off of amiodarone.  If her afib recurs then she would prefer to restart sotalol.  Tikosyn would also be an option.  She is currently off of anticoagulation due to prior falls and is felt to be a poor candidate for coumadin going forward.  We can reconsider depending on her clinical progress.  2. CAD Continue current medical therapy  No ischemic symptoms  3. Morbid obesity Weight reduction is encouraged today.  This has been very difficult for her  4. Acute on chronic diastolic dysfunction Limited by low BP (BP by MD is 86/50)  She will return to see Richardson Dopp in 6 months I will see in a year

## 2014-09-30 DIAGNOSIS — R5383 Other fatigue: Secondary | ICD-10-CM | POA: Diagnosis not present

## 2014-09-30 DIAGNOSIS — L0390 Cellulitis, unspecified: Secondary | ICD-10-CM | POA: Diagnosis not present

## 2014-09-30 DIAGNOSIS — N390 Urinary tract infection, site not specified: Secondary | ICD-10-CM | POA: Diagnosis not present

## 2014-10-06 DIAGNOSIS — M3390 Dermatopolymyositis, unspecified, organ involvement unspecified: Secondary | ICD-10-CM | POA: Diagnosis not present

## 2014-10-06 DIAGNOSIS — L0390 Cellulitis, unspecified: Secondary | ICD-10-CM | POA: Diagnosis not present

## 2014-10-08 DIAGNOSIS — L0390 Cellulitis, unspecified: Secondary | ICD-10-CM | POA: Diagnosis not present

## 2014-10-13 DIAGNOSIS — M150 Primary generalized (osteo)arthritis: Secondary | ICD-10-CM | POA: Diagnosis not present

## 2014-10-13 DIAGNOSIS — M8580 Other specified disorders of bone density and structure, unspecified site: Secondary | ICD-10-CM | POA: Diagnosis not present

## 2014-10-13 DIAGNOSIS — M1A09X0 Idiopathic chronic gout, multiple sites, without tophus (tophi): Secondary | ICD-10-CM | POA: Diagnosis not present

## 2014-10-13 DIAGNOSIS — N390 Urinary tract infection, site not specified: Secondary | ICD-10-CM | POA: Diagnosis not present

## 2014-10-13 DIAGNOSIS — M3312 Other dermatopolymyositis with myopathy: Secondary | ICD-10-CM | POA: Diagnosis not present

## 2014-10-20 DIAGNOSIS — M6281 Muscle weakness (generalized): Secondary | ICD-10-CM | POA: Diagnosis not present

## 2014-10-20 DIAGNOSIS — M3390 Dermatopolymyositis, unspecified, organ involvement unspecified: Secondary | ICD-10-CM | POA: Diagnosis not present

## 2014-11-03 ENCOUNTER — Other Ambulatory Visit: Payer: Self-pay | Admitting: Internal Medicine

## 2014-11-03 DIAGNOSIS — L219 Seborrheic dermatitis, unspecified: Secondary | ICD-10-CM | POA: Diagnosis not present

## 2014-11-03 DIAGNOSIS — F329 Major depressive disorder, single episode, unspecified: Secondary | ICD-10-CM | POA: Diagnosis not present

## 2014-11-03 DIAGNOSIS — E559 Vitamin D deficiency, unspecified: Secondary | ICD-10-CM | POA: Diagnosis not present

## 2014-11-03 DIAGNOSIS — E039 Hypothyroidism, unspecified: Secondary | ICD-10-CM | POA: Diagnosis not present

## 2014-11-03 DIAGNOSIS — I480 Paroxysmal atrial fibrillation: Secondary | ICD-10-CM | POA: Diagnosis not present

## 2014-11-04 DIAGNOSIS — M6281 Muscle weakness (generalized): Secondary | ICD-10-CM | POA: Diagnosis not present

## 2014-11-04 DIAGNOSIS — M3390 Dermatopolymyositis, unspecified, organ involvement unspecified: Secondary | ICD-10-CM | POA: Diagnosis not present

## 2014-11-13 DIAGNOSIS — M6281 Muscle weakness (generalized): Secondary | ICD-10-CM | POA: Diagnosis not present

## 2014-11-13 DIAGNOSIS — M3390 Dermatopolymyositis, unspecified, organ involvement unspecified: Secondary | ICD-10-CM | POA: Diagnosis not present

## 2014-12-08 ENCOUNTER — Other Ambulatory Visit: Payer: Self-pay

## 2014-12-12 ENCOUNTER — Encounter: Payer: Self-pay | Admitting: Nurse Practitioner

## 2014-12-12 ENCOUNTER — Ambulatory Visit (INDEPENDENT_AMBULATORY_CARE_PROVIDER_SITE_OTHER): Payer: Medicare Other | Admitting: Nurse Practitioner

## 2014-12-12 DIAGNOSIS — I10 Essential (primary) hypertension: Secondary | ICD-10-CM | POA: Insufficient documentation

## 2014-12-12 DIAGNOSIS — R601 Generalized edema: Secondary | ICD-10-CM | POA: Diagnosis not present

## 2014-12-12 DIAGNOSIS — I5032 Chronic diastolic (congestive) heart failure: Secondary | ICD-10-CM | POA: Insufficient documentation

## 2014-12-12 DIAGNOSIS — I2510 Atherosclerotic heart disease of native coronary artery without angina pectoris: Secondary | ICD-10-CM

## 2014-12-12 DIAGNOSIS — I480 Paroxysmal atrial fibrillation: Secondary | ICD-10-CM | POA: Insufficient documentation

## 2014-12-12 DIAGNOSIS — I5033 Acute on chronic diastolic (congestive) heart failure: Secondary | ICD-10-CM | POA: Diagnosis not present

## 2014-12-12 DIAGNOSIS — E785 Hyperlipidemia, unspecified: Secondary | ICD-10-CM | POA: Insufficient documentation

## 2014-12-12 DIAGNOSIS — E7800 Pure hypercholesterolemia, unspecified: Secondary | ICD-10-CM | POA: Insufficient documentation

## 2014-12-12 DIAGNOSIS — R600 Localized edema: Secondary | ICD-10-CM | POA: Insufficient documentation

## 2014-12-12 MED ORDER — POTASSIUM CHLORIDE CRYS ER 20 MEQ PO TBCR
40.0000 meq | EXTENDED_RELEASE_TABLET | Freq: Two times a day (BID) | ORAL | Status: DC
Start: 2014-12-12 — End: 2015-01-02

## 2014-12-12 MED ORDER — FUROSEMIDE 80 MG PO TABS: 80.0000 mg | ORAL_TABLET | Freq: Two times a day (BID) | ORAL | Status: DC

## 2014-12-12 NOTE — Patient Instructions (Signed)
Medication Instructions:  Your physician has recommended you make the following change in your medication:  1- Increase Lasix to 80 mg by mouth twice daily. 2- Increase Potassium 40 mg by mouth twice daily.  Labwork: Your physician recommends that you return for lab work next week on the same day as your appointment for a BMET.  Testing/Procedures: Your physician has requested that you have an echocardiogram. Echocardiography is a painless test that uses sound waves to create images of your heart. It provides your doctor with information about the size and shape of your heart and how well your heart's chambers and valves are working. This procedure takes approximately one hour. There are no restrictions for this procedure.  Follow-Up: Your physician recommends that you schedule a follow-up appointment next week Thursday and Friday with flex scheduled   Any Other Special Instructions Will Be Listed Below (If Applicable).

## 2014-12-12 NOTE — Progress Notes (Signed)
Patient Name: [name] Date of Encounter: 12/12/2014  Primary Care Provider:  Tommy Medal, MD Primary Cardiologist:  J. Allred, MD   Chief Complaint  69 year old female with a history of morbid obesity, paroxysmal atrial fibrillation, and diastolic heart failure who presents secondary to marked volume overload.  Past Medical History   Past Medical History  Diagnosis Date  . PAF (paroxysmal atrial fibrillation)     a. CHA2DS2VASc = 5-->poor OAC candidate 2/2 falls.  . Essential hypertension   . Bilateral leg edema   . Internal hemorrhoids   . GERD (gastroesophageal reflux disease)   . Hiatal hernia   . Dermatomyositis   . Osteoarthritis   . Hypothyroidism   . Sleep apnea   . History of pancreatitis   . Chronic diastolic heart failure   . Shingles   . Gall stones     a. 08/2014 Abd U/S:  Large gallstone measuring 4.2 cm.  . Gout   . Deviated septum   . Hyperlipidemia   . Morbid obesity   . Continuous urine leakage   . CAD (coronary artery disease)     a. LHC 03/2006: EF 65%, mid LAD 40-50%, ostial D1 70-80%, normal circumflex, normal RCA;  b. Lexiscan Myoview 09/2010: No ischemia or scar, EF 75%;  c. Lex MV 5/14:  Normal, no ischemia, EF 71%  . Chronic diastolic CHF (congestive heart failure)     a. Echo 8/14:  Mild LVH, EF 55-65%, normal wall motion, Tr AI, mild MR, mod TR, PASP 45  . History of PFTs     a. PFTs 10/2012: normal.   . Cellulitis of right leg    Past Surgical History  Procedure Laterality Date  . Total abdominal hysterectomy    . Nasal septum surgery      Allergies  Allergies  Allergen Reactions  . Butoconazole Itching, Rash and Other (See Comments)    'Redness and swelling feverish ' 'worse symptoms '  . Lipitor [Atorvastatin Calcium] Other (See Comments)    'Muscle weakness'      HPI  69 year old female with the above complex problem list. She is morbidly obese with chronic lower extremity edema. She also has paroxysmal atrial  fibrillation but is not felt to be a candidate for oral anticoagulation secondary to history of falls. She also has a history of chronic diastolic congestive heart failure. She was last seen in clinic in April of this year and at that time her weight was 244 pounds. She was felt to be relatively stable at that time. Unfortunately, since then, she has been experiencing progressive lower extremity edema as well as dyspnea with minimal exertion, 3-4 pillow orthopnea, increasing abdominal girth, and 25-30 pound weight gain. Her Lasix dose was adjusted several months ago but then she ran out of Lasix to soon and it was refilled with 20 mg tablets. As a result, she went from taking 80 mg daily to 40 mg daily and she has not been able to stay ahead of the fluid. Further, she reports that because of her limited mobility she has been eating frequent restaurant and fast food meals. She was seen by her primary care provider 2 days ago and in the setting of marked volume overload and right lower extremity cellulitis, she was placed on Lasix 80 mg daily and also Keflex. Labs were drawn however those results are not currently available and the ordering provider's office is currently closed. Patient denies any history of chest pain, palpitations, PND, dizziness, syncope, or  early satiety.  Home Medications  Prior to Admission medications   Medication Sig Start Date End Date Taking? Authorizing Provider  acetaminophen (TYLENOL) 500 MG tablet Take 1,000 mg by mouth every 6 (six) hours as needed for mild pain.   Yes Historical Provider, MD  aspirin EC 325 MG tablet Take 325 mg by mouth daily.   Yes Historical Provider, MD  cephALEXin (KEFLEX) 250 MG capsule Take 250 mg by mouth 4 (four) times daily. 12/11/14  Yes Historical Provider, MD  Cholecalciferol (VITAMIN D3) 2000 UNITS TABS Take 2,000 Units by mouth daily.    Yes Historical Provider, MD  clobetasol cream (TEMOVATE) 4.31 % Apply 1 application topically 2 (two) times  daily.  06/24/14  Yes Historical Provider, MD  fluocinonide (LIDEX) 0.05 % external solution Apply 1 application topically 2 (two) times daily. 11/03/14  Yes Historical Provider, MD  folic acid (FOLVITE) 1 MG tablet Take 1 mg by mouth daily. 08/27/14  Yes Historical Provider, MD  hydrocortisone 2.5 % cream Apply 1 application topically 2 (two) times daily.  06/24/14  Yes Historical Provider, MD  hydroxychloroquine (PLAQUENIL) 200 MG tablet Take 200 mg by mouth 2 (two) times daily.  08/06/10  Yes Historical Provider, MD  levothyroxine (SYNTHROID, LEVOTHROID) 137 MCG tablet Take 1 tablet by mouth daily before breakfast. 12/01/14  Yes Historical Provider, MD  methotrexate (RHEUMATREX) 2.5 MG tablet Take 5 tablets by mouth once a week. 09/17/14  Yes Historical Provider, MD  metoprolol succinate (TOPROL-XL) 25 MG 24 hr tablet Take 1 tablet by mouth  daily 11/04/14  Yes [name], MD  Multiple Vitamin (MULTIVITAMIN) tablet Take 1 tablet by mouth daily.     Yes Historical Provider, MD  nitroGLYCERIN (NITROSTAT) 0.4 MG SL tablet Place 1 tablet (0.4 mg total) under the tongue every 5 (five) minutes as needed. Patient taking differently: Place 0.4 mg under the tongue every 5 (five) minutes as needed for chest pain.  08/26/13  Yes Liliane Shi, PA-C  nystatin cream (MYCOSTATIN) Apply 1 application topically every evening.    Yes Historical Provider, MD  OVER THE COUNTER MEDICATION Place 1 drop into both eyes daily as needed (for dry eyes. Advanced eye relief).   Yes Historical Provider, MD  pantoprazole (PROTONIX) 40 MG tablet Take 40 mg by mouth daily. 08/09/10  Yes Historical Provider, MD  predniSONE (DELTASONE) 5 MG tablet Take 5 mg by mouth daily with breakfast.   Yes Historical Provider, MD    Review of Systems  As above, patient has been experiencing dyspnea on exertion, bilateral worsening lower extremity edema and increasing abdominal girth, 3-4 pillow orthopnea, and weeping from the right lower extremity  for which she is now on antibiotics.  All other systems reviewed and are otherwise negative except as noted above.  Physical Exam  VS:  BP 120/62 mmHg  Pulse 75  Ht 5' (1.524 m)  Wt 270 lb 12.8 oz (122.834 kg)  BMI 52.89 kg/m2 , BMI Body mass index is 52.89 kg/(m^2). GEN: Morbidly obese female in no acute distress. HEENT: normal. Neck: Supple, obese, JVP approximately 12 cm, no carotid bruits, or masses. Cardiac: RRR, 2/6 systolic murmur heard throughout, no rubs, or gallops. No clubbing, cyanosis. She has 3+ bilateral lower extremity edema extending to the lateral thighs. She also has bilateral flank edema. Radials/DP/PT 1+ and equal bilaterally.  Respiratory:  Respirations regular and unlabored, clear to auscultation bilaterally. GI: Obese, firm, diffusely tender, BS + x 4. MS: no deformity or atrophy. Skin: warm  and dry. She has a rectangular dressing over the right anterior lower leg. Neuro:  Strength and sensation are intact. Psych: Flat affect.  Accessory Clinical Findings  ECG - regular sinus rhythm, 75, PAC, no acute ST or T changes.  Assessment & Plan  1.  Acute on chronic diastolic congestive heart failure/anasarca: Patient presents with a several month history of progressive lower extremity edema, weight gain, dyspnea, and 3-4 pillow orthopnea. She has most recently had weeping from her bilateral lower extremities with redness and ulceration developing over the right lower extremity. She is now on Keflex for cellulitis. She was recently placed on 80 mg of Lasix daily by her primary care provider. She has edema into her flanks at this point and we discussed how she may not respond adequately to oral Lasix thus requiring admission for aggressive IV diuresis. Patient would like to try aggressive oral Lasix therapy first and does not wish to be admitted today. As a result, I will increase her Lasix to 80 mg twice a day. We will also increase her potassium to 40 mEq twice a day. She  had labs drawn at [facility] the other day and we will try to get those results though currently the office is closed. We will arrange for follow-up echocardiogram and also office visit within the next week to reevaluate her status and determine whether or not she will require admission for IV diuresis. I advised that if she has any worsening of dyspnea or does not see significant response from oral Lasix therapy, she will likely need to come into the ED over the weekend. She and her husband understand this. Her heart rate and blood pressure are otherwise well controlled and I will not make any other changes to her medications.  2. Essential hypertension: Stable on beta blocker therapy.  3. Paroxysmal atrial fibrillation: In sinus rhythm today without complaints of recent palpitations. She is not an anticoagulation candidate secondary to limited mobility and history of falls. She remains on beta blocker therapy.  4. Right lower extremity cellulitis: Keflex prescribed by PCP on the 29th.  5. Morbid obesity: Patient is up 26 pounds since her last visit though most of this is fluid. Her activity is very limited in the setting of significant lower extremity edema. She had previously been doing physical therapy type exercises but this has been discontinued temporarily secondary to massive volume overload. She does not watch her diet and in fact frequently has fast food.  6. CAD (non-obstructive):  No chest pain.  Cont asa, bb, statin.  7.  Disposition: Follow-up echo and office visit with basic metabolic panel within the next 1 week.  Murray Hodgkins, NP 12/12/2014, 12:35 PM

## 2014-12-16 ENCOUNTER — Telehealth: Payer: Self-pay | Admitting: Internal Medicine

## 2014-12-16 NOTE — Telephone Encounter (Signed)
Pt was seen by Jorja Loa NP on 7/1 and he recommend  for pt to increased Lasix to 80 mg twice a day and potassium 40 mg twice a day for fluid overload and LE edema. Pt said that she was not abele to take these two medications yesterday 7/4  Due to diarrhea and vomiting.  Today she still have diarrhea. Pt was started taking antibiotics on 7/1 she thinks it is what is given her diarrhea. Pt has not weighed herself today, but she said that she feels like she has lost some fluids from her body. Pt was encouraged to start her medication tomorrow and keep her appointments she has on 7/8. Pt verbalized understanding.

## 2014-12-16 NOTE — Telephone Encounter (Signed)
New Message  Since OV w;/ Gerald Stabs on 7/1, pt has doubled on fluid and Potassium medication. Pt had diarrhea/throwing up this weekend. And due to problems pt did not take meds yesterday or today 7/4 and 7/5. Pt wanted to speak w/ RN about what to do going forward. Please call back and discuss.

## 2014-12-19 ENCOUNTER — Other Ambulatory Visit (INDEPENDENT_AMBULATORY_CARE_PROVIDER_SITE_OTHER): Payer: Medicare Other | Admitting: *Deleted

## 2014-12-19 ENCOUNTER — Ambulatory Visit (HOSPITAL_COMMUNITY): Payer: Medicare Other | Attending: Cardiovascular Disease

## 2014-12-19 ENCOUNTER — Other Ambulatory Visit: Payer: Self-pay

## 2014-12-19 ENCOUNTER — Ambulatory Visit (INDEPENDENT_AMBULATORY_CARE_PROVIDER_SITE_OTHER): Payer: Medicare Other | Admitting: Physician Assistant

## 2014-12-19 ENCOUNTER — Encounter: Payer: Self-pay | Admitting: Physician Assistant

## 2014-12-19 VITALS — BP 120/61 | HR 72 | Resp 20 | Ht 60.0 in | Wt 262.0 lb

## 2014-12-19 DIAGNOSIS — I517 Cardiomegaly: Secondary | ICD-10-CM | POA: Insufficient documentation

## 2014-12-19 DIAGNOSIS — I2510 Atherosclerotic heart disease of native coronary artery without angina pectoris: Secondary | ICD-10-CM

## 2014-12-19 DIAGNOSIS — I480 Paroxysmal atrial fibrillation: Secondary | ICD-10-CM

## 2014-12-19 DIAGNOSIS — R601 Generalized edema: Secondary | ICD-10-CM

## 2014-12-19 DIAGNOSIS — I10 Essential (primary) hypertension: Secondary | ICD-10-CM

## 2014-12-19 DIAGNOSIS — I5033 Acute on chronic diastolic (congestive) heart failure: Secondary | ICD-10-CM

## 2014-12-19 DIAGNOSIS — L03119 Cellulitis of unspecified part of limb: Secondary | ICD-10-CM | POA: Diagnosis not present

## 2014-12-19 DIAGNOSIS — R197 Diarrhea, unspecified: Secondary | ICD-10-CM

## 2014-12-19 LAB — BASIC METABOLIC PANEL
BUN: 11 mg/dL (ref 6–23)
CO2: 29 mEq/L (ref 19–32)
Calcium: 8.3 mg/dL — ABNORMAL LOW (ref 8.4–10.5)
Chloride: 109 mEq/L (ref 96–112)
Creatinine, Ser: 0.99 mg/dL (ref 0.40–1.20)
GFR: 59.21 mL/min — ABNORMAL LOW (ref >60.00)
Glucose, Bld: 89 mg/dL (ref 70–99)
Potassium: 3.4 mEq/L — ABNORMAL LOW (ref 3.5–5.1)
Sodium: 145 mEq/L (ref 135–145)

## 2014-12-19 NOTE — Progress Notes (Signed)
Cardiology Office Note Date:  12/19/2014  Patient ID:  [name], [name] 1946-03-02, MRN [id number] PCP:  Tommy Medal, MD  Cardiologist:  Allred   Chief Complaint: f/u CHF  History of Present Illness: [name] is a 69 y.o. female with history of morbid obesity, PAF, chronic diastolic CHF, GERD, sleep apnea, hypothyroidism, CAD (moderate by cath in 2007) who presents for follow-up of CHF. She has not been felt to be a candidate for anticoagulation by primary cardiologist due to history of falls. She presented to the office last week complaining of processive LEE, DOE, 3-4 pillow orthopnea, increasing abdominal girth and 25-30 lb weight gain. She has reported eating frequent restaurant and fast food meals due to decreased mobility. Her Lasix dose was adjusted several months ago but then she ran out of Lasix to soon and it was refilled with 20 mg tablets. As a result, she went from taking 80 mg daily to 40 mg daily and she has not been able to stay ahead of the fluid. She was seen by her primary care provider in late June and in the setting of marked volume overload and right lower extremity cellulitis, she was placed on Lasix 80 mg daily and also Keflex. She saw Ignacia Bayley NP on 12/12/14 who felt she had anasarca. She had edema into her flanks. Admission for IV diuresis was dicsussed but the patient wished to proceed with trial of titrated oral diuretics first. He recommended to increase Lasix to 80mg  BID and KCl 20meq BID. Last labs 08/2014 - showed WBC 11.9, Hgb 13.3, BUN 30/Cr 1.22. Last echo 01/2015 showed mild LVH, EF 55-65%, no RWMA, mild MR, PASP 47mmHg. Repeat echo pending.  On 12/16/14 she called into the office saying that she hadn't taken her medicines that day due to diarrhea and vomiting (missed 2 days). She believes this was due to the antibiotic Keflex. She held the Keflex. She took Immodium yesterday and symptoms resolved. She has not required any Immodium today and has not had a BM yet  today. She called her PCP's office and was given permission for a trial of resuming Keflex. Weight is down 6 lbs. She says diuresis slowed down when she had the diarrhea, but picked up last night. LEE has improved slightly - reports she always has a chronic degree of this. DOE and orthopnea is about the same. No fevers or chills.   Past Medical History  Diagnosis Date  . PAF (paroxysmal atrial fibrillation)     a. CHA2DS2VASc = 5-->poor OAC candidate 2/2 falls.  . Essential hypertension   . Bilateral leg edema   . Internal hemorrhoids   . GERD (gastroesophageal reflux disease)   . Hiatal hernia   . Dermatomyositis   . Osteoarthritis   . Hypothyroidism   . Sleep apnea   . History of pancreatitis   . Chronic diastolic heart failure   . Shingles   . Gall stones     a. 08/2014 Abd U/S:  Large gallstone measuring 4.2 cm.  . Gout   . Deviated septum   . Hyperlipidemia   . Morbid obesity   . Continuous urine leakage   . CAD (coronary artery disease)     a. LHC 03/2006: EF 65%, mid LAD 40-50%, ostial D1 70-80%, normal circumflex, normal RCA;  b. Lexiscan Myoview 09/2010: No ischemia or scar, EF 75%;  c. Lex MV 5/14:  Normal, no ischemia, EF 71%  . Chronic diastolic CHF (congestive heart failure)  a. Echo 8/14:  Mild LVH, EF 55-65%, normal wall motion, Tr AI, mild MR, mod TR, PASP 45  . History of PFTs     a. PFTs 10/2012: normal.   . Cellulitis of right leg     Past Surgical History  Procedure Laterality Date  . Total abdominal hysterectomy    . Nasal septum surgery      Current Outpatient Prescriptions  Medication Sig Dispense Refill  . acetaminophen (TYLENOL) 500 MG tablet Take 1,000 mg by mouth every 6 (six) hours as needed for mild pain.    Marland Kitchen aspirin EC 325 MG tablet Take 325 mg by mouth daily.    . cephALEXin (KEFLEX) 250 MG capsule Take 250 mg by mouth 4 (four) times daily.    . Cholecalciferol (VITAMIN D3) 2000 UNITS TABS Take 2,000 Units by mouth daily.     .  clobetasol cream (TEMOVATE) 4.12 % Apply 1 application topically 2 (two) times daily.   1  . fluocinonide (LIDEX) 0.05 % external solution Apply 1 application topically 2 (two) times daily.  2  . folic acid (FOLVITE) 1 MG tablet Take 1 mg by mouth daily.  3  . furosemide (LASIX) 80 MG tablet Take 1 tablet (80 mg total) by mouth 2 (two) times daily. 60 tablet 11  . hydrocortisone 2.5 % cream Apply 1 application topically 2 (two) times daily.   2  . hydroxychloroquine (PLAQUENIL) 200 MG tablet Take 200 mg by mouth 2 (two) times daily.     Marland Kitchen levothyroxine (SYNTHROID, LEVOTHROID) 137 MCG tablet Take 1 tablet by mouth daily before breakfast.  5  . loperamide (IMODIUM) 2 MG capsule Take 2 mg by mouth as needed for diarrhea or loose stools.    . methotrexate (RHEUMATREX) 2.5 MG tablet Take 5 tablets by mouth once a week.  3  . metoprolol succinate (TOPROL-XL) 25 MG 24 hr tablet Take 1 tablet by mouth  daily 90 tablet 1  . Multiple Vitamin (MULTIVITAMIN) tablet Take 1 tablet by mouth daily.      . nitroGLYCERIN (NITROSTAT) 0.4 MG SL tablet Place 1 tablet (0.4 mg total) under the tongue every 5 (five) minutes as needed. (Patient taking differently: Place 0.4 mg under the tongue every 5 (five) minutes as needed for chest pain. ) 25 tablet 12  . nystatin cream (MYCOSTATIN) Apply 1 application topically every evening.     Marland Kitchen OVER THE COUNTER MEDICATION Place 1 drop into both eyes daily as needed (for dry eyes. Advanced eye relief).    . pantoprazole (PROTONIX) 40 MG tablet Take 40 mg by mouth daily.    . potassium chloride SA (KLOR-CON M20) 20 MEQ tablet Take 2 tablets (40 mEq total) by mouth 2 (two) times daily. 60 tablet 11  . predniSONE (DELTASONE) 5 MG tablet Take 5 mg by mouth daily with breakfast.     No current facility-administered medications for this visit.    Allergies:   Butoconazole and Lipitor   Social History:  The patient  reports that she has never smoked. She has never used smokeless  tobacco. She reports that she does not drink alcohol or use illicit drugs.   Family History:  The patient's family history includes Colon cancer in her paternal uncle; Coronary artery disease in her father; Heart attack in her brother; Kidney disease in her brother. There is no history of Stroke.  ROS:  Please see the history of present illness.  All other systems are reviewed and otherwise negative.  PHYSICAL EXAM:  VS:  BP 120/61 mmHg  Pulse 72  Resp 20  Ht 5' (1.524 m)  Wt 262 lb (118.842 kg)  BMI 51.17 kg/m2 BMI: Body mass index is 51.17 kg/(m^2). Well nourished, well developed obese WF, in no acute distress HEENT: normocephalic, atraumatic Neck: moderate JVD, no carotid bruits or masses Cardiac:  normal S1, S2; RRR with 2/6 SEM, no rubs, or gallops Lungs:  clear to auscultation bilaterally, no wheezing, rhonchi or rales Abd: soft, nontender, no hepatomegaly, + BS MS: no deformity or atrophy Ext: diffuse edema bilaterally with mild RLE erythema; chronic venous stasis changes, no open weeping - edema appears to go up to lower thighs although it is difficult to tell with baseline large body habitus Skin: warm and dry, no rash Neuro:  moves all extremities spontaneously, no focal abnormalities noted, follows commands Psych: euthymic mood, full affect  EKG:  N/A  Recent Labs: 02/18/2014: Pro B Natriuretic peptide (BNP) 143.6* 02/20/2014: TSH 8.840* 08/21/2014: ALT 62*; BUN 30*; Creatinine, Ser 1.22*; Hemoglobin 13.3; Platelets 91*; Potassium 3.7; Sodium 147*  No results found for requested labs within last 365 days.   CrCl cannot be calculated (Patient has no serum creatinine result on file.).   Wt Readings from Last 3 Encounters:  12/19/14 262 lb (118.842 kg)  12/12/14 270 lb 12.8 oz (122.834 kg)  09/29/14 244 lb (110.678 kg)     Other studies reviewed: Additional studies/records reviewed today include: summarized above  ASSESSMENT AND PLAN:  1. Acute on chronic diastolic  CHF - 2D echo pending. BMET pending today. She has diuresed 6lbs so far, diuretics interrupted by diarrhea/vomiting for 2 days. She says her diuresis has picked up over the last day and she is steadily starting to diurese. She still has about 20lbs to go. She wishes to avoid hospitalization thus will continue outpatient diuretic with current Lasix and potassium dose. Warning signs reviewed - if weight begins to go up or diuresis slows down, she will need to come to the ER. Otherwise we will see back in a week to monitor continued progress so long as BMET is not prohibitive. I also asked her to elevate legs when possible. 2. Lower extremity cellulitis, on Keflex, complicated by diarrhea - diarrhea has resolved as of today. If it comes back, she will need to be in touch with her PCP about testing for C-diff. 3. Paroxysmal atrial fibrillation - She is not an anticoagulation candidate secondary to limited mobility and history of falls. She remains on beta blocker therapy. She sounds to be in NSR today. 4. Morbid obesity - agree that some of her weight may be body weight gain given poor diet but volume overload is playing a significant role. Her family says her husband is going to try cooking more. We also discussed that restaurants and fast foods often do have low salt, low fat items available if you review the menu or ask the staff. Eating out is not completely prohibitive of healthy choices. 5. CAD (nonobstructive) - no recent chest pain. Continue aspirin.  Disposition: F/u with flex APP 7-10 days to monitor continued progress. Family asked me if we can write a letter to her insurance company asking for a remodel of their bathroom to accommodate her in the shower. I am not sure how I can best help with this because all insurance companies are different. I asked her family to talk to the insurance company to find out what they would need from [place].  Current medicines are  reviewed at length with the patient today.   The patient did not have any concerns regarding medicines.  Signed, Melina Copa PA-C 12/19/2014 3:17 PM     [address] [place] [address] [phone] (office)  [phone] (fax)

## 2014-12-19 NOTE — Patient Instructions (Signed)
Medication Instructions:  Your physician recommends that you continue on your current medications as directed. Please refer to the Current Medication list given to you today.  Labwork: NONE  Testing/Procedures: NONE  Follow-Up: Your physician recommends that you follow up with Ignacia Bayley NP on July 22nd at 1:30 PM   Any Other Special Instructions Will Be Listed Below (If Applicable).

## 2015-01-02 ENCOUNTER — Encounter: Payer: Self-pay | Admitting: Nurse Practitioner

## 2015-01-02 ENCOUNTER — Ambulatory Visit (INDEPENDENT_AMBULATORY_CARE_PROVIDER_SITE_OTHER): Payer: Medicare Other | Admitting: Nurse Practitioner

## 2015-01-02 ENCOUNTER — Telehealth: Payer: Self-pay

## 2015-01-02 VITALS — BP 100/60 | HR 75 | Ht 60.0 in | Wt 244.0 lb

## 2015-01-02 DIAGNOSIS — I480 Paroxysmal atrial fibrillation: Secondary | ICD-10-CM

## 2015-01-02 DIAGNOSIS — I10 Essential (primary) hypertension: Secondary | ICD-10-CM | POA: Diagnosis not present

## 2015-01-02 DIAGNOSIS — E876 Hypokalemia: Secondary | ICD-10-CM

## 2015-01-02 DIAGNOSIS — I5043 Acute on chronic combined systolic (congestive) and diastolic (congestive) heart failure: Secondary | ICD-10-CM

## 2015-01-02 DIAGNOSIS — I5032 Chronic diastolic (congestive) heart failure: Secondary | ICD-10-CM

## 2015-01-02 LAB — BASIC METABOLIC PANEL
BUN: 14 mg/dL (ref 6–23)
CO2: 27 mEq/L (ref 19–32)
Calcium: 8.5 mg/dL (ref 8.4–10.5)
Chloride: 106 mEq/L (ref 96–112)
Creatinine, Ser: 1 mg/dL (ref 0.40–1.20)
GFR: 58.52 mL/min — ABNORMAL LOW (ref >60.00)
Glucose, Bld: 108 mg/dL — ABNORMAL HIGH (ref 70–99)
Potassium: 3.4 mEq/L — ABNORMAL LOW (ref 3.5–5.1)
Sodium: 141 mEq/L (ref 135–145)

## 2015-01-02 MED ORDER — POTASSIUM CHLORIDE CRYS ER 20 MEQ PO TBCR: 40.0000 meq | EXTENDED_RELEASE_TABLET | Freq: Three times a day (TID) | ORAL | Status: DC

## 2015-01-02 MED ORDER — FUROSEMIDE 40 MG PO TABS: 80.0000 mg | ORAL_TABLET | Freq: Two times a day (BID) | ORAL | Status: DC

## 2015-01-02 NOTE — Patient Instructions (Signed)
Medication Instructions:  Your physician recommends that you continue on your current medications as directed. Please refer to the Current Medication list given to you today.   Labwork: Bmet Today   Testing/Procedures: None   Follow-Up: Your physician recommends that you schedule a follow-up appointment in: 1 month with a Lori G.,NP or Scott W.,PA   Any Other Special Instructions Will Be Listed Below (If Applicable).

## 2015-01-02 NOTE — Telephone Encounter (Signed)
Called patient about lab results. Per Ignacia Bayley NP, BUN/Creat stable. K is low. She should take kdur 40 TID (currently taking bid). F/u bmet in 1 wk (she may be reluctant as she's a tough stick). Patient agreed to [facility] on next Friday.

## 2015-01-02 NOTE — Progress Notes (Signed)
Patient Name: [name] Date of Encounter: 01/02/2015  Primary Care Provider:  Tommy Medal, MD Primary Cardiologist:  J. Allred, MD   Chief Complaint  69 year old female with a history of diastolic heart failure, paroxysmal atrial fibrillation, chronic leukemia edema who presents for follow-up related to significant edema first noted July 1.  Past Medical History   Past Medical History  Diagnosis Date  . PAF (paroxysmal atrial fibrillation)     a. CHA2DS2VASc = 5-->poor OAC candidate 2/2 falls.  . Essential hypertension   . Bilateral leg edema   . Internal hemorrhoids   . GERD (gastroesophageal reflux disease)   . Hiatal hernia   . Dermatomyositis   . Osteoarthritis   . Hypothyroidism   . Sleep apnea   . History of pancreatitis   . Chronic diastolic heart failure   . Shingles   . Gall stones     a. 08/2014 Abd U/S:  Large gallstone measuring 4.2 cm.  . Gout   . Deviated septum   . Hyperlipidemia   . Morbid obesity   . Continuous urine leakage   . CAD (coronary artery disease)     a. LHC 03/2006: EF 65%, mid LAD 40-50%, ostial D1 70-80%, normal circumflex, normal RCA;  b. Lexiscan Myoview 09/2010: No ischemia or scar, EF 75%;  c. Lex MV 5/14:  Normal, no ischemia, EF 71%  . Chronic diastolic CHF (congestive heart failure)     a. Echo 8/14:  Mild LVH, EF 55-65%, normal wall motion, Tr AI, mild MR, mod TR, PASP 45  . History of PFTs     a. PFTs 10/2012: normal.   . Cellulitis of right leg    Past Surgical History  Procedure Laterality Date  . Total abdominal hysterectomy    . Nasal septum surgery      Allergies  Allergies  Allergen Reactions  . Butoconazole Itching, Rash and Other (See Comments)    'Redness and swelling feverish ' 'worse symptoms '  . Lipitor [Atorvastatin Calcium] Other (See Comments)    'Muscle weakness'      HPI  69 year old female with the above, problem list. She is morbidly obese with chronic lower extremity edema and also has a  history of paroxysmal atrial fibrillation, but is not felt to be a candidate for lytic regulation secondary to history of falls. I last saw her in clinic on July 1 at which time, she was markedly volume overloaded with evidence of anasarca. I increased her Lasix to 80 mg twice a day and subsequently prescribed potassium supplementation. She was seen in clinic on July 8 and had lost some weight but was still volume overloaded. She remained on Lasix 80 mg twice a day and has since lost 27 pounds. With that, she has had significant improvement in edema, though she continues to have chronic ankle and foot edema. She no longer has edema in her thighs or flanks. She can now get herself out of bed, which she was not previously able to do. She has been working with therapy to improve her muscle strength. She has previously tried to use compression stockings but has not tolerated them. She does try and keep her legs elevated during the day. She continues to have mild dyspnea on exertion in the setting of some amount of volume overload along with deconditioning. She also continues to sleep on 4 pillows at night. She denies PND, dizziness, syncope, early satiety, or chest pain.  Home Medications  Prior to Admission medications  Medication Sig Start Date End Date Taking? Authorizing Provider  acetaminophen (TYLENOL) 500 MG tablet Take 1,000 mg by mouth every 6 (six) hours as needed for mild pain.   Yes Historical Provider, MD  aspirin EC 325 MG tablet Take 325 mg by mouth daily.   Yes Historical Provider, MD  Cholecalciferol (VITAMIN D3) 2000 UNITS TABS Take 2,000 Units by mouth daily.    Yes Historical Provider, MD  clobetasol cream (TEMOVATE) 3.32 % Apply 1 application topically 2 (two) times daily.  06/24/14  Yes Historical Provider, MD  fluocinonide (LIDEX) 0.05 % external solution Apply 1 application topically 2 (two) times daily. 11/03/14  Yes Historical Provider, MD  folic acid (FOLVITE) 1 MG tablet Take 1 mg  by mouth daily. 08/27/14  Yes Historical Provider, MD  furosemide (LASIX) 40 MG tablet Take 2 tablets (80 mg total) by mouth 2 (two) times daily. 01/02/15  Yes Rogelia Mire, NP  hydrocortisone 2.5 % cream Apply 1 application topically 2 (two) times daily.  06/24/14  Yes Historical Provider, MD  hydroxychloroquine (PLAQUENIL) 200 MG tablet Take 200 mg by mouth 2 (two) times daily.  08/06/10  Yes Historical Provider, MD  levothyroxine (SYNTHROID, LEVOTHROID) 137 MCG tablet Take 1 tablet by mouth daily before breakfast. 12/01/14  Yes Historical Provider, MD  loperamide (IMODIUM) 2 MG capsule Take 2 mg by mouth as needed for diarrhea or loose stools.   Yes Historical Provider, MD  methotrexate (RHEUMATREX) 2.5 MG tablet Take 5 tablets by mouth once a week. 09/17/14  Yes Historical Provider, MD  metoprolol succinate (TOPROL-XL) 25 MG 24 hr tablet Take 1 tablet by mouth  daily 11/04/14  Yes [name], MD  Multiple Vitamin (MULTIVITAMIN) tablet Take 1 tablet by mouth daily.     Yes Historical Provider, MD  nitroGLYCERIN (NITROSTAT) 0.4 MG SL tablet Place 1 tablet (0.4 mg total) under the tongue every 5 (five) minutes as needed. Patient taking differently: Place 0.4 mg under the tongue every 5 (five) minutes as needed for chest pain.  08/26/13  Yes Liliane Shi, PA-C  nystatin cream (MYCOSTATIN) Apply 1 application topically every evening.    Yes Historical Provider, MD  OVER THE COUNTER MEDICATION Place 1 drop into both eyes daily as needed (for dry eyes. Advanced eye relief).   Yes Historical Provider, MD  pantoprazole (PROTONIX) 40 MG tablet Take 40 mg by mouth daily. 08/09/10  Yes Historical Provider, MD  potassium chloride SA (KLOR-CON M20) 20 MEQ tablet Take 2 tablets (40 mEq total) by mouth 2 (two) times daily. 12/12/14  Yes Rogelia Mire, NP  predniSONE (DELTASONE) 5 MG tablet Take 5 mg by mouth daily with breakfast.   Yes Historical Provider, MD    Review of Systems  As above, she does  continue to have some lower extremity edema, dyspnea on exertion, and 4 pillow orthopnea which appears to be stable.  All other systems reviewed and are otherwise negative except as noted above.  Physical Exam  VS:  BP 100/60 mmHg  Pulse 75  Ht 5' (1.524 m)  Wt 244 lb (110.678 kg)  BMI 47.65 kg/m2 , BMI Body mass index is 47.65 kg/(m^2). GEN: Well nourished, well developed, in no acute distress. HEENT: normal. Neck: Supple, JVP approximately 10 cm, no carotid bruits, or masses. Cardiac: RRR, 2/6 systolic murmur heard throughout, no rubs, or gallops. No clubbing, cyanosis. 2+ bilateral lower extremity edema to the mid calf. This is markedly improved. Radials/DP/PT 1 + and equal bilaterally.  Respiratory:  Respirations regular and unlabored, clear to auscultation bilaterally. GI: Obese, soft, nontender, nondistended, BS + x 4. She no longer has flank edema. MS: no deformity or atrophy. Skin: warm and dry, no rash. Neuro:  Strength and sensation are intact. Psych: Normal affect.  Accessory Clinical Findings  ECG - regular sinus rhythm, PACs, 75, nonspecific T changes. No acute changes.  Assessment & Plan  1.  Acute on chronic diastolic congestive heart failure: Patient presented on July 1 significant volume overload, lower chimney edema, anasarca, and lower extremity cellulitis. She has markedly improved with 27 pound weight loss and improved functional status.  She has more closely monitoring her sodium intake and has cut back on processed and fast foods. Echo earlier this month showed normal LV function. She continues to have some amount of dyspnea on exertion and orthopnea. She also has chronic lower extremity edema which persists. I will check labs today and plan to continue her Lasix at 80 mg twice a day for the time being. Her heart rate and blood pressure are now well controlled. She will be receiving therapy at home which will be important in her recovery. She has also been advised to  keep her legs elevated when sitting. We will arrange for follow-up in one month.  2. Essential hypertension: Stable.  3. Paroxysmal atrial fibrillation: In sinus rhythm. No recent complex of palpitations. She is not felt to be a good candidate for anticoagulation secondary to prior history of falls.  4. Morbid obesity: This remains an issue and her activity is limited though she is now receiving therapy. She is now paying closer attention to her diet and also sodium intake. Hopefully with weight loss and improve mobility, we can look toward an exercise program.  5. Disposition: Check basic met about profile today and follow-up in one month or sooner if necessary.   Murray Hodgkins, NP 01/02/2015, 2:19 PM

## 2015-01-02 NOTE — Progress Notes (Signed)
   Patient ID: [name], female    DOB: 21-Oct-1945, 69 y.o.   MRN: [id number]  HPI    Review of Systems    Physical Exam

## 2015-01-09 ENCOUNTER — Telehealth: Payer: Self-pay

## 2015-01-09 ENCOUNTER — Other Ambulatory Visit (INDEPENDENT_AMBULATORY_CARE_PROVIDER_SITE_OTHER): Payer: Medicare Other | Admitting: *Deleted

## 2015-01-09 ENCOUNTER — Other Ambulatory Visit: Payer: Self-pay | Admitting: *Deleted

## 2015-01-09 DIAGNOSIS — E876 Hypokalemia: Secondary | ICD-10-CM

## 2015-01-09 LAB — BASIC METABOLIC PANEL
BUN: 15 mg/dL (ref 6–23)
CO2: 30 mEq/L (ref 19–32)
Calcium: 8.7 mg/dL (ref 8.4–10.5)
Chloride: 106 mEq/L (ref 96–112)
Creatinine, Ser: 1.09 mg/dL (ref 0.40–1.20)
GFR: 52.98 mL/min — ABNORMAL LOW (ref >60.00)
Glucose, Bld: 96 mg/dL (ref 70–99)
Potassium: 3.4 mEq/L — ABNORMAL LOW (ref 3.5–5.1)
Sodium: 145 mEq/L (ref 135–145)

## 2015-01-09 NOTE — Telephone Encounter (Signed)
Called patient about lab results. Per Gar Gibbon NP, Renal fxn stable - cont current lasix dose. K remains low. pls make sure that she's taking kcl 40 tid. She can have f/u labs when she's seen back in clinic, as I know that she is reluctant to have frequent labs. Patient stated she has only been taking KCl 40 meq twice daily, informed patient that she was suppose to be taking KCl 40 meq three time a day. Informed patient that she would need lab work when she returns at the end of August. Patient verbalized understanding.

## 2015-01-09 NOTE — Addendum Note (Signed)
Addended by: Eulis Foster on: 01/09/2015 09:37 AM   Modules accepted: Orders

## 2015-02-10 NOTE — Progress Notes (Signed)
Cardiology Office Note   Date:  02/11/2015   ID:  [name], DOB 11/02/1945, MRN [id number]  Patient Care Team: [name], MD as PCP - General (Internal Medicine) [name], MD as Consulting Physician (Clinical Cardiac Electrophysiology) Leigh Aurora, MD as Consulting Physician (Rheumatology)    Chief Complaint  Patient presents with  . Congestive Heart Failure  . Atrial Fibrillation     History of Present Illness: JOELEE [name] is a 69 y.o. female with a hx of hx of atrial fibrillation, CAD, diastolic HF, HTN, steroid-induced diabetes, dermatomyositis, HL, hypothyroidism, sleep apnea, obesity. LHC in 03/2006 demonstrated non-obstructive CAD. There was no change compared to 2005 and medical therapy continued. Patient saw Dr. Verl Blalock in 08/2012 wiith recurrent atrial fibrillation. She had been controlled on sotalol for many years. Sotalol was stopped and she was placed on amiodarone. She had some side effects to the Amiodarone (tremor) and this was ultimately d/c'd. Lexiscan Myoview in 10/2012 was normal.  Seen by Dr. Rayann Heman 11/2013. She remained in sinus rhythm. He recommended that if atrial fibrillation recurs, he would restart sotalol. Tikosyn was also an option. She suffered a T12 compression fracture in September 2015 and underwent kyphoplasty. She subsequently underwent T11 kyphoplasty in 05/2014.  She is not on anticoag 2/2 significant fall risk.    Last seen several times in July with volume excess. Lasix dose had been adjusted. When seen 7/22, she was down 27 pounds and demonstrated improved functional status. She returns for follow-up. Here with her husband.  Unfortunately, her mother passed away recently.  The funeral was yesterday.  She has not been able to take Lasix bid for the last few days and she has noted a change in her diet.  In light of this, she is up 5 lbs. LE edema is a little worse.  No orthopnea, PND. She continues to note improved functional status.  She does  have some chest pressure with activities at times. She gets some nausea with this.  No radiating symptoms. She has noted this for the past 6 mos.  She notes improvement in CP with diuresis.  No syncope.     Studies/Reports Reviewed Today:  Echo 7/16 EF 60-65%, normal wall motion, mild BAE, PASP 42 mmHg (moderately increased)  Myoview 5/14 Normal stress nuclear study.  LV Ejection Fraction: 71%.  LHC 03/2006:  EF 65%, mid LAD 40-50%, ostial D1 70-80%, normal circumflex, normal RCA   Past Medical History  Diagnosis Date  . PAF (paroxysmal atrial fibrillation)     a. CHA2DS2VASc = 5-->poor OAC candidate 2/2 falls.  . Essential hypertension   . Bilateral leg edema   . Internal hemorrhoids   . GERD (gastroesophageal reflux disease)   . Hiatal hernia   . Dermatomyositis   . Osteoarthritis   . Hypothyroidism   . Sleep apnea   . History of pancreatitis   . Chronic diastolic heart failure   . Shingles   . Gall stones     a. 08/2014 Abd U/S:  Large gallstone measuring 4.2 cm.  . Gout   . Deviated septum   . Hyperlipidemia   . Morbid obesity   . Continuous urine leakage   . CAD (coronary artery disease)     a. LHC 03/2006: EF 65%, mid LAD 40-50%, ostial D1 70-80%, normal circumflex, normal RCA;  b. Lexiscan Myoview 09/2010: No ischemia or scar, EF 75%;  c. Lex MV 5/14:  Normal, no ischemia, EF 71%  . Chronic diastolic CHF (congestive  heart failure)     a. Echo 8/14:  Mild LVH, EF 55-65%, normal wall motion, Tr AI, mild MR, mod TR, PASP 45;  b. 12/2014 Echo: EF 60-65%, nl wall motion, mildly dil RA/LA, PASP 67mmHg.  Marland Kitchen History of PFTs     a. PFTs 10/2012: normal.   . Cellulitis of right leg     Past Surgical History  Procedure Laterality Date  . Total abdominal hysterectomy    . Nasal septum surgery       Current Outpatient Prescriptions  Medication Sig Dispense Refill  . acetaminophen (TYLENOL) 500 MG tablet Take 1,000 mg by mouth every 6 (six) hours as needed for mild pain.     Marland Kitchen aspirin EC 325 MG tablet Take 325 mg by mouth daily.    . Cholecalciferol (VITAMIN D3) 2000 UNITS TABS Take 2,000 Units by mouth daily.     . clobetasol cream (TEMOVATE) 1.61 % Apply 1 application topically 2 (two) times daily.   1  . fluocinonide (LIDEX) 0.05 % external solution Apply 1 application topically 2 (two) times daily.  2  . FOLIC ACID PO Take 1 tablet by mouth daily.    . furosemide (LASIX) 40 MG tablet Take 2 tablets (80 mg total) by mouth 2 (two) times daily. 120 tablet 5  . hydrocortisone 2.5 % cream Apply 1 application topically 2 (two) times daily.   2  . hydroxychloroquine (PLAQUENIL) 200 MG tablet Take 200 mg by mouth 2 (two) times daily.     Marland Kitchen levothyroxine (SYNTHROID, LEVOTHROID) 137 MCG tablet Take 1 tablet by mouth daily before breakfast.  5  . loperamide (IMODIUM) 2 MG capsule Take 2 mg by mouth as needed for diarrhea or loose stools.    . methotrexate (RHEUMATREX) 2.5 MG tablet Take 5 tablets by mouth once a week.  3  . metoprolol succinate (TOPROL-XL) 25 MG 24 hr tablet Take 1 tablet (25 mg total) by mouth 2 (two) times daily. 60 tablet 11  . Multiple Vitamin (MULTIVITAMIN) tablet Take 1 tablet by mouth daily.      . nitroGLYCERIN (NITROSTAT) 0.4 MG SL tablet Place 1 tablet (0.4 mg total) under the tongue every 5 (five) minutes as needed. (Patient taking differently: Place 0.4 mg under the tongue every 5 (five) minutes as needed for chest pain. ) 25 tablet 12  . nystatin cream (MYCOSTATIN) Apply 1 application topically every evening.     Marland Kitchen OVER THE COUNTER MEDICATION Place 1 drop into both eyes daily as needed (for dry eyes. Advanced eye relief).    . pantoprazole (PROTONIX) 40 MG tablet Take 40 mg by mouth daily.    . potassium chloride SA (KLOR-CON M20) 20 MEQ tablet Take 2 tablets (40 mEq total) by mouth 3 (three) times daily. 60 tablet 11  . predniSONE (DELTASONE) 5 MG tablet Take 5 mg by mouth daily with breakfast.     No current facility-administered  medications for this visit.    Allergies:   Butoconazole and Lipitor    Social History:  The patient  reports that she has never smoked. She has never used smokeless tobacco. She reports that she does not drink alcohol or use illicit drugs.   Family History:  The patient's family history includes Colon cancer in her paternal uncle; Coronary artery disease in her father; Heart attack in her brother; Hypertension in her brother; Kidney disease in her brother. There is no history of Stroke.    ROS:   Please see the history of  present illness.   Review of Systems  All other systems reviewed and are negative.     PHYSICAL EXAM: VS:  BP 100/60 mmHg  Pulse 79  Ht 5' (1.524 m)  Wt 231 lb 12.8 oz (105.144 kg)  BMI 45.27 kg/m2    Wt Readings from Last 3 Encounters:  02/11/15 231 lb 12.8 oz (105.144 kg)  01/02/15 244 lb (110.678 kg)  12/19/14 262 lb (118.842 kg)     GEN: Well nourished, well developed, in no acute distress HEENT: normal Neck:  I cannot assess JVD, no masses Cardiac:  Normal S1/S2, RRR; no murmur ,  no rubs or gallops, 1+ bilateral LE edema   Respiratory:  clear to auscultation bilaterally, no wheezing, rhonchi or rales. GI: soft, nontender, nondistended, + BS MS: no deformity or atrophy Skin: warm and dry  Neuro:  CNs II-XII intact, Strength and sensation are intact Psych: Normal affect   EKG:  EKG is ordered today.  It demonstrates:   NSR, HR 79, low voltage, PACs, QTc 454 ms, nonspecific ST-T wave changes, no change from prior tracings   Recent Labs: 02/18/2014: Pro B Natriuretic peptide (BNP) 143.6* 02/20/2014: TSH 8.840* 08/21/2014: ALT 62*; Hemoglobin 13.3; Platelets 91* 01/09/2015: BUN 15; Creatinine, Ser 1.09; Potassium 3.4*; Sodium 145    Lipid Panel No results found for: CHOL, TRIG, HDL, CHOLHDL, VLDL, LDLCALC, LDLDIRECT    ASSESSMENT AND PLAN:  Chest Pain:   Symptoms with typical and atypical features.  She does have a hx of non-obs CAD by [facility] in  2007.  Last nuc study in 2014 was low risk.  She would not be good candidate for cath as she is high risk for anticoagulation.  CP is overall stable.  Symptoms may be related to HF as there was some improvement with diuresis.  ECG is unchanged.  BP is soft and would not allow much adjustment. I will have her increase Toprol-XL to 25 mg bid. I suspect she can tolerate this. She will resume her usual diuretic dose. I will see her back in 1 month.  If symptoms progress or persist, will consider arranging a Myoview.  Consider adding Ranexa for anti-anginal Rx.   Chronic Diastolic CHF:  Mild volume excess given change in diet and poor adherence to Lasix recently while traveling to [place] for her mother's funeral.  I have asked her to resume her usual dose of diuretics.  FU BMET today.   Paroxysmal Atrial Fibrillation:  Maintaining NSR. Not a candidate for anticoag due to high risk of falls.   Hypertension:  Controlled.   Morbid Obesity:  She is working on her diet.    Coronary Artery Disease:  As above.  Will adjust beta-blocker.  Continue ASA.  She is intol of statins.    Hyperlipidemia:  Intol of statins.     Medication Changes: Current medicines are reviewed at length with the patient today.  Concerns regarding medicines are as outlined above.  The following changes have been made:   Discontinued Medications   FOLIC ACID (FOLVITE) 1 MG TABLET    Take 1 mcg by mouth daily.    Modified Medications   Modified Medication Previous Medication   METOPROLOL SUCCINATE (TOPROL-XL) 25 MG 24 HR TABLET metoprolol succinate (TOPROL-XL) 25 MG 24 hr tablet      Take 1 tablet (25 mg total) by mouth 2 (two) times daily.    Take 1 tablet by mouth  daily   New Prescriptions   No medications on file  Labs/ tests ordered today include:   Orders Placed This Encounter  Procedures  . Basic Metabolic Panel (BMET)  . EKG 12-Lead      Disposition:    FU with me 1 month.    Signed, Versie Starks, MHS 02/11/2015 11:52 AM    [facility] [place], [place], [place]  [address] Phone: [phone]; Fax: [phone]

## 2015-02-11 ENCOUNTER — Encounter: Payer: Self-pay | Admitting: Physician Assistant

## 2015-02-11 ENCOUNTER — Ambulatory Visit (INDEPENDENT_AMBULATORY_CARE_PROVIDER_SITE_OTHER): Payer: Medicare Other | Admitting: Physician Assistant

## 2015-02-11 VITALS — BP 100/60 | HR 79 | Ht 60.0 in | Wt 231.8 lb

## 2015-02-11 DIAGNOSIS — I5032 Chronic diastolic (congestive) heart failure: Secondary | ICD-10-CM

## 2015-02-11 DIAGNOSIS — I2510 Atherosclerotic heart disease of native coronary artery without angina pectoris: Secondary | ICD-10-CM | POA: Diagnosis not present

## 2015-02-11 DIAGNOSIS — I480 Paroxysmal atrial fibrillation: Secondary | ICD-10-CM | POA: Diagnosis not present

## 2015-02-11 DIAGNOSIS — E785 Hyperlipidemia, unspecified: Secondary | ICD-10-CM

## 2015-02-11 DIAGNOSIS — I10 Essential (primary) hypertension: Secondary | ICD-10-CM

## 2015-02-11 DIAGNOSIS — R072 Precordial pain: Secondary | ICD-10-CM

## 2015-02-11 LAB — BASIC METABOLIC PANEL
BUN: 14 mg/dL (ref 6–23)
CO2: 25 mEq/L (ref 19–32)
Calcium: 8.5 mg/dL (ref 8.4–10.5)
Chloride: 110 mEq/L (ref 96–112)
Creatinine, Ser: 1.08 mg/dL (ref 0.40–1.20)
GFR: 53.53 mL/min — ABNORMAL LOW (ref >60.00)
Glucose, Bld: 102 mg/dL — ABNORMAL HIGH (ref 70–99)
Potassium: 3.9 mEq/L (ref 3.5–5.1)
Sodium: 142 mEq/L (ref 135–145)

## 2015-02-11 MED ORDER — METOPROLOL SUCCINATE ER 25 MG PO TB24: 25.0000 mg | ORAL_TABLET | Freq: Two times a day (BID) | ORAL | Status: DC

## 2015-02-11 NOTE — Patient Instructions (Signed)
Medication Instructions:  Your physician has recommended you make the following change in your medication:  1- Increase Toprol XL 25 mg by mouth twice daily (every 12 hours)   Labwork: Your physician recommends that you have lab work today- BMET.   Testing/Procedures: NONE  Follow-Up: Your physician recommends that you schedule a follow-up appointment in: 1 month with Richardson Dopp PA.   Any Other Special Instructions Will Be Listed Below (If Applicable).

## 2015-03-03 ENCOUNTER — Encounter: Payer: Self-pay | Admitting: Internal Medicine

## 2015-03-12 NOTE — Progress Notes (Signed)
Cardiology Office Note   Date:  03/13/2015   ID:  [name], DOB November 29, 1945, MRN [id number]  Patient Care Team: [name], MD as PCP - General (Internal Medicine) [name], MD as Consulting Physician (Clinical Cardiac Electrophysiology) Hennie Duos, MD as Consulting Physician (Rheumatology)    Chief Complaint  Patient presents with  . Follow-up  . Chest Pain  . Congestive Heart Failure     History of Present Illness: [name] is a 69 y.o. female with a hx of hx of atrial fibrillation, CAD, diastolic HF, HTN, steroid-induced diabetes, dermatomyositis, HL, hypothyroidism, sleep apnea, obesity. LHC in 03/2006 demonstrated non-obstructive CAD. There was no change compared to 2005 and medical therapy continued. Patient saw Dr. Verl Blalock in 08/2012 wiith recurrent atrial fibrillation. She had been controlled on sotalol for many years. Sotalol was stopped and she was placed on amiodarone. She had some side effects to the Amiodarone (tremor) and this was ultimately d/c'd. Lexiscan Myoview in 10/2012 was normal.  Seen by Dr. Rayann Heman 11/2013. She remained in sinus rhythm. He recommended that if atrial fibrillation recurs, he would restart sotalol. Tikosyn was also an option. She suffered a T12 compression fracture in September 2015 and underwent kyphoplasty. She subsequently underwent T11 kyphoplasty in 05/2014.  She is not on anticoag 2/2 significant fall risk.    She was seen several times in July with volume excess.  When seen 7/22, she was down 27 pounds and demonstrated improved functional status. I saw her 19/16. She had been off of Lasix for a few days attending the funeral for her mother.  She complained of increasing shortness of breath, edema as well as chest pain. I adjusted her beta blocker. She was asked to resume her usual dose of Lasix. She returns for follow-up  Over the past week she has had a cough that is productive mainly of clear to white sputum. She's had a  low-grade temperature. Chronic dyspnea with exertion is fairly stable. She denies orthopnea, PND. LE edema is stable. She has some left-sided chest discomfort that is brought on by palpation or arm movement. This is in the mid axillary line lateral to her breast. She denies pleuritic chest discomfort. She denies substernal discomfort. She denies syncope. She has been weak and dizzy over the last couple of days.   Studies/Reports Reviewed Today:  Echo 7/16 EF 60-65%, normal wall motion, mild BAE, PASP 42 mmHg (moderately increased)  Myoview 5/14 Normal stress nuclear study.  LV Ejection Fraction: 71%.  LHC 03/2006:  EF 65%, mid LAD 40-50%, ostial D1 70-80%, normal circumflex, normal RCA   Past Medical History  Diagnosis Date  . PAF (paroxysmal atrial fibrillation)     a. CHA2DS2VASc = 5-->poor OAC candidate 2/2 falls.  . Essential hypertension   . Bilateral leg edema   . Internal hemorrhoids   . GERD (gastroesophageal reflux disease)   . Hiatal hernia   . Dermatomyositis   . Osteoarthritis   . Hypothyroidism   . Sleep apnea   . History of pancreatitis   . Chronic diastolic heart failure   . Shingles   . Gall stones     a. 08/2014 Abd U/S:  Large gallstone measuring 4.2 cm.  . Gout   . Deviated septum   . Hyperlipidemia   . Morbid obesity   . Continuous urine leakage   . CAD (coronary artery disease)     a. LHC 03/2006: EF 65%, mid LAD 40-50%, ostial D1 70-80%, normal circumflex, normal  RCA;  b. Lexiscan Myoview 09/2010: No ischemia or scar, EF 75%;  c. Lex MV 5/14:  Normal, no ischemia, EF 71%  . Chronic diastolic CHF (congestive heart failure)     a. Echo 8/14:  Mild LVH, EF 55-65%, normal wall motion, Tr AI, mild MR, mod TR, PASP 45;  b. 12/2014 Echo: EF 60-65%, nl wall motion, mildly dil RA/LA, PASP 11mmHg.  Marland Kitchen History of PFTs     a. PFTs 10/2012: normal.   . Cellulitis of right leg     Past Surgical History  Procedure Laterality Date  . Total abdominal hysterectomy      . Nasal septum surgery       Current Outpatient Prescriptions  Medication Sig Dispense Refill  . acetaminophen (TYLENOL) 500 MG tablet Take 1,000 mg by mouth every 6 (six) hours as needed for mild pain.    Marland Kitchen aspirin EC 325 MG tablet Take 325 mg by mouth daily.    . Cholecalciferol (VITAMIN D3) 2000 UNITS TABS Take 2,000 Units by mouth daily.     . clobetasol cream (TEMOVATE) 5.39 % Apply 1 application topically 2 (two) times daily.   1  . fluocinonide (LIDEX) 0.05 % external solution Apply 1 application topically 2 (two) times daily.  2  . FOLIC ACID PO Take 1 tablet by mouth daily.    . furosemide (LASIX) 40 MG tablet Take 2 tablets (80 mg total) by mouth 2 (two) times daily. 120 tablet 5  . hydrocortisone 2.5 % cream Apply 1 application topically 2 (two) times daily.   2  . hydroxychloroquine (PLAQUENIL) 200 MG tablet Take 200 mg by mouth 2 (two) times daily.     Marland Kitchen levothyroxine (SYNTHROID, LEVOTHROID) 137 MCG tablet Take 1 tablet by mouth daily before breakfast.  5  . loperamide (IMODIUM) 2 MG capsule Take 2 mg by mouth as needed for diarrhea or loose stools.    . methotrexate (RHEUMATREX) 2.5 MG tablet Take 5 tablets by mouth once a week.  3  . metoprolol succinate (TOPROL-XL) 25 MG 24 hr tablet Take 1 tablet (25 mg total) by mouth daily.    . Multiple Vitamin (MULTIVITAMIN) tablet Take 1 tablet by mouth daily.      . nitroGLYCERIN (NITROSTAT) 0.4 MG SL tablet Place 1 tablet (0.4 mg total) under the tongue every 5 (five) minutes as needed. (Patient taking differently: Place 0.4 mg under the tongue every 5 (five) minutes as needed for chest pain. ) 25 tablet 12  . nystatin cream (MYCOSTATIN) Apply 1 application topically every evening.     Marland Kitchen OVER THE COUNTER MEDICATION Place 1 drop into both eyes daily as needed (for dry eyes. Advanced eye relief).    . pantoprazole (PROTONIX) 40 MG tablet Take 40 mg by mouth daily.    . potassium chloride SA (KLOR-CON M20) 20 MEQ tablet Take 2 tablets  (40 mEq total) by mouth 3 (three) times daily. 60 tablet 11  . predniSONE (DELTASONE) 5 MG tablet Take 5 mg by mouth daily with breakfast.    . doxycycline (VIBRAMYCIN) 100 MG capsule Take 1 capsule (100 mg total) by mouth 2 (two) times daily. 14 capsule 0   No current facility-administered medications for this visit.    Allergies:   Butoconazole; Lipitor; and Keflex    Social History:  The patient  reports that she has never smoked. She has never used smokeless tobacco. She reports that she does not drink alcohol or use illicit drugs.   Family History:  The patient's family history includes Colon cancer in her paternal uncle; Coronary artery disease in her father; Heart attack in her brother; Hypertension in her brother; Kidney disease in her brother. There is no history of Stroke.    ROS:   Please see the history of present illness.   Review of Systems  Cardiovascular: Positive for chest pain.  All other systems reviewed and are negative.     PHYSICAL EXAM: VS:  BP 102/58 mmHg  Pulse 82  Ht 5' (1.524 m)  Wt 226 lb (102.513 kg)  BMI 44.14 kg/m2  SpO2 99%    Wt Readings from Last 3 Encounters:  03/13/15 226 lb (102.513 kg)  02/11/15 231 lb 12.8 oz (105.144 kg)  01/02/15 244 lb (110.678 kg)     GEN: Well nourished, well developed, in no acute distress HEENT: normal Neck:  I cannot assess JVD, no masses Cardiac:  Normal S1/S2, RRR; no murmur ,  no rubs or gallops, 1+ bilateral LE edema   Respiratory:  clear to auscultation bilaterally, no wheezing, rhonchi or rales. GI: soft, nontender, nondistended, + BS MS: no deformity or atrophy Skin: mod area of erythema and slight warmth L elbow  Neuro:  CNs II-XII intact, Strength and sensation are intact Psych: Normal affect   EKG:  EKG is ordered today.  It demonstrates:   NSR, HR 82, low voltage, QTc 521 ms, no change from prior tracings   Recent Labs: 08/21/2014: ALT 62*; Hemoglobin 13.3; Platelets 91* 02/11/2015: BUN 14;  Creatinine, Ser 1.08; Potassium 3.9; Sodium 142    Lipid Panel No results found for: CHOL, TRIG, HDL, CHOLHDL, VLDL, LDLCALC, LDLDIRECT    ASSESSMENT AND PLAN:  1. Cough:    Patient presents with cough 1 week with clear sputum production and low-grade fever. She is immunocompromised with her history of dermatomyositis and chronic use of plaquenil, methotrexate and prednisone.  She also has a rash on her left elbow. She has had cellulitis in the past.  -  Start doxycycline 100 mg twice a day  -  Obtain chest x-ray, CBC with differential and BMET area  -  Early follow-up with PCP next week.  -  Patient advised to go to the emergency room over the weekend if she feels worse.  -  Start amoxicillin 1 g 3 times a day 1 week if symptoms not improved in the next 3 days. (Rx given)  2. Rash:  ? Cellulitis.  Will give Doxy as noted.  If no improvement in her symptoms in 3 days, add Amox 1 gm TID.  Early FU with PCP next week.   3. Chronic Diastolic CHF:   Volume stable.  With her cough I am concerned she may be dehydrated some.  Decrease Lasix and K+ for 3 days.  BMET today.   4. Paroxysmal Atrial Fibrillation:  Maintaining NSR. Not a candidate for anticoag due to high risk of falls. She notes Alopecia.  Decrease Toprol to 25 mg QD.  Could change to Diltiazem and stop beta-blocker, but she has been stable on beta-blocker for a long time now.  Will consider this over time.   5. Hypertension:  Controlled.   6.Morbid Obesity:  She is working on her diet.    7. Coronary Artery Disease:  Non-obstructive CAD by cath in 2007; low risk Myoview 2014.  Continue beta-blocker, ASA.  She is intol of statins.  No chest pain concerning for angina. She has MSK type CP.  CXR is pending for her cough.  8. Hyperlipidemia:  Intol of statins.     Medication Changes: Current medicines are reviewed at length with the patient today.  Concerns regarding medicines are as outlined above.  The following changes have  been made:   Discontinued Medications   No medications on file   Modified Medications   Modified Medication Previous Medication   METOPROLOL SUCCINATE (TOPROL-XL) 25 MG 24 HR TABLET metoprolol succinate (TOPROL-XL) 25 MG 24 hr tablet      Take 1 tablet (25 mg total) by mouth daily.    Take 1 tablet (25 mg total) by mouth 2 (two) times daily.   New Prescriptions   DOXYCYCLINE (VIBRAMYCIN) 100 MG CAPSULE    Take 1 capsule (100 mg total) by mouth 2 (two) times daily.   Labs/ tests ordered today include:   Orders Placed This Encounter  Procedures  . DG Chest 2 View  . Basic Metabolic Panel (BMET)  . CBC w/Diff  . EKG 12-Lead     Disposition:    FU Dr. [name] or me 3 mos.      Signed, Versie Starks, MHS 03/13/2015 12:21 PM    [facility] [place], [place], [place]  [address] Phone: [phone]; Fax: [phone]

## 2015-03-13 ENCOUNTER — Encounter: Payer: Self-pay | Admitting: Physician Assistant

## 2015-03-13 ENCOUNTER — Ambulatory Visit
Admission: RE | Admit: 2015-03-13 | Discharge: 2015-03-13 | Disposition: A | Discharge status: Home / Self Care | Payer: Medicare Other | Source: Ambulatory Visit | Attending: Physician Assistant | Admitting: Physician Assistant

## 2015-03-13 ENCOUNTER — Ambulatory Visit (INDEPENDENT_AMBULATORY_CARE_PROVIDER_SITE_OTHER): Payer: Medicare Other | Admitting: Physician Assistant

## 2015-03-13 VITALS — BP 102/58 | HR 82 | Ht 60.0 in | Wt 226.0 lb

## 2015-03-13 DIAGNOSIS — R21 Rash and other nonspecific skin eruption: Secondary | ICD-10-CM | POA: Diagnosis not present

## 2015-03-13 DIAGNOSIS — R05 Cough: Secondary | ICD-10-CM

## 2015-03-13 DIAGNOSIS — R072 Precordial pain: Secondary | ICD-10-CM

## 2015-03-13 DIAGNOSIS — I2510 Atherosclerotic heart disease of native coronary artery without angina pectoris: Secondary | ICD-10-CM

## 2015-03-13 DIAGNOSIS — R059 Cough, unspecified: Secondary | ICD-10-CM

## 2015-03-13 DIAGNOSIS — I5032 Chronic diastolic (congestive) heart failure: Secondary | ICD-10-CM | POA: Diagnosis not present

## 2015-03-13 DIAGNOSIS — I480 Paroxysmal atrial fibrillation: Secondary | ICD-10-CM

## 2015-03-13 DIAGNOSIS — E785 Hyperlipidemia, unspecified: Secondary | ICD-10-CM

## 2015-03-13 DIAGNOSIS — I10 Essential (primary) hypertension: Secondary | ICD-10-CM

## 2015-03-13 MED ORDER — METOPROLOL SUCCINATE ER 25 MG PO TB24: 25.0000 mg | ORAL_TABLET | Freq: Every day | ORAL | Status: DC

## 2015-03-13 MED ORDER — DOXYCYCLINE HYCLATE 100 MG PO CAPS: 100.0000 mg | ORAL_CAPSULE | Freq: Two times a day (BID) | ORAL | Status: DC

## 2015-03-13 NOTE — Patient Instructions (Addendum)
Medication Instructions:  1. DECREASE TOPROL XL TO 25 MG ONCE A DAY  2. FOR 3 DAYS YOU ARE TO DECREASE LASIX TO 80 MG DAILY; THEN GO BACK TO LASIX 80 MG TWICE DAILY  3. FOR 3 DAYS YOU ARE TO DECREASE POTASSIUM TO 40 MEQ TWICE DAILY; THEN GO BACK TO POTASSIUM 40 MEQ THREE TIMES DAILY  4. START DOXYCYCLINE 100 MG TWICE DAILY FOR 7 DAYS  5. YOU HAVE BEEN GIVEN A PAPER RX FOR AMOXICILLIN 1000 MG THREE TIMES DAILY FOR 7 DAYS ; IF YOU ARE NOT FEELING BETTER ON Monday 10/3 THEN START THE AMOXICILLIN     Labwork: 1. TODAY BMET, CBC W/DIFF  Testing/Procedures: CHEST X-RAY TO BE DONE TODAY AT [address] ; AT [facility]   Follow-Up: 3 MONTHS WITH DR. ALLRED   Any Other Special Instructions Will Be Listed Below (If Applicable). IF YOU ARE FEELING  WORSE THE WEEKEND YOU HAVE BEEN ADVISED TO GO TO THE ED  YOU WILL NEED TO FOLLOW UP WITH YOUR PCP ON 03/17/15 @ 3:15; YOU WILL SEE MARY THE NP     ADDENDUM: [facility] OUR LAB TECH STATED NO ABLE TO OBTAIN PT'S LAB WORK TODAY; WE WILL SEND PT TO [facility] TODAY; ORDERS PLACED FOR [facility]

## 2015-03-13 NOTE — Addendum Note (Signed)
Addended by: Domenica Reamer R on: 03/13/2015 12:40 PM   Modules accepted: Orders

## 2015-03-13 NOTE — Addendum Note (Signed)
Addended by: Michae Kava on: 03/13/2015 12:41 PM   Modules accepted: Orders

## 2015-03-16 ENCOUNTER — Telehealth: Payer: Self-pay | Admitting: *Deleted

## 2015-03-16 NOTE — Telephone Encounter (Signed)
Pt notified of CXR results by phone. Pt tells me that we were unable to obtain her lab work Friday, and even [facility] could not get lab. Pt seeing PCP 10/4 and said she will ask for them to try and get lab work for our office. I stated that will be fine.

## 2015-03-31 ENCOUNTER — Encounter: Payer: Self-pay | Admitting: Pulmonary Disease

## 2015-03-31 ENCOUNTER — Ambulatory Visit (INDEPENDENT_AMBULATORY_CARE_PROVIDER_SITE_OTHER): Payer: Medicare Other | Admitting: Pulmonary Disease

## 2015-03-31 VITALS — BP 122/70 | HR 74 | Temp 98.1°F | Ht 60.0 in | Wt 212.8 lb

## 2015-03-31 DIAGNOSIS — R059 Cough, unspecified: Secondary | ICD-10-CM

## 2015-03-31 DIAGNOSIS — R05 Cough: Secondary | ICD-10-CM

## 2015-03-31 MED ORDER — BECLOMETHASONE DIPROPIONATE 40 MCG/ACT IN AERS: 2.0000 | INHALATION_SPRAY | Freq: Two times a day (BID) | RESPIRATORY_TRACT | Status: DC

## 2015-03-31 MED ORDER — ALBUTEROL SULFATE (2.5 MG/3ML) 0.083% IN NEBU: 2.5000 mg | INHALATION_SOLUTION | Freq: Four times a day (QID) | RESPIRATORY_TRACT | Status: DC | PRN

## 2015-03-31 NOTE — Progress Notes (Addendum)
Subjective:    Patient ID: [name], female    DOB: 1945/11/29, 69 y.o.   MRN: [id number]  HPI Evaluation for cough, bronchospasm.  Mrs. Fichter is a 69 year old with a complex past medical history which includes dermatomyositis, moderate pulmonary hypertension, OSA [noncompliant with CPAP], atrial fibrillation.  She complains of chronic nonproductive cough. Problem has been going on for several years. This is occasionally associated with dyspnea and wheezing. She says that exposure to strong perfumes and cold air, heat, humidity makes his symptoms worse. She has spring and fall allergies. At present she denies any rhinitis, postnasal drip, sinus pain or pressure. She has history of GERD and is on omeprazole for that. She has history of dermatomyositis and is maintained on methotrexate, Plaquenil and low-dose prednisone (5 mg). She has history of hiatal hernia, esophageal dysmotility, dysphagia. She has episodes of choking on food.  She denies any sputum production, hemoptysis. She was seen earlier this month at her cardiologist and primary care office and was given a course of doxycycline and and Depo-Medrol 80 mg injection. She says that this improved her pulmonary symptoms and cough but feels that the effect is wearing off and the symptoms are returning. Chest x-ray was normal.  She was diagnosed with OSA many years ago. She uses the CPAP for a while but then stopped using it since it got very uncomfortable and she felt that the CPAP is blowing air into her hiatal hernia and stomach. She denies any snoring or daytime somnolence.  Past Medical History  Diagnosis Date  . PAF (paroxysmal atrial fibrillation) (HCC)     a. CHA2DS2VASc = 5-->poor OAC candidate 2/2 falls.  . Essential hypertension   . Bilateral leg edema   . Internal hemorrhoids   . GERD (gastroesophageal reflux disease)   . Hiatal hernia   . Dermatomyositis ([place])   . Osteoarthritis   . Hypothyroidism   . Sleep apnea   .  History of pancreatitis   . Chronic diastolic heart failure ([place])   . Shingles   . Gall stones     a. 08/2014 Abd U/S:  Large gallstone measuring 4.2 cm.  . Gout   . Deviated septum   . Hyperlipidemia   . Morbid obesity ([place])   . Continuous urine leakage   . CAD (coronary artery disease)     a. LHC 03/2006: EF 65%, mid LAD 40-50%, ostial D1 70-80%, normal circumflex, normal RCA;  b. Lexiscan Myoview 09/2010: No ischemia or scar, EF 75%;  c. Lex MV 5/14:  Normal, no ischemia, EF 71%  . Chronic diastolic CHF (congestive heart failure) ([place])     a. Echo 8/14:  Mild LVH, EF 55-65%, normal wall motion, Tr AI, mild MR, mod TR, PASP 45;  b. 12/2014 Echo: EF 60-65%, nl wall motion, mildly dil RA/LA, PASP 100mmHg.  Marland Kitchen History of PFTs     a. PFTs 10/2012: normal.   . Cellulitis of right leg     Current outpatient prescriptions:  .  acetaminophen (TYLENOL) 500 MG tablet, Take 1,000 mg by mouth every 6 (six) hours as needed for mild pain., Disp: , Rfl:  .  aspirin EC 325 MG tablet, Take 325 mg by mouth daily., Disp: , Rfl:  .  Cholecalciferol (VITAMIN D3) 2000 UNITS TABS, Take 2,000 Units by mouth daily. , Disp: , Rfl:  .  clobetasol cream (TEMOVATE) 8.31 %, Apply 1 application topically 2 (two) times daily. , Disp: , Rfl: 1 .  fluocinonide ([place])  0.05 % external solution, Apply 1 application topically 2 (two) times daily., Disp: , Rfl: 2 .  FOLIC ACID PO, Take 2 tablets by mouth daily. , Disp: , Rfl:  .  furosemide (LASIX) 40 MG tablet, Take 2 tablets (80 mg total) by mouth 2 (two) times daily., Disp: 120 tablet, Rfl: 5 .  hydrocortisone 2.5 % cream, Apply 1 application topically 2 (two) times daily. , Disp: , Rfl: 2 .  hydroxychloroquine (PLAQUENIL) 200 MG tablet, Take 200 mg by mouth 2 (two) times daily. , Disp: , Rfl:  .  levothyroxine (SYNTHROID, LEVOTHROID) 137 MCG tablet, Take 1 tablet by mouth daily before breakfast., Disp: , Rfl: 5 .  loperamide (IMODIUM) 2 MG capsule, Take 2 mg by mouth as  needed for diarrhea or loose stools., Disp: , Rfl:  .  methotrexate (RHEUMATREX) 2.5 MG tablet, Take 3 tablets by mouth once a week. , Disp: , Rfl: 3 .  metoprolol succinate (TOPROL-XL) 25 MG 24 hr tablet, Take 1 tablet (25 mg total) by mouth daily., Disp: , Rfl:  .  Multiple Vitamin (MULTIVITAMIN) tablet, Take 1 tablet by mouth daily.  , Disp: , Rfl:  .  nitroGLYCERIN (NITROSTAT) 0.4 MG SL tablet, Place 1 tablet (0.4 mg total) under the tongue every 5 (five) minutes as needed. (Patient taking differently: Place 0.4 mg under the tongue every 5 (five) minutes as needed for chest pain. ), Disp: 25 tablet, Rfl: 12 .  nystatin cream (MYCOSTATIN), Apply 1 application topically every evening. , Disp: , Rfl:  .  OVER THE COUNTER MEDICATION, Place 1 drop into both eyes daily as needed (for dry eyes. Advanced eye relief)., Disp: , Rfl:  .  pantoprazole (PROTONIX) 40 MG tablet, Take 40 mg by mouth daily., Disp: , Rfl:  .  potassium chloride SA (KLOR-CON M20) 20 MEQ tablet, Take 2 tablets (40 mEq total) by mouth 3 (three) times daily., Disp: 60 tablet, Rfl: 11 .  predniSONE (DELTASONE) 5 MG tablet, Take 5 mg by mouth daily with breakfast., Disp: , Rfl:  .  albuterol (PROVENTIL) (2.5 MG/3ML) 0.083% nebulizer solution, Take 3 mLs (2.5 mg total) by nebulization every 6 (six) hours as needed for wheezing or shortness of breath., Disp: 75 mL, Rfl: 5 .  beclomethasone (QVAR) 40 MCG/ACT inhaler, Inhale 2 puffs into the lungs 2 (two) times daily., Disp: 1 Inhaler, Rfl: 5  Data:  CXR (03/13/15) No acute cardiopulmonary process.  PFTs (10/26/12) FVC 2.27 (89%) FEV1 1.89 (98%) TLC 86% DLCO 97% No bronchodilator response. Normal spirometry, normal lung volumes, normal diffusion.  Echo (12/19/14) - Left ventricle: The cavity size was normal. Wall thickness was normal. Systolic function was normal. The estimated ejection fraction was in the range of 60% to 65%. Wall motion was normal; there were no regional  wall motion abnormalities. - Left atrium: The atrium was mildly dilated. - Right atrium: The atrium was mildly dilated. - Pulmonary arteries: Systolic pressure was moderately increased. PA peak pressure: 42 mm Hg (S).  Review of Systems Complains of dyspnea on exertion and rest, wheezing Nonproductive cough, Denies any sputum production, hemoptysis. Has a regular heartbeats and palpitations. Denies any chest pain. Has difficulty swallowing, choking on food. Has hand, feet swelling and rash. Denies any nausea, vomiting, diarrhea, constipation. All other review of systems are negative.    Objective:   Physical Exam Blood pressure 122/70, pulse 74, temperature 98.1 F (36.7 C), temperature source Oral, height 5' (1.524 m), weight 212 lb 12.8 oz (96.525 kg),  SpO2 98 %.  Gen.: Pleasant elderly female. No apparent distress Neuro: No gross focal deficits. Neck: No JVD, lymphadenopathy, thyromegaly. RS: Clear. No wheeze or crackles.  CVS: S1-S2 heard, no murmurs rubs gallops. Abdomen: Soft, positive bowel sounds. Extremities: No edema.    Assessment & Plan:   #1 Chronic cough. This could be related to bronchospasm, asthma given her symptoms of wheezing, dyspnea and sensitivity to strong perfumes and hot and cold air. She did get PFTs in 2014 which were entirely normal with no bronchodilator response. But this does not completely rule out asthma and we get a more recent set of lung function tests. I will trial her on albuterol PRN and Qvar.  She does have seasonal allergies and rhinitis that may be contributing to the problem. Her other issues is are dysphagia, esophageal dysmotility, likely related to dermatomyositis. She has GERD and is on Protonix that appears to be controlling her symptoms reasonably well. If the cough is not controlled on inhalers we can address the issue of postnasal drip and GERD.  This is evidence on recent chest x-ray of any interstitial lung disease that may be  related to her dermatomyositis. If the PFTs markedly abnormal we can consider CT of the chest.  #2 Moderate pulmonary hypertension. Likely related to her untreated OSA. Her PA pressures have remained stable. We'll continue to monitor.  #3 OSA This has been untreated for several years. She is intolerant of CPAP. At present she does not have any symptoms related to OSA.  Marshell Garfinkel MD [facility] Pager [phone] If no answer or after 3pm call: [phone] 03/31/2015, 4:19 PM

## 2015-03-31 NOTE — Patient Instructions (Signed)
We will start you on albuterol PRN and Qvar inhaler. We will schedule you for pulmonary function tests. RTC in 3 months.

## 2015-04-01 ENCOUNTER — Telehealth: Payer: Self-pay | Admitting: Pulmonary Disease

## 2015-04-01 DIAGNOSIS — R0609 Other forms of dyspnea: Secondary | ICD-10-CM

## 2015-04-01 DIAGNOSIS — R0600 Dyspnea, unspecified: Secondary | ICD-10-CM

## 2015-04-01 NOTE — Telephone Encounter (Signed)
lmtcb x1 

## 2015-04-02 NOTE — Telephone Encounter (Signed)
lmtcb X2 for pt.  

## 2015-04-03 MED ORDER — FLUTICASONE PROPIONATE HFA 110 MCG/ACT IN AERO: 2.0000 | INHALATION_SPRAY | Freq: Two times a day (BID) | RESPIRATORY_TRACT | Status: DC

## 2015-04-03 NOTE — Telephone Encounter (Signed)
PM - pls advise.

## 2015-04-03 NOTE — Telephone Encounter (Signed)
Spoke with pt, saw PM on Tuesday, albuterol inhaler was called in to pharmacy on Tuesday, was not approved by insurance.   Pt also has no nebulizer at home to use the albuterol neb solution that PM prescribed for her.  Pt needs an order for this.    Pt uses [facility] on [address]    Spoke with pharmacy, it is not the albuterol inhaler that isn't covered by insurance but her Qvar.   Covered alternatives are Serovent, Flovent, Spiriva, and Arnuity.    Dr. Vaughan Browner please advise if you're ok with ordering a nebulizer for pt, and which of the covered alternatives listed above you prefer for the pt.  Thanks!

## 2015-04-03 NOTE — Telephone Encounter (Signed)
Ok with ordering the nebulizer. Order flovent instead of Qvar.

## 2015-04-03 NOTE — Telephone Encounter (Signed)
Order entered for Flovent and Neb Pt notified Nothing further needed.

## 2015-05-21 ENCOUNTER — Encounter: Payer: Self-pay | Admitting: Cardiology

## 2015-06-17 ENCOUNTER — Encounter: Payer: Self-pay | Admitting: Internal Medicine

## 2015-06-17 ENCOUNTER — Ambulatory Visit (INDEPENDENT_AMBULATORY_CARE_PROVIDER_SITE_OTHER): Payer: Medicare Other | Admitting: Internal Medicine

## 2015-06-17 VITALS — BP 106/80 | HR 86 | Ht 60.0 in | Wt 229.0 lb

## 2015-06-17 DIAGNOSIS — I10 Essential (primary) hypertension: Secondary | ICD-10-CM

## 2015-06-17 DIAGNOSIS — I480 Paroxysmal atrial fibrillation: Secondary | ICD-10-CM

## 2015-06-17 DIAGNOSIS — I5032 Chronic diastolic (congestive) heart failure: Secondary | ICD-10-CM | POA: Diagnosis not present

## 2015-06-17 MED ORDER — BENZONATATE 100 MG PO CAPS: 100.0000 mg | ORAL_CAPSULE | Freq: Three times a day (TID) | ORAL | Status: DC | PRN

## 2015-06-17 NOTE — Patient Instructions (Signed)
Medication Instructions:  1) Start Tessalon - Take 100 mg three times a day as needed for cough  Labwork: None ordered   Testing/Procedures: None ordered   Follow-Up: Your physician wants you to follow-up in: 6 months with Chanetta Marshall, NP   Any Other Special Instructions Will Be Listed Below (If Applicable).     If you need a refill on your cardiac medications before your next appointment, please call your pharmacy.

## 2015-06-17 NOTE — Progress Notes (Signed)
PCP: [name], MD Primary Cardiologist: previously Dr Tasia Catchings is a 70 y.o. female who presents today for routine electrophysiology followup.  Since last being seen in our clinic, the patient reports doing reasonably well.  AF is well controlled.  She continues to struggle with obesity/ sedenatry life style, and an inability to try to lose weight.  Her dematomyositis has been active lately requiring high dose steroids  She has a cough for which she is followed by primary care. Today, she denies symptoms of palpitations, chest pain, shortness of breath (above baseline),  lower extremity edema (above baseline), dizziness, presyncope, or syncope.  The patient is otherwise without complaint today.   Past Medical History  Diagnosis Date  . PAF (paroxysmal atrial fibrillation) (HCC)     a. CHA2DS2VASc = 5-->poor OAC candidate 2/2 falls.  . Essential hypertension   . Bilateral leg edema   . Internal hemorrhoids   . GERD (gastroesophageal reflux disease)   . Hiatal hernia   . Dermatomyositis ([place])   . Osteoarthritis   . Hypothyroidism   . Sleep apnea   . History of pancreatitis   . Chronic diastolic heart failure ([place])   . Shingles   . Gall stones     a. 08/2014 Abd U/S:  Large gallstone measuring 4.2 cm.  . Gout   . Deviated septum   . Hyperlipidemia   . Morbid obesity ([place])   . Continuous urine leakage   . CAD (coronary artery disease)     a. LHC 03/2006: EF 65%, mid LAD 40-50%, ostial D1 70-80%, normal circumflex, normal RCA;  b. Lexiscan Myoview 09/2010: No ischemia or scar, EF 75%;  c. Lex MV 5/14:  Normal, no ischemia, EF 71%  . Chronic diastolic CHF (congestive heart failure) ([place])     a. Echo 8/14:  Mild LVH, EF 55-65%, normal wall motion, Tr AI, mild MR, mod TR, PASP 45;  b. 12/2014 Echo: EF 60-65%, nl wall motion, mildly dil RA/LA, PASP 52mmHg.  Marland Kitchen History of PFTs     a. PFTs 10/2012: normal.   . Cellulitis of right leg    Past Surgical History  Procedure  Laterality Date  . Total abdominal hysterectomy    . Nasal septum surgery    . Back surgery  Sept 7 and May 30, 2014    Current Outpatient Prescriptions  Medication Sig Dispense Refill  . acetaminophen (TYLENOL) 500 MG tablet Take 1,000 mg by mouth every 6 (six) hours as needed for mild pain.    Marland Kitchen albuterol (PROVENTIL) (2.5 MG/3ML) 0.083% nebulizer solution Take 3 mLs (2.5 mg total) by nebulization every 6 (six) hours as needed for wheezing or shortness of breath. 75 mL 5  . alendronate (FOSAMAX) 70 MG tablet Take 1 tablet by mouth once a week.    Marland Kitchen aspirin EC 325 MG tablet Take 325 mg by mouth daily.    . cetirizine (ZYRTEC) 10 MG tablet Take 10 mg by mouth daily.    . Cholecalciferol (VITAMIN D3) 2000 UNITS TABS Take 2,000 Units by mouth daily.     . fluocinonide (LIDEX) 0.05 % external solution Apply 1 application topically 2 (two) times daily.  2  . fluticasone (FLOVENT HFA) 110 MCG/ACT inhaler Inhale 2 puffs into the lungs 2 (two) times daily. 1 Inhaler 12  . FOLIC ACID PO Take 2 tablets by mouth daily.     . furosemide (LASIX) 40 MG tablet Take 2 tablets (80 mg total) by mouth 2 (two) times  daily. 120 tablet 5  . hydroxychloroquine (PLAQUENIL) 200 MG tablet Take 200 mg by mouth 2 (two) times daily.     Marland Kitchen levothyroxine (SYNTHROID, LEVOTHROID) 137 MCG tablet Take 1 tablet by mouth daily before breakfast.  5  . loperamide (IMODIUM) 2 MG capsule Take 2 mg by mouth as needed for diarrhea or loose stools.    . methotrexate (RHEUMATREX) 2.5 MG tablet Take 5 tablets by mouth once a week.   3  . metoprolol succinate (TOPROL-XL) 25 MG 24 hr tablet Take 1 tablet (25 mg total) by mouth daily.    . Multiple Vitamin (MULTIVITAMIN) tablet Take 1 tablet by mouth daily.      . nitroGLYCERIN (NITROSTAT) 0.4 MG SL tablet Place 1 tablet (0.4 mg total) under the tongue every 5 (five) minutes as needed. (Patient taking differently: Place 0.4 mg under the tongue every 5 (five) minutes as needed for chest  pain. ) 25 tablet 12  . OVER THE COUNTER MEDICATION Place 1 drop into both eyes daily as needed (for dry eyes. Advanced eye relief).    . pantoprazole (PROTONIX) 40 MG tablet Take 40 mg by mouth daily.    . potassium chloride SA (KLOR-CON M20) 20 MEQ tablet Take 2 tablets (40 mEq total) by mouth 3 (three) times daily. 60 tablet 11  . predniSONE (DELTASONE) 5 MG tablet Take 5 mg by mouth daily with breakfast.    . benzonatate (TESSALON) 100 MG capsule Take 1 capsule (100 mg total) by mouth 3 (three) times daily as needed for cough. 20 capsule 0   No current facility-administered medications for this visit.    Physical Exam: Filed Vitals:   06/17/15 1111  BP: 106/80  Pulse: 86  Height: 5' (1.524 m)  Weight: 229 lb (103.874 kg)   GEN- The patient is overweight and chronically ill appearing, alert and oriented x 3 today.  Head- normocephalic, atraumatic  Eyes- Sclera clear, conjunctiva pink  Ears- hearing intact  Oropharynx- clear  Neck- supple,   Lungs- Clear to ausculation bilaterally, normal work of breathing  Heart- Regular rate and rhythm, no murmurs, rubs or gallops, PMI not laterally displaced  GI- soft, NT, ND, + BS  Extremities- no clubbing, cyanosis, or edema   EKG today reveals sinus rhythm with PACs  Assessment and Plan:  1. Atrial fibrillation  Doing well off of AAD therapy. Her tremor is better off of amiodarone.  If her afib recurs then she would prefer to restart sotalol.  Tikosyn would also be an option.  She is currently off of anticoagulation due to prior falls and is felt to be a poor candidate for coumadin going forward.  We can reconsider depending on her clinical progress.  She may be a candidate for watchman if her overall health improves and she continues to lose weight.  2. CAD Continue current medical therapy  No ischemic symptoms  3. Morbid obesity Weight reduction is encouraged today.  She is making some progress.  4. Chronic diastolic  dysfunction Stable No change required today   She will return to see Chanetta Marshall in 6 months I will see in a year  [name] MD, [facility] 06/17/2015 11:45 AM

## 2015-06-22 ENCOUNTER — Encounter: Payer: Self-pay | Admitting: Internal Medicine

## 2015-07-01 ENCOUNTER — Ambulatory Visit (INDEPENDENT_AMBULATORY_CARE_PROVIDER_SITE_OTHER): Payer: Medicare Other | Admitting: Pulmonary Disease

## 2015-07-01 ENCOUNTER — Encounter: Payer: Self-pay | Admitting: Pulmonary Disease

## 2015-07-01 VITALS — BP 126/66 | HR 75 | Ht 59.0 in | Wt 224.0 lb

## 2015-07-01 DIAGNOSIS — R05 Cough: Secondary | ICD-10-CM

## 2015-07-01 DIAGNOSIS — R059 Cough, unspecified: Secondary | ICD-10-CM

## 2015-07-01 LAB — PULMONARY FUNCTION TEST
DL/VA % pred: 97 %
DL/VA: 3.99 ml/min/mmHg/L
DLCO unc % pred: 60 %
DLCO unc: 10.63 ml/min/mmHg
FEF 25-75 Post: 1.16 L/sec
FEF 25-75 Pre: 1.09 L/sec
FEF2575-%Change-Post: 6 %
FEF2575-%Pred-Post: 69 %
FEF2575-%Pred-Pre: 65 %
FEV1-%Change-Post: 0 %
FEV1-%Pred-Post: 61 %
FEV1-%Pred-Pre: 61 %
FEV1-Post: 1.13 L
FEV1-Pre: 1.14 L
FEV1FVC-%Change-Post: 4 %
FEV1FVC-%Pred-Pre: 106 %
FEV6-%Change-Post: -4 %
FEV6-%Pred-Post: 57 %
FEV6-%Pred-Pre: 60 %
FEV6-Post: 1.34 L
FEV6-Pre: 1.41 L
FEV6FVC-%Pred-Post: 105 %
FEV6FVC-%Pred-Pre: 105 %
FVC-%Change-Post: -4 %
FVC-%Pred-Post: 54 %
FVC-%Pred-Pre: 57 %
FVC-Post: 1.34 L
FVC-Pre: 1.41 L
Post FEV1/FVC ratio: 85 %
Post FEV6/FVC ratio: 100 %
Pre FEV1/FVC ratio: 81 %
Pre FEV6/FVC Ratio: 100 %

## 2015-07-01 MED ORDER — PANTOPRAZOLE SODIUM 40 MG PO TBEC: 40.0000 mg | DELAYED_RELEASE_TABLET | Freq: Two times a day (BID) | ORAL | Status: DC

## 2015-07-01 MED ORDER — FLUTICASONE PROPIONATE 50 MCG/ACT NA SUSP: 2.0000 | Freq: Every day | NASAL | Status: DC

## 2015-07-01 NOTE — Progress Notes (Signed)
PFT done today. 

## 2015-07-01 NOTE — Progress Notes (Signed)
Subjective:    Patient ID: [name], female    DOB: 06-12-1946, 70 y.o.   MRN: [id number]  PROBLEM LIST: Cough, wheezing Dermatomyositis Mod Pulm HTN OSA. Non compliant   HPI Follow up for cough, bronchospasm.  [name] is a 70 year old with a complex past medical history which includes dermatomyositis, moderate pulmonary hypertension, OSA [noncompliant with CPAP], atrial fibrillation.  She complains of chronic nonproductive cough. Problem has been going on for several years. This is occasionally associated with dyspnea and wheezing. She says that exposure to strong perfumes and cold air, heat, humidity makes his symptoms worse. She has spring and fall allergies. At present she denies any rhinitis, postnasal drip, sinus pain or pressure. She has history of GERD and is on omeprazole for that. She has history of dermatomyositis and is maintained on methotrexate, Plaquenil and low-dose prednisone (5 mg). She has history of hiatal hernia, esophageal dysmotility, dysphagia. She has episodes of choking on food.  She denies any sputum production, hemoptysis. She was seen last year and was given a course of doxycycline and and Depo-Medrol 80 mg injection. She says that this improved her pulmonary symptoms and cough but feels that the symptoms returned.   She was diagnosed with OSA many years ago. She uses the CPAP for a while but then stopped using it since it got very uncomfortable and she felt that the CPAP is blowing air into her hiatal hernia and stomach. She denies any snoring or daytime somnolence.   Past Medical History  Diagnosis Date  . PAF (paroxysmal atrial fibrillation) (HCC)     a. CHA2DS2VASc = 5-->poor OAC candidate 2/2 falls.  . Essential hypertension   . Bilateral leg edema   . Internal hemorrhoids   . GERD (gastroesophageal reflux disease)   . Hiatal hernia   . Dermatomyositis ([place])   . Osteoarthritis   . Hypothyroidism   . Sleep apnea   . History of pancreatitis     . Chronic diastolic heart failure ([place])   . Shingles   . Gall stones     a. 08/2014 Abd U/S:  Large gallstone measuring 4.2 cm.  . Gout   . Deviated septum   . Hyperlipidemia   . Morbid obesity ([place])   . Continuous urine leakage   . CAD (coronary artery disease)     a. LHC 03/2006: EF 65%, mid LAD 40-50%, ostial D1 70-80%, normal circumflex, normal RCA;  b. Lexiscan Myoview 09/2010: No ischemia or scar, EF 75%;  c. Lex MV 5/14:  Normal, no ischemia, EF 71%  . Chronic diastolic CHF (congestive heart failure) ([place])     a. Echo 8/14:  Mild LVH, EF 55-65%, normal wall motion, Tr AI, mild MR, mod TR, PASP 45;  b. 12/2014 Echo: EF 60-65%, nl wall motion, mildly dil RA/LA, PASP 22mmHg.  Marland Kitchen History of PFTs     a. PFTs 10/2012: normal.   . Cellulitis of right leg     Current outpatient prescriptions:  .  acetaminophen (TYLENOL) 500 MG tablet, Take 1,000 mg by mouth every 6 (six) hours as needed for mild pain., Disp: , Rfl:  .  albuterol (PROVENTIL) (2.5 MG/3ML) 0.083% nebulizer solution, Take 3 mLs (2.5 mg total) by nebulization every 6 (six) hours as needed for wheezing or shortness of breath., Disp: 75 mL, Rfl: 5 .  alendronate (FOSAMAX) 70 MG tablet, Take 1 tablet by mouth once a week., Disp: , Rfl:  .  aspirin EC 325 MG tablet, Take 325  mg by mouth daily., Disp: , Rfl:  .  benzonatate (TESSALON) 100 MG capsule, Take 1 capsule (100 mg total) by mouth 3 (three) times daily as needed for cough., Disp: 20 capsule, Rfl: 0 .  cetirizine (ZYRTEC) 10 MG tablet, Take 10 mg by mouth daily., Disp: , Rfl:  .  Cholecalciferol (VITAMIN D3) 2000 UNITS TABS, Take 2,000 Units by mouth daily. , Disp: , Rfl:  .  fluocinonide (LIDEX) 0.05 % external solution, Apply 1 application topically 2 (two) times daily., Disp: , Rfl: 2 .  fluticasone (FLOVENT HFA) 110 MCG/ACT inhaler, Inhale 2 puffs into the lungs 2 (two) times daily., Disp: 1 Inhaler, Rfl: 12 .  FOLIC ACID PO, Take 2 tablets by mouth daily. , Disp: , Rfl:   .  furosemide (LASIX) 40 MG tablet, Take 2 tablets (80 mg total) by mouth 2 (two) times daily., Disp: 120 tablet, Rfl: 5 .  hydroxychloroquine (PLAQUENIL) 200 MG tablet, Take 200 mg by mouth 2 (two) times daily. , Disp: , Rfl:  .  levothyroxine (SYNTHROID, LEVOTHROID) 137 MCG tablet, Take 1 tablet by mouth daily before breakfast., Disp: , Rfl: 5 .  loperamide (IMODIUM) 2 MG capsule, Take 2 mg by mouth as needed for diarrhea or loose stools., Disp: , Rfl:  .  methotrexate (RHEUMATREX) 2.5 MG tablet, Take 5 tablets by mouth once a week. , Disp: , Rfl: 3 .  metoprolol succinate (TOPROL-XL) 25 MG 24 hr tablet, Take 1 tablet (25 mg total) by mouth daily., Disp: , Rfl:  .  Multiple Vitamin (MULTIVITAMIN) tablet, Take 1 tablet by mouth daily.  , Disp: , Rfl:  .  nitroGLYCERIN (NITROSTAT) 0.4 MG SL tablet, Place 1 tablet (0.4 mg total) under the tongue every 5 (five) minutes as needed. (Patient taking differently: Place 0.4 mg under the tongue every 5 (five) minutes as needed for chest pain. ), Disp: 25 tablet, Rfl: 12 .  OVER THE COUNTER MEDICATION, Place 1 drop into both eyes daily as needed (for dry eyes. Advanced eye relief)., Disp: , Rfl:  .  pantoprazole (PROTONIX) 40 MG tablet, Take 40 mg by mouth daily., Disp: , Rfl:  .  potassium chloride SA (KLOR-CON M20) 20 MEQ tablet, Take 2 tablets (40 mEq total) by mouth 3 (three) times daily., Disp: 60 tablet, Rfl: 11 .  predniSONE (DELTASONE) 5 MG tablet, Take 5 mg by mouth daily with breakfast., Disp: , Rfl:   Data:  CXR (03/13/15) No acute cardiopulmonary process.  PFTs (10/26/12) FVC 2.27 (89%) FEV1 1.89 (98%) TLC 86% DLCO 97% No bronchodilator response. Normal spirometry, normal lung volumes, normal diffusion.  PFTs [07/01/15] FVC 1.41 (57%) FEV1 1.14 (61%) F/F 81 TLC 60% DLCO 60% Severe restriction, moderate reduction in diffusion capacity, no bronchodilator response  Echo (12/19/14) - Left ventricle: The cavity size was normal. Wall  thickness was normal. Systolic function was normal. The estimated ejection fraction was in the range of 60% to 65%. Wall motion was normal; there were no regional wall motion abnormalities. - Left atrium: The atrium was mildly dilated. - Right atrium: The atrium was mildly dilated. - Pulmonary arteries: Systolic pressure was moderately increased. PA peak pressure: 42 mm Hg (S).  Review of Systems Complains of dyspnea on exertion and rest, wheezing Nonproductive cough, Denies any sputum production, hemoptysis. Has a regular heartbeats and palpitations. Denies any chest pain. Has difficulty swallowing, choking on food. Has hand, feet swelling and rash. Denies any nausea, vomiting, diarrhea, constipation. All other review of systems are  negative.    Objective:   Physical Exam Blood pressure 126/66, pulse 75, height 4\' 11"  (1.499 m), weight 224 lb (101.606 kg), SpO2 98 %.  Gen.: Pleasant elderly female. No apparent distress Neuro: No gross focal deficits. Neck: No JVD, lymphadenopathy, thyromegaly. RS: Clear. No wheeze or crackles.  CVS: S1-S2 heard, no murmurs rubs gallops. Abdomen: Soft, positive bowel sounds. Extremities: No edema.    Assessment & Plan:   #1 Chronic cough. This could be related to bronchospasm, asthma given her symptoms of wheezing, dyspnea and sensitivity to strong perfumes and hot and cold air. She was given albuterol when necessary and Flovent at last visit. She just started taking the albuterol as a nebulizer and does not use the Flovent since it's too expensive through her insurance.  She does have seasonal allergies and rhinitis that may be contributing to the problem. Her other issues is are dysphagia, esophageal dysmotility, likely related to dermatomyositis. She has GERD and is on Protonix. I will start her on a Flonase nasal spray and increase Protonix to 40 twice daily  #2 Dermatomyositis. She's had increase in symptoms of dermatomyositis over  the past year requiring and initiation of methotrexate and Plaquenil, she is on chronic prednisone. Her PFTs show a new reduction in lung volumes and diffusion capacity. This is concerning for and ILD although her recent chest x-ray was normal. I will evaluate this by a high-resolution CT of the chest.  #2 Moderate pulmonary hypertension. Likely related to her untreated OSA. Her PA pressures have remained stable. We'll continue to monitor.  #3 OSA This has been untreated for several years. She is intolerant of CPAP. At present she does not have any symptoms related to OSA.  Plan: - Start Flonase nasal spray - Increase Protonix to 40 mg twice a day - Use albuterol nebulizers 2 times a day - High-resolution CT of the chest.  Marshell Garfinkel MD [facility] Pager [phone] If no answer or after 3pm call: [phone] 07/01/2015, 2:08 PM                                                                                                                                                                 Subjective:    Patient ID: [name], female    DOB: Apr 07, 1946, 70 y.o.   MRN: [id number]  HPI Evaluation for cough, bronchospasm.  Mrs. Escher is a 70 year old with a complex past medical history which includes dermatomyositis, moderate pulmonary hypertension, OSA [noncompliant with CPAP], atrial fibrillation.  She complains of chronic nonproductive cough. Problem has been going on for several years. This is occasionally associated with dyspnea and wheezing. She says that exposure to strong perfumes and cold air, heat, humidity makes his symptoms worse. She has  spring and fall allergies. At present she denies any rhinitis, postnasal drip, sinus pain or pressure. She has history of GERD and is on omeprazole for that. She has  history of dermatomyositis and is maintained on methotrexate, Plaquenil and low-dose prednisone (5 mg). She has history of hiatal hernia, esophageal dysmotility, dysphagia. She has episodes of choking on food.  She denies any sputum production, hemoptysis. She was seen earlier this month at her cardiologist and primary care office and was given a course of doxycycline and and Depo-Medrol 80 mg injection. She says that this improved her pulmonary symptoms and cough but feels that the effect is wearing off and the symptoms are returning. Chest x-ray was normal.  She was diagnosed with OSA many years ago. She uses the CPAP for a while but then stopped using it since it got very uncomfortable and she felt that the CPAP is blowing air into her hiatal hernia and stomach. She denies any snoring or daytime somnolence.  Past Medical History  Diagnosis Date  . PAF (paroxysmal atrial fibrillation) (HCC)     a. CHA2DS2VASc = 5-->poor OAC candidate 2/2 falls.  . Essential hypertension   . Bilateral leg edema   . Internal hemorrhoids   . GERD (gastroesophageal reflux disease)   . Hiatal hernia   . Dermatomyositis ([place])   . Osteoarthritis   . Hypothyroidism   . Sleep apnea   . History of pancreatitis   . Chronic diastolic heart failure ([place])   . Shingles   . Gall stones     a. 08/2014 Abd U/S:  Large gallstone measuring 4.2 cm.  . Gout   . Deviated septum   . Hyperlipidemia   . Morbid obesity ([place])   . Continuous urine leakage   . CAD (coronary artery disease)     a. LHC 03/2006: EF 65%, mid LAD 40-50%, ostial D1 70-80%, normal circumflex, normal RCA;  b. Lexiscan Myoview 09/2010: No ischemia or scar, EF 75%;  c. Lex MV 5/14:  Normal, no ischemia, EF 71%  . Chronic diastolic CHF (congestive heart failure) ([place])     a. Echo 8/14:  Mild LVH, EF 55-65%, normal wall motion, Tr AI, mild MR, mod TR, PASP 45;  b. 12/2014 Echo: EF 60-65%, nl wall motion, mildly dil RA/LA, PASP 75mmHg.  Marland Kitchen History of PFTs      a. PFTs 10/2012: normal.   . Cellulitis of right leg     Current outpatient prescriptions:  .  acetaminophen (TYLENOL) 500 MG tablet, Take 1,000 mg by mouth every 6 (six) hours as needed for mild pain., Disp: , Rfl:  .  albuterol (PROVENTIL) (2.5 MG/3ML) 0.083% nebulizer solution, Take 3 mLs (2.5 mg total) by nebulization every 6 (six) hours as needed for wheezing or shortness of breath., Disp: 75 mL, Rfl: 5 .  alendronate (FOSAMAX) 70 MG tablet, Take 1 tablet by mouth once a week., Disp: , Rfl:  .  aspirin EC 325 MG tablet, Take 325 mg by mouth daily., Disp: , Rfl:  .  benzonatate (TESSALON) 100 MG capsule, Take 1 capsule (100 mg total) by mouth 3 (three) times daily as needed for cough., Disp: 20 capsule, Rfl: 0 .  cetirizine (ZYRTEC) 10 MG tablet, Take 10 mg by mouth daily., Disp: , Rfl:  .  Cholecalciferol (VITAMIN D3) 2000 UNITS TABS, Take 2,000 Units by mouth daily. , Disp: , Rfl:  .  fluocinonide (LIDEX) 0.05 % external solution, Apply 1 application topically 2 (two) times daily., Disp: ,  Rfl: 2 .  fluticasone (FLOVENT HFA) 110 MCG/ACT inhaler, Inhale 2 puffs into the lungs 2 (two) times daily., Disp: 1 Inhaler, Rfl: 12 .  FOLIC ACID PO, Take 2 tablets by mouth daily. , Disp: , Rfl:  .  furosemide (LASIX) 40 MG tablet, Take 2 tablets (80 mg total) by mouth 2 (two) times daily., Disp: 120 tablet, Rfl: 5 .  hydroxychloroquine (PLAQUENIL) 200 MG tablet, Take 200 mg by mouth 2 (two) times daily. , Disp: , Rfl:  .  levothyroxine (SYNTHROID, LEVOTHROID) 137 MCG tablet, Take 1 tablet by mouth daily before breakfast., Disp: , Rfl: 5 .  loperamide (IMODIUM) 2 MG capsule, Take 2 mg by mouth as needed for diarrhea or loose stools., Disp: , Rfl:  .  methotrexate (RHEUMATREX) 2.5 MG tablet, Take 5 tablets by mouth once a week. , Disp: , Rfl: 3 .  metoprolol succinate (TOPROL-XL) 25 MG 24 hr tablet, Take 1 tablet (25 mg total) by mouth daily., Disp: , Rfl:  .  Multiple Vitamin (MULTIVITAMIN) tablet,  Take 1 tablet by mouth daily.  , Disp: , Rfl:  .  nitroGLYCERIN (NITROSTAT) 0.4 MG SL tablet, Place 1 tablet (0.4 mg total) under the tongue every 5 (five) minutes as needed. (Patient taking differently: Place 0.4 mg under the tongue every 5 (five) minutes as needed for chest pain. ), Disp: 25 tablet, Rfl: 12 .  OVER THE COUNTER MEDICATION, Place 1 drop into both eyes daily as needed (for dry eyes. Advanced eye relief)., Disp: , Rfl:  .  pantoprazole (PROTONIX) 40 MG tablet, Take 1 tablet (40 mg total) by mouth 2 (two) times daily before a meal., Disp: 60 tablet, Rfl: 3 .  potassium chloride SA (KLOR-CON M20) 20 MEQ tablet, Take 2 tablets (40 mEq total) by mouth 3 (three) times daily., Disp: 60 tablet, Rfl: 11 .  predniSONE (DELTASONE) 5 MG tablet, Take 5 mg by mouth daily with breakfast., Disp: , Rfl:  .  fluticasone (FLONASE) 50 MCG/ACT nasal spray, Place 2 sprays into both nostrils daily., Disp: 16 g, Rfl: 3  Data:  CXR (03/13/15) No acute cardiopulmonary process.  PFTs (10/26/12) FVC 2.27 (89%) FEV1 1.89 (98%) TLC 86% DLCO 97% No bronchodilator response. Normal spirometry, normal lung volumes, normal diffusion.  Echo (12/19/14) - Left ventricle: The cavity size was normal. Wall thickness was normal. Systolic function was normal. The estimated ejection fraction was in the range of 60% to 65%. Wall motion was normal; there were no regional wall motion abnormalities. - Left atrium: The atrium was mildly dilated. - Right atrium: The atrium was mildly dilated. - Pulmonary arteries: Systolic pressure was moderately increased. PA peak pressure: 42 mm Hg (S).  Review of Systems Complains of dyspnea on exertion and rest, wheezing Nonproductive cough, Denies any sputum production, hemoptysis. Has a regular heartbeats and palpitations. Denies any chest pain. Has difficulty swallowing, choking on food. Has hand, feet swelling and rash. Denies any nausea, vomiting, diarrhea,  constipation. All other review of systems are negative.    Objective:   Physical Exam Blood pressure 122/70, pulse 74, temperature 98.1 F (36.7 C), temperature source Oral, height 5' (1.524 m), weight 212 lb 12.8 oz (96.525 kg), SpO2 98 %.  Gen.: Pleasant elderly female. No apparent distress Neuro: No gross focal deficits. Neck: No JVD, lymphadenopathy, thyromegaly. RS: Clear. No wheeze or crackles.  CVS: S1-S2 heard, no murmurs rubs gallops. Abdomen: Soft, positive bowel sounds. Extremities: No edema.    Assessment & Plan:   #1  Chronic cough. This could be related to bronchospasm, asthma given her symptoms of wheezing, dyspnea and sensitivity to strong perfumes and hot and cold air. She did get PFTs in 2014 which were entirely normal with no bronchodilator response. But this does not completely rule out asthma and we get a more recent set of lung function tests. I will trial her on albuterol PRN and Qvar.  She does have seasonal allergies and rhinitis that may be contributing to the problem. Her other issues is are dysphagia, esophageal dysmotility, likely related to dermatomyositis. She has GERD and is on Protonix that appears to be controlling her symptoms reasonably well. If the cough is not controlled on inhalers we can address the issue of postnasal drip and GERD.  This is evidence on recent chest x-ray of any interstitial lung disease that may be related to her dermatomyositis. If the PFTs markedly abnormal we can consider CT of the chest.  #2 Moderate pulmonary hypertension. Likely related to her untreated OSA. Her PA pressures have remained stable. We'll continue to monitor.  #3 OSA This has been untreated for several years. She is intolerant of CPAP. At present she does not have any symptoms related to OSA.  Marshell Garfinkel MD [facility] Pager [phone] If no answer or after 3pm call: [phone] 07/01/2015, 4:42  PM

## 2015-07-01 NOTE — Patient Instructions (Signed)
We start you on a Flonase nasal spray. Increase your Protonix to 40 mg twice daily. Continue using the albuterol nebulizer twice daily. We will schedule you for a high-resolution CT of the chest.  Return to clinic in 1 month.

## 2015-07-07 ENCOUNTER — Ambulatory Visit (INDEPENDENT_AMBULATORY_CARE_PROVIDER_SITE_OTHER)
Admission: RE | Admit: 2015-07-07 | Discharge: 2015-07-07 | Disposition: A | Discharge status: Home / Self Care | Payer: Medicare Other | Source: Ambulatory Visit | Attending: Pulmonary Disease | Admitting: Pulmonary Disease

## 2015-07-07 DIAGNOSIS — R05 Cough: Secondary | ICD-10-CM | POA: Diagnosis not present

## 2015-07-07 DIAGNOSIS — R059 Cough, unspecified: Secondary | ICD-10-CM

## 2015-07-09 DIAGNOSIS — Z79899 Other long term (current) drug therapy: Secondary | ICD-10-CM | POA: Diagnosis not present

## 2015-07-09 DIAGNOSIS — M150 Primary generalized (osteo)arthritis: Secondary | ICD-10-CM | POA: Diagnosis not present

## 2015-07-09 DIAGNOSIS — M0579 Rheumatoid arthritis with rheumatoid factor of multiple sites without organ or systems involvement: Secondary | ICD-10-CM | POA: Diagnosis not present

## 2015-07-09 DIAGNOSIS — M3312 Other dermatopolymyositis with myopathy: Secondary | ICD-10-CM | POA: Diagnosis not present

## 2015-07-22 DIAGNOSIS — M25561 Pain in right knee: Secondary | ICD-10-CM | POA: Diagnosis not present

## 2015-07-22 DIAGNOSIS — B372 Candidiasis of skin and nail: Secondary | ICD-10-CM | POA: Diagnosis not present

## 2015-07-29 DIAGNOSIS — R600 Localized edema: Secondary | ICD-10-CM | POA: Diagnosis not present

## 2015-07-29 DIAGNOSIS — K760 Fatty (change of) liver, not elsewhere classified: Secondary | ICD-10-CM | POA: Diagnosis not present

## 2015-07-29 DIAGNOSIS — Z7951 Long term (current) use of inhaled steroids: Secondary | ICD-10-CM | POA: Diagnosis not present

## 2015-07-29 DIAGNOSIS — Z7982 Long term (current) use of aspirin: Secondary | ICD-10-CM | POA: Diagnosis not present

## 2015-07-29 DIAGNOSIS — I10 Essential (primary) hypertension: Secondary | ICD-10-CM | POA: Diagnosis not present

## 2015-07-29 DIAGNOSIS — Z4800 Encounter for change or removal of nonsurgical wound dressing: Secondary | ICD-10-CM | POA: Diagnosis not present

## 2015-07-29 DIAGNOSIS — I4891 Unspecified atrial fibrillation: Secondary | ICD-10-CM | POA: Diagnosis not present

## 2015-07-29 DIAGNOSIS — B372 Candidiasis of skin and nail: Secondary | ICD-10-CM | POA: Diagnosis not present

## 2015-07-29 DIAGNOSIS — R238 Other skin changes: Secondary | ICD-10-CM | POA: Diagnosis not present

## 2015-07-30 ENCOUNTER — Other Ambulatory Visit: Payer: Self-pay | Admitting: Internal Medicine

## 2015-07-30 ENCOUNTER — Telehealth: Payer: Self-pay | Admitting: Pulmonary Disease

## 2015-07-30 NOTE — Telephone Encounter (Signed)
Spoke with pt, states that she is getting shaky and getting "gas bubbles in my stomach" when taking albuterol nebulizer.  Pt has stopped using this, stating that she doesn't need it often, but seldom her husband will notice her wheezing.  Pt wants to know if there is an alternative she should use that would not have such severe side effects.   Pt uses [facility] on [address]    PM please advise.  Thanks!

## 2015-07-30 NOTE — Progress Notes (Signed)
Quick Note:  Called spoke with patient to discuss CT results / recs as stated by PM. Pt voiced her understanding but did report that she was started on Doxycycline 100mg  BID x10 days today for a wound on her lower leg. Dr Vaughan Browner, please advise if you would like pt to still have her sputum cultures. Thank you. ______

## 2015-07-30 NOTE — Progress Notes (Signed)
Quick Note:  Called spoke with patient and informed her of PM's recs. Pt voiced her understanding and denied any questions/concerns. ______

## 2015-07-31 ENCOUNTER — Other Ambulatory Visit: Payer: Self-pay | Admitting: *Deleted

## 2015-07-31 DIAGNOSIS — Z7982 Long term (current) use of aspirin: Secondary | ICD-10-CM | POA: Diagnosis not present

## 2015-07-31 DIAGNOSIS — Z7951 Long term (current) use of inhaled steroids: Secondary | ICD-10-CM | POA: Diagnosis not present

## 2015-07-31 DIAGNOSIS — Z4800 Encounter for change or removal of nonsurgical wound dressing: Secondary | ICD-10-CM | POA: Diagnosis not present

## 2015-07-31 DIAGNOSIS — I10 Essential (primary) hypertension: Secondary | ICD-10-CM | POA: Diagnosis not present

## 2015-07-31 DIAGNOSIS — B372 Candidiasis of skin and nail: Secondary | ICD-10-CM | POA: Diagnosis not present

## 2015-07-31 DIAGNOSIS — R238 Other skin changes: Secondary | ICD-10-CM | POA: Diagnosis not present

## 2015-07-31 DIAGNOSIS — I4891 Unspecified atrial fibrillation: Secondary | ICD-10-CM | POA: Diagnosis not present

## 2015-07-31 DIAGNOSIS — R600 Localized edema: Secondary | ICD-10-CM | POA: Diagnosis not present

## 2015-07-31 DIAGNOSIS — K760 Fatty (change of) liver, not elsewhere classified: Secondary | ICD-10-CM | POA: Diagnosis not present

## 2015-07-31 MED ORDER — METOPROLOL SUCCINATE ER 25 MG PO TB24: ORAL_TABLET | ORAL | Status: DC

## 2015-07-31 NOTE — Telephone Encounter (Signed)
Can you make sure that she using it only on as needed basis? She does not need to use it regularly. If she cannot tolerate it then it is OK to D/C it. No really good alternative to this.

## 2015-07-31 NOTE — Telephone Encounter (Signed)
Called spoke with pt. She reports she only uses this PRN but does not feel she can tolerate it. She will d/c it. Nothing further needed

## 2015-08-03 DIAGNOSIS — I4891 Unspecified atrial fibrillation: Secondary | ICD-10-CM | POA: Diagnosis not present

## 2015-08-03 DIAGNOSIS — I10 Essential (primary) hypertension: Secondary | ICD-10-CM | POA: Diagnosis not present

## 2015-08-03 DIAGNOSIS — Z7951 Long term (current) use of inhaled steroids: Secondary | ICD-10-CM | POA: Diagnosis not present

## 2015-08-03 DIAGNOSIS — B372 Candidiasis of skin and nail: Secondary | ICD-10-CM | POA: Diagnosis not present

## 2015-08-03 DIAGNOSIS — R600 Localized edema: Secondary | ICD-10-CM | POA: Diagnosis not present

## 2015-08-03 DIAGNOSIS — R238 Other skin changes: Secondary | ICD-10-CM | POA: Diagnosis not present

## 2015-08-03 DIAGNOSIS — Z4800 Encounter for change or removal of nonsurgical wound dressing: Secondary | ICD-10-CM | POA: Diagnosis not present

## 2015-08-03 DIAGNOSIS — Z7982 Long term (current) use of aspirin: Secondary | ICD-10-CM | POA: Diagnosis not present

## 2015-08-03 DIAGNOSIS — K760 Fatty (change of) liver, not elsewhere classified: Secondary | ICD-10-CM | POA: Diagnosis not present

## 2015-08-04 DIAGNOSIS — K760 Fatty (change of) liver, not elsewhere classified: Secondary | ICD-10-CM | POA: Diagnosis not present

## 2015-08-04 DIAGNOSIS — Z7951 Long term (current) use of inhaled steroids: Secondary | ICD-10-CM | POA: Diagnosis not present

## 2015-08-04 DIAGNOSIS — I4891 Unspecified atrial fibrillation: Secondary | ICD-10-CM | POA: Diagnosis not present

## 2015-08-04 DIAGNOSIS — R238 Other skin changes: Secondary | ICD-10-CM | POA: Diagnosis not present

## 2015-08-04 DIAGNOSIS — Z7982 Long term (current) use of aspirin: Secondary | ICD-10-CM | POA: Diagnosis not present

## 2015-08-04 DIAGNOSIS — I10 Essential (primary) hypertension: Secondary | ICD-10-CM | POA: Diagnosis not present

## 2015-08-04 DIAGNOSIS — Z4800 Encounter for change or removal of nonsurgical wound dressing: Secondary | ICD-10-CM | POA: Diagnosis not present

## 2015-08-04 DIAGNOSIS — R600 Localized edema: Secondary | ICD-10-CM | POA: Diagnosis not present

## 2015-08-04 DIAGNOSIS — B372 Candidiasis of skin and nail: Secondary | ICD-10-CM | POA: Diagnosis not present

## 2015-08-06 DIAGNOSIS — Z7982 Long term (current) use of aspirin: Secondary | ICD-10-CM | POA: Diagnosis not present

## 2015-08-06 DIAGNOSIS — I10 Essential (primary) hypertension: Secondary | ICD-10-CM | POA: Diagnosis not present

## 2015-08-06 DIAGNOSIS — Z7951 Long term (current) use of inhaled steroids: Secondary | ICD-10-CM | POA: Diagnosis not present

## 2015-08-06 DIAGNOSIS — B372 Candidiasis of skin and nail: Secondary | ICD-10-CM | POA: Diagnosis not present

## 2015-08-06 DIAGNOSIS — R600 Localized edema: Secondary | ICD-10-CM | POA: Diagnosis not present

## 2015-08-06 DIAGNOSIS — K760 Fatty (change of) liver, not elsewhere classified: Secondary | ICD-10-CM | POA: Diagnosis not present

## 2015-08-06 DIAGNOSIS — R238 Other skin changes: Secondary | ICD-10-CM | POA: Diagnosis not present

## 2015-08-06 DIAGNOSIS — I4891 Unspecified atrial fibrillation: Secondary | ICD-10-CM | POA: Diagnosis not present

## 2015-08-06 DIAGNOSIS — Z4800 Encounter for change or removal of nonsurgical wound dressing: Secondary | ICD-10-CM | POA: Diagnosis not present

## 2015-08-07 ENCOUNTER — Ambulatory Visit (INDEPENDENT_AMBULATORY_CARE_PROVIDER_SITE_OTHER): Payer: Medicare Other | Admitting: Pulmonary Disease

## 2015-08-07 ENCOUNTER — Encounter: Payer: Self-pay | Admitting: Pulmonary Disease

## 2015-08-07 VITALS — BP 116/64 | HR 75 | Ht 59.0 in | Wt 209.0 lb

## 2015-08-07 DIAGNOSIS — R0609 Other forms of dyspnea: Secondary | ICD-10-CM | POA: Diagnosis not present

## 2015-08-07 DIAGNOSIS — R0600 Dyspnea, unspecified: Secondary | ICD-10-CM

## 2015-08-07 DIAGNOSIS — R05 Cough: Secondary | ICD-10-CM

## 2015-08-07 DIAGNOSIS — R059 Cough, unspecified: Secondary | ICD-10-CM

## 2015-08-07 NOTE — Progress Notes (Signed)
Subjective:    Patient ID: [name], female    DOB: 06-25-1945, 70 y.o.   MRN: [id number]  PROBLEM LIST: Cough, wheezing Dermatomyositis Mod Pulm HTN OSA. Non compliant   HPI Follow up for cough, bronchospasm.  [name] is a 70 year old with a complex past medical history which includes dermatomyositis, moderate pulmonary hypertension, OSA [noncompliant with CPAP], atrial fibrillation.  She complains of chronic nonproductive cough. Problem has been going on for several years. This is occasionally associated with dyspnea and wheezing. She says that exposure to strong perfumes and cold air, heat, humidity makes his symptoms worse. She has spring and fall allergies. At present she denies any rhinitis, postnasal drip, sinus pain or pressure. She has history of GERD and is on omeprazole for that. She has history of dermatomyositis and is maintained on methotrexate, Plaquenil and low-dose prednisone (5 mg). She has history of hiatal hernia, esophageal dysmotility, dysphagia. She has episodes of choking on food.  She denies any sputum production, hemoptysis. She was seen last year and was given a course of doxycycline and and Depo-Medrol 80 mg injection. She says that this improved her pulmonary symptoms and cough but feels that the symptoms returned.   She was diagnosed with OSA many years ago. She uses the CPAP for a while but then stopped using it since it got very uncomfortable and she felt that the CPAP is blowing air into her hiatal hernia and stomach. She denies any snoring or daytime somnolence.   Past Medical History  Diagnosis Date  . PAF (paroxysmal atrial fibrillation) (HCC)     a. CHA2DS2VASc = 5-->poor OAC candidate 2/2 falls.  . Essential hypertension   . Bilateral leg edema   . Internal hemorrhoids   . GERD (gastroesophageal reflux disease)   . Hiatal hernia   . Dermatomyositis ([place])   . Osteoarthritis   . Hypothyroidism   . Sleep apnea   . History of pancreatitis     . Chronic diastolic heart failure ([place])   . Shingles   . Gall stones     a. 08/2014 Abd U/S:  Large gallstone measuring 4.2 cm.  . Gout   . Deviated septum   . Hyperlipidemia   . Morbid obesity ([place])   . Continuous urine leakage   . CAD (coronary artery disease)     a. LHC 03/2006: EF 65%, mid LAD 40-50%, ostial D1 70-80%, normal circumflex, normal RCA;  b. Lexiscan Myoview 09/2010: No ischemia or scar, EF 75%;  c. Lex MV 5/14:  Normal, no ischemia, EF 71%  . Chronic diastolic CHF (congestive heart failure) ([place])     a. Echo 8/14:  Mild LVH, EF 55-65%, normal wall motion, Tr AI, mild MR, mod TR, PASP 45;  b. 12/2014 Echo: EF 60-65%, nl wall motion, mildly dil RA/LA, PASP 27mmHg.  Marland Kitchen History of PFTs     a. PFTs 10/2012: normal.   . Cellulitis of right leg     Current outpatient prescriptions:  .  acetaminophen (TYLENOL) 500 MG tablet, Take 1,000 mg by mouth every 6 (six) hours as needed for mild pain., Disp: , Rfl:  .  albuterol (PROVENTIL) (2.5 MG/3ML) 0.083% nebulizer solution, Take 3 mLs (2.5 mg total) by nebulization every 6 (six) hours as needed for wheezing or shortness of breath., Disp: 75 mL, Rfl: 5 .  alendronate (FOSAMAX) 70 MG tablet, Take 1 tablet by mouth once a week., Disp: , Rfl:  .  aspirin EC 325 MG tablet, Take 325  mg by mouth daily., Disp: , Rfl:  .  cetirizine (ZYRTEC) 10 MG tablet, Take 10 mg by mouth daily., Disp: , Rfl:  .  Cholecalciferol (VITAMIN D3) 2000 UNITS TABS, Take 2,000 Units by mouth daily. , Disp: , Rfl:  .  fluocinonide (LIDEX) 0.05 % external solution, Apply 1 application topically 2 (two) times daily., Disp: , Rfl: 2 .  fluticasone (FLONASE) 50 MCG/ACT nasal spray, Place 2 sprays into both nostrils daily., Disp: 16 g, Rfl: 3 .  FOLIC ACID PO, Take 2 tablets by mouth daily. , Disp: , Rfl:  .  furosemide (LASIX) 40 MG tablet, Take 2 tablets (80 mg total) by mouth 2 (two) times daily., Disp: 120 tablet, Rfl: 5 .  hydroxychloroquine (PLAQUENIL) 200 MG  tablet, Take 200 mg by mouth 2 (two) times daily. , Disp: , Rfl:  .  levothyroxine (SYNTHROID, LEVOTHROID) 137 MCG tablet, Take 1 tablet by mouth daily before breakfast., Disp: , Rfl: 5 .  loperamide (IMODIUM) 2 MG capsule, Take 2 mg by mouth as needed for diarrhea or loose stools., Disp: , Rfl:  .  methotrexate (RHEUMATREX) 2.5 MG tablet, Take 5 tablets by mouth once a week. , Disp: , Rfl: 3 .  metoprolol succinate (TOPROL-XL) 25 MG 24 hr tablet, Take 1 tablet by mouth  daily, Disp: 90 tablet, Rfl: 2 .  Multiple Vitamin (MULTIVITAMIN) tablet, Take 1 tablet by mouth daily.  , Disp: , Rfl:  .  nitroGLYCERIN (NITROSTAT) 0.4 MG SL tablet, Place 1 tablet (0.4 mg total) under the tongue every 5 (five) minutes as needed. (Patient taking differently: Place 0.4 mg under the tongue every 5 (five) minutes as needed for chest pain. ), Disp: 25 tablet, Rfl: 12 .  OVER THE COUNTER MEDICATION, Place 1 drop into both eyes daily as needed (for dry eyes. Advanced eye relief)., Disp: , Rfl:  .  pantoprazole (PROTONIX) 40 MG tablet, Take 1 tablet (40 mg total) by mouth 2 (two) times daily before a meal., Disp: 60 tablet, Rfl: 3 .  potassium chloride SA (KLOR-CON M20) 20 MEQ tablet, Take 2 tablets (40 mEq total) by mouth 3 (three) times daily., Disp: 60 tablet, Rfl: 11 .  predniSONE (DELTASONE) 5 MG tablet, Take 5 mg by mouth daily with breakfast., Disp: , Rfl:  .  benzonatate (TESSALON) 100 MG capsule, Take 1 capsule (100 mg total) by mouth 3 (three) times daily as needed for cough. (Patient not taking: Reported on 08/07/2015), Disp: 20 capsule, Rfl: 0 .  fluticasone (FLOVENT HFA) 110 MCG/ACT inhaler, Inhale 2 puffs into the lungs 2 (two) times daily. (Patient not taking: Reported on 08/07/2015), Disp: 1 Inhaler, Rfl: 12  Data:  PFTs (10/26/12) FVC 2.27 (89%) FEV1 1.89 (98%) TLC 86% DLCO 97% No bronchodilator response. Normal spirometry, normal lung volumes, normal diffusion.  PFTs [07/01/15] FVC 1.41 (57%) FEV1  1.14 (61%) F/F 81 TLC 60% DLCO 60% Severe restriction, moderate reduction in diffusion capacity, no bronchodilator response  Echo (12/19/14) - Left ventricle: The cavity size was normal. Wall thickness was normal. Systolic function was normal. The estimated ejection fraction was in the range of 60% to 65%. Wall motion was normal; there were no regional wall motion abnormalities. - Left atrium: The atrium was mildly dilated. - Right atrium: The atrium was mildly dilated. - Pulmonary arteries: Systolic pressure was moderately increased. PA peak pressure: 42 mm Hg (S).  CT scan 07/07/15 Images reviewed. No interstitial lung disease. Bronchiectasis, bronchial wall thickening   Review of Systems  Complains of dyspnea on exertion and rest, wheezing Nonproductive cough, Denies any sputum production, hemoptysis. Has a regular heartbeats and palpitations. Denies any chest pain. Has difficulty swallowing, choking on food. Has hand, feet swelling and rash. Denies any nausea, vomiting, diarrhea, constipation. All other review of systems are negative.    Objective:   Physical Exam Blood pressure 116/64, pulse 75, height 4\' 11"  (1.499 m), weight 209 lb (94.802 kg), SpO2 98 %.  Gen.: Pleasant elderly female. No apparent distress Neuro: No gross focal deficits. Neck: No JVD, lymphadenopathy, thyromegaly. RS: Clear. No wheeze or crackles.  CVS: S1-S2 heard, no murmurs rubs gallops. Abdomen: Soft, positive bowel sounds. Extremities: No edema.    Assessment & Plan:   #1 Chronic cough. Cough is likely due to dysphagia, esophageal dysmotility, related to dermatomyositis. She does have GERD, seasonal allergies and rhinitis that may be contributing to the problem. Symptoms have improved after she was started on flonase and protonix increased to bid. Consider GI referral for esophageal molility testing if cough gets worse.  I do not suspect reactive airway disease/asthma. She was given  albuterol PRN and Flovent last year but she has not used them as they were too expensive through her insurance. Besides she did not tolerate the albuterol because it make her jittery.  #2 Dermatomyositis. She's had increase in symptoms of dermatomyositis over the past year requiring and initiation of methotrexate and Plaquenil, she is on chronic prednisone. Her PFTs show a new reduction in lung volumes and diffusion capacity however the CT scan does not show any ILD. I suspect that the PFT changes are due to her obesity and body habitus.   #2 Moderate pulmonary hypertension. Likely related to her untreated OSA. Her PA pressures have remained stable. We'll continue to monitor.  #3 OSA This has been untreated for several years. She is intolerant of CPAP. At present she does not have any symptoms related to OSA.  Plan: - Continue flonase and protonix to 40 mg twice a day  Marshell Garfinkel MD [facility] Pager [phone] If no answer or after 3pm call: [phone] 08/07/2015, 12:20 PM

## 2015-08-07 NOTE — Patient Instructions (Signed)
Continue using the Flonase nasal spray and Protonix for acid suppression to help with the cough,  There is no evidence on the CT scan of any interstitial lung disease  Return to clinic in 6 months

## 2015-08-10 ENCOUNTER — Telehealth: Payer: Self-pay | Admitting: Pulmonary Disease

## 2015-08-10 DIAGNOSIS — B372 Candidiasis of skin and nail: Secondary | ICD-10-CM | POA: Diagnosis not present

## 2015-08-10 DIAGNOSIS — R600 Localized edema: Secondary | ICD-10-CM | POA: Diagnosis not present

## 2015-08-10 DIAGNOSIS — K760 Fatty (change of) liver, not elsewhere classified: Secondary | ICD-10-CM | POA: Diagnosis not present

## 2015-08-10 DIAGNOSIS — Z7951 Long term (current) use of inhaled steroids: Secondary | ICD-10-CM | POA: Diagnosis not present

## 2015-08-10 DIAGNOSIS — I4891 Unspecified atrial fibrillation: Secondary | ICD-10-CM | POA: Diagnosis not present

## 2015-08-10 DIAGNOSIS — R238 Other skin changes: Secondary | ICD-10-CM | POA: Diagnosis not present

## 2015-08-10 DIAGNOSIS — Z7982 Long term (current) use of aspirin: Secondary | ICD-10-CM | POA: Diagnosis not present

## 2015-08-10 DIAGNOSIS — I10 Essential (primary) hypertension: Secondary | ICD-10-CM | POA: Diagnosis not present

## 2015-08-10 DIAGNOSIS — Z4800 Encounter for change or removal of nonsurgical wound dressing: Secondary | ICD-10-CM | POA: Diagnosis not present

## 2015-08-10 MED ORDER — PANTOPRAZOLE SODIUM 40 MG PO TBEC: 40.0000 mg | DELAYED_RELEASE_TABLET | Freq: Two times a day (BID) | ORAL | Status: DC

## 2015-08-10 NOTE — Telephone Encounter (Signed)
Spoke with pt, needs 90 day rx of protonix sent [facility].  This has been sent.  Nothing further needed.

## 2015-08-13 ENCOUNTER — Inpatient Hospital Stay (HOSPITAL_COMMUNITY): Payer: Medicare Other

## 2015-08-13 ENCOUNTER — Inpatient Hospital Stay (HOSPITAL_COMMUNITY)
Admission: EM | Admit: 2015-08-13 | Discharge: 2015-08-18 | DRG: 871 | Disposition: A | Discharge status: Home Health | Payer: Medicare Other | Attending: Internal Medicine | Admitting: Internal Medicine

## 2015-08-13 ENCOUNTER — Emergency Department (HOSPITAL_COMMUNITY): Payer: Medicare Other

## 2015-08-13 ENCOUNTER — Encounter (HOSPITAL_COMMUNITY): Payer: Self-pay | Admitting: Emergency Medicine

## 2015-08-13 DIAGNOSIS — Z888 Allergy status to other drugs, medicaments and biological substances status: Secondary | ICD-10-CM | POA: Diagnosis not present

## 2015-08-13 DIAGNOSIS — Z7982 Long term (current) use of aspirin: Secondary | ICD-10-CM | POA: Diagnosis not present

## 2015-08-13 DIAGNOSIS — I2510 Atherosclerotic heart disease of native coronary artery without angina pectoris: Secondary | ICD-10-CM | POA: Diagnosis not present

## 2015-08-13 DIAGNOSIS — Z841 Family history of disorders of kidney and ureter: Secondary | ICD-10-CM

## 2015-08-13 DIAGNOSIS — N390 Urinary tract infection, site not specified: Secondary | ICD-10-CM | POA: Diagnosis present

## 2015-08-13 DIAGNOSIS — R0600 Dyspnea, unspecified: Secondary | ICD-10-CM

## 2015-08-13 DIAGNOSIS — N179 Acute kidney failure, unspecified: Secondary | ICD-10-CM | POA: Diagnosis not present

## 2015-08-13 DIAGNOSIS — E876 Hypokalemia: Secondary | ICD-10-CM | POA: Diagnosis present

## 2015-08-13 DIAGNOSIS — E785 Hyperlipidemia, unspecified: Secondary | ICD-10-CM | POA: Diagnosis not present

## 2015-08-13 DIAGNOSIS — I480 Paroxysmal atrial fibrillation: Secondary | ICD-10-CM | POA: Diagnosis present

## 2015-08-13 DIAGNOSIS — B955 Unspecified streptococcus as the cause of diseases classified elsewhere: Secondary | ICD-10-CM | POA: Diagnosis present

## 2015-08-13 DIAGNOSIS — J189 Pneumonia, unspecified organism: Secondary | ICD-10-CM | POA: Diagnosis present

## 2015-08-13 DIAGNOSIS — Z7952 Long term (current) use of systemic steroids: Secondary | ICD-10-CM

## 2015-08-13 DIAGNOSIS — Z8249 Family history of ischemic heart disease and other diseases of the circulatory system: Secondary | ICD-10-CM | POA: Diagnosis not present

## 2015-08-13 DIAGNOSIS — D696 Thrombocytopenia, unspecified: Secondary | ICD-10-CM | POA: Diagnosis present

## 2015-08-13 DIAGNOSIS — L8990 Pressure ulcer of unspecified site, unspecified stage: Secondary | ICD-10-CM | POA: Diagnosis present

## 2015-08-13 DIAGNOSIS — R05 Cough: Secondary | ICD-10-CM | POA: Diagnosis not present

## 2015-08-13 DIAGNOSIS — I5032 Chronic diastolic (congestive) heart failure: Secondary | ICD-10-CM | POA: Diagnosis not present

## 2015-08-13 DIAGNOSIS — M1990 Unspecified osteoarthritis, unspecified site: Secondary | ICD-10-CM | POA: Diagnosis present

## 2015-08-13 DIAGNOSIS — L03019 Cellulitis of unspecified finger: Secondary | ICD-10-CM | POA: Diagnosis not present

## 2015-08-13 DIAGNOSIS — L309 Dermatitis, unspecified: Secondary | ICD-10-CM | POA: Diagnosis present

## 2015-08-13 DIAGNOSIS — G4733 Obstructive sleep apnea (adult) (pediatric): Secondary | ICD-10-CM | POA: Diagnosis present

## 2015-08-13 DIAGNOSIS — Z6841 Body Mass Index (BMI) 40.0 and over, adult: Secondary | ICD-10-CM | POA: Diagnosis not present

## 2015-08-13 DIAGNOSIS — L03115 Cellulitis of right lower limb: Secondary | ICD-10-CM | POA: Diagnosis not present

## 2015-08-13 DIAGNOSIS — I10 Essential (primary) hypertension: Secondary | ICD-10-CM | POA: Diagnosis not present

## 2015-08-13 DIAGNOSIS — B369 Superficial mycosis, unspecified: Secondary | ICD-10-CM | POA: Diagnosis present

## 2015-08-13 DIAGNOSIS — Z881 Allergy status to other antibiotic agents status: Secondary | ICD-10-CM | POA: Diagnosis not present

## 2015-08-13 DIAGNOSIS — Z90710 Acquired absence of both cervix and uterus: Secondary | ICD-10-CM

## 2015-08-13 DIAGNOSIS — L0390 Cellulitis, unspecified: Secondary | ICD-10-CM | POA: Diagnosis present

## 2015-08-13 DIAGNOSIS — Z9119 Patient's noncompliance with other medical treatment and regimen: Secondary | ICD-10-CM | POA: Diagnosis not present

## 2015-08-13 DIAGNOSIS — A409 Streptococcal sepsis, unspecified: Secondary | ICD-10-CM | POA: Diagnosis not present

## 2015-08-13 DIAGNOSIS — R509 Fever, unspecified: Secondary | ICD-10-CM | POA: Diagnosis not present

## 2015-08-13 DIAGNOSIS — Z800 Family history of malignant neoplasm of digestive organs: Secondary | ICD-10-CM

## 2015-08-13 DIAGNOSIS — K219 Gastro-esophageal reflux disease without esophagitis: Secondary | ICD-10-CM | POA: Diagnosis not present

## 2015-08-13 DIAGNOSIS — M109 Gout, unspecified: Secondary | ICD-10-CM | POA: Diagnosis present

## 2015-08-13 DIAGNOSIS — R0602 Shortness of breath: Secondary | ICD-10-CM | POA: Diagnosis not present

## 2015-08-13 DIAGNOSIS — D509 Iron deficiency anemia, unspecified: Secondary | ICD-10-CM | POA: Diagnosis present

## 2015-08-13 DIAGNOSIS — S31109A Unspecified open wound of abdominal wall, unspecified quadrant without penetration into peritoneal cavity, initial encounter: Secondary | ICD-10-CM | POA: Diagnosis not present

## 2015-08-13 DIAGNOSIS — Z7901 Long term (current) use of anticoagulants: Secondary | ICD-10-CM

## 2015-08-13 DIAGNOSIS — A419 Sepsis, unspecified organism: Secondary | ICD-10-CM | POA: Diagnosis not present

## 2015-08-13 DIAGNOSIS — I5043 Acute on chronic combined systolic (congestive) and diastolic (congestive) heart failure: Secondary | ICD-10-CM

## 2015-08-13 DIAGNOSIS — R531 Weakness: Secondary | ICD-10-CM | POA: Diagnosis not present

## 2015-08-13 DIAGNOSIS — E039 Hypothyroidism, unspecified: Secondary | ICD-10-CM | POA: Diagnosis not present

## 2015-08-13 DIAGNOSIS — Z79899 Other long term (current) drug therapy: Secondary | ICD-10-CM

## 2015-08-13 DIAGNOSIS — Z862 Personal history of diseases of the blood and blood-forming organs and certain disorders involving the immune mechanism: Secondary | ICD-10-CM

## 2015-08-13 DIAGNOSIS — I890 Lymphedema, not elsewhere classified: Secondary | ICD-10-CM | POA: Diagnosis present

## 2015-08-13 DIAGNOSIS — R609 Edema, unspecified: Secondary | ICD-10-CM | POA: Diagnosis present

## 2015-08-13 DIAGNOSIS — R918 Other nonspecific abnormal finding of lung field: Secondary | ICD-10-CM | POA: Diagnosis not present

## 2015-08-13 HISTORY — DX: Unspecified cord compression: G95.20

## 2015-08-13 LAB — PROTIME-INR
INR: 1.44 (ref 0.00–1.49)
Prothrombin Time: 17.6 seconds — ABNORMAL HIGH (ref 11.6–15.2)

## 2015-08-13 LAB — URINALYSIS, ROUTINE W REFLEX MICROSCOPIC
Bilirubin Urine: NEGATIVE
Bilirubin Urine: NEGATIVE
Glucose, UA: NEGATIVE mg/dL
Glucose, UA: NEGATIVE mg/dL
Ketones, ur: NEGATIVE mg/dL
Ketones, ur: NEGATIVE mg/dL
Nitrite: NEGATIVE
Nitrite: NEGATIVE
Protein, ur: NEGATIVE mg/dL
Protein, ur: NEGATIVE mg/dL
Specific Gravity, Urine: 1.018 (ref 1.005–1.030)
Specific Gravity, Urine: 1.019 (ref 1.005–1.030)
pH: 5.5 (ref 5.0–8.0)
pH: 5.5 (ref 5.0–8.0)

## 2015-08-13 LAB — URINE MICROSCOPIC-ADD ON: RBC / HPF: NONE SEEN RBC/hpf (ref 0–5)

## 2015-08-13 LAB — CBC WITH DIFFERENTIAL/PLATELET
Basophils Absolute: 0 10*3/uL (ref 0.0–0.1)
Basophils Relative: 0 %
Eosinophils Absolute: 0 10*3/uL (ref 0.0–0.7)
Eosinophils Relative: 0 %
HCT: 30.7 % — ABNORMAL LOW (ref 36.0–46.0)
Hemoglobin: 10.1 g/dL — ABNORMAL LOW (ref 12.0–15.0)
Lymphocytes Relative: 6 %
Lymphs Abs: 0.9 10*3/uL (ref 0.7–4.0)
MCH: 33 pg (ref 26.0–34.0)
MCHC: 32.9 g/dL (ref 30.0–36.0)
MCV: 100.3 fL — ABNORMAL HIGH (ref 78.0–100.0)
Monocytes Absolute: 0.7 10*3/uL (ref 0.1–1.0)
Monocytes Relative: 5 %
Neutro Abs: 13.7 10*3/uL — ABNORMAL HIGH (ref 1.7–7.7)
Neutrophils Relative %: 89 %
Platelets: 127 10*3/uL — ABNORMAL LOW (ref 150–400)
RBC: 3.06 MIL/uL — ABNORMAL LOW (ref 3.87–5.11)
RDW: 19.9 % — ABNORMAL HIGH (ref 11.5–15.5)
WBC: 15.3 10*3/uL — ABNORMAL HIGH (ref 4.0–10.5)

## 2015-08-13 LAB — BASIC METABOLIC PANEL
Anion gap: 11 (ref 5–15)
BUN: 30 mg/dL — ABNORMAL HIGH (ref 6–20)
CO2: 22 mmol/L (ref 22–32)
Calcium: 8 mg/dL — ABNORMAL LOW (ref 8.9–10.3)
Chloride: 106 mmol/L (ref 101–111)
Creatinine, Ser: 1.52 mg/dL — ABNORMAL HIGH (ref 0.44–1.00)
GFR calc Af Amer: 39 mL/min — ABNORMAL LOW (ref >60)
GFR calc non Af Amer: 34 mL/min — ABNORMAL LOW (ref >60)
Glucose, Bld: 113 mg/dL — ABNORMAL HIGH (ref 65–99)
Potassium: 3.2 mmol/L — ABNORMAL LOW (ref 3.5–5.1)
Sodium: 139 mmol/L (ref 135–145)

## 2015-08-13 LAB — STREP PNEUMONIAE URINARY ANTIGEN: Strep Pneumo Urinary Antigen: NEGATIVE

## 2015-08-13 LAB — LACTIC ACID, PLASMA
Lactic Acid, Venous: 1.9 mmol/L (ref 0.5–2.0)
Lactic Acid, Venous: 1.6 mmol/L (ref 0.5–2.0)

## 2015-08-13 LAB — APTT: aPTT: 27 seconds (ref 24–37)

## 2015-08-13 LAB — INFLUENZA PANEL BY PCR (TYPE A & B)
H1N1 flu by pcr: NOT DETECTED
Influenza A By PCR: NEGATIVE
Influenza B By PCR: NEGATIVE

## 2015-08-13 LAB — PROCALCITONIN: Procalcitonin: 0.76 ng/mL

## 2015-08-13 LAB — MAGNESIUM: Magnesium: 1.7 mg/dL (ref 1.7–2.4)

## 2015-08-13 LAB — BRAIN NATRIURETIC PEPTIDE: B Natriuretic Peptide: 304.9 pg/mL — ABNORMAL HIGH (ref 0.0–100.0)

## 2015-08-13 MED ORDER — PIPERACILLIN-TAZOBACTAM 3.375 G IVPB
3.3750 g | Freq: Three times a day (TID) | INTRAVENOUS | Status: DC
  Administered 2015-08-13 – 2015-08-18 (×15): 3.375 g via INTRAVENOUS
  Filled 2015-08-13 (×18): qty 50

## 2015-08-13 MED ORDER — LEVOTHYROXINE SODIUM 137 MCG PO TABS
137.0000 ug | ORAL_TABLET | Freq: Every day | ORAL | Status: DC
  Administered 2015-08-14 – 2015-08-18 (×5): 137 ug via ORAL
  Filled 2015-08-13 (×5): qty 1

## 2015-08-13 MED ORDER — HYDROXYCHLOROQUINE SULFATE 200 MG PO TABS
200.0000 mg | ORAL_TABLET | Freq: Two times a day (BID) | ORAL | Status: DC
  Administered 2015-08-13 – 2015-08-18 (×11): 200 mg via ORAL
  Filled 2015-08-13 (×11): qty 1

## 2015-08-13 MED ORDER — ACETAMINOPHEN 325 MG PO TABS
650.0000 mg | ORAL_TABLET | Freq: Once | ORAL | Status: DC
  Filled 2015-08-13: qty 2

## 2015-08-13 MED ORDER — FOLIC ACID 1 MG PO TABS
1.0000 mg | ORAL_TABLET | Freq: Four times a day (QID) | ORAL | Status: DC
Start: 2015-08-13 — End: 2015-08-18
  Administered 2015-08-13 – 2015-08-18 (×20): 1 mg via ORAL
  Filled 2015-08-13 (×27): qty 1

## 2015-08-13 MED ORDER — SODIUM CHLORIDE 0.9 % IV SOLN
INTRAVENOUS | Status: AC
  Administered 2015-08-13: 15:00:00 via INTRAVENOUS

## 2015-08-13 MED ORDER — PANTOPRAZOLE SODIUM 40 MG PO TBEC
40.0000 mg | DELAYED_RELEASE_TABLET | Freq: Two times a day (BID) | ORAL | Status: DC
  Administered 2015-08-13 – 2015-08-18 (×10): 40 mg via ORAL
  Filled 2015-08-13 (×13): qty 1

## 2015-08-13 MED ORDER — SODIUM CHLORIDE 0.9% FLUSH
3.0000 mL | Freq: Two times a day (BID) | INTRAVENOUS | Status: DC
  Administered 2015-08-13 – 2015-08-17 (×3): 3 mL via INTRAVENOUS

## 2015-08-13 MED ORDER — PIPERACILLIN-TAZOBACTAM 3.375 G IVPB 30 MIN: 3.3750 g | Freq: Once | INTRAVENOUS | Status: DC

## 2015-08-13 MED ORDER — HYDROCODONE-ACETAMINOPHEN 5-325 MG PO TABS
1.0000 | ORAL_TABLET | ORAL | Status: DC | PRN
  Administered 2015-08-13: 2 via ORAL
  Administered 2015-08-16: 1 via ORAL
  Filled 2015-08-13: qty 2
  Filled 2015-08-13: qty 1

## 2015-08-13 MED ORDER — ENOXAPARIN SODIUM 60 MG/0.6ML ~~LOC~~ SOLN
0.5000 mg/kg | SUBCUTANEOUS | Status: DC
  Administered 2015-08-13 – 2015-08-17 (×5): 45 mg via SUBCUTANEOUS
  Filled 2015-08-13 (×6): qty 0.6

## 2015-08-13 MED ORDER — VANCOMYCIN HCL IN DEXTROSE 1-5 GM/200ML-% IV SOLN
1000.0000 mg | Freq: Once | INTRAVENOUS | Status: AC
  Administered 2015-08-13: 1000 mg via INTRAVENOUS
  Filled 2015-08-13: qty 200

## 2015-08-13 MED ORDER — NYSTATIN 100000 UNIT/GM EX POWD
1.0000 g | Freq: Two times a day (BID) | CUTANEOUS | Status: DC
  Administered 2015-08-13 – 2015-08-18 (×10): 1 g via TOPICAL
  Filled 2015-08-13: qty 15

## 2015-08-13 MED ORDER — FLUTICASONE PROPIONATE 50 MCG/ACT NA SUSP
2.0000 | Freq: Every day | NASAL | Status: DC
  Administered 2015-08-14 – 2015-08-18 (×5): 2 via NASAL
  Filled 2015-08-13: qty 16

## 2015-08-13 MED ORDER — PREDNISONE 5 MG PO TABS
5.0000 mg | ORAL_TABLET | Freq: Every day | ORAL | Status: DC
  Administered 2015-08-14 – 2015-08-18 (×5): 5 mg via ORAL
  Filled 2015-08-13 (×5): qty 1

## 2015-08-13 MED ORDER — LORATADINE 10 MG PO TABS
10.0000 mg | ORAL_TABLET | Freq: Every day | ORAL | Status: DC
  Administered 2015-08-13 – 2015-08-18 (×6): 10 mg via ORAL
  Filled 2015-08-13 (×6): qty 1

## 2015-08-13 MED ORDER — ACETAMINOPHEN 500 MG PO TABS
500.0000 mg | ORAL_TABLET | Freq: Four times a day (QID) | ORAL | Status: DC | PRN
  Administered 2015-08-15 – 2015-08-16 (×3): 1000 mg via ORAL
  Filled 2015-08-13 (×3): qty 2

## 2015-08-13 MED ORDER — VANCOMYCIN HCL IN DEXTROSE 1-5 GM/200ML-% IV SOLN
1000.0000 mg | INTRAVENOUS | Status: DC
  Administered 2015-08-14 – 2015-08-16 (×3): 1000 mg via INTRAVENOUS
  Filled 2015-08-13 (×3): qty 200

## 2015-08-13 MED ORDER — METOPROLOL SUCCINATE ER 25 MG PO TB24
25.0000 mg | ORAL_TABLET | Freq: Every day | ORAL | Status: DC
  Administered 2015-08-13: 25 mg via ORAL
  Filled 2015-08-13: qty 1

## 2015-08-13 MED ORDER — ASPIRIN EC 325 MG PO TBEC
325.0000 mg | DELAYED_RELEASE_TABLET | Freq: Every day | ORAL | Status: DC
  Administered 2015-08-13 – 2015-08-18 (×6): 325 mg via ORAL
  Filled 2015-08-13 (×6): qty 1

## 2015-08-13 MED ORDER — ONDANSETRON HCL 4 MG PO TABS: 4.0000 mg | ORAL_TABLET | Freq: Four times a day (QID) | ORAL | Status: DC | PRN

## 2015-08-13 MED ORDER — POTASSIUM CHLORIDE CRYS ER 20 MEQ PO TBCR
40.0000 meq | EXTENDED_RELEASE_TABLET | Freq: Every day | ORAL | Status: DC
  Administered 2015-08-13 – 2015-08-18 (×6): 40 meq via ORAL
  Filled 2015-08-13 (×6): qty 2

## 2015-08-13 MED ORDER — VITAMIN D 1000 UNITS PO TABS
2000.0000 [IU] | ORAL_TABLET | Freq: Every day | ORAL | Status: DC
  Administered 2015-08-13 – 2015-08-18 (×6): 2000 [IU] via ORAL
  Filled 2015-08-13 (×6): qty 2

## 2015-08-13 MED ORDER — ONDANSETRON HCL 4 MG/2ML IJ SOLN: 4.0000 mg | Freq: Four times a day (QID) | INTRAMUSCULAR | Status: DC | PRN

## 2015-08-13 MED ORDER — SODIUM CHLORIDE 0.9 % IV SOLN
INTRAVENOUS | Status: DC
  Administered 2015-08-13: 12:00:00 via INTRAVENOUS

## 2015-08-13 MED ORDER — POTASSIUM CHLORIDE CRYS ER 20 MEQ PO TBCR
40.0000 meq | EXTENDED_RELEASE_TABLET | Freq: Once | ORAL | Status: AC
  Administered 2015-08-13: 40 meq via ORAL
  Filled 2015-08-13: qty 2

## 2015-08-13 NOTE — Progress Notes (Addendum)
PHARMACY NOTE -  Lovenox  66 yoF admitted with weakness and fever, started on antibiotics for RLE cellulitis. Pharmacy has been consulted to dose Lovenox for VTE prophylaxis. -Pt with morbid obesity and BMI > 30  -SCr elevated, CrCl ~35 ml/min -CBC reveals Hgb 10.1g/dL and Plts 127K, appears chronic (per labs in 2015).   -Note pt also on ASA 325mg  daily -Hx Afib not on anticoagulation per cardiology secondary to fall risk  Plan: Lovenox 0.5mg /kg/day F/u renal function, CBC, s/sxs bleeding   Will sign off at this time.  Please reconsult if a change in clinical status warrants re-evaluation of dosage.  Ralene Bathe, PharmD, BCPS 08/13/2015, 12:54 PM  Pager: [phone]

## 2015-08-13 NOTE — Consult Note (Signed)
WOC wound consult note Reason for Consult: Moisture associated skin damage, IAD and ITD.  Also medical device related pressure injuries to left LE from compression hosiery.  Compressino hosiery should not be used in the presence of lymphedema and is not beneficial Wound type:See above Pressure Ulcer POA: Yes Measurement:sacral full thickness tissue loss from IAD at sacrum measures 1cm x 0.4cm x 0.2cm. Intertriginous dermatitis in the sub pannicular skin fold.  Left LE with three lesions secondary to medical device (compression hose):  Proximal:  1cm round x 0.2cm.  Medial 1cm x 1.5cm x 0.2cm.  Distal 1cm x 4cm x 0.2cm.  Wound bed:red, moist Drainage (amount, consistency, odor) scant serous Periwound:intact, erythematous (LEs are edematous) Dressing procedure/placement/frequency:  I will provide a mattress replacement with low air loss feature to reduce moisture and staff will turn and reposition and float heels (with pressure redistribution heel boots) per protocol.  I will provide our house antimicrobial textile for the wicking of moisture in the panus and our house skin care products to resolve the IAD.  Note:  Patient is incontinent of liquid stool during my assessment, increasing her risk for IAD related skin damage. [facility] nursing team will not follow, but will remain available to this patient, the nursing and medical teams.  Please re-consult if needed. Thanks, Maudie Flakes, MSN, RN, [place], Arther Abbott  Pager# [phone]

## 2015-08-13 NOTE — ED Notes (Signed)
Bed: [id number] Expected date:  Expected time:  Means of arrival:  Comments: EMS weakness 70 y/o F

## 2015-08-13 NOTE — H&P (Signed)
[facility] History and Physical  [name] [id number] DOB: 14-Feb-1946 DOA: 08/13/2015  Referring physician: ED physician, Dr. Wilson Singer PCP: [name], MD   Chief Complaint: RLE erythema and difficult with ambulation   HPI:  Patient is 70 year old female with multiple and complex medical history including dermatomyositis for which she is taking Plaquenil, Methotrexate, and low-dose prednisone, obstructive sleep apnea and known noncompliance with CPAP, chronic nonproductive cough, bilateral chronic lower extremity lymphedema receiving wound care at home, morbid obesity, presents to [facility] emergency department for evaluation of multiple concerns. Patient explains one week duration of progressive fatigue and illness, body aches and malaise, subjective fevers and chills, poor oral intake. She explains she has wound care nurse who takes care of multiple wounds including abdominal area fungal infection, lower extremity wounds and was told that her right lower extremity looked more red and swollen, warm to touch and patient noted more tenderness with wound care in the right lower extremity. Patient was thus referred to emergency department for further evaluation. Patient reports also chronic nonproductive cough and dyspnea with even minimal exertion but goes on to explain that this has been fairly chronic and at her baseline. Patient denies chest pain, no specific abdominal or urinary concerns other than noticing erythema and warmth to touch in the lower part of her abdominal area. She explains she has had fungal infection around the abdominal creases for which she has been seen by wound care nurse MWF.  In the emergency department patient noted to be hemodynamically stable, vital signs notable for T 102F, HR > 90 and < 120, initial blood pressure 107/53. Blood work notable for WBC 15 K, hg 10, plt 127, K 3.2, Cr 1.52. Source of the fever determined to be most likely cellulitis in multiple  areas but chest x-ray was also also concerning for underlying pneumonia versus atypical infection. Patient was started on vancomycin and TRH asked to admit for further evaluation and management. Telemetry bed was considered appropriate based on hemodynamic stability and no cardiac or respiratory compromise at the time of the admission. Patient determined to be appropriate for inpatient hospitalization given complexity of acute medical issues requiring further evaluation and IV medications.  Assessment and Plan: Principal Problem:   Sepsis due to cellulitis [facility]) - Please note that patient met criteria for sepsis on admission with T 102 F, WBC 15 K, source most likely cellulitis in multiple areas, please see below versus developing pneumonia  - Following order sets in place: Sepsis, pneumonia, cellulitis  - Since clear source is still not determined Will place patient on broad-spectrum antibiotics  - We'll need to follow up on lactic acid, procalcitonin, blood/urine/sputum culture  - In addition, some of the systemic concerns raised suspicion for viral illness, flu PCR and respiratory virus panel also requested - CT chest without contrast for further evaluation - Narrow antibiotics as clinically indicated   Active Problems:   Cellulitis, bilateral LE but R>L, abd wall  -  order set in place, continue broad-spectrum antibiotics for now  - Wound care team consulted for assistance     Acute kidney injury ([place]) - Appears to be prerenal in etiology  - Provide gentle hydration with normal saline and repeat BMP in the morning     LEG EDEMA, BILATERAL - Chronic, limiting physical mobility  - Wound care consultation     Morbid obesity ([place]) - BMI greater than 40     PAF (paroxysmal atrial fibrillation) (HCC) - Currently in sinus rhythm but ED  doctor reported atrial fibrillation earlier  - Monitor on telemetry for now     Thrombocytopenia (HCC) - Mild, likely reactive, CBC in the morning      CAD, NATIVE VESSEL - No chest pain at this time     Chronic anticoagulation - I have not seen anticoagulation and medical list, pharmacy to reconcile     Essential hypertension - Reasonable blood pressure on admission     Hypokalemia - Supplemented in emergency department  - Repeat BMP in the morning     History of iron deficiency anemia - No signs of bleeding, CBC in the morning   DVT prophylaxis - Lovenox SQ per pharmacy   Radiological Exams on Admission: Dg Chest 2 View  08/13/2015 No acute findings. No evidence of pneumonia. No pleural effusion. Heart size is normal. 2. Chronic peribronchial thickening and mild bronchiectasis, compatible with findings described on recent chest CT report which suggested the possibility of a chronic indolent atypical infectious process such as MAI.   Code Status: Full Family Communication: Pt and husband at bedside Disposition Plan: Admit for further evaluation    Mart Piggs [facility] [phone]   Review of Systems:  Constitutional: Negative for diaphoresis.  HENT: Negative for hearing loss, ear pain, nosebleeds, congestion, sore throat, neck pain, tinnitus and ear discharge.   Eyes: Negative for blurred vision, double vision, photophobia, pain, discharge and redness.  Respiratory: Negative for wheezing and stridor.   Cardiovascular: Negative for orthopnea, claudication Gastrointestinal: Negative for heartburn, constipation, blood in stool and melena.  Genitourinary: Negative for dysuria, urgency, frequency, hematuria and flank pain.  Musculoskeletal: Negative for myalgias, back pain, Skin: Negative for itching and rash.  Neurological: Negative for tingling, tremors, sensory change, speech change, focal weakness, loss of consciousness and headaches.  Endo/Heme/Allergies: Negative for environmental allergies and polydipsia. Does not bruise/bleed easily.  Psychiatric/Behavioral: Negative for suicidal ideas. The patient is not nervous/anxious.       Past Medical History  Diagnosis Date  . PAF (paroxysmal atrial fibrillation) (HCC)     a. CHA2DS2VASc = 5-->poor OAC candidate 2/2 falls.  . Essential hypertension   . Bilateral leg edema   . Internal hemorrhoids   . GERD (gastroesophageal reflux disease)   . Hiatal hernia   . Dermatomyositis ([place])   . Osteoarthritis   . Hypothyroidism   . Sleep apnea   . History of pancreatitis   . Chronic diastolic heart failure ([place])   . Shingles   . Gall stones     a. 08/2014 Abd U/S:  Large gallstone measuring 4.2 cm.  . Gout   . Deviated septum   . Hyperlipidemia   . Morbid obesity ([place])   . Continuous urine leakage   . CAD (coronary artery disease)     a. LHC 03/2006: EF 65%, mid LAD 40-50%, ostial D1 70-80%, normal circumflex, normal RCA;  b. Lexiscan Myoview 09/2010: No ischemia or scar, EF 75%;  c. Lex MV 5/14:  Normal, no ischemia, EF 71%  . Chronic diastolic CHF (congestive heart failure) ([place])     a. Echo 8/14:  Mild LVH, EF 55-65%, normal wall motion, Tr AI, mild MR, mod TR, PASP 45;  b. 12/2014 Echo: EF 60-65%, nl wall motion, mildly dil RA/LA, PASP 30mHg.  .Marland KitchenHistory of PFTs     a. PFTs 10/2012: normal.   . Cellulitis of right leg   . Spinal cord compression ([place] 02/21/2014    Past Surgical History  Procedure Laterality Date  . Total abdominal hysterectomy    .  Nasal septum surgery    . Back surgery  Sept 7 and May 30, 2014    Social History:  reports that she has never smoked. She has never used smokeless tobacco. She reports that she does not drink alcohol or use illicit drugs.  Allergies  Allergen Reactions  . Butoconazole Itching, Rash and Other (See Comments)    'Redness and swelling feverish ' 'worse symptoms '  . Lipitor [Atorvastatin Calcium] Other (See Comments)    'Muscle weakness'    . Keflex [Cephalexin] Diarrhea    Family History  Problem Relation Age of Onset  . Heart attack Brother     x 2  . Coronary artery disease Father   . Colon cancer  Paternal Uncle   . Kidney disease Brother   . Stroke Neg Hx   . Hypertension Brother     Medication Sig  acetaminophen (TYLENOL) 500 MG tablet Take 500-1,000 mg by mouth every 6 (six) hours prn   alendronate (FOSAMAX) 70 MG tablet Take 1 tablet by mouth once a week.  aspirin EC 325 MG tablet Take 325 mg by mouth daily.  furosemide (LASIX) 40 MG tablet Take 2 tablets (80 mg total) by mouth 2 (two) times daily.  hydroxychloroquine (PLAQUENIL) 200 MG tablet Take 200 mg by mouth 2 (two) times daily.   levothyroxine  137 MCG tablet Take 1 tablet by mouth daily before breakfast.  methotrexate (RHEUMATREX) 2.5 MG tablet Take 5 tablets by mouth once a week.   metoprolol succinate 25 MG 24 hr tablet Take 1 tablet by mouth  daily  pantoprazole (PROTONIX) 40 MG tablet Take 1 tablet  2 (two) times daily before a meal.  potassium chloride SA 20 MEQ tablet Take 2 tablets  3 (three) times daily.  predniSONE (DELTASONE) 5 MG tablet Take 5 mg by mouth daily with breakfast.   Physical Exam: Filed Vitals:   08/13/15 0919 08/13/15 1145 08/13/15 1200  BP: 120/51 107/53   Pulse: 104 93   Temp: 102.1 F (38.9 C)  99.7 F (37.6 C)  TempSrc: Oral  Oral  Resp: 20 19   SpO2: 100% 93%     Physical Exam  Constitutional: Appears Tired and chronically ill, NAD  HENT: Normocephalic. External right and left ear normal. dry mucous membranes Eyes: Conjunctivae and EOM are normal. PERRLA, no scleral icterus.  Neck: Normal ROM. Neck supple. No JVD. No tracheal deviation. No thyromegaly.  CVS: RRR, no gallops, no carotid bruit.  Pulmonary: Effort and breath sounds normal,  rhonchi scattered with diminished breath sounds at bases Abdominal: Soft. BS +,  no distension, abdomen obese, from umbilicus down to pelvic area abdominal wall erythema mild but warm to touch, scattered rash consistent with fungal infection in pannus area  Musculoskeletal: Significant bilateral lower extremity edema, erythema in right lower  extremity from ankle to inch below the knee also seen on the left lower extremity but not as red, warm to touch in both lower extremities  Lymphadenopathy: No lymphadenopathy noted, cervical, inguinal. Neuro: Alert. Normal reflexes, muscle tone coordination. No cranial nerve deficit. Skin: Skin is warm and dry. Psychiatric: Normal mood and affect. Behavior, judgment, thought content normal.   Labs on Admission:  Basic Metabolic Panel:  Recent Labs Lab 08/13/15 1019  NA 139  K 3.2*  CL 106  CO2 22  GLUCOSE 113*  BUN 30*  CREATININE 1.52*  CALCIUM 8.0*   CBC:  Recent Labs Lab 08/13/15 1019  WBC 15.3*  NEUTROABS 13.7*  HGB 10.1*  HCT 30.7*  MCV 100.3*  PLT 127*   EKG: pending   If 7PM-7AM, please contact night-coverage [email or web address] Password [facility] 08/13/2015, 12:19 PM

## 2015-08-13 NOTE — ED Provider Notes (Signed)
CSN: [id number]     Arrival date & time 08/13/15  0910 History   First MD Initiated Contact with Patient 08/13/15 [phone]     Chief Complaint  Patient presents with  . Fatigue  . Cough     (Consider location/radiation/quality/duration/timing/severity/associated sxs/prior Treatment) HPI   70yF generalized weakness and cough. Symptom onset approximately 3 days ago and persistent and progressive since then. She feels generally achy. Fever last night and this morning. Cough is nonproductive. Mild shortness of breath. Chronic lower extremity edema 1 issues to her left leg. Reports increasing pain and redness in her right leg within past couple days. She denies any acute trauma. Urine is strong smell, otherwise no urinary complaints. No diarrhea. No sick contacts.   Past Medical History  Diagnosis Date  . PAF (paroxysmal atrial fibrillation) (HCC)     a. CHA2DS2VASc = 5-->poor OAC candidate 2/2 falls.  . Essential hypertension   . Bilateral leg edema   . Internal hemorrhoids   . GERD (gastroesophageal reflux disease)   . Hiatal hernia   . Dermatomyositis ([place])   . Osteoarthritis   . Hypothyroidism   . Sleep apnea   . History of pancreatitis   . Chronic diastolic heart failure ([place])   . Shingles   . Gall stones     a. 08/2014 Abd U/S:  Large gallstone measuring 4.2 cm.  . Gout   . Deviated septum   . Hyperlipidemia   . Morbid obesity ([place])   . Continuous urine leakage   . CAD (coronary artery disease)     a. LHC 03/2006: EF 65%, mid LAD 40-50%, ostial D1 70-80%, normal circumflex, normal RCA;  b. Lexiscan Myoview 09/2010: No ischemia or scar, EF 75%;  c. Lex MV 5/14:  Normal, no ischemia, EF 71%  . Chronic diastolic CHF (congestive heart failure) ([place])     a. Echo 8/14:  Mild LVH, EF 55-65%, normal wall motion, Tr AI, mild MR, mod TR, PASP 45;  b. 12/2014 Echo: EF 60-65%, nl wall motion, mildly dil RA/LA, PASP 61mmHg.  Marland Kitchen History of PFTs     a. PFTs 10/2012: normal.   . Cellulitis of  right leg    Past Surgical History  Procedure Laterality Date  . Total abdominal hysterectomy    . Nasal septum surgery    . Back surgery  Sept 7 and May 30, 2014   Family History  Problem Relation Age of Onset  . Heart attack Brother     x 2  . Coronary artery disease Father   . Colon cancer Paternal Uncle   . Kidney disease Brother   . Stroke Neg Hx   . Hypertension Brother    Social History  Substance Use Topics  . Smoking status: Never Smoker   . Smokeless tobacco: Never Used  . Alcohol Use: No   OB History    No data available     Review of Systems  All systems reviewed and negative, other than as noted in HPI.   Allergies  Butoconazole; Lipitor; and Keflex  Home Medications   Prior to Admission medications   Medication Sig Start Date End Date Taking? Authorizing Provider  acetaminophen (TYLENOL) 500 MG tablet Take 1,000 mg by mouth every 6 (six) hours as needed for mild pain.    Historical Provider, MD  albuterol (PROVENTIL) (2.5 MG/3ML) 0.083% nebulizer solution Take 3 mLs (2.5 mg total) by nebulization every 6 (six) hours as needed for wheezing or shortness of breath. 03/31/15  Praveen Mannam, MD  alendronate (FOSAMAX) 70 MG tablet Take 1 tablet by mouth once a week. 04/01/15   Historical Provider, MD  aspirin EC 325 MG tablet Take 325 mg by mouth daily.    Historical Provider, MD  benzonatate (TESSALON) 100 MG capsule Take 1 capsule (100 mg total) by mouth 3 (three) times daily as needed for cough. Patient not taking: Reported on 08/07/2015 06/17/15   [name], MD  cetirizine (ZYRTEC) 10 MG tablet Take 10 mg by mouth daily.    Historical Provider, MD  Cholecalciferol (VITAMIN D3) 2000 UNITS TABS Take 2,000 Units by mouth daily.     Historical Provider, MD  fluocinonide (LIDEX) 0.05 % external solution Apply 1 application topically 2 (two) times daily. 11/03/14   Historical Provider, MD  fluticasone (FLONASE) 50 MCG/ACT nasal spray Place 2 sprays into both  nostrils daily. 07/01/15   Praveen Mannam, MD  fluticasone (FLOVENT HFA) 110 MCG/ACT inhaler Inhale 2 puffs into the lungs 2 (two) times daily. Patient not taking: Reported on 08/07/2015 04/03/15   Marshell Garfinkel, MD  FOLIC ACID PO Take 2 tablets by mouth daily.     Historical Provider, MD  furosemide (LASIX) 40 MG tablet Take 2 tablets (80 mg total) by mouth 2 (two) times daily. 01/02/15   Rogelia Mire, NP  hydroxychloroquine (PLAQUENIL) 200 MG tablet Take 200 mg by mouth 2 (two) times daily.  08/06/10   Historical Provider, MD  levothyroxine (SYNTHROID, LEVOTHROID) 137 MCG tablet Take 1 tablet by mouth daily before breakfast. 12/01/14   Historical Provider, MD  loperamide (IMODIUM) 2 MG capsule Take 2 mg by mouth as needed for diarrhea or loose stools.    Historical Provider, MD  methotrexate (RHEUMATREX) 2.5 MG tablet Take 5 tablets by mouth once a week.  09/17/14   Historical Provider, MD  metoprolol succinate (TOPROL-XL) 25 MG 24 hr tablet Take 1 tablet by mouth  daily 07/31/15   [name], MD  Multiple Vitamin (MULTIVITAMIN) tablet Take 1 tablet by mouth daily.      Historical Provider, MD  nitroGLYCERIN (NITROSTAT) 0.4 MG SL tablet Place 1 tablet (0.4 mg total) under the tongue every 5 (five) minutes as needed. Patient taking differently: Place 0.4 mg under the tongue every 5 (five) minutes as needed for chest pain.  08/26/13   Liliane Shi, PA-C  OVER THE COUNTER MEDICATION Place 1 drop into both eyes daily as needed (for dry eyes. Advanced eye relief).    Historical Provider, MD  pantoprazole (PROTONIX) 40 MG tablet Take 1 tablet (40 mg total) by mouth 2 (two) times daily before a meal. 08/10/15   Praveen Mannam, MD  potassium chloride SA (KLOR-CON M20) 20 MEQ tablet Take 2 tablets (40 mEq total) by mouth 3 (three) times daily. 01/02/15   Rogelia Mire, NP  predniSONE (DELTASONE) 5 MG tablet Take 5 mg by mouth daily with breakfast.    Historical Provider, MD   BP 120/51 mmHg  Pulse  104  Temp(Src) 102.1 F (38.9 C) (Oral)  Resp 20  SpO2 100% Physical Exam  Constitutional: She appears well-developed and well-nourished. No distress.  Sitting up in bed. Appears tired, but not distressed.  HENT:  Head: Normocephalic and atraumatic.  Eyes: Conjunctivae are normal. Right eye exhibits no discharge. Left eye exhibits no discharge.  Neck: Neck supple.  Cardiovascular: Normal rate, regular rhythm and normal heart sounds.  Exam reveals no gallop and no friction rub.   No murmur heard. Pulmonary/Chest: Effort normal.  No respiratory distress.  Abdominal: Soft. She exhibits no distension. There is no tenderness.  Musculoskeletal: She exhibits no edema or tenderness.  Neurological: She is alert.  Skin: Skin is warm.  Severe, symmetric lower extremity edema. Skin changes consistent with cellulitis to the right lower extremity. Some superficial excoriations to the medial aspect of the left calf but this does not appear secondarily infected. Skin changes consistent with intertrigo below the pannus.  Psychiatric: She has a normal mood and affect. Her behavior is normal. Thought content normal.  Nursing note and vitals reviewed.   ED Course  Procedures (including critical care time) Labs Review Labs Reviewed  CBC WITH DIFFERENTIAL/PLATELET - Abnormal; Notable for the following:    WBC 15.3 (*)    RBC 3.06 (*)    Hemoglobin 10.1 (*)    HCT 30.7 (*)    MCV 100.3 (*)    RDW 19.9 (*)    Platelets 127 (*)    Neutro Abs 13.7 (*)    All other components within normal limits  BRAIN NATRIURETIC PEPTIDE - Abnormal; Notable for the following:    B Natriuretic Peptide 304.9 (*)    All other components within normal limits  URINALYSIS, ROUTINE W REFLEX MICROSCOPIC (NOT AT [facility]) - Abnormal; Notable for the following:    Color, Urine AMBER (*)    APPearance CLOUDY (*)    Hgb urine dipstick TRACE (*)    Leukocytes, UA SMALL (*)    All other components within normal limits  BASIC  METABOLIC PANEL - Abnormal; Notable for the following:    Potassium 3.2 (*)    Glucose, Bld 113 (*)    BUN 30 (*)    Creatinine, Ser 1.52 (*)    Calcium 8.0 (*)    GFR calc non Af Amer 34 (*)    GFR calc Af Amer 39 (*)    All other components within normal limits  URINE MICROSCOPIC-ADD ON - Abnormal; Notable for the following:    Squamous Epithelial / LPF 6-30 (*)    Bacteria, UA RARE (*)    All other components within normal limits  PROTIME-INR - Abnormal; Notable for the following:    Prothrombin Time 17.6 (*)    All other components within normal limits  URINALYSIS, ROUTINE W REFLEX MICROSCOPIC (NOT AT [facility]) - Abnormal; Notable for the following:    Color, Urine AMBER (*)    APPearance TURBID (*)    Hgb urine dipstick MODERATE (*)    Leukocytes, UA MODERATE (*)    All other components within normal limits  BASIC METABOLIC PANEL - Abnormal; Notable for the following:    BUN 29 (*)    Creatinine, Ser 1.64 (*)    Calcium 7.5 (*)    GFR calc non Af Amer 31 (*)    GFR calc Af Amer 36 (*)    All other components within normal limits  CBC - Abnormal; Notable for the following:    RBC 2.87 (*)    Hemoglobin 9.3 (*)    HCT 29.1 (*)    MCV 101.4 (*)    RDW 20.2 (*)    Platelets 100 (*)    All other components within normal limits  URINE MICROSCOPIC-ADD ON - Abnormal; Notable for the following:    Squamous Epithelial / LPF 0-5 (*)    Bacteria, UA FEW (*)    All other components within normal limits  CBC - Abnormal; Notable for the following:    RBC 2.92 (*)  Hemoglobin 9.4 (*)    HCT 29.4 (*)    MCV 100.7 (*)    RDW 19.9 (*)    Platelets 107 (*)    All other components within normal limits  BASIC METABOLIC PANEL - Abnormal; Notable for the following:    BUN 23 (*)    Creatinine, Ser 1.23 (*)    Calcium 7.9 (*)    GFR calc non Af Amer 44 (*)    GFR calc Af Amer 51 (*)    All other components within normal limits  CBC - Abnormal; Notable for the following:    RBC 3.10  (*)    Hemoglobin 10.1 (*)    HCT 31.7 (*)    MCV 102.3 (*)    RDW 20.1 (*)    Platelets 146 (*)    All other components within normal limits  BASIC METABOLIC PANEL - Abnormal; Notable for the following:    CO2 21 (*)    Creatinine, Ser 1.08 (*)    Calcium 7.9 (*)    GFR calc non Af Amer 51 (*)    GFR calc Af Amer 59 (*)    All other components within normal limits  RESPIRATORY VIRUS PANEL  CULTURE, BLOOD (ROUTINE X 2)  CULTURE, BLOOD (ROUTINE X 2)  URINE CULTURE  C DIFFICILE QUICK SCREEN W PCR REFLEX  CULTURE, BLOOD (ROUTINE X 2)  CULTURE, EXPECTORATED SPUTUM-ASSESSMENT  GRAM STAIN  CULTURE, BLOOD (ROUTINE X 2)  LACTIC ACID, PLASMA  INFLUENZA PANEL BY PCR (TYPE A & B, H1N1)  LACTIC ACID, PLASMA  PROCALCITONIN  APTT  STREP PNEUMONIAE URINARY ANTIGEN  LEGIONELLA ANTIGEN, URINE  MAGNESIUM  TSH    Imaging Review No results found. I have personally reviewed and evaluated these images and lab results as part of my medical decision-making.   EKG Interpretation None      MDM   Final diagnoses:  Cellulitis of right lower extremity  AKI (acute kidney injury) ([place])    70yF with generalized weakness, fever. Appears to have cellulitis of RLE. Skin changes to lower abdomen as well but this is more consistent with intertrigo. She is generally he and tired. Antibiotics. Admission.  Virgel Manifold, MD 08/17/15 [phone]

## 2015-08-13 NOTE — ED Notes (Signed)
Pt also has redness on R leg and generalized abd under underwear.

## 2015-08-13 NOTE — Progress Notes (Signed)
Pharmacy Antibiotic Note  [name] is a 70 y.o. female admitted on 08/13/2015 with cellulitis.  Pharmacy has been consulted for vancomycin dosing. Erythema noted on R leg.  Checking influenza PCR for bodt aches, fever, cough. Scr elevated at admission.  Diarrhea to cephalexin  Plan:  Vancomycin 1gm IV x 1 then 1gm IV q24h for goal trough 10-18mcg/ml  Check trough if remains on vancomycin > 4 days  If non-purulent cellulitis, suggest use of cefazolin or ceftriaxone as per cellulitis focused order-set.     Temp (24hrs), Avg:102.1 F (38.9 C), Min:102.1 F (38.9 C), Max:102.1 F (38.9 C)  No results for input(s): WBC, CREATININE, LATICACIDVEN, VANCOTROUGH, VANCOPEAK, VANCORANDOM, GENTTROUGH, GENTPEAK, GENTRANDOM, TOBRATROUGH, TOBRAPEAK, TOBRARND, AMIKACINPEAK, AMIKACINTROU, AMIKACIN in the last 168 hours.  CrCl cannot be calculated (Patient has no serum creatinine result on file.).    Allergies  Allergen Reactions  . Butoconazole Itching, Rash and Other (See Comments)    'Redness and swelling feverish ' 'worse symptoms '  . Lipitor [Atorvastatin Calcium] Other (See Comments)    'Muscle weakness'    . Keflex [Cephalexin] Diarrhea    Antimicrobials this admission: Vancomycin 3/2 >>   Dose adjustments this admission:  Microbiology results: 3/2 flu PCR  Thank you for allowing pharmacy to be a part of this patient's care.  Doreene Eland, PharmD, BCPS.   Pager: [phone] 08/13/2015 10:03 AM

## 2015-08-13 NOTE — ED Notes (Signed)
Pt from home. Reports generalized weakness and body pain for the past 3 days. Pt has had a intermittent dry cough since October; has seen pulmonology recently. Pt has had a fever since last night. Fever of 101.22F with EMS. EMS gave 1000mg  acetaminophen. Lungs clear with no audible wheezing. Pt also reports strong smelling urine at home.

## 2015-08-13 NOTE — ED Notes (Signed)
MD at bedside. 

## 2015-08-14 DIAGNOSIS — N390 Urinary tract infection, site not specified: Secondary | ICD-10-CM

## 2015-08-14 DIAGNOSIS — I10 Essential (primary) hypertension: Secondary | ICD-10-CM

## 2015-08-14 DIAGNOSIS — J189 Pneumonia, unspecified organism: Secondary | ICD-10-CM

## 2015-08-14 DIAGNOSIS — D696 Thrombocytopenia, unspecified: Secondary | ICD-10-CM

## 2015-08-14 DIAGNOSIS — L0390 Cellulitis, unspecified: Secondary | ICD-10-CM

## 2015-08-14 DIAGNOSIS — L03019 Cellulitis of unspecified finger: Secondary | ICD-10-CM

## 2015-08-14 DIAGNOSIS — A419 Sepsis, unspecified organism: Principal | ICD-10-CM

## 2015-08-14 DIAGNOSIS — N179 Acute kidney failure, unspecified: Secondary | ICD-10-CM

## 2015-08-14 LAB — LEGIONELLA ANTIGEN, URINE

## 2015-08-14 LAB — RESPIRATORY VIRUS PANEL
Adenovirus: NEGATIVE
Influenza A: NEGATIVE
Influenza B: NEGATIVE
Metapneumovirus: NEGATIVE
Parainfluenza 1: NEGATIVE
Parainfluenza 2: NEGATIVE
Parainfluenza 3: NEGATIVE
Respiratory Syncytial Virus A: NEGATIVE
Respiratory Syncytial Virus B: NEGATIVE
Rhinovirus: NEGATIVE

## 2015-08-14 LAB — CBC
HCT: 29.1 % — ABNORMAL LOW (ref 36.0–46.0)
Hemoglobin: 9.3 g/dL — ABNORMAL LOW (ref 12.0–15.0)
MCH: 32.4 pg (ref 26.0–34.0)
MCHC: 32 g/dL (ref 30.0–36.0)
MCV: 101.4 fL — ABNORMAL HIGH (ref 78.0–100.0)
Platelets: 100 10*3/uL — ABNORMAL LOW (ref 150–400)
RBC: 2.87 MIL/uL — ABNORMAL LOW (ref 3.87–5.11)
RDW: 20.2 % — ABNORMAL HIGH (ref 11.5–15.5)
WBC: 8 10*3/uL (ref 4.0–10.5)

## 2015-08-14 LAB — BASIC METABOLIC PANEL
Anion gap: 8 (ref 5–15)
BUN: 29 mg/dL — ABNORMAL HIGH (ref 6–20)
CO2: 22 mmol/L (ref 22–32)
Calcium: 7.5 mg/dL — ABNORMAL LOW (ref 8.9–10.3)
Chloride: 106 mmol/L (ref 101–111)
Creatinine, Ser: 1.64 mg/dL — ABNORMAL HIGH (ref 0.44–1.00)
GFR calc Af Amer: 36 mL/min — ABNORMAL LOW (ref >60)
GFR calc non Af Amer: 31 mL/min — ABNORMAL LOW (ref >60)
Glucose, Bld: 74 mg/dL (ref 65–99)
Potassium: 3.8 mmol/L (ref 3.5–5.1)
Sodium: 136 mmol/L (ref 135–145)

## 2015-08-14 LAB — TSH: TSH: 2.81 u[IU]/mL (ref 0.350–4.500)

## 2015-08-14 MED ORDER — GUAIFENESIN ER 600 MG PO TB12
1200.0000 mg | ORAL_TABLET | Freq: Two times a day (BID) | ORAL | Status: DC
  Administered 2015-08-14 – 2015-08-18 (×9): 1200 mg via ORAL
  Filled 2015-08-14 (×10): qty 2

## 2015-08-14 NOTE — Progress Notes (Signed)
Patient Demographics  [name], is a 70 y.o. female, DOB - April 12, 1946, [id number]  Admit date - 08/13/2015   Admitting Physician Theodis Blaze, MD  Outpatient Primary MD for the patient is [name], MD  LOS - 1   Chief Complaint  Patient presents with  . Fatigue  . Cough       Admission HPI/Brief narrative: 70 year old female with history of dermatomyositis for which she is taking Plaquenil, Methotrexate, and low-dose prednisone, obstructive sleep apnea noncompliant with CPAP, chronic nonproductive cough, bilateral chronic lower extremity lymphedema receiving wound care at home, morbid obesity, presents to [facility] with lower ext cellulits  Subjective:   [name] today has, No headache, No chest pain, No abdominal pain - No Nausea, report generalized weakness and fatigue . Assessment & Plan    Principal Problem:   Sepsis due to cellulitis [facility]) Active Problems:   CAD, NATIVE VESSEL   LEG EDEMA, BILATERAL   Morbid obesity (HCC)   Chronic anticoagulation   PAF (paroxysmal atrial fibrillation) (HCC)   Essential hypertension   Cellulitis, bilateral LE but R>L, abd wall    Acute kidney injury ([place])   Hypokalemia   History of iron deficiency anemia   Thrombocytopenia (HCC)   Sepsis ([place])  Sepsis due to cellulitis /PNA - admitted  with sepsis as fever off 102 F, WBC 15 K on admission .source most likely cellulitis in multiple areas, UTI, and pneumonia. - Following order sets in place: Sepsis, pneumonia, cellulitis  - llow up on lactic acid, procalcitonin, blood/urine/sputum culture  - Influenza negative   Cellulitis, bilateral LE but R>L, abd wall  - order set in place, continue broad-spectrum antibiotics for now  - Wound care team consulted for assistance   CAP - Continue with broad-spectrum antibiotic, CT chest with evidence of left lower lobe pneumonia. -  Follow blood culture and sputum culture - Legionella antigen negative, strep pneumonia antigen negative  UTI - Continue with broad-spectrum antibiotics, follow on urine culture   Acute kidney injury ([place]) - Appears to be prerenal in etiology , and tinea to monitor on gentle hydration   LEG EDEMA, BILATERAL - Chronic, limiting physical mobility  - Wound care consultation    Morbid obesity ([place]) - BMI greater than 40    PAF (paroxysmal atrial fibrillation) ([place]) - Followed By Dr. Rayann Heman, currently not a candidate for anticoagulation secondary to falls.   Thrombocytopenia (HCC) - Mild, likely reactive, monitor   CAD, NATIVE VESSEL - No chest pain at this time    Essential hypertension - Reasonable blood pressure on admission    Hypokalemia - Repleted - Repeat BMP in the morning    History of iron deficiency anemia - No signs of bleeding, CBC in the morning  Fungal rash and abdominal folds - Continue with nystatin powder  Code Status: full  Family Communication: None at bedside  Disposition Plan: Pending PT consult   Procedures  None   Consults   None   Medications  Scheduled Meds: . aspirin EC  325 mg Oral Daily  . cholecalciferol  2,000 Units Oral Daily  . enoxaparin (LOVENOX) injection  0.5 mg/kg Subcutaneous Q24H  . fluticasone  2 spray Each Nare Daily  . folic acid  1  mg Oral QID  . hydroxychloroquine  200 mg Oral BID  . levothyroxine  137 mcg Oral QAC breakfast  . loratadine  10 mg Oral Daily  . nystatin  1 g Topical BID  . pantoprazole  40 mg Oral BID AC  . piperacillin-tazobactam (ZOSYN)  IV  3.375 g Intravenous 3 times per day  . potassium chloride SA  40 mEq Oral Daily  . predniSONE  5 mg Oral Q breakfast  . sodium chloride flush  3 mL Intravenous Q12H  . vancomycin  1,000 mg Intravenous Q24H   Continuous Infusions:  PRN Meds:.acetaminophen, HYDROcodone-acetaminophen, ondansetron **OR** ondansetron (ZOFRAN) IV  DVT Prophylaxis   Lovenox   Lab Results  Component Value Date   PLT 100* 08/14/2015    Antibiotics    Anti-infectives    Start     Dose/Rate Route Frequency Ordered Stop   08/14/15 0600  vancomycin (VANCOCIN) IVPB 1000 mg/200 mL premix     1,000 mg 200 mL/hr over 60 Minutes Intravenous Every 24 hours 08/13/15 1101     08/13/15 1400  hydroxychloroquine (PLAQUENIL) tablet 200 mg     200 mg Oral 2 times daily 08/13/15 1254     08/13/15 1400  piperacillin-tazobactam (ZOSYN) IVPB 3.375 g     3.375 g 12.5 mL/hr over 240 Minutes Intravenous 3 times per day 08/13/15 1335     08/13/15 1330  piperacillin-tazobactam (ZOSYN) IVPB 3.375 g  Status:  Discontinued     3.375 g 100 mL/hr over 30 Minutes Intravenous  Once 08/13/15 1327 08/13/15 1329   08/13/15 1330  vancomycin (VANCOCIN) IVPB 1000 mg/200 mL premix     1,000 mg 200 mL/hr over 60 Minutes Intravenous  Once 08/13/15 1327 08/13/15 1620   08/13/15 1015  vancomycin (VANCOCIN) IVPB 1000 mg/200 mL premix     1,000 mg 200 mL/hr over 60 Minutes Intravenous  Once 08/13/15 1001 08/13/15 1254          Objective:   Filed Vitals:   08/13/15 2254 08/14/15 0124 08/14/15 0428 08/14/15 0952  BP: 92/41 108/35 97/40 98/43   Pulse: 82  84 81  Temp: 98.1 F (36.7 C)  97.9 F (36.6 C) 98.4 F (36.9 C)  TempSrc: Oral  Oral Oral  Resp: 18  20 18   Height:      Weight:   95.709 kg (211 lb)   SpO2: 96%  100% 100%    Wt Readings from Last 3 Encounters:  08/14/15 95.709 kg (211 lb)  08/07/15 94.802 kg (209 lb)  07/01/15 101.606 kg (224 lb)     Intake/Output Summary (Last 24 hours) at 08/14/15 1320 Last data filed at 08/14/15 0847  Gross per 24 hour  Intake    830 ml  Output      0 ml  Net    830 ml     Physical Exam  Awake Alert, Oriented X 3, [place].AT,PERRAL Supple Neck,No JVD, No cervical lymphadenopathy appriciated.  Symmetrical Chest wall movement, Good air movement bilaterally, CTAB RRR,No Gallops,Rubs or new Murmurs, No Parasternal Heave +ve  B.Sounds, Abd Soft, No tenderness, obese , fungal rash and abdominal folds  No Cyanosis, significant bilateral lower extremity lymphedema.   Data Review   Micro Results Recent Results (from the past 240 hour(s))  Culture, blood (x 2)     Status: None (Preliminary result)   Collection Time: 08/13/15 10:18 AM  Result Value Ref Range Status   Specimen Description BLOOD LEFT WRIST  Final   Special Requests BOTTLES DRAWN AEROBIC  AND ANAEROBIC  5ML  Final   Culture  Setup Time   Final    GRAM POSITIVE COCCI IN CHAINS IN BOTH AEROBIC AND ANAEROBIC BOTTLES CRITICAL RESULT CALLED TO, READ BACK BY AND VERIFIED WITH: J ASARO,RN @0550  08/14/15 MKELLY    Culture   Final    TOO YOUNG TO READ Performed at [facility]    Report Status PENDING  Incomplete  Culture, blood (x 2)     Status: None (Preliminary result)   Collection Time: 08/13/15  3:49 PM  Result Value Ref Range Status   Specimen Description BLOOD LEFT HAND  Final   Special Requests IN PEDIATRIC BOTTLE  0.5CC  Final   Culture   Final    NO GROWTH < 24 HOURS Performed at [facility]    Report Status PENDING  Incomplete  Urine culture     Status: None (Preliminary result)   Collection Time: 08/13/15  7:25 PM  Result Value Ref Range Status   Specimen Description URINE, CLEAN CATCH  Final   Special Requests NONE  Final   Culture   Final    TOO YOUNG TO READ Performed at [facility]    Report Status PENDING  Incomplete    Radiology Reports Dg Chest 2 View  08/13/2015  CLINICAL DATA:  New cough, congestion, shortness of breath and fever since Sunday. Increased weakness. EXAM: CHEST  2 VIEW COMPARISON:  Chest x-rays dated 06/12/2015 and 03/13/2015. Comparison also made to chest CT dated 07/07/2015. FINDINGS: Heart size is within normal limits. Overall cardiomediastinal silhouette is normal in size and configuration. Peribronchial thickening and mild bronchiectasis appears stable, compatible with findings on  recent chest CT. Lungs otherwise clear. No confluent opacity to suggest a consolidating pneumonia. No pleural effusion. No pneumothorax. Degenerative changes are again noted throughout the kyphotic thoracolumbar spine. Stable compression fracture deformities are seen at the thoracolumbar junction status post earlier vertebroplasties. No acute osseous abnormality seen. IMPRESSION: 1. No acute findings. No evidence of pneumonia. No pleural effusion. Heart size is normal. 2. Chronic peribronchial thickening and mild bronchiectasis, compatible with findings described on recent chest CT report which suggested the possibility of a chronic indolent atypical infectious process such as MAI. Electronically Signed   By: Franki Cabot M.D.   On: 08/13/2015 10:55   Ct Chest Wo Contrast  08/13/2015  CLINICAL DATA:  Cough and weakness EXAM: CT CHEST WITHOUT CONTRAST TECHNIQUE: Multidetector CT imaging of the chest was performed following the standard protocol without IV contrast. COMPARISON:  Plain film from earlier in the same day, 07/07/2015. FINDINGS: Lungs are well aerated bilaterally. Some patchy changes are noted in the left lower lobe new from the prior exam consistent with acute infiltrate. A few small subpleural nodules are noted stable from the previous exam. The thoracic inlet is within normal limits. No hilar or mediastinal adenopathy is noted. Mild aortic calcifications are seen as well as coronary calcifications stable from the previous exam. The visualized upper abdomen demonstrates a large central gallstone measuring approximately 4 cm in greatest dimension. No other focal abnormality is seen. Changes of prior vertebral augmentation are noted at T11 and T12. IMPRESSION: New left lower lobe mild infiltrate. Stable subpleural nodules. If the patient is at high risk for bronchogenic carcinoma, follow-up chest CT at 1 year is recommended. If the patient is at low risk, no follow-up is needed. This recommendation  follows the consensus statement: Guidelines for Management of Small Pulmonary Nodules Detected on CT Scans: A  Statement from the [place] as published in Radiology 2005; 237:395-400. No other focal abnormality is noted. Electronically Signed   By: Inez Catalina M.D.   On: 08/13/2015 14:36     CBC  Recent Labs Lab 08/13/15 1019 08/14/15 0541  WBC 15.3* 8.0  HGB 10.1* 9.3*  HCT 30.7* 29.1*  PLT 127* 100*  MCV 100.3* 101.4*  MCH 33.0 32.4  MCHC 32.9 32.0  RDW 19.9* 20.2*  LYMPHSABS 0.9  --   MONOABS 0.7  --   EOSABS 0.0  --   BASOSABS 0.0  --     Chemistries   Recent Labs Lab 08/13/15 1019 08/13/15 1549 08/14/15 0541  NA 139  --  136  K 3.2*  --  3.8  CL 106  --  106  CO2 22  --  22  GLUCOSE 113*  --  74  BUN 30*  --  29*  CREATININE 1.52*  --  1.64*  CALCIUM 8.0*  --  7.5*  MG  --  1.7  --    ------------------------------------------------------------------------------------------------------------------ estimated creatinine clearance is 32.8 mL/min (by C-G formula based on Cr of 1.64). ------------------------------------------------------------------------------------------------------------------ No results for input(s): HGBA1C in the last 72 hours. ------------------------------------------------------------------------------------------------------------------ No results for input(s): CHOL, HDL, LDLCALC, TRIG, CHOLHDL, LDLDIRECT in the last 72 hours. ------------------------------------------------------------------------------------------------------------------  Recent Labs  08/14/15 0546  TSH 2.810   ------------------------------------------------------------------------------------------------------------------ No results for input(s): VITAMINB12, FOLATE, FERRITIN, TIBC, IRON, RETICCTPCT in the last 72 hours.  Coagulation profile  Recent Labs Lab 08/13/15 1549  INR 1.44    No results for input(s): DDIMER in the last 72  hours.  Cardiac Enzymes No results for input(s): CKMB, TROPONINI, MYOGLOBIN in the last 168 hours.  Invalid input(s): CK ------------------------------------------------------------------------------------------------------------------ Invalid input(s): POCBNP     Time Spent in minutes   30 minutes   Geffrey Michaelsen M.D on 08/14/2015 at 1:20 PM  Between 7am to 7pm - Pager - [phone]  After 7pm go to [email or web address] - password [facility]  [facility]   Office  [phone]

## 2015-08-14 NOTE — Care Management Note (Signed)
Case Management Note  Patient Details  Name: [name] MRN: [id number] Date of Birth: 11/27/1945  Subjective/Objective:   70 y/o f admitted w/cellulitis. From home.PT- HHC.                 Action/Plan:d/c home w/HHC.   Expected Discharge Date:   (unknown)               Expected Discharge Plan:  [name]  In-House Referral:     Discharge planning Services  CM Consult  Post Acute Care Choice:    Choice offered to:     DME Arranged:    DME Agency:     HH Arranged:    HH Agency:     Status of Service:  In process, will continue to follow  Medicare Important Message Given:    Date Medicare IM Given:    Medicare IM give by:    Date Additional Medicare IM Given:    Additional Medicare Important Message give by:     If discussed at [place] of Stay Meetings, dates discussed:    Additional Comments:  Dessa Phi, RN 08/14/2015, 3:07 PM

## 2015-08-14 NOTE — Progress Notes (Signed)
PA on Call, L. Harduck, notified of pt's soft BP. Pt asymptomatic. No new orders given. Will continue to monitor closely. [name] I

## 2015-08-14 NOTE — Progress Notes (Signed)
CRITICAL VALUE ALERT  Critical value received:  Blood cultures showed gram + cocci in chains in both aerobic and anaerobic bottles  Date of notification:  08/14/2015  Time of notification:  0555  Critical value read back:Yes.    Nurse who received alert:  Carnella [place] I   MD notified (1st page):  L. Harduck, PA  Time of first page:  0556  Responding MD:  Ardis Rowan, PA  Time MD responded:  [phone]

## 2015-08-14 NOTE — Consult Note (Addendum)
WOC consult requested for bilat legs.  Consult was performed on 3/2; refer to previous progress notes for assessment.  Re-consult requested since legs have begun weeping mod amt yellow drainage today.  Plan: Topical treatment orders provided for nurse to change dressings to weeping legs Q days as follows: Apply xeroform gauze over affected areas, then cover with ADB pads to absorb drainage, then wrap with kerlex, beginning from just behind toes to below ankles in a spiral fashion.  Apply Ace wrap over the kerlex in the same fashion for light compression.  Discussed plan of care with bedside nurse via phone. Please re-consult if further assistance is needed.  Thank-you,  Julien Girt MSN, [place], [place], [place], [place]

## 2015-08-14 NOTE — Evaluation (Signed)
Physical Therapy Evaluation Patient Details Name: [name] MRN: [id number] DOB: May 31, 1946 Today's Date: 08/14/2015   History of Present Illness  70 year old female with PMH dermatomyositis, obstructive sleep apnea and known noncompliance with CPAP, chronic nonproductive cough, bilateral chronic lower extremity lymphedema receiving wound care at home, morbid obesity, presents to [facility] emergency department for evaluation of one week duration of progressive fatigue and illness, body aches and malaise, subjective fevers and chills, poor oral intake; RLE and abdominal erythema; weakness and difficulty with ambulation.  Clinical Impression  On eval, pt required Mod assist +2 for mobility. Pt sat EOB for at least 7-8 minutes. Pt reported pain in L LE and sacral wound with activity. Will continue to follow and progress activity as able.      Follow Up Recommendations Home health PT;Supervision/Assistance - 24 hour(depending on progress)    Equipment Recommendations  None recommended by PT    Recommendations for Other Services       Precautions / Restrictions Precautions Precautions: Fall Precaution Comments: wound on bottom Restrictions Weight Bearing Restrictions: No      Mobility  Bed Mobility Overal bed mobility: Needs Assistance Bed Mobility: Rolling;Supine to Sit;Sit to Supine Rolling: Mod assist   Supine to sit: Mod assist;+2 for physical assistance;+2 for safety/equipment Sit to supine: +2 for physical assistance;+2 for safety/equipment   General bed mobility comments: Assist for trunk and bil LEs. Utilized bedpad for scooting, positioning. Increased time.   Transfers                 General transfer comment: NT on today  Ambulation/Gait                Stairs            Wheelchair Mobility    Modified Rankin (Stroke Patients Only)       Balance Overall balance assessment: Needs assistance Sitting-balance support: Bilateral upper  extremity supported;Feet supported Sitting balance-Leahy Scale: Good Sitting balance - Comments: Sat EOB ~7-8 minutes.                                      Pertinent Vitals/Pain Pain Assessment: Faces Pain Score: 3  Faces Pain Scale: Hurts even more Pain Location: sacral wound and L LE Pain Descriptors / Indicators: Grimacing;Moaning Pain Intervention(s): Limited activity within patient's tolerance;Repositioned    Home Living Family/patient expects to be discharged to:: Private residence Living Arrangements: Spouse/significant other Available Help at Discharge: Family;Available 24 hours/day Type of Home: House Home Access: Ramped entrance     Home Layout: One level Home Equipment: Walker - 2 wheels;Walker - 4 wheels;Wheelchair - Education administrator (comment);Bedside commode;Toilet riser;Shower seat;Hand held shower head Additional Comments: they tried a tub bench and it did not work     Prior Function Level of Independence: Needs assistance   Gait / Transfers Assistance Needed: at times uses walker/wheelchair for mobility  ADL's / Homemaking Assistance Needed: husband assists with bathing and dressing, tub transfer        Hand Dominance   Dominant Hand: Right    Extremity/Trunk Assessment   Upper Extremity Assessment: Defer to OT evaluation           Lower Extremity Assessment: Generalized weakness;LLE deficits/detail   LLE Deficits / Details: dressing in place on left lower leg  Cervical / Trunk Assessment: Kyphotic  Communication   Communication: No difficulties  Cognition Arousal/Alertness: Awake/alert Behavior During Therapy:  WFL for tasks assessed/performed Overall Cognitive Status: Within Functional Limits for tasks assessed                      General Comments General comments (skin integrity, edema, etc.): wounds BLE, sacrum, edema BLE    Exercises General Exercises - Lower Extremity Long Arc Quad: AROM;Both;5 reps;Seated       Assessment/Plan    PT Assessment Patient needs continued PT services  PT Diagnosis Difficulty walking;Acute pain;Generalized weakness   PT Problem List Decreased strength;Decreased range of motion;Decreased activity tolerance;Decreased balance;Decreased mobility;Obesity;Pain  PT Treatment Interventions DME instruction;Gait training;Functional mobility training;Therapeutic activities;Therapeutic exercise;Balance training;Patient/family education   PT Goals (Current goals can be found in the Care Plan section) Acute Rehab PT Goals Patient Stated Goal: none stated PT Goal Formulation: With patient/family Time For Goal Achievement: 08/28/15 Potential to Achieve Goals: Fair    Frequency Min 3X/week   Barriers to discharge        Co-evaluation PT/OT/SLP Co-Evaluation/Treatment: Yes Reason for Co-Treatment: For patient/therapist safety PT goals addressed during session: Mobility/safety with mobility         End of Session   Activity Tolerance: Patient limited by fatigue;Patient limited by pain Patient left: in bed;with call bell/phone within reach;with bed alarm set           Time: [id number] PT Time Calculation (min) (ACUTE ONLY): 26 min   Charges:   PT Evaluation $PT Eval Moderate Complexity: 1 Procedure     PT G Codes:        Weston Anna, MPT Pager: [phone]

## 2015-08-14 NOTE — Progress Notes (Signed)
Occupational Therapy Evaluation Patient Details Name: [name] MRN: [id number] DOB: 1946-06-09 Today's Date: 08/14/2015    History of Present Illness 70 year old female with PMH dermatomyositis, obstructive sleep apnea and known noncompliance with CPAP, chronic nonproductive cough, bilateral chronic lower extremity lymphedema receiving wound care at home, morbid obesity, presents to [facility] emergency department for evaluation of one week duration of progressive fatigue and illness, body aches and malaise, subjective fevers and chills, poor oral intake; RLE and abdominal erythema; weakness and difficulty with ambulation.   Clinical Impression   Patient presents to OT with decreased ADL independence and safety due to the deficits listed below. She will benefit from skilled OT to maximize function and facilitate a safe discharge. OT will follow.    Follow Up Recommendations  Home health OT;Supervision/Assistance - 24 hour    Equipment Recommendations  None recommended by OT    Recommendations for Other Services       Precautions / Restrictions Precautions Precautions: Fall;Other (comment) (wounds) Restrictions Weight Bearing Restrictions: No      Mobility Bed Mobility Overal bed mobility: Needs Assistance;+2 for physical assistance;+ 2 for safety/equipment Bed Mobility: Supine to Sit;Sit to Supine;Rolling Rolling: Mod assist   Supine to sit: Mod assist;+2 for physical assistance;+2 for safety/equipment Sit to supine: Mod assist;+2 for physical assistance;+2 for safety/equipment   General bed mobility comments: used pad to assist with scooting patient.  Transfers                      Balance                                            ADL Overall ADL's : Needs assistance/impaired Eating/Feeding: Set up;Bed level   Grooming: Minimal assistance;Bed level Grooming Details (indicate cue type and reason): has difficulty with hair care due to  decreased shoulder ROM Upper Body Bathing: Moderate assistance;Bed level   Lower Body Bathing: Total assistance;Bed level   Upper Body Dressing : Minimal assistance;Sitting   Lower Body Dressing: Total assistance               Functional mobility during ADLs: Moderate assistance;+2 for physical assistance;+2 for safety/equipment (supine <>sit only) General ADL Comments: Patient apprehensive about PT/OT at this time. Agreeable to minimal activity. Supine to sit, sat EOB, back to supine and then to L sidelying at end of session. Patient limited by sacral and LLE pain due to wounds. OT will follow     Vision     Perception     Praxis      Pertinent Vitals/Pain Pain Assessment: 0-10 Pain Score: 3  Pain Location: sacral wound and LLE Pain Descriptors / Indicators: Aching;Throbbing Pain Intervention(s): Limited activity within patient's tolerance;Monitored during session;Repositioned     Hand Dominance Right   Extremity/Trunk Assessment Upper Extremity Assessment Upper Extremity Assessment: Generalized weakness (AROM B shoulders limited to 90 degrees flexion/diff with ER)   Lower Extremity Assessment Lower Extremity Assessment: Defer to PT evaluation       Communication Communication Communication: No difficulties   Cognition Arousal/Alertness: Awake/alert Behavior During Therapy: WFL for tasks assessed/performed Overall Cognitive Status: Within Functional Limits for tasks assessed                     General Comments       Exercises       Shoulder  Instructions      Home Living Family/patient expects to be discharged to:: Private residence Living Arrangements: Spouse/significant other Available Help at Discharge: Family;Available 24 hours/day Type of Home: House Home Access: Ramped entrance     Home Layout: One level     Bathroom Shower/Tub: Tub/shower unit Shower/tub characteristics: Curtain Biochemist, clinical: Standard Bathroom  Accessibility: Yes   Home Equipment: Environmental consultant - 2 wheels;Walker - 4 wheels;Wheelchair - Education administrator (comment);Bedside commode;Toilet riser;Shower seat;Hand held shower head (lift chair)   Additional Comments: they tried a tub bench and it did not work       Prior Functioning/Environment Level of Independence: Needs assistance  Gait / Transfers Assistance Needed: at times uses walker/wheelchair for mobility ADL's / Homemaking Assistance Needed: husband assists with bathing and dressing, tub transfer        OT Diagnosis: Generalized weakness;Acute pain   OT Problem List: Decreased strength;Decreased activity tolerance;Decreased safety awareness;Pain;Obesity;Impaired UE functional use   OT Treatment/Interventions: Self-care/ADL training;Therapeutic exercise;DME and/or AE instruction;Therapeutic activities;Patient/family education    OT Goals(Current goals can be found in the care plan section) Acute Rehab OT Goals Patient Stated Goal: none stated OT Goal Formulation: With patient Time For Goal Achievement: 08/28/15 Potential to Achieve Goals: Good ADL Goals Pt Will Perform Upper Body Bathing: with supervision;sitting Pt Will Perform Lower Body Bathing: with min assist;sit to/from stand Pt Will Perform Upper Body Dressing: with supervision;sitting Pt Will Perform Lower Body Dressing: with min assist;sit to/from stand Pt Will Transfer to Toilet: with supervision;ambulating;regular height toilet Pt Will Perform Toileting - Clothing Manipulation and hygiene: with min assist;sit to/from stand  OT Frequency: Min 2X/week   Barriers to D/C:            Co-evaluation              End of Session Nurse Communication: Mobility status  Activity Tolerance: Patient limited by pain Patient left: in bed;with call bell/phone within reach;with bed alarm set;with family/visitor present   Time: [id number] OT Time Calculation (min): 27 min Charges:  OT General Charges $OT Visit: 1  Procedure OT Evaluation $OT Eval Moderate Complexity: 1 Procedure G-Codes:    Ayush Boulet A 2015-08-18, 12:44 PM

## 2015-08-14 NOTE — Care Management Note (Signed)
Case Management Note  Patient Details  Name: [name] MRN: [id number] Date of Birth: 1946/05/08  Subjective/Objective:    Patient already active w/[facility] HHRN/HHPT. MD if agree please resume [place] orders.                Action/Plan:d/c home w/HHRN/HHPT.   Expected Discharge Date:   (unknown)               Expected Discharge Plan:  [place]  In-House Referral:     Discharge planning Services  CM Consult  Post Acute Care Choice:  Home Health (HHRN/PT-Active w/[facility]) Choice offered to:     DME Arranged:    DME Agency:     HH Arranged:    HH Agency:     Status of Service:  In process, will continue to follow  Medicare Important Message Given:    Date Medicare IM Given:    Medicare IM give by:    Date Additional Medicare IM Given:    Additional Medicare Important Message give by:     If discussed at [place] of Stay Meetings, dates discussed:    Additional Comments:  Dessa Phi, RN 08/14/2015, 4:08 PM

## 2015-08-15 DIAGNOSIS — A409 Streptococcal sepsis, unspecified: Secondary | ICD-10-CM

## 2015-08-15 DIAGNOSIS — I2510 Atherosclerotic heart disease of native coronary artery without angina pectoris: Secondary | ICD-10-CM

## 2015-08-15 LAB — BASIC METABOLIC PANEL
Anion gap: 6 (ref 5–15)
BUN: 23 mg/dL — ABNORMAL HIGH (ref 6–20)
CO2: 23 mmol/L (ref 22–32)
Calcium: 7.9 mg/dL — ABNORMAL LOW (ref 8.9–10.3)
Chloride: 110 mmol/L (ref 101–111)
Creatinine, Ser: 1.23 mg/dL — ABNORMAL HIGH (ref 0.44–1.00)
GFR calc Af Amer: 51 mL/min — ABNORMAL LOW (ref >60)
GFR calc non Af Amer: 44 mL/min — ABNORMAL LOW (ref >60)
Glucose, Bld: 92 mg/dL (ref 65–99)
Potassium: 4.4 mmol/L (ref 3.5–5.1)
Sodium: 139 mmol/L (ref 135–145)

## 2015-08-15 LAB — C DIFFICILE QUICK SCREEN W PCR REFLEX
C Diff antigen: NEGATIVE
C Diff interpretation: NEGATIVE
C Diff toxin: NEGATIVE

## 2015-08-15 LAB — CBC
HCT: 29.4 % — ABNORMAL LOW (ref 36.0–46.0)
Hemoglobin: 9.4 g/dL — ABNORMAL LOW (ref 12.0–15.0)
MCH: 32.2 pg (ref 26.0–34.0)
MCHC: 32 g/dL (ref 30.0–36.0)
MCV: 100.7 fL — ABNORMAL HIGH (ref 78.0–100.0)
Platelets: 107 10*3/uL — ABNORMAL LOW (ref 150–400)
RBC: 2.92 MIL/uL — ABNORMAL LOW (ref 3.87–5.11)
RDW: 19.9 % — ABNORMAL HIGH (ref 11.5–15.5)
WBC: 5.8 10*3/uL (ref 4.0–10.5)

## 2015-08-15 NOTE — Progress Notes (Signed)
Noted redness on abdomen and back, pt states that "it itches like a rash". Cleansed area with soap and water and applied lotion. Will pass on to next shift in the morning and continue to monitor.

## 2015-08-15 NOTE — Progress Notes (Signed)
Patient Demographics  [name], is a 70 y.o. female, DOB - [birth year], [id number]  Admit date - 08/13/2015   Admitting Physician Theodis Blaze, MD  Outpatient Primary MD for the patient is [name], MD  LOS - 2   Chief Complaint  Patient presents with  . Fatigue  . Cough       Admission HPI/Brief narrative: 70 year old female with history of dermatomyositis for which she is taking Plaquenil, Methotrexate, and low-dose prednisone, obstructive sleep apnea noncompliant with CPAP, chronic nonproductive cough, bilateral chronic lower extremity lymphedema receiving wound care at home, morbid obesity, presents to [facility] with lower ext cellulits  Subjective:   [name] today has, No headache, No chest pain, No abdominal pain - No Nausea, report generalized weakness and fatigue . Assessment & Plan    Principal Problem:   Sepsis due to cellulitis [facility]) Active Problems:   CAD, NATIVE VESSEL   LEG EDEMA, BILATERAL   Morbid obesity (HCC)   Chronic anticoagulation   PAF (paroxysmal atrial fibrillation) (HCC)   Essential hypertension   Cellulitis, bilateral LE but R>L, abd wall    Acute kidney injury ([place])   Hypokalemia   History of iron deficiency anemia   Thrombocytopenia (HCC)   Sepsis (HCC)  Sepsis due to cellulitis /PNA/Streptococcus bacteremia - admitted  with sepsis as fever off 102 F, WBC 15 K on admission .source most likely cellulitis in multiple areas, UTI, and pneumonia. - Culture growing Streptococcus bacteremia - Following order sets in place: Sepsis, pneumonia, cellulitis e  - Influenza negative   Cellulitis, bilateral LE but R>L, abd wall  - order set in place,Blood culture growing Streptococcus bacteremia, narrow antibiotic coverage, will discontinue IV vancomycin, and continue with Zosyn. - Wound care team consult appreciated  CAP -CT chest with evidence  of left lower lobe pneumonia. - Legionella antigen negative, strep pneumonia antigen negative -  will discontinue IV vancomycin, continue with IV Zosyn   UTI - Continue withZosynollow on urine culture   Streptococcus bacteremia - Likely secondary to wound infection/cellulitis, Continue with IV Zosyn.   Acute kidney injury ([place]) - Appears to be prerenal in etiology, improving with IV fluids   LEG EDEMA, BILATERAL - Chronic, limiting physical mobility  - Wound care consultation    Morbid obesity ([place]) - BMI greater than 40    PAF (paroxysmal atrial fibrillation) ([place]) - Followed By Dr. Rayann Heman, currently not a candidate for anticoagulation secondary to falls.   Thrombocytopenia (HCC) - Mild, likely reactive, monitor   CAD, NATIVE VESSEL - No chest pain at this time    Essential hypertension - Reasonable blood pressure on admission    Hypokalemia - Repleted   History of iron deficiency anemia - No signs of bleeding, CBC in the morning  Fungal rash and abdominal folds - Continue with nystatin powder  Code Status: full  Family Communication: None at bedside  Disposition Plan: home with home PT in 2-3 days    Procedures  None   Consults   None   Medications  Scheduled Meds: . aspirin EC  325 mg Oral Daily  . cholecalciferol  2,000 Units Oral Daily  . enoxaparin (LOVENOX) injection  0.5 mg/kg Subcutaneous Q24H  . fluticasone  2 spray Each  Nare Daily  . folic acid  1 mg Oral QID  . guaiFENesin  1,200 mg Oral BID  . hydroxychloroquine  200 mg Oral BID  . levothyroxine  137 mcg Oral QAC breakfast  . loratadine  10 mg Oral Daily  . nystatin  1 g Topical BID  . pantoprazole  40 mg Oral BID AC  . piperacillin-tazobactam (ZOSYN)  IV  3.375 g Intravenous 3 times per day  . potassium chloride SA  40 mEq Oral Daily  . predniSONE  5 mg Oral Q breakfast  . sodium chloride flush  3 mL Intravenous Q12H  . vancomycin  1,000 mg Intravenous Q24H    Continuous Infusions:  PRN Meds:.acetaminophen, HYDROcodone-acetaminophen, ondansetron **OR** ondansetron (ZOFRAN) IV  DVT Prophylaxis  Lovenox   Lab Results  Component Value Date   PLT 107* 08/15/2015    Antibiotics    Anti-infectives    Start     Dose/Rate Route Frequency Ordered Stop   08/14/15 0600  vancomycin (VANCOCIN) IVPB 1000 mg/200 mL premix     1,000 mg 200 mL/hr over 60 Minutes Intravenous Every 24 hours 08/13/15 1101     08/13/15 1400  hydroxychloroquine (PLAQUENIL) tablet 200 mg     200 mg Oral 2 times daily 08/13/15 1254     08/13/15 1400  piperacillin-tazobactam (ZOSYN) IVPB 3.375 g     3.375 g 12.5 mL/hr over 240 Minutes Intravenous 3 times per day 08/13/15 1335     08/13/15 1330  piperacillin-tazobactam (ZOSYN) IVPB 3.375 g  Status:  Discontinued     3.375 g 100 mL/hr over 30 Minutes Intravenous  Once 08/13/15 1327 08/13/15 1329   08/13/15 1330  vancomycin (VANCOCIN) IVPB 1000 mg/200 mL premix     1,000 mg 200 mL/hr over 60 Minutes Intravenous  Once 08/13/15 1327 08/13/15 1620   08/13/15 1015  vancomycin (VANCOCIN) IVPB 1000 mg/200 mL premix     1,000 mg 200 mL/hr over 60 Minutes Intravenous  Once 08/13/15 1001 08/13/15 1254          Objective:   Filed Vitals:   08/14/15 0952 08/14/15 1506 08/14/15 2102 08/15/15 0644  BP: 98/43 101/47 107/45 127/56  Pulse: 81 86 96 86  Temp: 98.4 F (36.9 C) 98 F (36.7 C) 97.8 F (36.6 C) 97.5 F (36.4 C)  TempSrc: Oral Oral Oral Oral  Resp: 18 20 18 18   Height:      Weight:    92.126 kg (203 lb 1.6 oz)  SpO2: 100% 100% 99% 100%    Wt Readings from Last 3 Encounters:  08/15/15 92.126 kg (203 lb 1.6 oz)  08/07/15 94.802 kg (209 lb)  07/01/15 101.606 kg (224 lb)     Intake/Output Summary (Last 24 hours) at 08/15/15 1256 Last data filed at 08/15/15 0644  Gross per 24 hour  Intake    830 ml  Output      0 ml  Net    830 ml     Physical Exam  Awake Alert, Oriented X 3, .AT,PERRAL Supple  Neck,No JVD, No cervical lymphadenopathy appriciated.  Symmetrical Chest wall movement, Good air movement bilaterally, CTAB RRR,No Gallops,Rubs or new Murmurs, No Parasternal Heave +ve B.Sounds, Abd Soft, No tenderness, obese , fungal rash and abdominal folds  No Cyanosis, significant bilateral lower extremity lymphedema.   Data Review   Micro Results Recent Results (from the past 240 hour(s))  Culture, blood (x 2)     Status: None (Preliminary result)   Collection Time: 08/13/15 10:18  AM  Result Value Ref Range Status   Specimen Description BLOOD LEFT WRIST  Final   Special Requests BOTTLES DRAWN AEROBIC AND ANAEROBIC  5ML  Final   Culture  Setup Time   Final    GRAM POSITIVE COCCI IN CHAINS IN BOTH AEROBIC AND ANAEROBIC BOTTLES CRITICAL RESULT CALLED TO, READ BACK BY AND VERIFIED WITH: J ASARO,RN @0550  08/14/15 MKELLY    Culture   Final    STREPTOCOCCUS GROUP G SUSCEPTIBILITIES TO FOLLOW Performed at [facility]    Report Status PENDING  Incomplete  Respiratory virus panel     Status: None   Collection Time: 08/13/15  1:54 PM  Result Value Ref Range Status   Source - RVPAN NASOPHARYNGEAL SWAB  Corrected   Respiratory Syncytial Virus A Negative Negative Final   Respiratory Syncytial Virus B Negative Negative Final   Influenza A Negative Negative Final   Influenza B Negative Negative Final   Parainfluenza 1 Negative Negative Final   Parainfluenza 2 Negative Negative Final   Parainfluenza 3 Negative Negative Final   Metapneumovirus Negative Negative Final   Rhinovirus Negative Negative Final   Adenovirus Negative Negative Final    Comment: (NOTE) Performed At: [facility] [place], [place] [id number] Lindon Romp MD [id number]   Culture, blood (x 2)     Status: None (Preliminary result)   Collection Time: 08/13/15  3:49 PM  Result Value Ref Range Status   Specimen Description BLOOD LEFT HAND  Final   Special Requests IN PEDIATRIC  BOTTLE  0.5CC  Final   Culture   Final    NO GROWTH 2 DAYS Performed at [facility]    Report Status PENDING  Incomplete  Urine culture     Status: None (Preliminary result)   Collection Time: 08/13/15  7:25 PM  Result Value Ref Range Status   Specimen Description URINE, CLEAN CATCH  Final   Special Requests NONE  Final   Culture   Final    TOO YOUNG TO READ Performed at [facility]    Report Status PENDING  Incomplete  C difficile quick scan w PCR reflex     Status: None   Collection Time: 08/15/15  8:05 AM  Result Value Ref Range Status   C Diff antigen NEGATIVE NEGATIVE Final   C Diff toxin NEGATIVE NEGATIVE Final   C Diff interpretation Negative for toxigenic C. difficile  Final    Radiology Reports Dg Chest 2 View  08/13/2015  CLINICAL DATA:  New cough, congestion, shortness of breath and fever since Sunday. Increased weakness. EXAM: CHEST  2 VIEW COMPARISON:  Chest x-rays dated 06/12/2015 and 03/13/2015. Comparison also made to chest CT dated 07/07/2015. FINDINGS: Heart size is within normal limits. Overall cardiomediastinal silhouette is normal in size and configuration. Peribronchial thickening and mild bronchiectasis appears stable, compatible with findings on recent chest CT. Lungs otherwise clear. No confluent opacity to suggest a consolidating pneumonia. No pleural effusion. No pneumothorax. Degenerative changes are again noted throughout the kyphotic thoracolumbar spine. Stable compression fracture deformities are seen at the thoracolumbar junction status post earlier vertebroplasties. No acute osseous abnormality seen. IMPRESSION: 1. No acute findings. No evidence of pneumonia. No pleural effusion. Heart size is normal. 2. Chronic peribronchial thickening and mild bronchiectasis, compatible with findings described on recent chest CT report which suggested the possibility of a chronic indolent atypical infectious process such as MAI. Electronically Signed   By:  Roxy Horseman.D.  On: 08/13/2015 10:55   Ct Chest Wo Contrast  08/13/2015  CLINICAL DATA:  Cough and weakness EXAM: CT CHEST WITHOUT CONTRAST TECHNIQUE: Multidetector CT imaging of the chest was performed following the standard protocol without IV contrast. COMPARISON:  Plain film from earlier in the same day, 07/07/2015. FINDINGS: Lungs are well aerated bilaterally. Some patchy changes are noted in the left lower lobe new from the prior exam consistent with acute infiltrate. A few small subpleural nodules are noted stable from the previous exam. The thoracic inlet is within normal limits. No hilar or mediastinal adenopathy is noted. Mild aortic calcifications are seen as well as coronary calcifications stable from the previous exam. The visualized upper abdomen demonstrates a large central gallstone measuring approximately 4 cm in greatest dimension. No other focal abnormality is seen. Changes of prior vertebral augmentation are noted at T11 and T12. IMPRESSION: New left lower lobe mild infiltrate. Stable subpleural nodules. If the patient is at high risk for bronchogenic carcinoma, follow-up chest CT at 1 year is recommended. If the patient is at low risk, no follow-up is needed. This recommendation follows the consensus statement: Guidelines for Management of Small Pulmonary Nodules Detected on CT Scans: A Statement from the [employer] as published in Radiology 2005; 237:395-400. No other focal abnormality is noted. Electronically Signed   By: Inez Catalina M.D.   On: 08/13/2015 14:36     CBC  Recent Labs Lab 08/13/15 1019 08/14/15 0541 08/15/15 0554  WBC 15.3* 8.0 5.8  HGB 10.1* 9.3* 9.4*  HCT 30.7* 29.1* 29.4*  PLT 127* 100* 107*  MCV 100.3* 101.4* 100.7*  MCH 33.0 32.4 32.2  MCHC 32.9 32.0 32.0  RDW 19.9* 20.2* 19.9*  LYMPHSABS 0.9  --   --   MONOABS 0.7  --   --   EOSABS 0.0  --   --   BASOSABS 0.0  --   --     Chemistries   Recent Labs Lab 08/13/15 1019 08/13/15 1549  08/14/15 0541 08/15/15 0554  NA 139  --  136 139  K 3.2*  --  3.8 4.4  CL 106  --  106 110  CO2 22  --  22 23  GLUCOSE 113*  --  74 92  BUN 30*  --  29* 23*  CREATININE 1.52*  --  1.64* 1.23*  CALCIUM 8.0*  --  7.5* 7.9*  MG  --  1.7  --   --    ------------------------------------------------------------------------------------------------------------------ estimated creatinine clearance is 42.8 mL/min (by C-G formula based on Cr of 1.23). ------------------------------------------------------------------------------------------------------------------ No results for input(s): HGBA1C in the last 72 hours. ------------------------------------------------------------------------------------------------------------------ No results for input(s): CHOL, HDL, LDLCALC, TRIG, CHOLHDL, LDLDIRECT in the last 72 hours. ------------------------------------------------------------------------------------------------------------------  Recent Labs  08/14/15 0546  TSH 2.810   ------------------------------------------------------------------------------------------------------------------ No results for input(s): VITAMINB12, FOLATE, FERRITIN, TIBC, IRON, RETICCTPCT in the last 72 hours.  Coagulation profile  Recent Labs Lab 08/13/15 1549  INR 1.44    No results for input(s): DDIMER in the last 72 hours.  Cardiac Enzymes No results for input(s): CKMB, TROPONINI, MYOGLOBIN in the last 168 hours.  Invalid input(s): CK ------------------------------------------------------------------------------------------------------------------ Invalid input(s): POCBNP     Time Spent in minutes   30 minutes   Ebin Palazzi M.D on 08/15/2015 at 12:56 PM  Between 7am to 7pm - Pager - [phone]  After 7pm go to [email or web address] - password [facility]  [facility]   Office  [phone]

## 2015-08-16 DIAGNOSIS — R7881 Bacteremia: Secondary | ICD-10-CM

## 2015-08-16 DIAGNOSIS — I480 Paroxysmal atrial fibrillation: Secondary | ICD-10-CM

## 2015-08-16 LAB — URINE CULTURE: Culture: 20000

## 2015-08-16 LAB — CULTURE, BLOOD (ROUTINE X 2)

## 2015-08-16 MED ORDER — METOPROLOL SUCCINATE ER 25 MG PO TB24
25.0000 mg | ORAL_TABLET | Freq: Every day | ORAL | Status: DC
  Administered 2015-08-16 – 2015-08-18 (×3): 25 mg via ORAL
  Filled 2015-08-16 (×3): qty 1

## 2015-08-16 NOTE — Progress Notes (Signed)
Patient Demographics  [name], is a 70 y.o. female, DOB - Jul 02, 1945, [id number]  Admit date - 08/13/2015   Admitting Physician Theodis Blaze, MD  Outpatient Primary MD for the patient is [name], MD  LOS - 3   Chief Complaint  Patient presents with  . Fatigue  . Cough       Admission HPI/Brief narrative: 70 year old female with history of dermatomyositis for which she is taking Plaquenil, Methotrexate, and low-dose prednisone, obstructive sleep apnea noncompliant with CPAP, chronic nonproductive cough, bilateral chronic lower extremity lymphedema receiving wound care at home, morbid obesity, presents to [facility] with lower ext cellulits  Subjective:   [name] today has, No headache, No chest pain, No abdominal pain - No Nausea, reports feeling better today  Assessment & Plan    Principal Problem:   Sepsis due to cellulitis [facility]) Active Problems:   CAD, NATIVE VESSEL   LEG EDEMA, BILATERAL   Morbid obesity ([place])   Chronic anticoagulation   PAF (paroxysmal atrial fibrillation) (HCC)   Essential hypertension   Cellulitis, bilateral LE but R>L, abd wall    Acute kidney injury ([place])   Hypokalemia   History of iron deficiency anemia   Thrombocytopenia (HCC)   Sepsis ([place])  Sepsis due to cellulitis /PNA/Streptococcus bacteremia - admitted  with sepsis as fever off 102 F, WBC 15 K on admission .source most likely cellulitis in multiple areas, UTI, and pneumonia. - Culture growing Streptococcus bacteremia - Following order sets in place: Sepsis, pneumonia, cellulitis e  - Influenza negative   Cellulitis, bilateral LE but R>L, abd wall  - order set in place,Blood culture growing Streptococcus bacteremia, narrow antibiotic coverage,vancomycin stopped 3/5, and continue with Zosyn. - Wound care team consult appreciated  CAP -CT chest with evidence of left lower lobe  pneumonia. - Legionella antigen negative, strep pneumonia antigen negative -Vancomycin stopped 3/5, continue with IV Zosyn   UTI - Continue with Zosyn, urine culture growing 20,000 colonies of enterococcus species   Streptococcus bacteremia - Likely secondary to wound infection/cellulitis, Continue with IV Zosyn. - Repeat blood cultures today   Acute kidney injury ([place]) - Appears to be prerenal in etiology, improving with IV fluids   LEG EDEMA, BILATERAL - Chronic, limiting physical mobility  - Wound care consultation    Morbid obesity ([place]) - BMI greater than 40    PAF (paroxysmal atrial fibrillation) ([place]) - Followed By Dr. Rayann Heman, currently not a candidate for anticoagulation secondary to falls. - Resume on beta blocker giving her improved blood pressure   Thrombocytopenia (HCC) - Mild, likely reactive, monitor   CAD, NATIVE VESSEL - No chest pain at this time    Essential hypertension - Reasonable blood pressure on admission  - Low on hold initially giving soft blood pressure, currently blood pressure acceptable, resume back on metoprolol.   Hypokalemia - Repleted   History of iron deficiency anemia - No signs of bleeding, CBC in the morning  Fungal rash in  abdominal folds - Continue with nystatin powder  Code Status: full  Family Communication: discussed with husband at bedside  Disposition Plan: home with home PT in 2 days    Procedures  None   Consults   None   Medications  Scheduled Meds: . aspirin EC  325 mg Oral Daily  . cholecalciferol  2,000 Units Oral Daily  . enoxaparin (LOVENOX) injection  0.5 mg/kg Subcutaneous Q24H  . fluticasone  2 spray Each Nare Daily  . folic acid  1 mg Oral QID  . guaiFENesin  1,200 mg Oral BID  . hydroxychloroquine  200 mg Oral BID  . levothyroxine  137 mcg Oral QAC breakfast  . loratadine  10 mg Oral Daily  . metoprolol succinate  25 mg Oral Daily  . nystatin  1 g Topical BID  . pantoprazole   40 mg Oral BID AC  . piperacillin-tazobactam (ZOSYN)  IV  3.375 g Intravenous 3 times per day  . potassium chloride SA  40 mEq Oral Daily  . predniSONE  5 mg Oral Q breakfast  . sodium chloride flush  3 mL Intravenous Q12H   Continuous Infusions:  PRN Meds:.acetaminophen, HYDROcodone-acetaminophen, ondansetron **OR** ondansetron (ZOFRAN) IV  DVT Prophylaxis  Lovenox   Lab Results  Component Value Date   PLT 107* 08/15/2015    Antibiotics    Anti-infectives    Start     Dose/Rate Route Frequency Ordered Stop   08/14/15 0600  vancomycin (VANCOCIN) IVPB 1000 mg/200 mL premix  Status:  Discontinued     1,000 mg 200 mL/hr over 60 Minutes Intravenous Every 24 hours 08/13/15 1101 08/16/15 1308   08/13/15 1400  hydroxychloroquine (PLAQUENIL) tablet 200 mg     200 mg Oral 2 times daily 08/13/15 1254     08/13/15 1400  piperacillin-tazobactam (ZOSYN) IVPB 3.375 g     3.375 g 12.5 mL/hr over 240 Minutes Intravenous 3 times per day 08/13/15 1335     08/13/15 1330  piperacillin-tazobactam (ZOSYN) IVPB 3.375 g  Status:  Discontinued     3.375 g 100 mL/hr over 30 Minutes Intravenous  Once 08/13/15 1327 08/13/15 1329   08/13/15 1330  vancomycin (VANCOCIN) IVPB 1000 mg/200 mL premix     1,000 mg 200 mL/hr over 60 Minutes Intravenous  Once 08/13/15 1327 08/13/15 1620   08/13/15 1015  vancomycin (VANCOCIN) IVPB 1000 mg/200 mL premix     1,000 mg 200 mL/hr over 60 Minutes Intravenous  Once 08/13/15 1001 08/13/15 1254          Objective:   Filed Vitals:   08/15/15 1430 08/15/15 2032 08/16/15 0637 08/16/15 0643  BP: 128/51 123/59  112/40  Pulse: 86 85 88   Temp: 97.5 F (36.4 C) 97.6 F (36.4 C) 97.8 F (36.6 C)   TempSrc: Oral Oral Oral   Resp: 18 18 18    Height:      Weight:   97.569 kg (215 lb 1.6 oz)   SpO2: 100% 100% 99%     Wt Readings from Last 3 Encounters:  08/16/15 97.569 kg (215 lb 1.6 oz)  08/07/15 94.802 kg (209 lb)  07/01/15 101.606 kg (224 lb)      Intake/Output Summary (Last 24 hours) at 08/16/15 1325 Last data filed at 08/16/15 1230  Gross per 24 hour  Intake    470 ml  Output      2 ml  Net    468 ml     Physical Exam  Awake Alert, Oriented X 3, [place].AT,PERRAL Supple Neck,No JVD, No cervical lymphadenopathy appriciated.  Symmetrical Chest wall movement, Good air movement bilaterally, CTAB RRR,No Gallops,Rubs or new Murmurs, No Parasternal Heave +ve B.Sounds, Abd Soft, No tenderness, obese , fungal rash and abdominal folds  No Cyanosis, significant bilateral  lower extremity lymphedema.   Data Review   Micro Results Recent Results (from the past 240 hour(s))  Culture, blood (x 2)     Status: None   Collection Time: 08/13/15 10:18 AM  Result Value Ref Range Status   Specimen Description BLOOD LEFT WRIST  Final   Special Requests BOTTLES DRAWN AEROBIC AND ANAEROBIC  5ML  Final   Culture  Setup Time   Final    GRAM POSITIVE COCCI IN CHAINS IN BOTH AEROBIC AND ANAEROBIC BOTTLES CRITICAL RESULT CALLED TO, READ BACK BY AND VERIFIED WITH: J ASARO,RN @0550  08/14/15 MKELLY    Culture   Final    STREPTOCOCCUS GROUP G Performed at [facility]    Report Status 08/16/2015 FINAL  Final   Organism ID, Bacteria STREPTOCOCCUS GROUP G  Final      Susceptibility   Streptococcus group g - MIC*    CLINDAMYCIN <=0.25 SENSITIVE Sensitive     AMPICILLIN <=0.25 SENSITIVE Sensitive     ERYTHROMYCIN <=0.12 SENSITIVE Sensitive     VANCOMYCIN 0.5 SENSITIVE Sensitive     CEFTRIAXONE <=0.12 SENSITIVE Sensitive     LEVOFLOXACIN 0.5 SENSITIVE Sensitive     * STREPTOCOCCUS GROUP G  Respiratory virus panel     Status: None   Collection Time: 08/13/15  1:54 PM  Result Value Ref Range Status   Source - RVPAN NASOPHARYNGEAL SWAB  Corrected   Respiratory Syncytial Virus A Negative Negative Final   Respiratory Syncytial Virus B Negative Negative Final   Influenza A Negative Negative Final   Influenza B Negative Negative Final    Parainfluenza 1 Negative Negative Final   Parainfluenza 2 Negative Negative Final   Parainfluenza 3 Negative Negative Final   Metapneumovirus Negative Negative Final   Rhinovirus Negative Negative Final   Adenovirus Negative Negative Final    Comment: (NOTE) Performed At: [facility] [address] [place], [place] [id number] Lindon Romp MD [id number]   Culture, blood (x 2)     Status: None (Preliminary result)   Collection Time: 08/13/15  3:49 PM  Result Value Ref Range Status   Specimen Description BLOOD LEFT HAND  Final   Special Requests IN PEDIATRIC BOTTLE  0.5CC  Final   Culture   Final    NO GROWTH 2 DAYS Performed at [facility]    Report Status PENDING  Incomplete  Urine culture     Status: None (Preliminary result)   Collection Time: 08/13/15  7:25 PM  Result Value Ref Range Status   Specimen Description URINE, CLEAN CATCH  Final   Special Requests NONE  Final   Culture   Final    20,000 COLONIES/mL ENTEROCOCCUS SPECIES Performed at [facility]    Report Status PENDING  Incomplete  C difficile quick scan w PCR reflex     Status: None   Collection Time: 08/15/15  8:05 AM  Result Value Ref Range Status   C Diff antigen NEGATIVE NEGATIVE Final   C Diff toxin NEGATIVE NEGATIVE Final   C Diff interpretation Negative for toxigenic C. difficile  Final  Culture, blood (Routine X 2) w Reflex to ID Panel     Status: None (Preliminary result)   Collection Time: 08/16/15  8:56 AM  Result Value Ref Range Status   Specimen Description BLOOD RIGHT HAND  Final   Special Requests BOTTLES DRAWN AEROBIC ONLY 5 CC  Final   Culture PENDING  Incomplete   Report Status PENDING  Incomplete  Radiology Reports Dg Chest 2 View  08/13/2015  CLINICAL DATA:  New cough, congestion, shortness of breath and fever since Sunday. Increased weakness. EXAM: CHEST  2 VIEW COMPARISON:  Chest x-rays dated 06/12/2015 and 03/13/2015. Comparison also made to chest CT  dated 07/07/2015. FINDINGS: Heart size is within normal limits. Overall cardiomediastinal silhouette is normal in size and configuration. Peribronchial thickening and mild bronchiectasis appears stable, compatible with findings on recent chest CT. Lungs otherwise clear. No confluent opacity to suggest a consolidating pneumonia. No pleural effusion. No pneumothorax. Degenerative changes are again noted throughout the kyphotic thoracolumbar spine. Stable compression fracture deformities are seen at the thoracolumbar junction status post earlier vertebroplasties. No acute osseous abnormality seen. IMPRESSION: 1. No acute findings. No evidence of pneumonia. No pleural effusion. Heart size is normal. 2. Chronic peribronchial thickening and mild bronchiectasis, compatible with findings described on recent chest CT report which suggested the possibility of a chronic indolent atypical infectious process such as MAI. Electronically Signed   By: Franki Cabot M.D.   On: 08/13/2015 10:55   Ct Chest Wo Contrast  08/13/2015  CLINICAL DATA:  Cough and weakness EXAM: CT CHEST WITHOUT CONTRAST TECHNIQUE: Multidetector CT imaging of the chest was performed following the standard protocol without IV contrast. COMPARISON:  Plain film from earlier in the same day, 07/07/2015. FINDINGS: Lungs are well aerated bilaterally. Some patchy changes are noted in the left lower lobe new from the prior exam consistent with acute infiltrate. A few small subpleural nodules are noted stable from the previous exam. The thoracic inlet is within normal limits. No hilar or mediastinal adenopathy is noted. Mild aortic calcifications are seen as well as coronary calcifications stable from the previous exam. The visualized upper abdomen demonstrates a large central gallstone measuring approximately 4 cm in greatest dimension. No other focal abnormality is seen. Changes of prior vertebral augmentation are noted at T11 and T12. IMPRESSION: New left lower  lobe mild infiltrate. Stable subpleural nodules. If the patient is at high risk for bronchogenic carcinoma, follow-up chest CT at 1 year is recommended. If the patient is at low risk, no follow-up is needed. This recommendation follows the consensus statement: Guidelines for Management of Small Pulmonary Nodules Detected on CT Scans: A Statement from the [place] as published in Radiology 2005; 237:395-400. No other focal abnormality is noted. Electronically Signed   By: Inez Catalina M.D.   On: 08/13/2015 14:36     CBC  Recent Labs Lab 08/13/15 1019 08/14/15 0541 08/15/15 0554  WBC 15.3* 8.0 5.8  HGB 10.1* 9.3* 9.4*  HCT 30.7* 29.1* 29.4*  PLT 127* 100* 107*  MCV 100.3* 101.4* 100.7*  MCH 33.0 32.4 32.2  MCHC 32.9 32.0 32.0  RDW 19.9* 20.2* 19.9*  LYMPHSABS 0.9  --   --   MONOABS 0.7  --   --   EOSABS 0.0  --   --   BASOSABS 0.0  --   --     Chemistries   Recent Labs Lab 08/13/15 1019 08/13/15 1549 08/14/15 0541 08/15/15 0554  NA 139  --  136 139  K 3.2*  --  3.8 4.4  CL 106  --  106 110  CO2 22  --  22 23  GLUCOSE 113*  --  74 92  BUN 30*  --  29* 23*  CREATININE 1.52*  --  1.64* 1.23*  CALCIUM 8.0*  --  7.5* 7.9*  MG  --  1.7  --   --    ------------------------------------------------------------------------------------------------------------------  estimated creatinine clearance is 44.3 mL/min (by C-G formula based on Cr of 1.23). ------------------------------------------------------------------------------------------------------------------ No results for input(s): HGBA1C in the last 72 hours. ------------------------------------------------------------------------------------------------------------------ No results for input(s): CHOL, HDL, LDLCALC, TRIG, CHOLHDL, LDLDIRECT in the last 72 hours. ------------------------------------------------------------------------------------------------------------------  Recent Labs  08/14/15 0546  TSH 2.810    ------------------------------------------------------------------------------------------------------------------ No results for input(s): VITAMINB12, FOLATE, FERRITIN, TIBC, IRON, RETICCTPCT in the last 72 hours.  Coagulation profile  Recent Labs Lab 08/13/15 1549  INR 1.44    No results for input(s): DDIMER in the last 72 hours.  Cardiac Enzymes No results for input(s): CKMB, TROPONINI, MYOGLOBIN in the last 168 hours.  Invalid input(s): CK ------------------------------------------------------------------------------------------------------------------ Invalid input(s): POCBNP     Time Spent in minutes   30 minutes   Peggye Poon M.D on 08/16/2015 at 1:25 PM  Between 7am to 7pm - Pager - [phone]  After 7pm go to [email or web address] - password [facility]  [facility]   Office  [phone]

## 2015-08-16 NOTE — Progress Notes (Signed)
Pharmacy Antibiotic Note  [name] is a 70 y.o. female admitted on 08/13/2015 with cellulitis.  Pharmacy has been consulted for Zosyn dosing. Erythema noted on R leg.  Checking influenza PCR for bodt aches, fever, cough. Scr elevated at admission.  Diarrhea to cephalexin  Plan:  Group G strep from BCx 3/2, Vancomcyin discontinued  No change to Zosyn 3.375gm IV q8hr   Temp (24hrs), Avg:97.6 F (36.4 C), Min:97.5 F (36.4 C), Max:97.8 F (36.6 C)   Recent Labs Lab 08/13/15 1019 08/13/15 1549 08/14/15 0541 08/15/15 0554  WBC 15.3*  --  8.0 5.8  CREATININE 1.52*  --  1.64* 1.23*  LATICACIDVEN 1.9 1.6  --   --     Estimated Creatinine Clearance: 44.3 mL/min (by C-G formula based on Cr of 1.23).    Allergies  Allergen Reactions  . Butoconazole Itching, Rash and Other (See Comments)    'Redness and swelling feverish ' 'worse symptoms '  . Lipitor [Atorvastatin Calcium] Other (See Comments)    'Muscle weakness'    . Keflex [Cephalexin] Diarrhea   Antimicrobials this admission: Vancomycin 3/2 >> 3/5 Zosyn 3/2 >>   Microbiology results: 3/2 Ucx: ngtd 2/3 Bcx (1549): pending (after abx begun) 3/2 Bcx (1018): Group G strep 3/2 Resp virus panel: neg 3/2 Strep pneumo: neg/ Legionella: neg 3/4 CDiff: negative  Thank you for allowing pharmacy to be a part of this patient's care.  Minda Ditto PharmD Pager [phone] 08/16/2015, 1:15 PM

## 2015-08-17 LAB — CBC
HCT: 31.7 % — ABNORMAL LOW (ref 36.0–46.0)
Hemoglobin: 10.1 g/dL — ABNORMAL LOW (ref 12.0–15.0)
MCH: 32.6 pg (ref 26.0–34.0)
MCHC: 31.9 g/dL (ref 30.0–36.0)
MCV: 102.3 fL — ABNORMAL HIGH (ref 78.0–100.0)
Platelets: 146 10*3/uL — ABNORMAL LOW (ref 150–400)
RBC: 3.1 MIL/uL — ABNORMAL LOW (ref 3.87–5.11)
RDW: 20.1 % — ABNORMAL HIGH (ref 11.5–15.5)
WBC: 7 10*3/uL (ref 4.0–10.5)

## 2015-08-17 LAB — BASIC METABOLIC PANEL
Anion gap: 5 (ref 5–15)
BUN: 14 mg/dL (ref 6–20)
CO2: 21 mmol/L — ABNORMAL LOW (ref 22–32)
Calcium: 7.9 mg/dL — ABNORMAL LOW (ref 8.9–10.3)
Chloride: 111 mmol/L (ref 101–111)
Creatinine, Ser: 1.08 mg/dL — ABNORMAL HIGH (ref 0.44–1.00)
GFR calc Af Amer: 59 mL/min — ABNORMAL LOW (ref >60)
GFR calc non Af Amer: 51 mL/min — ABNORMAL LOW (ref >60)
Glucose, Bld: 78 mg/dL (ref 65–99)
Potassium: 4 mmol/L (ref 3.5–5.1)
Sodium: 137 mmol/L (ref 135–145)

## 2015-08-17 MED ORDER — RISAQUAD PO CAPS
2.0000 | ORAL_CAPSULE | Freq: Every day | ORAL | Status: DC
  Administered 2015-08-17 – 2015-08-18 (×2): 2 via ORAL
  Filled 2015-08-17 (×2): qty 2

## 2015-08-17 NOTE — Care Management Important Message (Signed)
Important Message  Patient Details  Name: [name] MRN: [id number] Date of Birth: July 30, 1945   Medicare Important Message Given:  Yes    Elysse, Franzel 08/17/2015, 3:30 [place] Message  Patient Details  Name: [name] MRN: [id number] Date of Birth: [birth year]   Medicare Important Message Given:  Yes    Kynslie, Calitri 08/17/2015, 3:30 PM

## 2015-08-17 NOTE — Progress Notes (Signed)
Pt is unable to do her own dressing changes, pts husband will need further continuous teaching on how to change pts dressing to LLE.  Rosine Beat, RN

## 2015-08-17 NOTE — Progress Notes (Signed)
Patient Demographics  [name], is a 70 y.o. female, DOB - 10/20/1945, [id number]  Admit date - 08/13/2015   Admitting Physician Theodis Blaze, MD  Outpatient Primary MD for the patient is [name], MD  LOS - 4   Chief Complaint  Patient presents with  . Fatigue  . Cough       Admission HPI/Brief narrative: 70 year old female with history of dermatomyositis for which she is taking Plaquenil, Methotrexate, and low-dose prednisone, obstructive sleep apnea noncompliant with CPAP, chronic nonproductive cough, bilateral chronic lower extremity lymphedema receiving wound care at home, morbid obesity, presents to [place] long with lower ext cellulits  Subjective:   [name] today has, No headache, No chest pain, No abdominal pain - No Nausea, reports feeling better today  Assessment & Plan    Principal Problem:   Sepsis due to cellulitis [facility]) Active Problems:   CAD, NATIVE VESSEL   LEG EDEMA, BILATERAL   Morbid obesity ([place])   Chronic anticoagulation   PAF (paroxysmal atrial fibrillation) (HCC)   Essential hypertension   Cellulitis, bilateral LE but R>L, abd wall    Acute kidney injury ([place])   Hypokalemia   History of iron deficiency anemia   Thrombocytopenia (HCC)   Sepsis ([place])  Sepsis due to cellulitis /PNA/Streptococcus bacteremia - admitted  with sepsis as fever off 102 F, WBC 15 K on admission .source most likely cellulitis in multiple areas, UTI, and pneumonia. - Culture growing Streptococcus bacteremia - Following order sets in place: Sepsis, pneumonia, cellulitis  - Influenza negative   Cellulitis, bilateral LE but R>L, abd wall  - order set in place,Blood culture growing Streptococcus bacteremia, narrow antibiotic coverage,vancomycin stopped 3/5, and continue with Zosyn. - Wound care team consult appreciated - Case management consulted to continue wound care at  home  CAP -CT chest with evidence of left lower lobe pneumonia. - Legionella antigen negative, strep pneumonia antigen negative -Vancomycin stopped 3/5, continue with IV Zosyn   UTI - Continue with Zosyn, urine culture growing 20,000 colonies of enterococcus species   Streptococcus bacteremia - Likely secondary to wound infection/cellulitis, Continue with IV Zosyn. - Blood culture repeated on 3/5, follow repeat blood cultures   Acute kidney injury ([place]) - Appears to be prerenal in etiology, improving with IV fluids   LEG EDEMA, BILATERAL - Chronic, limiting physical mobility  - Wound care consultation    Morbid obesity ([place]) - BMI greater than 40    PAF (paroxysmal atrial fibrillation) ([place]) - Followed By Dr. Rayann Heman, currently not a candidate for anticoagulation secondary to falls. - Resume on beta blocker giving her improved blood pressure   Thrombocytopenia (HCC) - Mild, likely reactive, monitor   CAD, NATIVE VESSEL - No chest pain at this time    Essential hypertension - Reasonable blood pressure on admission  - Low on hold initially giving soft blood pressure, currently blood pressure acceptable, resume back on metoprolol.   Hypokalemia - Repleted   History of iron deficiency anemia - No signs of bleeding, CBC in the morning  Fungal rash in  abdominal folds - Continue with nystatin powder  Code Status: full  Family Communication: Past with patient  Disposition Plan: home with home PT tomorrow if repeat blood culture remains negative  Procedures  None   Consults   None   Medications  Scheduled Meds: . aspirin EC  325 mg Oral Daily  . cholecalciferol  2,000 Units Oral Daily  . enoxaparin (LOVENOX) injection  0.5 mg/kg Subcutaneous Q24H  . fluticasone  2 spray Each Nare Daily  . folic acid  1 mg Oral QID  . guaiFENesin  1,200 mg Oral BID  . hydroxychloroquine  200 mg Oral BID  . levothyroxine  137 mcg Oral QAC breakfast  .  loratadine  10 mg Oral Daily  . metoprolol succinate  25 mg Oral Daily  . nystatin  1 g Topical BID  . pantoprazole  40 mg Oral BID AC  . piperacillin-tazobactam (ZOSYN)  IV  3.375 g Intravenous 3 times per day  . potassium chloride SA  40 mEq Oral Daily  . predniSONE  5 mg Oral Q breakfast  . sodium chloride flush  3 mL Intravenous Q12H   Continuous Infusions:  PRN Meds:.acetaminophen, HYDROcodone-acetaminophen, ondansetron **OR** ondansetron (ZOFRAN) IV  DVT Prophylaxis  Lovenox   Lab Results  Component Value Date   PLT 146* 08/17/2015    Antibiotics    Anti-infectives    Start     Dose/Rate Route Frequency Ordered Stop   08/14/15 0600  vancomycin (VANCOCIN) IVPB 1000 mg/200 mL premix  Status:  Discontinued     1,000 mg 200 mL/hr over 60 Minutes Intravenous Every 24 hours 08/13/15 1101 08/16/15 1308   08/13/15 1400  hydroxychloroquine (PLAQUENIL) tablet 200 mg     200 mg Oral 2 times daily 08/13/15 1254     08/13/15 1400  piperacillin-tazobactam (ZOSYN) IVPB 3.375 g     3.375 g 12.5 mL/hr over 240 Minutes Intravenous 3 times per day 08/13/15 1335     08/13/15 1330  piperacillin-tazobactam (ZOSYN) IVPB 3.375 g  Status:  Discontinued     3.375 g 100 mL/hr over 30 Minutes Intravenous  Once 08/13/15 1327 08/13/15 1329   08/13/15 1330  vancomycin (VANCOCIN) IVPB 1000 mg/200 mL premix     1,000 mg 200 mL/hr over 60 Minutes Intravenous  Once 08/13/15 1327 08/13/15 1620   08/13/15 1015  vancomycin (VANCOCIN) IVPB 1000 mg/200 mL premix     1,000 mg 200 mL/hr over 60 Minutes Intravenous  Once 08/13/15 1001 08/13/15 1254          Objective:   Filed Vitals:   08/16/15 0643 08/16/15 1407 08/16/15 2106 08/17/15 0513  BP: 112/40 118/50 125/49 112/50  Pulse:  96 80 70  Temp:  97.5 F (36.4 C) 98.1 F (36.7 C) 97.7 F (36.5 C)  TempSrc:  Oral Oral Oral  Resp:  16 18 18   Height:      Weight:    98.022 kg (216 lb 1.6 oz)  SpO2:  100% 98% 100%    Wt Readings from Last 3  Encounters:  08/17/15 98.022 kg (216 lb 1.6 oz)  08/07/15 94.802 kg (209 lb)  07/01/15 101.606 kg (224 lb)     Intake/Output Summary (Last 24 hours) at 08/17/15 1321 Last data filed at 08/17/15 0843  Gross per 24 hour  Intake    870 ml  Output      0 ml  Net    870 ml     Physical Exam  Awake Alert, Oriented X 3, [place].AT,PERRAL Supple Neck,No JVD, No cervical lymphadenopathy appriciated.  Symmetrical Chest wall movement, Good air movement bilaterally, CTAB RRR,No Gallops,Rubs or new Murmurs, No Parasternal Heave +ve B.Sounds, Abd Soft, No  tenderness, obese , fungal rash and abdominal folds  No Cyanosis, significant bilateral lower extremity lymphedema.   Data Review   Micro Results Recent Results (from the past 240 hour(s))  Culture, blood (x 2)     Status: None   Collection Time: 08/13/15 10:18 AM  Result Value Ref Range Status   Specimen Description BLOOD LEFT WRIST  Final   Special Requests BOTTLES DRAWN AEROBIC AND ANAEROBIC  5ML  Final   Culture  Setup Time   Final    GRAM POSITIVE COCCI IN CHAINS IN BOTH AEROBIC AND ANAEROBIC BOTTLES CRITICAL RESULT CALLED TO, READ BACK BY AND VERIFIED WITH: J ASARO,RN @0550  08/14/15 MKELLY    Culture   Final    STREPTOCOCCUS GROUP G Performed at [facility]    Report Status 08/16/2015 FINAL  Final   Organism ID, Bacteria STREPTOCOCCUS GROUP G  Final      Susceptibility   Streptococcus group g - MIC*    CLINDAMYCIN <=0.25 SENSITIVE Sensitive     AMPICILLIN <=0.25 SENSITIVE Sensitive     ERYTHROMYCIN <=0.12 SENSITIVE Sensitive     VANCOMYCIN 0.5 SENSITIVE Sensitive     CEFTRIAXONE <=0.12 SENSITIVE Sensitive     LEVOFLOXACIN 0.5 SENSITIVE Sensitive     * STREPTOCOCCUS GROUP G  Respiratory virus panel     Status: None   Collection Time: 08/13/15  1:54 PM  Result Value Ref Range Status   Source - RVPAN NASOPHARYNGEAL SWAB  Corrected   Respiratory Syncytial Virus A Negative Negative Final   Respiratory Syncytial  Virus B Negative Negative Final   Influenza A Negative Negative Final   Influenza B Negative Negative Final   Parainfluenza 1 Negative Negative Final   Parainfluenza 2 Negative Negative Final   Parainfluenza 3 Negative Negative Final   Metapneumovirus Negative Negative Final   Rhinovirus Negative Negative Final   Adenovirus Negative Negative Final    Comment: (NOTE) Performed At: [facility] [address] [place], [place] [id number] Lindon Romp MD [id number]   Culture, blood (x 2)     Status: None (Preliminary result)   Collection Time: 08/13/15  3:49 PM  Result Value Ref Range Status   Specimen Description BLOOD LEFT HAND  Final   Special Requests IN PEDIATRIC BOTTLE  0.5CC  Final   Culture   Final    NO GROWTH 3 DAYS Performed at [facility]    Report Status PENDING  Incomplete  Urine culture     Status: None   Collection Time: 08/13/15  7:25 PM  Result Value Ref Range Status   Specimen Description URINE, CLEAN CATCH  Final   Special Requests NONE  Final   Culture   Final    20,000 COLONIES/mL ENTEROCOCCUS SPECIES Performed at [facility]    Report Status 08/16/2015 FINAL  Final   Organism ID, Bacteria ENTEROCOCCUS SPECIES  Final      Susceptibility   Enterococcus species - MIC*    AMPICILLIN <=2 SENSITIVE Sensitive     LEVOFLOXACIN 0.5 SENSITIVE Sensitive     NITROFURANTOIN <=16 SENSITIVE Sensitive     VANCOMYCIN 1 SENSITIVE Sensitive     * 20,000 COLONIES/mL ENTEROCOCCUS SPECIES  C difficile quick scan w PCR reflex     Status: None   Collection Time: 08/15/15  8:05 AM  Result Value Ref Range Status   C Diff antigen NEGATIVE NEGATIVE Final   C Diff toxin NEGATIVE NEGATIVE Final   C Diff interpretation Negative for toxigenic C.  difficile  Final  Culture, blood (Routine X 2) w Reflex to ID Panel     Status: None (Preliminary result)   Collection Time: 08/16/15  8:56 AM  Result Value Ref Range Status   Specimen Description BLOOD  RIGHT HAND  Final   Special Requests BOTTLES DRAWN AEROBIC ONLY 5 CC  Final   Culture PENDING  Incomplete   Report Status PENDING  Incomplete    Radiology Reports Dg Chest 2 View  08/13/2015  CLINICAL DATA:  New cough, congestion, shortness of breath and fever since Sunday. Increased weakness. EXAM: CHEST  2 VIEW COMPARISON:  Chest x-rays dated 06/12/2015 and 03/13/2015. Comparison also made to chest CT dated 07/07/2015. FINDINGS: Heart size is within normal limits. Overall cardiomediastinal silhouette is normal in size and configuration. Peribronchial thickening and mild bronchiectasis appears stable, compatible with findings on recent chest CT. Lungs otherwise clear. No confluent opacity to suggest a consolidating pneumonia. No pleural effusion. No pneumothorax. Degenerative changes are again noted throughout the kyphotic thoracolumbar spine. Stable compression fracture deformities are seen at the thoracolumbar junction status post earlier vertebroplasties. No acute osseous abnormality seen. IMPRESSION: 1. No acute findings. No evidence of pneumonia. No pleural effusion. Heart size is normal. 2. Chronic peribronchial thickening and mild bronchiectasis, compatible with findings described on recent chest CT report which suggested the possibility of a chronic indolent atypical infectious process such as MAI. Electronically Signed   By: Franki Cabot M.D.   On: 08/13/2015 10:55   Ct Chest Wo Contrast  08/13/2015  CLINICAL DATA:  Cough and weakness EXAM: CT CHEST WITHOUT CONTRAST TECHNIQUE: Multidetector CT imaging of the chest was performed following the standard protocol without IV contrast. COMPARISON:  Plain film from earlier in the same day, 07/07/2015. FINDINGS: Lungs are well aerated bilaterally. Some patchy changes are noted in the left lower lobe new from the prior exam consistent with acute infiltrate. A few small subpleural nodules are noted stable from the previous exam. The thoracic inlet is  within normal limits. No hilar or mediastinal adenopathy is noted. Mild aortic calcifications are seen as well as coronary calcifications stable from the previous exam. The visualized upper abdomen demonstrates a large central gallstone measuring approximately 4 cm in greatest dimension. No other focal abnormality is seen. Changes of prior vertebral augmentation are noted at T11 and T12. IMPRESSION: New left lower lobe mild infiltrate. Stable subpleural nodules. If the patient is at high risk for bronchogenic carcinoma, follow-up chest CT at 1 year is recommended. If the patient is at low risk, no follow-up is needed. This recommendation follows the consensus statement: Guidelines for Management of Small Pulmonary Nodules Detected on CT Scans: A Statement from the [place] as published in Radiology 2005; 237:395-400. No other focal abnormality is noted. Electronically Signed   By: Inez Catalina M.D.   On: 08/13/2015 14:36     CBC  Recent Labs Lab 08/13/15 1019 08/14/15 0541 08/15/15 0554 08/17/15 0545  WBC 15.3* 8.0 5.8 7.0  HGB 10.1* 9.3* 9.4* 10.1*  HCT 30.7* 29.1* 29.4* 31.7*  PLT 127* 100* 107* 146*  MCV 100.3* 101.4* 100.7* 102.3*  MCH 33.0 32.4 32.2 32.6  MCHC 32.9 32.0 32.0 31.9  RDW 19.9* 20.2* 19.9* 20.1*  LYMPHSABS 0.9  --   --   --   MONOABS 0.7  --   --   --   EOSABS 0.0  --   --   --   BASOSABS 0.0  --   --   --  Chemistries   Recent Labs Lab 08/13/15 1019 08/13/15 1549 08/14/15 0541 08/15/15 0554 08/17/15 0545  NA 139  --  136 139 137  K 3.2*  --  3.8 4.4 4.0  CL 106  --  106 110 111  CO2 22  --  22 23 21*  GLUCOSE 113*  --  74 92 78  BUN 30*  --  29* 23* 14  CREATININE 1.52*  --  1.64* 1.23* 1.08*  CALCIUM 8.0*  --  7.5* 7.9* 7.9*  MG  --  1.7  --   --   --    ------------------------------------------------------------------------------------------------------------------ estimated creatinine clearance is 50.5 mL/min (by C-G formula based on Cr  of 1.08). ------------------------------------------------------------------------------------------------------------------ No results for input(s): HGBA1C in the last 72 hours. ------------------------------------------------------------------------------------------------------------------ No results for input(s): CHOL, HDL, LDLCALC, TRIG, CHOLHDL, LDLDIRECT in the last 72 hours. ------------------------------------------------------------------------------------------------------------------ No results for input(s): TSH, T4TOTAL, T3FREE, THYROIDAB in the last 72 hours.  Invalid input(s): FREET3 ------------------------------------------------------------------------------------------------------------------ No results for input(s): VITAMINB12, FOLATE, FERRITIN, TIBC, IRON, RETICCTPCT in the last 72 hours.  Coagulation profile  Recent Labs Lab 08/13/15 1549  INR 1.44    No results for input(s): DDIMER in the last 72 hours.  Cardiac Enzymes No results for input(s): CKMB, TROPONINI, MYOGLOBIN in the last 168 hours.  Invalid input(s): CK ------------------------------------------------------------------------------------------------------------------ Invalid input(s): POCBNP     Time Spent in minutes   20 minutes   Kannan Proia M.D on 08/17/2015 at 1:21 PM  Between 7am to 7pm - Pager - [phone]  After 7pm go to [email or web address] - password [facility]  [facility]   Office  [phone]

## 2015-08-17 NOTE — Progress Notes (Addendum)
Physical Therapy Treatment Patient Details Name: [name] MRN: [id number] DOB: 01/13/1946 Today's Date: 08/17/2015    History of Present Illness 70 year old female with PMH dermatomyositis, obstructive sleep apnea and known noncompliance with CPAP, chronic nonproductive cough, bilateral chronic lower extremity lymphedema receiving wound care at home, morbid obesity, presents to [facility] emergency department for evaluation of one week duration of progressive fatigue and illness, body aches and malaise, subjective fevers and chills, poor oral intake; RLE and abdominal erythema; weakness and difficulty with ambulation.    PT Comments    Progressing with mobility. Pt able to walk to and from bathroom. Too fatigued to ambulate into hallway.   Follow Up Recommendations  Home health PT;Supervision/Assistance - 24 hour     Equipment Recommendations  None recommended by PT    Recommendations for Other Services       Precautions / Restrictions Precautions Precautions: Fall Precaution Comments: wound on bottom Restrictions Weight Bearing Restrictions: No    Mobility  Bed Mobility               General bed mobility comments: oob in recliner  Transfers Overall transfer level: Needs assistance Equipment used: Rolling walker (2 wheeled) Transfers: Sit to/from Stand Sit to Stand: Min assist         General transfer comment: assist to rise, stabilize, control descent.   Ambulation/Gait Ambulation/Gait assistance: Min guard Ambulation Distance (Feet): 15 Feet (x2) Assistive device: Rolling walker (2 wheeled) Gait Pattern/deviations: Step-through pattern;Decreased stride length     General Gait Details: close guard for safety. VCs for RW use safety. slow gait speed.   Stairs            Wheelchair Mobility    Modified Rankin (Stroke Patients Only)       Balance Overall balance assessment: Needs assistance         Standing balance support: Bilateral  upper extremity supported;During functional activity Standing balance-Leahy Scale: Poor                      Cognition Arousal/Alertness: Awake/alert Behavior During Therapy: WFL for tasks assessed/performed Overall Cognitive Status: Within Functional Limits for tasks assessed                      Exercises      General Comments        Pertinent Vitals/Pain Pain Assessment: No/denies pain    Home Living                      Prior Function            PT Goals (current goals can now be found in the care plan section) Progress towards PT goals: Progressing toward goals    Frequency  Min 3X/week    PT Plan Current plan remains appropriate    Co-evaluation PT/OT/SLP Co-Evaluation/Treatment: Yes Reason for Co-Treatment:  (activity tolerance) PT goals addressed during session: Mobility/safety with mobility;Proper use of DME OT goals addressed during session: ADL's and self-care;Proper use of Adaptive equipment and DME     End of Session   Activity Tolerance: Patient limited by fatigue Patient left: in chair;with call bell/phone within reach;with family/visitor present     Time: [id number] PT Time Calculation (min) (ACUTE ONLY): 19 min  Charges:  $Gait Training: 8-22 mins                    G Codes:  Weston Anna, MPT Pager: [phone]

## 2015-08-17 NOTE — Care Management Note (Signed)
Case Management Note  Patient Details  Name: [name] MRN: [id number] Date of Birth: 1946-03-12  Subjective/Objective:   [place] Go already active-HHRN-wound care/HHPT-rep Tim aware of [place] orders. D/c in am if stable.                 Action/Plan:d/c home w/HHC.   Expected Discharge Date:   (unknown)               Expected Discharge Plan:  [place]  In-House Referral:     Discharge planning Services  CM Consult  Post Acute Care Choice:  Home Health (HHRN/PT-Active w/[facility]) Choice offered to:  Patient  DME Arranged:    DME Agency:     HH Arranged:  RN, PT HH Agency:  [place]  Status of Service:  Completed, signed off  Medicare Important Message Given:    Date Medicare IM Given:    Medicare IM give by:    Date Additional Medicare IM Given:    Additional Medicare Important Message give by:     If discussed at [place] of Stay Meetings, dates discussed:    Additional Comments:  Dessa Phi, RN 08/17/2015, 11:19 AM

## 2015-08-17 NOTE — Progress Notes (Signed)
Occupational Therapy Treatment Patient Details Name: [name] MRN: [id number] DOB: 1946/02/21 Today's Date: 08/17/2015    History of present illness 70 year old female with PMH dermatomyositis, obstructive sleep apnea and known noncompliance with CPAP, chronic nonproductive cough, bilateral chronic lower extremity lymphedema receiving wound care at home, morbid obesity, presents to [facility] emergency department for evaluation of one week duration of progressive fatigue and illness, body aches and malaise, subjective fevers and chills, poor oral intake; RLE and abdominal erythema; weakness and difficulty with ambulation.   OT comments  Pt up to the bathroom with walker and min assist for transitions in and out of chair/on off toilet. She needs cues for safety with walker as she tends to push it too far in front of her and lets go of walker prematurely. Family present for session. Will continue to follow.   Follow Up Recommendations  Home health OT;Supervision/Assistance - 24 hour    Equipment Recommendations  None recommended by OT    Recommendations for Other Services      Precautions / Restrictions Precautions Precautions: Fall Precaution Comments: wound on bottom Restrictions Weight Bearing Restrictions: No       Mobility Bed Mobility               General bed mobility comments: oob in recliner  Transfers Overall transfer level: Needs assistance Equipment used: Rolling walker (2 wheeled) Transfers: Sit to/from Stand Sit to Stand: Min assist         General transfer comment: assist to rise and steady as well as control descent.    Balance Overall balance assessment: Needs assistance         Standing balance support: Bilateral upper extremity supported;During functional activity Standing balance-Leahy Scale: Poor                     ADL                           Toilet Transfer: Minimal assistance;Ambulation;Comfort height  toilet;Grab bars;RW Armed forces technical officer Details (indicate cue type and reason): pt needed min/mod verbal cues for safety manuevering walker as she tends to push it alittle too far ahead of her. She also needed cues for side stepping over to commode with walker rather than letting go of walker too prematurely.  Toileting- Clothing Manipulation and Hygiene: Total assistance;Sitting/lateral lean Toileting - Clothing Manipulation Details (indicate cue type and reason): pt required asssit wtih periarea hygiene after toileting due to wounds on bottom and pt stating they are painful and hard to clean well.        General ADL Comments: Husband present for session and pleased pt was able to transfer into the bathroom to use commode today. Pt states she still gets tired easily with minimal activity.       Vision                     Perception     Praxis      Cognition   Behavior During Therapy: [facility] for tasks assessed/performed Overall Cognitive Status: Within Functional Limits for tasks assessed                       Extremity/Trunk Assessment               Exercises     Shoulder Instructions       General Comments      Pertinent Vitals/  Pain       Pain Assessment: No/denies pain  Home Living                                          Prior Functioning/Environment              Frequency Min 2X/week     Progress Toward Goals  OT Goals(current goals can now be found in the care plan section)  Progress towards OT goals: Progressing toward goals     Plan Discharge plan remains appropriate    Co-evaluation    PT/OT/SLP Co-Evaluation/Treatment: Yes Reason for Co-Treatment:  (activity tolerance) PT goals addressed during session: Mobility/safety with mobility;Proper use of DME OT goals addressed during session: ADL's and self-care;Proper use of Adaptive equipment and DME      End of Session Equipment Utilized During Treatment:  Rolling walker   Activity Tolerance Patient limited by fatigue   Patient Left in chair;with call bell/phone within reach;with chair alarm set;with family/visitor present   Nurse Communication          Time: [id number] OT Time Calculation (min): 19 min  Charges: OT General Charges $OT Visit: 1 Procedure OT Treatments $Therapeutic Activity: 8-22 mins  Jules Schick   08/17/2015, 12:55 PM

## 2015-08-18 LAB — CULTURE, BLOOD (ROUTINE X 2): Culture: NO GROWTH

## 2015-08-18 MED ORDER — RISAQUAD PO CAPS: 2.0000 | ORAL_CAPSULE | Freq: Every day | ORAL | Status: DC

## 2015-08-18 MED ORDER — AMOXICILLIN-POT CLAVULANATE 875-125 MG PO TABS: 1.0000 | ORAL_TABLET | Freq: Two times a day (BID) | ORAL | Status: AC

## 2015-08-18 MED ORDER — FUROSEMIDE 40 MG PO TABS: 40.0000 mg | ORAL_TABLET | Freq: Two times a day (BID) | ORAL | Status: DC

## 2015-08-18 MED ORDER — AMOXICILLIN-POT CLAVULANATE 875-125 MG PO TABS: 1.0000 | ORAL_TABLET | Freq: Two times a day (BID) | ORAL | Status: DC

## 2015-08-18 MED ORDER — POTASSIUM CHLORIDE CRYS ER 20 MEQ PO TBCR: 20.0000 meq | EXTENDED_RELEASE_TABLET | Freq: Three times a day (TID) | ORAL | Status: DC

## 2015-08-18 NOTE — Progress Notes (Addendum)
[facility]  Patient declined hospital bed/[facility] - could not afford (explained the the bed itself would be $28.00/monthly) and did not have anyone to help move furniture in the home.  Will call office to cancel order.    Linward Headland 08/18/2015, 12:03 PM

## 2015-08-18 NOTE — Discharge Instructions (Signed)
Follow with Primary MD [name], MD in 7 days   Get CBC, CMP, 2 view Chest X ray checked  by Primary MD next visit.    Activity: As tolerated with Full fall precautions use walker/cane & assistance as needed   Disposition Home    Diet: Heart Healthy , with feeding assistance and aspiration precautions.  For Heart failure patients - Check your Weight same time everyday, if you gain over 2 pounds, or you develop in leg swelling, experience more shortness of breath or chest pain, call your Primary MD immediately. Follow Cardiac Low Salt Diet and 1.5 lit/day fluid restriction.   On your next visit with your primary care physician please Get Medicines reviewed and adjusted.   Please request your Prim.MD to go over all Hospital Tests and Procedure/Radiological results at the follow up, please get all Hospital records sent to your Prim MD by signing hospital release before you go home.   If you experience worsening of your admission symptoms, develop shortness of breath, life threatening emergency, suicidal or homicidal thoughts you must seek medical attention immediately by calling 911 or calling your MD immediately  if symptoms less severe.  You Must read complete instructions/literature along with all the possible adverse reactions/side effects for all the Medicines you take and that have been prescribed to you. Take any new Medicines after you have completely understood and accpet all the possible adverse reactions/side effects.   Do not drive, operating heavy machinery, perform activities at heights, swimming or participation in water activities or provide baby sitting services if your were admitted for syncope or siezures until you have seen by Primary MD or a Neurologist and advised to do so again.  Do not drive when taking Pain medications.    Do not take more than prescribed Pain, Sleep and Anxiety Medications  Special Instructions: If you have smoked or chewed Tobacco  in  the last 2 yrs please stop smoking, stop any regular Alcohol  and or any Recreational drug use.  Wear Seat belts while driving.   Please note  You were cared for by a hospitalist during your hospital stay. If you have any questions about your discharge medications or the care you received while you were in the hospital after you are discharged, you can call the unit and asked to speak with the hospitalist on call if the hospitalist that took care of you is not available. Once you are discharged, your primary care physician will handle any further medical issues. Please note that NO REFILLS for any discharge medications will be authorized once you are discharged, as it is imperative that you return to your primary care physician (or establish a relationship with a primary care physician if you do not have one) for your aftercare needs so that they can reassess your need for medications and monitor your lab values.

## 2015-08-18 NOTE — Progress Notes (Signed)
Completed D/C teaching with patient. Answered all questions. Prescriptions given. Patient will be discharged home with home health in stable condition.

## 2015-08-18 NOTE — Care Management Note (Signed)
Case Management Note  Patient Details  Name: [name] MRN: [id number] Date of Birth: [birth year]  Subjective/Objective:  [facility] rep Tim aware of d/c,has [place] orders. Hospital bed  Ordered. [facility] dme rep Pura Spice aware will deliver to home.                  Action/Plan:d/c home w/HHC/DME.   Expected Discharge Date:   (unknown)               Expected Discharge Plan:  [place]  In-House Referral:     Discharge planning Services  CM Consult  Post Acute Care Choice:  Home Health (HHRN/PT-Active w/[facility]) Choice offered to:  Patient  DME Arranged:  Hospital bed DME Agency:  [place]:  RN, PT [facility] Agency:  [place]  Status of Service:  Completed, signed off  Medicare Important Message Given:  Yes Date Medicare IM Given:    Medicare IM give by:    Date Additional Medicare IM Given:    Additional Medicare Important Message give by:     If discussed at [place] of Stay Meetings, dates discussed:    Additional Comments:  Dessa Phi, RN 08/18/2015, 11:38 AM

## 2015-08-18 NOTE — Discharge Summary (Signed)
[name], is a 70 y.o. female  DOB 05/24/1946  MRN [id number].  Admission date:  08/13/2015  Admitting Physician  Theodis Blaze, MD  Discharge Date:  08/18/2015   Primary MD  [name], MD  Recommendations for primary care physician for things to follow:  - Please CBC, BMP during next visit. - Continue with by mouth Augmentin for another 9 days to finish total of 14 days of antibiotic treatment. - Wound care has been arranged for patient at home   Admission Diagnosis  Cellulitis of right lower extremity [L03.115] AKI (acute kidney injury) ([place]) [N17.9]   Discharge Diagnosis  Cellulitis of right lower extremity [L03.115] AKI (acute kidney injury) ([place]) [N17.9]    Principal Problem:   Sepsis due to cellulitis ([place]) Active Problems:   CAD, NATIVE VESSEL   LEG EDEMA, BILATERAL   Morbid obesity ([place])   Chronic anticoagulation   PAF (paroxysmal atrial fibrillation) (HCC)   Essential hypertension   Cellulitis, bilateral LE but R>L, abd wall    Acute kidney injury ([place])   Hypokalemia   History of iron deficiency anemia   Thrombocytopenia (HCC)   Sepsis ([place])      Past Medical History  Diagnosis Date  . PAF (paroxysmal atrial fibrillation) (HCC)     a. CHA2DS2VASc = 5-->poor OAC candidate 2/2 falls.  . Essential hypertension   . Bilateral leg edema   . Internal hemorrhoids   . GERD (gastroesophageal reflux disease)   . Hiatal hernia   . Dermatomyositis ([place])   . Osteoarthritis   . Hypothyroidism   . Sleep apnea   . History of pancreatitis   . Chronic diastolic heart failure ([place])   . Shingles   . Gall stones     a. 08/2014 Abd U/S:  Large gallstone measuring 4.2 cm.  . Gout   . Deviated septum   . Hyperlipidemia   . Morbid obesity ([place])   . Continuous urine leakage   . CAD (coronary artery disease)     a. LHC 03/2006: EF 65%, mid LAD 40-50%, ostial D1 70-80%, normal  circumflex, normal RCA;  b. Lexiscan Myoview 09/2010: No ischemia or scar, EF 75%;  c. Lex MV 5/14:  Normal, no ischemia, EF 71%  . Chronic diastolic CHF (congestive heart failure) ([place])     a. Echo 8/14:  Mild LVH, EF 55-65%, normal wall motion, Tr AI, mild MR, mod TR, PASP 45;  b. 12/2014 Echo: EF 60-65%, nl wall motion, mildly dil RA/LA, PASP 48mmHg.  Marland Kitchen History of PFTs     a. PFTs 10/2012: normal.   . Cellulitis of right leg   . Spinal cord compression ([place]) 02/21/2014    Past Surgical History  Procedure Laterality Date  . Total abdominal hysterectomy    . Nasal septum surgery    . Back surgery  Sept 7 and May 30, 2014       History of present illness and  Hospital Course:     Kindly see H&P for history of present illness  and admission details, please review complete Labs, Consult reports and Test reports for all details in brief  HPI  from the history and physical done on the day of admission 08/13/2015 Patient is 70 year old female with multiple and complex medical history including dermatomyositis for which she is taking Plaquenil, Methotrexate, and low-dose prednisone, obstructive sleep apnea and known noncompliance with CPAP, chronic nonproductive cough, bilateral chronic lower extremity lymphedema receiving wound care at home, morbid obesity, presents to [facility] emergency department for evaluation of multiple concerns. Patient explains one week duration of progressive fatigue and illness, body aches and malaise, subjective fevers and chills, poor oral intake. She explains she has wound care nurse who takes care of multiple wounds including abdominal area fungal infection, lower extremity wounds and was told that her right lower extremity looked more red and swollen, warm to touch and patient noted more tenderness with wound care in the right lower extremity. Patient was thus referred to emergency department for further evaluation. Patient reports also chronic nonproductive cough and  dyspnea with even minimal exertion but goes on to explain that this has been fairly chronic and at her baseline. Patient denies chest pain, no specific abdominal or urinary concerns other than noticing erythema and warmth to touch in the lower part of her abdominal area. She explains she has had fungal infection around the abdominal creases for which she has been seen by wound care nurse MWF.  In the emergency department patient noted to be hemodynamically stable, vital signs notable for T 102F, HR > 90 and < 120, initial blood pressure 107/53. Blood work notable for WBC 15 K, hg 10, plt 127, K 3.2, Cr 1.52. Source of the fever determined to be most likely cellulitis in multiple areas but chest x-ray was also also concerning for underlying pneumonia versus atypical infection. Patient was started on vancomycin and TRH asked to admit for further evaluation and management. Telemetry bed was considered appropriate based on hemodynamic stability and no cardiac or respiratory compromise at the time of the admission. Patient determined to be appropriate for inpatient hospitalization given complexity of acute medical issues requiring further evaluation and IV medications.   Hospital Course  70 year old female with history of dermatomyositis for which she is taking Plaquenil, Methotrexate, and low-dose prednisone, obstructive sleep apnea noncompliant with CPAP, chronic nonproductive cough, bilateral chronic lower extremity lymphedema receiving wound care at home, morbid obesity, presents to [facility] with lower ext cellulits  Sepsis due to cellulitis /PNA/Streptococcus bacteremia - admitted with sepsis as fever off 102 F, WBC 15 K on admission .source most likely cellulitis in multiple areas, UTI, pneumonia, and Streptococcus bacteremia - Influenza negative   Cellulitis, bilateral LE but R>L, abd wall  - Initially on vancomycin and Zosyn,Blood culture growing Streptococcus bacteremia, nvancomycin stopped  3/5, continued on IV Zosyn, , repeat blood culture on 3/5 with no growth to date , patient will need to finish total of 14 days of antibiotic, so we will discharge on another 9 days on oral Augmentin as discussed with Dr. Johnnye Sima  - Wound care team consult appreciated, - Case management consulted to continue wound care at home  CAP -CT chest with evidence of left lower lobe pneumonia. - Legionella antigen negative, strep pneumonia antigen negative -Vancomycin stopptreated with IV Zosyn  UTI - Treated  with Zosyn, urine culture growing 20,000 colonies of enterococcus species  Streptococcus bacteremia - Likely secondary to wound infection/cellulitTreated with IV Zosyn, to finish another 9 days of oral Augmentin for total  of 14 days of treatment    Acute kidney injury ([place]) - Appears to be prerenal in etiology, improving with IV fluids, will decrease patient's Lasix on discharge, will decrease by half, from 80 mg twice a day to 40 mg twice a day .   LEG EDEMA, BILATERAL - Chronic, limiting physical mobility  - Wound care consultation    Morbid obesity ([place]) - BMI greater than 40    PAF (paroxysmal atrial fibrillation) ([place]) - Followed By Dr. Rayann Heman, currently not a candidate for anticoagulation secondary to falls. - Resumed on beta blocker giving her improved blood pressure   Thrombocytopenia (HCC) - Mild, likely reactive, monitor   CAD, NATIVE VESSEL - No chest pain at this time    Essential hypertension - Reasonable blood pressure on admission  - Low on hold initially giving soft blood pressure, currently blood pressure acceptable, resumed back on metoprolol.   Hypokalemia - Repleted , lower oral supplement at home as we did lower her Lasix dose    History of iron deficiency anemia - No signs of bleeding,  Fungal rash in abdominal folds - Continue with nystatin powder    Discharge Condition:  stable   Follow UP  Follow-up Information    Follow up  with [facility].   Why:  HHRN/HHPT   Contact information:   [address] [place] [address] [phone]       Follow up with [facility].   Why:  hospital bed   Contact information:   [address] [place] [address] [phone]       Follow up with [name], MD. Schedule an appointment as soon as possible for a visit in 1 week.   Specialty:  Internal Medicine   Contact information:   [place], [place] [address] [phone]         Discharge Instructions  and  Discharge Medications     Discharge Instructions    Diet - low sodium heart healthy    Complete by:  As directed      Discharge instructions    Complete by:  As directed   Follow with Primary MD [name], MD in 7 days   Get CBC, CMP, 2 view Chest X ray checked  by Primary MD next visit.    Activity: As tolerated with Full fall precautions use walker/cane & assistance as needed   Disposition Home    Diet: Heart Healthy , with feeding assistance and aspiration precautions.  For Heart failure patients - Check your Weight same time everyday, if you gain over 2 pounds, or you develop in leg swelling, experience more shortness of breath or chest pain, call your Primary MD immediately. Follow Cardiac Low Salt Diet and 1.5 lit/day fluid restriction.   On your next visit with your primary care physician please Get Medicines reviewed and adjusted.   Please request your Prim.MD to go over all Hospital Tests and Procedure/Radiological results at the follow up, please get all Hospital records sent to your Prim MD by signing hospital release before you go home.   If you experience worsening of your admission symptoms, develop shortness of breath, life threatening emergency, suicidal or homicidal thoughts you must seek medical attention immediately by calling 911 or calling your MD immediately  if symptoms less severe.  You  Must read complete instructions/literature along with all the possible adverse reactions/side effects for all the Medicines you take and that have been  prescribed to you. Take any new Medicines after you have completely understood and accpet all the possible adverse reactions/side effects.   Do not drive, operating heavy machinery, perform activities at heights, swimming or participation in water activities or provide baby sitting services if your were admitted for syncope or siezures until you have seen by Primary MD or a Neurologist and advised to do so again.  Do not drive when taking Pain medications.    Do not take more than prescribed Pain, Sleep and Anxiety Medications  Special Instructions: If you have smoked or chewed Tobacco  in the last 2 yrs please stop smoking, stop any regular Alcohol  and or any Recreational drug use.  Wear Seat belts while driving.   Please note  You were cared for by a hospitalist during your hospital stay. If you have any questions about your discharge medications or the care you received while you were in the hospital after you are discharged, you can call the unit and asked to speak with the hospitalist on call if the hospitalist that took care of you is not available. Once you are discharged, your primary care physician will handle any further medical issues. Please note that NO REFILLS for any discharge medications will be authorized once you are discharged, as it is imperative that you return to your primary care physician (or establish a relationship with a primary care physician if you do not have one) for your aftercare needs so that they can reassess your need for medications and monitor your lab values.     Increase activity slowly    Complete by:  As directed             Medication List    TAKE these medications        acetaminophen 500 MG tablet  Commonly known as:  TYLENOL  Take 500-1,000 mg by mouth every 6 (six) hours as needed for mild  pain or fever.     acidophilus Caps capsule  Take 2 capsules by mouth daily.     albuterol (2.5 MG/3ML) 0.083% nebulizer solution  Commonly known as:  PROVENTIL  Take 3 mLs (2.5 mg total) by nebulization every 6 (six) hours as needed for wheezing or shortness of breath.     alendronate 70 MG tablet  Commonly known as:  FOSAMAX  Take 1 tablet by mouth every Tuesday.     amoxicillin-clavulanate 875-125 MG tablet  Commonly known as:  AUGMENTIN  Take 1 tablet by mouth 2 (two) times daily.     aspirin EC 325 MG tablet  Take 325 mg by mouth daily.     benzonatate 100 MG capsule  Commonly known as:  TESSALON  Take 1 capsule (100 mg total) by mouth 3 (three) times daily as needed for cough.     cetirizine 10 MG tablet  Commonly known as:  ZYRTEC  Take 10 mg by mouth daily.     fluticasone 110 MCG/ACT inhaler  Commonly known as:  FLOVENT HFA  Inhale 2 puffs into the lungs 2 (two) times daily.     fluticasone 50 MCG/ACT nasal spray  Commonly known as:  FLONASE  Place 2 sprays into both nostrils daily.     FOLIC ACID PO  Take 1 tablet by mouth 4 (four) times daily.     furosemide 40 MG tablet  Commonly known as:  LASIX  Take 1 tablet (40 mg total) by mouth 2 (two) times daily.     hydroxychloroquine 200 MG  tablet  Commonly known as:  PLAQUENIL  Take 200 mg by mouth 2 (two) times daily.     levothyroxine 137 MCG tablet  Commonly known as:  SYNTHROID, LEVOTHROID  Take 1 tablet by mouth daily before breakfast.     methotrexate 2.5 MG tablet  Commonly known as:  RHEUMATREX  Take 5 tablets by mouth every Wednesday.     metoprolol succinate 25 MG 24 hr tablet  Commonly known as:  TOPROL-XL  Take 1 tablet by mouth  daily     multivitamin tablet  Take 1 tablet by mouth daily.     nitroGLYCERIN 0.4 MG SL tablet  Commonly known as:  NITROSTAT  Place 1 tablet (0.4 mg total) under the tongue every 5 (five) minutes as needed.     nystatin 100000 UNIT/GM Powd  Apply 1 g  topically 2 (two) times daily. Applies to stomach     OVER THE COUNTER MEDICATION  Place 1 drop into both eyes daily as needed (for dry eyes. Advanced eye relief).     pantoprazole 40 MG tablet  Commonly known as:  PROTONIX  Take 1 tablet (40 mg total) by mouth 2 (two) times daily before a meal.     potassium chloride SA 20 MEQ tablet  Commonly known as:  KLOR-CON M20  Take 1 tablet (20 mEq total) by mouth 3 (three) times daily.     predniSONE 5 MG tablet  Commonly known as:  DELTASONE  Take 5 mg by mouth daily with breakfast.     Vitamin D3 2000 units Tabs  Take 2,000 Units by mouth daily.          Diet and Activity recommendation: See Discharge Instructions above   Consults obtained -  Discussed with ID by phone    Major procedures and Radiology Reports - PLEASE review detailed and final reports for all details, in brief -      Dg Chest 2 View  08/13/2015  CLINICAL DATA:  New cough, congestion, shortness of breath and fever since Sunday. Increased weakness. EXAM: CHEST  2 VIEW COMPARISON:  Chest x-rays dated 06/12/2015 and 03/13/2015. Comparison also made to chest CT dated 07/07/2015. FINDINGS: Heart size is within normal limits. Overall cardiomediastinal silhouette is normal in size and configuration. Peribronchial thickening and mild bronchiectasis appears stable, compatible with findings on recent chest CT. Lungs otherwise clear. No confluent opacity to suggest a consolidating pneumonia. No pleural effusion. No pneumothorax. Degenerative changes are again noted throughout the kyphotic thoracolumbar spine. Stable compression fracture deformities are seen at the thoracolumbar junction status post earlier vertebroplasties. No acute osseous abnormality seen. IMPRESSION: 1. No acute findings. No evidence of pneumonia. No pleural effusion. Heart size is normal. 2. Chronic peribronchial thickening and mild bronchiectasis, compatible with findings described on recent chest CT report  which suggested the possibility of a chronic indolent atypical infectious process such as MAI. Electronically Signed   By: Franki Cabot M.D.   On: 08/13/2015 10:55   Ct Chest Wo Contrast  08/13/2015  CLINICAL DATA:  Cough and weakness EXAM: CT CHEST WITHOUT CONTRAST TECHNIQUE: Multidetector CT imaging of the chest was performed following the standard protocol without IV contrast. COMPARISON:  Plain film from earlier in the same day, 07/07/2015. FINDINGS: Lungs are well aerated bilaterally. Some patchy changes are noted in the left lower lobe new from the prior exam consistent with acute infiltrate. A few small subpleural nodules are noted stable from the previous exam. The thoracic inlet is within normal limits.  No hilar or mediastinal adenopathy is noted. Mild aortic calcifications are seen as well as coronary calcifications stable from the previous exam. The visualized upper abdomen demonstrates a large central gallstone measuring approximately 4 cm in greatest dimension. No other focal abnormality is seen. Changes of prior vertebral augmentation are noted at T11 and T12. IMPRESSION: New left lower lobe mild infiltrate. Stable subpleural nodules. If the patient is at high risk for bronchogenic carcinoma, follow-up chest CT at 1 year is recommended. If the patient is at low risk, no follow-up is needed. This recommendation follows the consensus statement: Guidelines for Management of Small Pulmonary Nodules Detected on CT Scans: A Statement from the [place] as published in Radiology 2005; 237:395-400. No other focal abnormality is noted. Electronically Signed   By: Inez Catalina M.D.   On: 08/13/2015 14:36    Micro Results     Recent Results (from the past 240 hour(s))  Culture, blood (x 2)     Status: None   Collection Time: 08/13/15 10:18 AM  Result Value Ref Range Status   Specimen Description BLOOD LEFT WRIST  Final   Special Requests BOTTLES DRAWN AEROBIC AND ANAEROBIC  5ML  Final    Culture  Setup Time   Final    GRAM POSITIVE COCCI IN CHAINS IN BOTH AEROBIC AND ANAEROBIC BOTTLES CRITICAL RESULT CALLED TO, READ BACK BY AND VERIFIED WITH: J ASARO,RN @0550  08/14/15 MKELLY    Culture   Final    STREPTOCOCCUS GROUP G Performed at [facility]    Report Status 08/16/2015 FINAL  Final   Organism ID, Bacteria STREPTOCOCCUS GROUP G  Final      Susceptibility   Streptococcus group g - MIC*    CLINDAMYCIN <=0.25 SENSITIVE Sensitive     AMPICILLIN <=0.25 SENSITIVE Sensitive     ERYTHROMYCIN <=0.12 SENSITIVE Sensitive     VANCOMYCIN 0.5 SENSITIVE Sensitive     CEFTRIAXONE <=0.12 SENSITIVE Sensitive     LEVOFLOXACIN 0.5 SENSITIVE Sensitive     * STREPTOCOCCUS GROUP G  Respiratory virus panel     Status: None   Collection Time: 08/13/15  1:54 PM  Result Value Ref Range Status   Source - RVPAN NASOPHARYNGEAL SWAB  Corrected   Respiratory Syncytial Virus A Negative Negative Final   Respiratory Syncytial Virus B Negative Negative Final   Influenza A Negative Negative Final   Influenza B Negative Negative Final   Parainfluenza 1 Negative Negative Final   Parainfluenza 2 Negative Negative Final   Parainfluenza 3 Negative Negative Final   Metapneumovirus Negative Negative Final   Rhinovirus Negative Negative Final   Adenovirus Negative Negative Final    Comment: (NOTE) Performed At: [facility] [address] [place], [place] [id number] Lindon Romp MD [id number]   Culture, blood (x 2)     Status: None (Preliminary result)   Collection Time: 08/13/15  3:49 PM  Result Value Ref Range Status   Specimen Description BLOOD LEFT HAND  Final   Special Requests IN PEDIATRIC BOTTLE  0.5CC  Final   Culture   Final    NO GROWTH 4 DAYS Performed at [facility]    Report Status PENDING  Incomplete  Urine culture     Status: None   Collection Time: 08/13/15  7:25 PM  Result Value Ref Range Status   Specimen Description URINE, CLEAN CATCH  Final     Special Requests NONE  Final   Culture   Final    20,000 COLONIES/mL  ENTEROCOCCUS SPECIES Performed at [facility]    Report Status 08/16/2015 FINAL  Final   Organism ID, Bacteria ENTEROCOCCUS SPECIES  Final      Susceptibility   Enterococcus species - MIC*    AMPICILLIN <=2 SENSITIVE Sensitive     LEVOFLOXACIN 0.5 SENSITIVE Sensitive     NITROFURANTOIN <=16 SENSITIVE Sensitive     VANCOMYCIN 1 SENSITIVE Sensitive     * 20,000 COLONIES/mL ENTEROCOCCUS SPECIES  C difficile quick scan w PCR reflex     Status: None   Collection Time: 08/15/15  8:05 AM  Result Value Ref Range Status   C Diff antigen NEGATIVE NEGATIVE Final   C Diff toxin NEGATIVE NEGATIVE Final   C Diff interpretation Negative for toxigenic C. difficile  Final  Culture, blood (Routine X 2) w Reflex to ID Panel     Status: None (Preliminary result)   Collection Time: 08/16/15  8:56 AM  Result Value Ref Range Status   Specimen Description BLOOD RIGHT HAND  Final   Special Requests BOTTLES DRAWN AEROBIC ONLY 5 CC  Final   Culture   Final    NO GROWTH 1 DAY Performed at [facility]    Report Status PENDING  Incomplete  Culture, blood (Routine X 2) w Reflex to ID Panel     Status: None (Preliminary result)   Collection Time: 08/16/15  9:52 AM  Result Value Ref Range Status   Specimen Description BLOOD LEFT ARM  Final   Special Requests BOTTLES DRAWN AEROBIC ONLY 5 CC  Final   Culture   Final    NO GROWTH 1 DAY Performed at [facility]    Report Status PENDING  Incomplete       Today   Subjective:   [name] today has no headache,no chest abdominal pain,no new weakness tingling or numbness, feels much better wants to go home today.   Objective:   Blood pressure 113/47, pulse 81, temperature 97.6 F (36.4 C), temperature source Oral, resp. rate 18, height 4\' 11"  (1.499 m), weight 98.431 kg (217 lb), SpO2 100 %.   Intake/Output Summary (Last 24 hours) at 08/18/15 1218 Last  data filed at 08/18/15 1025  Gross per 24 hour  Intake    870 ml  Output      1 ml  Net    869 ml    Exam Awake Alert, Oriented X 3, [place].AT,PERRAL Supple Neck,No JVD, No cervical lymphadenopathy appriciated.  Symmetrical Chest wall movement, Good air movement bilaterally, CTAB RRR,No Gallops,Rubs or new Murmurs, No Parasternal Heave +ve B.Sounds, Abd Soft, No tenderness, obese , fungal rash and abdominal folds  No Cyanosis, significant bilateral lower extremity lymphedema.  Data Review   CBC w Diff: Lab Results  Component Value Date   WBC 7.0 08/17/2015   HGB 10.1* 08/17/2015   HCT 31.7* 08/17/2015   PLT 146* 08/17/2015   LYMPHOPCT 6 08/13/2015   MONOPCT 5 08/13/2015   EOSPCT 0 08/13/2015   BASOPCT 0 08/13/2015    CMP: Lab Results  Component Value Date   NA 137 08/17/2015   K 4.0 08/17/2015   CL 111 08/17/2015   CO2 21* 08/17/2015   BUN 14 08/17/2015   CREATININE 1.08* 08/17/2015   PROT 6.7 08/21/2014   ALBUMIN 2.7* 08/21/2014   BILITOT 1.0 08/21/2014   ALKPHOS 73 08/21/2014   AST 93* 08/21/2014   ALT 62* 08/21/2014  .   Total Time in preparing paper work, data evaluation and todays  exam - 35 minutes  Kiyara Bouffard M.D on 08/18/2015 at 12:18 PM  [facility]   Office  [phone]

## 2015-08-19 DIAGNOSIS — Z7982 Long term (current) use of aspirin: Secondary | ICD-10-CM | POA: Diagnosis not present

## 2015-08-19 DIAGNOSIS — R600 Localized edema: Secondary | ICD-10-CM | POA: Diagnosis not present

## 2015-08-19 DIAGNOSIS — R238 Other skin changes: Secondary | ICD-10-CM | POA: Diagnosis not present

## 2015-08-19 DIAGNOSIS — B372 Candidiasis of skin and nail: Secondary | ICD-10-CM | POA: Diagnosis not present

## 2015-08-19 DIAGNOSIS — I4891 Unspecified atrial fibrillation: Secondary | ICD-10-CM | POA: Diagnosis not present

## 2015-08-19 DIAGNOSIS — K760 Fatty (change of) liver, not elsewhere classified: Secondary | ICD-10-CM | POA: Diagnosis not present

## 2015-08-19 DIAGNOSIS — I10 Essential (primary) hypertension: Secondary | ICD-10-CM | POA: Diagnosis not present

## 2015-08-19 DIAGNOSIS — Z7951 Long term (current) use of inhaled steroids: Secondary | ICD-10-CM | POA: Diagnosis not present

## 2015-08-19 DIAGNOSIS — Z4800 Encounter for change or removal of nonsurgical wound dressing: Secondary | ICD-10-CM | POA: Diagnosis not present

## 2015-08-20 DIAGNOSIS — I10 Essential (primary) hypertension: Secondary | ICD-10-CM | POA: Diagnosis not present

## 2015-08-20 DIAGNOSIS — Z7982 Long term (current) use of aspirin: Secondary | ICD-10-CM | POA: Diagnosis not present

## 2015-08-20 DIAGNOSIS — K760 Fatty (change of) liver, not elsewhere classified: Secondary | ICD-10-CM | POA: Diagnosis not present

## 2015-08-20 DIAGNOSIS — I4891 Unspecified atrial fibrillation: Secondary | ICD-10-CM | POA: Diagnosis not present

## 2015-08-20 DIAGNOSIS — B372 Candidiasis of skin and nail: Secondary | ICD-10-CM | POA: Diagnosis not present

## 2015-08-20 DIAGNOSIS — R600 Localized edema: Secondary | ICD-10-CM | POA: Diagnosis not present

## 2015-08-20 DIAGNOSIS — Z7951 Long term (current) use of inhaled steroids: Secondary | ICD-10-CM | POA: Diagnosis not present

## 2015-08-20 DIAGNOSIS — R238 Other skin changes: Secondary | ICD-10-CM | POA: Diagnosis not present

## 2015-08-20 DIAGNOSIS — Z4800 Encounter for change or removal of nonsurgical wound dressing: Secondary | ICD-10-CM | POA: Diagnosis not present

## 2015-08-21 DIAGNOSIS — B372 Candidiasis of skin and nail: Secondary | ICD-10-CM | POA: Diagnosis not present

## 2015-08-21 DIAGNOSIS — R238 Other skin changes: Secondary | ICD-10-CM | POA: Diagnosis not present

## 2015-08-21 DIAGNOSIS — R600 Localized edema: Secondary | ICD-10-CM | POA: Diagnosis not present

## 2015-08-21 DIAGNOSIS — Z4800 Encounter for change or removal of nonsurgical wound dressing: Secondary | ICD-10-CM | POA: Diagnosis not present

## 2015-08-21 DIAGNOSIS — K760 Fatty (change of) liver, not elsewhere classified: Secondary | ICD-10-CM | POA: Diagnosis not present

## 2015-08-21 DIAGNOSIS — I10 Essential (primary) hypertension: Secondary | ICD-10-CM | POA: Diagnosis not present

## 2015-08-21 DIAGNOSIS — Z7982 Long term (current) use of aspirin: Secondary | ICD-10-CM | POA: Diagnosis not present

## 2015-08-21 DIAGNOSIS — I4891 Unspecified atrial fibrillation: Secondary | ICD-10-CM | POA: Diagnosis not present

## 2015-08-21 DIAGNOSIS — Z7951 Long term (current) use of inhaled steroids: Secondary | ICD-10-CM | POA: Diagnosis not present

## 2015-08-21 LAB — CULTURE, BLOOD (ROUTINE X 2)
Culture: NO GROWTH
Culture: NO GROWTH

## 2015-08-24 DIAGNOSIS — L0390 Cellulitis, unspecified: Secondary | ICD-10-CM | POA: Diagnosis not present

## 2015-08-24 DIAGNOSIS — D7589 Other specified diseases of blood and blood-forming organs: Secondary | ICD-10-CM | POA: Diagnosis not present

## 2015-08-24 DIAGNOSIS — M810 Age-related osteoporosis without current pathological fracture: Secondary | ICD-10-CM | POA: Diagnosis not present

## 2015-08-24 DIAGNOSIS — I5032 Chronic diastolic (congestive) heart failure: Secondary | ICD-10-CM | POA: Diagnosis not present

## 2015-08-24 DIAGNOSIS — M109 Gout, unspecified: Secondary | ICD-10-CM | POA: Diagnosis not present

## 2015-08-25 DIAGNOSIS — R238 Other skin changes: Secondary | ICD-10-CM | POA: Diagnosis not present

## 2015-08-25 DIAGNOSIS — Z7982 Long term (current) use of aspirin: Secondary | ICD-10-CM | POA: Diagnosis not present

## 2015-08-25 DIAGNOSIS — I4891 Unspecified atrial fibrillation: Secondary | ICD-10-CM | POA: Diagnosis not present

## 2015-08-25 DIAGNOSIS — Z7951 Long term (current) use of inhaled steroids: Secondary | ICD-10-CM | POA: Diagnosis not present

## 2015-08-25 DIAGNOSIS — B372 Candidiasis of skin and nail: Secondary | ICD-10-CM | POA: Diagnosis not present

## 2015-08-25 DIAGNOSIS — K760 Fatty (change of) liver, not elsewhere classified: Secondary | ICD-10-CM | POA: Diagnosis not present

## 2015-08-25 DIAGNOSIS — S31109A Unspecified open wound of abdominal wall, unspecified quadrant without penetration into peritoneal cavity, initial encounter: Secondary | ICD-10-CM | POA: Diagnosis not present

## 2015-08-25 DIAGNOSIS — Z4800 Encounter for change or removal of nonsurgical wound dressing: Secondary | ICD-10-CM | POA: Diagnosis not present

## 2015-08-25 DIAGNOSIS — I10 Essential (primary) hypertension: Secondary | ICD-10-CM | POA: Diagnosis not present

## 2015-08-25 DIAGNOSIS — R600 Localized edema: Secondary | ICD-10-CM | POA: Diagnosis not present

## 2015-08-26 DIAGNOSIS — R600 Localized edema: Secondary | ICD-10-CM | POA: Diagnosis not present

## 2015-08-26 DIAGNOSIS — I10 Essential (primary) hypertension: Secondary | ICD-10-CM | POA: Diagnosis not present

## 2015-08-26 DIAGNOSIS — K760 Fatty (change of) liver, not elsewhere classified: Secondary | ICD-10-CM | POA: Diagnosis not present

## 2015-08-26 DIAGNOSIS — B372 Candidiasis of skin and nail: Secondary | ICD-10-CM | POA: Diagnosis not present

## 2015-08-26 DIAGNOSIS — Z7951 Long term (current) use of inhaled steroids: Secondary | ICD-10-CM | POA: Diagnosis not present

## 2015-08-26 DIAGNOSIS — R238 Other skin changes: Secondary | ICD-10-CM | POA: Diagnosis not present

## 2015-08-26 DIAGNOSIS — Z7982 Long term (current) use of aspirin: Secondary | ICD-10-CM | POA: Diagnosis not present

## 2015-08-26 DIAGNOSIS — Z4800 Encounter for change or removal of nonsurgical wound dressing: Secondary | ICD-10-CM | POA: Diagnosis not present

## 2015-08-26 DIAGNOSIS — I4891 Unspecified atrial fibrillation: Secondary | ICD-10-CM | POA: Diagnosis not present

## 2015-08-27 DIAGNOSIS — K760 Fatty (change of) liver, not elsewhere classified: Secondary | ICD-10-CM | POA: Diagnosis not present

## 2015-08-27 DIAGNOSIS — Z7982 Long term (current) use of aspirin: Secondary | ICD-10-CM | POA: Diagnosis not present

## 2015-08-27 DIAGNOSIS — B372 Candidiasis of skin and nail: Secondary | ICD-10-CM | POA: Diagnosis not present

## 2015-08-27 DIAGNOSIS — Z7951 Long term (current) use of inhaled steroids: Secondary | ICD-10-CM | POA: Diagnosis not present

## 2015-08-27 DIAGNOSIS — R600 Localized edema: Secondary | ICD-10-CM | POA: Diagnosis not present

## 2015-08-27 DIAGNOSIS — I4891 Unspecified atrial fibrillation: Secondary | ICD-10-CM | POA: Diagnosis not present

## 2015-08-27 DIAGNOSIS — I10 Essential (primary) hypertension: Secondary | ICD-10-CM | POA: Diagnosis not present

## 2015-08-27 DIAGNOSIS — R238 Other skin changes: Secondary | ICD-10-CM | POA: Diagnosis not present

## 2015-08-27 DIAGNOSIS — Z4800 Encounter for change or removal of nonsurgical wound dressing: Secondary | ICD-10-CM | POA: Diagnosis not present

## 2015-08-28 DIAGNOSIS — B372 Candidiasis of skin and nail: Secondary | ICD-10-CM | POA: Diagnosis not present

## 2015-08-28 DIAGNOSIS — I4891 Unspecified atrial fibrillation: Secondary | ICD-10-CM | POA: Diagnosis not present

## 2015-08-28 DIAGNOSIS — I10 Essential (primary) hypertension: Secondary | ICD-10-CM | POA: Diagnosis not present

## 2015-08-28 DIAGNOSIS — R238 Other skin changes: Secondary | ICD-10-CM | POA: Diagnosis not present

## 2015-08-28 DIAGNOSIS — R600 Localized edema: Secondary | ICD-10-CM | POA: Diagnosis not present

## 2015-08-28 DIAGNOSIS — Z4800 Encounter for change or removal of nonsurgical wound dressing: Secondary | ICD-10-CM | POA: Diagnosis not present

## 2015-08-28 DIAGNOSIS — K760 Fatty (change of) liver, not elsewhere classified: Secondary | ICD-10-CM | POA: Diagnosis not present

## 2015-08-28 DIAGNOSIS — Z7982 Long term (current) use of aspirin: Secondary | ICD-10-CM | POA: Diagnosis not present

## 2015-08-28 DIAGNOSIS — Z7951 Long term (current) use of inhaled steroids: Secondary | ICD-10-CM | POA: Diagnosis not present

## 2015-08-31 DIAGNOSIS — Z7982 Long term (current) use of aspirin: Secondary | ICD-10-CM | POA: Diagnosis not present

## 2015-08-31 DIAGNOSIS — Z4800 Encounter for change or removal of nonsurgical wound dressing: Secondary | ICD-10-CM | POA: Diagnosis not present

## 2015-08-31 DIAGNOSIS — K760 Fatty (change of) liver, not elsewhere classified: Secondary | ICD-10-CM | POA: Diagnosis not present

## 2015-08-31 DIAGNOSIS — R238 Other skin changes: Secondary | ICD-10-CM | POA: Diagnosis not present

## 2015-08-31 DIAGNOSIS — B372 Candidiasis of skin and nail: Secondary | ICD-10-CM | POA: Diagnosis not present

## 2015-08-31 DIAGNOSIS — Z7951 Long term (current) use of inhaled steroids: Secondary | ICD-10-CM | POA: Diagnosis not present

## 2015-08-31 DIAGNOSIS — R600 Localized edema: Secondary | ICD-10-CM | POA: Diagnosis not present

## 2015-08-31 DIAGNOSIS — I4891 Unspecified atrial fibrillation: Secondary | ICD-10-CM | POA: Diagnosis not present

## 2015-08-31 DIAGNOSIS — I10 Essential (primary) hypertension: Secondary | ICD-10-CM | POA: Diagnosis not present

## 2015-09-01 DIAGNOSIS — Z4800 Encounter for change or removal of nonsurgical wound dressing: Secondary | ICD-10-CM | POA: Diagnosis not present

## 2015-09-01 DIAGNOSIS — K760 Fatty (change of) liver, not elsewhere classified: Secondary | ICD-10-CM | POA: Diagnosis not present

## 2015-09-01 DIAGNOSIS — B372 Candidiasis of skin and nail: Secondary | ICD-10-CM | POA: Diagnosis not present

## 2015-09-01 DIAGNOSIS — I4891 Unspecified atrial fibrillation: Secondary | ICD-10-CM | POA: Diagnosis not present

## 2015-09-01 DIAGNOSIS — R238 Other skin changes: Secondary | ICD-10-CM | POA: Diagnosis not present

## 2015-09-01 DIAGNOSIS — I10 Essential (primary) hypertension: Secondary | ICD-10-CM | POA: Diagnosis not present

## 2015-09-01 DIAGNOSIS — R600 Localized edema: Secondary | ICD-10-CM | POA: Diagnosis not present

## 2015-09-01 DIAGNOSIS — Z7982 Long term (current) use of aspirin: Secondary | ICD-10-CM | POA: Diagnosis not present

## 2015-09-01 DIAGNOSIS — Z7951 Long term (current) use of inhaled steroids: Secondary | ICD-10-CM | POA: Diagnosis not present

## 2015-09-02 DIAGNOSIS — B372 Candidiasis of skin and nail: Secondary | ICD-10-CM | POA: Diagnosis not present

## 2015-09-02 DIAGNOSIS — I4891 Unspecified atrial fibrillation: Secondary | ICD-10-CM | POA: Diagnosis not present

## 2015-09-02 DIAGNOSIS — Z4800 Encounter for change or removal of nonsurgical wound dressing: Secondary | ICD-10-CM | POA: Diagnosis not present

## 2015-09-02 DIAGNOSIS — K760 Fatty (change of) liver, not elsewhere classified: Secondary | ICD-10-CM | POA: Diagnosis not present

## 2015-09-02 DIAGNOSIS — R600 Localized edema: Secondary | ICD-10-CM | POA: Diagnosis not present

## 2015-09-02 DIAGNOSIS — Z7951 Long term (current) use of inhaled steroids: Secondary | ICD-10-CM | POA: Diagnosis not present

## 2015-09-02 DIAGNOSIS — Z7982 Long term (current) use of aspirin: Secondary | ICD-10-CM | POA: Diagnosis not present

## 2015-09-02 DIAGNOSIS — R238 Other skin changes: Secondary | ICD-10-CM | POA: Diagnosis not present

## 2015-09-02 DIAGNOSIS — I10 Essential (primary) hypertension: Secondary | ICD-10-CM | POA: Diagnosis not present

## 2015-09-03 DIAGNOSIS — Z7951 Long term (current) use of inhaled steroids: Secondary | ICD-10-CM | POA: Diagnosis not present

## 2015-09-03 DIAGNOSIS — I4891 Unspecified atrial fibrillation: Secondary | ICD-10-CM | POA: Diagnosis not present

## 2015-09-03 DIAGNOSIS — R238 Other skin changes: Secondary | ICD-10-CM | POA: Diagnosis not present

## 2015-09-03 DIAGNOSIS — Z7982 Long term (current) use of aspirin: Secondary | ICD-10-CM | POA: Diagnosis not present

## 2015-09-03 DIAGNOSIS — R600 Localized edema: Secondary | ICD-10-CM | POA: Diagnosis not present

## 2015-09-03 DIAGNOSIS — Z4800 Encounter for change or removal of nonsurgical wound dressing: Secondary | ICD-10-CM | POA: Diagnosis not present

## 2015-09-03 DIAGNOSIS — B372 Candidiasis of skin and nail: Secondary | ICD-10-CM | POA: Diagnosis not present

## 2015-09-03 DIAGNOSIS — I10 Essential (primary) hypertension: Secondary | ICD-10-CM | POA: Diagnosis not present

## 2015-09-03 DIAGNOSIS — K760 Fatty (change of) liver, not elsewhere classified: Secondary | ICD-10-CM | POA: Diagnosis not present

## 2015-09-07 DIAGNOSIS — Z7982 Long term (current) use of aspirin: Secondary | ICD-10-CM | POA: Diagnosis not present

## 2015-09-07 DIAGNOSIS — Z4800 Encounter for change or removal of nonsurgical wound dressing: Secondary | ICD-10-CM | POA: Diagnosis not present

## 2015-09-07 DIAGNOSIS — I4891 Unspecified atrial fibrillation: Secondary | ICD-10-CM | POA: Diagnosis not present

## 2015-09-07 DIAGNOSIS — R238 Other skin changes: Secondary | ICD-10-CM | POA: Diagnosis not present

## 2015-09-07 DIAGNOSIS — Z7951 Long term (current) use of inhaled steroids: Secondary | ICD-10-CM | POA: Diagnosis not present

## 2015-09-07 DIAGNOSIS — R600 Localized edema: Secondary | ICD-10-CM | POA: Diagnosis not present

## 2015-09-07 DIAGNOSIS — K760 Fatty (change of) liver, not elsewhere classified: Secondary | ICD-10-CM | POA: Diagnosis not present

## 2015-09-07 DIAGNOSIS — B372 Candidiasis of skin and nail: Secondary | ICD-10-CM | POA: Diagnosis not present

## 2015-09-07 DIAGNOSIS — I10 Essential (primary) hypertension: Secondary | ICD-10-CM | POA: Diagnosis not present

## 2015-09-08 DIAGNOSIS — D696 Thrombocytopenia, unspecified: Secondary | ICD-10-CM | POA: Diagnosis not present

## 2015-09-08 DIAGNOSIS — I5032 Chronic diastolic (congestive) heart failure: Secondary | ICD-10-CM | POA: Diagnosis not present

## 2015-09-09 DIAGNOSIS — I4891 Unspecified atrial fibrillation: Secondary | ICD-10-CM | POA: Diagnosis not present

## 2015-09-09 DIAGNOSIS — S31109A Unspecified open wound of abdominal wall, unspecified quadrant without penetration into peritoneal cavity, initial encounter: Secondary | ICD-10-CM | POA: Diagnosis not present

## 2015-09-09 DIAGNOSIS — Z4800 Encounter for change or removal of nonsurgical wound dressing: Secondary | ICD-10-CM | POA: Diagnosis not present

## 2015-09-09 DIAGNOSIS — B372 Candidiasis of skin and nail: Secondary | ICD-10-CM | POA: Diagnosis not present

## 2015-09-09 DIAGNOSIS — Z7982 Long term (current) use of aspirin: Secondary | ICD-10-CM | POA: Diagnosis not present

## 2015-09-09 DIAGNOSIS — Z7951 Long term (current) use of inhaled steroids: Secondary | ICD-10-CM | POA: Diagnosis not present

## 2015-09-09 DIAGNOSIS — K760 Fatty (change of) liver, not elsewhere classified: Secondary | ICD-10-CM | POA: Diagnosis not present

## 2015-09-09 DIAGNOSIS — I10 Essential (primary) hypertension: Secondary | ICD-10-CM | POA: Diagnosis not present

## 2015-09-09 DIAGNOSIS — R600 Localized edema: Secondary | ICD-10-CM | POA: Diagnosis not present

## 2015-09-09 DIAGNOSIS — R238 Other skin changes: Secondary | ICD-10-CM | POA: Diagnosis not present

## 2015-09-10 DIAGNOSIS — Z79899 Other long term (current) drug therapy: Secondary | ICD-10-CM | POA: Diagnosis not present

## 2015-09-11 DIAGNOSIS — R600 Localized edema: Secondary | ICD-10-CM | POA: Diagnosis not present

## 2015-09-11 DIAGNOSIS — R238 Other skin changes: Secondary | ICD-10-CM | POA: Diagnosis not present

## 2015-09-11 DIAGNOSIS — Z7982 Long term (current) use of aspirin: Secondary | ICD-10-CM | POA: Diagnosis not present

## 2015-09-11 DIAGNOSIS — I4891 Unspecified atrial fibrillation: Secondary | ICD-10-CM | POA: Diagnosis not present

## 2015-09-11 DIAGNOSIS — I10 Essential (primary) hypertension: Secondary | ICD-10-CM | POA: Diagnosis not present

## 2015-09-11 DIAGNOSIS — Z7951 Long term (current) use of inhaled steroids: Secondary | ICD-10-CM | POA: Diagnosis not present

## 2015-09-11 DIAGNOSIS — Z4800 Encounter for change or removal of nonsurgical wound dressing: Secondary | ICD-10-CM | POA: Diagnosis not present

## 2015-09-11 DIAGNOSIS — K760 Fatty (change of) liver, not elsewhere classified: Secondary | ICD-10-CM | POA: Diagnosis not present

## 2015-09-11 DIAGNOSIS — B372 Candidiasis of skin and nail: Secondary | ICD-10-CM | POA: Diagnosis not present

## 2015-09-14 DIAGNOSIS — B372 Candidiasis of skin and nail: Secondary | ICD-10-CM | POA: Diagnosis not present

## 2015-09-14 DIAGNOSIS — R600 Localized edema: Secondary | ICD-10-CM | POA: Diagnosis not present

## 2015-09-14 DIAGNOSIS — I10 Essential (primary) hypertension: Secondary | ICD-10-CM | POA: Diagnosis not present

## 2015-09-14 DIAGNOSIS — I4891 Unspecified atrial fibrillation: Secondary | ICD-10-CM | POA: Diagnosis not present

## 2015-09-14 DIAGNOSIS — Z7982 Long term (current) use of aspirin: Secondary | ICD-10-CM | POA: Diagnosis not present

## 2015-09-14 DIAGNOSIS — S31109A Unspecified open wound of abdominal wall, unspecified quadrant without penetration into peritoneal cavity, initial encounter: Secondary | ICD-10-CM | POA: Diagnosis not present

## 2015-09-14 DIAGNOSIS — K760 Fatty (change of) liver, not elsewhere classified: Secondary | ICD-10-CM | POA: Diagnosis not present

## 2015-09-14 DIAGNOSIS — Z7951 Long term (current) use of inhaled steroids: Secondary | ICD-10-CM | POA: Diagnosis not present

## 2015-09-14 DIAGNOSIS — Z4800 Encounter for change or removal of nonsurgical wound dressing: Secondary | ICD-10-CM | POA: Diagnosis not present

## 2015-09-14 DIAGNOSIS — R238 Other skin changes: Secondary | ICD-10-CM | POA: Diagnosis not present

## 2015-09-15 DIAGNOSIS — R600 Localized edema: Secondary | ICD-10-CM | POA: Diagnosis not present

## 2015-09-15 DIAGNOSIS — R238 Other skin changes: Secondary | ICD-10-CM | POA: Diagnosis not present

## 2015-09-15 DIAGNOSIS — I10 Essential (primary) hypertension: Secondary | ICD-10-CM | POA: Diagnosis not present

## 2015-09-15 DIAGNOSIS — K760 Fatty (change of) liver, not elsewhere classified: Secondary | ICD-10-CM | POA: Diagnosis not present

## 2015-09-15 DIAGNOSIS — I4891 Unspecified atrial fibrillation: Secondary | ICD-10-CM | POA: Diagnosis not present

## 2015-09-15 DIAGNOSIS — Z4800 Encounter for change or removal of nonsurgical wound dressing: Secondary | ICD-10-CM | POA: Diagnosis not present

## 2015-09-15 DIAGNOSIS — B372 Candidiasis of skin and nail: Secondary | ICD-10-CM | POA: Diagnosis not present

## 2015-09-15 DIAGNOSIS — Z7982 Long term (current) use of aspirin: Secondary | ICD-10-CM | POA: Diagnosis not present

## 2015-09-15 DIAGNOSIS — Z7951 Long term (current) use of inhaled steroids: Secondary | ICD-10-CM | POA: Diagnosis not present

## 2015-09-16 DIAGNOSIS — Z7982 Long term (current) use of aspirin: Secondary | ICD-10-CM | POA: Diagnosis not present

## 2015-09-16 DIAGNOSIS — I4891 Unspecified atrial fibrillation: Secondary | ICD-10-CM | POA: Diagnosis not present

## 2015-09-16 DIAGNOSIS — R238 Other skin changes: Secondary | ICD-10-CM | POA: Diagnosis not present

## 2015-09-16 DIAGNOSIS — K760 Fatty (change of) liver, not elsewhere classified: Secondary | ICD-10-CM | POA: Diagnosis not present

## 2015-09-16 DIAGNOSIS — Z4800 Encounter for change or removal of nonsurgical wound dressing: Secondary | ICD-10-CM | POA: Diagnosis not present

## 2015-09-16 DIAGNOSIS — I10 Essential (primary) hypertension: Secondary | ICD-10-CM | POA: Diagnosis not present

## 2015-09-16 DIAGNOSIS — R600 Localized edema: Secondary | ICD-10-CM | POA: Diagnosis not present

## 2015-09-16 DIAGNOSIS — B372 Candidiasis of skin and nail: Secondary | ICD-10-CM | POA: Diagnosis not present

## 2015-09-16 DIAGNOSIS — Z7951 Long term (current) use of inhaled steroids: Secondary | ICD-10-CM | POA: Diagnosis not present

## 2015-09-18 DIAGNOSIS — Z7982 Long term (current) use of aspirin: Secondary | ICD-10-CM | POA: Diagnosis not present

## 2015-09-18 DIAGNOSIS — Z4800 Encounter for change or removal of nonsurgical wound dressing: Secondary | ICD-10-CM | POA: Diagnosis not present

## 2015-09-18 DIAGNOSIS — R600 Localized edema: Secondary | ICD-10-CM | POA: Diagnosis not present

## 2015-09-18 DIAGNOSIS — B372 Candidiasis of skin and nail: Secondary | ICD-10-CM | POA: Diagnosis not present

## 2015-09-18 DIAGNOSIS — K760 Fatty (change of) liver, not elsewhere classified: Secondary | ICD-10-CM | POA: Diagnosis not present

## 2015-09-18 DIAGNOSIS — R238 Other skin changes: Secondary | ICD-10-CM | POA: Diagnosis not present

## 2015-09-18 DIAGNOSIS — I4891 Unspecified atrial fibrillation: Secondary | ICD-10-CM | POA: Diagnosis not present

## 2015-09-18 DIAGNOSIS — Z7951 Long term (current) use of inhaled steroids: Secondary | ICD-10-CM | POA: Diagnosis not present

## 2015-09-18 DIAGNOSIS — I10 Essential (primary) hypertension: Secondary | ICD-10-CM | POA: Diagnosis not present

## 2015-09-21 DIAGNOSIS — Z7951 Long term (current) use of inhaled steroids: Secondary | ICD-10-CM | POA: Diagnosis not present

## 2015-09-21 DIAGNOSIS — K760 Fatty (change of) liver, not elsewhere classified: Secondary | ICD-10-CM | POA: Diagnosis not present

## 2015-09-21 DIAGNOSIS — R238 Other skin changes: Secondary | ICD-10-CM | POA: Diagnosis not present

## 2015-09-21 DIAGNOSIS — B372 Candidiasis of skin and nail: Secondary | ICD-10-CM | POA: Diagnosis not present

## 2015-09-21 DIAGNOSIS — R600 Localized edema: Secondary | ICD-10-CM | POA: Diagnosis not present

## 2015-09-21 DIAGNOSIS — I4891 Unspecified atrial fibrillation: Secondary | ICD-10-CM | POA: Diagnosis not present

## 2015-09-21 DIAGNOSIS — I10 Essential (primary) hypertension: Secondary | ICD-10-CM | POA: Diagnosis not present

## 2015-09-21 DIAGNOSIS — Z7982 Long term (current) use of aspirin: Secondary | ICD-10-CM | POA: Diagnosis not present

## 2015-09-21 DIAGNOSIS — Z4800 Encounter for change or removal of nonsurgical wound dressing: Secondary | ICD-10-CM | POA: Diagnosis not present

## 2015-09-22 DIAGNOSIS — K760 Fatty (change of) liver, not elsewhere classified: Secondary | ICD-10-CM | POA: Diagnosis not present

## 2015-09-22 DIAGNOSIS — B372 Candidiasis of skin and nail: Secondary | ICD-10-CM | POA: Diagnosis not present

## 2015-09-22 DIAGNOSIS — Z4800 Encounter for change or removal of nonsurgical wound dressing: Secondary | ICD-10-CM | POA: Diagnosis not present

## 2015-09-22 DIAGNOSIS — I4891 Unspecified atrial fibrillation: Secondary | ICD-10-CM | POA: Diagnosis not present

## 2015-09-22 DIAGNOSIS — R600 Localized edema: Secondary | ICD-10-CM | POA: Diagnosis not present

## 2015-09-22 DIAGNOSIS — I10 Essential (primary) hypertension: Secondary | ICD-10-CM | POA: Diagnosis not present

## 2015-09-22 DIAGNOSIS — R238 Other skin changes: Secondary | ICD-10-CM | POA: Diagnosis not present

## 2015-09-22 DIAGNOSIS — Z7982 Long term (current) use of aspirin: Secondary | ICD-10-CM | POA: Diagnosis not present

## 2015-09-22 DIAGNOSIS — Z7951 Long term (current) use of inhaled steroids: Secondary | ICD-10-CM | POA: Diagnosis not present

## 2015-09-23 DIAGNOSIS — B372 Candidiasis of skin and nail: Secondary | ICD-10-CM | POA: Diagnosis not present

## 2015-09-23 DIAGNOSIS — R238 Other skin changes: Secondary | ICD-10-CM | POA: Diagnosis not present

## 2015-09-23 DIAGNOSIS — Z7982 Long term (current) use of aspirin: Secondary | ICD-10-CM | POA: Diagnosis not present

## 2015-09-23 DIAGNOSIS — I10 Essential (primary) hypertension: Secondary | ICD-10-CM | POA: Diagnosis not present

## 2015-09-23 DIAGNOSIS — K760 Fatty (change of) liver, not elsewhere classified: Secondary | ICD-10-CM | POA: Diagnosis not present

## 2015-09-23 DIAGNOSIS — Z4800 Encounter for change or removal of nonsurgical wound dressing: Secondary | ICD-10-CM | POA: Diagnosis not present

## 2015-09-23 DIAGNOSIS — Z7951 Long term (current) use of inhaled steroids: Secondary | ICD-10-CM | POA: Diagnosis not present

## 2015-09-23 DIAGNOSIS — R600 Localized edema: Secondary | ICD-10-CM | POA: Diagnosis not present

## 2015-09-23 DIAGNOSIS — I4891 Unspecified atrial fibrillation: Secondary | ICD-10-CM | POA: Diagnosis not present

## 2015-09-24 DIAGNOSIS — Z1231 Encounter for screening mammogram for malignant neoplasm of breast: Secondary | ICD-10-CM | POA: Diagnosis not present

## 2015-09-25 DIAGNOSIS — Z7951 Long term (current) use of inhaled steroids: Secondary | ICD-10-CM | POA: Diagnosis not present

## 2015-09-25 DIAGNOSIS — I480 Paroxysmal atrial fibrillation: Secondary | ICD-10-CM | POA: Diagnosis not present

## 2015-09-25 DIAGNOSIS — Z7982 Long term (current) use of aspirin: Secondary | ICD-10-CM | POA: Diagnosis not present

## 2015-09-25 DIAGNOSIS — R600 Localized edema: Secondary | ICD-10-CM | POA: Diagnosis not present

## 2015-09-25 DIAGNOSIS — K760 Fatty (change of) liver, not elsewhere classified: Secondary | ICD-10-CM | POA: Diagnosis not present

## 2015-09-25 DIAGNOSIS — I5043 Acute on chronic combined systolic (congestive) and diastolic (congestive) heart failure: Secondary | ICD-10-CM | POA: Diagnosis not present

## 2015-09-25 DIAGNOSIS — I4891 Unspecified atrial fibrillation: Secondary | ICD-10-CM | POA: Diagnosis not present

## 2015-09-25 DIAGNOSIS — I110 Hypertensive heart disease with heart failure: Secondary | ICD-10-CM | POA: Diagnosis not present

## 2015-09-25 DIAGNOSIS — Z4800 Encounter for change or removal of nonsurgical wound dressing: Secondary | ICD-10-CM | POA: Diagnosis not present

## 2015-09-25 DIAGNOSIS — R238 Other skin changes: Secondary | ICD-10-CM | POA: Diagnosis not present

## 2015-09-25 DIAGNOSIS — B372 Candidiasis of skin and nail: Secondary | ICD-10-CM | POA: Diagnosis not present

## 2015-09-25 DIAGNOSIS — I10 Essential (primary) hypertension: Secondary | ICD-10-CM | POA: Diagnosis not present

## 2015-09-25 DIAGNOSIS — I2510 Atherosclerotic heart disease of native coronary artery without angina pectoris: Secondary | ICD-10-CM | POA: Diagnosis not present

## 2015-09-29 DIAGNOSIS — Z7952 Long term (current) use of systemic steroids: Secondary | ICD-10-CM | POA: Diagnosis not present

## 2015-09-29 DIAGNOSIS — I110 Hypertensive heart disease with heart failure: Secondary | ICD-10-CM | POA: Diagnosis not present

## 2015-09-29 DIAGNOSIS — B372 Candidiasis of skin and nail: Secondary | ICD-10-CM | POA: Diagnosis not present

## 2015-09-29 DIAGNOSIS — R238 Other skin changes: Secondary | ICD-10-CM | POA: Diagnosis not present

## 2015-09-29 DIAGNOSIS — Z7982 Long term (current) use of aspirin: Secondary | ICD-10-CM | POA: Diagnosis not present

## 2015-09-29 DIAGNOSIS — Z4800 Encounter for change or removal of nonsurgical wound dressing: Secondary | ICD-10-CM | POA: Diagnosis not present

## 2015-09-29 DIAGNOSIS — Z79899 Other long term (current) drug therapy: Secondary | ICD-10-CM | POA: Diagnosis not present

## 2015-09-29 DIAGNOSIS — K760 Fatty (change of) liver, not elsewhere classified: Secondary | ICD-10-CM | POA: Diagnosis not present

## 2015-09-29 DIAGNOSIS — I2510 Atherosclerotic heart disease of native coronary artery without angina pectoris: Secondary | ICD-10-CM | POA: Diagnosis not present

## 2015-09-29 DIAGNOSIS — I5043 Acute on chronic combined systolic (congestive) and diastolic (congestive) heart failure: Secondary | ICD-10-CM | POA: Diagnosis not present

## 2015-09-29 DIAGNOSIS — I480 Paroxysmal atrial fibrillation: Secondary | ICD-10-CM | POA: Diagnosis not present

## 2015-09-30 DIAGNOSIS — R238 Other skin changes: Secondary | ICD-10-CM | POA: Diagnosis not present

## 2015-09-30 DIAGNOSIS — I2510 Atherosclerotic heart disease of native coronary artery without angina pectoris: Secondary | ICD-10-CM | POA: Diagnosis not present

## 2015-09-30 DIAGNOSIS — Z79899 Other long term (current) drug therapy: Secondary | ICD-10-CM | POA: Diagnosis not present

## 2015-09-30 DIAGNOSIS — Z4800 Encounter for change or removal of nonsurgical wound dressing: Secondary | ICD-10-CM | POA: Diagnosis not present

## 2015-09-30 DIAGNOSIS — I110 Hypertensive heart disease with heart failure: Secondary | ICD-10-CM | POA: Diagnosis not present

## 2015-09-30 DIAGNOSIS — Z7952 Long term (current) use of systemic steroids: Secondary | ICD-10-CM | POA: Diagnosis not present

## 2015-09-30 DIAGNOSIS — B372 Candidiasis of skin and nail: Secondary | ICD-10-CM | POA: Diagnosis not present

## 2015-09-30 DIAGNOSIS — I5043 Acute on chronic combined systolic (congestive) and diastolic (congestive) heart failure: Secondary | ICD-10-CM | POA: Diagnosis not present

## 2015-09-30 DIAGNOSIS — Z7982 Long term (current) use of aspirin: Secondary | ICD-10-CM | POA: Diagnosis not present

## 2015-09-30 DIAGNOSIS — K760 Fatty (change of) liver, not elsewhere classified: Secondary | ICD-10-CM | POA: Diagnosis not present

## 2015-09-30 DIAGNOSIS — I480 Paroxysmal atrial fibrillation: Secondary | ICD-10-CM | POA: Diagnosis not present

## 2015-10-01 DIAGNOSIS — M150 Primary generalized (osteo)arthritis: Secondary | ICD-10-CM | POA: Diagnosis not present

## 2015-10-01 DIAGNOSIS — Z79899 Other long term (current) drug therapy: Secondary | ICD-10-CM | POA: Diagnosis not present

## 2015-10-01 DIAGNOSIS — M3312 Other dermatopolymyositis with myopathy: Secondary | ICD-10-CM | POA: Diagnosis not present

## 2015-10-01 DIAGNOSIS — M0579 Rheumatoid arthritis with rheumatoid factor of multiple sites without organ or systems involvement: Secondary | ICD-10-CM | POA: Diagnosis not present

## 2015-10-02 DIAGNOSIS — Z7952 Long term (current) use of systemic steroids: Secondary | ICD-10-CM | POA: Diagnosis not present

## 2015-10-02 DIAGNOSIS — Z7982 Long term (current) use of aspirin: Secondary | ICD-10-CM | POA: Diagnosis not present

## 2015-10-02 DIAGNOSIS — R238 Other skin changes: Secondary | ICD-10-CM | POA: Diagnosis not present

## 2015-10-02 DIAGNOSIS — B372 Candidiasis of skin and nail: Secondary | ICD-10-CM | POA: Diagnosis not present

## 2015-10-02 DIAGNOSIS — Z79899 Other long term (current) drug therapy: Secondary | ICD-10-CM | POA: Diagnosis not present

## 2015-10-02 DIAGNOSIS — Z4800 Encounter for change or removal of nonsurgical wound dressing: Secondary | ICD-10-CM | POA: Diagnosis not present

## 2015-10-02 DIAGNOSIS — I2510 Atherosclerotic heart disease of native coronary artery without angina pectoris: Secondary | ICD-10-CM | POA: Diagnosis not present

## 2015-10-02 DIAGNOSIS — I480 Paroxysmal atrial fibrillation: Secondary | ICD-10-CM | POA: Diagnosis not present

## 2015-10-02 DIAGNOSIS — I5043 Acute on chronic combined systolic (congestive) and diastolic (congestive) heart failure: Secondary | ICD-10-CM | POA: Diagnosis not present

## 2015-10-02 DIAGNOSIS — I110 Hypertensive heart disease with heart failure: Secondary | ICD-10-CM | POA: Diagnosis not present

## 2015-10-02 DIAGNOSIS — K760 Fatty (change of) liver, not elsewhere classified: Secondary | ICD-10-CM | POA: Diagnosis not present

## 2015-10-05 DIAGNOSIS — I5043 Acute on chronic combined systolic (congestive) and diastolic (congestive) heart failure: Secondary | ICD-10-CM | POA: Diagnosis not present

## 2015-10-05 DIAGNOSIS — B372 Candidiasis of skin and nail: Secondary | ICD-10-CM | POA: Diagnosis not present

## 2015-10-05 DIAGNOSIS — I2510 Atherosclerotic heart disease of native coronary artery without angina pectoris: Secondary | ICD-10-CM | POA: Diagnosis not present

## 2015-10-05 DIAGNOSIS — Z4800 Encounter for change or removal of nonsurgical wound dressing: Secondary | ICD-10-CM | POA: Diagnosis not present

## 2015-10-05 DIAGNOSIS — I110 Hypertensive heart disease with heart failure: Secondary | ICD-10-CM | POA: Diagnosis not present

## 2015-10-05 DIAGNOSIS — K760 Fatty (change of) liver, not elsewhere classified: Secondary | ICD-10-CM | POA: Diagnosis not present

## 2015-10-05 DIAGNOSIS — Z7952 Long term (current) use of systemic steroids: Secondary | ICD-10-CM | POA: Diagnosis not present

## 2015-10-05 DIAGNOSIS — R238 Other skin changes: Secondary | ICD-10-CM | POA: Diagnosis not present

## 2015-10-05 DIAGNOSIS — S31109A Unspecified open wound of abdominal wall, unspecified quadrant without penetration into peritoneal cavity, initial encounter: Secondary | ICD-10-CM | POA: Diagnosis not present

## 2015-10-05 DIAGNOSIS — Z7982 Long term (current) use of aspirin: Secondary | ICD-10-CM | POA: Diagnosis not present

## 2015-10-05 DIAGNOSIS — Z79899 Other long term (current) drug therapy: Secondary | ICD-10-CM | POA: Diagnosis not present

## 2015-10-05 DIAGNOSIS — I480 Paroxysmal atrial fibrillation: Secondary | ICD-10-CM | POA: Diagnosis not present

## 2015-10-07 DIAGNOSIS — I110 Hypertensive heart disease with heart failure: Secondary | ICD-10-CM | POA: Diagnosis not present

## 2015-10-07 DIAGNOSIS — R238 Other skin changes: Secondary | ICD-10-CM | POA: Diagnosis not present

## 2015-10-07 DIAGNOSIS — Z79899 Other long term (current) drug therapy: Secondary | ICD-10-CM | POA: Diagnosis not present

## 2015-10-07 DIAGNOSIS — I2510 Atherosclerotic heart disease of native coronary artery without angina pectoris: Secondary | ICD-10-CM | POA: Diagnosis not present

## 2015-10-07 DIAGNOSIS — I5043 Acute on chronic combined systolic (congestive) and diastolic (congestive) heart failure: Secondary | ICD-10-CM | POA: Diagnosis not present

## 2015-10-07 DIAGNOSIS — I480 Paroxysmal atrial fibrillation: Secondary | ICD-10-CM | POA: Diagnosis not present

## 2015-10-07 DIAGNOSIS — K760 Fatty (change of) liver, not elsewhere classified: Secondary | ICD-10-CM | POA: Diagnosis not present

## 2015-10-07 DIAGNOSIS — B372 Candidiasis of skin and nail: Secondary | ICD-10-CM | POA: Diagnosis not present

## 2015-10-07 DIAGNOSIS — Z4800 Encounter for change or removal of nonsurgical wound dressing: Secondary | ICD-10-CM | POA: Diagnosis not present

## 2015-10-07 DIAGNOSIS — S31109A Unspecified open wound of abdominal wall, unspecified quadrant without penetration into peritoneal cavity, initial encounter: Secondary | ICD-10-CM | POA: Diagnosis not present

## 2015-10-07 DIAGNOSIS — Z7952 Long term (current) use of systemic steroids: Secondary | ICD-10-CM | POA: Diagnosis not present

## 2015-10-07 DIAGNOSIS — Z7982 Long term (current) use of aspirin: Secondary | ICD-10-CM | POA: Diagnosis not present

## 2015-10-09 DIAGNOSIS — I110 Hypertensive heart disease with heart failure: Secondary | ICD-10-CM | POA: Diagnosis not present

## 2015-10-09 DIAGNOSIS — I5043 Acute on chronic combined systolic (congestive) and diastolic (congestive) heart failure: Secondary | ICD-10-CM | POA: Diagnosis not present

## 2015-10-09 DIAGNOSIS — I2510 Atherosclerotic heart disease of native coronary artery without angina pectoris: Secondary | ICD-10-CM | POA: Diagnosis not present

## 2015-10-09 DIAGNOSIS — Z7952 Long term (current) use of systemic steroids: Secondary | ICD-10-CM | POA: Diagnosis not present

## 2015-10-09 DIAGNOSIS — K760 Fatty (change of) liver, not elsewhere classified: Secondary | ICD-10-CM | POA: Diagnosis not present

## 2015-10-09 DIAGNOSIS — Z4800 Encounter for change or removal of nonsurgical wound dressing: Secondary | ICD-10-CM | POA: Diagnosis not present

## 2015-10-09 DIAGNOSIS — R238 Other skin changes: Secondary | ICD-10-CM | POA: Diagnosis not present

## 2015-10-09 DIAGNOSIS — I480 Paroxysmal atrial fibrillation: Secondary | ICD-10-CM | POA: Diagnosis not present

## 2015-10-09 DIAGNOSIS — Z7982 Long term (current) use of aspirin: Secondary | ICD-10-CM | POA: Diagnosis not present

## 2015-10-09 DIAGNOSIS — B372 Candidiasis of skin and nail: Secondary | ICD-10-CM | POA: Diagnosis not present

## 2015-10-09 DIAGNOSIS — Z79899 Other long term (current) drug therapy: Secondary | ICD-10-CM | POA: Diagnosis not present

## 2015-10-12 DIAGNOSIS — B372 Candidiasis of skin and nail: Secondary | ICD-10-CM | POA: Diagnosis not present

## 2015-10-12 DIAGNOSIS — K760 Fatty (change of) liver, not elsewhere classified: Secondary | ICD-10-CM | POA: Diagnosis not present

## 2015-10-12 DIAGNOSIS — I5043 Acute on chronic combined systolic (congestive) and diastolic (congestive) heart failure: Secondary | ICD-10-CM | POA: Diagnosis not present

## 2015-10-12 DIAGNOSIS — I2510 Atherosclerotic heart disease of native coronary artery without angina pectoris: Secondary | ICD-10-CM | POA: Diagnosis not present

## 2015-10-12 DIAGNOSIS — Z4800 Encounter for change or removal of nonsurgical wound dressing: Secondary | ICD-10-CM | POA: Diagnosis not present

## 2015-10-12 DIAGNOSIS — I480 Paroxysmal atrial fibrillation: Secondary | ICD-10-CM | POA: Diagnosis not present

## 2015-10-12 DIAGNOSIS — Z79899 Other long term (current) drug therapy: Secondary | ICD-10-CM | POA: Diagnosis not present

## 2015-10-12 DIAGNOSIS — Z7952 Long term (current) use of systemic steroids: Secondary | ICD-10-CM | POA: Diagnosis not present

## 2015-10-12 DIAGNOSIS — Z7982 Long term (current) use of aspirin: Secondary | ICD-10-CM | POA: Diagnosis not present

## 2015-10-12 DIAGNOSIS — R238 Other skin changes: Secondary | ICD-10-CM | POA: Diagnosis not present

## 2015-10-12 DIAGNOSIS — I110 Hypertensive heart disease with heart failure: Secondary | ICD-10-CM | POA: Diagnosis not present

## 2015-10-16 DIAGNOSIS — Z7982 Long term (current) use of aspirin: Secondary | ICD-10-CM | POA: Diagnosis not present

## 2015-10-16 DIAGNOSIS — I5043 Acute on chronic combined systolic (congestive) and diastolic (congestive) heart failure: Secondary | ICD-10-CM | POA: Diagnosis not present

## 2015-10-16 DIAGNOSIS — Z79899 Other long term (current) drug therapy: Secondary | ICD-10-CM | POA: Diagnosis not present

## 2015-10-16 DIAGNOSIS — K760 Fatty (change of) liver, not elsewhere classified: Secondary | ICD-10-CM | POA: Diagnosis not present

## 2015-10-16 DIAGNOSIS — R238 Other skin changes: Secondary | ICD-10-CM | POA: Diagnosis not present

## 2015-10-16 DIAGNOSIS — I480 Paroxysmal atrial fibrillation: Secondary | ICD-10-CM | POA: Diagnosis not present

## 2015-10-16 DIAGNOSIS — Z4800 Encounter for change or removal of nonsurgical wound dressing: Secondary | ICD-10-CM | POA: Diagnosis not present

## 2015-10-16 DIAGNOSIS — Z7952 Long term (current) use of systemic steroids: Secondary | ICD-10-CM | POA: Diagnosis not present

## 2015-10-16 DIAGNOSIS — I110 Hypertensive heart disease with heart failure: Secondary | ICD-10-CM | POA: Diagnosis not present

## 2015-10-16 DIAGNOSIS — I2510 Atherosclerotic heart disease of native coronary artery without angina pectoris: Secondary | ICD-10-CM | POA: Diagnosis not present

## 2015-10-16 DIAGNOSIS — B372 Candidiasis of skin and nail: Secondary | ICD-10-CM | POA: Diagnosis not present

## 2015-10-20 DIAGNOSIS — I480 Paroxysmal atrial fibrillation: Secondary | ICD-10-CM | POA: Diagnosis not present

## 2015-10-20 DIAGNOSIS — K760 Fatty (change of) liver, not elsewhere classified: Secondary | ICD-10-CM | POA: Diagnosis not present

## 2015-10-20 DIAGNOSIS — Z7982 Long term (current) use of aspirin: Secondary | ICD-10-CM | POA: Diagnosis not present

## 2015-10-20 DIAGNOSIS — R238 Other skin changes: Secondary | ICD-10-CM | POA: Diagnosis not present

## 2015-10-20 DIAGNOSIS — B372 Candidiasis of skin and nail: Secondary | ICD-10-CM | POA: Diagnosis not present

## 2015-10-20 DIAGNOSIS — Z4800 Encounter for change or removal of nonsurgical wound dressing: Secondary | ICD-10-CM | POA: Diagnosis not present

## 2015-10-20 DIAGNOSIS — I2510 Atherosclerotic heart disease of native coronary artery without angina pectoris: Secondary | ICD-10-CM | POA: Diagnosis not present

## 2015-10-20 DIAGNOSIS — Z7952 Long term (current) use of systemic steroids: Secondary | ICD-10-CM | POA: Diagnosis not present

## 2015-10-20 DIAGNOSIS — I110 Hypertensive heart disease with heart failure: Secondary | ICD-10-CM | POA: Diagnosis not present

## 2015-10-20 DIAGNOSIS — I5043 Acute on chronic combined systolic (congestive) and diastolic (congestive) heart failure: Secondary | ICD-10-CM | POA: Diagnosis not present

## 2015-10-20 DIAGNOSIS — Z79899 Other long term (current) drug therapy: Secondary | ICD-10-CM | POA: Diagnosis not present

## 2015-10-22 DIAGNOSIS — I5043 Acute on chronic combined systolic (congestive) and diastolic (congestive) heart failure: Secondary | ICD-10-CM | POA: Diagnosis not present

## 2015-10-22 DIAGNOSIS — B372 Candidiasis of skin and nail: Secondary | ICD-10-CM | POA: Diagnosis not present

## 2015-10-22 DIAGNOSIS — R238 Other skin changes: Secondary | ICD-10-CM | POA: Diagnosis not present

## 2015-10-22 DIAGNOSIS — I2510 Atherosclerotic heart disease of native coronary artery without angina pectoris: Secondary | ICD-10-CM | POA: Diagnosis not present

## 2015-10-22 DIAGNOSIS — Z7952 Long term (current) use of systemic steroids: Secondary | ICD-10-CM | POA: Diagnosis not present

## 2015-10-22 DIAGNOSIS — I110 Hypertensive heart disease with heart failure: Secondary | ICD-10-CM | POA: Diagnosis not present

## 2015-10-22 DIAGNOSIS — K760 Fatty (change of) liver, not elsewhere classified: Secondary | ICD-10-CM | POA: Diagnosis not present

## 2015-10-22 DIAGNOSIS — I480 Paroxysmal atrial fibrillation: Secondary | ICD-10-CM | POA: Diagnosis not present

## 2015-10-22 DIAGNOSIS — Z79899 Other long term (current) drug therapy: Secondary | ICD-10-CM | POA: Diagnosis not present

## 2015-10-22 DIAGNOSIS — Z7982 Long term (current) use of aspirin: Secondary | ICD-10-CM | POA: Diagnosis not present

## 2015-10-22 DIAGNOSIS — Z4800 Encounter for change or removal of nonsurgical wound dressing: Secondary | ICD-10-CM | POA: Diagnosis not present

## 2015-10-23 DIAGNOSIS — S31109A Unspecified open wound of abdominal wall, unspecified quadrant without penetration into peritoneal cavity, initial encounter: Secondary | ICD-10-CM | POA: Diagnosis not present

## 2015-10-26 DIAGNOSIS — I2510 Atherosclerotic heart disease of native coronary artery without angina pectoris: Secondary | ICD-10-CM | POA: Diagnosis not present

## 2015-10-26 DIAGNOSIS — Z7952 Long term (current) use of systemic steroids: Secondary | ICD-10-CM | POA: Diagnosis not present

## 2015-10-26 DIAGNOSIS — Z79899 Other long term (current) drug therapy: Secondary | ICD-10-CM | POA: Diagnosis not present

## 2015-10-26 DIAGNOSIS — Z4800 Encounter for change or removal of nonsurgical wound dressing: Secondary | ICD-10-CM | POA: Diagnosis not present

## 2015-10-26 DIAGNOSIS — I5043 Acute on chronic combined systolic (congestive) and diastolic (congestive) heart failure: Secondary | ICD-10-CM | POA: Diagnosis not present

## 2015-10-26 DIAGNOSIS — K760 Fatty (change of) liver, not elsewhere classified: Secondary | ICD-10-CM | POA: Diagnosis not present

## 2015-10-26 DIAGNOSIS — I480 Paroxysmal atrial fibrillation: Secondary | ICD-10-CM | POA: Diagnosis not present

## 2015-10-26 DIAGNOSIS — B372 Candidiasis of skin and nail: Secondary | ICD-10-CM | POA: Diagnosis not present

## 2015-10-26 DIAGNOSIS — Z7982 Long term (current) use of aspirin: Secondary | ICD-10-CM | POA: Diagnosis not present

## 2015-10-26 DIAGNOSIS — I110 Hypertensive heart disease with heart failure: Secondary | ICD-10-CM | POA: Diagnosis not present

## 2015-10-26 DIAGNOSIS — R238 Other skin changes: Secondary | ICD-10-CM | POA: Diagnosis not present

## 2015-10-29 DIAGNOSIS — B372 Candidiasis of skin and nail: Secondary | ICD-10-CM | POA: Diagnosis not present

## 2015-10-29 DIAGNOSIS — Z7982 Long term (current) use of aspirin: Secondary | ICD-10-CM | POA: Diagnosis not present

## 2015-10-29 DIAGNOSIS — K760 Fatty (change of) liver, not elsewhere classified: Secondary | ICD-10-CM | POA: Diagnosis not present

## 2015-10-29 DIAGNOSIS — I480 Paroxysmal atrial fibrillation: Secondary | ICD-10-CM | POA: Diagnosis not present

## 2015-10-29 DIAGNOSIS — Z79899 Other long term (current) drug therapy: Secondary | ICD-10-CM | POA: Diagnosis not present

## 2015-10-29 DIAGNOSIS — R238 Other skin changes: Secondary | ICD-10-CM | POA: Diagnosis not present

## 2015-10-29 DIAGNOSIS — Z7952 Long term (current) use of systemic steroids: Secondary | ICD-10-CM | POA: Diagnosis not present

## 2015-10-29 DIAGNOSIS — I5043 Acute on chronic combined systolic (congestive) and diastolic (congestive) heart failure: Secondary | ICD-10-CM | POA: Diagnosis not present

## 2015-10-29 DIAGNOSIS — Z4800 Encounter for change or removal of nonsurgical wound dressing: Secondary | ICD-10-CM | POA: Diagnosis not present

## 2015-10-29 DIAGNOSIS — I110 Hypertensive heart disease with heart failure: Secondary | ICD-10-CM | POA: Diagnosis not present

## 2015-10-29 DIAGNOSIS — I2510 Atherosclerotic heart disease of native coronary artery without angina pectoris: Secondary | ICD-10-CM | POA: Diagnosis not present

## 2015-10-30 DIAGNOSIS — B372 Candidiasis of skin and nail: Secondary | ICD-10-CM | POA: Diagnosis not present

## 2015-10-30 DIAGNOSIS — I110 Hypertensive heart disease with heart failure: Secondary | ICD-10-CM | POA: Diagnosis not present

## 2015-10-30 DIAGNOSIS — I5043 Acute on chronic combined systolic (congestive) and diastolic (congestive) heart failure: Secondary | ICD-10-CM | POA: Diagnosis not present

## 2015-10-30 DIAGNOSIS — R238 Other skin changes: Secondary | ICD-10-CM | POA: Diagnosis not present

## 2015-11-03 DIAGNOSIS — Z7952 Long term (current) use of systemic steroids: Secondary | ICD-10-CM | POA: Diagnosis not present

## 2015-11-03 DIAGNOSIS — B372 Candidiasis of skin and nail: Secondary | ICD-10-CM | POA: Diagnosis not present

## 2015-11-03 DIAGNOSIS — K760 Fatty (change of) liver, not elsewhere classified: Secondary | ICD-10-CM | POA: Diagnosis not present

## 2015-11-03 DIAGNOSIS — Z7982 Long term (current) use of aspirin: Secondary | ICD-10-CM | POA: Diagnosis not present

## 2015-11-03 DIAGNOSIS — I5043 Acute on chronic combined systolic (congestive) and diastolic (congestive) heart failure: Secondary | ICD-10-CM | POA: Diagnosis not present

## 2015-11-03 DIAGNOSIS — I2510 Atherosclerotic heart disease of native coronary artery without angina pectoris: Secondary | ICD-10-CM | POA: Diagnosis not present

## 2015-11-03 DIAGNOSIS — Z79899 Other long term (current) drug therapy: Secondary | ICD-10-CM | POA: Diagnosis not present

## 2015-11-03 DIAGNOSIS — I110 Hypertensive heart disease with heart failure: Secondary | ICD-10-CM | POA: Diagnosis not present

## 2015-11-03 DIAGNOSIS — R238 Other skin changes: Secondary | ICD-10-CM | POA: Diagnosis not present

## 2015-11-03 DIAGNOSIS — Z4800 Encounter for change or removal of nonsurgical wound dressing: Secondary | ICD-10-CM | POA: Diagnosis not present

## 2015-11-03 DIAGNOSIS — I480 Paroxysmal atrial fibrillation: Secondary | ICD-10-CM | POA: Diagnosis not present

## 2015-11-05 DIAGNOSIS — Z7952 Long term (current) use of systemic steroids: Secondary | ICD-10-CM | POA: Diagnosis not present

## 2015-11-05 DIAGNOSIS — I480 Paroxysmal atrial fibrillation: Secondary | ICD-10-CM | POA: Diagnosis not present

## 2015-11-05 DIAGNOSIS — Z4800 Encounter for change or removal of nonsurgical wound dressing: Secondary | ICD-10-CM | POA: Diagnosis not present

## 2015-11-05 DIAGNOSIS — B372 Candidiasis of skin and nail: Secondary | ICD-10-CM | POA: Diagnosis not present

## 2015-11-05 DIAGNOSIS — Z7982 Long term (current) use of aspirin: Secondary | ICD-10-CM | POA: Diagnosis not present

## 2015-11-05 DIAGNOSIS — K760 Fatty (change of) liver, not elsewhere classified: Secondary | ICD-10-CM | POA: Diagnosis not present

## 2015-11-05 DIAGNOSIS — I2510 Atherosclerotic heart disease of native coronary artery without angina pectoris: Secondary | ICD-10-CM | POA: Diagnosis not present

## 2015-11-05 DIAGNOSIS — R238 Other skin changes: Secondary | ICD-10-CM | POA: Diagnosis not present

## 2015-11-05 DIAGNOSIS — I110 Hypertensive heart disease with heart failure: Secondary | ICD-10-CM | POA: Diagnosis not present

## 2015-11-05 DIAGNOSIS — Z79899 Other long term (current) drug therapy: Secondary | ICD-10-CM | POA: Diagnosis not present

## 2015-11-05 DIAGNOSIS — S31109A Unspecified open wound of abdominal wall, unspecified quadrant without penetration into peritoneal cavity, initial encounter: Secondary | ICD-10-CM | POA: Diagnosis not present

## 2015-11-05 DIAGNOSIS — I5043 Acute on chronic combined systolic (congestive) and diastolic (congestive) heart failure: Secondary | ICD-10-CM | POA: Diagnosis not present

## 2015-11-10 DIAGNOSIS — Z4800 Encounter for change or removal of nonsurgical wound dressing: Secondary | ICD-10-CM | POA: Diagnosis not present

## 2015-11-10 DIAGNOSIS — I5043 Acute on chronic combined systolic (congestive) and diastolic (congestive) heart failure: Secondary | ICD-10-CM | POA: Diagnosis not present

## 2015-11-10 DIAGNOSIS — B372 Candidiasis of skin and nail: Secondary | ICD-10-CM | POA: Diagnosis not present

## 2015-11-10 DIAGNOSIS — R238 Other skin changes: Secondary | ICD-10-CM | POA: Diagnosis not present

## 2015-11-10 DIAGNOSIS — Z79899 Other long term (current) drug therapy: Secondary | ICD-10-CM | POA: Diagnosis not present

## 2015-11-10 DIAGNOSIS — I110 Hypertensive heart disease with heart failure: Secondary | ICD-10-CM | POA: Diagnosis not present

## 2015-11-10 DIAGNOSIS — K760 Fatty (change of) liver, not elsewhere classified: Secondary | ICD-10-CM | POA: Diagnosis not present

## 2015-11-10 DIAGNOSIS — I480 Paroxysmal atrial fibrillation: Secondary | ICD-10-CM | POA: Diagnosis not present

## 2015-11-10 DIAGNOSIS — Z7952 Long term (current) use of systemic steroids: Secondary | ICD-10-CM | POA: Diagnosis not present

## 2015-11-10 DIAGNOSIS — Z7982 Long term (current) use of aspirin: Secondary | ICD-10-CM | POA: Diagnosis not present

## 2015-11-10 DIAGNOSIS — I2510 Atherosclerotic heart disease of native coronary artery without angina pectoris: Secondary | ICD-10-CM | POA: Diagnosis not present

## 2015-11-13 DIAGNOSIS — I2510 Atherosclerotic heart disease of native coronary artery without angina pectoris: Secondary | ICD-10-CM | POA: Diagnosis not present

## 2015-11-13 DIAGNOSIS — Z79899 Other long term (current) drug therapy: Secondary | ICD-10-CM | POA: Diagnosis not present

## 2015-11-13 DIAGNOSIS — I5043 Acute on chronic combined systolic (congestive) and diastolic (congestive) heart failure: Secondary | ICD-10-CM | POA: Diagnosis not present

## 2015-11-13 DIAGNOSIS — I110 Hypertensive heart disease with heart failure: Secondary | ICD-10-CM | POA: Diagnosis not present

## 2015-11-13 DIAGNOSIS — I480 Paroxysmal atrial fibrillation: Secondary | ICD-10-CM | POA: Diagnosis not present

## 2015-11-13 DIAGNOSIS — R238 Other skin changes: Secondary | ICD-10-CM | POA: Diagnosis not present

## 2015-11-13 DIAGNOSIS — K760 Fatty (change of) liver, not elsewhere classified: Secondary | ICD-10-CM | POA: Diagnosis not present

## 2015-11-13 DIAGNOSIS — B372 Candidiasis of skin and nail: Secondary | ICD-10-CM | POA: Diagnosis not present

## 2015-11-13 DIAGNOSIS — Z7982 Long term (current) use of aspirin: Secondary | ICD-10-CM | POA: Diagnosis not present

## 2015-11-13 DIAGNOSIS — Z4800 Encounter for change or removal of nonsurgical wound dressing: Secondary | ICD-10-CM | POA: Diagnosis not present

## 2015-11-13 DIAGNOSIS — Z7952 Long term (current) use of systemic steroids: Secondary | ICD-10-CM | POA: Diagnosis not present

## 2015-11-16 DIAGNOSIS — I5043 Acute on chronic combined systolic (congestive) and diastolic (congestive) heart failure: Secondary | ICD-10-CM | POA: Diagnosis not present

## 2015-11-16 DIAGNOSIS — Z4800 Encounter for change or removal of nonsurgical wound dressing: Secondary | ICD-10-CM | POA: Diagnosis not present

## 2015-11-16 DIAGNOSIS — I2510 Atherosclerotic heart disease of native coronary artery without angina pectoris: Secondary | ICD-10-CM | POA: Diagnosis not present

## 2015-11-16 DIAGNOSIS — I480 Paroxysmal atrial fibrillation: Secondary | ICD-10-CM | POA: Diagnosis not present

## 2015-11-16 DIAGNOSIS — K760 Fatty (change of) liver, not elsewhere classified: Secondary | ICD-10-CM | POA: Diagnosis not present

## 2015-11-16 DIAGNOSIS — R238 Other skin changes: Secondary | ICD-10-CM | POA: Diagnosis not present

## 2015-11-16 DIAGNOSIS — Z7982 Long term (current) use of aspirin: Secondary | ICD-10-CM | POA: Diagnosis not present

## 2015-11-16 DIAGNOSIS — Z7952 Long term (current) use of systemic steroids: Secondary | ICD-10-CM | POA: Diagnosis not present

## 2015-11-16 DIAGNOSIS — I110 Hypertensive heart disease with heart failure: Secondary | ICD-10-CM | POA: Diagnosis not present

## 2015-11-16 DIAGNOSIS — Z79899 Other long term (current) drug therapy: Secondary | ICD-10-CM | POA: Diagnosis not present

## 2015-11-16 DIAGNOSIS — B372 Candidiasis of skin and nail: Secondary | ICD-10-CM | POA: Diagnosis not present

## 2015-11-20 DIAGNOSIS — I480 Paroxysmal atrial fibrillation: Secondary | ICD-10-CM | POA: Diagnosis not present

## 2015-11-20 DIAGNOSIS — B372 Candidiasis of skin and nail: Secondary | ICD-10-CM | POA: Diagnosis not present

## 2015-11-20 DIAGNOSIS — R238 Other skin changes: Secondary | ICD-10-CM | POA: Diagnosis not present

## 2015-11-20 DIAGNOSIS — K760 Fatty (change of) liver, not elsewhere classified: Secondary | ICD-10-CM | POA: Diagnosis not present

## 2015-11-20 DIAGNOSIS — I2510 Atherosclerotic heart disease of native coronary artery without angina pectoris: Secondary | ICD-10-CM | POA: Diagnosis not present

## 2015-11-20 DIAGNOSIS — I110 Hypertensive heart disease with heart failure: Secondary | ICD-10-CM | POA: Diagnosis not present

## 2015-11-20 DIAGNOSIS — Z79899 Other long term (current) drug therapy: Secondary | ICD-10-CM | POA: Diagnosis not present

## 2015-11-20 DIAGNOSIS — I5043 Acute on chronic combined systolic (congestive) and diastolic (congestive) heart failure: Secondary | ICD-10-CM | POA: Diagnosis not present

## 2015-11-20 DIAGNOSIS — Z4800 Encounter for change or removal of nonsurgical wound dressing: Secondary | ICD-10-CM | POA: Diagnosis not present

## 2015-11-20 DIAGNOSIS — Z7952 Long term (current) use of systemic steroids: Secondary | ICD-10-CM | POA: Diagnosis not present

## 2015-11-20 DIAGNOSIS — Z7982 Long term (current) use of aspirin: Secondary | ICD-10-CM | POA: Diagnosis not present

## 2015-11-23 DIAGNOSIS — Z79899 Other long term (current) drug therapy: Secondary | ICD-10-CM | POA: Diagnosis not present

## 2015-11-23 DIAGNOSIS — K760 Fatty (change of) liver, not elsewhere classified: Secondary | ICD-10-CM | POA: Diagnosis not present

## 2015-11-23 DIAGNOSIS — Z4800 Encounter for change or removal of nonsurgical wound dressing: Secondary | ICD-10-CM | POA: Diagnosis not present

## 2015-11-23 DIAGNOSIS — I480 Paroxysmal atrial fibrillation: Secondary | ICD-10-CM | POA: Diagnosis not present

## 2015-11-23 DIAGNOSIS — Z7982 Long term (current) use of aspirin: Secondary | ICD-10-CM | POA: Diagnosis not present

## 2015-11-23 DIAGNOSIS — I2510 Atherosclerotic heart disease of native coronary artery without angina pectoris: Secondary | ICD-10-CM | POA: Diagnosis not present

## 2015-11-23 DIAGNOSIS — R238 Other skin changes: Secondary | ICD-10-CM | POA: Diagnosis not present

## 2015-11-23 DIAGNOSIS — Z7952 Long term (current) use of systemic steroids: Secondary | ICD-10-CM | POA: Diagnosis not present

## 2015-11-23 DIAGNOSIS — B372 Candidiasis of skin and nail: Secondary | ICD-10-CM | POA: Diagnosis not present

## 2015-11-23 DIAGNOSIS — I110 Hypertensive heart disease with heart failure: Secondary | ICD-10-CM | POA: Diagnosis not present

## 2015-11-23 DIAGNOSIS — I5043 Acute on chronic combined systolic (congestive) and diastolic (congestive) heart failure: Secondary | ICD-10-CM | POA: Diagnosis not present

## 2015-11-25 DIAGNOSIS — B372 Candidiasis of skin and nail: Secondary | ICD-10-CM | POA: Diagnosis not present

## 2015-11-25 DIAGNOSIS — I5043 Acute on chronic combined systolic (congestive) and diastolic (congestive) heart failure: Secondary | ICD-10-CM | POA: Diagnosis not present

## 2015-11-25 DIAGNOSIS — R238 Other skin changes: Secondary | ICD-10-CM | POA: Diagnosis not present

## 2015-11-25 DIAGNOSIS — Z7982 Long term (current) use of aspirin: Secondary | ICD-10-CM | POA: Diagnosis not present

## 2015-11-25 DIAGNOSIS — I480 Paroxysmal atrial fibrillation: Secondary | ICD-10-CM | POA: Diagnosis not present

## 2015-11-25 DIAGNOSIS — Z7952 Long term (current) use of systemic steroids: Secondary | ICD-10-CM | POA: Diagnosis not present

## 2015-11-25 DIAGNOSIS — Z4800 Encounter for change or removal of nonsurgical wound dressing: Secondary | ICD-10-CM | POA: Diagnosis not present

## 2015-11-25 DIAGNOSIS — I2510 Atherosclerotic heart disease of native coronary artery without angina pectoris: Secondary | ICD-10-CM | POA: Diagnosis not present

## 2015-11-25 DIAGNOSIS — I110 Hypertensive heart disease with heart failure: Secondary | ICD-10-CM | POA: Diagnosis not present

## 2015-11-25 DIAGNOSIS — K760 Fatty (change of) liver, not elsewhere classified: Secondary | ICD-10-CM | POA: Diagnosis not present

## 2015-11-25 DIAGNOSIS — Z79899 Other long term (current) drug therapy: Secondary | ICD-10-CM | POA: Diagnosis not present

## 2015-11-27 DIAGNOSIS — M25511 Pain in right shoulder: Secondary | ICD-10-CM | POA: Diagnosis not present

## 2015-11-27 DIAGNOSIS — R918 Other nonspecific abnormal finding of lung field: Secondary | ICD-10-CM | POA: Diagnosis not present

## 2015-11-27 DIAGNOSIS — M62838 Other muscle spasm: Secondary | ICD-10-CM | POA: Diagnosis not present

## 2015-11-27 DIAGNOSIS — M542 Cervicalgia: Secondary | ICD-10-CM | POA: Diagnosis not present

## 2015-12-04 DIAGNOSIS — R05 Cough: Secondary | ICD-10-CM | POA: Diagnosis not present

## 2015-12-04 DIAGNOSIS — J019 Acute sinusitis, unspecified: Secondary | ICD-10-CM | POA: Diagnosis not present

## 2015-12-04 DIAGNOSIS — H6692 Otitis media, unspecified, left ear: Secondary | ICD-10-CM | POA: Diagnosis not present

## 2015-12-22 DIAGNOSIS — J322 Chronic ethmoidal sinusitis: Secondary | ICD-10-CM | POA: Diagnosis not present

## 2015-12-22 DIAGNOSIS — H903 Sensorineural hearing loss, bilateral: Secondary | ICD-10-CM | POA: Diagnosis not present

## 2015-12-22 DIAGNOSIS — J410 Simple chronic bronchitis: Secondary | ICD-10-CM | POA: Diagnosis not present

## 2015-12-22 DIAGNOSIS — B370 Candidal stomatitis: Secondary | ICD-10-CM | POA: Diagnosis not present

## 2015-12-22 DIAGNOSIS — J320 Chronic maxillary sinusitis: Secondary | ICD-10-CM | POA: Diagnosis not present

## 2015-12-24 ENCOUNTER — Encounter: Payer: Self-pay | Admitting: Nurse Practitioner

## 2015-12-24 ENCOUNTER — Ambulatory Visit (INDEPENDENT_AMBULATORY_CARE_PROVIDER_SITE_OTHER): Payer: Medicare Other | Admitting: Nurse Practitioner

## 2015-12-24 VITALS — BP 128/84 | HR 81 | Ht 59.0 in | Wt 188.0 lb

## 2015-12-24 DIAGNOSIS — I4819 Other persistent atrial fibrillation: Secondary | ICD-10-CM

## 2015-12-24 DIAGNOSIS — I10 Essential (primary) hypertension: Secondary | ICD-10-CM | POA: Diagnosis not present

## 2015-12-24 DIAGNOSIS — I481 Persistent atrial fibrillation: Secondary | ICD-10-CM

## 2015-12-24 DIAGNOSIS — I5032 Chronic diastolic (congestive) heart failure: Secondary | ICD-10-CM

## 2015-12-24 NOTE — Progress Notes (Signed)
Electrophysiology Office Note Date: 12/24/2015  ID:  [name], DOB Oct 07, 1945, MRN [id number]  PCP: [name], MD Electrophysiologist: Rayann Heman  CC: AF follow up  [name] is a 70 y.o. female seen today for Dr Rayann Heman.  She presents today for routine electrophysiology followup.  Since last being seen in our clinic, the patient reports doing relatively well.  She was hospitalized in March of this year and is still recovering her strength. She is tearful about prolonged recovery and lack of energy.  She has had stable AF burden clinically.  Her RN at home stated that she felt she was in AF when she checked on her, but she has frequent atrial ectopy, and so I am not sure this was true AF.  She denies chest pain, PND, orthopnea, nausea, vomiting, dizziness, syncope, edema, weight gain, or early satiety.  Past Medical History  Diagnosis Date  . PAF (paroxysmal atrial fibrillation) (HCC)     a. CHA2DS2VASc = 5-->poor OAC candidate 2/2 falls.  . Essential hypertension   . Bilateral leg edema   . Internal hemorrhoids   . GERD (gastroesophageal reflux disease)   . Hiatal hernia   . Dermatomyositis ([place])   . Osteoarthritis   . Hypothyroidism   . Sleep apnea   . History of pancreatitis   . Chronic diastolic heart failure ([place])   . Shingles   . Gall stones     a. 08/2014 Abd U/S:  Large gallstone measuring 4.2 cm.  . Gout   . Deviated septum   . Hyperlipidemia   . Morbid obesity ([place])   . Continuous urine leakage   . CAD (coronary artery disease)     a. LHC 03/2006: EF 65%, mid LAD 40-50%, ostial D1 70-80%, normal circumflex, normal RCA;  b. Lexiscan Myoview 09/2010: No ischemia or scar, EF 75%;  c. Lex MV 5/14:  Normal, no ischemia, EF 71%  . Chronic diastolic CHF (congestive heart failure) ([place])     a. Echo 8/14:  Mild LVH, EF 55-65%, normal wall motion, Tr AI, mild MR, mod TR, PASP 45;  b. 12/2014 Echo: EF 60-65%, nl wall motion, mildly dil RA/LA, PASP 34mmHg.  Marland Kitchen History of  PFTs     a. PFTs 10/2012: normal.   . Cellulitis of right leg   . Spinal cord compression ([place]) 02/21/2014   Past Surgical History  Procedure Laterality Date  . Total abdominal hysterectomy    . Nasal septum surgery    . Back surgery  Sept 7 and May 30, 2014    Current Outpatient Prescriptions  Medication Sig Dispense Refill  . acetaminophen (TYLENOL) 500 MG tablet Take 500-1,000 mg by mouth every 6 (six) hours as needed for mild pain or fever.     Marland Kitchen acidophilus (RISAQUAD) CAPS capsule Take 2 capsules by mouth daily. 30 capsule 0  . alendronate (FOSAMAX) 70 MG tablet Take 1 tablet by mouth every Tuesday.     Marland Kitchen aspirin EC 325 MG tablet Take 325 mg by mouth daily.    . cetirizine (ZYRTEC) 10 MG tablet Take 10 mg by mouth daily.    . Cholecalciferol (VITAMIN D3) 2000 UNITS TABS Take 2,000 Units by mouth daily.     . fluticasone (FLONASE) 50 MCG/ACT nasal spray Place 1 spray into both nostrils as directed.    Marland Kitchen FOLIC ACID PO Take 1 tablet by mouth 4 (four) times daily.     . furosemide (LASIX) 40 MG tablet Take 1 tablet (40  mg total) by mouth 2 (two) times daily. 120 tablet 5  . hydroxychloroquine (PLAQUENIL) 200 MG tablet Take 200 mg by mouth 2 (two) times daily.     Marland Kitchen levothyroxine (SYNTHROID, LEVOTHROID) 137 MCG tablet Take 1 tablet by mouth daily before breakfast.  5  . methotrexate (RHEUMATREX) 2.5 MG tablet Take 5 tablets by mouth every Wednesday.   3  . metoprolol succinate (TOPROL-XL) 25 MG 24 hr tablet Take 1 tablet by mouth  daily 90 tablet 2  . Multiple Vitamin (MULTIVITAMIN) tablet Take 1 tablet by mouth daily.      . nitroGLYCERIN (NITROSTAT) 0.4 MG SL tablet Place 0.4 mg under the tongue as directed.    . nystatin (MYCOSTATIN/NYSTOP) 100000 UNIT/GM POWD Apply 1 g topically 2 (two) times daily. Applies to stomach    . OVER THE COUNTER MEDICATION Place 1 drop into both eyes daily as needed (for dry eyes. Advanced eye relief).    . pantoprazole (PROTONIX) 40 MG tablet Take 1  tablet (40 mg total) by mouth 2 (two) times daily before a meal. 180 tablet 3  . potassium chloride SA (KLOR-CON M20) 20 MEQ tablet Take 1 tablet (20 mEq total) by mouth 3 (three) times daily. 60 tablet 11  . predniSONE (DELTASONE) 5 MG tablet Take 5 mg by mouth daily with breakfast.     No current facility-administered medications for this visit.    Allergies:   Butoconazole; Lipitor; and Keflex   Social History: Social History   Social History  . Marital Status: Married    Spouse Name: N/A  . Number of Children: 2  . Years of Education: N/A   Occupational History  .     Social History Main Topics  . Smoking status: Never Smoker   . Smokeless tobacco: Never Used  . Alcohol Use: No  . Drug Use: No  . Sexual Activity: No   Other Topics Concern  . Not on file   Social History Narrative    Family History: Family History  Problem Relation Age of Onset  . Heart attack Brother     x 2  . Coronary artery disease Father   . Colon cancer Paternal Uncle   . Kidney disease Brother   . Stroke Neg Hx   . Hypertension Brother     Review of Systems: All other systems reviewed and are otherwise negative except as noted above.   Physical Exam: VS:  BP 128/84 mmHg  Pulse 81  Ht 4\' 11"  (1.499 m)  Wt 188 lb (85.276 kg)  BMI 37.95 kg/m2 , BMI Body mass index is 37.95 kg/(m^2). Wt Readings from Last 3 Encounters:  12/24/15 188 lb (85.276 kg)  08/18/15 217 lb (98.431 kg)  08/07/15 209 lb (94.802 kg)    GEN- The patient is elderly and morbidly obese appearing, alert and oriented x 3 today.   HEENT: normocephalic, atraumatic; sclera clear, conjunctiva pink; hearing intact; oropharynx clear; neck supple Lungs- Clear to ausculation bilaterally, normal work of breathing.  No wheezes, rales, rhonchi Heart- Regular rate and rhythm with frequent ectopy  GI- obese, non-tender, non-distended, bowel sounds present  Extremities- no clubbing, cyanosis, bilateral leg wraps MS- no  significant deformity or atrophy Skin- warm and dry, no rash or lesion  Psych- euthymic mood, full affect Neuro- strength and sensation are intact   EKG:  EKG is ordered today. The ekg ordered today shows sinus rhythm with sinus arrhythmia  Recent Labs: 08/13/2015: B Natriuretic Peptide 304.9*; Magnesium 1.7 08/14/2015: TSH  2.810 08/17/2015: BUN 14; Creatinine, Ser 1.08*; Hemoglobin 10.1*; Platelets 146*; Potassium 4.0; Sodium 137    Other studies Reviewed: Additional studies/ records that were reviewed today include: Dr Jackalyn [place] office notes  Assessment and Plan: 1.  Persistent atrial fibrillation Amiodarone previously discontinued 2/2 tremor Burden by symptoms stable CHADS2VASC is 4, no OAC 2/2 falls.  Watchman deferred for now in setting of ongoing other medical issues and obesity If AF burden increases, Tikosyn or Sotalol would be options   2.  CAD No recent ischemic symptoms Continue medical therapy   3.  Morbid obesity Body mass index is 37.95 kg/(m^2). Weight loss encouraged  4.  Chronic diastolic heart failure Stable Continue current therapy    Current medicines are reviewed at length with the patient today.   The patient does not have concerns regarding her medicines.  The following changes were made today:  none  Labs/ tests ordered today include: none  Orders Placed This Encounter  Procedures  . EKG 12-Lead     Disposition:   Follow up with Dr Rayann Heman in 6 months      Signed, Chanetta Marshall, NP 12/24/2015 3:44 PM   [facility] [address] [place] [address] [phone] (office) [phone] (fax)

## 2015-12-24 NOTE — Patient Instructions (Addendum)
Medication Instructions:  Your physician recommends that you continue on your current medications as directed. Please refer to the Current Medication list given to you today.    If you need a refill on your cardiac medications before your next appointment, please call your pharmacy.  Labwork:  NONE ORDER TODAY    Testing/Procedures:  NONE ORDER TODAY    Follow-Up:  Your physician wants you to follow-up in:  IN  6  MONTHS WITH DR ALLRED You will receive a reminder letter in the mail two months in advance. If you don't receive a letter, please call our office to schedule the follow-up appointment.      Any Other Special Instructions Will Be Listed Below (If Applicable).                                                                                                                                                   

## 2015-12-28 ENCOUNTER — Ambulatory Visit: Payer: Medicare Other | Admitting: Nurse Practitioner

## 2015-12-29 DIAGNOSIS — J322 Chronic ethmoidal sinusitis: Secondary | ICD-10-CM | POA: Diagnosis not present

## 2015-12-29 DIAGNOSIS — J320 Chronic maxillary sinusitis: Secondary | ICD-10-CM | POA: Diagnosis not present

## 2015-12-29 DIAGNOSIS — J040 Acute laryngitis: Secondary | ICD-10-CM | POA: Diagnosis not present

## 2015-12-31 DIAGNOSIS — M150 Primary generalized (osteo)arthritis: Secondary | ICD-10-CM | POA: Diagnosis not present

## 2015-12-31 DIAGNOSIS — M0579 Rheumatoid arthritis with rheumatoid factor of multiple sites without organ or systems involvement: Secondary | ICD-10-CM | POA: Diagnosis not present

## 2015-12-31 DIAGNOSIS — M3312 Other dermatopolymyositis with myopathy: Secondary | ICD-10-CM | POA: Diagnosis not present

## 2015-12-31 DIAGNOSIS — Z79899 Other long term (current) drug therapy: Secondary | ICD-10-CM | POA: Diagnosis not present

## 2016-01-18 ENCOUNTER — Encounter: Payer: Self-pay | Admitting: Gastroenterology

## 2016-01-26 ENCOUNTER — Other Ambulatory Visit: Payer: Self-pay | Admitting: Internal Medicine

## 2016-02-08 ENCOUNTER — Ambulatory Visit (INDEPENDENT_AMBULATORY_CARE_PROVIDER_SITE_OTHER): Payer: Medicare Other | Admitting: Pulmonary Disease

## 2016-02-08 ENCOUNTER — Encounter: Payer: Self-pay | Admitting: Pulmonary Disease

## 2016-02-08 VITALS — BP 142/90 | HR 76 | Ht 59.0 in | Wt 190.6 lb

## 2016-02-08 DIAGNOSIS — R059 Cough, unspecified: Secondary | ICD-10-CM

## 2016-02-08 DIAGNOSIS — Z23 Encounter for immunization: Secondary | ICD-10-CM

## 2016-02-08 DIAGNOSIS — R05 Cough: Secondary | ICD-10-CM

## 2016-02-08 DIAGNOSIS — R0609 Other forms of dyspnea: Secondary | ICD-10-CM | POA: Diagnosis not present

## 2016-02-08 DIAGNOSIS — R0600 Dyspnea, unspecified: Secondary | ICD-10-CM

## 2016-02-08 NOTE — Progress Notes (Signed)
[name]    [id number]    09-Dec-1945  Primary Care Physician:MACKENZIE,BRIAN, MD  Referring Physician: [name], MD [address], [place], [place] [address]  Chief complaint:  Follow up for cough, bronchospasm.  PROBLEM LIST: Cough, wheezing Dermatomyositis Mod Pulm HTN OSA. Non compliant   HPI: [name] is a 70 year old with a complex past medical history which includes dermatomyositis, moderate pulmonary hypertension, OSA [noncompliant with CPAP], atrial fibrillation.  She complains of chronic nonproductive cough. Problem has been going on for several years. This is occasionally associated with dyspnea and wheezing. She says that exposure to strong perfumes and cold air, heat, humidity makes his symptoms worse. She has spring and fall allergies. At present she denies any rhinitis, postnasal drip, sinus pain or pressure. She has history of GERD and is on omeprazole for that. She has history of dermatomyositis and is maintained on methotrexate, Plaquenil and low-dose prednisone (5 mg). She has history of hiatal hernia, esophageal dysmotility, dysphagia. She has episodes of choking on food.  She denies any sputum production, hemoptysis. She is admitted in March 2017 with lower extremity cellulitis. She had a CT scan at that time that showed small left lower lobe pneumonia. She also had a right subpleural pulmonary nodule that needs to be followed up. She is doing well post discharge.  She was diagnosed with OSA many years ago. She uses the CPAP for a while but then stopped using it since it got very uncomfortable and she felt that the CPAP is blowing air into her hiatal hernia and stomach. She denies any snoring or daytime somnolence.   Outpatient Encounter Prescriptions as of 02/08/2016  Medication Sig  . acetaminophen (TYLENOL) 500 MG tablet Take 500-1,000 mg by mouth every 6 (six) hours as needed for mild pain or fever.   Marland Kitchen acidophilus  (RISAQUAD) CAPS capsule Take 2 capsules by mouth daily.  Marland Kitchen alendronate (FOSAMAX) 70 MG tablet Take 1 tablet by mouth every Tuesday.   Marland Kitchen allopurinol (ZYLOPRIM) 100 MG tablet Take 100 mg by mouth daily.  Marland Kitchen aspirin EC 325 MG tablet Take 325 mg by mouth daily.  . cetirizine (ZYRTEC) 10 MG tablet Take 10 mg by mouth daily.  . Cholecalciferol (VITAMIN D3) 2000 UNITS TABS Take 2,000 Units by mouth daily.   . fluticasone (FLONASE) 50 MCG/ACT nasal spray Place 1 spray into both nostrils as directed.  Marland Kitchen FOLIC ACID PO Take 1 tablet by mouth 4 (four) times daily.   . furosemide (LASIX) 40 MG tablet Take 1 tablet (40 mg total) by mouth 2 (two) times daily.  . hydroxychloroquine (PLAQUENIL) 200 MG tablet Take 200 mg by mouth 2 (two) times daily.   Marland Kitchen levothyroxine (SYNTHROID, LEVOTHROID) 137 MCG tablet Take 1 tablet by mouth daily before breakfast.  . methotrexate (RHEUMATREX) 2.5 MG tablet Take 5 tablets by mouth every Wednesday.   . metoprolol succinate (TOPROL-XL) 25 MG 24 hr tablet Take 1 tablet by mouth  daily  . Multiple Vitamin (MULTIVITAMIN) tablet Take 1 tablet by mouth daily.    . nitroGLYCERIN (NITROSTAT) 0.4 MG SL tablet Place 0.4 mg under the tongue as directed.  . nystatin (MYCOSTATIN/NYSTOP) 100000 UNIT/GM POWD Apply 1 g topically 2 (two) times daily. Applies to stomach  . OVER THE COUNTER MEDICATION Place 1 drop into both eyes daily as needed (for dry eyes. Advanced eye relief).  . pantoprazole (PROTONIX) 40 MG tablet Take 1 tablet (40 mg total) by mouth  2 (two) times daily before a meal.  . potassium chloride SA (KLOR-CON M20) 20 MEQ tablet Take 1 tablet (20 mEq total) by mouth 3 (three) times daily.  . predniSONE (DELTASONE) 5 MG tablet Take 5 mg by mouth daily with breakfast.   No facility-administered encounter medications on file as of 02/08/2016.     Allergies as of 02/08/2016 - Review Complete 02/08/2016  Allergen Reaction Noted  . Butoconazole Itching, Rash, and Other (See Comments)  09/23/2010  . Lipitor [atorvastatin calcium] Other (See Comments) 08/17/2011  . Keflex [cephalexin] Diarrhea 03/13/2015    Past Medical History:  Diagnosis Date  . Bilateral leg edema   . CAD (coronary artery disease)    a. LHC 03/2006: EF 65%, mid LAD 40-50%, ostial D1 70-80%, normal circumflex, normal RCA;  b. Lexiscan Myoview 09/2010: No ischemia or scar, EF 75%;  c. Lex MV 5/14:  Normal, no ischemia, EF 71%  . Cellulitis of right leg   . Chronic diastolic CHF (congestive heart failure) ([place])    a. Echo 8/14:  Mild LVH, EF 55-65%, normal wall motion, Tr AI, mild MR, mod TR, PASP 45;  b. 12/2014 Echo: EF 60-65%, nl wall motion, mildly dil RA/LA, PASP 15mmHg.  Marland Kitchen Chronic diastolic heart failure ([place])   . Continuous urine leakage   . Dermatomyositis ([place])   . Deviated septum   . Essential hypertension   . Gall stones    a. 08/2014 Abd U/S:  Large gallstone measuring 4.2 cm.  Marland Kitchen GERD (gastroesophageal reflux disease)   . Gout   . Hiatal hernia   . History of pancreatitis   . History of PFTs    a. PFTs 10/2012: normal.   . Hyperlipidemia   . Hypothyroidism   . Internal hemorrhoids   . Morbid obesity ([place])   . Osteoarthritis   . PAF (paroxysmal atrial fibrillation) (HCC)    a. CHA2DS2VASc = 5-->poor OAC candidate 2/2 falls.  . Shingles   . Sleep apnea   . Spinal cord compression ([place]) 02/21/2014    Past Surgical History:  Procedure Laterality Date  . BACK SURGERY  Sept 7 and May 30, 2014  . NASAL SEPTUM SURGERY    . TOTAL ABDOMINAL HYSTERECTOMY      Family History  Problem Relation Age of Onset  . Heart attack Brother     x 2  . Coronary artery disease Father   . Colon cancer Paternal Uncle   . Kidney disease Brother   . Stroke Neg Hx   . Hypertension Brother     Social History   Social History  . Marital status: Married    Spouse name: N/A  . Number of children: 2  . Years of education: N/A   Occupational History  .  Unemployed   Social History Main Topics    . Smoking status: Never Smoker  . Smokeless tobacco: Never Used  . Alcohol use No  . Drug use: No  . Sexual activity: No   Other Topics Concern  . Not on file   Social History Narrative  . No narrative on file   Review of systems: Review of Systems  Constitutional: Negative for fever and chills.  HENT: Negative.   Eyes: Negative for blurred vision.  Respiratory: as per HPI  Cardiovascular: Negative for chest pain and palpitations.  Gastrointestinal: Negative for vomiting, diarrhea, blood per rectum. Genitourinary: Negative for dysuria, urgency, frequency and hematuria.  Musculoskeletal: Negative for myalgias, back pain and joint pain.  Skin: Negative  for itching and rash.  Neurological: Negative for dizziness, tremors, focal weakness, seizures and loss of consciousness.  Endo/Heme/Allergies: Negative for environmental allergies.  Psychiatric/Behavioral: Negative for depression, suicidal ideas and hallucinations.  All other systems reviewed and are negative.   Physical Exam: Blood pressure (!) 142/90, pulse 76, height 4\' 11"  (1.499 m), weight 190 lb 9.6 oz (86.5 kg), SpO2 100 %. Gen:      No acute distress HEENT:  EOMI, sclera anicteric Neck:     No masses; no thyromegaly Lungs:    Clear to auscultation bilaterally; normal respiratory effort CV:         Regular rate and rhythm; no murmurs Abd:      + bowel sounds; soft, non-tender; no palpable masses, no distension Ext:    No edema; adequate peripheral perfusion Skin:      Warm and dry; no rash Neuro: alert and oriented x 3 Psych: normal mood and affect  Data Reviewed: PFTs (10/26/12) FVC 2.27 (89%) FEV1 1.89 (98%) TLC 86% DLCO 97% No bronchodilator response. Normal spirometry, normal lung volumes, normal diffusion.  PFTs [07/01/15] FVC 1.41 (57%) FEV1 1.14 (61%) F/F 81 TLC 60% DLCO 60% Severe restriction, moderate reduction in diffusion capacity, no bronchodilator response  Echo (12/19/14) - Left ventricle:  The cavity size was normal. Wall thickness was normal. Systolic function was normal. The estimated ejection fraction was in the range of 60% to 65%. Wall motion was normal; there were no regional wall motion abnormalities. - Left atrium: The atrium was mildly dilated. - Right atrium: The atrium was mildly dilated. - Pulmonary arteries: Systolic pressure was moderately increased. PA peak pressure: 42 mm Hg (S).  CT scan 07/07/15 Images reviewed. No interstitial lung disease. Bronchiectasis, bronchial wall thickening  CT scan 08/13/15 New left lower lobe mild infiltrate. Stable subpleural nodules  Assessment:  #1 Chronic cough. Cough is likely due to dysphagia, esophageal dysmotility, related to dermatomyositis. She does have GERD, seasonal allergies and rhinitis that may be contributing to the problem. Symptoms have improved after she was started on flonase and protonix increased to bid. Consider GI referral for esophageal molility testing if cough gets worse.  I do not suspect reactive airway disease/asthma. She was given albuterol PRN and Flovent last year but she has not used them as they were too expensive through her insurance. Besides she did not tolerate the albuterol because it make her jittery.  #2 Dermatomyositis. She's is on methotrexate and Plaquenil, chronic prednisone. Her PFTs show a new reduction in lung volumes and diffusion capacity however the CT scan does not show any ILD. I suspect that the PFT changes are due to her obesity and body habitus.   Recent CT scan shows stable subpleural nodules which will need follow up later this year.  #2 Moderate pulmonary hypertension. Likely related to her untreated OSA. Her PA pressures have remained stable. We'll continue to monitor.  #3 OSA This has been untreated for several years. She is intolerant of CPAP. At present she does not have any symptoms related to OSA.  Plan/Recommendations: - Continue flonase and  protonix to 40 mg twice a day - Continue methorexate, plaquinel, prednisone - Flu vaccination today.  Return in 6 months  Marshell Garfinkel MD [facility] Pager [phone] 02/08/2016, 9:55 AM  CC: [name], MD

## 2016-02-08 NOTE — Patient Instructions (Signed)
Continue your inhalers and medications including methotrexate and prednisone. We will give a flu vaccination today.  Return to clinic in 6 months.

## 2016-02-23 ENCOUNTER — Telehealth: Payer: Self-pay | Admitting: Gastroenterology

## 2016-02-23 NOTE — Telephone Encounter (Signed)
OK 

## 2016-02-24 ENCOUNTER — Encounter: Payer: Self-pay | Admitting: Gastroenterology

## 2016-02-24 NOTE — Telephone Encounter (Signed)
Pt is scheduled for colon on 04-27-16.  Letter mailed

## 2016-03-16 DIAGNOSIS — H53002 Unspecified amblyopia, left eye: Secondary | ICD-10-CM | POA: Diagnosis not present

## 2016-03-16 DIAGNOSIS — Z79899 Other long term (current) drug therapy: Secondary | ICD-10-CM | POA: Diagnosis not present

## 2016-03-16 DIAGNOSIS — H04123 Dry eye syndrome of bilateral lacrimal glands: Secondary | ICD-10-CM | POA: Diagnosis not present

## 2016-03-23 DIAGNOSIS — Z0000 Encounter for general adult medical examination without abnormal findings: Secondary | ICD-10-CM | POA: Diagnosis not present

## 2016-03-23 DIAGNOSIS — M810 Age-related osteoporosis without current pathological fracture: Secondary | ICD-10-CM | POA: Diagnosis not present

## 2016-03-23 DIAGNOSIS — E559 Vitamin D deficiency, unspecified: Secondary | ICD-10-CM | POA: Diagnosis not present

## 2016-03-23 DIAGNOSIS — I129 Hypertensive chronic kidney disease with stage 1 through stage 4 chronic kidney disease, or unspecified chronic kidney disease: Secondary | ICD-10-CM | POA: Diagnosis not present

## 2016-03-23 DIAGNOSIS — M109 Gout, unspecified: Secondary | ICD-10-CM | POA: Diagnosis not present

## 2016-03-23 DIAGNOSIS — Z1389 Encounter for screening for other disorder: Secondary | ICD-10-CM | POA: Diagnosis not present

## 2016-03-28 DIAGNOSIS — I10 Essential (primary) hypertension: Secondary | ICD-10-CM | POA: Diagnosis not present

## 2016-03-28 DIAGNOSIS — I5032 Chronic diastolic (congestive) heart failure: Secondary | ICD-10-CM | POA: Diagnosis not present

## 2016-03-28 DIAGNOSIS — E039 Hypothyroidism, unspecified: Secondary | ICD-10-CM | POA: Diagnosis not present

## 2016-03-28 DIAGNOSIS — E785 Hyperlipidemia, unspecified: Secondary | ICD-10-CM | POA: Diagnosis not present

## 2016-03-31 DIAGNOSIS — M3312 Other dermatopolymyositis with myopathy: Secondary | ICD-10-CM | POA: Diagnosis not present

## 2016-03-31 DIAGNOSIS — M0579 Rheumatoid arthritis with rheumatoid factor of multiple sites without organ or systems involvement: Secondary | ICD-10-CM | POA: Diagnosis not present

## 2016-03-31 DIAGNOSIS — M150 Primary generalized (osteo)arthritis: Secondary | ICD-10-CM | POA: Diagnosis not present

## 2016-03-31 DIAGNOSIS — Z79899 Other long term (current) drug therapy: Secondary | ICD-10-CM | POA: Diagnosis not present

## 2016-04-13 ENCOUNTER — Ambulatory Visit: Payer: Medicare Other | Admitting: *Deleted

## 2016-04-13 ENCOUNTER — Encounter: Payer: Self-pay | Admitting: Gastroenterology

## 2016-04-13 VITALS — Ht <= 58 in | Wt 190.0 lb

## 2016-04-13 DIAGNOSIS — M3390 Dermatopolymyositis, unspecified, organ involvement unspecified: Secondary | ICD-10-CM

## 2016-04-13 MED ORDER — SUPREP BOWEL PREP KIT 17.5-3.13-1.6 GM/177ML PO SOLN: 1.0000 | Freq: Once | ORAL | 0 refills | Status: AC

## 2016-04-13 NOTE — Progress Notes (Signed)
Patient denies any allergies to egg or soy products. Patient denies complications with anesthesia/sedation.  Patient denies oxygen use at home and denies diet medications. Emmi instructions for colonoscopy but patient denied. Patient has Sleep Apnea but does not use CPAP.

## 2016-04-13 NOTE — Progress Notes (Signed)
Patient wears bilateral hearing aids

## 2016-04-20 DIAGNOSIS — M7989 Other specified soft tissue disorders: Secondary | ICD-10-CM | POA: Diagnosis not present

## 2016-04-20 DIAGNOSIS — M0579 Rheumatoid arthritis with rheumatoid factor of multiple sites without organ or systems involvement: Secondary | ICD-10-CM | POA: Diagnosis not present

## 2016-04-20 DIAGNOSIS — Z79899 Other long term (current) drug therapy: Secondary | ICD-10-CM | POA: Diagnosis not present

## 2016-04-20 DIAGNOSIS — M3312 Other dermatopolymyositis with myopathy: Secondary | ICD-10-CM | POA: Diagnosis not present

## 2016-04-20 DIAGNOSIS — M79671 Pain in right foot: Secondary | ICD-10-CM | POA: Diagnosis not present

## 2016-04-20 DIAGNOSIS — M150 Primary generalized (osteo)arthritis: Secondary | ICD-10-CM | POA: Diagnosis not present

## 2016-04-20 DIAGNOSIS — M79672 Pain in left foot: Secondary | ICD-10-CM | POA: Diagnosis not present

## 2016-04-27 ENCOUNTER — Encounter: Payer: Medicare Other | Admitting: Gastroenterology

## 2016-05-24 ENCOUNTER — Other Ambulatory Visit: Payer: Self-pay | Admitting: Internal Medicine

## 2016-05-24 DIAGNOSIS — R109 Unspecified abdominal pain: Secondary | ICD-10-CM

## 2016-05-24 DIAGNOSIS — M109 Gout, unspecified: Secondary | ICD-10-CM | POA: Diagnosis not present

## 2016-05-24 DIAGNOSIS — M810 Age-related osteoporosis without current pathological fracture: Secondary | ICD-10-CM | POA: Diagnosis not present

## 2016-05-24 DIAGNOSIS — R197 Diarrhea, unspecified: Secondary | ICD-10-CM | POA: Diagnosis not present

## 2016-05-24 DIAGNOSIS — R140 Abdominal distension (gaseous): Secondary | ICD-10-CM | POA: Diagnosis not present

## 2016-05-24 DIAGNOSIS — R110 Nausea: Secondary | ICD-10-CM

## 2016-05-25 ENCOUNTER — Ambulatory Visit
Admission: RE | Admit: 2016-05-25 | Discharge: 2016-05-25 | Disposition: A | Discharge status: Home / Self Care | Payer: Medicare Other | Source: Ambulatory Visit | Attending: Internal Medicine | Admitting: Internal Medicine

## 2016-05-25 DIAGNOSIS — K8020 Calculus of gallbladder without cholecystitis without obstruction: Secondary | ICD-10-CM | POA: Diagnosis not present

## 2016-05-25 DIAGNOSIS — R197 Diarrhea, unspecified: Secondary | ICD-10-CM | POA: Diagnosis not present

## 2016-05-25 DIAGNOSIS — R109 Unspecified abdominal pain: Secondary | ICD-10-CM

## 2016-05-25 DIAGNOSIS — R140 Abdominal distension (gaseous): Secondary | ICD-10-CM

## 2016-05-25 DIAGNOSIS — R110 Nausea: Secondary | ICD-10-CM

## 2016-05-25 MED ORDER — IOPAMIDOL (ISOVUE-300) INJECTION 61%
100.0000 mL | Freq: Once | INTRAVENOUS | Status: AC | PRN
  Administered 2016-05-25: 100 mL via INTRAVENOUS

## 2016-05-31 ENCOUNTER — Other Ambulatory Visit: Payer: Self-pay | Admitting: Internal Medicine

## 2016-05-31 DIAGNOSIS — R945 Abnormal results of liver function studies: Secondary | ICD-10-CM

## 2016-06-01 ENCOUNTER — Encounter: Payer: Self-pay | Admitting: *Deleted

## 2016-06-14 ENCOUNTER — Other Ambulatory Visit: Payer: Self-pay | Admitting: Pulmonary Disease

## 2016-06-14 ENCOUNTER — Ambulatory Visit
Admission: RE | Admit: 2016-06-14 | Discharge: 2016-06-14 | Disposition: A | Discharge status: Home / Self Care | Payer: Medicare Other | Source: Ambulatory Visit | Attending: Internal Medicine | Admitting: Internal Medicine

## 2016-06-14 DIAGNOSIS — R945 Abnormal results of liver function studies: Secondary | ICD-10-CM | POA: Diagnosis not present

## 2016-06-22 ENCOUNTER — Ambulatory Visit: Payer: Medicare Other | Admitting: Internal Medicine

## 2016-06-23 DIAGNOSIS — M109 Gout, unspecified: Secondary | ICD-10-CM | POA: Diagnosis not present

## 2016-06-23 DIAGNOSIS — D7589 Other specified diseases of blood and blood-forming organs: Secondary | ICD-10-CM | POA: Diagnosis not present

## 2016-06-23 DIAGNOSIS — R899 Unspecified abnormal finding in specimens from other organs, systems and tissues: Secondary | ICD-10-CM | POA: Diagnosis not present

## 2016-06-23 DIAGNOSIS — I10 Essential (primary) hypertension: Secondary | ICD-10-CM | POA: Diagnosis not present

## 2016-06-23 DIAGNOSIS — M810 Age-related osteoporosis without current pathological fracture: Secondary | ICD-10-CM | POA: Diagnosis not present

## 2016-06-28 DIAGNOSIS — N183 Chronic kidney disease, stage 3 (moderate): Secondary | ICD-10-CM | POA: Diagnosis not present

## 2016-06-28 DIAGNOSIS — I10 Essential (primary) hypertension: Secondary | ICD-10-CM | POA: Diagnosis not present

## 2016-06-28 DIAGNOSIS — I5032 Chronic diastolic (congestive) heart failure: Secondary | ICD-10-CM | POA: Diagnosis not present

## 2016-07-04 DIAGNOSIS — M3312 Other dermatopolymyositis with myopathy: Secondary | ICD-10-CM | POA: Diagnosis not present

## 2016-07-04 DIAGNOSIS — Z79899 Other long term (current) drug therapy: Secondary | ICD-10-CM | POA: Diagnosis not present

## 2016-07-04 DIAGNOSIS — M150 Primary generalized (osteo)arthritis: Secondary | ICD-10-CM | POA: Diagnosis not present

## 2016-07-04 DIAGNOSIS — M0579 Rheumatoid arthritis with rheumatoid factor of multiple sites without organ or systems involvement: Secondary | ICD-10-CM | POA: Diagnosis not present

## 2016-07-14 ENCOUNTER — Ambulatory Visit: Payer: Medicare Other | Admitting: Internal Medicine

## 2016-07-18 ENCOUNTER — Ambulatory Visit (INDEPENDENT_AMBULATORY_CARE_PROVIDER_SITE_OTHER): Payer: Medicare Other | Admitting: Internal Medicine

## 2016-07-18 VITALS — BP 120/80 | HR 86 | Ht 59.0 in | Wt 182.0 lb

## 2016-07-18 DIAGNOSIS — I481 Persistent atrial fibrillation: Secondary | ICD-10-CM

## 2016-07-18 DIAGNOSIS — I10 Essential (primary) hypertension: Secondary | ICD-10-CM

## 2016-07-18 DIAGNOSIS — I5032 Chronic diastolic (congestive) heart failure: Secondary | ICD-10-CM | POA: Diagnosis not present

## 2016-07-18 DIAGNOSIS — I2510 Atherosclerotic heart disease of native coronary artery without angina pectoris: Secondary | ICD-10-CM

## 2016-07-18 DIAGNOSIS — I4819 Other persistent atrial fibrillation: Secondary | ICD-10-CM

## 2016-07-18 NOTE — Patient Instructions (Signed)
Medication Instructions:  Your physician recommends that you continue on your current medications as directed. Please refer to the Current Medication list given to you today.   Labwork: None ordered   Testing/Procedures: None ordered   Follow-Up: Your physician wants you to follow-up in: 6 months with Amber Seiler, NP You will receive a reminder letter in the mail two months in advance. If you don't receive a letter, please call our office to schedule the follow-up appointment.   Any Other Special Instructions Will Be Listed Below (If Applicable).     If you need a refill on your cardiac medications before your next appointment, please call your pharmacy.   

## 2016-07-18 NOTE — Progress Notes (Signed)
PCP: [name], MD Primary Cardiologist: previously Dr Tasia Catchings is a 71 y.o. female who presents today for routine electrophysiology followup.  Since last being seen in our clinic, the patient reports doing reasonably well.  AF is well controlled.  She is now walking with a rolling walker and is not in the wheelchair.  Today, she denies symptoms of palpitations, chest pain, shortness of breath (above baseline),  lower extremity edema (above baseline), dizziness, presyncope, or syncope.  The patient is otherwise without complaint today.   Past Medical History:  Diagnosis Date  . Allergy   . Anemia   . Bilateral leg edema   . CAD (coronary artery disease)    a. LHC 03/2006: EF 65%, mid LAD 40-50%, ostial D1 70-80%, normal circumflex, normal RCA;  b. Lexiscan Myoview 09/2010: No ischemia or scar, EF 75%;  c. Lex MV 5/14:  Normal, no ischemia, EF 71%  . Cataract    bilateral-had surgery  . Cellulitis of right leg    Hx - resolved  . Chronic diastolic CHF (congestive heart failure) ([place])    a. Echo 8/14:  Mild LVH, EF 55-65%, normal wall motion, Tr AI, mild MR, mod TR, PASP 45;  b. 12/2014 Echo: EF 60-65%, nl wall motion, mildly dil RA/LA, PASP 75mmHg.  Marland Kitchen Chronic diastolic heart failure ([place])   . Continuous urine leakage   . Dermatomyositis ([place])   . Deviated septum   . Essential hypertension   . Gall stones    a. 08/2014 Abd U/S:  Large gallstone measuring 4.2 cm.  Marland Kitchen GERD (gastroesophageal reflux disease)   . Gout   . Hiatal hernia   . History of pancreatitis   . History of PFTs    a. PFTs 10/2012: normal.   . Hyperlipidemia   . Hypothyroidism   . Internal hemorrhoids   . Morbid obesity ([place])   . Osteoarthritis    spine hips knees  . PAF (paroxysmal atrial fibrillation) ([facility])    a. CHA2DS2VASc = 5-->poor OAC candidate 2/2 falls. No problems now per patient 03/2016  . Shingles    Hx  . Sleep apnea    Does not use CPAP  . Spinal cord compression ([place]) 02/21/2014     Past Surgical History:  Procedure Laterality Date  . BACK SURGERY  Sept 7 and May 30, 2014   T11/T12  . cataract surgery Bilateral   . COLONOSCOPY  03/2011  . NASAL SEPTUM SURGERY    . TOTAL ABDOMINAL HYSTERECTOMY    . WISDOM TOOTH EXTRACTION      Current Outpatient Prescriptions  Medication Sig Dispense Refill  . acetaminophen (TYLENOL) 500 MG tablet Take 500-1,000 mg by mouth every 6 (six) hours as needed for mild pain or fever.     Marland Kitchen acidophilus (RISAQUAD) CAPS capsule Take 2 capsules by mouth daily. 30 capsule 0  . alendronate (FOSAMAX) 70 MG tablet Take 1 tablet by mouth every Tuesday.     Marland Kitchen allopurinol (ZYLOPRIM) 100 MG tablet Take 100 mg by mouth daily.    Marland Kitchen aspirin EC 325 MG tablet Take 325 mg by mouth daily.    . cetirizine (ZYRTEC) 10 MG tablet Take 10 mg by mouth daily.    . Cholecalciferol (VITAMIN D3) 2000 UNITS TABS Take 2,000 Units by mouth daily.     . fluticasone (FLONASE) 50 MCG/ACT nasal spray Place 1 spray into both nostrils as directed.    Marland Kitchen FOLIC ACID PO Take 1 tablet by mouth 4 (four)  times daily.     . furosemide (LASIX) 40 MG tablet Take 1 tablet (40 mg total) by mouth 2 (two) times daily. 120 tablet 5  . hydroxychloroquine (PLAQUENIL) 200 MG tablet Take 200 mg by mouth 2 (two) times daily.     Marland Kitchen levothyroxine (SYNTHROID, LEVOTHROID) 137 MCG tablet Take 1 tablet by mouth daily before breakfast.  5  . methotrexate (RHEUMATREX) 2.5 MG tablet Take 5 tablets by mouth every Wednesday.   3  . metoprolol succinate (TOPROL-XL) 25 MG 24 hr tablet Take 1 tablet by mouth  daily 90 tablet 3  . Multiple Vitamin (MULTIVITAMIN) tablet Take 1 tablet by mouth daily.      . nitroGLYCERIN (NITROSTAT) 0.4 MG SL tablet Place 0.4 mg under the tongue as directed.    . nystatin (MYCOSTATIN/NYSTOP) 100000 UNIT/GM POWD Apply 1 g topically 2 (two) times daily. Applies to stomach    . OVER THE COUNTER MEDICATION Place 1 drop into both eyes daily as needed (for dry eyes. Advanced eye  relief).    . pantoprazole (PROTONIX) 40 MG tablet TAKE 1 TABLET BY MOUTH 2  TIMES DAILY BEFORE MEAL 180 tablet 3  . potassium chloride SA (KLOR-CON M20) 20 MEQ tablet Take 1 tablet (20 mEq total) by mouth 3 (three) times daily. 60 tablet 11  . predniSONE (DELTASONE) 5 MG tablet Take 5 mg by mouth daily with breakfast.    . rosuvastatin (CRESTOR) 5 MG tablet Take 5 mg by mouth every other day.      No current facility-administered medications for this visit.     Physical Exam: Vitals:   07/18/16 1412  BP: 120/80  Pulse: 86  Weight: 182 lb (82.6 kg)  Height: 4\' 11"  (1.499 m)   GEN- The patient is overweight and chronically ill appearing, alert and oriented x 3 today.  Head- normocephalic, atraumatic  Eyes- Sclera clear, conjunctiva pink  Ears- hearing intact  Oropharynx- clear  Neck- supple,   Lungs- Clear to ausculation bilaterally, normal work of breathing  Heart- Regular rate and rhythm, no murmurs, rubs or gallops, PMI not laterally displaced  GI- soft, NT, ND, + BS  Extremities- no clubbing, cyanosis, or edema   EKG today reveals sinus rhythm, nonspecific ST/T changes  Assessment and Plan:  1. Atrial fibrillation  Doing well off of AAD therapy. She had tremor previously with amiodarone.  If her afib recurs then she would prefer to restart sotalol.  Tikosyn would also be an option.  She is currently off of anticoagulation due to prior falls and is felt to be a poor candidate for coumadin going forward.    2. CAD Continue current medical therapy  No ischemic symptoms Recent lipids from PCP are reviewed Though she may benefit from additional crestor, I am very reluctant to make changes today  3. Morbid obesity Weight reduction is encouraged today.   Body mass index is 36.76 kg/m.  4. Chronic diastolic dysfunction Stable No change required today   She will return to see Chanetta Marshall every 6 months I am happy to see when needed  [name] MD,  [facility] 07/18/2016 2:21 PM

## 2016-08-09 ENCOUNTER — Encounter: Payer: Self-pay | Admitting: Gastroenterology

## 2016-08-09 ENCOUNTER — Ambulatory Visit: Payer: Medicare Other | Admitting: Pulmonary Disease

## 2016-08-23 ENCOUNTER — Ambulatory Visit: Payer: Medicare Other | Admitting: Pulmonary Disease

## 2016-09-08 ENCOUNTER — Ambulatory Visit (INDEPENDENT_AMBULATORY_CARE_PROVIDER_SITE_OTHER): Payer: Medicare Other | Admitting: Pulmonary Disease

## 2016-09-08 ENCOUNTER — Encounter: Payer: Self-pay | Admitting: Pulmonary Disease

## 2016-09-08 VITALS — BP 136/76 | HR 88 | Ht 59.0 in | Wt 187.8 lb

## 2016-09-08 DIAGNOSIS — R05 Cough: Secondary | ICD-10-CM

## 2016-09-08 DIAGNOSIS — R059 Cough, unspecified: Secondary | ICD-10-CM

## 2016-09-08 NOTE — Progress Notes (Signed)
[name]    [id number]    05/02/1946  Primary Care Physician:MACKENZIE,BRIAN, MD  Referring Physician: [name], MD [address], [place], [place] [address]  Chief complaint:   Follow up for Dermatomyositis Mod Pulm HTN OSA. Non compliant  HPI: [name] is a 71 year old with a complex past medical history which includes dermatomyositis, moderate pulmonary hypertension, OSA [noncompliant with CPAP], atrial fibrillation.  She complains of chronic nonproductive cough. Problem has been going on for several years. This is occasionally associated with dyspnea and wheezing. She says that exposure to strong perfumes and cold air, heat, humidity makes his symptoms worse. She has spring and fall allergies. At present she denies any rhinitis, postnasal drip, sinus pain or pressure. She has history of GERD and is on omeprazole for that. She has history of dermatomyositis and is maintained on methotrexate, Plaquenil and low-dose prednisone (5 mg). She has history of hiatal hernia, esophageal dysmotility, dysphagia. She has episodes of choking on food.  She denies any sputum production, hemoptysis. She is admitted in March 2017 with lower extremity cellulitis. She had a CT scan at that time that showed small left lower lobe pneumonia. She also had a right subpleural pulmonary nodule that needs to be followed up. She is doing well post discharge.  She was diagnosed with OSA many years ago. She uses the CPAP for a while but then stopped using it since it got very uncomfortable and she felt that the CPAP is blowing air into her hiatal hernia and stomach. She denies any snoring or daytime somnolence.  Interim History: She is improved from respiratory standpoint. She is no longer using her walker and is able to walk around with a cane. She denies any dyspnea, lower extremity edema, exercise limitation. She still has occasional cough and her acid reflux symptoms have  also improved.  Outpatient Encounter Prescriptions as of 09/08/2016  Medication Sig  . acetaminophen (TYLENOL) 500 MG tablet Take 500-1,000 mg by mouth every 6 (six) hours as needed for mild pain or fever.   Marland Kitchen acidophilus (RISAQUAD) CAPS capsule Take 2 capsules by mouth daily.  Marland Kitchen alendronate (FOSAMAX) 70 MG tablet Take 1 tablet by mouth every Tuesday.   Marland Kitchen allopurinol (ZYLOPRIM) 100 MG tablet Take 100 mg by mouth daily.  Marland Kitchen aspirin EC 325 MG tablet Take 325 mg by mouth daily.  . cetirizine (ZYRTEC) 10 MG tablet Take 10 mg by mouth daily.  . Cholecalciferol (VITAMIN D3) 2000 UNITS TABS Take 2,000 Units by mouth daily.   . fluticasone (FLONASE) 50 MCG/ACT nasal spray Place 1 spray into both nostrils as directed.  Marland Kitchen FOLIC ACID PO Take 1 tablet by mouth 4 (four) times daily.   . furosemide (LASIX) 40 MG tablet Take 1 tablet (40 mg total) by mouth 2 (two) times daily.  . hydroxychloroquine (PLAQUENIL) 200 MG tablet Take 200 mg by mouth 2 (two) times daily.   Marland Kitchen levothyroxine (SYNTHROID, LEVOTHROID) 137 MCG tablet Take 1 tablet by mouth daily before breakfast.  . methotrexate (RHEUMATREX) 2.5 MG tablet Take 5 tablets by mouth every Wednesday.   . metoprolol succinate (TOPROL-XL) 25 MG 24 hr tablet Take 1 tablet by mouth  daily  . Multiple Vitamin (MULTIVITAMIN) tablet Take 1 tablet by mouth daily.    Marland Kitchen nystatin (MYCOSTATIN/NYSTOP) 100000 UNIT/GM POWD Apply 1 g topically 2 (two) times daily. Applies to stomach  . OVER THE COUNTER MEDICATION Place 1 drop into both eyes daily  as needed (for dry eyes. Advanced eye relief).  . pantoprazole (PROTONIX) 40 MG tablet TAKE 1 TABLET BY MOUTH 2  TIMES DAILY BEFORE MEAL  . potassium chloride SA (KLOR-CON M20) 20 MEQ tablet Take 1 tablet (20 mEq total) by mouth 3 (three) times daily.  . predniSONE (DELTASONE) 5 MG tablet Take 5 mg by mouth daily with breakfast.  . rosuvastatin (CRESTOR) 5 MG tablet Take 5 mg by mouth every other day.   . [DISCONTINUED]  nitroGLYCERIN (NITROSTAT) 0.4 MG SL tablet Place 0.4 mg under the tongue as directed.   No facility-administered encounter medications on file as of 09/08/2016.     Allergies as of 09/08/2016 - Review Complete 09/08/2016  Allergen Reaction Noted  . Butoconazole Itching, Rash, and Other (See Comments) 09/23/2010  . Lipitor [atorvastatin calcium] Other (See Comments) 08/17/2011  . Keflex [cephalexin] Diarrhea 03/13/2015    Past Medical History:  Diagnosis Date  . Allergy   . Anemia   . Bilateral leg edema   . CAD (coronary artery disease)    a. LHC 03/2006: EF 65%, mid LAD 40-50%, ostial D1 70-80%, normal circumflex, normal RCA;  b. Lexiscan Myoview 09/2010: No ischemia or scar, EF 75%;  c. Lex MV 5/14:  Normal, no ischemia, EF 71%  . Cataract    bilateral-had surgery  . Cellulitis of right leg    Hx - resolved  . Chronic diastolic CHF (congestive heart failure) ([place])    a. Echo 8/14:  Mild LVH, EF 55-65%, normal wall motion, Tr AI, mild MR, mod TR, PASP 45;  b. 12/2014 Echo: EF 60-65%, nl wall motion, mildly dil RA/LA, PASP 49mmHg.  Marland Kitchen Chronic diastolic heart failure ([place])   . Continuous urine leakage   . Dermatomyositis ([place])   . Deviated septum   . Essential hypertension   . Gall stones    a. 08/2014 Abd U/S:  Large gallstone measuring 4.2 cm.  Marland Kitchen GERD (gastroesophageal reflux disease)   . Gout   . Hiatal hernia   . History of pancreatitis   . History of PFTs    a. PFTs 10/2012: normal.   . Hyperlipidemia   . Hypothyroidism   . Internal hemorrhoids   . Morbid obesity ([place])   . Osteoarthritis    spine hips knees  . PAF (paroxysmal atrial fibrillation) ([facility])    a. CHA2DS2VASc = 5-->poor OAC candidate 2/2 falls. No problems now per patient 03/2016  . Shingles    Hx  . Sleep apnea    Does not use CPAP  . Spinal cord compression ([place]) 02/21/2014    Past Surgical History:  Procedure Laterality Date  . BACK SURGERY  Sept 7 and May 30, 2014   T11/T12  . cataract surgery  Bilateral   . COLONOSCOPY  03/2011  . NASAL SEPTUM SURGERY    . TOTAL ABDOMINAL HYSTERECTOMY    . WISDOM TOOTH EXTRACTION      Family History  Problem Relation Age of Onset  . Coronary artery disease Father   . Heart attack Brother     x 2  . Colon cancer Paternal Uncle   . Kidney disease Brother   . Hypertension Brother   . Stroke Neg Hx   . Esophageal cancer Neg Hx     Social History   Social History  . Marital status: Married    Spouse name: N/A  . Number of children: 2  . Years of education: N/A   Occupational History  .  Unemployed  Social History Main Topics  . Smoking status: Never Smoker  . Smokeless tobacco: Never Used  . Alcohol use No  . Drug use: No  . Sexual activity: No   Other Topics Concern  . Not on file   Social History Narrative  . No narrative on file    Review of systems: Review of Systems  Constitutional: Negative for fever and chills.  HENT: Negative.   Eyes: Negative for blurred vision.  Respiratory: as per HPI  Cardiovascular: Negative for chest pain and palpitations.  Gastrointestinal: Negative for vomiting, diarrhea, blood per rectum. Genitourinary: Negative for dysuria, urgency, frequency and hematuria.  Musculoskeletal: Negative for myalgias, back pain and joint pain.  Skin: Negative for itching and rash.  Neurological: Negative for dizziness, tremors, focal weakness, seizures and loss of consciousness.  Endo/Heme/Allergies: Negative for environmental allergies.  Psychiatric/Behavioral: Negative for depression, suicidal ideas and hallucinations.  All other systems reviewed and are negative.  Physical Exam: Blood pressure 136/76, pulse 88, height 4\' 11"  (1.499 m), weight 187 lb 12.8 oz (85.2 kg), SpO2 97 %. Gen:      No acute distress HEENT:  EOMI, sclera anicteric Neck:     No masses; no thyromegaly Lungs:    Clear to auscultation bilaterally; normal respiratory effort CV:         Regular rate and rhythm; no murmurs Abd:       + bowel sounds; soft, non-tender; no palpable masses, no distension Ext:    No edema; adequate peripheral perfusion Skin:      Warm and dry; no rash Neuro: alert and oriented x 3 Psych: normal mood and affect  Data Reviewed: PFTs (10/26/12) FVC 2.27 (89%) FEV1 1.89 (98%) TLC 86% DLCO 97% No bronchodilator response. Normal spirometry, normal lung volumes, normal diffusion.  PFTs [07/01/15] FVC 1.41 (57%) FEV1 1.14 (61%) F/F 81 TLC 60% DLCO 60% Severe restriction, moderate reduction in diffusion capacity, no bronchodilator response  Echo (12/19/14) - Left ventricle: The cavity size was normal. Wall thickness was normal. Systolic function was normal. The estimated ejection fraction was in the range of 60% to 65%. Wall motion was normal; there were no regional wall motion abnormalities. - Left atrium: The atrium was mildly dilated. - Right atrium: The atrium was mildly dilated. - Pulmonary arteries: Systolic pressure was moderately increased. PA peak pressure: 42 mm Hg (S).  Imaging CT scan 07/07/15 Images reviewed. No interstitial lung disease. Bronchiectasis, bronchial wall thickening  CT scan 08/13/15 New left lower lobe mild infiltrate. Stable subpleural nodules  CT abdomen 05/25/16  Linear subsegmental atelectasis in the left lower lobe. No pulmonary nodules identified in lung images. I reviewed all images personally  Assessment:  #1 Chronic cough. Cough is likely due to dysphagia, esophageal dysmotility, related to dermatomyositis. She does have GERD, seasonal allergies and rhinitis that may be contributing to the problem. Symptoms are stable on Protonix. She can probably stop taking the Protonix.  I do not suspect reactive airway disease/asthma. She was given albuterol PRN and Flovent last year but she has not used them as they were too expensive through her insurance. Besides she did not tolerate the albuterol because it make her jittery.  #2  Dermatomyositis. She's is on methotrexate and Plaquenil, chronic prednisone. Her PFTs show a new reduction in lung volumes and diffusion capacity however the CT scan does not show any ILD. I suspect that the PFT changes are due to her obesity and body habitus.   #3 Moderate pulmonary hypertension. Likely related to  her dermatomyositis, untreated OSA. Her PA pressures have remained stable. We'll continue to follow clincally. Get repeat echo if there is worsening resp symptoms  #4 OSA This has been untreated for several years. She is intolerant of CPAP. She feels that her snoring has improved after she lost weight and does not want to use the CPAP again  Plan/Recommendations: - Continue protonix - Continue methorexate, plaquinel, prednisone  Follow up in 1 year  Marshell Garfinkel MD [facility] Pager [phone] 09/08/2016, 10:48 AM  CC: [name], MD

## 2016-09-08 NOTE — Patient Instructions (Signed)
Follow-up in one year unless there is any change in your respiratory symptoms

## 2016-09-21 DIAGNOSIS — H04123 Dry eye syndrome of bilateral lacrimal glands: Secondary | ICD-10-CM | POA: Diagnosis not present

## 2016-09-21 DIAGNOSIS — H53002 Unspecified amblyopia, left eye: Secondary | ICD-10-CM | POA: Diagnosis not present

## 2016-09-21 DIAGNOSIS — Z79899 Other long term (current) drug therapy: Secondary | ICD-10-CM | POA: Diagnosis not present

## 2016-09-22 DIAGNOSIS — Z1231 Encounter for screening mammogram for malignant neoplasm of breast: Secondary | ICD-10-CM | POA: Diagnosis not present

## 2016-09-26 ENCOUNTER — Encounter: Payer: Self-pay | Admitting: Gastroenterology

## 2016-09-26 ENCOUNTER — Ambulatory Visit (INDEPENDENT_AMBULATORY_CARE_PROVIDER_SITE_OTHER): Payer: Medicare Other | Admitting: Gastroenterology

## 2016-09-26 VITALS — BP 110/68 | HR 92 | Ht <= 58 in | Wt 187.5 lb

## 2016-09-26 DIAGNOSIS — K219 Gastro-esophageal reflux disease without esophagitis: Secondary | ICD-10-CM | POA: Diagnosis not present

## 2016-09-26 DIAGNOSIS — A0472 Enterocolitis due to Clostridium difficile, not specified as recurrent: Secondary | ICD-10-CM

## 2016-09-26 DIAGNOSIS — R151 Fecal smearing: Secondary | ICD-10-CM

## 2016-09-26 DIAGNOSIS — R197 Diarrhea, unspecified: Secondary | ICD-10-CM | POA: Diagnosis not present

## 2016-09-26 DIAGNOSIS — K8020 Calculus of gallbladder without cholecystitis without obstruction: Secondary | ICD-10-CM

## 2016-09-26 DIAGNOSIS — R933 Abnormal findings on diagnostic imaging of other parts of digestive tract: Secondary | ICD-10-CM | POA: Diagnosis not present

## 2016-09-26 NOTE — Patient Instructions (Signed)
Start a probiotic Florastor twice daily x 1 month.   Thank you for choosing me and [facility].  Pricilla Riffle. Dagoberto Ligas., MD., [facility] Regal

## 2016-09-26 NOTE — Progress Notes (Signed)
History of Present Illness: This is a 71 year old female referred by [name], MD for the evaluation of diarrhea and abdominal pain. She has multiple comorbid conditions. She was treated for C. difficile twice over the winter and had resolution of her symptoms after the last course of treatment. She feels her bowel habits are back to normal although she notes more mucus in her stools than she had previously and occasionally has noted mild incontinence with mucous. She has had long term problems with alternating diarrhea and constipation and these symptoms have not changed. She relates chronic right-sided abdominal pain increases mildly with meals but is present almost all the time. She has reflux that is well controlled on pantoprazole. CT scan and ultrasound performed with results below. She relates increased back pain over the past few days. Denies weight loss, change in stool caliber, melena, hematochezia, nausea, vomiting, dysphagia, chest pain.  Colonoscopy 03/2011: normal  Abd/pelvic CT 05/2016 IMPRESSION: Questionable mild diffuse wall thickening of the colon with scattered slight hyperemia of the mesocolon raising question of a subtle diffuse colitis; this could be seen from infection or inflammatory bowel disease.  Cholelithiasis.  Aortic atherosclerosis.   Allergies  Allergen Reactions  . Butoconazole Itching, Rash and Other (See Comments)    'Redness and swelling feverish ' 'worse symptoms '  . Lipitor [Atorvastatin Calcium] Other (See Comments)    'Muscle weakness'    . Keflex [Cephalexin] Diarrhea   Outpatient Medications Prior to Visit  Medication Sig Dispense Refill  . acetaminophen (TYLENOL) 500 MG tablet Take 500-1,000 mg by mouth every 6 (six) hours as needed for mild pain or fever.     Marland Kitchen acidophilus (RISAQUAD) CAPS capsule Take 2 capsules by mouth daily. 30 capsule 0  . alendronate (FOSAMAX) 70 MG tablet Take 1 tablet by mouth every Tuesday.     Marland Kitchen  allopurinol (ZYLOPRIM) 100 MG tablet Take 100 mg by mouth daily.    Marland Kitchen aspirin EC 325 MG tablet Take 325 mg by mouth daily.    . cetirizine (ZYRTEC) 10 MG tablet Take 10 mg by mouth daily.    . Cholecalciferol (VITAMIN D3) 2000 UNITS TABS Take 2,000 Units by mouth daily.     Marland Kitchen FOLIC ACID PO Take 1 tablet by mouth 4 (four) times daily.     . furosemide (LASIX) 40 MG tablet Take 1 tablet (40 mg total) by mouth 2 (two) times daily. 120 tablet 5  . hydroxychloroquine (PLAQUENIL) 200 MG tablet Take 200 mg by mouth 2 (two) times daily.     Marland Kitchen levothyroxine (SYNTHROID, LEVOTHROID) 137 MCG tablet Take 1 tablet by mouth daily before breakfast.  5  . methotrexate (RHEUMATREX) 2.5 MG tablet Take 5 tablets by mouth every Wednesday.   3  . metoprolol succinate (TOPROL-XL) 25 MG 24 hr tablet Take 1 tablet by mouth  daily 90 tablet 3  . Multiple Vitamin (MULTIVITAMIN) tablet Take 1 tablet by mouth daily.      Marland Kitchen nystatin (MYCOSTATIN/NYSTOP) 100000 UNIT/GM POWD Apply 1 g topically 2 (two) times daily. Applies to stomach    . OVER THE COUNTER MEDICATION Place 1 drop into both eyes daily as needed (for dry eyes. Advanced eye relief).    . potassium chloride SA (KLOR-CON M20) 20 MEQ tablet Take 1 tablet (20 mEq total) by mouth 3 (three) times daily. 60 tablet 11  . predniSONE (DELTASONE) 5 MG tablet Take 5 mg by mouth daily with breakfast.    . rosuvastatin (CRESTOR)  5 MG tablet Take 5 mg by mouth every other day.     . fluticasone (FLONASE) 50 MCG/ACT nasal spray Place 1 spray into both nostrils as directed.    . pantoprazole (PROTONIX) 40 MG tablet TAKE 1 TABLET BY MOUTH 2  TIMES DAILY BEFORE MEAL 180 tablet 3   No facility-administered medications prior to visit.    Past Medical History:  Diagnosis Date  . Allergy   . Anemia   . Bilateral leg edema   . CAD (coronary artery disease)    a. LHC 03/2006: EF 65%, mid LAD 40-50%, ostial D1 70-80%, normal circumflex, normal RCA;  b. Lexiscan Myoview 09/2010: No  ischemia or scar, EF 75%;  c. Lex MV 5/14:  Normal, no ischemia, EF 71%  . Cataract    bilateral-had surgery  . Cellulitis of right leg    Hx - resolved  . Chronic diastolic CHF (congestive heart failure) ([place])    a. Echo 8/14:  Mild LVH, EF 55-65%, normal wall motion, Tr AI, mild MR, mod TR, PASP 45;  b. 12/2014 Echo: EF 60-65%, nl wall motion, mildly dil RA/LA, PASP 75mmHg.  Marland Kitchen Chronic diastolic heart failure ([place])   . Continuous urine leakage   . Dermatomyositis ([place])   . Deviated septum   . Essential hypertension   . Gall stones    a. 08/2014 Abd U/S:  Large gallstone measuring 4.2 cm.  Marland Kitchen GERD (gastroesophageal reflux disease)   . Gout   . Hiatal hernia   . History of pancreatitis   . History of PFTs    a. PFTs 10/2012: normal.   . Hyperlipidemia   . Hypothyroidism   . Internal hemorrhoids   . Morbid obesity ([place])   . Osteoarthritis    spine hips knees  . PAF (paroxysmal atrial fibrillation) ([facility])    a. CHA2DS2VASc = 5-->poor OAC candidate 2/2 falls. No problems now per patient 03/2016  . Pancreatitis   . Pneumonia   . Seizures ([place])    as a baby  . Shingles    Hx  . Sleep apnea    Does not use CPAP  . Spinal cord compression ([place]) 02/21/2014   Past Surgical History:  Procedure Laterality Date  . CATARACT EXTRACTION, BILATERAL Bilateral   . COLONOSCOPY  03/2011  . NASAL SEPTUM SURGERY    . TOTAL ABDOMINAL HYSTERECTOMY    . VERTEBROPLASTY  Sept 7 and May 30, 2014   x 2, T11/T12  . WISDOM TOOTH EXTRACTION     Social History   Social History  . Marital status: Married    Spouse name: N/A  . Number of children: 2  . Years of education: N/A   Occupational History  .  Unemployed   Social History Main Topics  . Smoking status: Never Smoker  . Smokeless tobacco: Never Used  . Alcohol use No  . Drug use: No  . Sexual activity: No   Other Topics Concern  . None   Social History Narrative  . None   Family History  Problem Relation Age of Onset  . Colon  cancer Mother     mets, spot on lungs and spine  . Coronary artery disease Father   . Emphysema Father   . Heart attack Brother     x 2  . Colon cancer Paternal Uncle   . Kidney disease Brother   . Hypertension Brother   . Stroke Neg Hx   . Esophageal cancer Neg Hx  Review of Systems: Pertinent positive and negative review of systems were noted in the above HPI section. All other review of systems were otherwise negative.   Physical Exam: General: Well developed, well nourished, chronically ill appearing, obese, no acute distress Head: Normocephalic and atraumatic Eyes:  sclerae anicteric, EOMI Ears: Normal auditory acuity Mouth: No deformity or lesions Neck: Supple, no masses or thyromegaly Lungs: Clear throughout to auscultation Heart: Regular rate and rhythm; no murmurs, rubs or bruits Abdomen: Soft, mild right sided tenderness and non distended. No masses, hepatosplenomegaly or hernias noted. Normal Bowel sounds Musculoskeletal: Symmetrical with no gross deformities  Skin: No lesions on visible extremities Pulses:  Normal pulses noted Extremities: No clubbing, cyanosis, edema or deformities noted Neurological: Alert oriented x 4, grossly nonfocal Cervical Nodes:  No significant cervical adenopathy Inguinal Nodes: No significant inguinal adenopathy Psychological:  Alert and cooperative. Normal mood and affect  Assessment and Recommendations:  1. C diff colitis, resolved. Abnormal colonic wall thickening noted on CT was very likely due to C. difficile colitis that has now resolved. Her colonoscopy in 2012 was normal. Increased mucus in stool and incontinence with mucus are likely post infectious related. Florastor twice daily for 1 month and call back if symptoms have not resolved. If diarrhea recurs or other unexplained bowel changes occur consider repeat colonoscopy although she is at higher risk for colonoscopy and sedation due to her comorbidities.  2. Chronic  right-sided abdominal pain. Known cholelithiasis although with ongoing pain and tenderness, often unrelated to meals, symptoms are not classic for symptomatic cholelithiasis. Possible referred pain, radicular pain from back. Consider trial of antispasmodics if symptoms do not improve.  3. GERD, well controlled. Continue pantoprazole 40 mg twice daily and standard antireflux measures.  4. Increased back pain with history of osteoporosis, vertebral compression fractures and prior vertebroplasty. R/O a new compression fracture. Back pain could be referred to the right side of the abdomen leading to pain in #2. Advised to contact her PCP today for further evaluation of her back symptoms.   cc: [name], MD [address], [place], [place] [address]

## 2016-10-03 DIAGNOSIS — Z79899 Other long term (current) drug therapy: Secondary | ICD-10-CM | POA: Diagnosis not present

## 2016-10-03 DIAGNOSIS — M1A09X0 Idiopathic chronic gout, multiple sites, without tophus (tophi): Secondary | ICD-10-CM | POA: Diagnosis not present

## 2016-10-05 ENCOUNTER — Telehealth: Payer: Self-pay | Admitting: Gastroenterology

## 2016-10-05 NOTE — Telephone Encounter (Signed)
Stop Florastor to be sure it is not causing diarrhea If diarrhea persist send GI pathogen panel

## 2016-10-05 NOTE — Telephone Encounter (Signed)
Patient with a history of C-diff in Feb and recent OV where it was recommended to take florastor. Patient reports that she had an increase in diarrhea for the last few days.  She was up approximately 5 times during the night and has had another 5 watery BMs today.  She has not taken the florastor since Monday because she thought it was worsening her diarrhea. Dr. Fuller Plan your  last note indicated possible colonoscopy if symptoms worsen.  Please advise

## 2016-10-05 NOTE — Telephone Encounter (Signed)
Patient notified She will call with an update tomorrow if she is still having diarrhea.

## 2016-10-06 ENCOUNTER — Telehealth: Payer: Self-pay | Admitting: Gastroenterology

## 2016-10-06 NOTE — Telephone Encounter (Signed)
See phone note from 10/06/16 for additional details.

## 2016-10-10 ENCOUNTER — Other Ambulatory Visit: Payer: Medicare Other

## 2016-10-10 ENCOUNTER — Telehealth: Payer: Self-pay | Admitting: Gastroenterology

## 2016-10-10 DIAGNOSIS — R197 Diarrhea, unspecified: Secondary | ICD-10-CM

## 2016-10-10 NOTE — Telephone Encounter (Signed)
Patient still having diarrhea. See phone note from 10/05/16. She will come in and do stool studies. She will send her husband to pick up the containers

## 2016-10-13 ENCOUNTER — Telehealth: Payer: Self-pay | Admitting: Gastroenterology

## 2016-10-13 LAB — GASTROINTESTINAL PATHOGEN PANEL PCR
C. difficile Tox A/B, PCR: DETECTED — CR
Campylobacter, PCR: NOT DETECTED
Cryptosporidium, PCR: NOT DETECTED
E coli (ETEC) LT/ST PCR: NOT DETECTED
E coli (STEC) stx1/stx2, PCR: NOT DETECTED
E coli 0157, PCR: NOT DETECTED
Giardia lamblia, PCR: NOT DETECTED
Norovirus, PCR: NOT DETECTED
Rotavirus A, PCR: NOT DETECTED
Salmonella, PCR: NOT DETECTED
Shigella, PCR: NOT DETECTED

## 2016-10-13 NOTE — Telephone Encounter (Signed)
Lab called last night with positive results on C diff toxin.

## 2016-10-13 NOTE — Telephone Encounter (Addendum)
Patient notified panel is not back and that I will call once the results are back and Dr. Fuller Plan has reviewed.

## 2016-10-14 MED ORDER — SACCHAROMYCES BOULARDII 250 MG PO CAPS
250.0000 mg | ORAL_CAPSULE | Freq: Two times a day (BID) | ORAL | 1 refills | Status: DC
Start: 2016-10-14 — End: 2016-12-19

## 2016-10-14 MED ORDER — VANCOMYCIN HCL 125 MG PO CAPS: ORAL_CAPSULE | ORAL | 0 refills | Status: DC

## 2016-10-14 NOTE — Telephone Encounter (Signed)
Patient notified that the Vancomycin was covered they just don['t have it in stock.  They will order it and have it for her on Monday.  She is advised that she can transfer it to another pharmacy if she chooses. The florastor is not covered by her insurance and that she will need to purchase over the counter.

## 2016-10-14 NOTE — Telephone Encounter (Signed)
Vancomycin ordered

## 2016-10-14 NOTE — Telephone Encounter (Signed)
Patient called in stating that her pharmacy told her that this medication is not covered under her insurance. Best # [phone]

## 2016-10-14 NOTE — Telephone Encounter (Signed)
Notes recorded by [name], MD on 10/14/2016 at 9:57 AM EDT Notify pt. Vancomycin 125 mg po qid for 14d, then bid for 7d, then qd for 7d, then qod for 3 weeks, then every 3rd day for 3 weeks Florastor bid for 3 months Office appt in 2 months   Patient notified of all the above.  She is asked to make sure she cleans her bathroom surfaces with bleach, isolate her self to her own bathroom if possible for a few days, make sure she washes her hands after each trip to the bathroom.  Follow up scheduled for 12/19/16 10:45

## 2016-12-19 ENCOUNTER — Ambulatory Visit (INDEPENDENT_AMBULATORY_CARE_PROVIDER_SITE_OTHER): Payer: Medicare Other | Admitting: Gastroenterology

## 2016-12-19 ENCOUNTER — Encounter: Payer: Self-pay | Admitting: Gastroenterology

## 2016-12-19 VITALS — BP 126/80 | HR 80 | Ht <= 58 in | Wt 187.5 lb

## 2016-12-19 DIAGNOSIS — E785 Hyperlipidemia, unspecified: Secondary | ICD-10-CM | POA: Diagnosis not present

## 2016-12-19 DIAGNOSIS — M109 Gout, unspecified: Secondary | ICD-10-CM | POA: Diagnosis not present

## 2016-12-19 DIAGNOSIS — Z1212 Encounter for screening for malignant neoplasm of rectum: Secondary | ICD-10-CM

## 2016-12-19 DIAGNOSIS — Z1211 Encounter for screening for malignant neoplasm of colon: Secondary | ICD-10-CM

## 2016-12-19 DIAGNOSIS — E559 Vitamin D deficiency, unspecified: Secondary | ICD-10-CM | POA: Diagnosis not present

## 2016-12-19 DIAGNOSIS — E039 Hypothyroidism, unspecified: Secondary | ICD-10-CM | POA: Diagnosis not present

## 2016-12-19 DIAGNOSIS — A0472 Enterocolitis due to Clostridium difficile, not specified as recurrent: Secondary | ICD-10-CM

## 2016-12-19 DIAGNOSIS — I10 Essential (primary) hypertension: Secondary | ICD-10-CM | POA: Diagnosis not present

## 2016-12-19 MED ORDER — SACCHAROMYCES BOULARDII 250 MG PO CAPS: 250.0000 mg | ORAL_CAPSULE | Freq: Two times a day (BID) | ORAL | 0 refills | Status: DC

## 2016-12-19 NOTE — Progress Notes (Signed)
    History of Present Illness: This is a 71 year old female returning for follow-up of C. difficile colitis. She completed a course of vancomycin in her diarrhea resolved 1-2 weeks ago. She had a couple loose stools and intestinal gas over the weekend however other family members had similar symptoms and these symptoms have now resolved. She is taking Florastor as recommended. She has questions about timing of her screening colonoscopy.  Current Medications, Allergies, Past Medical History, Past Surgical History, Family History and Social History were reviewed in [employer] record.  Physical Exam: General: Well developed, well nourished, frail, chronically ill appearing, no acute distress Head: Normocephalic and atraumatic Eyes:  sclerae anicteric, EOMI Ears: Normal auditory acuity Mouth: No deformity or lesions Lungs: Clear throughout to auscultation Heart: Regular rate and rhythm; no murmurs, rubs or bruits Abdomen: Soft, non tender and non distended. No masses, hepatosplenomegaly or hernias noted. Normal Bowel sounds Musculoskeletal: Symmetrical with no gross deformities  Pulses:  Normal pulses noted Extremities: No clubbing, cyanosis, edema or deformities noted Neurological: Alert oriented x 4, grossly nonfocal Psychological:  Alert and cooperative. Normal mood and affect  Assessment and Recommendations:  1.  Family history of colon cancer in mother at 42 and personal history of dermatomyositis. Last colonoscopy in 2012 was normal. 10 year screening is appropriate however DM has a questionable long termer CRC risk and her mother had CRC however at age 33. A 10 year screening colonoscopy is reasonable. Offered her the option of a 5 year screening and she is comfortable with 10 years. Will need to reassess her health status in 2022 to determine if a screening colonoscopy is appropriate.   2. C. difficile colitis, resolved. Continue Florastor bid for 1 month. Call  if diarrhea returns.  3. Chronic right-sided lower chest/abdominal pain suspected musculoskeletal etiology.  I spent 15 minutes of face-to-face time with the patient. Greater than 50% of the time was spent counseling and coordinating care.

## 2016-12-19 NOTE — Patient Instructions (Signed)
You will be due for a recall colonoscopy in 03/2021. We will send you a reminder in the mail when it gets closer to that time.  We have sent the following medications to your pharmacy for you to pick up at your convenience: Florastor to take one tablet by mouth twice daily x 1 month.  Thank you for choosing me and [facility].  Pricilla Riffle. Dagoberto Ligas., MD., [facility] Regal

## 2016-12-26 DIAGNOSIS — M549 Dorsalgia, unspecified: Secondary | ICD-10-CM | POA: Diagnosis not present

## 2016-12-26 DIAGNOSIS — E039 Hypothyroidism, unspecified: Secondary | ICD-10-CM | POA: Diagnosis not present

## 2016-12-26 DIAGNOSIS — I5032 Chronic diastolic (congestive) heart failure: Secondary | ICD-10-CM | POA: Diagnosis not present

## 2016-12-26 DIAGNOSIS — I4891 Unspecified atrial fibrillation: Secondary | ICD-10-CM | POA: Diagnosis not present

## 2016-12-26 DIAGNOSIS — M546 Pain in thoracic spine: Secondary | ICD-10-CM | POA: Diagnosis not present

## 2016-12-26 DIAGNOSIS — D696 Thrombocytopenia, unspecified: Secondary | ICD-10-CM | POA: Diagnosis not present

## 2016-12-29 ENCOUNTER — Telehealth: Payer: Self-pay | Admitting: Gastroenterology

## 2016-12-29 MED ORDER — VANCOMYCIN HCL 125 MG PO CAPS: 125.0000 mg | ORAL_CAPSULE | Freq: Four times a day (QID) | ORAL | 0 refills | Status: DC

## 2016-12-29 NOTE — Telephone Encounter (Signed)
Patient reports that she is having diarrhea 3 episodes this am "like chocolate syrup".  She is also having rectal spasms.  I explained that she needs to remain on her probiotic.  She has recently been treated for c-diff with vancomycin.  Please advise.

## 2016-12-29 NOTE — Telephone Encounter (Signed)
Vancomycin 125 mg po qid for 14d, then 125 mg po bid for 7d, then 125 mg po qd for 7d, then 125 mg po qod for 8 days, then 125 mg po every 3rd day for 2 weeks  Florastor bid for 2 months  Bland, lactose free, low fiber, low fat diet until symptoms improved

## 2016-12-29 NOTE — Telephone Encounter (Signed)
Patient notified rx sent 

## 2017-01-09 DIAGNOSIS — M150 Primary generalized (osteo)arthritis: Secondary | ICD-10-CM | POA: Diagnosis not present

## 2017-01-09 DIAGNOSIS — Z79899 Other long term (current) drug therapy: Secondary | ICD-10-CM | POA: Diagnosis not present

## 2017-01-09 DIAGNOSIS — M3312 Other dermatopolymyositis with myopathy: Secondary | ICD-10-CM | POA: Diagnosis not present

## 2017-01-09 DIAGNOSIS — M1A09X0 Idiopathic chronic gout, multiple sites, without tophus (tophi): Secondary | ICD-10-CM | POA: Diagnosis not present

## 2017-01-13 ENCOUNTER — Telehealth: Payer: Self-pay | Admitting: Gastroenterology

## 2017-01-13 ENCOUNTER — Other Ambulatory Visit: Payer: Self-pay

## 2017-01-13 NOTE — Telephone Encounter (Signed)
When I went to put this into system to send to pharmacy, it came back with a high alert for allergy. Any other suggestion? Thank you.

## 2017-01-13 NOTE — Telephone Encounter (Signed)
Let patient know that Dr. Fuller Plan would like to have her contact her PCP or GYN office to see if they have other suggestions for her yeast infection. Allergy to diflucan.

## 2017-01-13 NOTE — Telephone Encounter (Signed)
She needs to contact her PCP or GYN for advice.

## 2017-01-13 NOTE — Telephone Encounter (Signed)
Patient is on second round of vancomycin for C-diff, she has had vaginal yeast infections in the past, she started having vaginal burning yesterday. Denies any discharge. States she is not having any urinary burning. Please advise.  Also wanted to let you know that she is much better from her C-diff symptoms.

## 2017-01-13 NOTE — Telephone Encounter (Signed)
Diflucan 100 mg po qd x 5 days

## 2017-01-18 ENCOUNTER — Other Ambulatory Visit: Payer: Self-pay | Admitting: Internal Medicine

## 2017-01-23 ENCOUNTER — Encounter: Payer: Self-pay | Admitting: *Deleted

## 2017-02-08 NOTE — Progress Notes (Signed)
Electrophysiology Office Note Date: 02/09/2017  ID:  [name], [name] 03-14-1946, MRN [id number]  PCP: [name], MD Electrophysiologist: Rayann Heman  CC: AF follow up  [name] is a 71 y.o. female seen today for Dr Rayann Heman.  She presents today for routine electrophysiology followup.  Since last being seen in our clinic, the patient reports doing relatively well.  She is now ambulating with a walker. She has not had recurrent falls. She has undergone treatment for C.Diff.  She has had no significant AF.  She denies chest pain, PND, orthopnea, nausea, vomiting, dizziness, syncope, edema, weight gain, or early satiety.  Past Medical History:  Diagnosis Date  . Allergy   . Anemia   . Bilateral leg edema   . CAD (coronary artery disease)    a. LHC 03/2006: EF 65%, mid LAD 40-50%, ostial D1 70-80%, normal circumflex, normal RCA;  b. Lexiscan Myoview 09/2010: No ischemia or scar, EF 75%;  c. Lex MV 5/14:  Normal, no ischemia, EF 71%  . Cataract    bilateral-had surgery  . Cellulitis of right leg    Hx - resolved  . Chronic diastolic CHF (congestive heart failure) ([place])    a. Echo 8/14:  Mild LVH, EF 55-65%, normal wall motion, Tr AI, mild MR, mod TR, PASP 45;  b. 12/2014 Echo: EF 60-65%, nl wall motion, mildly dil RA/LA, PASP 32mmHg.  Marland Kitchen Chronic diastolic heart failure ([place])   . Continuous urine leakage   . Dermatomyositis ([place])   . Deviated septum   . Essential hypertension   . Gall stones    a. 08/2014 Abd U/S:  Large gallstone measuring 4.2 cm.  Marland Kitchen GERD (gastroesophageal reflux disease)   . Gout   . Hiatal hernia   . History of pancreatitis   . History of PFTs    a. PFTs 10/2012: normal.   . Hyperlipidemia   . Hypothyroidism   . Internal hemorrhoids   . Morbid obesity ([place])   . Osteoarthritis    spine hips knees  . PAF (paroxysmal atrial fibrillation) ([facility])    a. CHA2DS2VASc = 5-->poor OAC candidate 2/2 falls. No problems now per patient 03/2016  . Pancreatitis   .  Pneumonia   . Seizures ([place])    as a baby  . Shingles    Hx  . Sleep apnea    Does not use CPAP  . Spinal cord compression ([place]) 02/21/2014   Past Surgical History:  Procedure Laterality Date  . CATARACT EXTRACTION, BILATERAL Bilateral   . COLONOSCOPY  03/2011  . NASAL SEPTUM SURGERY    . TOTAL ABDOMINAL HYSTERECTOMY    . VERTEBROPLASTY  Sept 7 and May 30, 2014   x 2, T11/T12  . WISDOM TOOTH EXTRACTION      Current Outpatient Prescriptions  Medication Sig Dispense Refill  . acetaminophen (TYLENOL) 500 MG tablet Take 500-1,000 mg by mouth every 6 (six) hours as needed for mild pain or fever.     Marland Kitchen alendronate (FOSAMAX) 70 MG tablet Take 1 tablet by mouth every Tuesday.     Marland Kitchen allopurinol (ZYLOPRIM) 100 MG tablet Take 100 mg by mouth 2 (two) times daily.     Marland Kitchen aspirin EC 325 MG tablet Take 325 mg by mouth daily.    . cetirizine (ZYRTEC) 10 MG tablet Take 10 mg by mouth daily.    . Cholecalciferol (VITAMIN D3) 2000 UNITS TABS Take 2,000 Units by mouth daily.     Marland Kitchen FOLIC ACID  PO Take 1 tablet by mouth 4 (four) times daily.     . furosemide (LASIX) 40 MG tablet Take 1 tablet (40 mg total) by mouth 2 (two) times daily. 120 tablet 5  . hydroxychloroquine (PLAQUENIL) 200 MG tablet Take 200 mg by mouth 2 (two) times daily.     Marland Kitchen levothyroxine (SYNTHROID, LEVOTHROID) 137 MCG tablet Take 1 tablet by mouth daily before breakfast.  5  . methotrexate (RHEUMATREX) 2.5 MG tablet Take 5 tablets by mouth every Wednesday.   3  . metoprolol succinate (TOPROL-XL) 25 MG 24 hr tablet TAKE 1 TABLET BY MOUTH  DAILY 90 tablet 1  . Multiple Vitamin (MULTIVITAMIN) tablet Take 1 tablet by mouth daily.      Marland Kitchen nystatin (MYCOSTATIN/NYSTOP) 100000 UNIT/GM POWD Apply 1 g topically 2 (two) times daily. Applies to stomach    . OVER THE COUNTER MEDICATION Place 1 drop into both eyes daily as needed (for dry eyes. Advanced eye relief).    . potassium chloride SA (KLOR-CON M20) 20 MEQ tablet Take 1 tablet (20 mEq total)  by mouth 3 (three) times daily. 60 tablet 11  . predniSONE (DELTASONE) 5 MG tablet Take 5 mg by mouth daily with breakfast.    . rosuvastatin (CRESTOR) 5 MG tablet Take 5 mg by mouth every other day.     . saccharomyces boulardii (FLORASTOR) 250 MG capsule Take 1 capsule (250 mg total) by mouth 2 (two) times daily. 60 capsule 0   No current facility-administered medications for this visit.     Allergies:   Butoconazole; Lipitor [atorvastatin calcium]; and Keflex [cephalexin]   Social History: Social History   Social History  . Marital status: Married    Spouse name: N/A  . Number of children: 2  . Years of education: N/A   Occupational History  .  Unemployed   Social History Main Topics  . Smoking status: Never Smoker  . Smokeless tobacco: Never Used  . Alcohol use No  . Drug use: No  . Sexual activity: No   Other Topics Concern  . Not on file   Social History Narrative  . No narrative on file    Family History: Family History  Problem Relation Age of Onset  . Colon cancer Mother        mets, spot on lungs and spine  . Coronary artery disease Father   . Emphysema Father   . Heart attack Brother        x 2  . Colon cancer Paternal Uncle   . Kidney disease Brother   . Hypertension Brother   . Stroke Neg Hx   . Esophageal cancer Neg Hx     Review of Systems: All other systems reviewed and are otherwise negative except as noted above.   Physical Exam: VS:  BP 116/64   Pulse 85   Ht 4\' 9"  (1.448 m)   Wt 180 lb (81.6 kg)   SpO2 98%   BMI 38.95 kg/m  , BMI Body mass index is 38.95 kg/m. Wt Readings from Last 3 Encounters:  02/09/17 180 lb (81.6 kg)  12/19/16 187 lb 8 oz (85 kg)  09/26/16 187 lb 8 oz (85 kg)    GEN- The patient is elderly and morbidly obese appearing, alert and oriented x 3 today.   HEENT: normocephalic, atraumatic; sclera clear, conjunctiva pink; hearing intact; oropharynx clear; neck supple Lungs- Clear to ausculation bilaterally,  normal work of breathing.  No wheezes, rales, rhonchi Heart- Regular rate and  rhythm with frequent ectopy  GI- obese, non-tender, non-distended, bowel sounds present  Extremities- no clubbing, cyanosis, bilateral leg wraps MS- no significant deformity or atrophy Skin- warm and dry, no rash or lesion  Psych- euthymic mood, full affect Neuro- strength and sensation are intact   EKG:  EKG is not ordered today.  Recent Labs: No results found for requested labs within last 8760 hours.    Other studies Reviewed: Additional studies/ records that were reviewed today include: Dr Jackalyn Lombard office notes  Assessment and Plan: 1.  Persistent atrial fibrillation Amiodarone previously discontinued 2/2 tremor Burden by symptoms stable CHADS2VASC is 4, no OAC 2/2 falls.   If AF burden increases, Tikosyn or Sotalol would be options. Per Dr Jackalyn Lombard last note, she would prefer Sotalol   2.  CAD No recent ischemic symptoms Continue medical therapy   3.  Morbid obesity Body mass index is 38.95 kg/m. Weight loss encouraged  4.  Chronic diastolic heart failure Stable Continue current therapy    Current medicines are reviewed at length with the patient today.   The patient does not have concerns regarding her medicines.  The following changes were made today:  none  Labs/ tests ordered today include: none  No orders of the defined types were placed in this encounter.    Disposition:   Follow up with Dr Rayann Heman in 6 months      Signed, Chanetta Marshall, NP 02/09/2017 10:02 AM   [facility] [address] [place] [address] [phone] (office) [phone] (fax)

## 2017-02-09 ENCOUNTER — Encounter: Payer: Self-pay | Admitting: Nurse Practitioner

## 2017-02-09 ENCOUNTER — Ambulatory Visit (INDEPENDENT_AMBULATORY_CARE_PROVIDER_SITE_OTHER): Payer: Medicare Other | Admitting: Nurse Practitioner

## 2017-02-09 VITALS — BP 116/64 | HR 85 | Ht <= 58 in | Wt 180.0 lb

## 2017-02-09 DIAGNOSIS — I481 Persistent atrial fibrillation: Secondary | ICD-10-CM

## 2017-02-09 DIAGNOSIS — I5032 Chronic diastolic (congestive) heart failure: Secondary | ICD-10-CM | POA: Diagnosis not present

## 2017-02-09 DIAGNOSIS — I4819 Other persistent atrial fibrillation: Secondary | ICD-10-CM

## 2017-02-09 DIAGNOSIS — I2510 Atherosclerotic heart disease of native coronary artery without angina pectoris: Secondary | ICD-10-CM | POA: Diagnosis not present

## 2017-02-09 NOTE — Patient Instructions (Signed)
Medication Instructions:  Your physician recommends that you continue on your current medications as directed. Please refer to the Current Medication list given to you today.   Labwork: None Ordered   Testing/Procedures: None Ordered  Follow-Up: Your physician wants you to follow-up in: 6 months with Dr. Rayann Heman. You will receive a reminder letter in the mail two months in advance. If you don't receive a letter, please call our office to schedule the follow-up appointment.   Any Other Special Instructions Will Be Listed Below (If Applicable).     If you need a refill on your cardiac medications before your next appointment, please call your pharmacy.

## 2017-02-14 ENCOUNTER — Telehealth: Payer: Self-pay | Admitting: Gastroenterology

## 2017-02-14 MED ORDER — FIDAXOMICIN 200 MG PO TABS: 200.0000 mg | ORAL_TABLET | Freq: Two times a day (BID) | ORAL | 0 refills | Status: AC

## 2017-02-14 MED ORDER — SACCHAROMYCES BOULARDII 250 MG PO CAPS: 250.0000 mg | ORAL_CAPSULE | Freq: Two times a day (BID) | ORAL | 1 refills | Status: DC

## 2017-02-14 MED ORDER — VANCOMYCIN HCL 250 MG PO CAPS: 250.0000 mg | ORAL_CAPSULE | Freq: Four times a day (QID) | ORAL | 0 refills | Status: DC

## 2017-02-14 NOTE — Telephone Encounter (Signed)
Dificid 200 mg po bid for 10 days Florastor bid for 2 months

## 2017-02-14 NOTE — Telephone Encounter (Signed)
Patient notified of new recommendations Dificid is covered at 100 % for her according to her pharmacy.  They will have to order it and it will be in tomorrow pm.  Patient is asked to contact the pharmacy and make sure that it comes in prior to heading to the pharmacy

## 2017-02-14 NOTE — Telephone Encounter (Signed)
Patient notified of the results and recommendations She will call back if her symptoms fail to improve

## 2017-02-14 NOTE — Telephone Encounter (Signed)
Patient with a history of c-diff that has been treated x 2 with vancomycin. Patient reports that her diarrhea has returned.  She is having very watery diarrhea again with great urgency.  She completed her vancomycin on Tuesday of last week symptoms returned on Thursday.  Please advise

## 2017-02-14 NOTE — Telephone Encounter (Signed)
Since it will be costly for her to obtain Dificid, which is my first recommendation, then: Vancomycin 250 mg po qid for 14d, then bid for 7d, then qd for 7d, then qod for 3 weeks, then every 3rd day for 3 weeks If relapse will need Dificid or FMT

## 2017-02-14 NOTE — Telephone Encounter (Signed)
Patient's insurance will cover the Dificid RX , but the patient is in the donut hold and her out of pocket expense is over $1300.  I contacted [employer] (we have not been assigned a rep)  and they do not have samples they provide for Dificid.  Patient may qualify for an assistance program, but may take up to 7-10 days to get a response once they receive the paperwork.  Please advise alternative medication.  Patient can't afford her co-pay

## 2017-02-23 DIAGNOSIS — E039 Hypothyroidism, unspecified: Secondary | ICD-10-CM | POA: Diagnosis not present

## 2017-03-24 DIAGNOSIS — Z23 Encounter for immunization: Secondary | ICD-10-CM | POA: Diagnosis not present

## 2017-03-29 DIAGNOSIS — Z79899 Other long term (current) drug therapy: Secondary | ICD-10-CM | POA: Diagnosis not present

## 2017-03-29 DIAGNOSIS — H53002 Unspecified amblyopia, left eye: Secondary | ICD-10-CM | POA: Diagnosis not present

## 2017-03-29 DIAGNOSIS — H04123 Dry eye syndrome of bilateral lacrimal glands: Secondary | ICD-10-CM | POA: Diagnosis not present

## 2017-04-13 DIAGNOSIS — Z79899 Other long term (current) drug therapy: Secondary | ICD-10-CM | POA: Diagnosis not present

## 2017-04-13 DIAGNOSIS — E039 Hypothyroidism, unspecified: Secondary | ICD-10-CM | POA: Diagnosis not present

## 2017-04-17 DIAGNOSIS — M150 Primary generalized (osteo)arthritis: Secondary | ICD-10-CM | POA: Diagnosis not present

## 2017-04-17 DIAGNOSIS — M1A09X0 Idiopathic chronic gout, multiple sites, without tophus (tophi): Secondary | ICD-10-CM | POA: Diagnosis not present

## 2017-04-17 DIAGNOSIS — M810 Age-related osteoporosis without current pathological fracture: Secondary | ICD-10-CM | POA: Diagnosis not present

## 2017-04-17 DIAGNOSIS — Z79899 Other long term (current) drug therapy: Secondary | ICD-10-CM | POA: Diagnosis not present

## 2017-04-17 DIAGNOSIS — M5136 Other intervertebral disc degeneration, lumbar region: Secondary | ICD-10-CM | POA: Diagnosis not present

## 2017-04-20 DIAGNOSIS — E039 Hypothyroidism, unspecified: Secondary | ICD-10-CM | POA: Diagnosis not present

## 2017-06-21 ENCOUNTER — Other Ambulatory Visit: Payer: Self-pay | Admitting: Internal Medicine

## 2017-06-23 ENCOUNTER — Other Ambulatory Visit: Payer: Self-pay | Admitting: Pulmonary Disease

## 2017-07-18 DIAGNOSIS — M1A09X0 Idiopathic chronic gout, multiple sites, without tophus (tophi): Secondary | ICD-10-CM | POA: Diagnosis not present

## 2017-07-18 DIAGNOSIS — M5136 Other intervertebral disc degeneration, lumbar region: Secondary | ICD-10-CM | POA: Diagnosis not present

## 2017-07-18 DIAGNOSIS — M47816 Spondylosis without myelopathy or radiculopathy, lumbar region: Secondary | ICD-10-CM | POA: Diagnosis not present

## 2017-07-18 DIAGNOSIS — Z79899 Other long term (current) drug therapy: Secondary | ICD-10-CM | POA: Diagnosis not present

## 2017-07-18 DIAGNOSIS — M47814 Spondylosis without myelopathy or radiculopathy, thoracic region: Secondary | ICD-10-CM | POA: Diagnosis not present

## 2017-07-18 DIAGNOSIS — M150 Primary generalized (osteo)arthritis: Secondary | ICD-10-CM | POA: Diagnosis not present

## 2017-07-18 DIAGNOSIS — M549 Dorsalgia, unspecified: Secondary | ICD-10-CM | POA: Diagnosis not present

## 2017-07-18 DIAGNOSIS — M810 Age-related osteoporosis without current pathological fracture: Secondary | ICD-10-CM | POA: Diagnosis not present

## 2017-08-01 ENCOUNTER — Other Ambulatory Visit: Payer: Medicare Other

## 2017-08-01 ENCOUNTER — Ambulatory Visit: Payer: Medicare Other | Admitting: Gastroenterology

## 2017-08-01 ENCOUNTER — Encounter: Payer: Self-pay | Admitting: Gastroenterology

## 2017-08-01 VITALS — BP 118/72 | HR 74 | Ht <= 58 in | Wt 162.0 lb

## 2017-08-01 DIAGNOSIS — R197 Diarrhea, unspecified: Secondary | ICD-10-CM

## 2017-08-01 NOTE — Patient Instructions (Signed)
Your physician has requested that you go to the basement for the following lab work before leaving today: GI pathogen panel and C. Diff toxin.   You can take Imodium 1-2 tablets daily.   Drink G2 Gatorade to hydrate in place of water.   Thank you for choosing me and [facility].  Pricilla Riffle. Dagoberto Ligas., MD., [facility] Regal

## 2017-08-01 NOTE — Progress Notes (Signed)
    History of Present Illness: This is a 72 year old female with recurrent diarrhea.  She is accompanied by her daughter.  She has had recurrent C. difficile and completed pulse taper regimen in December.  Her diarrhea had completely abated.  Over the past 2 weeks she has had occasional looser stools and occasional normal stools however beginning last Friday she developed the onset of severe watery diarrhea going approximately 15 times per day with 2-3 episodes overnight.  She had blood work performed within the past few days and the patient relates her sodium was slightly elevated and repeat blood work is planned for tomorrow.  She denies fevers, chills, nausea, vomiting, abdominal pain.  Current Medications, Allergies, Past Medical History, Past Surgical History, Family History and Social History were reviewed in [employer] record.  Physical Exam: General: Well developed, well nourished, no acute distress Head: Normocephalic and atraumatic Eyes:  sclerae anicteric, EOMI Ears: Normal auditory acuity Mouth: No deformity or lesions Lungs: Clear throughout to auscultation Heart: Regular rate and rhythm; no murmurs, rubs or bruits Abdomen: Soft, non tender and non distended. No masses, hepatosplenomegaly or hernias noted. Normal Bowel sounds Rectal: not done Musculoskeletal: Symmetrical with no gross deformities  Pulses:  Normal pulses noted Extremities: No clubbing, cyanosis, edema or deformities noted Neurological: Alert oriented x 4, grossly nonfocal Psychological:  Alert and cooperative. Normal mood and affect  Assessment and Recommendations:  1.  Watery diarrhea.  Suspected recurrent C. difficile.  Obtain stool studies.  She may use Imodium once or twice daily to slow the diarrhea but not to completely stop it.  She is advised to drink Pedialyte or G2 alternating with water.  Complete blood work Architectural technologist.  Consider Dificid, another pulse taper vancomycin regimen  however fecal microbiota transplant may be required.

## 2017-08-02 DIAGNOSIS — Z7901 Long term (current) use of anticoagulants: Secondary | ICD-10-CM | POA: Diagnosis not present

## 2017-08-03 LAB — GASTROINTESTINAL PATHOGEN PANEL PCR
C. difficile Tox A/B, PCR: DETECTED — AB
Campylobacter, PCR: NOT DETECTED
Cryptosporidium, PCR: NOT DETECTED
E coli (ETEC) LT/ST PCR: NOT DETECTED
E coli (STEC) stx1/stx2, PCR: NOT DETECTED
E coli 0157, PCR: NOT DETECTED
Giardia lamblia, PCR: NOT DETECTED
Norovirus, PCR: NOT DETECTED
Rotavirus A, PCR: NOT DETECTED
Salmonella, PCR: NOT DETECTED
Shigella, PCR: NOT DETECTED

## 2017-08-03 LAB — CLOSTRIDIUM DIFFICILE EIA: C difficile Toxins A+B, EIA: NEGATIVE

## 2017-08-04 ENCOUNTER — Other Ambulatory Visit: Payer: Self-pay

## 2017-08-04 ENCOUNTER — Telehealth: Payer: Self-pay | Admitting: Gastroenterology

## 2017-08-04 MED ORDER — VANCOMYCIN HCL 125 MG PO CAPS: ORAL_CAPSULE | ORAL | 0 refills | Status: DC

## 2017-08-04 MED ORDER — FIDAXOMICIN 200 MG PO TABS: 200.0000 mg | ORAL_TABLET | Freq: Two times a day (BID) | ORAL | 0 refills | Status: DC

## 2017-08-04 MED ORDER — VANCOMYCIN HCL 250 MG PO CAPS: ORAL_CAPSULE | ORAL | 0 refills | Status: DC

## 2017-08-04 NOTE — Telephone Encounter (Signed)
Repeat the same vancomycin 250 mg pulse taper she received a few months ago

## 2017-08-04 NOTE — Telephone Encounter (Signed)
Patient cannot afford Dificid. Would you like to try Vancomycin or another alternative instead?

## 2017-08-04 NOTE — Telephone Encounter (Signed)
Patient's husband notified of vancomycin rx  rx faxed

## 2017-08-09 ENCOUNTER — Encounter: Payer: Self-pay | Admitting: Internal Medicine

## 2017-08-09 ENCOUNTER — Ambulatory Visit: Payer: Medicare Other | Admitting: Internal Medicine

## 2017-08-09 VITALS — BP 120/70 | HR 79 | Ht <= 58 in | Wt 163.0 lb

## 2017-08-09 DIAGNOSIS — I5032 Chronic diastolic (congestive) heart failure: Secondary | ICD-10-CM | POA: Diagnosis not present

## 2017-08-09 DIAGNOSIS — I10 Essential (primary) hypertension: Secondary | ICD-10-CM | POA: Diagnosis not present

## 2017-08-09 DIAGNOSIS — I480 Paroxysmal atrial fibrillation: Secondary | ICD-10-CM

## 2017-08-09 NOTE — Progress Notes (Signed)
PCP: [name], MD Primary Cardiologist: Dr Verl Blalock Primary EP: Dr Rayann Heman  [name] is a 72 y.o. female who presents today for routine electrophysiology followup.  Since last being seen in our clinic, the patient reports doing reasonably well.  She has been having issues with CDiff.  Losing weight.  Today, she denies symptoms of palpitations, chest pain,  dizziness, presyncope, or syncope.  SOB and edema are chronic and stable.  The patient is otherwise without complaint today.   Past Medical History:  Diagnosis Date  . Allergy   . Anemia   . Bilateral leg edema   . CAD (coronary artery disease)    a. LHC 03/2006: EF 65%, mid LAD 40-50%, ostial D1 70-80%, normal circumflex, normal RCA;  b. Lexiscan Myoview 09/2010: No ischemia or scar, EF 75%;  c. Lex MV 5/14:  Normal, no ischemia, EF 71%  . Cataract    bilateral-had surgery  . Cellulitis of right leg    Hx - resolved  . Chronic diastolic CHF (congestive heart failure) ([place])    a. Echo 8/14:  Mild LVH, EF 55-65%, normal wall motion, Tr AI, mild MR, mod TR, PASP 45;  b. 12/2014 Echo: EF 60-65%, nl wall motion, mildly dil RA/LA, PASP 61mmHg.  Marland Kitchen Chronic diastolic heart failure ([place])   . Continuous urine leakage   . Dermatomyositis ([place])   . Deviated septum   . Essential hypertension   . Gall stones    a. 08/2014 Abd U/S:  Large gallstone measuring 4.2 cm.  Marland Kitchen GERD (gastroesophageal reflux disease)   . Gout   . Hiatal hernia   . History of pancreatitis   . History of PFTs    a. PFTs 10/2012: normal.   . Hyperlipidemia   . Hypothyroidism   . Internal hemorrhoids   . Morbid obesity ([place])   . Osteoarthritis    spine hips knees  . PAF (paroxysmal atrial fibrillation) ([facility])    a. CHA2DS2VASc = 5-->poor OAC candidate 2/2 falls. No problems now per patient 03/2016  . Pancreatitis   . Pneumonia   . Seizures ([place])    as a baby  . Shingles    Hx  . Sleep apnea    Does not use CPAP  . Spinal cord compression ([place]) 02/21/2014     Past Surgical History:  Procedure Laterality Date  . CATARACT EXTRACTION, BILATERAL Bilateral   . COLONOSCOPY  03/2011  . NASAL SEPTUM SURGERY    . TOTAL ABDOMINAL HYSTERECTOMY    . VERTEBROPLASTY  Sept 7 and May 30, 2014   x 2, T11/T12  . WISDOM TOOTH EXTRACTION      ROS- all systems are reviewed and negatives except as per HPI above  Current Outpatient Medications  Medication Sig Dispense Refill  . acetaminophen (TYLENOL) 500 MG tablet Take 500-1,000 mg by mouth every 6 (six) hours as needed for mild pain or fever.     Marland Kitchen alendronate (FOSAMAX) 70 MG tablet Take 1 tablet by mouth every Tuesday.     Marland Kitchen allopurinol (ZYLOPRIM) 100 MG tablet Take 100 mg by mouth 2 (two) times daily.     Marland Kitchen aspirin EC 325 MG tablet Take 325 mg by mouth daily.    Marland Kitchen CALCIUM PO Take 500 mg by mouth daily.    . cetirizine (ZYRTEC) 10 MG tablet Take 10 mg by mouth daily.    . Cholecalciferol (VITAMIN D3) 2000 UNITS TABS Take 2,000 Units by mouth daily.     . fidaxomicin (  DIFICID) 200 MG TABS tablet Take 1 tablet (200 mg total) by mouth 2 (two) times daily. 20 tablet 0  . FOLIC ACID PO Take 1 tablet by mouth 4 (four) times daily.     . furosemide (LASIX) 40 MG tablet Take 1 tablet (40 mg total) by mouth 2 (two) times daily. 120 tablet 5  . hydroxychloroquine (PLAQUENIL) 200 MG tablet Take 200 mg by mouth 2 (two) times daily.     Marland Kitchen levothyroxine (SYNTHROID, LEVOTHROID) 100 MCG tablet Take 100 mcg by mouth daily.    . methotrexate (RHEUMATREX) 2.5 MG tablet Take 5 tablets by mouth every Wednesday.   3  . metoprolol succinate (TOPROL-XL) 25 MG 24 hr tablet TAKE 1 TABLET BY MOUTH  DAILY 90 tablet 1  . Multiple Vitamin (MULTIVITAMIN) tablet Take 1 tablet by mouth daily.      Marland Kitchen nystatin (MYCOSTATIN/NYSTOP) 100000 UNIT/GM POWD Apply 1 g topically 2 (two) times daily. Applies to stomach    . OVER THE COUNTER MEDICATION Place 1 drop into both eyes daily as needed (for dry eyes. Advanced eye relief).    . pantoprazole  (PROTONIX) 40 MG tablet TAKE 1 TABLET BY MOUTH 2  TIMES DAILY BEFORE MEAL 180 tablet 0  . potassium chloride SA (KLOR-CON M20) 20 MEQ tablet Take 1 tablet (20 mEq total) by mouth 3 (three) times daily. 60 tablet 11  . predniSONE (DELTASONE) 5 MG tablet Take 5 mg by mouth daily with breakfast.    . rosuvastatin (CRESTOR) 5 MG tablet Take 5 mg by mouth every other day.     . saccharomyces boulardii (FLORASTOR) 250 MG capsule Take 1 capsule (250 mg total) by mouth 2 (two) times daily. 60 capsule 1  . vancomycin (VANCOCIN HCL) 250 MG capsule Take 1 tablet by mouth qid for 14 days, two times a day for 7 days, one time a day for 21 days, every other day for 21 days, every third day for 21 days 109 capsule 0   No current facility-administered medications for this visit.     Physical Exam: Vitals:   08/09/17 1005  BP: 120/70  Pulse: 79  Weight: 163 lb (73.9 kg)  Height: 4\' 10"  (1.473 m)    GEN- The patient is overweight appearing, alert and oriented x 3 today.   Head- normocephalic, atraumatic Eyes-  Sclera clear, conjunctiva pink Ears- hearing intact Oropharynx- clear Lungs- Clear to ausculation bilaterally, normal work of breathing Heart- Regular rate and rhythm, no murmurs, rubs or gallops, PMI not laterally displaced GI- soft, NT, ND, + BS Extremities- no clubbing, cyanosis, or edema  EKG tracing ordered today is personally reviewed and shows sinus rhythm 79 bpm, PR 168 msec, nonspecific St/T changes  Assessment and Plan:  1. Atrial fibrillation She has done well off of amiodarone (stopped due to tremors).  No symptoms of afib in over a year.   Could consider sotalol if further afib Given prior falls, she is not on anticoagulation (chads2vasc score is 4)  2. CAD Stable No change required today  3. Chronic diastolic dysfunction Stable No change required today  4. Morbid obesity Body mass index is 34.07 kg/m.  Follow-up with EP NP every 9-12 months I will see when  needed  [name] MD, [facility] 08/09/2017 10:36 AM

## 2017-08-09 NOTE — Patient Instructions (Signed)
Medication Instructions:  Your physician recommends that you continue on your current medications as directed. Please refer to the Current Medication list given to you today.   Labwork: None ordered  Testing/Procedures: None ordered  Follow-Up: Your physician wants you to follow-up in: 9 months with Chanetta Marshall, NP You will receive a reminder letter in the mail two months in advance. If you don't receive a letter, please call our office to schedule the follow-up appointment.   Any Other Special Instructions Will Be Listed Below (If Applicable).     If you need a refill on your cardiac medications before your next appointment, please call your pharmacy.

## 2017-08-17 DIAGNOSIS — E559 Vitamin D deficiency, unspecified: Secondary | ICD-10-CM | POA: Diagnosis not present

## 2017-08-17 DIAGNOSIS — N390 Urinary tract infection, site not specified: Secondary | ICD-10-CM | POA: Diagnosis not present

## 2017-08-17 DIAGNOSIS — I10 Essential (primary) hypertension: Secondary | ICD-10-CM | POA: Diagnosis not present

## 2017-08-17 DIAGNOSIS — E039 Hypothyroidism, unspecified: Secondary | ICD-10-CM | POA: Diagnosis not present

## 2017-08-17 DIAGNOSIS — E785 Hyperlipidemia, unspecified: Secondary | ICD-10-CM | POA: Diagnosis not present

## 2017-08-21 ENCOUNTER — Ambulatory Visit: Payer: Medicare Other | Admitting: Nurse Practitioner

## 2017-08-21 ENCOUNTER — Encounter: Payer: Self-pay | Admitting: Nurse Practitioner

## 2017-08-21 ENCOUNTER — Telehealth: Payer: Self-pay | Admitting: Nurse Practitioner

## 2017-08-21 VITALS — BP 118/76 | HR 78 | Ht <= 58 in | Wt 164.0 lb

## 2017-08-21 DIAGNOSIS — A0472 Enterocolitis due to Clostridium difficile, not specified as recurrent: Secondary | ICD-10-CM

## 2017-08-21 NOTE — Patient Instructions (Signed)
If you are age 71 or older, your body mass index should be between 23-30. Your Body mass index is 35.49 kg/m. If this is out of the aforementioned range listed, please consider follow up with your Primary Care Provider.  If you are age 43 or younger, your body mass index should be between 19-25. Your Body mass index is 35.49 kg/m. If this is out of the aformentioned range listed, please consider follow up with your Primary Care Provider.   Complete Vancomycin.  Continue Florastor for one month after completion of Vancomycin.  Slowly add Fiber back into diet.  Thank you for choosing me and [facility].   Tye Savoy, NP

## 2017-08-21 NOTE — Progress Notes (Signed)
IMPRESSION and PLAN:     72 yo female with recurrent C-diff. On pulse tapering dose of oral Vancomycin plus florastor and diarrhea resolving. In fact, having pieces of broken stool, sounds like she may be getting constipated from low fiber diet.  -complete course of vancomycin -continue florastor for additional month after complete of vancomycin -slowly reintroduce fiber into diet.  -screening colonoscopy due 2022     HPI:    Chief Complaint: follow up on c-diff   Patient is a 72 yo female with multiple medical problems not limited to AFIB, chronic diastolic heart failure, and dermatomyositis. She is known to Dr. Fuller Plan. She has a history of C-diff., completed pulse taper dose of Vanco in December with resolution of diarrhea. Saw Dr. Fuller Plan last month with recurrent diarrhea. C-diff tox A/B PCR was positive, we prescribed Dificid but it was too expensive.  Subsequently given another tapering dose of oral Vancomycin which she is still on. No longer having diarrhea. Having pieces of solid stools. No rectal bleeding. No recent fevers. No further cramping. She does have increased gas and some bloating. Basically following a low fiber diet. Drinking a lot of water and diluted gastorade every day.   Review of systems:   No significant weight loss. No chest pain. No SOB.     Past Medical History:  Diagnosis Date  . Allergy   . Anemia   . Bilateral leg edema   . C. difficile enteritis   . CAD (coronary artery disease)    a. LHC 03/2006: EF 65%, mid LAD 40-50%, ostial D1 70-80%, normal circumflex, normal RCA;  b. Lexiscan Myoview 09/2010: No ischemia or scar, EF 75%;  c. Lex MV 5/14:  Normal, no ischemia, EF 71%  . Cataract    bilateral-had surgery  . Cellulitis of right leg    Hx - resolved  . Chronic diastolic CHF (congestive heart failure) ([place])    a. Echo 8/14:  Mild LVH, EF 55-65%, normal wall motion, Tr AI, mild MR, mod TR, PASP 45;  b. 12/2014 Echo: EF 60-65%, nl wall motion,  mildly dil RA/LA, PASP 46mmHg.  Marland Kitchen Chronic diastolic heart failure ([place])   . Continuous urine leakage   . Dermatomyositis ([place])   . Deviated septum   . Essential hypertension   . Gall stones    a. 08/2014 Abd U/S:  Large gallstone measuring 4.2 cm.  Marland Kitchen GERD (gastroesophageal reflux disease)   . Gout   . Hiatal hernia   . History of pancreatitis   . History of PFTs    a. PFTs 10/2012: normal.   . Hyperlipidemia   . Hypothyroidism   . Internal hemorrhoids   . Morbid obesity ([place])   . Osteoarthritis    spine hips knees  . PAF (paroxysmal atrial fibrillation) ([facility])    a. CHA2DS2VASc = 5-->poor OAC candidate 2/2 falls. No problems now per patient 03/2016  . Pancreatitis   . Pneumonia   . Seizures ([place])    as a baby  . Shingles    Hx  . Sleep apnea    Does not use CPAP  . Spinal cord compression ([place]) 02/21/2014    Patient's surgical history, family medical history, social history, medications and allergies were all reviewed in Epic    Physical Exam:     BP 118/76   Pulse 78   Ht 4\' 9"  (1.448 m)   Wt 164 lb (74.4 kg)   BMI 35.49 kg/m   GENERAL:  Pleasant  white female in NAD PSYCH: :ooperative, normal affect EENT:  conjunctiva pink, mucous membranes moist, neck supple without masses CARDIAC:  RRR, no peripheral edema PULM: Normal respiratory effort, lungs CTA bilaterally, no wheezing ABDOMEN:  Nondistended, soft, nontender. No obvious masses,  normal bowel sounds SKIN:  turgor, no lesions seen Musculoskeletal:  Normal muscle tone, normal strength NEURO: Alert and oriented x 3, no focal neurologic deficits   Tye Savoy , NP 08/21/2017, 11:33 AM

## 2017-08-22 ENCOUNTER — Encounter: Payer: Self-pay | Admitting: Nurse Practitioner

## 2017-08-22 NOTE — Telephone Encounter (Signed)
Patient had a urine culture by her PCP. She grew Klebsiella and has been given Macrobid. Discussed with Tye Savoy, NP Patient will let us know if she has any problems. Take her medications as instructed. No changes. She will take the [place].

## 2017-08-24 DIAGNOSIS — N390 Urinary tract infection, site not specified: Secondary | ICD-10-CM | POA: Diagnosis not present

## 2017-08-24 DIAGNOSIS — I2510 Atherosclerotic heart disease of native coronary artery without angina pectoris: Secondary | ICD-10-CM | POA: Diagnosis not present

## 2017-08-24 DIAGNOSIS — K219 Gastro-esophageal reflux disease without esophagitis: Secondary | ICD-10-CM | POA: Diagnosis not present

## 2017-08-24 DIAGNOSIS — Z0000 Encounter for general adult medical examination without abnormal findings: Secondary | ICD-10-CM | POA: Diagnosis not present

## 2017-08-24 DIAGNOSIS — R7989 Other specified abnormal findings of blood chemistry: Secondary | ICD-10-CM | POA: Diagnosis not present

## 2017-08-24 DIAGNOSIS — I10 Essential (primary) hypertension: Secondary | ICD-10-CM | POA: Diagnosis not present

## 2017-08-25 ENCOUNTER — Other Ambulatory Visit: Payer: Self-pay | Admitting: Pulmonary Disease

## 2017-08-26 NOTE — Progress Notes (Signed)
Reviewed and agree with management plan.  Tidus Upchurch T. Melea Prezioso, MD FACG 

## 2017-09-08 ENCOUNTER — Encounter: Payer: Self-pay | Admitting: Pulmonary Disease

## 2017-09-08 ENCOUNTER — Ambulatory Visit (INDEPENDENT_AMBULATORY_CARE_PROVIDER_SITE_OTHER)
Admission: RE | Admit: 2017-09-08 | Discharge: 2017-09-08 | Disposition: A | Discharge status: Home / Self Care | Payer: Medicare Other | Source: Ambulatory Visit | Attending: Pulmonary Disease | Admitting: Pulmonary Disease

## 2017-09-08 ENCOUNTER — Ambulatory Visit: Payer: Medicare Other | Admitting: Pulmonary Disease

## 2017-09-08 VITALS — BP 122/78 | HR 88 | Ht <= 58 in | Wt 165.0 lb

## 2017-09-08 DIAGNOSIS — R05 Cough: Secondary | ICD-10-CM | POA: Diagnosis not present

## 2017-09-08 DIAGNOSIS — R0602 Shortness of breath: Secondary | ICD-10-CM | POA: Diagnosis not present

## 2017-09-08 DIAGNOSIS — R059 Cough, unspecified: Secondary | ICD-10-CM

## 2017-09-08 DIAGNOSIS — R0609 Other forms of dyspnea: Secondary | ICD-10-CM

## 2017-09-08 DIAGNOSIS — R0600 Dyspnea, unspecified: Secondary | ICD-10-CM

## 2017-09-08 MED ORDER — MONTELUKAST SODIUM 10 MG PO TABS: 10.0000 mg | ORAL_TABLET | Freq: Every day | ORAL | 2 refills | Status: DC

## 2017-09-08 NOTE — Progress Notes (Signed)
[name]    [id number]    Nov 25, 1945  Primary Care Physician:Kim, Jeneen Rinks, MD  Referring Physician: [name], MD [address] [place], [place] [address]  Chief complaint:   Follow up for Dermatomyositis Mod Pulm HTN OSA. Non compliant  HPI: [name] is a 72 year old with a complex past medical history which includes dermatomyositis, moderate pulmonary hypertension, OSA [noncompliant with CPAP], atrial fibrillation.  She complains of chronic nonproductive cough. Problem has been going on for several years. This is occasionally associated with dyspnea and wheezing. She says that exposure to strong perfumes and cold air, heat, humidity makes his symptoms worse. She has spring and fall allergies. At present she denies any rhinitis, postnasal drip, sinus pain or pressure. She has history of GERD and is on omeprazole for that. She has history of dermatomyositis and is maintained on methotrexate, Plaquenil and low-dose prednisone (5 mg). She has history of hiatal hernia, esophageal dysmotility, dysphagia. She has episodes of choking on food.  She denies any sputum production, hemoptysis. She is admitted in March 2017 with lower extremity cellulitis. She had a CT scan at that time that showed small left lower lobe pneumonia. She also had a right subpleural pulmonary nodule that needs to be followed up. She is doing well post discharge.  She was diagnosed with OSA many years ago. She uses the CPAP for a while but then stopped using it since it got very uncomfortable and she felt that the CPAP is blowing air into her hiatal hernia and stomach. She denies any snoring or daytime somnolence.  Interim History: Stable respiratory symptoms.  She has chronic dyspnea on exertion.  Denies any cough, sputum production, fevers, chills Complains of increased rhinitis, sinus congestion for the past few weeks. She is being treated by [facility] for recurrent C.  difficile.  Outpatient Encounter Medications as of 09/08/2017  Medication Sig  . acetaminophen (TYLENOL) 500 MG tablet Take 500-1,000 mg by mouth every 6 (six) hours as needed for mild pain or fever.   Marland Kitchen alendronate (FOSAMAX) 70 MG tablet Take 1 tablet by mouth every Tuesday.   Marland Kitchen allopurinol (ZYLOPRIM) 100 MG tablet Take 100 mg by mouth 2 (two) times daily.   Marland Kitchen aspirin EC 325 MG tablet Take 325 mg by mouth daily.  Marland Kitchen CALCIUM PO Take 500 mg by mouth daily.  . cetirizine (ZYRTEC) 10 MG tablet Take 10 mg by mouth daily.  . Cholecalciferol (VITAMIN D3) 2000 UNITS TABS Take 2,000 Units by mouth daily.   Marland Kitchen FOLIC ACID PO Take 1 tablet by mouth 4 (four) times daily.   . furosemide (LASIX) 40 MG tablet Take 1 tablet (40 mg total) by mouth 2 (two) times daily.  . hydroxychloroquine (PLAQUENIL) 200 MG tablet Take 200 mg by mouth 2 (two) times daily.   Marland Kitchen levothyroxine (SYNTHROID, LEVOTHROID) 100 MCG tablet Take 100 mcg by mouth daily.  . methotrexate (RHEUMATREX) 2.5 MG tablet Take 5 tablets by mouth every Wednesday.   . metoprolol succinate (TOPROL-XL) 25 MG 24 hr tablet TAKE 1 TABLET BY MOUTH  DAILY  . Multiple Vitamin (MULTIVITAMIN) tablet Take 1 tablet by mouth daily.    Marland Kitchen nystatin (MYCOSTATIN/NYSTOP) 100000 UNIT/GM POWD Apply 1 g topically 2 (two) times daily. Applies to stomach  . OVER THE COUNTER MEDICATION Place 1 drop into both eyes daily as needed (for dry eyes. Advanced eye relief).  . pantoprazole (PROTONIX) 40 MG tablet TAKE 1 TABLET BY  MOUTH 2  TIMES DAILY BEFORE MEAL  . potassium chloride SA (KLOR-CON M20) 20 MEQ tablet Take 1 tablet (20 mEq total) by mouth 3 (three) times daily.  . predniSONE (DELTASONE) 5 MG tablet Take 5 mg by mouth daily with breakfast.  . rosuvastatin (CRESTOR) 5 MG tablet Take 5 mg by mouth every other day.   . saccharomyces boulardii (FLORASTOR) 250 MG capsule Take 1 capsule (250 mg total) by mouth 2 (two) times daily.  . vancomycin (VANCOCIN HCL) 250 MG capsule Take  1 tablet by mouth qid for 14 days, two times a day for 7 days, one time a day for 21 days, every other day for 21 days, every third day for 21 days  . [DISCONTINUED] fidaxomicin (DIFICID) 200 MG TABS tablet Take 1 tablet (200 mg total) by mouth 2 (two) times daily.   No facility-administered encounter medications on file as of 09/08/2017.     Allergies as of 09/08/2017 - Review Complete 09/08/2017  Allergen Reaction Noted  . Butoconazole Itching, Rash, and Other (See Comments) 09/23/2010  . Lipitor [atorvastatin calcium] Other (See Comments) 08/17/2011  . Keflex [cephalexin] Diarrhea 03/13/2015    Past Medical History:  Diagnosis Date  . Allergy   . Anemia   . Bilateral leg edema   . C. difficile enteritis   . CAD (coronary artery disease)    a. LHC 03/2006: EF 65%, mid LAD 40-50%, ostial D1 70-80%, normal circumflex, normal RCA;  b. Lexiscan Myoview 09/2010: No ischemia or scar, EF 75%;  c. Lex MV 5/14:  Normal, no ischemia, EF 71%  . Cataract    bilateral-had surgery  . Cellulitis of right leg    Hx - resolved  . Chronic diastolic CHF (congestive heart failure) ([place])    a. Echo 8/14:  Mild LVH, EF 55-65%, normal wall motion, Tr AI, mild MR, mod TR, PASP 45;  b. 12/2014 Echo: EF 60-65%, nl wall motion, mildly dil RA/LA, PASP 55mmHg.  Marland Kitchen Chronic diastolic heart failure ([place])   . Continuous urine leakage   . Dermatomyositis ([address])   . Deviated septum   . Essential hypertension   . Gall stones    a. 08/2014 Abd U/S:  Large gallstone measuring 4.2 cm.  Marland Kitchen GERD (gastroesophageal reflux disease)   . Gout   . Hiatal hernia   . History of pancreatitis   . History of PFTs    a. PFTs 10/2012: normal.   . Hyperlipidemia   . Hypothyroidism   . Internal hemorrhoids   . Morbid obesity ([place])   . Osteoarthritis    spine hips knees  . PAF (paroxysmal atrial fibrillation) ([facility])    a. CHA2DS2VASc = 5-->poor OAC candidate 2/2 falls. No problems now per patient 03/2016  . Pancreatitis   .  Pneumonia   . Seizures ([place])    as a baby  . Shingles    Hx  . Sleep apnea    Does not use CPAP  . Spinal cord compression ([place]) 02/21/2014    Past Surgical History:  Procedure Laterality Date  . CATARACT EXTRACTION, BILATERAL Bilateral   . COLONOSCOPY  03/2011  . NASAL SEPTUM SURGERY    . TOTAL ABDOMINAL HYSTERECTOMY    . VERTEBROPLASTY  Sept 7 and May 30, 2014   x 2, T11/T12  . WISDOM TOOTH EXTRACTION      Family History  Problem Relation Age of Onset  . Colon cancer Mother        mets, spot on lungs and  spine  . Coronary artery disease Father   . Emphysema Father   . Heart attack Brother        x 2  . Colon cancer Paternal Uncle   . Kidney disease Brother   . Hypertension Brother   . Stroke Neg Hx   . Esophageal cancer Neg Hx     Social History   Socioeconomic History  . Marital status: Married    Spouse name: Not on file  . Number of children: 2  . Years of education: Not on file  . Highest education level: Not on file  Occupational History    Employer: UNEMPLOYED  Social Needs  . Financial resource strain: Not on file  . Food insecurity:    Worry: Not on file    Inability: Not on file  . Transportation needs:    Medical: Not on file    Non-medical: Not on file  Tobacco Use  . Smoking status: Never Smoker  . Smokeless tobacco: Never Used  Substance and Sexual Activity  . Alcohol use: No  . Drug use: No  . Sexual activity: Never    Birth control/protection: Post-menopausal  Lifestyle  . Physical activity:    Days per week: Not on file    Minutes per session: Not on file  . Stress: Not on file  Relationships  . Social connections:    Talks on phone: Not on file    Gets together: Not on file    Attends religious service: Not on file    Active member of club or organization: Not on file    Attends meetings of clubs or organizations: Not on file    Relationship status: Not on file  . Intimate partner violence:    Fear of current or ex partner:  Not on file    Emotionally abused: Not on file    Physically abused: Not on file    Forced sexual activity: Not on file  Other Topics Concern  . Not on file  Social History Narrative  . Not on file    Review of systems: Review of Systems  Constitutional: Negative for fever and chills.  HENT: Negative.   Eyes: Negative for blurred vision.  Respiratory: as per HPI  Cardiovascular: Negative for chest pain and palpitations.  Gastrointestinal: Negative for vomiting, diarrhea, blood per rectum. Genitourinary: Negative for dysuria, urgency, frequency and hematuria.  Musculoskeletal: Negative for myalgias, back pain and joint pain.  Skin: Negative for itching and rash.  Neurological: Negative for dizziness, tremors, focal weakness, seizures and loss of consciousness.  Endo/Heme/Allergies: Negative for environmental allergies.  Psychiatric/Behavioral: Negative for depression, suicidal ideas and hallucinations.  All other systems reviewed and are negative.  Physical Exam: Blood pressure 122/78, pulse 88, height 4\' 9"  (1.448 m), weight 165 lb (74.8 kg), SpO2 97 %. Gen:      No acute distress HEENT:  EOMI, sclera anicteric Neck:     No masses; no thyromegaly Lungs:    Clear to auscultation bilaterally; normal respiratory effort CV:         Regular rate and rhythm; no murmurs Abd:      + bowel sounds; soft, non-tender; no palpable masses, no distension Ext:    No edema; adequate peripheral perfusion Skin:      Warm and dry; no rash Neuro: alert and oriented x 3 Psych: normal mood and affect  Data Reviewed: PFTs (10/26/12) FVC 2.27 (89%), FEV1 1.89 (98%), TLC 86%, DLCO 97% No bronchodilator response. Normal spirometry, normal  lung volumes, normal diffusion.  PFTs [07/01/15] FVC 1.41 (57%), FEV1 1.14 (61%), F/F 81, TLC 60%, DLCO 60% Severe restriction, moderate reduction in diffusion capacity, no bronchodilator response  Echo (12/19/14) - Left ventricle: The cavity size was normal.  Wall thickness was normal. Systolic function was normal. The estimated ejection fraction was in the range of 60% to 65%. Wall motion was normal; there were no regional wall motion abnormalities. - Left atrium: The atrium was mildly dilated. - Right atrium: The atrium was mildly dilated. - Pulmonary arteries: Systolic pressure was moderately increased. PA peak pressure: 42 mm Hg (S).  Imaging CT scan 07/07/15 Images reviewed. No interstitial lung disease. Bronchiectasis, bronchial wall thickening  CT scan 08/13/15 New left lower lobe mild infiltrate. Stable subpleural nodules  CT abdomen 05/25/16  Linear subsegmental atelectasis in the left lower lobe. No pulmonary nodules identified in lung images. I reviewed all images personally  Assessment:  #1 Chronic cough. Cough is likely due to dysphagia, esophageal dysmotility, related to dermatomyositis. She does have GERD, seasonal allergies and rhinitis that may be contributing to the problem. Symptoms are stable on Protonix twice daily I do not suspect reactive airway disease/asthma. She was given albuterol PRN and Flovent last year but she has not used them as they were too expensive through her insurance. Besides she did not tolerate the albuterol because it make her jittery.  Complains of sinus congestion, allergic rhinitis.  Continue on Zyrtec.  I will add Singulair 10 mg daily for a few months until the allergy season passes.  #2 Dermatomyositis. She's is on methotrexate and Plaquenil, chronic prednisone. Her PFTs show a new reduction in lung volumes and diffusion capacity however the CT scan does not show any ILD. I suspect that the PFT changes are due to her obesity and body habitus.  Repeat chest x-ray.  #3 Moderate pulmonary hypertension. Likely related to her dermatomyositis, untreated OSA. Her PA pressures have remained stable. We'll continue to follow clincally. Get repeat echo if there is worsening resp  symptoms  #4 OSA This has been untreated for several years. She is intolerant of CPAP. She feels that her snoring has improved after she lost weight and does not want to use the CPAP again  Plan/Recommendations: - Continue protonix - Continue methorexate, plaquinel, prednisone - Continue Zyrtec, start Singulair for allergic rhinitis - Chest x-ray  Follow up in 1 year  Marshell Garfinkel MD [facility] Pager [phone] 09/08/2017, 1:38 PM  CC: [name], MD

## 2017-09-08 NOTE — Patient Instructions (Signed)
We will get a chest x-ray today Start you on Singulair Follow-up in 6 months.

## 2017-09-13 NOTE — Progress Notes (Signed)
Called spoke with patient, advised of cxr results / recs as stated by Dr. Vaughan Browner.  Pt verbalized understanding and denied any questions.

## 2017-09-27 DIAGNOSIS — Z1231 Encounter for screening mammogram for malignant neoplasm of breast: Secondary | ICD-10-CM | POA: Diagnosis not present

## 2017-09-28 DIAGNOSIS — H01002 Unspecified blepharitis right lower eyelid: Secondary | ICD-10-CM | POA: Diagnosis not present

## 2017-09-28 DIAGNOSIS — H04123 Dry eye syndrome of bilateral lacrimal glands: Secondary | ICD-10-CM | POA: Diagnosis not present

## 2017-09-28 DIAGNOSIS — H01005 Unspecified blepharitis left lower eyelid: Secondary | ICD-10-CM | POA: Diagnosis not present

## 2017-09-28 DIAGNOSIS — H01004 Unspecified blepharitis left upper eyelid: Secondary | ICD-10-CM | POA: Diagnosis not present

## 2017-09-28 DIAGNOSIS — H01001 Unspecified blepharitis right upper eyelid: Secondary | ICD-10-CM | POA: Diagnosis not present

## 2017-10-07 IMAGING — CT CT CHEST W/O CM
2 of 4 series · 15 of 36 positions shown, 18 images · non-contrast
Comparison: Plain film from earlier in the same day, 07/07/2015.

CLINICAL DATA: Cough and weakness

EXAM:
CT CHEST WITHOUT CONTRAST
TECHNIQUE: Multidetector CT imaging of the chest was performed following the
standard protocol without IV contrast.

[Series 2: rtn chest without st · axial · non-contrast · 0.75mm/px · z∈[-262,-38]mm · 12 of 55 slices shown, 15 images]
[im 5/55  mediastinal]
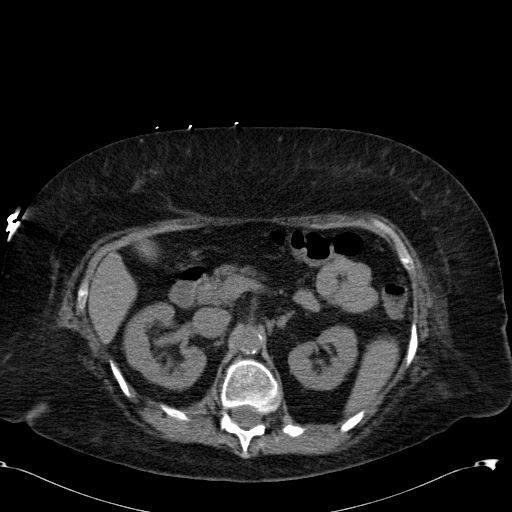
[im 5/55  lung]
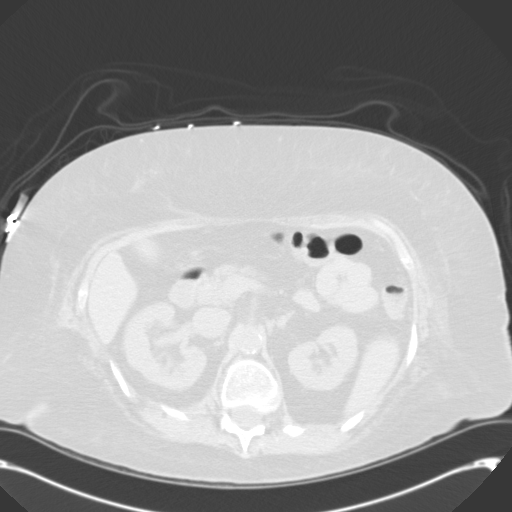
[im 9/55  lung]
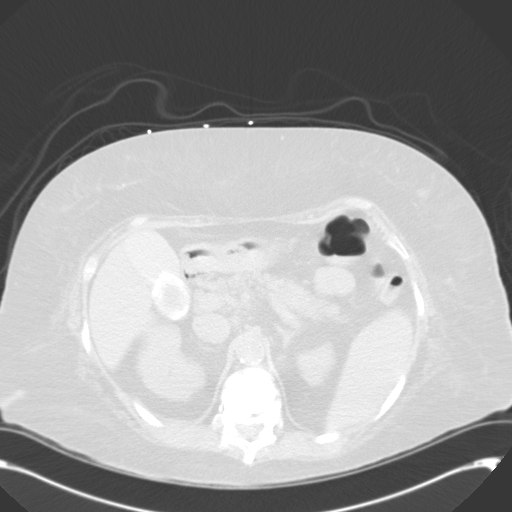
[im 13/55  lung]
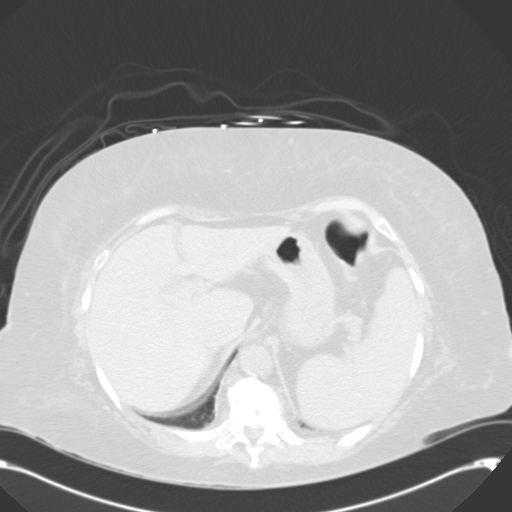
[im 17/55  lung]
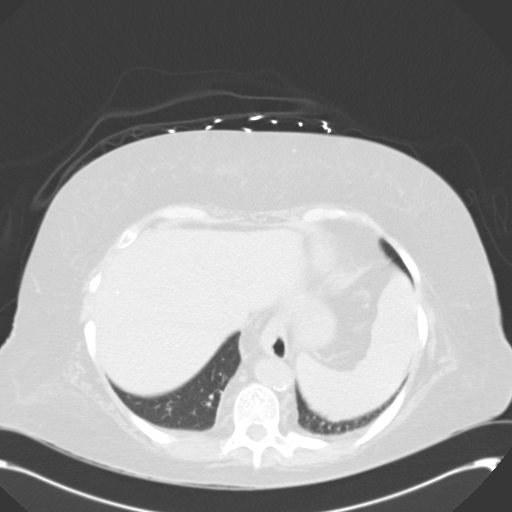
[im 21/55  mediastinal]
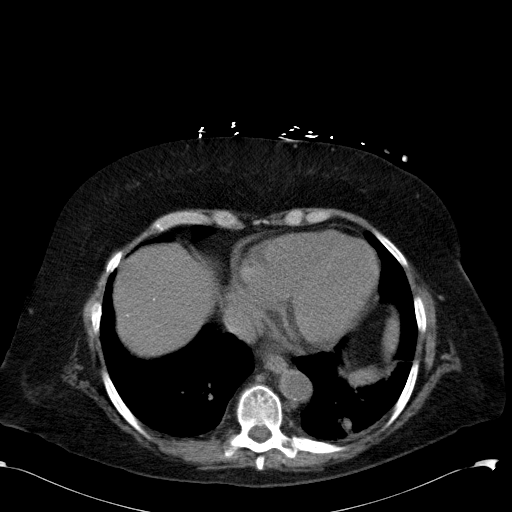
[im 21/55  lung]
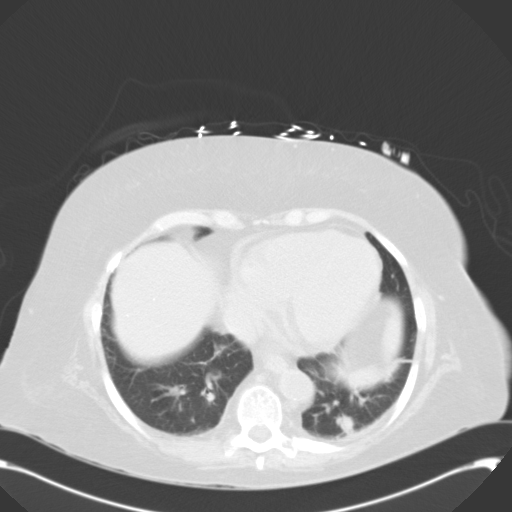
[im 25/55  lung]
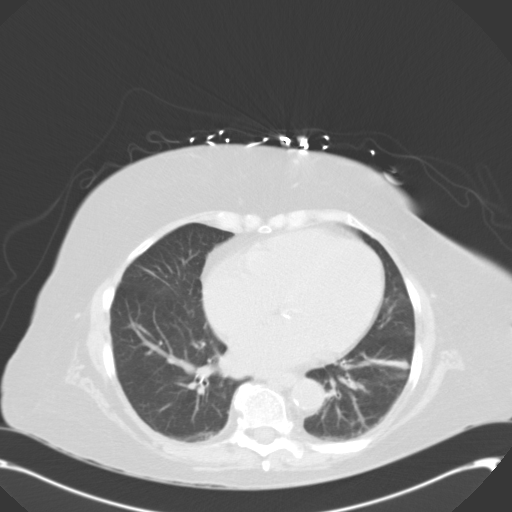
[im 30/55  lung]
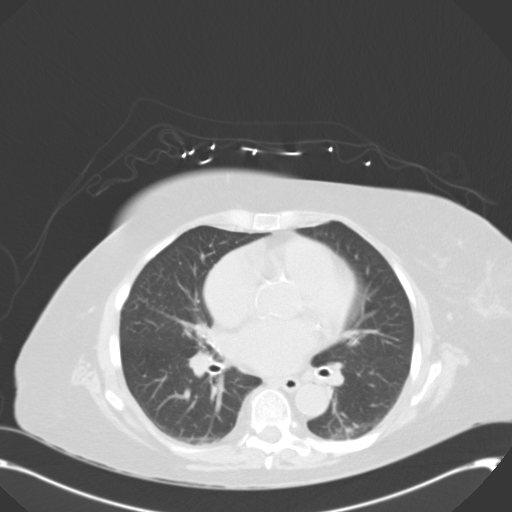
[im 34/55  lung]
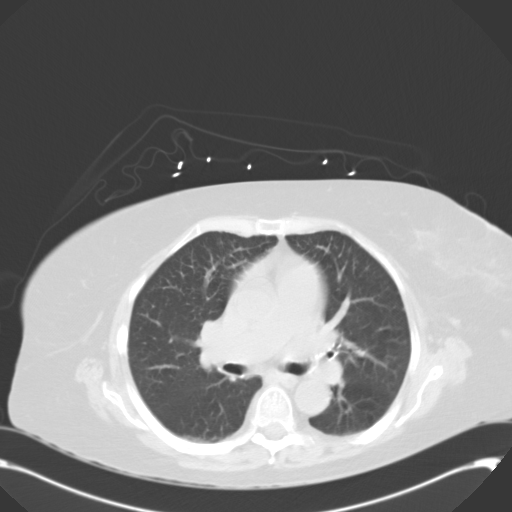
[im 38/55  mediastinal]
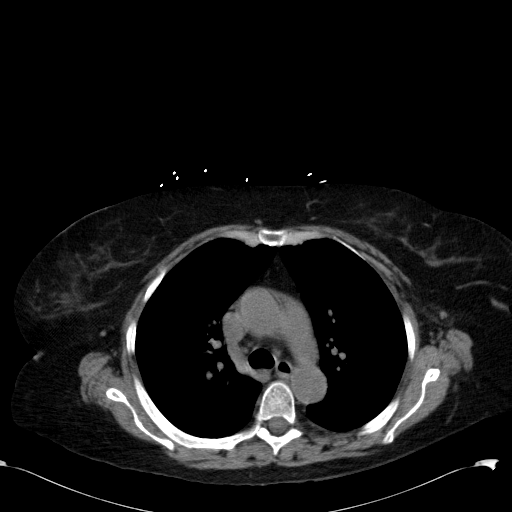
[im 38/55  lung]
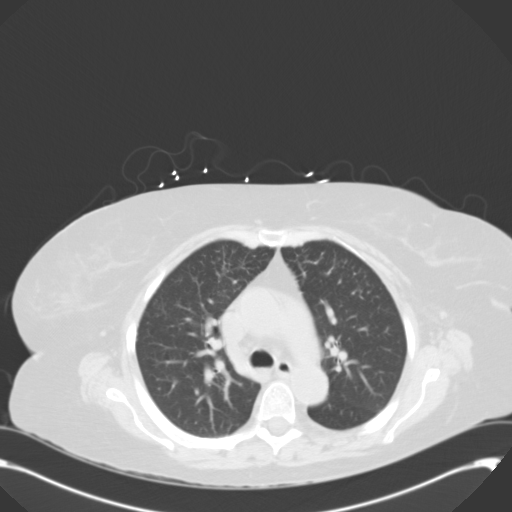
[im 42/55  lung]
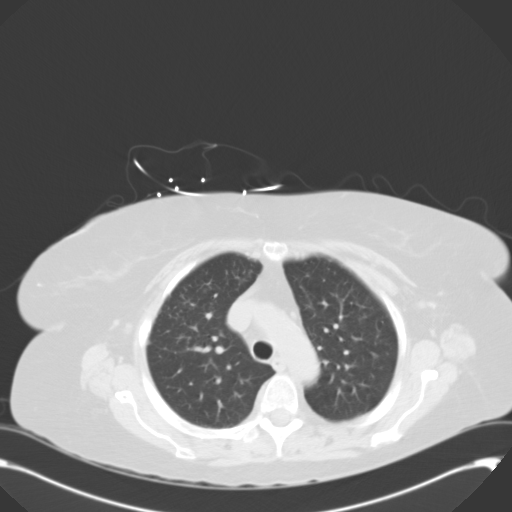
[im 46/55  lung]
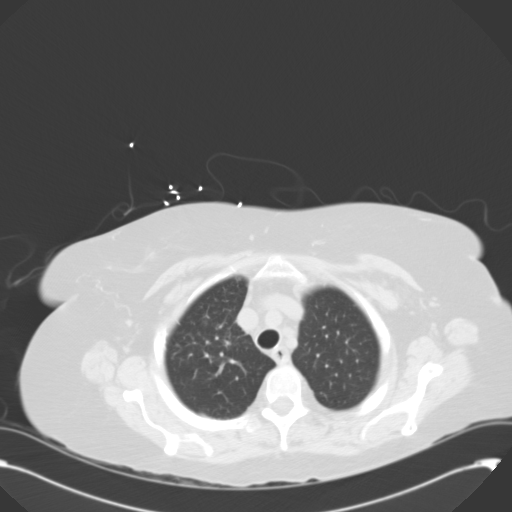
[im 50/55  lung]
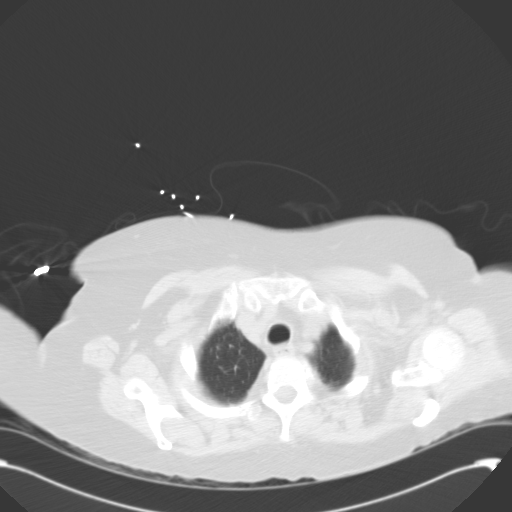

[Series 602: coronal · coronal · 0.75mm/px · 3 of 65 slices shown]
[im 13/65  lung]
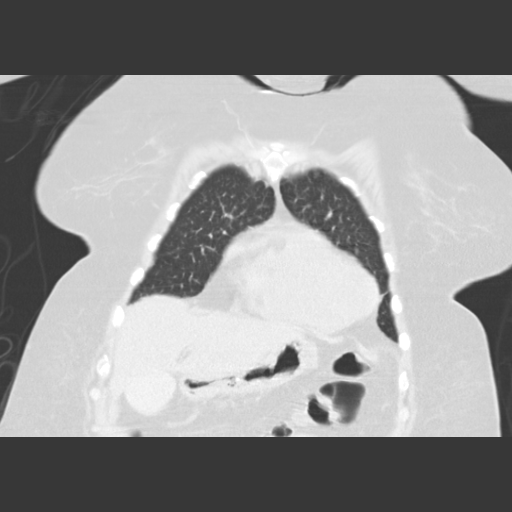
[im 26/65  lung]
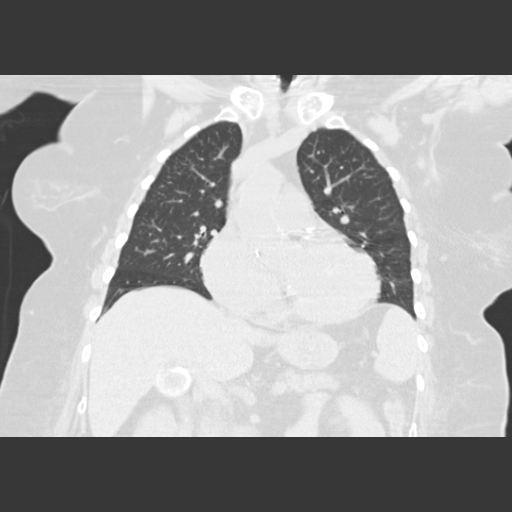
[im 39/65  lung]
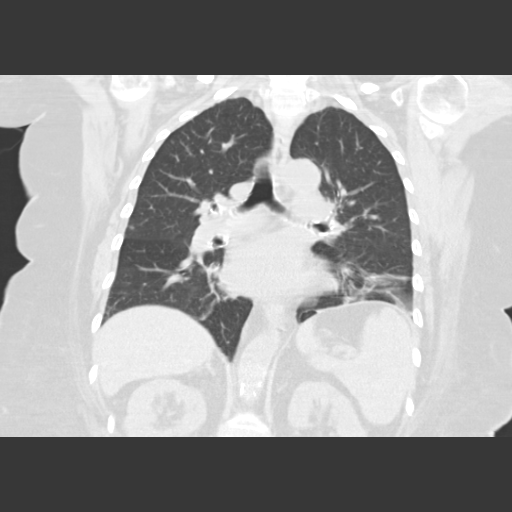

[15 of 36 positions shown; findings below may reference images not displayed]

FINDINGS: Lungs are well aerated bilaterally. Some patchy changes are noted in
the left lower lobe new from the prior exam consistent with acute
infiltrate. A few small subpleural nodules are noted stable from the
previous exam.

The thoracic inlet is within normal limits. No hilar or mediastinal
adenopathy is noted. Mild aortic calcifications are seen as well as
coronary calcifications stable from the previous exam. The
visualized upper abdomen demonstrates a large central gallstone
measuring approximately 4 cm in greatest dimension. No other focal
abnormality is seen. Changes of prior vertebral augmentation are
noted at T11 and T12.
IMPRESSION: New left lower lobe mild infiltrate.

Stable subpleural nodules. If the patient is at high risk for
bronchogenic carcinoma, follow-up chest CT at 1 year is recommended.
If the patient is at low risk, no follow-up is needed. This
recommendation follows the consensus statement: Guidelines for
Management of Small Pulmonary Nodules Detected on CT Scans: A
Statement from the [HOSPITAL] as published in Radiology
5335; [DATE].

No other focal abnormality is noted.

## 2017-10-17 DIAGNOSIS — M150 Primary generalized (osteo)arthritis: Secondary | ICD-10-CM | POA: Diagnosis not present

## 2017-10-17 DIAGNOSIS — M1A00X0 Idiopathic chronic gout, unspecified site, without tophus (tophi): Secondary | ICD-10-CM | POA: Diagnosis not present

## 2017-10-17 DIAGNOSIS — M3390 Dermatopolymyositis, unspecified, organ involvement unspecified: Secondary | ICD-10-CM | POA: Diagnosis not present

## 2017-10-17 DIAGNOSIS — Z79899 Other long term (current) drug therapy: Secondary | ICD-10-CM | POA: Diagnosis not present

## 2017-10-31 DIAGNOSIS — H01001 Unspecified blepharitis right upper eyelid: Secondary | ICD-10-CM | POA: Diagnosis not present

## 2017-10-31 DIAGNOSIS — H53002 Unspecified amblyopia, left eye: Secondary | ICD-10-CM | POA: Diagnosis not present

## 2017-10-31 DIAGNOSIS — H04123 Dry eye syndrome of bilateral lacrimal glands: Secondary | ICD-10-CM | POA: Diagnosis not present

## 2017-10-31 DIAGNOSIS — H01004 Unspecified blepharitis left upper eyelid: Secondary | ICD-10-CM | POA: Diagnosis not present

## 2017-10-31 DIAGNOSIS — Z79899 Other long term (current) drug therapy: Secondary | ICD-10-CM | POA: Diagnosis not present

## 2017-11-07 DIAGNOSIS — H02132 Senile ectropion of right lower eyelid: Secondary | ICD-10-CM | POA: Diagnosis not present

## 2017-11-07 DIAGNOSIS — H43813 Vitreous degeneration, bilateral: Secondary | ICD-10-CM | POA: Diagnosis not present

## 2017-11-07 DIAGNOSIS — H35372 Puckering of macula, left eye: Secondary | ICD-10-CM | POA: Diagnosis not present

## 2017-11-07 DIAGNOSIS — Z79899 Other long term (current) drug therapy: Secondary | ICD-10-CM | POA: Diagnosis not present

## 2017-11-22 ENCOUNTER — Other Ambulatory Visit: Payer: Self-pay | Admitting: Pulmonary Disease

## 2017-11-27 DIAGNOSIS — H01004 Unspecified blepharitis left upper eyelid: Secondary | ICD-10-CM | POA: Diagnosis not present

## 2017-11-27 DIAGNOSIS — Z79899 Other long term (current) drug therapy: Secondary | ICD-10-CM | POA: Diagnosis not present

## 2017-11-27 DIAGNOSIS — H53002 Unspecified amblyopia, left eye: Secondary | ICD-10-CM | POA: Diagnosis not present

## 2017-11-27 DIAGNOSIS — H01001 Unspecified blepharitis right upper eyelid: Secondary | ICD-10-CM | POA: Diagnosis not present

## 2017-11-29 ENCOUNTER — Other Ambulatory Visit: Payer: Self-pay | Admitting: Pulmonary Disease

## 2017-12-12 ENCOUNTER — Telehealth: Payer: Self-pay | Admitting: Pulmonary Disease

## 2017-12-12 NOTE — Telephone Encounter (Signed)
Attempted to call patient today regarding rx for singulair. I did not receive an answer at time of call. I have left a voicemail message for pt to return call. X1  AVS from last OV from Dr. Vaughan Browner from 09/08/17 is Instructions   We will get a chest x-ray today Start you on Singulair Follow-up in 6 months.

## 2017-12-13 NOTE — Telephone Encounter (Signed)
Called and spoke to pt.  Pt had questions regarding Singulair Rx. Pt stated she received a call from Pharmacy stating that Rx was ready for pick up.  Pt stated she has not used this medication, as she was advised by Dr. Vaughan Browner to use it for one month and if sx improved then stop medication.  Per our records, refill for Singulair was requesting on [facility]/17 [facility].  Pt stated [facility] made [facility] aware that Rx is not needed.  Nothing further is needed.   Will route to Dr. Vaughan Browner as a FYI.

## 2018-01-16 ENCOUNTER — Other Ambulatory Visit: Payer: Self-pay | Admitting: Internal Medicine

## 2018-01-17 DIAGNOSIS — M549 Dorsalgia, unspecified: Secondary | ICD-10-CM | POA: Diagnosis not present

## 2018-01-17 DIAGNOSIS — Z7952 Long term (current) use of systemic steroids: Secondary | ICD-10-CM | POA: Diagnosis not present

## 2018-01-17 DIAGNOSIS — N390 Urinary tract infection, site not specified: Secondary | ICD-10-CM | POA: Diagnosis not present

## 2018-01-17 DIAGNOSIS — M150 Primary generalized (osteo)arthritis: Secondary | ICD-10-CM | POA: Diagnosis not present

## 2018-01-17 DIAGNOSIS — Z79899 Other long term (current) drug therapy: Secondary | ICD-10-CM | POA: Diagnosis not present

## 2018-01-17 DIAGNOSIS — M1A00X0 Idiopathic chronic gout, unspecified site, without tophus (tophi): Secondary | ICD-10-CM | POA: Diagnosis not present

## 2018-01-17 DIAGNOSIS — M3390 Dermatopolymyositis, unspecified, organ involvement unspecified: Secondary | ICD-10-CM | POA: Diagnosis not present

## 2018-01-22 DIAGNOSIS — H01001 Unspecified blepharitis right upper eyelid: Secondary | ICD-10-CM | POA: Diagnosis not present

## 2018-01-22 DIAGNOSIS — H04123 Dry eye syndrome of bilateral lacrimal glands: Secondary | ICD-10-CM | POA: Diagnosis not present

## 2018-01-22 DIAGNOSIS — H01004 Unspecified blepharitis left upper eyelid: Secondary | ICD-10-CM | POA: Diagnosis not present

## 2018-01-22 DIAGNOSIS — H53002 Unspecified amblyopia, left eye: Secondary | ICD-10-CM | POA: Diagnosis not present

## 2018-02-07 DIAGNOSIS — Z79899 Other long term (current) drug therapy: Secondary | ICD-10-CM | POA: Diagnosis not present

## 2018-02-19 DIAGNOSIS — M160 Bilateral primary osteoarthritis of hip: Secondary | ICD-10-CM | POA: Diagnosis not present

## 2018-02-19 DIAGNOSIS — Z23 Encounter for immunization: Secondary | ICD-10-CM | POA: Diagnosis not present

## 2018-02-19 DIAGNOSIS — M810 Age-related osteoporosis without current pathological fracture: Secondary | ICD-10-CM | POA: Diagnosis not present

## 2018-02-19 DIAGNOSIS — M5126 Other intervertebral disc displacement, lumbar region: Secondary | ICD-10-CM | POA: Diagnosis not present

## 2018-02-19 DIAGNOSIS — I10 Essential (primary) hypertension: Secondary | ICD-10-CM | POA: Diagnosis not present

## 2018-02-19 DIAGNOSIS — M25559 Pain in unspecified hip: Secondary | ICD-10-CM | POA: Diagnosis not present

## 2018-02-19 DIAGNOSIS — M549 Dorsalgia, unspecified: Secondary | ICD-10-CM | POA: Diagnosis not present

## 2018-02-19 DIAGNOSIS — N390 Urinary tract infection, site not specified: Secondary | ICD-10-CM | POA: Diagnosis not present

## 2018-02-19 DIAGNOSIS — M48061 Spinal stenosis, lumbar region without neurogenic claudication: Secondary | ICD-10-CM | POA: Diagnosis not present

## 2018-02-20 DIAGNOSIS — E7800 Pure hypercholesterolemia, unspecified: Secondary | ICD-10-CM | POA: Diagnosis not present

## 2018-02-27 DIAGNOSIS — K219 Gastro-esophageal reflux disease without esophagitis: Secondary | ICD-10-CM | POA: Diagnosis not present

## 2018-02-27 DIAGNOSIS — I2510 Atherosclerotic heart disease of native coronary artery without angina pectoris: Secondary | ICD-10-CM | POA: Diagnosis not present

## 2018-02-27 DIAGNOSIS — I10 Essential (primary) hypertension: Secondary | ICD-10-CM | POA: Diagnosis not present

## 2018-02-27 DIAGNOSIS — N390 Urinary tract infection, site not specified: Secondary | ICD-10-CM | POA: Diagnosis not present

## 2018-02-27 DIAGNOSIS — E785 Hyperlipidemia, unspecified: Secondary | ICD-10-CM | POA: Diagnosis not present

## 2018-03-06 ENCOUNTER — Other Ambulatory Visit: Payer: Self-pay | Admitting: Pulmonary Disease

## 2018-04-02 ENCOUNTER — Other Ambulatory Visit: Payer: Self-pay | Admitting: Internal Medicine

## 2018-04-25 NOTE — Progress Notes (Signed)
Electrophysiology Office Note Date: 04/28/2018  ID:  [name], DOB 21-Mar-1946, MRN [id number]  PCP: Jani Gravel, MD Electrophysiologist: Rayann Heman  CC: AF follow up  [name] is a 72 y.o. female seen today for Dr Rayann Heman.  She presents today for routine electrophysiology followup.  Since last being seen in our clinic, the patient reports doing well from a cardiac standpoint with no recurrent AF.  Her main issue is with back pain.  She denies chest pain, palpitations, dyspnea, PND, orthopnea, nausea, vomiting, dizziness, syncope, edema, weight gain, or early satiety.  Past Medical History:  Diagnosis Date  . Allergy   . Anemia   . Bilateral leg edema   . C. difficile enteritis   . CAD (coronary artery disease)    a. LHC 03/2006: EF 65%, mid LAD 40-50%, ostial D1 70-80%, normal circumflex, normal RCA;  b. Lexiscan Myoview 09/2010: No ischemia or scar, EF 75%;  c. Lex MV 5/14:  Normal, no ischemia, EF 71%  . Cataract    bilateral-had surgery  . Cellulitis of right leg    Hx - resolved  . Chronic diastolic CHF (congestive heart failure) ([place])    a. Echo 8/14:  Mild LVH, EF 55-65%, normal wall motion, Tr AI, mild MR, mod TR, PASP 45;  b. 12/2014 Echo: EF 60-65%, nl wall motion, mildly dil RA/LA, PASP 9mmHg.  Marland Kitchen Chronic diastolic heart failure ([place])   . Continuous urine leakage   . Dermatomyositis ([place])   . Deviated septum   . Essential hypertension   . Gall stones    a. 08/2014 Abd U/S:  Large gallstone measuring 4.2 cm.  Marland Kitchen GERD (gastroesophageal reflux disease)   . Gout   . Hiatal hernia   . History of pancreatitis   . History of PFTs    a. PFTs 10/2012: normal.   . Hyperlipidemia   . Hypothyroidism   . Internal hemorrhoids   . Morbid obesity ([place])   . Osteoarthritis    spine hips knees  . PAF (paroxysmal atrial fibrillation) ([facility])    a. CHA2DS2VASc = 5-->poor OAC candidate 2/2 falls. No problems now per patient 03/2016  . Pancreatitis   . Pneumonia   . Seizures  ([place])    as a baby  . Shingles    Hx  . Sleep apnea    Does not use CPAP  . Spinal cord compression ([place]) 02/21/2014   Past Surgical History:  Procedure Laterality Date  . CATARACT EXTRACTION, BILATERAL Bilateral   . COLONOSCOPY  03/2011  . NASAL SEPTUM SURGERY    . TOTAL ABDOMINAL HYSTERECTOMY    . VERTEBROPLASTY  Sept 7 and May 30, 2014   x 2, T11/T12  . WISDOM TOOTH EXTRACTION      Current Outpatient Medications  Medication Sig Dispense Refill  . acetaminophen (TYLENOL) 500 MG tablet Take 500-1,000 mg by mouth every 6 (six) hours as needed for mild pain or fever.     Marland Kitchen alendronate (FOSAMAX) 70 MG tablet Take 1 tablet by mouth every Tuesday.     Marland Kitchen allopurinol (ZYLOPRIM) 100 MG tablet Take 100 mg by mouth 2 (two) times daily.     Marland Kitchen aspirin EC 325 MG tablet Take 325 mg by mouth daily.    Marland Kitchen CALCIUM PO Take 500 mg by mouth daily.    . cetirizine (ZYRTEC) 10 MG tablet Take 10 mg by mouth daily.    . Cholecalciferol (VITAMIN D3) 2000 UNITS TABS Take 2,000 Units by mouth  daily.     Marland Kitchen FOLIC ACID PO Take 1 tablet by mouth 4 (four) times daily.     . furosemide (LASIX) 40 MG tablet Take 1 tablet (40 mg total) by mouth 2 (two) times daily. 120 tablet 5  . hydroxychloroquine (PLAQUENIL) 200 MG tablet Take 200 mg by mouth 2 (two) times daily.     Marland Kitchen levothyroxine (SYNTHROID, LEVOTHROID) 100 MCG tablet Take 100 mcg by mouth daily.    . methotrexate (RHEUMATREX) 2.5 MG tablet Take 5 tablets by mouth every Wednesday.   3  . metoprolol succinate (TOPROL-XL) 25 MG 24 hr tablet Take 1 tablet (25 mg total) by mouth daily. Please keep upcoming app in November for future refills. 90 tablet 0  . montelukast (SINGULAIR) 10 MG tablet TAKE 1 TABLET BY MOUTH EVERY DAY 90 tablet 0  . Multiple Vitamin (MULTIVITAMIN) tablet Take 1 tablet by mouth daily.      Marland Kitchen nystatin (MYCOSTATIN/NYSTOP) 100000 UNIT/GM POWD Apply 1 g topically 2 (two) times daily. Applies to stomach    . OVER THE COUNTER MEDICATION Place 1  drop into both eyes daily as needed (for dry eyes. Advanced eye relief).    . pantoprazole (PROTONIX) 40 MG tablet TAKE 1 TABLET BY MOUTH 2  TIMES DAILY BEFORE MEAL 180 tablet 0  . potassium chloride SA (KLOR-CON M20) 20 MEQ tablet Take 1 tablet (20 mEq total) by mouth 3 (three) times daily. 60 tablet 11  . predniSONE (DELTASONE) 5 MG tablet Take 5 mg by mouth daily with breakfast.    . rosuvastatin (CRESTOR) 5 MG tablet Take 5 mg by mouth every other day.      No current facility-administered medications for this visit.     Allergies:   Butoconazole; Lipitor [atorvastatin calcium]; and Keflex [cephalexin]   Social History: Social History   Socioeconomic History  . Marital status: Married    Spouse name: Not on file  . Number of children: 2  . Years of education: Not on file  . Highest education level: Not on file  Occupational History    Employer: UNEMPLOYED  Social Needs  . Financial resource strain: Not on file  . Food insecurity:    Worry: Not on file    Inability: Not on file  . Transportation needs:    Medical: Not on file    Non-medical: Not on file  Tobacco Use  . Smoking status: Never Smoker  . Smokeless tobacco: Never Used  Substance and Sexual Activity  . Alcohol use: No  . Drug use: No  . Sexual activity: Never    Birth control/protection: Post-menopausal  Lifestyle  . Physical activity:    Days per week: Not on file    Minutes per session: Not on file  . Stress: Not on file  Relationships  . Social connections:    Talks on phone: Not on file    Gets together: Not on file    Attends religious service: Not on file    Active member of club or organization: Not on file    Attends meetings of clubs or organizations: Not on file    Relationship status: Not on file  . Intimate partner violence:    Fear of current or ex partner: Not on file    Emotionally abused: Not on file    Physically abused: Not on file    Forced sexual activity: Not on file  Other  Topics Concern  . Not on file  Social History Narrative  .  Not on file    Family History: Family History  Problem Relation Age of Onset  . Colon cancer Mother        mets, spot on lungs and spine  . Coronary artery disease Father   . Emphysema Father   . Heart attack Brother        x 2  . Colon cancer Paternal Uncle   . Kidney disease Brother   . Hypertension Brother   . Stroke Neg Hx   . Esophageal cancer Neg Hx     Review of Systems: All other systems reviewed and are otherwise negative except as noted above.   Physical Exam: VS:  BP 116/78   Pulse (!) 57   Ht 4\' 9"  (1.448 m)   Wt 181 lb 6.4 oz (82.3 kg)   SpO2 99%   BMI 39.25 kg/m  , BMI Body mass index is 39.25 kg/m. Wt Readings from Last 3 Encounters:  04/26/18 181 lb 6.4 oz (82.3 kg)  09/08/17 165 lb (74.8 kg)  08/21/17 164 lb (74.4 kg)    GEN- The patient is morbidly obese appearing, alert and oriented x 3 today.   HEENT: normocephalic, atraumatic; sclera clear, conjunctiva pink; hearing intact; oropharynx clear; neck supple Lungs- Clear to ausculation bilaterally, normal work of breathing.  No wheezes, rales, rhonchi Heart- Regular rate and rhythm GI- soft, non-tender, non-distended, bowel sounds present Extremities- no clubbing, cyanosis, or edema MS- no significant deformity or atrophy Skin- warm and dry, no rash or lesion  Psych- euthymic mood, full affect Neuro- strength and sensation are intact   EKG:  EKG is ordered today. The ekg ordered today shows sinus bradycardia, rate 57  Recent Labs: No results found for requested labs within last 8760 hours.    Other studies Reviewed: Additional studies/ records that were reviewed today include: Dr Jackalyn Lombard office notes  Assessment and Plan:  1.  Persistent atrial fibrillation Maintaining SR off AAD therapy (amio previously stopped 2/2 tremors) If recurrent AF, consider Sotalol No OAC 2/2 falls - CHADS2VASC is 4  2.  CAD Stable No change  required today  3.  Chronic diastolic heart failure Stable No change required today  4.  Morbid obesity Body mass index is 39.25 kg/m. Weight loss encouraged    Current medicines are reviewed at length with the patient today.   The patient does not have concerns regarding her medicines.  The following changes were made today:  none  Labs/ tests ordered today include: none Orders Placed This Encounter  Procedures  . EKG 12-Lead     Disposition:   Follow up with me in 9 months     Signed, Chanetta Marshall, NP 04/28/2018 3:58 PM   [place] [place] [address] [phone] (office) [phone] (fax)

## 2018-04-26 ENCOUNTER — Ambulatory Visit: Payer: Medicare Other | Admitting: Nurse Practitioner

## 2018-04-26 ENCOUNTER — Encounter: Payer: Self-pay | Admitting: Nurse Practitioner

## 2018-04-26 VITALS — BP 116/78 | HR 57 | Ht <= 58 in | Wt 181.4 lb

## 2018-04-26 DIAGNOSIS — I2510 Atherosclerotic heart disease of native coronary artery without angina pectoris: Secondary | ICD-10-CM | POA: Diagnosis not present

## 2018-04-26 DIAGNOSIS — I5032 Chronic diastolic (congestive) heart failure: Secondary | ICD-10-CM | POA: Diagnosis not present

## 2018-04-26 DIAGNOSIS — I4819 Other persistent atrial fibrillation: Secondary | ICD-10-CM | POA: Diagnosis not present

## 2018-04-26 NOTE — Patient Instructions (Signed)
Medication Instructions:  NONE ORDERED  TODAY  If you need a refill on your cardiac medications before your next appointment, please call your pharmacy.   Lab work: NONE ORDERED  TODAY' If you have labs (blood work) drawn today and your tests are completely normal, you will receive your results only by: Marland Kitchen MyChart Message (if you have MyChart) OR . A paper copy in the mail If you have any lab test that is abnormal or we need to change your treatment, we will call you to review the results.  Testing/Procedures: NONE ORDERED  TODAY   Follow-Up:  Your physician wants you to follow-up in:  IN [place] will receive a reminder letter in the mail two months in advance. If you don't receive a letter, please call our office to schedule the follow-up appointment.     Any Other Special Instructions Will Be Listed Below (If Applicable).

## 2018-05-08 ENCOUNTER — Other Ambulatory Visit: Payer: Self-pay

## 2018-05-08 NOTE — Patient Outreach (Signed)
[facility]) Care Management  05/08/2018  [name] 02-01-1946 [id number]   Medication Adherence call to [name] spoke with patient she already order Rosuvastatin 5 mg and is expecting a deliver from [facility] any time soon patient was a bit hesitant on providing her information. [name] is showing past due under [place].   [facility] Management Direct Dial [phone]  Fax [phone] [name]

## 2018-05-15 DIAGNOSIS — H35372 Puckering of macula, left eye: Secondary | ICD-10-CM | POA: Diagnosis not present

## 2018-05-15 DIAGNOSIS — H43813 Vitreous degeneration, bilateral: Secondary | ICD-10-CM | POA: Diagnosis not present

## 2018-05-15 DIAGNOSIS — M3310 Other dermatopolymyositis, organ involvement unspecified: Secondary | ICD-10-CM | POA: Diagnosis not present

## 2018-05-15 DIAGNOSIS — H35383 Toxic maculopathy, bilateral: Secondary | ICD-10-CM | POA: Diagnosis not present

## 2018-05-29 DIAGNOSIS — M5136 Other intervertebral disc degeneration, lumbar region: Secondary | ICD-10-CM | POA: Insufficient documentation

## 2018-05-29 DIAGNOSIS — M5134 Other intervertebral disc degeneration, thoracic region: Secondary | ICD-10-CM | POA: Diagnosis not present

## 2018-06-12 DIAGNOSIS — R300 Dysuria: Secondary | ICD-10-CM | POA: Diagnosis not present

## 2018-06-12 DIAGNOSIS — N390 Urinary tract infection, site not specified: Secondary | ICD-10-CM | POA: Diagnosis not present

## 2018-06-12 DIAGNOSIS — M5134 Other intervertebral disc degeneration, thoracic region: Secondary | ICD-10-CM | POA: Diagnosis not present

## 2018-06-12 DIAGNOSIS — R319 Hematuria, unspecified: Secondary | ICD-10-CM | POA: Diagnosis not present

## 2018-06-15 DIAGNOSIS — H35383 Toxic maculopathy, bilateral: Secondary | ICD-10-CM | POA: Diagnosis not present

## 2018-06-15 DIAGNOSIS — Z79899 Other long term (current) drug therapy: Secondary | ICD-10-CM | POA: Diagnosis not present

## 2018-06-20 DIAGNOSIS — M5134 Other intervertebral disc degeneration, thoracic region: Secondary | ICD-10-CM | POA: Diagnosis not present

## 2018-07-05 DIAGNOSIS — M5134 Other intervertebral disc degeneration, thoracic region: Secondary | ICD-10-CM | POA: Diagnosis not present

## 2018-07-05 DIAGNOSIS — M546 Pain in thoracic spine: Secondary | ICD-10-CM | POA: Diagnosis not present

## 2018-07-05 DIAGNOSIS — M48061 Spinal stenosis, lumbar region without neurogenic claudication: Secondary | ICD-10-CM | POA: Diagnosis not present

## 2018-07-19 DIAGNOSIS — Z79899 Other long term (current) drug therapy: Secondary | ICD-10-CM | POA: Diagnosis not present

## 2018-07-19 DIAGNOSIS — M150 Primary generalized (osteo)arthritis: Secondary | ICD-10-CM | POA: Diagnosis not present

## 2018-07-19 DIAGNOSIS — M1A00X0 Idiopathic chronic gout, unspecified site, without tophus (tophi): Secondary | ICD-10-CM | POA: Diagnosis not present

## 2018-07-19 DIAGNOSIS — M3390 Dermatopolymyositis, unspecified, organ involvement unspecified: Secondary | ICD-10-CM | POA: Diagnosis not present

## 2018-07-19 DIAGNOSIS — M549 Dorsalgia, unspecified: Secondary | ICD-10-CM | POA: Diagnosis not present

## 2018-07-24 DIAGNOSIS — M48061 Spinal stenosis, lumbar region without neurogenic claudication: Secondary | ICD-10-CM | POA: Diagnosis not present

## 2018-07-25 ENCOUNTER — Emergency Department (HOSPITAL_COMMUNITY): Payer: Medicare Other

## 2018-07-25 ENCOUNTER — Observation Stay (HOSPITAL_COMMUNITY)
Admission: EM | Admit: 2018-07-25 | Discharge: 2018-07-26 | Disposition: A | Discharge status: Home / Self Care | Payer: Medicare Other | Attending: Family Medicine | Admitting: Family Medicine

## 2018-07-25 ENCOUNTER — Encounter (HOSPITAL_COMMUNITY): Payer: Self-pay | Admitting: Internal Medicine

## 2018-07-25 ENCOUNTER — Other Ambulatory Visit: Payer: Self-pay

## 2018-07-25 DIAGNOSIS — Z6841 Body Mass Index (BMI) 40.0 and over, adult: Secondary | ICD-10-CM | POA: Diagnosis not present

## 2018-07-25 DIAGNOSIS — R000 Tachycardia, unspecified: Secondary | ICD-10-CM | POA: Diagnosis not present

## 2018-07-25 DIAGNOSIS — I5032 Chronic diastolic (congestive) heart failure: Secondary | ICD-10-CM | POA: Diagnosis not present

## 2018-07-25 DIAGNOSIS — D696 Thrombocytopenia, unspecified: Secondary | ICD-10-CM | POA: Diagnosis not present

## 2018-07-25 DIAGNOSIS — E039 Hypothyroidism, unspecified: Secondary | ICD-10-CM | POA: Diagnosis not present

## 2018-07-25 DIAGNOSIS — I10 Essential (primary) hypertension: Secondary | ICD-10-CM | POA: Diagnosis present

## 2018-07-25 DIAGNOSIS — R072 Precordial pain: Secondary | ICD-10-CM | POA: Diagnosis not present

## 2018-07-25 DIAGNOSIS — I4891 Unspecified atrial fibrillation: Secondary | ICD-10-CM

## 2018-07-25 DIAGNOSIS — R51 Headache: Secondary | ICD-10-CM | POA: Diagnosis not present

## 2018-07-25 DIAGNOSIS — I2510 Atherosclerotic heart disease of native coronary artery without angina pectoris: Secondary | ICD-10-CM | POA: Insufficient documentation

## 2018-07-25 DIAGNOSIS — R42 Dizziness and giddiness: Secondary | ICD-10-CM | POA: Diagnosis not present

## 2018-07-25 DIAGNOSIS — I480 Paroxysmal atrial fibrillation: Secondary | ICD-10-CM

## 2018-07-25 DIAGNOSIS — Z79899 Other long term (current) drug therapy: Secondary | ICD-10-CM | POA: Insufficient documentation

## 2018-07-25 DIAGNOSIS — Z7982 Long term (current) use of aspirin: Secondary | ICD-10-CM | POA: Diagnosis not present

## 2018-07-25 DIAGNOSIS — I110 Hypertensive heart disease with heart failure: Secondary | ICD-10-CM | POA: Diagnosis not present

## 2018-07-25 DIAGNOSIS — R079 Chest pain, unspecified: Secondary | ICD-10-CM | POA: Diagnosis not present

## 2018-07-25 HISTORY — DX: Family history of other specified conditions: Z84.89

## 2018-07-25 LAB — BASIC METABOLIC PANEL
Anion gap: 11 (ref 5–15)
BUN: 17 mg/dL (ref 8–23)
CO2: 22 mmol/L (ref 22–32)
Calcium: 9.6 mg/dL (ref 8.9–10.3)
Chloride: 111 mmol/L (ref 98–111)
Creatinine, Ser: 0.87 mg/dL (ref 0.44–1.00)
GFR calc Af Amer: >60 mL/min (ref >60)
GFR calc non Af Amer: >60 mL/min (ref >60)
Glucose, Bld: 146 mg/dL — ABNORMAL HIGH (ref 70–99)
Potassium: 4.3 mmol/L (ref 3.5–5.1)
Sodium: 144 mmol/L (ref 135–145)

## 2018-07-25 LAB — I-STAT TROPONIN, ED: Troponin i, poc: 0.05 ng/mL (ref 0.00–0.08)

## 2018-07-25 LAB — CBC
HCT: 43.9 % (ref 36.0–46.0)
Hemoglobin: 14.3 g/dL (ref 12.0–15.0)
MCH: 34.8 pg — ABNORMAL HIGH (ref 26.0–34.0)
MCHC: 32.6 g/dL (ref 30.0–36.0)
MCV: 106.8 fL — ABNORMAL HIGH (ref 80.0–100.0)
Platelets: 136 10*3/uL — ABNORMAL LOW (ref 150–400)
RBC: 4.11 MIL/uL (ref 3.87–5.11)
RDW: 13.8 % (ref 11.5–15.5)
WBC: 6.9 10*3/uL (ref 4.0–10.5)
nRBC: 0 % (ref 0.0–0.2)

## 2018-07-25 LAB — MAGNESIUM: Magnesium: 2.2 mg/dL (ref 1.7–2.4)

## 2018-07-25 LAB — TROPONIN I
Troponin I: 0.09 ng/mL (ref <0.03)
Troponin I: 0.12 ng/mL (ref <0.03)

## 2018-07-25 MED ORDER — ENOXAPARIN SODIUM 40 MG/0.4ML ~~LOC~~ SOLN
40.0000 mg | SUBCUTANEOUS | Status: DC
  Administered 2018-07-25: 40 mg via SUBCUTANEOUS
  Filled 2018-07-25 (×3): qty 0.4

## 2018-07-25 MED ORDER — LIDOCAINE HCL 2 % IJ SOLN
INTRAMUSCULAR | Status: AC
  Filled 2018-07-25: qty 20

## 2018-07-25 MED ORDER — METOPROLOL SUCCINATE ER 25 MG PO TB24
25.0000 mg | ORAL_TABLET | Freq: Every day | ORAL | Status: DC
  Administered 2018-07-25 – 2018-07-26 (×2): 25 mg via ORAL
  Filled 2018-07-25 (×2): qty 1

## 2018-07-25 MED ORDER — LORATADINE 10 MG PO TABS
10.0000 mg | ORAL_TABLET | Freq: Every day | ORAL | Status: DC
  Administered 2018-07-25 – 2018-07-26 (×2): 10 mg via ORAL
  Filled 2018-07-25 (×2): qty 1

## 2018-07-25 MED ORDER — ALLOPURINOL 100 MG PO TABS
100.0000 mg | ORAL_TABLET | Freq: Two times a day (BID) | ORAL | Status: DC
  Administered 2018-07-25 – 2018-07-26 (×3): 100 mg via ORAL
  Filled 2018-07-25 (×3): qty 1

## 2018-07-25 MED ORDER — ROSUVASTATIN CALCIUM 5 MG PO TABS
5.0000 mg | ORAL_TABLET | ORAL | Status: DC
  Administered 2018-07-26: 5 mg via ORAL
  Filled 2018-07-25: qty 1

## 2018-07-25 MED ORDER — METOPROLOL TARTRATE 5 MG/5ML IV SOLN
2.5000 mg | Freq: Once | INTRAVENOUS | Status: AC
  Administered 2018-07-25: 2.5 mg via INTRAVENOUS
  Filled 2018-07-25: qty 5

## 2018-07-25 MED ORDER — METOPROLOL TARTRATE 5 MG/5ML IV SOLN: 5.0000 mg | Freq: Once | INTRAVENOUS | Status: DC

## 2018-07-25 MED ORDER — SODIUM CHLORIDE 0.9 % IV SOLN
Freq: Once | INTRAVENOUS | Status: AC
  Administered 2018-07-25: 06:00:00 via INTRAVENOUS

## 2018-07-25 MED ORDER — LIDOCAINE-EPINEPHRINE 2 %-1:100000 IJ SOLN
INTRAMUSCULAR | Status: AC
  Filled 2018-07-25: qty 1

## 2018-07-25 MED ORDER — MONTELUKAST SODIUM 10 MG PO TABS
10.0000 mg | ORAL_TABLET | Freq: Every day | ORAL | Status: DC
  Administered 2018-07-25: 10 mg via ORAL
  Filled 2018-07-25: qty 1

## 2018-07-25 MED ORDER — METOPROLOL TARTRATE 5 MG/5ML IV SOLN: 2.5000 mg | INTRAVENOUS | Status: DC | PRN

## 2018-07-25 MED ORDER — LEVOTHYROXINE SODIUM 100 MCG PO TABS
100.0000 ug | ORAL_TABLET | Freq: Every day | ORAL | Status: DC
  Administered 2018-07-25 – 2018-07-26 (×2): 100 ug via ORAL
  Filled 2018-07-25 (×3): qty 1

## 2018-07-25 MED ORDER — PANTOPRAZOLE SODIUM 40 MG PO TBEC
40.0000 mg | DELAYED_RELEASE_TABLET | Freq: Two times a day (BID) | ORAL | Status: DC
  Administered 2018-07-25 – 2018-07-26 (×3): 40 mg via ORAL
  Filled 2018-07-25 (×3): qty 1

## 2018-07-25 MED ORDER — ASPIRIN EC 325 MG PO TBEC
325.0000 mg | DELAYED_RELEASE_TABLET | Freq: Every day | ORAL | Status: DC
  Administered 2018-07-26: 325 mg via ORAL
  Filled 2018-07-25: qty 1

## 2018-07-25 MED ORDER — ACETAMINOPHEN 325 MG PO TABS: 650.0000 mg | ORAL_TABLET | ORAL | Status: DC | PRN

## 2018-07-25 MED ORDER — TRAMADOL HCL 50 MG PO TABS
50.0000 mg | ORAL_TABLET | Freq: Two times a day (BID) | ORAL | Status: DC | PRN
  Administered 2018-07-25: 50 mg via ORAL
  Filled 2018-07-25: qty 1

## 2018-07-25 MED ORDER — ONDANSETRON HCL 4 MG/2ML IJ SOLN: 4.0000 mg | Freq: Four times a day (QID) | INTRAMUSCULAR | Status: DC | PRN

## 2018-07-25 MED ORDER — METHOTREXATE 2.5 MG PO TABS
10.0000 mg | ORAL_TABLET | ORAL | Status: DC
  Administered 2018-07-25: 10 mg via ORAL
  Filled 2018-07-25: qty 4

## 2018-07-25 MED ORDER — PREDNISONE 5 MG PO TABS
5.0000 mg | ORAL_TABLET | Freq: Every day | ORAL | Status: DC
  Administered 2018-07-25 – 2018-07-26 (×2): 5 mg via ORAL
  Filled 2018-07-25 (×3): qty 1

## 2018-07-25 MED ORDER — FOLIC ACID 1 MG PO TABS
1.0000 mg | ORAL_TABLET | Freq: Four times a day (QID) | ORAL | Status: DC
  Administered 2018-07-25 – 2018-07-26 (×5): 1 mg via ORAL
  Filled 2018-07-25 (×5): qty 1

## 2018-07-25 NOTE — ED Triage Notes (Addendum)
Per ems pt was at home and started having chest discomfort no n/v. Pt stated it felt uneasy. Pt has headache with some dizziness. Not the worse headache she has ever felt. Alert oriented x 4 no chest pain ems AND DISPATCH GAVE HER 648 OF ASPIRIN.

## 2018-07-25 NOTE — ED Provider Notes (Signed)
[facility] EMERGENCY DEPARTMENT Provider Note   CSN: [id number] Arrival date & time: 07/25/18  0038     History   Chief Complaint Chief Complaint  Patient presents with  . Chest Pain    discomfort    HPI [name] is a 73 y.o. female.  73 y/o female with hx of CAD, chronic dCHF (EF 60-65%), PAF (not on chronic anticoagulation 2/2 fall risk), HTN, HLD, GERD, hypothyroid presents to the emergency department for evaluation of chest discomfort.  She states that she began to notice a discomfort in her central chest around 2200 tonight.  She states that she has had similar feelings with episodes of atrial fibrillation and/or hypertension in the past.  Symptoms have remained constant.  States that she usually experiences symptoms for only a few minutes before they spontaneously resolved.  She felt some dizziness with onset of her discomfort today.  Dizziness characterized as "feeling like I am on an elevator".  Notes some preceding left-sided facial pain and left parietal headache x2 days.  Feels this has been subjectively worse since her chest discomfort began tonight.  Denies any shortness of breath, diaphoresis, clamminess, nausea or vomiting.  Has been compliant with her metoprolol.  Did receive a cortisone injection yesterday for management of chronic back pain.   CHADs2VASC score is 4 Cardiologist - Dr. Rayann Heman  The history is provided by the patient. No language interpreter was used.  Chest Pain    Past Medical History:  Diagnosis Date  . Allergy   . Anemia   . Bilateral leg edema   . C. difficile enteritis   . CAD (coronary artery disease)    a. LHC 03/2006: EF 65%, mid LAD 40-50%, ostial D1 70-80%, normal circumflex, normal RCA;  b. Lexiscan Myoview 09/2010: No ischemia or scar, EF 75%;  c. Lex MV 5/14:  Normal, no ischemia, EF 71%  . Cataract    bilateral-had surgery  . Cellulitis of right leg    Hx - resolved  . Chronic diastolic CHF (congestive heart  failure) ([place])    a. Echo 8/14:  Mild LVH, EF 55-65%, normal wall motion, Tr AI, mild MR, mod TR, PASP 45;  b. 12/2014 Echo: EF 60-65%, nl wall motion, mildly dil RA/LA, PASP 32mmHg.  Marland Kitchen Chronic diastolic heart failure ([place])   . Continuous urine leakage   . Dermatomyositis ([place])   . Deviated septum   . Essential hypertension   . Gall stones    a. 08/2014 Abd U/S:  Large gallstone measuring 4.2 cm.  Marland Kitchen GERD (gastroesophageal reflux disease)   . Gout   . Hiatal hernia   . History of pancreatitis   . History of PFTs    a. PFTs 10/2012: normal.   . Hyperlipidemia   . Hypothyroidism   . Internal hemorrhoids   . Morbid obesity ([place])   . Osteoarthritis    spine hips knees  . PAF (paroxysmal atrial fibrillation) (H[facility])    a. CHA2DS2VASc = 5-->poor OAC candidate 2/2 falls. No problems now per patient 03/2016  . Pancreatitis   . Pneumonia   . Seizures ([place])    as a baby  . Shingles    Hx  . Sleep apnea    Does not use CPAP  . Spinal cord compression ([place]) 02/21/2014    Patient Active Problem List   Diagnosis Date Noted  . Cellulitis, bilateral LE but R>L, abd wall  08/13/2015  . Sepsis due to cellulitis ([place]) 08/13/2015  .  Acute kidney injury ([place]) 08/13/2015  . Hypokalemia 08/13/2015  . History of iron deficiency anemia 08/13/2015  . Thrombocytopenia ([place]) 08/13/2015  . Sepsis ([place]) 08/13/2015  . Chronic diastolic CHF (congestive heart failure) ([place])   . PAF (paroxysmal atrial fibrillation) ([place])   . Essential hypertension   . Chronic anticoagulation 02/18/2014  . Dermatomyositis ([place]) 02/18/2014  . Morbid obesity ([place]) 12/09/2013  . CAD, NATIVE VESSEL 09/25/2008  . LEG EDEMA, BILATERAL 09/20/2008    Past Surgical History:  Procedure Laterality Date  . CATARACT EXTRACTION, BILATERAL Bilateral   . COLONOSCOPY  03/2011  . NASAL SEPTUM SURGERY    . TOTAL ABDOMINAL HYSTERECTOMY    . VERTEBROPLASTY  Sept 7 and May 30, 2014   x 2, T11/T12  . WISDOM TOOTH EXTRACTION       OB  History   No obstetric history on file.      Home Medications    Prior to Admission medications   Medication Sig Start Date End Date Taking? Authorizing Provider  acetaminophen (TYLENOL) 500 MG tablet Take 500-1,000 mg by mouth every 6 (six) hours as needed for mild pain or fever.    Yes [provider]  alendronate (FOSAMAX) 70 MG tablet Take 1 tablet by mouth every Tuesday.  04/01/15  Yes [provider]  allopurinol (ZYLOPRIM) 100 MG tablet Take 100 mg by mouth 2 (two) times daily.  01/14/16  Yes [provider]  aspirin EC 325 MG tablet Take 325 mg by mouth daily.   Yes [provider]  CALCIUM PO Take 500 mg by mouth daily.   Yes [provider]  cetirizine (ZYRTEC) 10 MG tablet Take 10 mg by mouth daily.   Yes [provider]  Cholecalciferol (VITAMIN D3) 2000 UNITS TABS Take 2,000 Units by mouth daily.    Yes [provider]  folic acid (FOLVITE) 1 MG tablet Take 1 mg by mouth 4 (four) times daily.   Yes [provider]  furosemide (LASIX) 40 MG tablet Take 1 tablet (40 mg total) by mouth 2 (two) times daily. 08/18/15  Yes [name], [name], MD  levothyroxine (SYNTHROID, LEVOTHROID) 100 MCG tablet Take 100 mcg by mouth daily. 07/07/17  Yes [provider]  methotrexate (RHEUMATREX) 2.5 MG tablet Take 10 mg by mouth every Wednesday.  09/17/14  Yes [provider]  metoprolol succinate (TOPROL-XL) 25 MG 24 hr tablet Take 1 tablet (25 mg total) by mouth daily. Please keep upcoming app in November for future refills. 04/04/18  Yes Allred, Jeneen Rinks, MD  montelukast (SINGULAIR) 10 MG tablet TAKE 1 TABLET BY MOUTH EVERY DAY Patient taking differently: Take 10 mg by mouth at bedtime.  11/30/17  Yes Mannam, Praveen, MD  Multiple Vitamin (MULTIVITAMIN) tablet Take 1 tablet by mouth daily.     Yes [provider]  nystatin (MYCOSTATIN/NYSTOP) 100000 UNIT/GM POWD Apply 1 g topically 2 (two) times daily.  Applies to stomach   Yes [provider]  OVER THE COUNTER MEDICATION Place 1 drop into both eyes daily as needed (for dry eyes. Advanced eye relief).   Yes [provider]  pantoprazole (PROTONIX) 40 MG tablet TAKE 1 TABLET BY MOUTH 2  TIMES DAILY BEFORE MEAL Patient taking differently: Take 40 mg by mouth 2 (two) times daily.  03/06/18  Yes Mannam, Praveen, MD  potassium chloride SA (KLOR-CON M20) 20 MEQ tablet Take 1 tablet (20 mEq total) by mouth 3 (three) times daily. 08/18/15  Yes [name], [name], MD  predniSONE (DELTASONE) 5 MG tablet Take 5 mg by mouth daily with breakfast.   Yes [provider]  rosuvastatin (CRESTOR) 5 MG tablet Take 5 mg by mouth every other day.    Yes [provider]  traMADol (ULTRAM) 50 MG tablet Take 50 mg by mouth every 12 (twelve) hours as needed for moderate pain.   Yes [provider]    Family History Family History  Problem Relation Age of Onset  . Colon cancer Mother        mets, spot on lungs and spine  . Coronary artery disease Father   . Emphysema Father   . Heart attack Brother        x 2  . Colon cancer Paternal Uncle   . Kidney disease Brother   . Hypertension Brother   . Stroke Neg Hx   . Esophageal cancer Neg Hx     Social History Social History   Tobacco Use  . Smoking status: Never Smoker  . Smokeless tobacco: Never Used  Substance Use Topics  . Alcohol use: No  . Drug use: No     Allergies   Butoconazole; Lipitor [atorvastatin calcium]; and Keflex [cephalexin]   Review of Systems Review of Systems  Cardiovascular: Positive for chest pain.  Ten systems reviewed and are negative for acute change, except as noted in the HPI.     Physical Exam Updated Vital Signs BP (!) 167/98   Pulse 65   Temp 97.8 F (36.6 C) (Oral)   Resp 16   Ht 4\' 8"  (1.422 m)   Wt 83 kg   SpO2 96%   BMI 41.03 kg/m   Physical Exam Vitals signs and nursing note reviewed.  Constitutional:       General: She is not in acute distress.    Appearance: She is well-developed. She is not diaphoretic.     Comments: Nontoxic appearing and in NAD  HENT:     Head: Normocephalic and atraumatic.     Comments: No tenderness over bilateral temporal arteries Eyes:     General: No scleral icterus.    Conjunctiva/sclera: Conjunctivae normal.  Neck:     Musculoskeletal: Normal range of motion.  Cardiovascular:     Comments: Irregularly irregular rhythm. Rate fluctuates between normal and tachycardic. HR 65-116bpm on bedside monitor. Pulmonary:     Effort: Pulmonary effort is normal. No respiratory distress.     Breath sounds: No stridor. No wheezing, rhonchi or rales.     Comments: Lungs CTAB. Respirations even and unlabored. Musculoskeletal: Normal range of motion.     Comments: Nonpitting BLE edema.  Skin:    General: Skin is warm and dry.     Coloration: Skin is not pale.     Findings: No erythema or rash.  Neurological:     Mental Status: She is alert and oriented to person, place, and time.  Psychiatric:        Behavior: Behavior normal.      ED Treatments / Results  Labs (all labs ordered are listed, but only abnormal results are displayed) Labs Reviewed  BASIC METABOLIC PANEL - Abnormal; Notable for the following components:      Result Value   Glucose, Bld 146 (*)    All other components within normal limits  CBC - Abnormal; Notable for the following components:   MCV 106.8 (*)    MCH 34.8 (*)    Platelets 136 (*)    All other components within normal limits  MAGNESIUM  I-STAT TROPONIN, ED    EKG EKG Interpretation  Date/Time:  Wednesday July 25 2018 01:02:37 EST Ventricular Rate:  108 PR Interval:    QRS Duration: 74 QT Interval:  387 QTC Calculation: 519 R Axis:   40 Text Interpretation:  Sinus tachycardia with irregular rate Borderline low voltage, extremity leads Prolonged QT interval Confirmed by Ripley Fraise [id number]) on 07/25/2018 1:11:10  AM   Radiology Dg Chest 2 View  Result Date: 07/25/2018 CLINICAL DATA:  Chest pain EXAM: CHEST - 2 VIEW COMPARISON:  09/08/2017 FINDINGS: Heart is upper limits normal in size. No confluent airspace opacities, effusions or edema. No acute bony abnormality. Prior vertebroplasty in the lower thoracic spine. IMPRESSION: No active cardiopulmonary disease. Electronically Signed   By: Rolm Baptise M.D.   On: 07/25/2018 01:28    Procedures Procedures (including critical care time)  Medications Ordered in ED Medications  metoprolol tartrate (LOPRESSOR) injection 2.5 mg (has no administration in time range)  0.9 %  sodium chloride infusion (has no administration in time range)       Initial Impression / Assessment and Plan / ED Course  I have reviewed the triage vital signs and the nursing notes.  Pertinent labs & imaging results that were available during my care of the patient were reviewed by me and considered in my medical decision making (see chart for details).     58:10 AM 73 year old female with a history of paroxysmal atrial fibrillation presents to the emergency department for nonspecific centralized chest discomfort.  Took her blood pressure at home and reports systolic blood pressure in the 180s to 200s.  Blood pressure has been fluctuating in the ED, persistently hypertensive.  Onset of symptoms was around 2200 tonight.  Given that symptom onset was within 4 hours, will discuss chemical versus electrocardioversion with cardiology.  She is not on chronic anticoagulation and she is a fall risk.  Otherwise stable and in no distress.  3:43 AM Case discussed with Dr. Paticia Stack of Cardiology.  Given that patient is off anticoagulation with a CHADsVASC score of 4, Dr. Paticia Stack advises management of rate with blood pressure control as patient is at increased risk for stroke with cardioversion; either with oral or IV metoprolol.  Dr. Paticia Stack states that symptoms may also be due to elevated BP  rather than atrial fibrillation.  HR currently ranging from 60's to upper 90's/low 100's.  Will start with 2.5mg  IV metoprolol and titrate as needed.  Dr. Paticia Stack advises hospitalist admission for troponin trending with cardiology consultation in the AM.  Consult placed to [facility] for admission.  4:57 AM Case discussed with Dr. Marlowe Sax of [facility] who will admit.   Final Clinical Impressions(s) / ED Diagnoses   Final diagnoses:  Chest pain, unspecified type  PAF (paroxysmal atrial fibrillation) [facility])  Essential hypertension    ED Discharge Orders    None       Antonietta Breach, PA-C 07/25/18 0457    Ripley Fraise, MD 07/25/18 [phone]

## 2018-07-25 NOTE — Consult Note (Signed)
Cardiology Consultation:   [name] ID: [name] MRN: [id number]; DOB: Apr 17, 1946  Admit date: 07/25/2018 Date of Consult: 07/25/2018  Primary Care Provider: [name], DO Primary Cardiologist: [name] Grayer, MD  Primary Electrophysiologist:  [name] Grayer, MD    [name] Profile:   [name] is a 73 y.o. female with a hx of CAD, chronic diastolic heart failure, atrial fibrillation, obstructive sleep apnea, hypertension, hyperlipidemia and chronic thrombocytopenia who is being seen today for [name] evaluation of abnormal atrial arrhthymia and chest pain at [name] request of Dr. Lorin [name] internal medicine.  History of Present Illness:   [name] is a 73 year old female, followed by Dr. Rayann [name].  [name] has a history of CAD.  Cardiac catheterization in 2007 showed nonobstructive disease.  EF was normal.  [name] also has chronic diastolic heart failure, obstructive sleep apnea, hypertension, hyperlipidemia and chronic thrombocytopenia.  Dr. Rayann [name] has primarily been following her for atrial fibrillation.  Per EP clinic notes, [name] was previously on amiodarone however this was discontinued due to side effects of tremor as well as [name] fact that [name] was maintaining normal sinus rhythm.  [name] does have a CHA2DS2 VASc score of 4, however [name] is not on anticoagulation due to a history of frequent falls. [name] takes ASA 325 mg daily.  [name] was last seen in [name] EP clinic by [name] nurse practitioner in November 2019.  [name] was doing well from a cardiac standpoint with no recurrent atrial fibrillation.  [name] denied chest pain and was stable from a cardiac standpoint.  No medication changes were made at that time.  It has been noted in EP clinic notes that if [name] were to develop recurrent atrial fibrillation to consider treatment with sotalol.  [name] presented to [name] ED today via EMS after [name] developed chest discomfort at home. [name] reports issues with occasional diarrhea and abdominal pain over [name]  past few weeks. Has prior h/o C-diff ~2 years ago, but symptoms different. [name] also has had a slight right sided head ache off and on x 2-3 days and went for a spinal steroid injection yesterday for DJD. [name] had to hold her ASA 5 days prior. After getting [name] injection, [name] felt a bit tired and later that evening, [name] went home laid down and felt a funny sensation in her chest. Left side. Felt a bit faint. Checked her BP and it was high in [name] 998P systolic. BP monitor read Afib and HR above 100. [name] took 325 mg of ASA and called EMS. On their arrival, [name] advised that [name] come to [name] ED. Hospital ist admitted for atrial fibrillation w/ RVR.   EKGs look most c/w MAT. In [name] ED troponin slightly abnormal at 0.09.  Blood pressure elevated in [name] 382N to 053Z systolic and 76B to low 341P diastolic.  Labs are notable for thrombocytopenia with platelet count 136.  Basic metabolic panel unremarkable.  Potassium normal at 4.3.  Serum creatinine within normal limits at 0.87. [name] is currently CP free. [name] is on metoprolol 25 mg. HR is better currently in [name] 80s.    Past Medical History:  Diagnosis Date  . Allergy   . Anemia   . Bilateral leg edema   . C. difficile enteritis   . CAD (coronary artery disease)    a. LHC 03/2006: EF 65%, mid LAD 40-50%, ostial D1 70-80%, normal circumflex, normal RCA;  b. Lexiscan Myoview 09/2010: No ischemia or scar, EF 75%;  c. Lex MV 5/14:  Normal, no ischemia, EF 71%  . Cataract    bilateral-had surgery  . Cellulitis of right leg    Hx - resolved  . Chronic diastolic CHF (congestive heart failure) ([place])    a. Echo 8/14:  Mild LVH, EF 55-65%, normal wall motion, Tr AI, mild MR, mod TR, PASP 45;  b. 12/2014 Echo: EF 60-65%, nl wall motion, mildly dil RA/LA, PASP 45mmHg.  Marland Kitchen Continuous urine leakage   . Dermatomyositis ([place])   . Deviated septum   . Essential hypertension   . Family history of adverse reaction to anesthesia    .' MY SON IS A DIFFICULT INTUBATION"  . Gall  stones    a. 08/2014 Abd U/S:  Large gallstone measuring 4.2 cm.  Marland Kitchen GERD (gastroesophageal reflux disease)   . Gout   . Hiatal hernia   . History of pancreatitis   . History of PFTs    a. PFTs 10/2012: normal.   . Hyperlipidemia   . Hypothyroidism   . Internal hemorrhoids   . Morbid obesity ([place])   . Osteoarthritis    spine hips knees  . PAF (paroxysmal atrial fibrillation) ([facility])    a. CHA2DS2VASc = 5-->poor OAC candidate 2/2 falls. No problems now per [name] 03/2016  . Pneumonia   . Seizures ([place])    as a baby  . Shingles    Hx  . Sleep apnea    Does not use CPAP  . Spinal cord compression ([place]) 02/21/2014    Past Surgical History:  Procedure Laterality Date  . CATARACT EXTRACTION, BILATERAL Bilateral   . COLONOSCOPY  03/2011  . NASAL SEPTUM SURGERY    . TOTAL ABDOMINAL HYSTERECTOMY    . VERTEBROPLASTY  Sept 7 and May 30, 2014   x 2, T11/T12  . WISDOM TOOTH EXTRACTION         Inpatient Medications: Scheduled Meds: . allopurinol  100 mg Oral BID  . aspirin EC  325 mg Oral Daily  . enoxaparin (LOVENOX) injection  40 mg Subcutaneous Q24H  . folic acid  1 mg Oral QID  . levothyroxine  100 mcg Oral Daily  . loratadine  10 mg Oral Daily  . methotrexate  10 mg Oral Q Wed  . metoprolol succinate  25 mg Oral Daily  . montelukast  10 mg Oral QHS  . pantoprazole  40 mg Oral BID  . predniSONE  5 mg Oral Q breakfast  . [START ON 07/26/2018] rosuvastatin  5 mg Oral QODAY   Continuous Infusions:  PRN Meds: acetaminophen, metoprolol tartrate, ondansetron (ZOFRAN) IV, traMADol  Allergies:    Allergies  Allergen Reactions  . Butoconazole Itching, Rash and Other (See Comments)    'Redness and swelling feverish ' 'worse symptoms '  . Lipitor [Atorvastatin Calcium] Other (See Comments)    'Muscle weakness'    . Keflex [Cephalexin] Diarrhea    Social History:   Social History   Socioeconomic History  . Marital status: Married    Spouse name: Not on file  . Number  of children: 2  . Years of education: Not on file  . Highest education level: Not on file  Occupational History    Employer: UNEMPLOYED  Social Needs  . Financial resource strain: Not on file  . Food insecurity:    Worry: Not on file    Inability: Not on file  . Transportation needs:    Medical: Not on file    Non-medical: Not on file  Tobacco Use  . Smoking  status: Never Smoker  . Smokeless tobacco: Never Used  Substance and Sexual Activity  . Alcohol use: No  . Drug use: No  . Sexual activity: Not Currently    Birth control/protection: Post-menopausal  Lifestyle  . Physical activity:    Days per week: Not on file    Minutes per session: Not on file  . Stress: Not on file  Relationships  . Social connections:    Talks on phone: Not on file    Gets together: Not on file    Attends religious service: Not on file    Active member of club or organization: Not on file    Attends meetings of clubs or organizations: Not on file    Relationship status: Not on file  . Intimate partner violence:    Fear of current or ex partner: Not on file    Emotionally abused: Not on file    Physically abused: Not on file    Forced sexual activity: Not on file  Other Topics Concern  . Not on file  Social History Narrative  . Not on file    Family History:    Family History  Problem Relation Age of Onset  . Colon cancer Mother        mets, spot on lungs and spine  . Coronary artery disease Father   . Emphysema Father   . Heart attack Brother        x 2  . Colon cancer Paternal Uncle   . Kidney disease Brother   . Hypertension Brother   . Stroke Neg Hx   . Esophageal cancer Neg Hx      ROS:  Please see [name] history of present illness.   All other ROS reviewed and negative.     Physical Exam/Data:   Vitals:   07/25/18 1215 07/25/18 1230 07/25/18 1245 07/25/18 1315  BP: 139/60 138/69 135/71 133/68  Pulse: (!) 57  75 68  Resp: (!) 22 (!) 25 20   Temp:    98.7 F (37.1 C)   TempSrc:    Oral  SpO2: 97%  97% 98%  Weight:    83.8 kg  Height:        Intake/Output Summary (Last 24 hours) at 07/25/2018 1451 Last data filed at 07/25/2018 0626 Gross per 24 hour  Intake -  Output 700 ml  Net -700 ml   Last 3 Weights 07/25/2018 07/25/2018 04/26/2018  Weight (lbs) 184 lb 11.9 oz 183 lb 181 lb 6.4 oz  Weight (kg) 83.8 kg 83.008 kg 82.283 kg     Body mass index is 41.42 kg/m.  General:  Well nourished, well developed, in no acute distress HEENT: normal Lymph: no adenopathy Neck: no JVD Endocrine:  No thryomegaly Vascular: No carotid bruits; FA pulses 2+ bilaterally without bruits  Cardiac:  normal S1, S2; RRR; no murmur  Lungs:  clear to auscultation bilaterally, no wheezing, rhonchi or rales  Abd: soft, nontender, no hepatomegaly  Ext: no edema Musculoskeletal:  No deformities, BUE and BLE strength normal and equal Skin: warm and dry  Neuro:  CNs 2-12 intact, no focal abnormalities noted Psych:  Normal affect   EKG:  [name] EKG was personally reviewed and demonstrates:  Multifocal atrial tachycardia  Telemetry:  Telemetry was personally reviewed and demonstrates:  MAT  Relevant CV Studies: 2D echo pending.  Laboratory Data:  Chemistry Recent Labs  Lab 07/25/18 0134  NA 144  K 4.3  CL 111  CO2 22  GLUCOSE  146*  BUN 17  CREATININE 0.87  CALCIUM 9.6  GFRNONAA >60  GFRAA >60  ANIONGAP 11    No results for input(s): PROT, ALBUMIN, AST, ALT, ALKPHOS, BILITOT in [name] last 168 hours. Hematology Recent Labs  Lab 07/25/18 0134  WBC 6.9  RBC 4.11  HGB 14.3  HCT 43.9  MCV 106.8*  MCH 34.8*  MCHC 32.6  RDW 13.8  PLT 136*   Cardiac Enzymes Recent Labs  Lab 07/25/18 1004  TROPONINI 0.09*    Recent Labs  Lab 07/25/18 0150  TROPIPOC 0.05    BNPNo results for input(s): BNP, PROBNP in [name] last 168 hours.  DDimer No results for input(s): DDIMER in [name] last 168 hours.  Radiology/Studies:  Dg Chest 2 View  Result Date:  07/25/2018 CLINICAL DATA:  Chest pain EXAM: CHEST - 2 VIEW COMPARISON:  09/08/2017 FINDINGS: Heart is upper limits normal in size. No confluent airspace opacities, effusions or edema. No acute bony abnormality. Prior vertebroplasty in [name] lower thoracic spine. IMPRESSION: No active cardiopulmonary disease. Electronically Signed   By: Rolm Baptise M.D.   On: 07/25/2018 01:28    Assessment and Plan:   LUNAH LOSASSO is a 73 y.o. female with a hx of CAD, chronic diastolic heart failure, atrial fibrillation, obstructive sleep apnea, hypertension, hyperlipidemia and chronic thrombocytopenia who is being seen today for [name] evaluation of abnormal atrial arrhthymia and chest pain at [name] request of Dr. Lorin [name] internal medicine.  1. MAT: initial consult was placed for ? Atrial fibrillation but review of EKGs and tele seems more c/w MAT. P waves a present. [name] has h/o OSA but cannot tolerate CPAP. This may be a contributing factor. HR has improved since initial ED evaluation. Continue metoprolol for now at current dose. If her HR increases, we can further titrate metoprolol to higher dose as long as BP allows. Keep electrolytes stable. Given CP, will check echo.  2. Chest Pain: Pt noted vague left sided CP last night. CP free currently. [name] has known CAD/ nonobstructive by cath in 2017. Initial troponin was 0.09. EKG w/o acute ST abnormalities. Will continue to cycle enzymes. Check 2D echo.   3. CAD: per above.  4. H/o Atrial Fibrillation: followed by Dr. Rayann [name]. Previously on amiodarone but d/c due to tremors and fact that pt was maintainingNSR. Today's EKGs and tele do not show afib. Per Dr. Jackalyn Lombard notes, [name] is not on a/c due to history of falls.   For questions or updates, please contact [place] Please consult [email or web address] for contact info under     Signed, Lyda Jester, PA-C  07/25/2018 2:51 PM

## 2018-07-25 NOTE — ED Notes (Signed)
Breakfast tray ordered 

## 2018-07-25 NOTE — ED Notes (Signed)
ED Provider at bedside. 

## 2018-07-25 NOTE — H&P (Addendum)
History and Physical    [name] CWC:[id number] DOB: [birth year] DOA: 07/25/2018  PCP: [name], DO Consultants:  Kathlene November - rheumatology; Nelva Bush - orthopedics; Allred - cardiology; Mannam - pulmonology; Fuller Plan - GI Patient coming from:  Home - lives with husband; NOK: Husband, [phone]  Chief Complaint: chest pain  HPI: [name] is a 73 y.o. female with medical history significant of OSA not on CPAP; afib not on AC due to falls; morbid obesity (BMI 41); HTN; dermatomyositis; chronic diastolic CHF; C. Diff colitis (08/01/17); and reported CAD (MPS WNL in 5/14) presenting with chest pain.  She reports that she is here because of "my heart".  She had a shot in her back yesterday.  When she got home, she was having some pain.  She was a little dizzy with standing.  She went to bed and felt funny; her BP was 198/100 and her heart was out of rhythm.  "And they can't get it to go down and stay down."  She was not having chest pain, but has noticed some chest discomfort in the last few hours.  She has had a headache x 3 days, starting in her left cheek and radiating into her left head.  She had a similar injection in her back in December and had no difficulty.  No SOB.  No cough.  She is not having chest discomfort now.  She has had some congestion in the mornings upon wakening.  She was previously on Plaquenil but this was stopped due to retina issues.  Her LFTs were elevated recently and they decreased her methotrexate.   ED Course:  Carryover, per Dr. Marlowe Sax:  73 year old female with history of CAD, diastolic congestive heart failure, paroxysmal A. fib not on anticoagulation secondary to fall risk presenting with a complaint of chest pain. Found to be in A. fib with RVR and blood pressure elevated. Rate fluctuating between 60s to 626R and systolic blood pressure fluctuating between 130s to 180s. ED discussed the case with cardiology who stated patient is not a candidate for cardioversion as  she is not anticoagulated. They will see the patient in the morning. Received IV metoprolol 2.5 mg once in the ED. Troponin negative. Patient took 2 regular aspirins at home prior to hospital arrival.  Review of Systems: As per HPI; otherwise review of systems reviewed and negative.   Ambulatory Status:  Ambulates with a walker  Past Medical History:  Diagnosis Date  . Allergy   . Anemia   . Bilateral leg edema   . C. difficile enteritis   . CAD (coronary artery disease)    a. LHC 03/2006: EF 65%, mid LAD 40-50%, ostial D1 70-80%, normal circumflex, normal RCA;  b. Lexiscan Myoview 09/2010: No ischemia or scar, EF 75%;  c. Lex MV 5/14:  Normal, no ischemia, EF 71%  . Cataract    bilateral-had surgery  . Cellulitis of right leg    Hx - resolved  . Chronic diastolic CHF (congestive heart failure) ([place])    a. Echo 8/14:  Mild LVH, EF 55-65%, normal wall motion, Tr AI, mild MR, mod TR, PASP 45;  b. 12/2014 Echo: EF 60-65%, nl wall motion, mildly dil RA/LA, PASP 44mmHg.  Marland Kitchen Continuous urine leakage   . Dermatomyositis ([place])   . Deviated septum   . Essential hypertension   . Family history of adverse reaction to anesthesia    .' MY SON IS A DIFFICULT INTUBATION"  . Gall stones    a. 08/2014  Abd U/S:  Large gallstone measuring 4.2 cm.  Marland Kitchen GERD (gastroesophageal reflux disease)   . Gout   . Hiatal hernia   . History of pancreatitis   . History of PFTs    a. PFTs 10/2012: normal.   . Hyperlipidemia   . Hypothyroidism   . Internal hemorrhoids   . Morbid obesity ([place])   . Osteoarthritis    spine hips knees  . PAF (paroxysmal atrial fibrillation) (HCC)    a. CHA2DS2VASc = 5-->poor OAC candidate 2/2 falls. No problems now per patient 03/2016  . Pneumonia   . Seizures ([place])    as a baby  . Shingles    Hx  . Sleep apnea    Does not use CPAP  . Spinal cord compression ([place]) 02/21/2014    Past Surgical History:  Procedure Laterality Date  . CATARACT EXTRACTION, BILATERAL Bilateral    . COLONOSCOPY  03/2011  . NASAL SEPTUM SURGERY    . TOTAL ABDOMINAL HYSTERECTOMY    . VERTEBROPLASTY  Sept 7 and May 30, 2014   x 2, T11/T12  . WISDOM TOOTH EXTRACTION      Social History   Socioeconomic History  . Marital status: Married    Spouse name: Not on file  . Number of children: 2  . Years of education: Not on file  . Highest education level: Not on file  Occupational History    Employer: UNEMPLOYED  Social Needs  . Financial resource strain: Not on file  . Food insecurity:    Worry: Not on file    Inability: Not on file  . Transportation needs:    Medical: Not on file    Non-medical: Not on file  Tobacco Use  . Smoking status: Never Smoker  . Smokeless tobacco: Never Used  Substance and Sexual Activity  . Alcohol use: No  . Drug use: No  . Sexual activity: Not Currently    Birth control/protection: Post-menopausal  Lifestyle  . Physical activity:    Days per week: Not on file    Minutes per session: Not on file  . Stress: Not on file  Relationships  . Social connections:    Talks on phone: Not on file    Gets together: Not on file    Attends religious service: Not on file    Active member of club or organization: Not on file    Attends meetings of clubs or organizations: Not on file    Relationship status: Not on file  . Intimate partner violence:    Fear of current or ex partner: Not on file    Emotionally abused: Not on file    Physically abused: Not on file    Forced sexual activity: Not on file  Other Topics Concern  . Not on file  Social History Narrative  . Not on file    Allergies  Allergen Reactions  . Butoconazole Itching, Rash and Other (See Comments)    'Redness and swelling feverish ' 'worse symptoms '  . Lipitor [Atorvastatin Calcium] Other (See Comments)    'Muscle weakness'    . Keflex [Cephalexin] Diarrhea    Family History  Problem Relation Age of Onset  . Colon cancer Mother        mets, spot on lungs and spine  .  Coronary artery disease Father   . Emphysema Father   . Heart attack Brother        x 2  . Colon cancer Paternal Uncle   .  Kidney disease Brother   . Hypertension Brother   . Stroke Neg Hx   . Esophageal cancer Neg Hx     Prior to Admission medications   Medication Sig Start Date End Date Taking? Authorizing Provider  acetaminophen (TYLENOL) 500 MG tablet Take 500-1,000 mg by mouth every 6 (six) hours as needed for mild pain or fever.    Yes [provider]  alendronate (FOSAMAX) 70 MG tablet Take 1 tablet by mouth every Tuesday.  04/01/15  Yes [provider]  allopurinol (ZYLOPRIM) 100 MG tablet Take 100 mg by mouth 2 (two) times daily.  01/14/16  Yes [provider]  aspirin EC 325 MG tablet Take 325 mg by mouth daily.   Yes [provider]  CALCIUM PO Take 500 mg by mouth daily.   Yes [provider]  cetirizine (ZYRTEC) 10 MG tablet Take 10 mg by mouth daily.   Yes [provider]  Cholecalciferol (VITAMIN D3) 2000 UNITS TABS Take 2,000 Units by mouth daily.    Yes [provider]  folic acid (FOLVITE) 1 MG tablet Take 1 mg by mouth 4 (four) times daily.   Yes [provider]  furosemide (LASIX) 40 MG tablet Take 1 tablet (40 mg total) by mouth 2 (two) times daily. 08/18/15  Yes [name], [name], MD  levothyroxine (SYNTHROID, LEVOTHROID) 100 MCG tablet Take 100 mcg by mouth daily. 07/07/17  Yes [provider]  methotrexate (RHEUMATREX) 2.5 MG tablet Take 10 mg by mouth every Wednesday.  09/17/14  Yes [provider]  metoprolol succinate (TOPROL-XL) 25 MG 24 hr tablet Take 1 tablet (25 mg total) by mouth daily. Please keep upcoming app in November for future refills. 04/04/18  Yes Allred, Jeneen Rinks, MD  montelukast (SINGULAIR) 10 MG tablet TAKE 1 TABLET BY MOUTH EVERY DAY Patient taking differently: Take 10 mg by mouth at bedtime.  11/30/17  Yes Mannam, Praveen, MD  Multiple Vitamin (MULTIVITAMIN) tablet  Take 1 tablet by mouth daily.     Yes [provider]  nystatin (MYCOSTATIN/NYSTOP) 100000 UNIT/GM POWD Apply 1 g topically 2 (two) times daily. Applies to stomach   Yes [provider]  OVER THE COUNTER MEDICATION Place 1 drop into both eyes daily as needed (for dry eyes. Advanced eye relief).   Yes [provider]  pantoprazole (PROTONIX) 40 MG tablet TAKE 1 TABLET BY MOUTH 2  TIMES DAILY BEFORE MEAL Patient taking differently: Take 40 mg by mouth 2 (two) times daily.  03/06/18  Yes Mannam, Praveen, MD  potassium chloride SA (KLOR-CON M20) 20 MEQ tablet Take 1 tablet (20 mEq total) by mouth 3 (three) times daily. 08/18/15  Yes [name], [name], MD  predniSONE (DELTASONE) 5 MG tablet Take 5 mg by mouth daily with breakfast.   Yes [provider]  rosuvastatin (CRESTOR) 5 MG tablet Take 5 mg by mouth every other day.    Yes [provider]  traMADol (ULTRAM) 50 MG tablet Take 50 mg by mouth every 12 (twelve) hours as needed for moderate pain.   Yes [provider]    Physical Exam: Vitals:   07/25/18 1215 07/25/18 1230 07/25/18 1245 07/25/18 1315  BP: 139/60 138/69 135/71 133/68  Pulse: (!) 57  75 68  Resp: (!) 22 (!) 25 20   Temp:    98.7 F (37.1 C)  TempSrc:    Oral  SpO2: 97%  97% 98%  Weight:    83.8 kg  Height:         .  General:  Appears calm and comfortable and is NAD . Eyes:  PERRL, EOMI, normal lids, iris . ENT:  grossly normal hearing, lips & tongue, mmm . Neck:  no LAD, masses or thyromegaly . Cardiovascular:  Irregularly irregular rate, generally rate controlled <100, no m/r/g. No LE edema.  Marland Kitchen Respiratory:   CTA bilaterally with no wheezes/rales/rhonchi.  Normal respiratory effort. . Abdomen:  soft, midepigastric TTP, ND, NABS . Skin:  no rash or induration seen on limited exam . Musculoskeletal:  grossly normal tone BUE/BLE, good ROM, no bony abnormality . Psychiatric:  flat mood and affect, speech fluent and  appropriate, AOx3 . Neurologic:  CN 2-12 grossly intact, moves all extremities in coordinated fashion, sensation intact    Radiological Exams on Admission: Dg Chest 2 View  Result Date: 07/25/2018 CLINICAL DATA:  Chest pain EXAM: CHEST - 2 VIEW COMPARISON:  09/08/2017 FINDINGS: Heart is upper limits normal in size. No confluent airspace opacities, effusions or edema. No acute bony abnormality. Prior vertebroplasty in the lower thoracic spine. IMPRESSION: No active cardiopulmonary disease. Electronically Signed   By: Rolm Baptise M.D.   On: 07/25/2018 01:28    EKG: Independently reviewed.  Afib with rate 108; prolonged QT 519; nonspecific ST changes with no evidence of acute ischemia   Labs on Admission: I have personally reviewed the available labs and imaging studies at the time of the admission.  Pertinent labs:   Glucose 146, BMP otherwise WNL Troponin 0.05, 0.09 Mag 2.2 WBC 6.9 Platelets 136; 146 in 3/17   Assessment/Plan Principal Problem:   Atrial fibrillation with RVR (HCC) Active Problems:   Morbid obesity (HCC)   Chronic diastolic CHF (congestive heart failure) (HCC)   Essential hypertension   Thrombocytopenia (HCC)   Afib with RVR -Patient presenting with recurrent afib.  -Also with chest pain associated. -Generally rate controlled at the time of my evaluation and also without chest pain. -Cardiology was consulted overnight and reported that patient was not a candidate for cardioversion; they will see the patient today -Will give PO medications for rate control and add prn IV Lopressor   -Continue to trend troponin - currently low suspicion for ACS -Will continue ASA 325 mg PO daily.   -Patient is not on any anti-coagulatants at home. Patient has high risk for falls.  -Echo pending. -Some of her chest pain may be related to midepigastric pain; continue PO BID PPI and consider outpatient GI f/u.  Chronic diastolic CHF -Appears to be compensated at this  time -Her last echo was in 2016  -No current indication for repeating echo at this time  HTN -Continue Toprol XL  Morbid obesity -BMI is 41 -She would clearly benefit from weight loss efforts  Thrombocytopenia  -Continue to follow, but appears to be stable at this time     DVT prophylaxis:  Lovenox (monitor given chronic thrombocytopenia) Code Status:  Full - confirmed with patient/family Family Communication: Husband, son, and an additional family member were present throughout evaluation Disposition Plan:  Home once clinically improved Consults called: Cardiology  Admission status: It is my clinical opinion that referral for OBSERVATION is reasonable and necessary in this patient based on the above information provided. The aforementioned taken together are felt to place the patient at high risk for further clinical deterioration. However it is anticipated that the patient may be medically stable for discharge from the hospital within 24 to 48 hours.    Karmen Bongo MD [facility]   How to contact the [facility]  Attending or Consulting provider [place] or covering provider during after hours [place], for this patient?  1. Check the care team in [facility] and look for a) attending/consulting [facility] provider listed and b) the [facility] team listed 2. Log into [email or web address] and use [place]'s universal password to access. If you do not have the password, please contact the hospital operator. 3. Locate the [facility] provider you are looking for under [facility] and page to a number that you can be directly reached. 4. If you still have difficulty reaching the provider, please page the [facility] (Director on Call) for the Hospitalists listed on amion for assistance.   07/25/2018, 1:49 PM

## 2018-07-26 ENCOUNTER — Observation Stay (HOSPITAL_BASED_OUTPATIENT_CLINIC_OR_DEPARTMENT_OTHER): Payer: Medicare Other

## 2018-07-26 DIAGNOSIS — I340 Nonrheumatic mitral (valve) insufficiency: Secondary | ICD-10-CM | POA: Diagnosis not present

## 2018-07-26 DIAGNOSIS — I5032 Chronic diastolic (congestive) heart failure: Secondary | ICD-10-CM | POA: Diagnosis not present

## 2018-07-26 DIAGNOSIS — R072 Precordial pain: Secondary | ICD-10-CM | POA: Diagnosis not present

## 2018-07-26 DIAGNOSIS — I10 Essential (primary) hypertension: Secondary | ICD-10-CM | POA: Diagnosis not present

## 2018-07-26 DIAGNOSIS — I4891 Unspecified atrial fibrillation: Secondary | ICD-10-CM | POA: Diagnosis not present

## 2018-07-26 DIAGNOSIS — R079 Chest pain, unspecified: Secondary | ICD-10-CM | POA: Diagnosis not present

## 2018-07-26 DIAGNOSIS — D696 Thrombocytopenia, unspecified: Secondary | ICD-10-CM

## 2018-07-26 LAB — ECHOCARDIOGRAM COMPLETE
Height: 56 in
Weight: 2950.4 oz

## 2018-07-26 MED ORDER — METOPROLOL SUCCINATE ER 25 MG PO TB24: 37.5000 mg | ORAL_TABLET | Freq: Every day | ORAL | Status: DC

## 2018-07-26 MED ORDER — METOPROLOL SUCCINATE ER 25 MG PO TB24: 37.5000 mg | ORAL_TABLET | Freq: Every day | ORAL | 0 refills | Status: DC

## 2018-07-26 NOTE — Progress Notes (Signed)
  Echocardiogram 2D Echocardiogram has been performed.  [name] 07/26/2018, 11:43 AM

## 2018-07-26 NOTE — Plan of Care (Signed)
  Problem: Activity: Goal: Ability to return to baseline activity level will improve Outcome: Progressing   Problem: Cardiovascular: Goal: Ability to achieve and maintain adequate cardiovascular perfusion will improve Outcome: Progressing Goal: Vascular access site(s) Level 0-1 will be maintained Outcome: Progressing   

## 2018-07-26 NOTE — Progress Notes (Signed)
Progress Note  Patient Name: [name] Date of Encounter: 07/26/2018  Primary Cardiologist: [name], MD   Subjective   No significant overnight events. Patient feeling much better today. She denies any further episodes of chest discomfort. No shortness of breath. Her only complaints are some mild lightheadedness as well as a headache.  Inpatient Medications    Scheduled Meds: . allopurinol  100 mg Oral BID  . aspirin EC  325 mg Oral Daily  . enoxaparin (LOVENOX) injection  40 mg Subcutaneous Q24H  . folic acid  1 mg Oral QID  . levothyroxine  100 mcg Oral Daily  . loratadine  10 mg Oral Daily  . methotrexate  10 mg Oral Q Wed  . metoprolol succinate  25 mg Oral Daily  . montelukast  10 mg Oral QHS  . pantoprazole  40 mg Oral BID  . predniSONE  5 mg Oral Q breakfast  . rosuvastatin  5 mg Oral QODAY   Continuous Infusions:  PRN Meds: acetaminophen, metoprolol tartrate, ondansetron (ZOFRAN) IV, traMADol   Vital Signs    Vitals:   07/25/18 2042 07/26/18 0415 07/26/18 0418 07/26/18 1127  BP: 125/67 (!) 148/66  132/69  Pulse: 66 (!) 52  76  Resp: (!) 22 15    Temp: 98.2 F (36.8 C) 98.4 F (36.9 C)    TempSrc: Oral Oral    SpO2: 97% 99%    Weight:   83.6 kg   Height:        Intake/Output Summary (Last 24 hours) at 07/26/2018 1241 Last data filed at 07/26/2018 0417 Gross per 24 hour  Intake 120 ml  Output 450 ml  Net -330 ml   Last 3 Weights 07/26/2018 07/25/2018 07/25/2018  Weight (lbs) 184 lb 6.4 oz 184 lb 11.9 oz 183 lb  Weight (kg) 83.643 kg 83.8 kg 83.008 kg      Telemetry    Wandering atrial pacemaker/Multifocal atrial tachycardia with heart rates mostly in the 70's to 90's with some brief episodes of tachycardia with rates in the 100's to 120's. A couple of brief runs of SVT up to about 10-12 beats noted as well as one run of non-sustained VT of 4 beats. - Personally Reviewed  ECG    No new ECG tracing today. - Personally Reviewed  Physical  Exam   GEN: Resting comfortably. Alert and in no acute distress.   Neck: Supple. Cardiac: Irregularly irregular rhythm with normal rate. No murmurs, rubs, or gallops.  Respiratory: No increased work of breathing. Clear to auscultation bilaterally.  GI: Abdomen soft, non-distended, and non-tender. Bowel sounds present. MS: No significant lower extremity edema. Skin: Warm and dry. Large ecchymoses noted on upper extremities.  Neuro:  No focal deficits.  Psych: Normal affect. Responds appropriately.   Labs    Chemistry Recent Labs  Lab 07/25/18 0134  NA 144  K 4.3  CL 111  CO2 22  GLUCOSE 146*  BUN 17  CREATININE 0.87  CALCIUM 9.6  GFRNONAA >60  GFRAA >60  ANIONGAP 11     Hematology Recent Labs  Lab 07/25/18 0134  WBC 6.9  RBC 4.11  HGB 14.3  HCT 43.9  MCV 106.8*  MCH 34.8*  MCHC 32.6  RDW 13.8  PLT 136*    Cardiac Enzymes Recent Labs  Lab 07/25/18 1004 07/25/18 1706  TROPONINI 0.09* 0.12*    Recent Labs  Lab 07/25/18 0150  TROPIPOC 0.05     BNPNo results for input(s): BNP, PROBNP in  the last 168 hours.   DDimer No results for input(s): DDIMER in the last 168 hours.   Radiology    Dg Chest 2 View  Result Date: 07/25/2018 CLINICAL DATA:  Chest pain EXAM: CHEST - 2 VIEW COMPARISON:  09/08/2017 FINDINGS: Heart is upper limits normal in size. No confluent airspace opacities, effusions or edema. No acute bony abnormality. Prior vertebroplasty in the lower thoracic spine. IMPRESSION: No active cardiopulmonary disease. Electronically Signed   By: Rolm Baptise M.D.   On: 07/25/2018 01:28    Cardiac Studies   Echo pending.  Patient Profile     [name] is a 73 y.o. female with a history of CAD, chronic diastolic heart failure, atrial fibrillation, obstructive sleep apnea, hypertension, hyperlipidemia and chronic thrombocytopenia who is being seen today for the evaluation of abnormal atrial arrhthymia and chest pain at the request of Dr.  Lorin Mercy.  Assessment & Plan    MAT - Initial consulted placed for atrial fibrillation but review of EKGs and telemetry were more consistent with MAT. - Telemetry shows wandering atrial pacemaker/MAT with heart rates mostly in the 70's to 90's. A couple of short runs of SVT up to about 10-12 beats and one run of non-sustained VT of 4 beats also noted. - Patient has a history of obstructive sleep apnea and cannot tolerate CPAP which may be contributing. - Echo pending.  - Potassium 4.3. - Magnesium 2.2. - Consider checking TSH. - Continue Toprol 25mg  daily. Continue to Titrate as need.  Chest Pain with History of CAD - Patient initially reported vague left sided chest pain. She has known non-obstructive CAD by cardiac catheterization in 2017. Patient denies any further episodes of chest pain today. - EKG showed no acute ST abnormalities. - I-stat troponin negative. Repeat troponin minimally elevated at 0.09 >> 0.12. - Echo pending. - Continue aspirin, beta-blocker, and statin. - Further recommendation pending Echo.  History of Atrial Fibrillation - Previously on Amiodarone but discontinued due to tremors and fact that patient was maintaining normal sinus rhythm. - No evidence of atrial fibrillation on telemetry.  - Per Dr. Jackalyn Lombard notes, she is not on chronic anticoagulation due to history of falls.  For questions or updates, please contact [place] Please consult [email or web address] for contact info under        Signed, Darreld Mclean, PA-C  07/26/2018, 12:41 PM

## 2018-07-26 NOTE — Discharge Summary (Signed)
Physician Discharge Summary  PAMLEA FINDER DGU:[id number] DOB: 12/24/1945 DOA: 07/25/2018  PCP: [name], DO  Admit date: 07/25/2018 Discharge date: 07/26/2018  Admitted From: Home Disposition: Home   Recommendations for Outpatient Follow-up:  1. Follow up with PCP in 1-2 weeks 2. Please obtain BMP/CBC in one week 3. Follow up with cardiology, to be arranged by [facility].   Home Health: None  Equipment/Devices: None Discharge Condition: Stable CODE STATUS: Full Diet recommendation: Heart healthy  Brief/Interim Summary: [name] is a 73 y.o. female with medical history significant of OSA not on CPAP; afib not on AC due to falls; morbid obesity (BMI 41); HTN; dermatomyositis; chronic diastolic CHF; C. Diff colitis (08/01/17); and reported CAD (MPS WNL in 5/14) presenting with chest pain.  She reports that she is here because of "my heart".  She had a shot in her back yesterday.  When she got home, she was having some pain.  She was a little dizzy with standing.  She went to bed and felt funny; her BP was 198/100 and her heart was out of rhythm.  "And they can't get it to go down and stay down."  She was not having chest pain, but has noticed some chest discomfort in the last few hours.  She has had a headache x 3 days, starting in her left cheek and radiating into her left head.  She had a similar injection in her back in December and had no difficulty.  No SOB.  No cough.  She is not having chest discomfort now.  She has had some congestion in the mornings upon wakening.  She was previously on Plaquenil but this was stopped due to retina issues.  Her LFTs were elevated recently and they decreased her methotrexate.  In the ED heart rhythm was felt to be rapid AFib. Cardiology consulted, felt more likely to be MAT. This improved with IV metoprolol. Troponin was mildly elevated felt to be consistent with demand ischemia with no chest pain. Symptoms improved and echocardiogram showed preserved LVEF  and no volume overload. Cardiology has recommended increasing home metoprolol to 37.5mg  and following up as an outpatient.  Discharge Diagnoses:  Principal Problem:   Atrial fibrillation with RVR (HCC) Active Problems:   Morbid obesity (HCC)   Chronic diastolic CHF (congestive heart failure) (HCC)   Essential hypertension   Thrombocytopenia (HCC)   Chest pain  Multifocal atrial tachycardia: Predisposed due to untreated OSA.  - Rate improved with augmentation of metoprolol dosing. Increase metoprolol 25mg  po daily > 37.5mg  po daily.   Chronic diastolic CHF: Euvolemic. Echo is stable as below.  - Continue home medications.  Troponin elevation: Demand ischemia. No chest pain. No further interventions planned per cardiology.   HTN:  - Monitor at follow up, may require augmentation of medications  Morbid obesity -BMI is 41 -She would clearly benefit from weight loss efforts  Thrombocytopenia  -Continue to follow, but appears to be stable at this time  Dermatomyositis: Stable - Continue home medications.  Discharge Instructions Discharge Instructions    Diet - low sodium heart healthy   Complete by:  As directed    Discharge instructions   Complete by:  As directed    You were admitted for multifocal atrial tachycardia (MAT) which has improved with medications. The echocardiogram shows no evidence of impaired pumping function of the heart. Cardiology has recommended discharge today and will arrange follow up. If you do not hear from the office in the next couple days,  call to schedule a hospital follow up appointment.  - Increase metoprolol from 25mg  (1 tab) to 37.5mg  (1.5 tab). A new prescription has also been sent in to the pharmacy.  - If your symptoms return, seek medical attention right away.   Increase activity slowly   Complete by:  As directed      Allergies as of 07/26/2018      Reactions   Butoconazole Itching, Rash, Other (See Comments)   'Redness and swelling  feverish ' 'worse symptoms '   Lipitor [atorvastatin Calcium] Other (See Comments)   'Muscle weakness'     Keflex [cephalexin] Diarrhea      Medication List    TAKE these medications   acetaminophen 500 MG tablet Commonly known as:  TYLENOL Take 500-1,000 mg by mouth every 6 (six) hours as needed for mild pain or fever.   alendronate 70 MG tablet Commonly known as:  FOSAMAX Take 1 tablet by mouth every Tuesday.   allopurinol 100 MG tablet Commonly known as:  ZYLOPRIM Take 100 mg by mouth 2 (two) times daily.   aspirin EC 325 MG tablet Take 325 mg by mouth daily.   CALCIUM PO Take 500 mg by mouth daily.   cetirizine 10 MG tablet Commonly known as:  ZYRTEC Take 10 mg by mouth daily.   folic acid 1 MG tablet Commonly known as:  FOLVITE Take 1 mg by mouth 4 (four) times daily.   furosemide 40 MG tablet Commonly known as:  LASIX Take 1 tablet (40 mg total) by mouth 2 (two) times daily.   levothyroxine 100 MCG tablet Commonly known as:  SYNTHROID, LEVOTHROID Take 100 mcg by mouth daily.   methotrexate 2.5 MG tablet Commonly known as:  RHEUMATREX Take 10 mg by mouth every Wednesday.   metoprolol succinate 25 MG 24 hr tablet Commonly known as:  TOPROL-XL Take 1.5 tablets (37.5 mg total) by mouth daily. What changed:    how much to take  additional instructions   montelukast 10 MG tablet Commonly known as:  SINGULAIR TAKE 1 TABLET BY MOUTH EVERY DAY What changed:  when to take this   multivitamin tablet Take 1 tablet by mouth daily.   nystatin powder Generic drug:  nystatin Apply 1 g topically 2 (two) times daily. Applies to stomach   OVER THE COUNTER MEDICATION Place 1 drop into both eyes daily as needed (for dry eyes. Advanced eye relief).   pantoprazole 40 MG tablet Commonly known as:  PROTONIX TAKE 1 TABLET BY MOUTH 2  TIMES DAILY BEFORE MEAL What changed:  See the new instructions.   potassium chloride SA 20 MEQ tablet Commonly known as:   KLOR-CON M20 Take 1 tablet (20 mEq total) by mouth 3 (three) times daily.   predniSONE 5 MG tablet Commonly known as:  DELTASONE Take 5 mg by mouth daily with breakfast.   rosuvastatin 5 MG tablet Commonly known as:  CRESTOR Take 5 mg by mouth every other day.   traMADol 50 MG tablet Commonly known as:  ULTRAM Take 50 mg by mouth every 12 (twelve) hours as needed for moderate pain.   Vitamin D3 50 MCG (2000 UT) Tabs Take 2,000 Units by mouth daily.      Follow-up Information    [name], MD Follow up.   Specialty:  Cardiology Contact information: [address] [place] [address] [phone]        [name], DO Follow up.   Specialty:  Family Medicine Contact  information: [place] [address] [phone]          Allergies  Allergen Reactions  . Butoconazole Itching, Rash and Other (See Comments)    'Redness and swelling feverish ' 'worse symptoms '  . Lipitor [Atorvastatin Calcium] Other (See Comments)    'Muscle weakness'    . Keflex [Cephalexin] Diarrhea    Consultations:  Cardiology  Procedures/Studies: Dg Chest 2 View  Result Date: 07/25/2018 CLINICAL DATA:  Chest pain EXAM: CHEST - 2 VIEW COMPARISON:  09/08/2017 FINDINGS: Heart is upper limits normal in size. No confluent airspace opacities, effusions or edema. No acute bony abnormality. Prior vertebroplasty in the lower thoracic spine. IMPRESSION: No active cardiopulmonary disease. Electronically Signed   By: Rolm Baptise M.D.   On: 07/25/2018 01:28   Subjective: Feels better, HR and BP improved. No dyspnea or chest pain. Wants to go home.  Discharge Exam: Vitals:   07/26/18 1127 07/26/18 1305  BP: 132/69 129/68  Pulse: 76 76  Resp:  18  Temp:  98 F (36.7 C)  SpO2:     General: Pt is alert, awake, not in acute distress Cardiovascular: Irreg irreg with rate in 70's, S1/S2 +, no rubs, no gallops Respiratory: CTA bilaterally, no  wheezing, no rhonchi Abdominal: Soft, NT, ND, bowel sounds + Extremities: No edema, no cyanosis. Right forearm with widespread ecchymosis, non tender no fluctuance or active bleeding.  Labs: BNP (last 3 results) No results for input(s): BNP in the last 8760 hours. Basic Metabolic Panel: Recent Labs  Lab 07/25/18 0134 07/25/18 0331  NA 144  --   K 4.3  --   CL 111  --   CO2 22  --   GLUCOSE 146*  --   BUN 17  --   CREATININE 0.87  --   CALCIUM 9.6  --   MG  --  2.2   Liver Function Tests: No results for input(s): AST, ALT, ALKPHOS, BILITOT, PROT, ALBUMIN in the last 168 hours. No results for input(s): LIPASE, AMYLASE in the last 168 hours. No results for input(s): AMMONIA in the last 168 hours. CBC: Recent Labs  Lab 07/25/18 0134  WBC 6.9  HGB 14.3  HCT 43.9  MCV 106.8*  PLT 136*   Cardiac Enzymes: Recent Labs  Lab 07/25/18 1004 07/25/18 1706  TROPONINI 0.09* 0.12*   BNP: Invalid input(s): POCBNP CBG: No results for input(s): GLUCAP in the last 168 hours. D-Dimer No results for input(s): DDIMER in the last 72 hours. Hgb A1c No results for input(s): HGBA1C in the last 72 hours. Lipid Profile No results for input(s): CHOL, HDL, LDLCALC, TRIG, CHOLHDL, LDLDIRECT in the last 72 hours. Thyroid function studies No results for input(s): TSH, T4TOTAL, T3FREE, THYROIDAB in the last 72 hours.  Invalid input(s): FREET3 Anemia work up No results for input(s): VITAMINB12, FOLATE, FERRITIN, TIBC, IRON, RETICCTPCT in the last 72 hours. Urinalysis    Component Value Date/Time   COLORURINE AMBER (A) 08/13/2015 1925   APPEARANCEUR TURBID (A) 08/13/2015 1925   LABSPEC 1.019 08/13/2015 1925   PHURINE 5.5 08/13/2015 1925   GLUCOSEU NEGATIVE 08/13/2015 1925   HGBUR MODERATE (A) 08/13/2015 1925   BILIRUBINUR NEGATIVE 08/13/2015 1925   KETONESUR NEGATIVE 08/13/2015 1925   PROTEINUR NEGATIVE 08/13/2015 1925   NITRITE NEGATIVE 08/13/2015 1925   LEUKOCYTESUR MODERATE (A)  08/13/2015 1925    Microbiology No results found for this or any previous visit (from the past 240 hour(s)).  Time coordinating discharge: Approximately  40 minutes  Patrecia Pour, MD  [facility] 07/26/2018, 2:39 PM Pager [phone]

## 2018-07-26 NOTE — Discharge Instructions (Signed)

## 2018-07-30 ENCOUNTER — Telehealth: Payer: Self-pay | Admitting: Internal Medicine

## 2018-07-30 NOTE — Telephone Encounter (Signed)
New Message   Pt c/o medication issue:  1. Name of Medication: Metoprolol  2. How are you currently taking this medication (dosage and times per day)? 25mg  but they wants to bring it up to  37.5, patient states they saw several cardiologist while in the ER and not sure which one wanted to raise the dosage.  3. Are you having a reaction (difficulty breathing--STAT)? No  4. What is your medication issue? Patient's insurance didn't approve the medication and needs doctor to submit prior authorization for patient to get medication to [facility].

## 2018-07-31 NOTE — Telephone Encounter (Signed)
**Note De-Identified  Obfuscation** I have done a Metoprolol PA through covermymeds. Key: [facility]

## 2018-07-31 NOTE — Telephone Encounter (Signed)
**Note De-Identified  Obfuscation** Letter received from [facility] stating that a PA is not needed on the pts Metoprolol and that the refill was requested too soon and that it can be filled on or after 08/05/2018.  I have faxed a copy of this letter to [facility] and I did receive a conformation that the fax was successful and I have called the pt and made her aware.

## 2018-08-01 DIAGNOSIS — E039 Hypothyroidism, unspecified: Secondary | ICD-10-CM | POA: Diagnosis not present

## 2018-08-01 DIAGNOSIS — I4891 Unspecified atrial fibrillation: Secondary | ICD-10-CM | POA: Diagnosis not present

## 2018-08-01 DIAGNOSIS — M3312 Other dermatopolymyositis with myopathy: Secondary | ICD-10-CM | POA: Diagnosis not present

## 2018-08-01 DIAGNOSIS — M1A09X0 Idiopathic chronic gout, multiple sites, without tophus (tophi): Secondary | ICD-10-CM | POA: Diagnosis not present

## 2018-08-01 DIAGNOSIS — M150 Primary generalized (osteo)arthritis: Secondary | ICD-10-CM | POA: Diagnosis not present

## 2018-08-07 DIAGNOSIS — M5134 Other intervertebral disc degeneration, thoracic region: Secondary | ICD-10-CM | POA: Diagnosis not present

## 2018-08-10 ENCOUNTER — Telehealth: Payer: Self-pay

## 2018-08-10 ENCOUNTER — Ambulatory Visit: Payer: Medicare Other | Admitting: Physician Assistant

## 2018-08-10 ENCOUNTER — Encounter: Payer: Self-pay | Admitting: Physician Assistant

## 2018-08-10 VITALS — BP 138/60 | HR 95 | Ht <= 58 in | Wt 183.8 lb

## 2018-08-10 DIAGNOSIS — I480 Paroxysmal atrial fibrillation: Secondary | ICD-10-CM

## 2018-08-10 DIAGNOSIS — R7989 Other specified abnormal findings of blood chemistry: Secondary | ICD-10-CM

## 2018-08-10 DIAGNOSIS — R778 Other specified abnormalities of plasma proteins: Secondary | ICD-10-CM

## 2018-08-10 DIAGNOSIS — I10 Essential (primary) hypertension: Secondary | ICD-10-CM

## 2018-08-10 DIAGNOSIS — I2510 Atherosclerotic heart disease of native coronary artery without angina pectoris: Secondary | ICD-10-CM

## 2018-08-10 DIAGNOSIS — I471 Supraventricular tachycardia: Secondary | ICD-10-CM

## 2018-08-10 DIAGNOSIS — I5032 Chronic diastolic (congestive) heart failure: Secondary | ICD-10-CM

## 2018-08-10 DIAGNOSIS — I4719 Other supraventricular tachycardia: Secondary | ICD-10-CM | POA: Insufficient documentation

## 2018-08-10 HISTORY — DX: Supraventricular tachycardia: I47.1

## 2018-08-10 HISTORY — DX: Other supraventricular tachycardia: I47.19

## 2018-08-10 MED ORDER — METOPROLOL SUCCINATE ER 25 MG PO TB24: ORAL_TABLET | ORAL | 3 refills | Status: DC

## 2018-08-10 NOTE — Progress Notes (Signed)
Cardiology Office Note:    Date:  08/10/2018   ID:  [name], DOB 1945-07-08, MRN [id number]  PCP:  [name], DO  Cardiologist/Electrophysiologist:  [name], MD     Referring MD: [name], DO   Chief Complaint  Patient presents with  . Hospitalization Follow-up    chest pain    History of Present Illness:    [name] is a 73 y.o. female with moderate nonobstructive coronary artery disease by prior Cardiac Catheterization, paroxysmal AFib, diastolic CHF, hypertension, hyperlipidemia, diabetes, OSA, dermatomyositis, prior thoracic compression fracture treated with kyphoplasty, obesity.  she was changed from Sotalol to Amiodarone in the past due to recurrent AFib.  However, Amiodarone was stopped due to side effects (tremors).  She has done well maintaining NSR off of antiarrhythmic drug therapy.  She is not on anticoagulation due to significant fall risk.  She was last seen in clinic by Chanetta Marshall, NP in 04/2018.    She was admitted 2/12-2/13 with chest pain and hypertensive urgency occurring 1 day after an ESI for back pain.  She initially was thought to have AF with RVR upon presentation.  But, she was evaluated by Dr. Oval Linsey for Cardiology who felt that her rhythm was MAT.  No atrial fibrillation was noted.  She did have minimally elevated Troponin levels without trend, felt to be c/w demand ischemia (Tn 0.09 >> 0.12). Echo showed normal EF.  Her -blocker dose was adjusted.     [name] returns for follow-up.  She feels that she has been in atrial fibrillation since discharge from the hospital.  Her blood pressure monitor has registered an irregular heartbeat.  Today is the first day that she has felt like she is back in normal rhythm.  She has not had any chest discomfort.  She is short of breath with activities.  She has not had paroxysmal nocturnal dyspnea or worsening edema.  She has not noticed increasing weight.  She did fall a little over 1 week ago and hurt  her back.  She continues to have pain with this.  She was told that she cannot have any more steroid injections.  Prior CV studies:   The following studies were reviewed today:  Echo 07/26/2018 EF 60-65, mild LVH, mod diastolic dysfunction, normal RVSF, severe LAE, mod MAC, mild MR, mild TR  Echo 7/16 EF 60-65%, normal wall motion, mild BAE, PASP 42 mmHg (moderately increased)  Myoview 5/14 Normal stress nuclear study.  LV Ejection Fraction: 71%.  LHC 03/2006:  EF 65%, mid LAD 40-50%, ostial D1 70-80%, normal circumflex, normal RCA   Past Medical History:  Diagnosis Date  . Anemia   . Bilateral leg edema   . C. difficile enteritis   . CAD (coronary artery disease)    a. LHC 03/2006: EF 65%, mid LAD 40-50%, ostial D1 70-80%, normal circumflex, normal RCA;  b. Lexiscan Myoview 09/2010: No ischemia or scar, EF 75%;  c. Lex MV 5/14:  Normal, no ischemia, EF 71%  . Cataract    bilateral-had surgery  . Cellulitis of right leg    Hx - resolved  . Chronic diastolic CHF (congestive heart failure) (HCC)    Echo 8/14:  Mild LVH, EF 55-65%, normal wall motion, Tr AI, mild MR, mod TR, PASP 45 // 12/2014 Echo: EF 60-65%, nl wall motion, mildly dil RA/LA, PASP 73mHg.// Echo 07/2018: EF 60-65, mild LVH, mod diastolic dysfunction, normal RVSF, severe LAE, mod MAC, mild MR, mild TR   .  Continuous urine leakage   . Dermatomyositis ([place])   . Deviated septum   . Essential hypertension   . Family history of adverse reaction to anesthesia    .' MY SON IS A DIFFICULT INTUBATION"  . Gall stones    a. 08/2014 Abd U/S:  Large gallstone measuring 4.2 cm.  Marland Kitchen GERD (gastroesophageal reflux disease)   . Gout   . Hiatal hernia   . History of pancreatitis   . History of PFTs    a. PFTs 10/2012: normal.   . Hyperlipidemia   . Hypothyroidism   . Internal hemorrhoids   . Morbid obesity ([place])   . Multifocal atrial tachycardia ([place]) 08/10/2018   Noted during admission in 07/2018  . Osteoarthritis    spine  hips knees  . PAF (paroxysmal atrial fibrillation) (HCC)    a. CHA2DS2VASc = 5-->poor OAC candidate 2/2 falls. No problems now per patient 03/2016  . Pneumonia   . Seizures ([place])    as a baby  . Shingles    Hx  . Sleep apnea    Does not use CPAP  . Spinal cord compression ([place]) 02/21/2014   Surgical Hx: The patient  has a past surgical history that includes Total abdominal hysterectomy; Nasal septum surgery; Wisdom tooth extraction; Colonoscopy (03/2011); Cataract extraction, bilateral (Bilateral); and Vertebroplasty (Sept 7 and May 30, 2014).   Current Medications: Current Meds  Medication Sig  . acetaminophen (TYLENOL) 500 MG tablet Take 500-1,000 mg by mouth every 6 (six) hours as needed for mild pain or fever.   Marland Kitchen alendronate (FOSAMAX) 70 MG tablet Take 1 tablet by mouth every Tuesday.   Marland Kitchen allopurinol (ZYLOPRIM) 100 MG tablet Take 100 mg by mouth 2 (two) times daily.   Marland Kitchen aspirin EC 325 MG tablet Take 325 mg by mouth daily.  Marland Kitchen CALCIUM PO Take 500 mg by mouth daily.  . cetirizine (ZYRTEC) 10 MG tablet Take 10 mg by mouth daily.  . Cholecalciferol (VITAMIN D3) 2000 UNITS TABS Take 2,000 Units by mouth daily.   . folic acid (FOLVITE) 1 MG tablet Take 1 mg by mouth 4 (four) times daily.  . furosemide (LASIX) 40 MG tablet Take 1 tablet (40 mg total) by mouth 2 (two) times daily.  Marland Kitchen levothyroxine (SYNTHROID, LEVOTHROID) 100 MCG tablet Take 100 mcg by mouth daily.  . methotrexate (RHEUMATREX) 2.5 MG tablet Take 10 mg by mouth every Wednesday.   . montelukast (SINGULAIR) 10 MG tablet TAKE 1 TABLET BY MOUTH EVERY DAY  . Multiple Vitamin (MULTIVITAMIN) tablet Take 1 tablet by mouth daily.    Marland Kitchen nystatin (MYCOSTATIN/NYSTOP) 100000 UNIT/GM POWD Apply 1 g topically 2 (two) times daily. Applies to stomach  . OVER THE COUNTER MEDICATION Place 1 drop into both eyes daily as needed (for dry eyes. Advanced eye relief).  . pantoprazole (PROTONIX) 40 MG tablet TAKE 1 TABLET BY MOUTH 2  TIMES DAILY  BEFORE MEAL  . phenylephrine-shark liver oil-mineral oil-petrolatum (PREPARATION H) 0.25-3-14-71.9 % rectal ointment Place 1 application rectally as needed for hemorrhoids.  . potassium chloride SA (KLOR-CON M20) 20 MEQ tablet Take 1 tablet (20 mEq total) by mouth 3 (three) times daily.  . predniSONE (DELTASONE) 5 MG tablet Take 5 mg by mouth daily with breakfast.  . rosuvastatin (CRESTOR) 5 MG tablet Take 5 mg by mouth every other day.   . saccharomyces boulardii (FLORASTOR) 250 MG capsule Take 250 mg by mouth daily.  . vitamin B-12 (CYANOCOBALAMIN) 1000 MCG tablet Take 1,000 mcg by mouth  daily.  . [DISCONTINUED] Acetaminophen (TYLENOL) 325 MG CAPS Tylenol  . [DISCONTINUED] metoprolol succinate (TOPROL-XL) 25 MG 24 hr tablet Take 1.5 tablets (37.5 mg total) by mouth daily.  . [DISCONTINUED] Metoprolol Succinate 100 MG CS24 metoprolol succinate  . [DISCONTINUED] traMADol (ULTRAM) 50 MG tablet Take 50 mg by mouth every 12 (twelve) hours as needed for moderate pain.     Allergies:   Butoconazole; Keflex [cephalexin]; and Lipitor [atorvastatin calcium]   Social History   Tobacco Use  . Smoking status: Never Smoker  . Smokeless tobacco: Never Used  Substance Use Topics  . Alcohol use: No  . Drug use: No     Family Hx: The patient's family history includes Colon cancer in her mother and paternal uncle; Coronary artery disease in her father; Emphysema in her father; Heart attack in her brother; Hypertension in her brother; Kidney disease in her brother. There is no history of Stroke or Esophageal cancer.  ROS:   Please see the history of present illness.    Review of Systems  Cardiovascular: Positive for irregular heartbeat.  Musculoskeletal: Positive for back pain and joint pain.  Gastrointestinal: Positive for abdominal pain and diarrhea.   All other systems reviewed and are negative.   EKGs/Labs/Other Test Reviewed:    EKG:  EKG is  ordered today.  The ekg ordered today  demonstrates sinus rhythm, heart rate 95, PACs versus multiple atrial foci, PVC  Recent Labs: 07/25/2018: BUN 17; Creatinine, Ser 0.87; Hemoglobin 14.3; Magnesium 2.2; Platelets 136; Potassium 4.3; Sodium 144   Recent Lipid Panel No results found for: CHOL, TRIG, HDL, CHOLHDL, LDLCALC, LDLDIRECT  Physical Exam:    VS:  BP 138/60   Pulse 95   Ht 4' 8"  (1.422 m)   Wt 183 lb 12.8 oz (83.4 kg)   SpO2 98%   BMI 41.21 kg/m     Wt Readings from Last 3 Encounters:  08/10/18 183 lb 12.8 oz (83.4 kg)  07/26/18 184 lb 6.4 oz (83.6 kg)  04/26/18 181 lb 6.4 oz (82.3 kg)     Physical Exam  Constitutional: She is oriented to person, place, and time. She appears well-developed and well-nourished. No distress.  HENT:  Head: Normocephalic and atraumatic.  Eyes: No scleral icterus.  Neck: No thyromegaly present.  Cardiovascular: Normal rate. An irregular rhythm present.  No murmur heard. Pulmonary/Chest: Effort normal. She has no wheezes. She has no rales.  Abdominal: Soft.  Musculoskeletal:        General: No edema (no pitting edema).  Lymphadenopathy:    She has no cervical adenopathy.  Neurological: She is alert and oriented to person, place, and time.  Skin: Skin is warm and dry.  Psychiatric: She has a normal mood and affect.    ASSESSMENT & PLAN:    PAF (paroxysmal atrial fibrillation) (HCC)  No documented recurrence.  However, she has felt as though she has been back in atrial fibrillation.  Dr. Oval Linsey saw her in the hospital and noted that her ECGs confirmed multifocal atrial tachycardia.  Today, she has multiple PACs versus multiple atrial foci.  Dr. Rayann Heman in the past has mentioned that she could be placed back on sotalol if she has recurrent atrial fibrillation.  I will increase her beta-blocker.  I will have her wear an event monitor to assess for recurrent atrial fibrillation.  If she has a large burden of atrial fibrillation, she may benefit from resuming sotalol.  I will  bring her back in follow-up with Dr. Rayann Heman  after her monitor.  -Increase metoprolol succinate to 37.5 mg every morning, 25 mg every afternoon  -30-day event monitor  -Follow-up Dr. Rayann Heman 1 month  Multifocal atrial tachycardia ([place]) As noted, this was documented in the hospital.  Question if this is related to her recent steroid injection or pain from her back injury.  Adjust beta-blocker as noted.  Arrange event monitor and follow-up with Dr. Rayann Heman.  Chronic diastolic CHF (congestive heart failure) (HCC)  Overall volume appears stable.  Echocardiogram in the hospital demonstrated normal LV function and moderate diastolic dysfunction.  Continue current therapy.  Elevated troponin  This appeared to be related to demand ischemia.  She did have moderate nonobstructive disease on cardiac catheterization in 2007.  She has not undergone stress testing since 2014.  Myoview in 2014 was negative for ischemia.  -Arrange Lexiscan Myoview  -Consider cardiac catheterization if high risk features noted  Coronary artery disease involving native coronary artery of native heart without angina pectoris  As noted, she had elevated troponin in the hospital.  This may have been related to demand ischemia.  She has a history of nonobstructive disease by cardiac catheterization in 2007.  Last Myoview in 2014 was negative for ischemia.  She has not had any recurrent chest pain since leaving the hospital.  As noted above, I will arrange a Lexiscan Myoview.  Essential hypertension Blood pressure at goal.   Dispo:  Return in about 4 weeks (around 09/07/2018) for Follow up after testing w/ Dr. Rayann Heman.   Medication Adjustments/Labs and Tests Ordered: Current medicines are reviewed at length with the patient today.  Concerns regarding medicines are outlined above.  Tests Ordered: Orders Placed This Encounter  Procedures  . MYOCARDIAL PERFUSION IMAGING  . CARDIAC EVENT MONITOR  . EKG 12-Lead   Medication  Changes: Meds ordered this encounter  Medications  . metoprolol succinate (TOPROL XL) 25 MG 24 hr tablet    Sig: TAKE 37.5 MG IN THE AM AND 25 MG IN THE PM DAILY BY MOUTH.    Dispense:  225 tablet    Refill:  3    Signed, Richardson Dopp, PA-C  08/10/2018 1:56 PM    [facility] [place], [place], [place]  [address] Phone: [phone]; Fax: [phone]

## 2018-08-10 NOTE — Telephone Encounter (Signed)
Pt was seen in the office by Richardson Dopp today (2/28). Per Richardson Dopp, PA-C to increase Toprol Xl to 37.5 mg in the am and 25 mg in th pm daily. Call pt pharmacy ([facility]) to make sure that there didn't need to be a prior auth for the increase of Toprol. Pt was recently approved for refill see phone note from 07/30/18. The pharmacy states that a PA is not needed and they will work on refilling her medication. Pharmacy thanked me for the call.

## 2018-08-10 NOTE — Patient Instructions (Signed)
Medication Instructions:  Your physician has recommended you make the following change in your medication:  1. INCREASE TOPROL XL TO 37.5 MG IN THE AM AND 25 MG IN THE PM  If you need a refill on your cardiac medications before your next appointment, please call your pharmacy.   Lab work: NONE If you have labs (blood work) drawn today and your tests are completely normal, you will receive your results only by: Marland Kitchen MyChart Message (if you have MyChart) OR . A paper copy in the mail If you have any lab test that is abnormal or we need to change your treatment, we will call you to review the results.  Testing/Procedures: Your physician has requested that you have a lexiscan myoview. For further information please visit [email or web address]. Please follow instruction sheet, as given.  Your physician has recommended that you wear an 30 DAY event monitor. Event monitors are medical devices that record the heart's electrical activity. Doctors most often [place] these monitors to diagnose arrhythmias. Arrhythmias are problems with the speed or rhythm of the heartbeat. The monitor is a small, portable device. You can wear one while you do your normal daily activities. This is usually used to diagnose what is causing palpitations/syncope (passing out).  Follow-Up: At [facility], you and your health needs are our priority.  As part of our continuing mission to provide you with exceptional heart care, we have created designated Provider Care Teams.  These Care Teams include your primary Cardiologist (physician) and Advanced Practice Providers (APPs -  Physician Assistants and Nurse Practitioners) who all work together to provide you with the care you need, when you need it. You will need a follow up appointment in 1 months.   You may see [name], MD or one of the following Advanced Practice Providers on your designated Care Team:   Chanetta Marshall, NP . Tommye Standard, PA-C  Any Other Special Instructions  Will Be Listed Below (If Applicable).

## 2018-08-14 ENCOUNTER — Telehealth (HOSPITAL_COMMUNITY): Payer: Self-pay | Admitting: *Deleted

## 2018-08-14 NOTE — Telephone Encounter (Signed)
Patient given detailed instructions per Myocardial Perfusion Study Information Sheet for the test on 08/17/18. Patient notified to arrive 15 minutes early and that it is imperative to arrive on time for appointment to keep from having the test rescheduled.  If you need to cancel or reschedule your appointment, please call the office within 24 hours of your appointment. . Patient verbalized understanding. Kirstie Peri

## 2018-08-16 DIAGNOSIS — M1A09X0 Idiopathic chronic gout, multiple sites, without tophus (tophi): Secondary | ICD-10-CM | POA: Diagnosis not present

## 2018-08-16 DIAGNOSIS — D696 Thrombocytopenia, unspecified: Secondary | ICD-10-CM | POA: Diagnosis not present

## 2018-08-16 DIAGNOSIS — R748 Abnormal levels of other serum enzymes: Secondary | ICD-10-CM | POA: Diagnosis not present

## 2018-08-16 DIAGNOSIS — E7800 Pure hypercholesterolemia, unspecified: Secondary | ICD-10-CM | POA: Diagnosis not present

## 2018-08-16 DIAGNOSIS — E039 Hypothyroidism, unspecified: Secondary | ICD-10-CM | POA: Diagnosis not present

## 2018-08-17 ENCOUNTER — Ambulatory Visit (HOSPITAL_COMMUNITY): Payer: Medicare Other | Attending: Internal Medicine

## 2018-08-17 ENCOUNTER — Telehealth: Payer: Self-pay

## 2018-08-17 ENCOUNTER — Encounter: Payer: Self-pay | Admitting: Physician Assistant

## 2018-08-17 DIAGNOSIS — I2510 Atherosclerotic heart disease of native coronary artery without angina pectoris: Secondary | ICD-10-CM | POA: Diagnosis not present

## 2018-08-17 DIAGNOSIS — R7989 Other specified abnormal findings of blood chemistry: Secondary | ICD-10-CM | POA: Diagnosis not present

## 2018-08-17 DIAGNOSIS — R778 Other specified abnormalities of plasma proteins: Secondary | ICD-10-CM

## 2018-08-17 LAB — MYOCARDIAL PERFUSION IMAGING
LV dias vol: 60 mL (ref 46–106)
LV sys vol: 35 mL
Peak HR: 117 {beats}/min
Rest HR: 71 {beats}/min
SDS: 0
SRS: 0
SSS: 0
TID: 1.22

## 2018-08-17 MED ORDER — TECHNETIUM TC 99M TETROFOSMIN IV KIT
30.6000 | PACK | Freq: Once | INTRAVENOUS | Status: AC | PRN
  Administered 2018-08-17: 30.6 via INTRAVENOUS
  Filled 2018-08-17: qty 31

## 2018-08-17 MED ORDER — TECHNETIUM TC 99M TETROFOSMIN IV KIT
10.1000 | PACK | Freq: Once | INTRAVENOUS | Status: AC | PRN
  Administered 2018-08-17: 10.1 via INTRAVENOUS
  Filled 2018-08-17: qty 11

## 2018-08-17 MED ORDER — REGADENOSON 0.4 MG/5ML IV SOLN
0.4000 mg | Freq: Once | INTRAVENOUS | Status: AC
  Administered 2018-08-17: 0.4 mg via INTRAVENOUS

## 2018-08-17 NOTE — Telephone Encounter (Signed)
Notes recorded by Frederik Schmidt, RN on 08/17/2018 at 4:27 PM EST The patient has been notified of the result and verbalized understanding. All questions (if any) were answered. Frederik Schmidt, RN 08/17/2018 4:27 PM   ------

## 2018-08-17 NOTE — Telephone Encounter (Signed)
-----   Message from Liliane Shi, [place] sent at 08/17/2018  4:22 PM EST ----- Please call patient The Nuclear stress test shows no signs of a blockage ("normal perfusion").  The heart function is reported as low (EF 43%; "intermediate risk").  But, the echocardiogram in 07/2018 demonstrated normal heart function (ejection fraction).  The echocardiogram is more accurate and therefore confirms that the ejection fraction is normal.   Recommendations:  - Continue current medications and follow up as planned.   - Send copy to PCP  Richardson Dopp, PA-C    08/17/2018 4:18 PM

## 2018-08-23 ENCOUNTER — Telehealth: Payer: Self-pay | Admitting: Physician Assistant

## 2018-08-23 DIAGNOSIS — Z79899 Other long term (current) drug therapy: Secondary | ICD-10-CM

## 2018-08-23 DIAGNOSIS — I10 Essential (primary) hypertension: Secondary | ICD-10-CM

## 2018-08-23 DIAGNOSIS — I5032 Chronic diastolic (congestive) heart failure: Secondary | ICD-10-CM

## 2018-08-23 DIAGNOSIS — I480 Paroxysmal atrial fibrillation: Secondary | ICD-10-CM

## 2018-08-23 NOTE — Telephone Encounter (Signed)
Pt called and said her dosage of metoprolol succinate (TOPROL XL) 25 MG 24 hr tablet was increased from 25mg  to 62 mg yesterday, but she is still retaining fluid and has an irregular heart rhythm, even though she takes a fluid pill 2x a day. She said she is starting to develop a cough when she lays down, and is worried she is getting fluid around her lungs. She is worried that the med isn't working yet. Please advise

## 2018-08-24 ENCOUNTER — Other Ambulatory Visit: Payer: Self-pay

## 2018-08-24 DIAGNOSIS — I480 Paroxysmal atrial fibrillation: Secondary | ICD-10-CM

## 2018-08-24 DIAGNOSIS — I5032 Chronic diastolic (congestive) heart failure: Secondary | ICD-10-CM

## 2018-08-24 NOTE — Addendum Note (Signed)
Addended by: Michae Kava on: 08/24/2018 12:21 PM   Modules accepted: Orders

## 2018-08-24 NOTE — Telephone Encounter (Signed)
Please call patient to discuss her complaints. Richardson Dopp, PA-C    08/24/2018 8:14 AM

## 2018-08-24 NOTE — Telephone Encounter (Signed)
Called the pt and she is reporting that the last 2 days she has felt she was experiencing some added fluid... she says that her feet up to her ankles were "puffy" and she has had a wet cough... no sob...she says that last night she had urinated a little more than usual and today she is feeling much improved... I advised her to elevate her feet when she is sitting and I explained to her foods that have added sodium may cause her to have some added fluid. Pt says that she will make these changes and will monitor her symptoms and call if they worsen. I will also forward to Richardson Dopp PA for his review and will call the pt if he has any changes.

## 2018-08-24 NOTE — Telephone Encounter (Signed)
Increase Lasix to 80 mg in A and 40 mg in P x 3 days. Increase K+ to 40 mEq / 20 mEq / 20 mEq x 3 days. After 3 days, resume Lasix 40 mg twice daily and K+ 20 mEq three times a day. BMET, BNP next week (~Tues). If symptoms do not improve, call.  If no improvement arrange follow up in clinic next week. Richardson Dopp, PA-C    08/24/2018 10:56 AM

## 2018-08-24 NOTE — Telephone Encounter (Signed)
Pt verbalized understanding and wrote down and read back to me Trinidad Curet PA instructions for the next 3 days and will come in 08/28/18 for labs and will also call to make an appt if she is not feeling better.

## 2018-08-28 ENCOUNTER — Other Ambulatory Visit: Payer: Medicare Other

## 2018-08-28 ENCOUNTER — Other Ambulatory Visit: Payer: Self-pay

## 2018-08-28 DIAGNOSIS — Z79899 Other long term (current) drug therapy: Secondary | ICD-10-CM | POA: Diagnosis not present

## 2018-08-28 DIAGNOSIS — I10 Essential (primary) hypertension: Secondary | ICD-10-CM | POA: Diagnosis not present

## 2018-08-28 DIAGNOSIS — I480 Paroxysmal atrial fibrillation: Secondary | ICD-10-CM

## 2018-08-28 DIAGNOSIS — I5032 Chronic diastolic (congestive) heart failure: Secondary | ICD-10-CM

## 2018-08-28 LAB — BASIC METABOLIC PANEL
BUN/Creatinine Ratio: 16 (ref 12–28)
BUN: 19 mg/dL (ref 8–27)
CO2: 25 mmol/L (ref 20–29)
Calcium: 9.9 mg/dL (ref 8.7–10.3)
Chloride: 100 mmol/L (ref 96–106)
Creatinine, Ser: 1.18 mg/dL — ABNORMAL HIGH (ref 0.57–1.00)
GFR calc Af Amer: 53 mL/min/{1.73_m2} — ABNORMAL LOW (ref >59)
GFR calc non Af Amer: 46 mL/min/{1.73_m2} — ABNORMAL LOW (ref >59)
Glucose: 87 mg/dL (ref 65–99)
Potassium: 3.7 mmol/L (ref 3.5–5.2)
Sodium: 147 mmol/L — ABNORMAL HIGH (ref 134–144)

## 2018-08-28 LAB — PRO B NATRIURETIC PEPTIDE: NT-Pro BNP: 642 pg/mL — ABNORMAL HIGH (ref 0–301)

## 2018-08-29 ENCOUNTER — Telehealth: Payer: Self-pay | Admitting: Internal Medicine

## 2018-08-29 NOTE — Telephone Encounter (Signed)
New Message ° ° ° ° ° °Patient returned your call, pls call again. °

## 2018-08-29 NOTE — Telephone Encounter (Signed)
ATTEMPTED TO CALL PT PHONE LINE BUSY

## 2018-09-04 ENCOUNTER — Encounter (INDEPENDENT_AMBULATORY_CARE_PROVIDER_SITE_OTHER): Payer: Medicare Other

## 2018-09-04 ENCOUNTER — Encounter: Payer: Self-pay | Admitting: Internal Medicine

## 2018-09-04 ENCOUNTER — Telehealth: Payer: Self-pay | Admitting: Radiology

## 2018-09-04 DIAGNOSIS — I480 Paroxysmal atrial fibrillation: Secondary | ICD-10-CM | POA: Diagnosis not present

## 2018-09-04 NOTE — Telephone Encounter (Signed)
Event monitor was enrolled to be mailed on 3-20 due to covid-19

## 2018-09-05 ENCOUNTER — Ambulatory Visit: Payer: Medicare Other | Admitting: Internal Medicine

## 2018-09-13 ENCOUNTER — Telehealth: Payer: Self-pay | Admitting: *Deleted

## 2018-09-13 NOTE — Telephone Encounter (Signed)
Received call from [facility] with [employer]. Pt had first documented episode of Afib/RVR HR 150-160. Received faxed copy of report: DAY 10 - Critical at 2:58 EST Called the patient but did not reach her.  Left message for her to call back.  Reviewed with Dr. Meda Coffee who agrees this is afib and would like Dr. Rayann Heman recommendation regarding blood thinner. Strips scanned into patient's chart for Dr. Rayann Heman to review/make recommendations.

## 2018-09-14 NOTE — Telephone Encounter (Signed)
Showed our DOD Dr. Irish Lack and he would like for this event to be scanned into the chart as well, for Dr Rayann Heman to review and advise on.  Per DOD Dr Irish Lack, with both tracings from yesterday, it is questionable if the pt is in AFIB vs MAT, per history, and Dr Rayann Heman would need to further assess that.  Will be going to have this monitor scanned into the system now, so Dr Rayann Heman and Willeen Cass RN will be able to further review and advise for this pt.  Did make Dr Jackalyn Lombard covering RN that these monitor recordings are scanned into the pts chart, and DOD's would like for Dr Rayann Heman to further advise on medication management on this pt.  Also endorsed to the pt that we will continue to monitor and have Dr Rayann Heman and his RN further follow-up with her on further management.  Pt verbalized understanding and agrees with this plan.

## 2018-09-14 NOTE — Telephone Encounter (Signed)
Another report received from [employer] on this pt again that on 09/13/18 at 7:23 pm CST the pt was noted to be in sinus arrhythmia w/atrial run/PACs.  Pts HR was 159 bpm. Called the pt to ask if she was symptomatic at that time and she stated she did not have any chest pain, sob, dizziness, diaphoresis, n/v, pre-syncopal or syncopal episodes.  Pt states she is dealing with a rather tough cold at this time, and is having really bad coughing spells.  Pt states her PCP associated this with her known history of allergies, for she stated she has no fever.  Pt states that her PCP started her on regular Mucinex today, as well as advised her to continue her current OTC allergy meds, as listed on file.  Informed the pt that I will go and show her event recording to our DOD Dr Irish Lack, for further review and recommendation, then follow-up with the pt thereafter.  Informed the pt that I will also reach out to Dr Rayann Heman and his RN as well, and make them aware of this other monitor occurrence, so that they can review and follow-up with the pt as well. Pt verbalized understanding and agrees with this plan.

## 2018-09-17 ENCOUNTER — Telehealth: Payer: Self-pay | Admitting: Internal Medicine

## 2018-09-17 NOTE — Telephone Encounter (Signed)
New Message ° ° ° °Patient returning phone call  °

## 2018-09-18 ENCOUNTER — Telehealth: Payer: Self-pay

## 2018-09-18 NOTE — Telephone Encounter (Signed)
Spoke with pt regarding appt on 09/19/18. Pt stated she will check BP, weight, and pulse day of appt. Pt also stated she is unable to upload EKG. Pt questions and concerns were address.

## 2018-09-18 NOTE — Telephone Encounter (Signed)
Sent to scheduling.  Move May appt to 09/19/2018 virtual visit with [facility]

## 2018-09-19 ENCOUNTER — Telehealth (INDEPENDENT_AMBULATORY_CARE_PROVIDER_SITE_OTHER): Payer: Medicare Other | Admitting: Internal Medicine

## 2018-09-19 VITALS — BP 123/59 | HR 75 | Temp 98.4°F | Wt 183.4 lb

## 2018-09-19 DIAGNOSIS — I480 Paroxysmal atrial fibrillation: Secondary | ICD-10-CM

## 2018-09-19 DIAGNOSIS — I10 Essential (primary) hypertension: Secondary | ICD-10-CM

## 2018-09-19 DIAGNOSIS — I2510 Atherosclerotic heart disease of native coronary artery without angina pectoris: Secondary | ICD-10-CM | POA: Diagnosis not present

## 2018-09-19 DIAGNOSIS — I5032 Chronic diastolic (congestive) heart failure: Secondary | ICD-10-CM

## 2018-09-19 DIAGNOSIS — I471 Supraventricular tachycardia: Secondary | ICD-10-CM | POA: Diagnosis not present

## 2018-09-19 NOTE — Progress Notes (Signed)
Electrophysiology TeleHealth Note   Due to national recommendations of social distancing due to COVID 19, an audio/video telehealth visit is felt to be most appropriate for this patient at this time.  See MyChart message from today for the patient's consent to telehealth for [facility].   Date:  09/19/2018   ID:  [name], DOB [birth year], MRN [id number]  Location: patient's home  Provider location: [address], [place]  Evaluation Performed: Follow-up visit  PCP:  [name], DO   Electrophysiologist:  Dr Rayann Heman  Chief Complaint:  afib  History of Present Illness:    [name] is a 73 y.o. female who presents via audio/video conferencing for a telehealth visit today.  Since last being seen in our clinic, the patient reports doing well. She has allergies which are bothering her.  She has a cough.  Her SOB is mild and similar to prior allergy seasons.  She has mild palpitations but does not feel that she is having sustained afib. Today, she denies symptoms of chest pain, dizziness, presyncope, or syncope. + mild chronic edema. The patient is otherwise without complaint today.  The patient denies symptoms of fevers, chills,or new SOB worrisome for COVID 19.  Past Medical History:  Diagnosis Date   Anemia    Bilateral leg edema    C. difficile enteritis    CAD (coronary artery disease)    a. LHC 03/2006: EF 65%, mid LAD 40-50%, ostial D1 70-80%, normal circumflex, normal RCA;  b. Lexiscan Myoview 09/2010: No ischemia or scar, EF 75%;  c. Lex MV 5/14:  Normal, no ischemia, EF 71% // Myoview 08/2018: EF 43, normal perfusion; Intermediate Risk due to low EF (Echo 07/2018 with normal EF)    Cataract    bilateral-had surgery   Cellulitis of right leg    Hx - resolved   Chronic diastolic CHF (congestive heart failure) ([place])    Echo 8/14:  Mild LVH, EF 55-65%, normal wall motion, Tr AI, mild MR, mod TR, PASP 45 // 12/2014 Echo: EF 60-65%, nl wall motion,  mildly dil RA/LA, PASP 64mHg.// Echo 07/2018: EF 60-65, mild LVH, mod diastolic dysfunction, normal RVSF, severe LAE, mod MAC, mild MR, mild TR    Continuous urine leakage    Dermatomyositis (HCC)    Deviated septum    Essential hypertension    Family history of adverse reaction to anesthesia    .' MY SON IS A DIFFICULT INTUBATION"   Gall stones    a. 08/2014 Abd U/S:  Large gallstone measuring 4.2 cm.   GERD (gastroesophageal reflux disease)    Gout    Hiatal hernia    History of pancreatitis    History of PFTs    a. PFTs 10/2012: normal.    Hyperlipidemia    Hypothyroidism    Internal hemorrhoids    Morbid obesity ([facility]    Multifocal atrial tachycardia ([facility] 08/10/2018   Noted during admission in 07/2018   Osteoarthritis    spine hips knees   PAF (paroxysmal atrial fibrillation) ([facility]    a. CHA2DS2VASc = 5-->poor OAC candidate 2/2 falls. No problems now per patient 03/2016   Pneumonia    Seizures ([facility]    as a [place]   Shingles    Hx   Sleep apnea    Does not use CPAP   Spinal cord compression ([facility] 02/21/2014    Past Surgical History:  Procedure Laterality Date   CATARACT EXTRACTION, BILATERAL Bilateral  COLONOSCOPY  03/2011   NASAL SEPTUM SURGERY     TOTAL ABDOMINAL HYSTERECTOMY     VERTEBROPLASTY  Sept 7 and May 30, 2014   x 2, T11/T12   WISDOM TOOTH EXTRACTION      Current Outpatient Medications  Medication Sig Dispense Refill   acetaminophen (TYLENOL) 500 MG tablet Take 500-1,000 mg by mouth every 6 (six) hours as needed for mild pain or fever.      alendronate (FOSAMAX) 70 MG tablet Take 1 tablet by mouth every Tuesday.      allopurinol (ZYLOPRIM) 100 MG tablet Take 100 mg by mouth 2 (two) times daily.      aspirin EC 325 MG tablet Take 325 mg by mouth daily.     CALCIUM PO Take 500 mg by mouth daily.     cetirizine (ZYRTEC) 10 MG tablet Take 10 mg by mouth daily.     Cholecalciferol (VITAMIN D3) 2000 UNITS TABS Take 2,000  Units by mouth daily.      folic acid (FOLVITE) 1 MG tablet Take 1 mg by mouth 4 (four) times daily.     furosemide (LASIX) 40 MG tablet Take 1 tablet (40 mg total) by mouth 2 (two) times daily. 120 tablet 5   levothyroxine (SYNTHROID, LEVOTHROID) 100 MCG tablet Take 100 mcg by mouth daily.     methotrexate (RHEUMATREX) 2.5 MG tablet Take 10 mg by mouth every Wednesday.   3   metoprolol succinate (TOPROL XL) 25 MG 24 hr tablet TAKE 37.5 MG IN THE AM AND 25 MG IN THE PM DAILY BY MOUTH. 225 tablet 3   montelukast (SINGULAIR) 10 MG tablet TAKE 1 TABLET BY MOUTH EVERY DAY 90 tablet 0   Multiple Vitamin (MULTIVITAMIN) tablet Take 1 tablet by mouth daily.       nystatin (MYCOSTATIN/NYSTOP) 100000 UNIT/GM POWD Apply 1 g topically 2 (two) times daily. Applies to stomach     OVER THE COUNTER MEDICATION Place 1 drop into both eyes daily as needed (for dry eyes. Advanced eye relief).     pantoprazole (PROTONIX) 40 MG tablet TAKE 1 TABLET BY MOUTH 2  TIMES DAILY BEFORE MEAL 180 tablet 0   phenylephrine-shark liver oil-mineral oil-petrolatum (PREPARATION H) 0.25-3-14-71.9 % rectal ointment Place 1 application rectally as needed for hemorrhoids.     potassium chloride SA (KLOR-CON M20) 20 MEQ tablet Take 1 tablet (20 mEq total) by mouth 3 (three) times daily. 60 tablet 11   predniSONE (DELTASONE) 5 MG tablet Take 5 mg by mouth daily with breakfast.     rosuvastatin (CRESTOR) 5 MG tablet Take 5 mg by mouth every other day.      saccharomyces boulardii (FLORASTOR) 250 MG capsule Take 250 mg by mouth daily.     vitamin B-12 (CYANOCOBALAMIN) 1000 MCG tablet Take 1,000 mcg by mouth daily.     No current facility-administered medications for this visit.     Allergies:   Butoconazole; Keflex [cephalexin]; and Lipitor [atorvastatin calcium]   Social History:  The patient  reports that she has never smoked. She has never used smokeless tobacco. She reports that she does not drink alcohol or use  drugs.   Family History:  The patient's  family history includes Colon cancer in her mother and paternal uncle; Coronary artery disease in her father; Emphysema in her father; Heart attack in her brother; Hypertension in her brother; Kidney disease in her brother.   ROS:  Please see the history of present illness.   All other systems  are personally reviewed and negative.    Exam:    Vital Signs:  BP (!) 123/59    Pulse 75    Temp 98.4 F (36.9 C)    Wt 183 lb 6.4 oz (83.2 kg)    BMI 41.12 kg/m   Well appearing,appears older than stated age, alert and conversant, regular work of breathing,  good skin color Eyes- anicteric, neuro- grossly intact, skin- no apparent rash or lesions or cyanosis, mouth- oral mucosa is pink   Labs/Other Tests and Data Reviewed:    Recent Labs: 07/25/2018: Hemoglobin 14.3; Magnesium 2.2; Platelets 136 08/28/2018: BUN 19; Creatinine, Ser 1.18; NT-Pro BNP 642; Potassium 3.7; Sodium 147   Wt Readings from Last 3 Encounters:  09/19/18 183 lb 6.4 oz (83.2 kg)  08/10/18 183 lb 12.8 oz (83.4 kg)  07/26/18 184 lb 6.4 oz (83.6 kg)     Other studies personally reviewed: Additional studies/ records that were reviewed today include: my prior note, EP NP notes, records from 07/2018 hospitalziation, strips from monitor that she is currently wearing  Review of the above records today demonstrates: as above Prior radiographs: cxr 07/25/2018 shows no as dz  ekg from 08/10/2018 reveals sinus with PACs   ASSESSMENT & PLAN:    1.  Paroxysmal atrial fibrillation/ atach/ PACs She was in the ED in February with this.  She was at that time felt to have MAT.  She is wearing an event monitor currently.  I have reviewed strips of her monitor.  (she is still wearing).  This has documented afib as well as disorganized atrial activity and runs of atypical flutter/ atach.  Amiodarone previously stopped due to tremor. chads2vasc score is 4.  Her PCP previously stopped her  anticoagulation due to minor bleeding.  I have advised anticoagulation with eliquis.  She states "I will talk to my husband and see what he says".   Reduce ASA to 34m daily in the interim. For now, I would advise that we continue our current regimen and review her monitor once it is available.  Consider further titration of metoprolol depending on monitor findings.  We could consider starting sotalol 830mBID if needed. I would not advise further monitors in the future.  We could consider ILR placement if further arrhythmia management is required.  2. CAD No ischemic symptoms  3. Chronic diastolic dysfunction Recent labs are reviewed Stable No changes Sodium restriction advised  4. Overweight Lifestyle modification is encouraged  5. HTN Stable No change required today  6. COVID 19 screen The patient denies symptoms of COVID 19 at this time.  The importance of social distancing was discussed today.  Follow-up:  AF clinic in 4 weeks.  She would benefit from close management by AF clinic at this time  Current medicines are reviewed at length with the patient today.   The patient does not have concerns regarding her medicines.  The following changes were made today:  none  Labs/ tests ordered today include:  No orders of the defined types were placed in this encounter.    Patient Risk:  after full review of this patients clinical status, I feel that they are at moderate risk at this time.  Today, I have spent 20 minutes with the patient with telehealth technology discussing atrial arrhythmias .    SiArmy FossaMD  09/19/2018 12:02 PM     [facility] [phone]) ([phone])

## 2018-09-20 ENCOUNTER — Telehealth: Payer: Self-pay | Admitting: Pulmonary Disease

## 2018-09-20 ENCOUNTER — Telehealth: Payer: Self-pay | Admitting: Internal Medicine

## 2018-09-20 MED ORDER — BENZONATATE 200 MG PO CAPS: 200.0000 mg | ORAL_CAPSULE | Freq: Three times a day (TID) | ORAL | 1 refills | Status: DC | PRN

## 2018-09-20 MED ORDER — PANTOPRAZOLE SODIUM 40 MG PO TBEC: DELAYED_RELEASE_TABLET | ORAL | 0 refills | Status: DC

## 2018-09-20 NOTE — Telephone Encounter (Signed)
Called and spoke with pt stating the recs to her per Aaron Edelman and stated that she could take Rx tessalon perles as needed for cough. Pt expressed understanding. Asked pt if she has been taking her protonix every day and pt stated that she has been taking it every day as prescribed.  Rx of tessalon has been sent to pt's preferred pharmacy. Nothing further needed.

## 2018-09-20 NOTE — Telephone Encounter (Signed)
Primary Pulmonologist: Mann[place] Last office visit and with whom: 09/08/2017 with Dr. Vaughan Browner What do we see them for (pulmonary problems): DOE/cough Last OV assessment/plan:  Plan/Recommendations: - Continue protonix - Continue methorexate, plaquinel, prednisone - Continue Zyrtec, start Singulair for allergic rhinitis - Chest x-ray  Follow up in 1 year  Marshell Garfinkel MD [facility] Pager [phone] 09/08/2017, 1:38 PM  CC: [name], MD      Patient Instructions by Marshell Garfinkel, MD at 09/08/2017 1:30 PM  Author: Marshell Garfinkel, MD Author Type: Physician Filed: 09/08/2017 1:51 PM  Note Status: Signed Cosign: Cosign Not Required Encounter Date: 09/08/2017  Editor: Marshell Garfinkel, MD (Physician)    We will get a chest x-ray today Start you on Singulair Follow-up in 6 months.    Instructions   We will get a chest x-ray today Start you on Singulair Follow-up in 6 months.       Was appointment offered to patient (explain)?  Pt requesting something to be called in for cough   Reason for call: Called and spoke with pt stating to her that we can refill her protonix and also asked pt in regards to her cough that she has had x3 weeks now. Pt stated her cough is mainly a dry cough but will have occ clear mucus. Pt stated due to the afib, she currently is wearing a heart monitor due to also being tachycardia and the cardiologist is handling this.  Along with pt's cough, pt does have come chest tightness and SOB which she states is due to the afib.  Pt said she has tried her protonix, OTC mucinex, and robitussin to see if it would help with her cough and she stated that it has helped some but stated she feels like she might need something else as well. Pt stated she has coughed so much to the point that she has thrown up from it.  Pt denies any fever, has not travelled anywhere and has not been around anyone that has been sick.  Aaron Edelman please  advise on this for pt. Thanks!

## 2018-09-20 NOTE — Telephone Encounter (Signed)
Error

## 2018-09-20 NOTE — Telephone Encounter (Signed)
Patient should not be trying her Protonix she should be taking that daily.  Please confirm with the patient that she has been doing that.  This is to help control her known history of GERD.  Ensure the patient is taking the rest of her medications as prescribed  Patient can use over-the-counter cough suppressant such as Robitussin or Delsym.  Can also offer:  Tessalon Perles 200 mg  >>>can be taken every 8 hours as needed for cough.   >>>Can give her 90 tablets. >>>1 refill  Place the order.   Wyn Quaker FNP

## 2018-09-21 ENCOUNTER — Encounter (HOSPITAL_COMMUNITY): Payer: Self-pay

## 2018-09-25 ENCOUNTER — Telehealth: Payer: Self-pay | Admitting: *Deleted

## 2018-09-25 NOTE — Telephone Encounter (Signed)
Chest hurts, worse with cough. Having pain in left breast and above it.  Has a mammogram scheduled.  Years ago had costochondritis and sometimes it feels like that. Feel so washed out.  Has been sleeping in recliner  No specific recollection of this event. Has appointment in [place] clinic 10/16/18.   Asked her decision but she has 2 questions - she was told she had low platelets and was anemic and she shouldn't be put back on blood thinners.  What are his thoughts about this.

## 2018-09-25 NOTE — Telephone Encounter (Signed)
Follow up   Patient is returning your call. Please call. Patient would like to be contacted on cell phone #.

## 2018-09-25 NOTE — Telephone Encounter (Signed)
Received Cardiac Report Day 21, serious  09/24/18 at 5:37 pmCST Auto triggered, afib RVR sustained with PVCs (1 in 1 min)  Reviewed by Dr. Curt Bears and rhythm confirmed. Pt had virtual visit with Dr. Rayann Heman on 4/78:  1.  Paroxysmal atrial fibrillation/ atach/ PACs She was in the ED in February with this.  She was at that time felt to have MAT.  She is wearing an event monitor currently.  I have reviewed strips of her monitor.  (she is still wearing).  This has documented afib as well as disorganized atrial activity and runs of atypical flutter/ atach.  Amiodarone previously stopped due to tremor. chads2vasc score is 4.  Her PCP previously stopped her anticoagulation due to minor bleeding.  I have advised anticoagulation with eliquis.  She states "I will talk to my husband and see what he says".   Reduce ASA to 81mg  daily in the interim. For now, I would advise that we continue our current regimen and review her monitor once it is available.  Consider further titration of metoprolol depending on monitor findings.  We could consider starting sotalol 80mg  BID if needed. I would not advise further monitors in the future.  We could consider ILR placement if further arrhythmia management is required.   Pt has f/u in afib clinic (virtual) on 10/16/18.  Called patient to find out if she sensed this and how she is feeling.  Left a message with person who answered.  The patient was laying down and will call back. Will have signed report scanned into patient's chart for review.

## 2018-09-26 NOTE — Telephone Encounter (Signed)
Her low platelets may get better off of aspirin. Anemia looks good by last cbc  I would advise stopping ASA and starting on eliquis.

## 2018-09-27 ENCOUNTER — Telehealth: Payer: Self-pay | Admitting: *Deleted

## 2018-09-27 NOTE — Telephone Encounter (Signed)
Received 2 "serious" [employer] cardiac monitor reports. 6:20 pm, AFib, rate 170, auto triggered 6:26 pm, AFib w/ PVCs (3 in 1 min), rate 130, auto triggered.  Pt reports not feeling well last night and this morning d/t her BP being slightly elevated at 145/81. Pt does report feeling dizzy last night but not sure about the time she felt dizzy. Denies current dizziness/syncope, CP, SOB. Pt made aware to read her MyChart as Dr. Jackalyn Lombard nurse sent a message to discuss medication changes/recommendations.  Pt agreeable and will check today.  Myrtie Hawk, RN, ([facility] nurse), made aware reading will be left in [facility] box in the office.

## 2018-09-28 MED ORDER — APIXABAN 5 MG PO TABS: 5.0000 mg | ORAL_TABLET | Freq: Two times a day (BID) | ORAL | 3 refills | Status: DC

## 2018-09-28 NOTE — Telephone Encounter (Signed)
See mychart thread.  Pt agreed to stop ASA and start Eliquis.

## 2018-10-03 ENCOUNTER — Other Ambulatory Visit: Payer: Self-pay

## 2018-10-16 ENCOUNTER — Encounter (HOSPITAL_COMMUNITY): Payer: Self-pay | Admitting: Physician Assistant

## 2018-10-16 ENCOUNTER — Other Ambulatory Visit (HOSPITAL_COMMUNITY): Payer: Self-pay | Admitting: *Deleted

## 2018-10-16 ENCOUNTER — Other Ambulatory Visit: Payer: Self-pay

## 2018-10-16 ENCOUNTER — Ambulatory Visit (HOSPITAL_COMMUNITY)
Admission: RE | Admit: 2018-10-16 | Discharge: 2018-10-16 | Disposition: A | Discharge status: Home / Self Care | Payer: Medicare Other | Source: Ambulatory Visit | Attending: Physician Assistant | Admitting: Physician Assistant

## 2018-10-16 VITALS — BP 157/88 | HR 76 | Ht <= 58 in

## 2018-10-16 DIAGNOSIS — I480 Paroxysmal atrial fibrillation: Secondary | ICD-10-CM | POA: Diagnosis not present

## 2018-10-16 MED ORDER — METOPROLOL SUCCINATE ER 25 MG PO TB24: ORAL_TABLET | ORAL | 2 refills | Status: DC

## 2018-10-16 NOTE — Progress Notes (Signed)
Electrophysiology TeleHealth Note   Due to national recommendations of social distancing due to [place] 19, Audio/video telehealth visit is felt to be most appropriate for this patient at this time.  See consent below from today for patient consent regarding telehealth for the [facility]. Consent obtained verbally.   Date:  10/16/2018   ID:  [name], DOB [birth year], MRN [id number]  Location: home  Provider location: [address] [place], [place] [address] Evaluation Performed: Follow up  PCP:  [name], DO  Primary Electrophysiologist: Dr Rayann Heman   CC: Follow up for atrial fibrillation   History of Present Illness: [name] is a 73 y.o. female who presents via audio/video conferencing for a telehealth visit today. Patient reports that her cough and SOB has improved since her last visit. This was thought to be 2/2 allergies. She continues to have episodes of palpitations which can last up to 30 minutes. Her cardiac monitor showed 1% AF burden but she also had atrial tachycardia, atrial flutter, and frequent PACs. She also continues to have back pain.  Today, she denies symptoms of chest pain, orthopnea, PND, lower extremity edema, claudication, dizziness, presyncope, syncope, bleeding, or neurologic sequela. The patient is tolerating medications without difficulties and is otherwise without complaint today.   she denies symptoms of cough, fevers, chills, or new SOB worrisome for COVID 19.     Atrial Fibrillation Risk Factors:  she does have symptoms or diagnosis of sleep apnea. she is not compliant with CPAP therapy. she does not have a history of rheumatic fever. she does not have a history of alcohol use.  she has a BMI of Body mass index is 41.12 kg/m..  BP 157/88 Pulse 76 Provided by pt with home BP machine.  Past Medical History:  Diagnosis Date  . Anemia   . Bilateral leg edema   . C. difficile enteritis   . CAD (coronary artery disease)     a. LHC 03/2006: EF 65%, mid LAD 40-50%, ostial D1 70-80%, normal circumflex, normal RCA;  b. Lexiscan Myoview 09/2010: No ischemia or scar, EF 75%;  c. Lex MV 5/14:  Normal, no ischemia, EF 71% // Myoview 08/2018: EF 43, normal perfusion; Intermediate Risk due to low EF (Echo 07/2018 with normal EF)   . Cataract    bilateral-had surgery  . Cellulitis of right leg    Hx - resolved  . Chronic diastolic CHF (congestive heart failure) (HCC)    Echo 8/14:  Mild LVH, EF 55-65%, normal wall motion, Tr AI, mild MR, mod TR, PASP 45 // 12/2014 Echo: EF 60-65%, nl wall motion, mildly dil RA/LA, PASP 42mHg.// Echo 07/2018: EF 60-65, mild LVH, mod diastolic dysfunction, normal RVSF, severe LAE, mod MAC, mild MR, mild TR   . Continuous urine leakage   . [place]atomyositis ([place]   . Deviated septum   . Essential hypertension   . Family history of adverse reaction to anesthesia    .' MY SON IS A DIFFICULT INTUBATION"  . Gall stones    a. 08/2014 Abd U/S:  Large gallstone measuring 4.2 cm.  .Marland KitchenGERD (gastroesophageal reflux disease)   . Gout   . Hiatal hernia   . History of pancreatitis   . History of PFTs    a. PFTs 10/2012: normal.   . Hyperlipidemia   . Hypothyroidism   . Internal hemorrhoids   . Morbid obesity ([place]   . Multifocal atrial tachycardia ([place] 08/10/2018   Noted during admission  in 07/2018  . Osteoarthritis    spine hips knees  . PAF (paroxysmal atrial fibrillation) (HCC)    a. CHA2DS2VASc = 5-->poor OAC candidate 2/2 falls. No problems now per patient 03/2016  . Pneumonia   . Seizures ([place])    as a baby  . Shingles    Hx  . Sleep apnea    Does not use CPAP  . Spinal cord compression ([place]) 02/21/2014   Past Surgical History:  Procedure Laterality Date  . CATARACT EXTRACTION, BILATERAL Bilateral   . COLONOSCOPY  03/2011  . NASAL SEPTUM SURGERY    . TOTAL ABDOMINAL HYSTERECTOMY    . VERTEBROPLASTY  Sept 7 and May 30, 2014   x 2, T11/T12  . WISDOM TOOTH EXTRACTION       Current  Outpatient Medications  Medication Sig Dispense Refill  . acetaminophen (TYLENOL) 500 MG tablet Take 500-1,000 mg by mouth every 6 (six) hours as needed for mild pain or fever.     Marland Kitchen alendronate (FOSAMAX) 70 MG tablet Take 1 tablet by mouth every Tuesday.     Marland Kitchen allopurinol (ZYLOPRIM) 100 MG tablet Take 100 mg by mouth 2 (two) times daily.     Marland Kitchen apixaban (ELIQUIS) 5 MG TABS tablet Take 1 tablet (5 mg total) by mouth 2 (two) times daily. 90 tablet 3  . CALCIUM PO Take 500 mg by mouth daily.    . cetirizine (ZYRTEC) 10 MG tablet Take 10 mg by mouth daily.    . Cholecalciferol (VITAMIN D3) 2000 UNITS TABS Take 2,000 Units by mouth daily.     . folic acid (FOLVITE) 1 MG tablet Take 1 mg by mouth 4 (four) times daily.    . furosemide (LASIX) 40 MG tablet Take 1 tablet (40 mg total) by mouth 2 (two) times daily. 120 tablet 5  . levothyroxine (SYNTHROID, LEVOTHROID) 100 MCG tablet Take 100 mcg by mouth daily.    . methotrexate (RHEUMATREX) 2.5 MG tablet Take 10 mg by mouth every Wednesday.   3  . metoprolol succinate (TOPROL XL) 25 MG 24 hr tablet TAKE 37.5 MG IN THE AM AND 25 MG IN THE PM DAILY BY MOUTH. 225 tablet 3  . Multiple Vitamin (MULTIVITAMIN) tablet Take 1 tablet by mouth daily.      Marland Kitchen nystatin (MYCOSTATIN/NYSTOP) 100000 UNIT/GM POWD Apply 1 g topically 2 (two) times daily. Applies to stomach    . OVER THE COUNTER MEDICATION Place 1 drop into both eyes daily as needed (for dry eyes. Advanced eye relief).    . pantoprazole (PROTONIX) 40 MG tablet TAKE 1 TABLET BY MOUTH 2  TIMES DAILY BEFORE MEAL 180 tablet 0  . phenylephrine-shark liver oil-mineral oil-petrolatum (PREPARATION H) 0.25-3-14-71.9 % rectal ointment Place 1 application rectally as needed for hemorrhoids.    . potassium chloride SA (KLOR-CON M20) 20 MEQ tablet Take 1 tablet (20 mEq total) by mouth 3 (three) times daily. 60 tablet 11  . predniSONE (DELTASONE) 5 MG tablet Take 5 mg by mouth daily with breakfast.    . rosuvastatin  (CRESTOR) 5 MG tablet Take 5 mg by mouth every other day.     . vitamin B-12 (CYANOCOBALAMIN) 1000 MCG tablet Take 1,000 mcg by mouth daily.    . benzonatate (TESSALON) 200 MG capsule Take 1 capsule (200 mg total) by mouth every 8 (eight) hours as needed for cough. 90 capsule 1  . montelukast (SINGULAIR) 10 MG tablet TAKE 1 TABLET BY MOUTH EVERY DAY 90 tablet 0  .  saccharomyces boulardii (FLORASTOR) 250 MG capsule Take 250 mg by mouth daily.     No current facility-administered medications for this encounter.     Allergies:   Butoconazole; Keflex [cephalexin]; and Lipitor [atorvastatin calcium]   Social History:  The patient  reports that she has never smoked. She has never used smokeless tobacco. She reports that she does not drink alcohol or use drugs.   Family History:  The patient's  family history includes Colon cancer in her mother and paternal uncle; Coronary artery disease in her father; Emphysema in her father; Heart attack in her brother; Hypertension in her brother; Kidney disease in her brother.    ROS:  Please see the history of present illness.   All other systems are personally reviewed and negative.   Exam: Well appearing, alert and conversant, regular work of breathing,  good skin color  Recent Labs: 07/25/2018: Hemoglobin 14.3; Magnesium 2.2; Platelets 136 08/28/2018: BUN 19; Creatinine, Ser 1.18; NT-Pro BNP 642; Potassium 3.7; Sodium 147  personally reviewed    Other studies personally reviewed: Additional studies/ records that were reviewed today include: Epic notes, cardiac monitor.    30-day cardiac monitor 10/03/18 Predominant rhythm is sinus rhythm Frequent premature atrial contractions are observed Multiple atrial arrhythmias including atrial tachycardia, atrial flutter and atrial fibrillation with rapid ventricular rates are noted Rare nonsustained ventricular tachycardia No AV block or pauses  ASSESSMENT AND PLAN:  1.  Paroxysmal atrial  fibrillation/atrial tachycardia Patient tolerating Eliquis 5 mg BID without bleeding issues. Will increase Toprol to 37.5 mg BID Patient agreeable to resuming sotalol. Will discuss with Dr Rayann Heman if this could safely be done as an outpatient. If not, will wait until COVID-19 precautions end. Will recheck Bmet/CBC on f/u.  This patients CHA2DS2-VASc Score and unadjusted Ischemic Stroke Rate (% per year) is equal to 4.8 % stroke rate/year from a score of 4  Above score calculated as 1 point each if present [CHF, HTN, DM, Vascular=MI/PAD/Aortic Plaque, Age if 65-74, or Female] Above score calculated as 2 points each if present [Age > 75, or Stroke/TIA/TE]  2. CAD No anginal symptoms. No ischemia on recent stress test. Continue risk factor modification.  3. HTN Elevated today. Med changes as above.  4. Obesity Lifestyle modification was discussed and encouraged including regular physical activity and weight reduction.  5. OSA Pt not using CPAP 2/2 intolerable side effects of "gas bubbles."   COVID screen The patient does not have any symptoms that suggest any further testing/ screening at this time.  Social distancing reinforced today.    Follow-up with AF clinic in 1-2 weeks.  Current medicines are reviewed at length with the patient today.   The patient does not have concerns regarding her medicines.  The following changes were made today:  Increase Toprol  Labs/ tests ordered today include:  No orders of the defined types were placed in this encounter.   Patient Risk:  after full review of this patients clinical status, I feel that they are at moderate risk at this time.   Today, I have spent 25 minutes with the patient with telehealth technology discussing atrial fibrillation, heart monitor results, lifestyle changes, and COVID-19 precautions.    Gwenlyn Perking PA-C 10/16/2018 11:28 AM  Af[facility] [address] [place], [place] [address]  [phone]   I hereby voluntarily request, consent and authorize the [facility] and its employed or contracted physicians, physician assistants, nurse practitioners or other licensed health care professionals (  the Practitioner), to provide me with telemedicine health care services (the "Services") as deemed necessary by the treating Practitioner. I acknowledge and consent to receive the Services by the Practitioner via telemedicine. I understand that the telemedicine visit will involve communicating with the Practitioner through live audiovisual communication technology and the disclosure of certain medical information by electronic transmission. I acknowledge that I have been given the opportunity to request an in-person assessment or other available alternative prior to the telemedicine visit and am voluntarily participating in the telemedicine visit.   I understand that I have the right to withhold or withdraw my consent to the use of telemedicine in the course of my care at any time, without affecting my right to future care or treatment, and that the Practitioner or I may terminate the telemedicine visit at any time. I understand that I have the right to inspect all information obtained and/or recorded in the course of the telemedicine visit and may receive copies of available information for a reasonable fee.  I understand that some of the potential risks of receiving the Services via telemedicine include:   Delay or interruption in medical evaluation due to technological equipment failure or disruption;  Information transmitted may not be sufficient (e.g. poor resolution of images) to allow for appropriate medical decision making by the Practitioner; and/or  In rare instances, security protocols could fail, causing a breach of personal health information.   Furthermore, I acknowledge that it is my responsibility to provide information about my medical history, conditions and care  that is complete and accurate to the best of my ability. I acknowledge that Practitioner's advice, recommendations, and/or decision may be based on factors not within their control, such as incomplete or inaccurate data provided by me or distortions of diagnostic images or specimens that may result from electronic transmissions. I understand that the practice of medicine is not an exact science and that Practitioner makes no warranties or guarantees regarding treatment outcomes. I acknowledge that I will receive a copy of this consent concurrently upon execution via email to the email address I last provided but may also request a printed copy by calling the office of the [facility].  I understand that my insurance will be billed for this visit.   I have read or had this consent read to me.  I understand the contents of this consent, which adequately explains the benefits and risks of the Services being provided via telemedicine.  I have been provided ample opportunity to ask questions regarding this consent and the Services and have had my questions answered to my satisfaction.  I give my informed consent for the services to be provided through the use of telemedicine in my medical care  By participating in this telemedicine visit I agree to the above.

## 2018-10-22 ENCOUNTER — Ambulatory Visit: Payer: Medicare Other | Admitting: Internal Medicine

## 2018-10-25 ENCOUNTER — Ambulatory Visit (HOSPITAL_COMMUNITY)
Admission: RE | Admit: 2018-10-25 | Discharge: 2018-10-25 | Disposition: A | Discharge status: Home / Self Care | Payer: Medicare Other | Source: Ambulatory Visit | Attending: Physician Assistant | Admitting: Physician Assistant

## 2018-10-25 ENCOUNTER — Encounter (HOSPITAL_COMMUNITY): Payer: Self-pay | Admitting: Physician Assistant

## 2018-10-25 ENCOUNTER — Other Ambulatory Visit: Payer: Self-pay

## 2018-10-25 VITALS — BP 126/70 | HR 83 | Temp 98.4°F | Ht <= 58 in | Wt 184.0 lb

## 2018-10-25 DIAGNOSIS — I480 Paroxysmal atrial fibrillation: Secondary | ICD-10-CM

## 2018-10-25 NOTE — Progress Notes (Signed)
Electrophysiology TeleHealth Note   Due to national recommendations of social distancing due to [place] 19, Audio/video telehealth visit is felt to be most appropriate for this patient at this time.  See consent below from today for patient consent regarding telehealth for the [facility]. Consent obtained verbally.   Date:  10/25/2018   ID:  [name], DOB 1946/02/02, MRN [id number]  Location: home  Provider location: [address] [place], [place] [address] Evaluation Performed: Follow up  PCP:  [name], DO  Primary Electrophysiologist: Dr Rayann Heman   CC: Follow up for atrial fibrillation   History of Present Illness: [name] is a 73 y.o. female who presents via audio/video conferencing for a telehealth visit today. Patient reports that she feels better on a higher dose of BB. She is not having as many palpitations and her SOB continues to improve. She does report that she is still not sleeping well at night. She has not used a CPAP machine in several years due to intolerable side effects.  Today, she denies symptoms of chest pain, orthopnea, PND, lower extremity edema, claudication, dizziness, presyncope, syncope, bleeding, or neurologic sequela. The patient is tolerating medications without difficulties and is otherwise without complaint today.   she denies symptoms of cough, fevers, chills, or new SOB worrisome for COVID 19.     Atrial Fibrillation Risk Factors:  she does have symptoms or diagnosis of sleep apnea. she is not compliant with CPAP therapy. she does not have a history of rheumatic fever. she does not have a history of alcohol use.  she has a BMI of Body mass index is 41.25 kg/m..  BP 126/70 Pulse 83 Provided by pt with home BP machine.  Past Medical History:  Diagnosis Date  . Anemia   . Bilateral leg edema   . C. difficile enteritis   . CAD (coronary artery disease)    a. LHC 03/2006: EF 65%, mid LAD 40-50%, ostial D1 70-80%,  normal circumflex, normal RCA;  b. Lexiscan Myoview 09/2010: No ischemia or scar, EF 75%;  c. Lex MV 5/14:  Normal, no ischemia, EF 71% // Myoview 08/2018: EF 43, normal perfusion; Intermediate Risk due to low EF (Echo 07/2018 with normal EF)   . Cataract    bilateral-had surgery  . Cellulitis of right leg    Hx - resolved  . Chronic diastolic CHF (congestive heart failure) (HCC)    Echo 8/14:  Mild LVH, EF 55-65%, normal wall motion, Tr AI, mild MR, mod TR, PASP 45 // 12/2014 Echo: EF 60-65%, nl wall motion, mildly dil RA/LA, PASP 44mHg.// Echo 07/2018: EF 60-65, mild LVH, mod diastolic dysfunction, normal RVSF, severe LAE, mod MAC, mild MR, mild TR   . Continuous urine leakage   . Dermatomyositis ([facility]   . Deviated septum   . Essential hypertension   . Family history of adverse reaction to anesthesia    .' MY SON IS A DIFFICULT INTUBATION"  . Gall stones    a. 08/2014 Abd U/S:  Large gallstone measuring 4.2 cm.  .Marland KitchenGERD (gastroesophageal reflux disease)   . Gout   . Hiatal hernia   . History of pancreatitis   . History of PFTs    a. PFTs 10/2012: normal.   . Hyperlipidemia   . Hypothyroidism   . Internal hemorrhoids   . Morbid obesity ([facility]   . Multifocal atrial tachycardia ([facility] 08/10/2018   Noted during admission in 07/2018  . Osteoarthritis  spine hips knees  . PAF (paroxysmal atrial fibrillation) (HCC)    a. CHA2DS2VASc = 5-->poor OAC candidate 2/2 falls. No problems now per patient 03/2016  . Pneumonia   . Seizures ([place])    as a baby  . Shingles    Hx  . Sleep apnea    Does not use CPAP  . Spinal cord compression ([place]) 02/21/2014   Past Surgical History:  Procedure Laterality Date  . CATARACT EXTRACTION, BILATERAL Bilateral   . COLONOSCOPY  03/2011  . NASAL SEPTUM SURGERY    . TOTAL ABDOMINAL HYSTERECTOMY    . VERTEBROPLASTY  Sept 7 and May 30, 2014   x 2, T11/T12  . WISDOM TOOTH EXTRACTION       Current Outpatient Medications  Medication Sig Dispense Refill  .  acetaminophen (TYLENOL) 500 MG tablet Take 500-1,000 mg by mouth every 6 (six) hours as needed for mild pain or fever.     Marland Kitchen alendronate (FOSAMAX) 70 MG tablet Take 1 tablet by mouth every Tuesday.     Marland Kitchen allopurinol (ZYLOPRIM) 100 MG tablet Take 100 mg by mouth 2 (two) times daily.     Marland Kitchen apixaban (ELIQUIS) 5 MG TABS tablet Take 1 tablet (5 mg total) by mouth 2 (two) times daily. 90 tablet 3  . CALCIUM PO Take 500 mg by mouth daily.    . cetirizine (ZYRTEC) 10 MG tablet Take 10 mg by mouth daily.    . Cholecalciferol (VITAMIN D3) 2000 UNITS TABS Take 2,000 Units by mouth daily.     . famotidine (PEPCID) 20 MG tablet Take 20 mg by mouth daily.    . folic acid (FOLVITE) 1 MG tablet Take 1 mg by mouth 4 (four) times daily.    . furosemide (LASIX) 40 MG tablet Take 1 tablet (40 mg total) by mouth 2 (two) times daily. 120 tablet 5  . levothyroxine (SYNTHROID, LEVOTHROID) 100 MCG tablet Take 100 mcg by mouth daily.    . methotrexate (RHEUMATREX) 2.5 MG tablet Take 10 mg by mouth every Wednesday.   3  . metoprolol succinate (TOPROL XL) 25 MG 24 hr tablet Take 1.5 tablets (37.55m) by mouth twice a day 270 tablet 2  . montelukast (SINGULAIR) 10 MG tablet TAKE 1 TABLET BY MOUTH EVERY DAY 90 tablet 0  . Multiple Vitamin (MULTIVITAMIN) tablet Take 1 tablet by mouth daily.      .Marland Kitchennystatin (MYCOSTATIN/NYSTOP) 100000 UNIT/GM POWD Apply 1 g topically 2 (two) times daily. Applies to stomach    . OVER THE COUNTER MEDICATION Place 1 drop into both eyes daily as needed (for dry eyes. Advanced eye relief).    . phenylephrine-shark liver oil-mineral oil-petrolatum (PREPARATION H) 0.25-3-14-71.9 % rectal ointment Place 1 application rectally as needed for hemorrhoids.    . potassium chloride SA (KLOR-CON M20) 20 MEQ tablet Take 1 tablet (20 mEq total) by mouth 3 (three) times daily. 60 tablet 11  . predniSONE (DELTASONE) 5 MG tablet Take 5 mg by mouth daily with breakfast.    . rosuvastatin (CRESTOR) 5 MG tablet Take 5  mg by mouth every other day.     . vitamin B-12 (CYANOCOBALAMIN) 1000 MCG tablet Take 1,000 mcg by mouth daily.    . benzonatate (TESSALON) 200 MG capsule Take 1 capsule (200 mg total) by mouth every 8 (eight) hours as needed for cough. (Patient not taking: Reported on 10/25/2018) 90 capsule 1   No current facility-administered medications for this encounter.     Allergies:  Butoconazole; Keflex [cephalexin]; and Lipitor [atorvastatin calcium]   Social History:  The patient  reports that she has never smoked. She has never used smokeless tobacco. She reports that she does not drink alcohol or use drugs.   Family History:  The patient's  family history includes Colon cancer in her mother and paternal uncle; Coronary artery disease in her father; Emphysema in her father; Heart attack in her brother; Hypertension in her brother; Kidney disease in her brother.    ROS:  Please see the history of present illness.   All other systems are personally reviewed and negative.   Exam: Well appearing, alert and conversant, regular work of breathing,  good skin color Eyes- anicteric, neuro- grossly intact, skin- no apparent rash or lesions or cyanosis, mouth- oral mucosa is pink   Recent Labs: 07/25/2018: Hemoglobin 14.3; Magnesium 2.2; Platelets 136 08/28/2018: BUN 19; Creatinine, Ser 1.18; NT-Pro BNP 642; Potassium 3.7; Sodium 147  personally reviewed    Other studies personally reviewed: Additional studies/ records that were reviewed today include: Epic notes, cardiac monitor.    30-day cardiac monitor 10/03/18 Predominant rhythm is sinus rhythm Frequent premature atrial contractions are observed Multiple atrial arrhythmias including atrial tachycardia, atrial flutter and atrial fibrillation with rapid ventricular rates are noted Rare nonsustained ventricular tachycardia No AV block or pauses  ASSESSMENT AND PLAN:  1.  Paroxysmal atrial fibrillation/atrial tachycardia Patient improved  symptomatically on higher dose of BB.  We discussed resuming sotalol and patient prefers to continue present therapy for now as she is feeling better. If her symptoms worsen, OK to resume sotalol as outpatient with close f/u per Dr Rayann Heman. Continue Toprol 37.5 mg BID Continue Eliquis 5 mg BID  This patients CHA2DS2-VASc Score and unadjusted Ischemic Stroke Rate (% per year) is equal to 4.8 % stroke rate/year from a score of 4  Above score calculated as 1 point each if present [CHF, HTN, DM, Vascular=MI/PAD/Aortic Plaque, Age if 65-74, or Female] Above score calculated as 2 points each if present [Age > 75, or Stroke/TIA/TE]  2. CAD No anginal symptoms. No ischemia on recent stress test. Continue present therapy and risk factor modification.  3. HTN Stable on higher dose of Toprol. No changes today.  4. Obesity Body mass index is 41.25 kg/m. Lifestyle modification was discussed and encouraged including regular physical activity and weight reduction.  5. OSA Pt not using CPAP 2/2 intolerable side effects of "gas bubbles." Discussed possibility of oral appliance as a treatment and patient is agreeable. Will refer to Dr Ron Parker for evaluation.    COVID screen The patient does not have any symptoms that suggest any further testing/ screening at this time.  Social distancing reinforced today.    Follow-up with AF clinic in 6 weeks.  Current medicines are reviewed at length with the patient today.   The patient does not have concerns regarding her medicines.  The following changes were made today: none  Labs/ tests ordered today include:  No orders of the defined types were placed in this encounter.   Patient Risk:  after full review of this patients clinical status, I feel that they are at moderate risk at this time.   Today, I have spent 24 minutes with the patient with telehealth technology discussing atrial fibrillation, medications, OSA, lifestyle changes, and COVID-19 precautions.     Gwenlyn Perking PA-C 10/25/2018 10:55 AM  [facility] [address] [place], [place] [address] [phone]   I hereby voluntarily request,  consent and authorize the [facility] and its employed or contracted physicians, physician assistants, nurse practitioners or other licensed health care professionals (the Practitioner), to provide me with telemedicine health care services (the "Services") as deemed necessary by the treating Practitioner. I acknowledge and consent to receive the Services by the Practitioner via telemedicine. I understand that the telemedicine visit will involve communicating with the Practitioner through live audiovisual communication technology and the disclosure of certain medical information by electronic transmission. I acknowledge that I have been given the opportunity to request an in-person assessment or other available alternative prior to the telemedicine visit and am voluntarily participating in the telemedicine visit.   I understand that I have the right to withhold or withdraw my consent to the use of telemedicine in the course of my care at any time, without affecting my right to future care or treatment, and that the Practitioner or I may terminate the telemedicine visit at any time. I understand that I have the right to inspect all information obtained and/or recorded in the course of the telemedicine visit and may receive copies of available information for a reasonable fee.  I understand that some of the potential risks of receiving the Services via telemedicine include:   Delay or interruption in medical evaluation due to technological equipment failure or disruption;  Information transmitted may not be sufficient (e.g. poor resolution of images) to allow for appropriate medical decision making by the Practitioner; and/or  In rare instances, security protocols could fail, causing a breach of personal health  information.   Furthermore, I acknowledge that it is my responsibility to provide information about my medical history, conditions and care that is complete and accurate to the best of my ability. I acknowledge that Practitioner's advice, recommendations, and/or decision may be based on factors not within their control, such as incomplete or inaccurate data provided by me or distortions of diagnostic images or specimens that may result from electronic transmissions. I understand that the practice of medicine is not an exact science and that Practitioner makes no warranties or guarantees regarding treatment outcomes. I acknowledge that I will receive a copy of this consent concurrently upon execution via email to the email address I last provided but may also request a printed copy by calling the office of the [facility].  I understand that my insurance will be billed for this visit.   I have read or had this consent read to me.  I understand the contents of this consent, which adequately explains the benefits and risks of the Services being provided via telemedicine.  I have been provided ample opportunity to ask questions regarding this consent and the Services and have had my questions answered to my satisfaction.  I give my informed consent for the services to be provided through the use of telemedicine in my medical care  By participating in this telemedicine visit I agree to the above.

## 2018-10-31 DIAGNOSIS — Z79899 Other long term (current) drug therapy: Secondary | ICD-10-CM | POA: Diagnosis not present

## 2018-10-31 DIAGNOSIS — M150 Primary generalized (osteo)arthritis: Secondary | ICD-10-CM | POA: Diagnosis not present

## 2018-10-31 DIAGNOSIS — M1A00X0 Idiopathic chronic gout, unspecified site, without tophus (tophi): Secondary | ICD-10-CM | POA: Diagnosis not present

## 2018-10-31 DIAGNOSIS — M3390 Dermatopolymyositis, unspecified, organ involvement unspecified: Secondary | ICD-10-CM | POA: Diagnosis not present

## 2018-10-31 DIAGNOSIS — M549 Dorsalgia, unspecified: Secondary | ICD-10-CM | POA: Diagnosis not present

## 2018-11-19 DIAGNOSIS — Z0000 Encounter for general adult medical examination without abnormal findings: Secondary | ICD-10-CM | POA: Diagnosis not present

## 2018-11-19 DIAGNOSIS — Z79899 Other long term (current) drug therapy: Secondary | ICD-10-CM | POA: Diagnosis not present

## 2018-11-19 DIAGNOSIS — E039 Hypothyroidism, unspecified: Secondary | ICD-10-CM | POA: Diagnosis not present

## 2018-11-19 DIAGNOSIS — E785 Hyperlipidemia, unspecified: Secondary | ICD-10-CM | POA: Diagnosis not present

## 2018-11-19 DIAGNOSIS — N183 Chronic kidney disease, stage 3 (moderate): Secondary | ICD-10-CM | POA: Diagnosis not present

## 2018-11-19 DIAGNOSIS — M810 Age-related osteoporosis without current pathological fracture: Secondary | ICD-10-CM | POA: Diagnosis not present

## 2018-11-19 DIAGNOSIS — I10 Essential (primary) hypertension: Secondary | ICD-10-CM | POA: Diagnosis not present

## 2018-11-19 DIAGNOSIS — M3390 Dermatopolymyositis, unspecified, organ involvement unspecified: Secondary | ICD-10-CM | POA: Diagnosis not present

## 2018-11-19 DIAGNOSIS — I482 Chronic atrial fibrillation, unspecified: Secondary | ICD-10-CM | POA: Diagnosis not present

## 2018-11-19 DIAGNOSIS — I2510 Atherosclerotic heart disease of native coronary artery without angina pectoris: Secondary | ICD-10-CM | POA: Diagnosis not present

## 2018-11-23 DIAGNOSIS — H16223 Keratoconjunctivitis sicca, not specified as Sjogren's, bilateral: Secondary | ICD-10-CM | POA: Diagnosis not present

## 2018-11-23 DIAGNOSIS — H04123 Dry eye syndrome of bilateral lacrimal glands: Secondary | ICD-10-CM | POA: Diagnosis not present

## 2018-11-23 DIAGNOSIS — H53002 Unspecified amblyopia, left eye: Secondary | ICD-10-CM | POA: Diagnosis not present

## 2018-11-23 DIAGNOSIS — Z79899 Other long term (current) drug therapy: Secondary | ICD-10-CM | POA: Diagnosis not present

## 2018-12-03 DIAGNOSIS — Z0000 Encounter for general adult medical examination without abnormal findings: Secondary | ICD-10-CM | POA: Diagnosis not present

## 2018-12-03 DIAGNOSIS — N183 Chronic kidney disease, stage 3 (moderate): Secondary | ICD-10-CM | POA: Diagnosis not present

## 2018-12-03 DIAGNOSIS — E039 Hypothyroidism, unspecified: Secondary | ICD-10-CM | POA: Diagnosis not present

## 2018-12-10 ENCOUNTER — Encounter (HOSPITAL_COMMUNITY): Payer: Self-pay

## 2018-12-10 ENCOUNTER — Ambulatory Visit (HOSPITAL_COMMUNITY)
Admission: RE | Admit: 2018-12-10 | Discharge: 2018-12-10 | Disposition: A | Discharge status: Home / Self Care | Payer: Medicare Other | Source: Ambulatory Visit | Attending: Physician Assistant | Admitting: Physician Assistant

## 2018-12-10 ENCOUNTER — Other Ambulatory Visit: Payer: Self-pay

## 2018-12-10 ENCOUNTER — Other Ambulatory Visit (HOSPITAL_COMMUNITY): Payer: Self-pay | Admitting: *Deleted

## 2018-12-10 DIAGNOSIS — I480 Paroxysmal atrial fibrillation: Secondary | ICD-10-CM

## 2018-12-10 MED ORDER — SOTALOL HCL 80 MG PO TABS: 80.0000 mg | ORAL_TABLET | Freq: Two times a day (BID) | ORAL | 1 refills | Status: DC

## 2018-12-10 NOTE — Progress Notes (Signed)
Electrophysiology TeleHealth Note   Due to national recommendations of social distancing due to [place] 19, Audio/video telehealth visit is felt to be most appropriate for this patient at this time.  See consent below from today for patient consent regarding telehealth for the [facility]. Consent obtained verbally.   Date:  12/10/2018   ID:  [name], DOB 1945-08-03, MRN [id number]  Location: home  Provider location: [address] [place], [place] [address] Evaluation Performed: Follow up  PCP:  [name], DO  Primary Electrophysiologist: Dr Rayann Heman   CC: Follow up for atrial fibrillation   History of Present Illness: [name] is a 73 y.o. female who presents via audio/video conferencing for a telehealth visit today. Patient reports that she initially felt better on higher dose of BB but is feeling more SOB recently. She has tried to be more active and notices her fatigue and SOB more acutely. She has not been evaluated for an oral device yet. Recall she is intolerant of CPAP. She also admits to chest/epigastric discomfort after meals and when laying down at night. She has a history of GERD and has seen GI in the past. On her home BP machine her pulse indicator has shown irregular rhythm more often over the last month.   Today, she denies symptoms of orthopnea, PND, lower extremity edema, claudication, dizziness, presyncope, syncope, bleeding, or neurologic sequela. The patient is tolerating medications without difficulties and is otherwise without complaint today.   she denies symptoms of cough, fevers, chills, or new SOB worrisome for COVID 19.     Atrial Fibrillation Risk Factors:  she does have symptoms or diagnosis of sleep apnea. she is not compliant with CPAP therapy. she does not have a history of rheumatic fever. she does not have a history of alcohol use.  she has a BMI of There is no height or weight on file to calculate BMI..  BP 136/101  Pulse 80 Provided by pt with home BP machine.  Past Medical History:  Diagnosis Date  . Anemia   . Bilateral leg edema   . C. difficile enteritis   . CAD (coronary artery disease)    a. LHC 03/2006: EF 65%, mid LAD 40-50%, ostial D1 70-80%, normal circumflex, normal RCA;  b. Lexiscan Myoview 09/2010: No ischemia or scar, EF 75%;  c. Lex MV 5/14:  Normal, no ischemia, EF 71% // Myoview 08/2018: EF 43, normal perfusion; Intermediate Risk due to low EF (Echo 07/2018 with normal EF)   . Cataract    bilateral-had surgery  . Cellulitis of right leg    Hx - resolved  . Chronic diastolic CHF (congestive heart failure) (HCC)    Echo 8/14:  Mild LVH, EF 55-65%, normal wall motion, Tr AI, mild MR, mod TR, PASP 45 // 12/2014 Echo: EF 60-65%, nl wall motion, mildly dil RA/LA, PASP 57mHg.// Echo 07/2018: EF 60-65, mild LVH, mod diastolic dysfunction, normal RVSF, severe LAE, mod MAC, mild MR, mild TR   . Continuous urine leakage   . [place]   . Deviated septum   . Essential hypertension   . Family history of adverse reaction to anesthesia    .' MY SON IS A DIFFICULT INTUBATION"  . Gall stones    a. 08/2014 Abd U/S:  Large gallstone measuring 4.2 cm.  .Marland KitchenGERD (gastroesophageal reflux disease)   . Gout   . Hiatal hernia   . History of pancreatitis   . History of PFTs  a. PFTs 10/2012: normal.   . Hyperlipidemia   . Hypothyroidism   . Internal hemorrhoids   . Morbid obesity ([place])   . Multifocal atrial tachycardia ([place]) 08/10/2018   Noted during admission in 07/2018  . Osteoarthritis    spine hips knees  . PAF (paroxysmal atrial fibrillation) (HCC)    a. CHA2DS2VASc = 5-->poor OAC candidate 2/2 falls. No problems now per patient 03/2016  . Pneumonia   . Seizures ([place])    as a baby  . Shingles    Hx  . Sleep apnea    Does not use CPAP  . Spinal cord compression ([place]) 02/21/2014   Past Surgical History:  Procedure Laterality Date  . CATARACT EXTRACTION, BILATERAL Bilateral   .  COLONOSCOPY  03/2011  . NASAL SEPTUM SURGERY    . TOTAL ABDOMINAL HYSTERECTOMY    . VERTEBROPLASTY  Sept 7 and May 30, 2014   x 2, T11/T12  . WISDOM TOOTH EXTRACTION       Current Outpatient Medications  Medication Sig Dispense Refill  . acetaminophen (TYLENOL) 500 MG tablet Take 500-1,000 mg by mouth every 6 (six) hours as needed for mild pain or fever.     Marland Kitchen alendronate (FOSAMAX) 70 MG tablet Take 1 tablet by mouth every Tuesday.     Marland Kitchen allopurinol (ZYLOPRIM) 100 MG tablet Take 100 mg by mouth 2 (two) times daily.     Marland Kitchen apixaban (ELIQUIS) 5 MG TABS tablet Take 1 tablet (5 mg total) by mouth 2 (two) times daily. 90 tablet 3  . benzonatate (TESSALON) 200 MG capsule Take 1 capsule (200 mg total) by mouth every 8 (eight) hours as needed for cough. 90 capsule 1  . CALCIUM PO Take 500 mg by mouth daily.    . cetirizine (ZYRTEC) 10 MG tablet Take 10 mg by mouth daily.    . Cholecalciferol (VITAMIN D3) 2000 UNITS TABS Take 2,000 Units by mouth daily.     . famotidine (PEPCID) 20 MG tablet Take 20 mg by mouth daily.    . folic acid (FOLVITE) 1 MG tablet Take 1 mg by mouth 4 (four) times daily.    . furosemide (LASIX) 40 MG tablet Take 1 tablet (40 mg total) by mouth 2 (two) times daily. 120 tablet 5  . levothyroxine (SYNTHROID) 88 MCG tablet Take 88 mcg by mouth daily.     . methotrexate (RHEUMATREX) 2.5 MG tablet Take 10 mg by mouth every Wednesday.   3  . metoprolol succinate (TOPROL XL) 25 MG 24 hr tablet Take 1.5 tablets (37.5m) by mouth twice a day 270 tablet 2  . Multiple Vitamin (MULTIVITAMIN) tablet Take 1 tablet by mouth daily.      .Marland Kitchennystatin (MYCOSTATIN/NYSTOP) 100000 UNIT/GM POWD Apply 1 g topically 2 (two) times daily. Applies to stomach    . OVER THE COUNTER MEDICATION Place 1 drop into both eyes daily as needed (for dry eyes. Advanced eye relief).    . phenylephrine-shark liver oil-mineral oil-petrolatum (PREPARATION H) 0.25-3-14-71.9 % rectal ointment Place 1 application rectally  as needed for hemorrhoids.    . potassium chloride SA (KLOR-CON M20) 20 MEQ tablet Take 1 tablet (20 mEq total) by mouth 3 (three) times daily. 60 tablet 11  . predniSONE (DELTASONE) 5 MG tablet Take 5 mg by mouth daily with breakfast.    . rosuvastatin (CRESTOR) 5 MG tablet Take 5 mg by mouth every other day.     . vitamin B-12 (CYANOCOBALAMIN) 1000 MCG tablet Take 1,000 mcg  by mouth daily.     No current facility-administered medications for this encounter.     Allergies:   Butoconazole, Keflex [cephalexin], and Lipitor [atorvastatin calcium]   Social History:  The patient  reports that she has never smoked. She has never used smokeless tobacco. She reports that she does not drink alcohol or use drugs.   Family History:  The patient's  family history includes Colon cancer in her mother and paternal uncle; Coronary artery disease in her father; Emphysema in her father; Heart attack in her brother; Hypertension in her brother; Kidney disease in her brother.    ROS:  Please see the history of present illness.   All other systems are personally reviewed and negative.   Exam: Well appearing, alert and conversant, regular work of breathing,  good skin color Eyes- anicteric, neuro- grossly intact, skin- no apparent rash or lesions or cyanosis, mouth- oral mucosa is pink   Recent Labs: 07/25/2018: Hemoglobin 14.3; Magnesium 2.2; Platelets 136 08/28/2018: BUN 19; Creatinine, Ser 1.18; NT-Pro BNP 642; Potassium 3.7; Sodium 147  personally reviewed    Other studies personally reviewed: Additional studies/ records that were reviewed today include: Epic notes, cardiac monitor.    30-day cardiac monitor 10/03/18 Predominant rhythm is sinus rhythm Frequent premature atrial contractions are observed Multiple atrial arrhythmias including atrial tachycardia, atrial flutter and atrial fibrillation with rapid ventricular rates are noted Rare nonsustained ventricular tachycardia No AV block or pauses   ASSESSMENT AND PLAN:  1.  Paroxysmal atrial fibrillation/atrial tachycardia Patient having more symptoms of fatigue and SOB.   We discussed resuming sotalol and patient is agreeable to starting. Will start sotalol 80 mg BID, follow up for ECG after 3rd dose.  Continue Toprol 37.5 mg BID, may need to decrease this after starting sotalol.  Continue Eliquis 5 mg BID Repeat bmet/mag on f/u  This patients CHA2DS2-VASc Score and unadjusted Ischemic Stroke Rate (% per year) is equal to 4.8 % stroke rate/year from a score of 4  Above score calculated as 1 point each if present [CHF, HTN, DM, Vascular=MI/PAD/Aortic Plaque, Age if 65-74, or Female] Above score calculated as 2 points each if present [Age > 75, or Stroke/TIA/TE]  2. CAD No ischemia on recent stress test. Continue present therapy and risk factor modification.  3. HTN Mildly elevated today. Will adjust HTN medications after starting sotalol.   4. Obesity Lifestyle modification was discussed and encouraged including regular physical activity and weight reduction.  5. OSA Pt not using CPAP 2/2 intolerable side effects of "gas bubbles." Will refer to Dr Ron Parker for evaluation.    COVID screen The patient does not have any symptoms that suggest any further testing/ screening at this time.  Social distancing reinforced today.    Follow-up in office for ECG after starting sotalol.   Current medicines are reviewed at length with the patient today.   The patient does not have concerns regarding her medicines.  The following changes were made today: start sotalol   Labs/ tests ordered today include:  No orders of the defined types were placed in this encounter.   Patient Risk:  after full review of this patients clinical status, I feel that they are at moderate risk at this time.   Today, I have spent 30 minutes with the patient with telehealth technology discussing atrial fibrillation, HTN, and sotalol.    Gwenlyn Perking PA-C 12/10/2018 11:36 AM  [facility] [address] [place], [place] [address] [phone]  I hereby voluntarily request, consent and authorize the [facility] and its employed or contracted physicians, physician assistants, nurse practitioners or other licensed health care professionals (the Practitioner), to provide me with telemedicine health care services (the "Services") as deemed necessary by the treating Practitioner. I acknowledge and consent to receive the Services by the Practitioner via telemedicine. I understand that the telemedicine visit will involve communicating with the Practitioner through live audiovisual communication technology and the disclosure of certain medical information by electronic transmission. I acknowledge that I have been given the opportunity to request an in-person assessment or other available alternative prior to the telemedicine visit and am voluntarily participating in the telemedicine visit.   I understand that I have the right to withhold or withdraw my consent to the use of telemedicine in the course of my care at any time, without affecting my right to future care or treatment, and that the Practitioner or I may terminate the telemedicine visit at any time. I understand that I have the right to inspect all information obtained and/or recorded in the course of the telemedicine visit and may receive copies of available information for a reasonable fee.  I understand that some of the potential risks of receiving the Services via telemedicine include:   Delay or interruption in medical evaluation due to technological equipment failure or disruption;  Information transmitted may not be sufficient (e.g. poor resolution of images) to allow for appropriate medical decision making by the Practitioner; and/or  In rare instances, security protocols could fail, causing a breach of personal health information.    Furthermore, I acknowledge that it is my responsibility to provide information about my medical history, conditions and care that is complete and accurate to the best of my ability. I acknowledge that Practitioner's advice, recommendations, and/or decision may be based on factors not within their control, such as incomplete or inaccurate data provided by me or distortions of diagnostic images or specimens that may result from electronic transmissions. I understand that the practice of medicine is not an exact science and that Practitioner makes no warranties or guarantees regarding treatment outcomes. I acknowledge that I will receive a copy of this consent concurrently upon execution via email to the email address I last provided but may also request a printed copy by calling the office of the [facility].  I understand that my insurance will be billed for this visit.   I have read or had this consent read to me.  I understand the contents of this consent, which adequately explains the benefits and risks of the Services being provided via telemedicine.  I have been provided ample opportunity to ask questions regarding this consent and the Services and have had my questions answered to my satisfaction.  I give my informed consent for the services to be provided through the use of telemedicine in my medical care  By participating in this telemedicine visit I agree to the above.

## 2018-12-12 ENCOUNTER — Ambulatory Visit (HOSPITAL_COMMUNITY)
Admission: RE | Admit: 2018-12-12 | Discharge: 2018-12-12 | Disposition: A | Discharge status: Home / Self Care | Payer: Medicare Other | Source: Ambulatory Visit | Attending: Nurse Practitioner | Admitting: Nurse Practitioner

## 2018-12-12 ENCOUNTER — Encounter (HOSPITAL_COMMUNITY): Payer: Self-pay | Admitting: Physician Assistant

## 2018-12-12 ENCOUNTER — Other Ambulatory Visit: Payer: Self-pay

## 2018-12-12 ENCOUNTER — Other Ambulatory Visit: Payer: Self-pay | Admitting: Pulmonary Disease

## 2018-12-12 VITALS — BP 126/74 | HR 57 | Ht <= 58 in | Wt 192.0 lb

## 2018-12-12 DIAGNOSIS — Z800 Family history of malignant neoplasm of digestive organs: Secondary | ICD-10-CM | POA: Insufficient documentation

## 2018-12-12 DIAGNOSIS — Z8249 Family history of ischemic heart disease and other diseases of the circulatory system: Secondary | ICD-10-CM | POA: Insufficient documentation

## 2018-12-12 DIAGNOSIS — I480 Paroxysmal atrial fibrillation: Secondary | ICD-10-CM | POA: Diagnosis not present

## 2018-12-12 DIAGNOSIS — I5032 Chronic diastolic (congestive) heart failure: Secondary | ICD-10-CM | POA: Diagnosis not present

## 2018-12-12 DIAGNOSIS — M109 Gout, unspecified: Secondary | ICD-10-CM | POA: Insufficient documentation

## 2018-12-12 DIAGNOSIS — Z79890 Hormone replacement therapy (postmenopausal): Secondary | ICD-10-CM | POA: Diagnosis not present

## 2018-12-12 DIAGNOSIS — Z825 Family history of asthma and other chronic lower respiratory diseases: Secondary | ICD-10-CM | POA: Insufficient documentation

## 2018-12-12 DIAGNOSIS — Z7901 Long term (current) use of anticoagulants: Secondary | ICD-10-CM | POA: Diagnosis not present

## 2018-12-12 DIAGNOSIS — I110 Hypertensive heart disease with heart failure: Secondary | ICD-10-CM | POA: Diagnosis not present

## 2018-12-12 DIAGNOSIS — Z6841 Body Mass Index (BMI) 40.0 and over, adult: Secondary | ICD-10-CM | POA: Diagnosis not present

## 2018-12-12 DIAGNOSIS — G4733 Obstructive sleep apnea (adult) (pediatric): Secondary | ICD-10-CM | POA: Insufficient documentation

## 2018-12-12 DIAGNOSIS — Z888 Allergy status to other drugs, medicaments and biological substances status: Secondary | ICD-10-CM | POA: Diagnosis not present

## 2018-12-12 DIAGNOSIS — Z841 Family history of disorders of kidney and ureter: Secondary | ICD-10-CM | POA: Insufficient documentation

## 2018-12-12 DIAGNOSIS — Z7983 Long term (current) use of bisphosphonates: Secondary | ICD-10-CM | POA: Diagnosis not present

## 2018-12-12 DIAGNOSIS — Z79899 Other long term (current) drug therapy: Secondary | ICD-10-CM | POA: Diagnosis not present

## 2018-12-12 DIAGNOSIS — K219 Gastro-esophageal reflux disease without esophagitis: Secondary | ICD-10-CM | POA: Insufficient documentation

## 2018-12-12 DIAGNOSIS — Z881 Allergy status to other antibiotic agents status: Secondary | ICD-10-CM | POA: Insufficient documentation

## 2018-12-12 DIAGNOSIS — I2510 Atherosclerotic heart disease of native coronary artery without angina pectoris: Secondary | ICD-10-CM | POA: Insufficient documentation

## 2018-12-12 DIAGNOSIS — I4891 Unspecified atrial fibrillation: Secondary | ICD-10-CM | POA: Diagnosis present

## 2018-12-12 LAB — CBC
HCT: 44.2 % (ref 36.0–46.0)
Hemoglobin: 14.5 g/dL (ref 12.0–15.0)
MCH: 35 pg — ABNORMAL HIGH (ref 26.0–34.0)
MCHC: 32.8 g/dL (ref 30.0–36.0)
MCV: 106.8 fL — ABNORMAL HIGH (ref 80.0–100.0)
Platelets: 166 10*3/uL (ref 150–400)
RBC: 4.14 MIL/uL (ref 3.87–5.11)
RDW: 14.6 % (ref 11.5–15.5)
WBC: 9.1 10*3/uL (ref 4.0–10.5)
nRBC: 0 % (ref 0.0–0.2)

## 2018-12-12 LAB — BASIC METABOLIC PANEL
Anion gap: 9 (ref 5–15)
BUN: 21 mg/dL (ref 8–23)
CO2: 28 mmol/L (ref 22–32)
Calcium: 9.6 mg/dL (ref 8.9–10.3)
Chloride: 105 mmol/L (ref 98–111)
Creatinine, Ser: 1.2 mg/dL — ABNORMAL HIGH (ref 0.44–1.00)
GFR calc Af Amer: 52 mL/min — ABNORMAL LOW (ref >60)
GFR calc non Af Amer: 45 mL/min — ABNORMAL LOW (ref >60)
Glucose, Bld: 107 mg/dL — ABNORMAL HIGH (ref 70–99)
Potassium: 4.5 mmol/L (ref 3.5–5.1)
Sodium: 142 mmol/L (ref 135–145)

## 2018-12-12 LAB — MAGNESIUM: Magnesium: 2.3 mg/dL (ref 1.7–2.4)

## 2018-12-12 NOTE — Progress Notes (Signed)
Primary Care Physician: [name], DO Primary Cardiologist: none Primary Electrophysiologist: Dr Rayann Heman Referring Physician: Dr Lorne Skeens is a 73 y.o. female with a history of CAD, chronic HFpEF, HTN, MAT, OSA, and paroxysmal atrial fibrillation who presents for follow up in the [facility]. Patient started back on sotalol two days ago. She reports that she felt like she was in SR this morning and is in SR at her visit today. She does report some improvement in her fatigue and SOB.   Today, she denies symptoms of palpitations, chest pain, shortness of breath, orthopnea, PND, lower extremity edema, dizziness, presyncope, syncope, snoring, daytime somnolence, bleeding, or neurologic sequela. The patient is tolerating medications without difficulties and is otherwise without complaint today.    Atrial Fibrillation Risk Factors:  she does have symptoms or diagnosis of sleep apnea. she is not compliant with CPAP therapy. she does not have a history of rheumatic fever. she does not have a history of alcohol use.   she has a BMI of Body mass index is 43.05 kg/m.Marland Kitchen Filed Weights   12/12/18 1129  Weight: 87.1 kg    Family History  Problem Relation Age of Onset  . Colon cancer Mother        mets, spot on lungs and spine  . Coronary artery disease Father   . Emphysema Father   . Heart attack Brother        x 2  . Colon cancer Paternal Uncle   . Kidney disease Brother   . Hypertension Brother   . Stroke Neg Hx   . Esophageal cancer Neg Hx      Atrial Fibrillation Management history:  Previous antiarrhythmic drugs: amiodarone (stopped 2/2 tremor), sotalol Previous cardioversions: none Previous ablations: none CHADS2VASC score: 4 Anticoagulation history: Eliquis   Past Medical History:  Diagnosis Date  . Anemia   . Bilateral leg edema   . C. difficile enteritis   . CAD (coronary artery disease)    a. LHC 03/2006: EF 65%, mid  LAD 40-50%, ostial D1 70-80%, normal circumflex, normal RCA;  b. Lexiscan Myoview 09/2010: No ischemia or scar, EF 75%;  c. Lex MV 5/14:  Normal, no ischemia, EF 71% // Myoview 08/2018: EF 43, normal perfusion; Intermediate Risk due to low EF (Echo 07/2018 with normal EF)   . Cataract    bilateral-had surgery  . Cellulitis of right leg    Hx - resolved  . Chronic diastolic CHF (congestive heart failure) (HCC)    Echo 8/14:  Mild LVH, EF 55-65%, normal wall motion, Tr AI, mild MR, mod TR, PASP 45 // 12/2014 Echo: EF 60-65%, nl wall motion, mildly dil RA/LA, PASP 72mHg.// Echo 07/2018: EF 60-65, mild LVH, mod diastolic dysfunction, normal RVSF, severe LAE, mod MAC, mild MR, mild TR   . Continuous urine leakage   . [place] ([place]   . Deviated septum   . Essential hypertension   . Family history of adverse reaction to anesthesia    .' MY SON IS A DIFFICULT INTUBATION"  . Gall stones    a. 08/2014 Abd U/S:  Large gallstone measuring 4.2 cm.  .Marland KitchenGERD (gastroesophageal reflux disease)   . Gout   . Hiatal hernia   . History of pancreatitis   . History of PFTs    a. PFTs 10/2012: normal.   . Hyperlipidemia   . Hypothyroidism   . Internal hemorrhoids   . Morbid [place] ([place]   .  Multifocal atrial tachycardia ([place]) 08/10/2018   Noted during admission in 07/2018  . Osteoarthritis    spine hips knees  . PAF (paroxysmal atrial fibrillation) (HCC)    a. CHA2DS2VASc = 5-->poor OAC candidate 2/2 falls. No problems now per patient 03/2016  . Pneumonia   . Seizures ([place])    as a baby  . Shingles    Hx  . Sleep apnea    Does not use CPAP  . Spinal cord compression ([place]) 02/21/2014   Past Surgical History:  Procedure Laterality Date  . CATARACT EXTRACTION, BILATERAL Bilateral   . COLONOSCOPY  03/2011  . NASAL SEPTUM SURGERY    . TOTAL ABDOMINAL HYSTERECTOMY    . VERTEBROPLASTY  Sept 7 and May 30, 2014   x 2, T11/T12  . WISDOM TOOTH EXTRACTION      Current Outpatient Medications   Medication Sig Dispense Refill  . acetaminophen (TYLENOL) 500 MG tablet Take 500-1,000 mg by mouth every 6 (six) hours as needed for mild pain or fever.     Marland Kitchen alendronate (FOSAMAX) 70 MG tablet Take 1 tablet by mouth every Tuesday.     Marland Kitchen allopurinol (ZYLOPRIM) 100 MG tablet Take 100 mg by mouth 2 (two) times daily.     Marland Kitchen apixaban (ELIQUIS) 5 MG TABS tablet Take 1 tablet (5 mg total) by mouth 2 (two) times daily. 90 tablet 3  . CALCIUM PO Take 500 mg by mouth daily.    . cetirizine (ZYRTEC) 10 MG tablet Take 10 mg by mouth daily.    . Cholecalciferol (VITAMIN D3) 2000 UNITS TABS Take 2,000 Units by mouth daily.     . famotidine (PEPCID) 20 MG tablet Take 20 mg by mouth daily.    . folic acid (FOLVITE) 1 MG tablet Take 1 mg by mouth 4 (four) times daily.    . furosemide (LASIX) 40 MG tablet Take 1 tablet (40 mg total) by mouth 2 (two) times daily. 120 tablet 5  . levothyroxine (SYNTHROID) 88 MCG tablet Take 88 mcg by mouth daily.     . methotrexate (RHEUMATREX) 2.5 MG tablet Take 10 mg by mouth every Wednesday.   3  . metoprolol succinate (TOPROL XL) 25 MG 24 hr tablet Take 1.5 tablets (37.71m) by mouth twice a day 270 tablet 2  . Multiple Vitamin (MULTIVITAMIN) tablet Take 1 tablet by mouth daily.      .Marland KitchenOVER THE COUNTER MEDICATION Place 1 drop into both eyes daily as needed (for dry eyes. Advanced eye relief).    . phenylephrine-shark liver oil-mineral oil-petrolatum (PREPARATION H) 0.25-3-14-71.9 % rectal ointment Place 1 application rectally as needed for hemorrhoids.    . potassium chloride SA (KLOR-CON M20) 20 MEQ tablet Take 1 tablet (20 mEq total) by mouth 3 (three) times daily. 60 tablet 11  . predniSONE (DELTASONE) 5 MG tablet Take 5 mg by mouth daily with breakfast.    . rosuvastatin (CRESTOR) 5 MG tablet Take 5 mg by mouth every other day.     . sotalol (BETAPACE) 80 MG tablet Take 1 tablet (80 mg total) by mouth 2 (two) times daily. 60 tablet 1  . vitamin B-12 (CYANOCOBALAMIN) 1000  MCG tablet Take 1,000 mcg by mouth daily.     No current facility-administered medications for this encounter.     Allergies  Allergen Reactions  . Butoconazole Itching, Rash and Other (See Comments)    'Redness and swelling feverish ' 'worse symptoms '  . Keflex [Cephalexin] Diarrhea  . Lipitor [[employer]  Calcium] Other (See Comments)    'Muscle weakness'      Social History   Socioeconomic History  . Marital status: Married    Spouse name: Not on file  . Number of children: 2  . Years of education: Not on file  . Highest education level: Not on file  Occupational History    Employer: UNEMPLOYED  Social Needs  . Financial resource strain: Not on file  . Food insecurity    Worry: Not on file    Inability: Not on file  . Transportation needs    Medical: Not on file    Non-medical: Not on file  Tobacco Use  . Smoking status: Never Smoker  . Smokeless tobacco: Never Used  Substance and Sexual Activity  . Alcohol use: No  . Drug use: No  . Sexual activity: Not Currently    Birth control/protection: Post-menopausal  Lifestyle  . Physical activity    Days per week: Not on file    Minutes per session: Not on file  . Stress: Not on file  Relationships  . Social Herbalist on phone: Not on file    Gets together: Not on file    Attends religious service: Not on file    Active member of club or organization: Not on file    Attends meetings of clubs or organizations: Not on file    Relationship status: Not on file  . Intimate partner violence    Fear of current or ex partner: Not on file    Emotionally abused: Not on file    Physically abused: Not on file    Forced sexual activity: Not on file  Other Topics Concern  . Not on file  Social History Narrative  . Not on file     ROS- All systems are reviewed and negative except as per the HPI above.  Physical Exam: Vitals:   12/12/18 1129  Weight: 87.1 kg  Height: _0  (1.422 m)    GEN- The  patient is well appearing obese female, alert and oriented x 3 today.   Head- normocephalic, atraumatic Eyes-  Sclera clear, conjunctiva pink Ears- hearing intact Oropharynx- clear Neck- supple  Lungs- Clear to ausculation bilaterally, normal work of breathing Heart- Regular rate and rhythm, no murmurs, rubs or gallops  GI- soft, NT, ND, + BS Extremities- no clubbing, cyanosis. Non-pitting edema MS- no significant deformity or atrophy Skin- no rash or lesion Psych- euthymic mood, full affect Neuro- strength and sensation are intact  Wt Readings from Last 3 Encounters:  12/12/18 87.1 kg  10/25/18 83.5 kg  09/19/18 83.2 kg    EKG today demonstrates SR HR 57, NST, PR 164, QRS 72, QTc 439  Echo 07/26/18 demonstrated  1. The left ventricle has normal systolic function with an ejection fraction of 60-65%. The cavity size was normal. There is mildly increased left ventricular wall thickness. Left ventricular diastolic Doppler parameters are consistent with  pseudonormalization.  2. The right ventricle has normal systolic function. The cavity was normal. There is no increase in right ventricular wall thickness.  3. Left atrial size was severely dilated.  4. The mitral valve is normal in structure. There is mild thickening. There is moderate mitral annular calcification present.  5. The tricuspid valve is normal in structure.  6. The aortic valve is tricuspid There is mild thickening of the aortic valve.  7. The pulmonic valve was normal in structure.  8. Normal LV systolic function; mild LVH;  moderate diastolic dysfunction; moderate LAE; mild MR and TR.  9. Right atrial pressure is estimated at 3 mmHg.  Epic records are reviewed at length today  Assessment and Plan:  1. Paroxysmal atrial fibrillation Recently started back on sotalol. She is in SR today. Continue sotalol 80 mg BID, QT stable. Check Bmet/mag/CBC Continue Eliquis 5 mg BID We discussed that if she continues to have  issues with palpitations we could think about a ILR. Patient open the the suggestion if she has further issues.  This patients CHA2DS2-VASc Score and unadjusted Ischemic Stroke Rate (% per year) is equal to 4.8 % stroke rate/year from a score of 4  Above score calculated as 1 point each if present [CHF, HTN, DM, Vascular=MI/PAD/Aortic Plaque, Age if 65-74, or Female] Above score calculated as 2 points each if present [Age > 75, or Stroke/TIA/TE]   2. Obesity Body mass index is 43.05 kg/m. Lifestyle modification was discussed and encouraged including regular physical activity and weight reduction.  3. Obstructive sleep apnea The importance of adequate treatment of sleep apnea was discussed today in order to improve our ability to maintain sinus rhythm long term. Intolerant of CPAP. Referral to Dr Ron Parker pending.  4. CAD No anginal symptoms. Continue risk factor modification.  5. HTN Stable, no changes today.   Follow up with Dr Rayann Heman in 3 months.   [facility] [address] [place], [place] [address] [phone] 12/12/2018 11:35 AM

## 2018-12-13 ENCOUNTER — Telehealth (HOSPITAL_COMMUNITY): Payer: Self-pay | Admitting: *Deleted

## 2018-12-13 NOTE — Telephone Encounter (Signed)
Patient states after her appointment yesterday she went back into AF she has returned to rhythm this morning. Concerned the sotalol is not working. Instructed pt to monitor over weekend - if still going in and out frequently to let me know and I will further discuss with Adline Peals, PA.

## 2018-12-17 NOTE — Telephone Encounter (Signed)
Pt called in stating she has been in AF for the last 3 days. She is miserable, nauseated and shaky. BP this Am was 165/97 HR 70 after meds 127/56 HR 61. Adline Peals PA discussed case with Dr.Allred recommends placement of ILR to further evaluated arrhythmias prior to treatment changes. Pt in agreement. Will await scheduling direction from Dr. Jackalyn Lombard RN to place ILR.

## 2018-12-19 ENCOUNTER — Telehealth: Payer: Self-pay

## 2018-12-20 NOTE — Telephone Encounter (Signed)
Pt scheduled for loop implant 12/21/2018 at 9:00 am for paf and palpitations.

## 2018-12-21 ENCOUNTER — Other Ambulatory Visit: Payer: Self-pay

## 2018-12-21 ENCOUNTER — Ambulatory Visit (INDEPENDENT_AMBULATORY_CARE_PROVIDER_SITE_OTHER): Payer: Medicare Other | Admitting: Internal Medicine

## 2018-12-21 ENCOUNTER — Encounter: Payer: Self-pay | Admitting: Internal Medicine

## 2018-12-21 VITALS — BP 138/72 | Ht <= 58 in | Wt 192.0 lb

## 2018-12-21 DIAGNOSIS — R002 Palpitations: Secondary | ICD-10-CM

## 2018-12-21 DIAGNOSIS — I480 Paroxysmal atrial fibrillation: Secondary | ICD-10-CM

## 2018-12-21 HISTORY — PX: OTHER SURGICAL HISTORY: SHX169

## 2018-12-21 NOTE — Patient Instructions (Addendum)
You may remove the large outer dressing tomorrow If you have oozing from your device site, apply firm pressure for 10 minutes and it should stop. If continued oozing occurs, keep applying pressure.   Do NOT go to the ER for oozing from your device site  We will do a virtual wound check appointment on 01/03/19 at [facility].  Please send a picture of your device site through the MyChart patient portal anytime that day.   Call the device clinic at [phone] for any issues or concerns.

## 2018-12-21 NOTE — Progress Notes (Signed)
PCP: [name], DO   Primary EP: Dr Rayann Heman  [name] is a 73 y.o. female who presents today for routine electrophysiology followup.  Since last being seen in our clinic, the patient reports doing very well.  She has a h/o afib.  There is uncertainty regarding her AF symptoms.  (She feels that she is in afib "all the time", though recent office ekgs have documented sinus).  I agree with Adline Peals PAC that further characterization of her AF burden is necessary to appropriately manage her afib. She has palpitations, SOB and fatigue chronically.  Today, she denies symptoms of chest pain,  lower extremity edema, dizziness, presyncope, or syncope.  The patient is otherwise without complaint today.   Past Medical History:  Diagnosis Date  . Anemia   . Bilateral leg edema   . C. difficile enteritis   . CAD (coronary artery disease)    a. LHC 03/2006: EF 65%, mid LAD 40-50%, ostial D1 70-80%, normal circumflex, normal RCA;  b. Lexiscan Myoview 09/2010: No ischemia or scar, EF 75%;  c. Lex MV 5/14:  Normal, no ischemia, EF 71% // Myoview 08/2018: EF 43, normal perfusion; Intermediate Risk due to low EF (Echo 07/2018 with normal EF)   . Cataract    bilateral-had surgery  . Cellulitis of right leg    Hx - resolved  . Chronic diastolic CHF (congestive heart failure) (HCC)    Echo 8/14:  Mild LVH, EF 55-65%, normal wall motion, Tr AI, mild MR, mod TR, PASP 45 // 12/2014 Echo: EF 60-65%, nl wall motion, mildly dil RA/LA, PASP 53mHg.// Echo 07/2018: EF 60-65, mild LVH, mod diastolic dysfunction, normal RVSF, severe LAE, mod MAC, mild MR, mild TR   . Continuous urine leakage   . Dermatomyositis ([place]   . Deviated septum   . Essential hypertension   . Family history of adverse reaction to anesthesia    .' MY SON IS A DIFFICULT INTUBATION"  . Gall stones    a. 08/2014 Abd U/S:  Large gallstone measuring 4.2 cm.  .Marland KitchenGERD (gastroesophageal reflux disease)   . Gout   . Hiatal hernia   . History of  pancreatitis   . History of PFTs    a. PFTs 10/2012: normal.   . Hyperlipidemia   . Hypothyroidism   . Internal hemorrhoids   . Morbid obesity (HC[place]   . Multifocal [place] tachycardia ([place] 08/10/2018   Noted during admission in 07/2018  . Osteoarthritis    spine hips knees  . PAF (paroxysmal atrial fibrillation) (HCC)    a. CHA2DS2VASc = 5-->poor OAC candidate 2/2 falls. No problems now per patient 03/2016  . Pneumonia   . Seizures ([facility]    as a baby  . Shingles    Hx  . Sleep apnea    Does not use CPAP  . Spinal cord compression ([facility] 02/21/2014   Past Surgical History:  Procedure Laterality Date  . CATARACT EXTRACTION, BILATERAL Bilateral   . COLONOSCOPY  03/2011  . NASAL SEPTUM SURGERY    . TOTAL ABDOMINAL HYSTERECTOMY    . VERTEBROPLASTY  Sept 7 and May 30, 2014   x 2, T11/T12  . WISDOM TOOTH EXTRACTION      ROS- all systems are reviewed and negatives except as per HPI above  Current Outpatient Medications  Medication Sig Dispense Refill  . acetaminophen (TYLENOL) 500 MG tablet Take 500-1,000 mg by mouth every 6 (six) hours as needed for mild pain or fever.     .Marland Kitchen  alendronate (FOSAMAX) 70 MG tablet Take 1 tablet by mouth every Tuesday.     Marland Kitchen allopurinol (ZYLOPRIM) 100 MG tablet Take 100 mg by mouth 2 (two) times daily.     Marland Kitchen apixaban (ELIQUIS) 5 MG TABS tablet Take 1 tablet (5 mg total) by mouth 2 (two) times daily. 90 tablet 3  . CALCIUM PO Take 500 mg by mouth daily.    . cetirizine (ZYRTEC) 10 MG tablet Take 10 mg by mouth daily.    . Cholecalciferol (VITAMIN D3) 2000 UNITS TABS Take 2,000 Units by mouth daily.     . famotidine (PEPCID) 20 MG tablet Take 20 mg by mouth daily.    . folic acid (FOLVITE) 1 MG tablet Take 1 mg by mouth 4 (four) times daily.    . furosemide (LASIX) 40 MG tablet Take 1 tablet (40 mg total) by mouth 2 (two) times daily. 120 tablet 5  . levothyroxine (SYNTHROID) 88 MCG tablet Take 88 mcg by mouth daily.     . methotrexate (RHEUMATREX) 2.5 MG  tablet Take 10 mg by mouth every Wednesday.   3  . metoprolol succinate (TOPROL XL) 25 MG 24 hr tablet Take 1.5 tablets (37.21m) by mouth twice a day 270 tablet 2  . Multiple Vitamin (MULTIVITAMIN) tablet Take 1 tablet by mouth daily.      .Marland KitchenOVER THE COUNTER MEDICATION Place 1 drop into both eyes daily as needed (for dry eyes. Advanced eye relief).    . phenylephrine-shark liver oil-mineral oil-petrolatum (PREPARATION H) 0.25-3-14-71.9 % rectal ointment Place 1 application rectally as needed for hemorrhoids.    . potassium chloride SA (KLOR-CON M20) 20 MEQ tablet Take 1 tablet (20 mEq total) by mouth 3 (three) times daily. 60 tablet 11  . predniSONE (DELTASONE) 5 MG tablet Take 5 mg by mouth daily with breakfast.    . rosuvastatin (CRESTOR) 5 MG tablet Take 5 mg by mouth every other day.     . sotalol (BETAPACE) 80 MG tablet Take 1 tablet (80 mg total) by mouth 2 (two) times daily. 60 tablet 1  . vitamin B-12 (CYANOCOBALAMIN) 1000 MCG tablet Take 1,000 mcg by mouth daily.     No current facility-administered medications for this visit.     Physical Exam: Vitals:   12/21/18 0902  BP: 138/72  Weight: 192 lb (87.1 kg)  Height: _0  (1.422 m)  HR 65  GEN- The patient is chronically ill and overweight appearing, alert and oriented x 3 today.   Head- normocephalic, atraumatic Eyes-  Sclera clear, conjunctiva pink Ears- hearing intact Oropharynx- clear Lungs- Clear to ausculation bilaterally, normal work of breathing Heart- Regular rate and rhythm, no murmurs, rubs or gallops, PMI not laterally displaced GI- soft, NT, ND, + BS Extremities- no clubbing, cyanosis, + dependant edema  Wt Readings from Last 3 Encounters:  12/21/18 192 lb (87.1 kg)  12/12/18 192 lb (87.1 kg)  10/25/18 184 lb (83.5 kg)    Assessment and Plan:  1. Paroxysmal atrial fibrillation  The patient has had difficult to manage atrial fibrillation with frequent SOB and fatigue.    she has worn telemetry previously  but is felt to require long term monitoring for appropriate afib management.  There is significant concern for possible atrial fibrillation as the cause for the SOB and palpitations.  In addition long term afib management is felt to require additional monitoring.   chadsvasc score is 4  2. Morbid obesity Lifestyle modification is encouraged  3. OSA She cannot  tolerate CPAP Referral to Dr Ron Parker is pending  4. HTN Stable No change required today   [name] MD, [facility] 12/21/2018 9:24 AM      ILR implant:  Informed written consent was obtained.  The patient required no sedation for the procedure today.  Mapping over the patient's chest was performed to identify the area where electrograms were most prominent for ILR recording.  This area was found to be the left parasternal region over the 3rd-4th intercostal space. The patients left chest was therefore prepped and draped in the usual sterile fashion. The skin overlying the left parasternal region was infiltrated with lidocaine for local analgesia.  A 0.5-cm incision was made over the left parasternal region over the 3rd intercostal space.  A subcutaneous ILR pocket was fashioned using a combination of sharp and blunt dissection.  A Medtronic Reveal Linq model [id number] (SN [id number]) implantable loop recorder was then placed into the pocket  R waves were very prominent and measured >0.42m.  Steri- Strips and a sterile dressing were then applied.  There were no early apparent complications.     CONCLUSIONS:   1. Successful implantation of a Medtronic Reveal LINQ implantable loop recorder for palpitations and recurrent symptoms of atrial fibrillation  2. No early apparent complications.   JThompson GrayerMD, FNiagara Falls [facility] Center7/03/2019 9:46 AM

## 2018-12-31 ENCOUNTER — Telehealth: Payer: Self-pay

## 2018-12-31 NOTE — Telephone Encounter (Signed)
Pt states her monitor moves. She states she feels like it is on the device is on a nerve. I asked the pt is she sleeping on the side where her device is implanted? She states she does sometimes. I told her I will let her talk with the device nurse.

## 2018-12-31 NOTE — Telephone Encounter (Signed)
Spoke to pt regarding her concern, informed her that it is okay if the ILR moves around a little bit. She complained of pain when laying in a certain position, denies any other symptoms. Pt verbalized understanding.

## 2019-01-03 ENCOUNTER — Telehealth: Payer: Self-pay | Admitting: Internal Medicine

## 2019-01-03 ENCOUNTER — Ambulatory Visit (INDEPENDENT_AMBULATORY_CARE_PROVIDER_SITE_OTHER): Payer: Medicare Other | Admitting: *Deleted

## 2019-01-03 ENCOUNTER — Other Ambulatory Visit: Payer: Self-pay

## 2019-01-03 DIAGNOSIS — I480 Paroxysmal atrial fibrillation: Secondary | ICD-10-CM

## 2019-01-03 NOTE — Progress Notes (Signed)
Patient reports no edema , redness or drainage from wound site. Questions answered and remote transmissions received. Next home remote 01/23/19 and f/u with Dr Rayann Heman on 03/25/19. Education on s/sx of infection and remote transmissions.

## 2019-01-03 NOTE — Telephone Encounter (Signed)
See encounter note.

## 2019-01-23 ENCOUNTER — Ambulatory Visit (INDEPENDENT_AMBULATORY_CARE_PROVIDER_SITE_OTHER): Payer: Medicare Other | Admitting: *Deleted

## 2019-01-23 DIAGNOSIS — I480 Paroxysmal atrial fibrillation: Secondary | ICD-10-CM

## 2019-01-23 LAB — CUP PACEART REMOTE DEVICE CHECK
Date Time Interrogation Session: 20200812133908
Implantable Pulse Generator Implant Date: 20200710

## 2019-01-24 ENCOUNTER — Telehealth: Payer: Self-pay | Admitting: Internal Medicine

## 2019-01-24 NOTE — Telephone Encounter (Signed)
Pt called because she needed to speak with a Nurse before she get a refill because states that she is having real bad heartburn and diarrhea, when taking this medication Sotalol. Pt would like a call back ASAP. Please address

## 2019-01-24 NOTE — Telephone Encounter (Signed)
Called patient. She reports since taking sotalol about an hour after she takes it she has acid reflux and diarrhea. She wants her care team aware. I will let Dr. Rayann Heman and Richardson Dopp know. She just wanted them to be aware and make recommendations as needed.

## 2019-01-24 NOTE — Telephone Encounter (Signed)
°*  STAT* If patient is at the pharmacy, call can be transferred to refill team.   1. Which medications need to be refilled? (please list name of each medication and dose if known) Sotalol 80 mg  2. Which pharmacy/location (including street and city if local pharmacy) is medication to be sent to? [facility] [address] 3. Do they need a 30 day or 90 day supply? [place]

## 2019-01-25 NOTE — Telephone Encounter (Signed)
I will have to defer to Dr. Rayann Heman and Mr. Marlene Lard. Richardson Dopp, PA-C    01/25/2019 1:50 PM

## 2019-01-28 ENCOUNTER — Other Ambulatory Visit (HOSPITAL_COMMUNITY): Payer: Self-pay | Admitting: Physician Assistant

## 2019-01-28 NOTE — Telephone Encounter (Signed)
Talked with patient she will try taking the sotalol with more food and take her pepcid several hours in advance of the sotalol. Patient has had issues with diarrhea on and off for years - she will call her GI doctor regarding this. Pt will continue current medications for now.

## 2019-01-28 NOTE — Telephone Encounter (Signed)
Somewhat common -Diarrhea (5-6%), indigestion 2-6%

## 2019-01-29 ENCOUNTER — Telehealth: Payer: Self-pay | Admitting: *Deleted

## 2019-01-29 DIAGNOSIS — H43813 Vitreous degeneration, bilateral: Secondary | ICD-10-CM | POA: Diagnosis not present

## 2019-01-29 DIAGNOSIS — Z79899 Other long term (current) drug therapy: Secondary | ICD-10-CM | POA: Diagnosis not present

## 2019-01-29 DIAGNOSIS — H35383 Toxic maculopathy, bilateral: Secondary | ICD-10-CM | POA: Diagnosis not present

## 2019-01-29 DIAGNOSIS — H35372 Puckering of macula, left eye: Secondary | ICD-10-CM | POA: Diagnosis not present

## 2019-01-29 NOTE — Telephone Encounter (Signed)
Spoke with patient. Confirmed compliance with cardiac medications, including sotalol, Toprol XL, and Eliquis. Reports she was watching TV yesterday morning when she noted the chest tightness and "heart quivering," discomfort dissipated after 67min, no other associated symptoms. Advised pt to call our office if symptoms occur more frequently. ED precautions given for any worsening or sustained symptoms. Routed to Dr. Rayann Heman for review. 91 day post-implant f/u with Dr. Rayann Heman scheduled for 03/25/19 at 11:45.

## 2019-01-29 NOTE — Telephone Encounter (Addendum)
Called patient regarding symptom episode from 01/28/19 at 09:03. ECG suggests runs of AF with variable rates. Pt reports prior to using her symptom activator, she noted chest tightness radiating to her left shoulder, episode lasted about 68min. Reports it was not the same as her reflux/heartburn that she reported on 01/24/19. Pt requests call back in a few minutes to review med list and discuss episode further.

## 2019-01-31 DIAGNOSIS — M3390 Dermatopolymyositis, unspecified, organ involvement unspecified: Secondary | ICD-10-CM | POA: Diagnosis not present

## 2019-01-31 DIAGNOSIS — Z79899 Other long term (current) drug therapy: Secondary | ICD-10-CM | POA: Diagnosis not present

## 2019-01-31 DIAGNOSIS — M1A00X0 Idiopathic chronic gout, unspecified site, without tophus (tophi): Secondary | ICD-10-CM | POA: Diagnosis not present

## 2019-01-31 DIAGNOSIS — M549 Dorsalgia, unspecified: Secondary | ICD-10-CM | POA: Diagnosis not present

## 2019-01-31 DIAGNOSIS — M150 Primary generalized (osteo)arthritis: Secondary | ICD-10-CM | POA: Diagnosis not present

## 2019-02-01 NOTE — Progress Notes (Signed)
[facility] Summary Report / Loop Recorder 

## 2019-02-11 ENCOUNTER — Other Ambulatory Visit: Payer: Self-pay | Admitting: Pulmonary Disease

## 2019-02-19 DIAGNOSIS — M0579 Rheumatoid arthritis with rheumatoid factor of multiple sites without organ or systems involvement: Secondary | ICD-10-CM | POA: Diagnosis not present

## 2019-02-19 DIAGNOSIS — E039 Hypothyroidism, unspecified: Secondary | ICD-10-CM | POA: Diagnosis not present

## 2019-02-19 DIAGNOSIS — N183 Chronic kidney disease, stage 3 (moderate): Secondary | ICD-10-CM | POA: Diagnosis not present

## 2019-02-25 ENCOUNTER — Ambulatory Visit (INDEPENDENT_AMBULATORY_CARE_PROVIDER_SITE_OTHER): Payer: Medicare Other | Admitting: *Deleted

## 2019-02-25 DIAGNOSIS — I480 Paroxysmal atrial fibrillation: Secondary | ICD-10-CM

## 2019-02-25 LAB — CUP PACEART REMOTE DEVICE CHECK
Date Time Interrogation Session: 20200914122903
Implantable Pulse Generator Implant Date: 20200710

## 2019-02-26 DIAGNOSIS — I5032 Chronic diastolic (congestive) heart failure: Secondary | ICD-10-CM | POA: Diagnosis not present

## 2019-02-26 DIAGNOSIS — K219 Gastro-esophageal reflux disease without esophagitis: Secondary | ICD-10-CM | POA: Diagnosis not present

## 2019-02-26 DIAGNOSIS — E039 Hypothyroidism, unspecified: Secondary | ICD-10-CM | POA: Diagnosis not present

## 2019-02-26 DIAGNOSIS — I4891 Unspecified atrial fibrillation: Secondary | ICD-10-CM | POA: Diagnosis not present

## 2019-02-27 ENCOUNTER — Other Ambulatory Visit: Payer: Self-pay | Admitting: Internal Medicine

## 2019-02-27 ENCOUNTER — Telehealth: Payer: Self-pay | Admitting: Internal Medicine

## 2019-02-27 MED ORDER — SOTALOL HCL 80 MG PO TABS: 80.0000 mg | ORAL_TABLET | Freq: Two times a day (BID) | ORAL | 3 refills | Status: DC

## 2019-02-27 NOTE — Telephone Encounter (Signed)
New Message     *STAT* If patient is at the pharmacy, call can be transferred to refill team.   1. Which medications need to be refilled? (please list name of each medication and dose if known) sotalol (BETAPACE) 80 MG tablet   2. Which pharmacy/location (including street and city if local pharmacy) is medication to be sent to? [place], [place]  3. Do they need a 30 day or 90 day supply? 90     .

## 2019-02-27 NOTE — Telephone Encounter (Signed)
Pt's medication was sent to pt's pharmacy as requested. Confirmation received.  °

## 2019-02-27 NOTE — Telephone Encounter (Signed)
[facility] mail order pharmacy needing clarification. [facility] stating that the provider prescribed sotalol (Betapace) 80 mg tablet and metoprolol succinate ER 25 mg tablet, which is a possible duplication of therapy. Should pt be on both medications? Please address  [phone]  Order# [id number]

## 2019-02-28 NOTE — Telephone Encounter (Signed)
Continue current medicines.

## 2019-03-01 MED ORDER — SOTALOL HCL 80 MG PO TABS: 80.0000 mg | ORAL_TABLET | Freq: Two times a day (BID) | ORAL | 3 refills | Status: DC

## 2019-03-01 MED ORDER — METOPROLOL SUCCINATE ER 25 MG PO TB24: ORAL_TABLET | ORAL | 3 refills | Status: DC

## 2019-03-01 NOTE — Telephone Encounter (Signed)
Attempted to call [facility].  Unable to get through.  On hold for 15 minutes.  Resent prescriptions in question and document it was NOT duplicate therapy.

## 2019-03-01 NOTE — Addendum Note (Signed)
Addended by: Willeen Cass A on: 03/01/2019 10:44 AM   Modules accepted: Orders

## 2019-03-08 NOTE — Progress Notes (Signed)
[facility] Summary Report / Loop Recorder 

## 2019-03-10 ENCOUNTER — Other Ambulatory Visit: Payer: Self-pay | Admitting: Internal Medicine

## 2019-03-11 NOTE — Telephone Encounter (Signed)
Pt last saw Dr Rayann Heman 12/21/18, last labs 12/12/18 Creat 1.20, age 73, weight 87.1kg, based on specified criteria pt is on appropriate dosage of Eliquis 5mg  BID.  Will refill rx.

## 2019-03-18 ENCOUNTER — Telehealth: Payer: Self-pay

## 2019-03-18 NOTE — Telephone Encounter (Signed)
Pt states her monitor lights came on then went off. I tried to give her the number to [place]. Pt states she thinks she should do a manual transmission. I told her she can do the manual transmission however I can not see inside the monitor to see why its doing what it is doing. I told her the best thing to do is to call [facility] tech support to see why the lights is coming on.

## 2019-03-22 ENCOUNTER — Ambulatory Visit: Payer: Medicare Other | Admitting: Internal Medicine

## 2019-03-25 ENCOUNTER — Ambulatory Visit: Payer: Medicare Other | Admitting: Internal Medicine

## 2019-03-25 ENCOUNTER — Encounter: Payer: Self-pay | Admitting: Internal Medicine

## 2019-03-25 ENCOUNTER — Other Ambulatory Visit: Payer: Self-pay

## 2019-03-25 VITALS — BP 108/62 | HR 60 | Ht <= 58 in | Wt 188.0 lb

## 2019-03-25 DIAGNOSIS — R002 Palpitations: Secondary | ICD-10-CM

## 2019-03-25 DIAGNOSIS — I10 Essential (primary) hypertension: Secondary | ICD-10-CM | POA: Diagnosis not present

## 2019-03-25 DIAGNOSIS — I480 Paroxysmal atrial fibrillation: Secondary | ICD-10-CM

## 2019-03-25 DIAGNOSIS — G4733 Obstructive sleep apnea (adult) (pediatric): Secondary | ICD-10-CM

## 2019-03-25 NOTE — Addendum Note (Signed)
Addended by: Rose Phi on: 03/25/2019 05:18 PM   Modules accepted: Orders

## 2019-03-25 NOTE — Patient Instructions (Addendum)
Medication Instructions:  Your physician recommends that you continue on your current medications as directed. Please refer to the Current Medication list given to you today.  Labwork: None ordered.  Testing/Procedures: None ordered.  Follow-Up: Your physician wants you to follow-up in: 3 months with Adline Peals at the Afib clinic.   You will receive a reminder letter in the mail two months in advance. If you don't receive a letter, please call our office to schedule the follow-up appointment.  Monthly remote monitoring   Any Other Special Instructions Will Be Listed Below (If Applicable).  If you need a refill on your cardiac medications before your next appointment, please call your pharmacy.

## 2019-03-25 NOTE — Progress Notes (Signed)
PCP: [name], DO   Primary EP: Dr Rayann Heman  [name] is a 73 y.o. female who presents today for routine electrophysiology followup.  Since last being seen in our clinic, the patient reports doing reasonably well.  Not very active.  She has occasional palpitations.  SOB is better.  She is primarily concerned about diarrhea today.  Today, she denies symptoms of chest pain, lower extremity edema, dizziness, presyncope, or syncope.  The patient is otherwise without complaint today.  She is not very active.  She has not been going out much due to COVID 19 concerns.  Past Medical History:  Diagnosis Date  . Anemia   . Bilateral leg edema   . C. difficile enteritis   . CAD (coronary artery disease)    a. LHC 03/2006: EF 65%, mid LAD 40-50%, ostial D1 70-80%, normal circumflex, normal RCA;  b. Lexiscan Myoview 09/2010: No ischemia or scar, EF 75%;  c. Lex MV 5/14:  Normal, no ischemia, EF 71% // Myoview 08/2018: EF 43, normal perfusion; Intermediate Risk due to low EF (Echo 07/2018 with normal EF)   . Cataract    bilateral-had surgery  . Cellulitis of right leg    Hx - resolved  . Chronic diastolic CHF (congestive heart failure) (HCC)    Echo 8/14:  Mild LVH, EF 55-65%, normal wall motion, Tr AI, mild MR, mod TR, PASP 45 // 12/2014 Echo: EF 60-65%, nl wall motion, mildly dil RA/LA, PASP 78mHg.// Echo 07/2018: EF 60-65, mild LVH, mod diastolic dysfunction, normal RVSF, severe LAE, mod MAC, mild MR, mild TR   . Continuous urine leakage   . Dermatomyositis (HD[place]   . Deviated septum   . Essential hypertension   . Family history of adverse reaction to anesthesia    .' MY SON IS A DIFFICULT INTUBATION"  . Gall stones    a. 08/2014 Abd U/S:  Large gallstone measuring 4.2 cm.  .Marland KitchenGERD (gastroesophageal reflux disease)   . Gout   . Hiatal hernia   . History of pancreatitis   . History of PFTs    a. PFTs 10/2012: normal.   . Hyperlipidemia   . Hypothyroidism   . Internal hemorrhoids   .  Morbid obesity (HL[place]   . Multifocal atrial tachycardia ([place] 08/10/2018   Noted during admission in 07/2018  . Osteoarthritis    spine hips knees  . PAF (paroxysmal atrial fibrillation) (HCC)    a. CHA2DS2VASc = 5-->poor OAC candidate 2/2 falls. No problems now per patient 03/2016  . Pneumonia   . Seizures ([place]    as a baby  . Shingles    Hx  . Sleep apnea    Does not use CPAP  . Spinal cord compression ([place] 02/21/2014   Past Surgical History:  Procedure Laterality Date  . CATARACT EXTRACTION, BILATERAL Bilateral   . COLONOSCOPY  03/2011  . Implantable loop recorder placement  12/21/2018   Medtronic Reveal LGrenadamodel [id number](SN [id number]) implanted by Dr ARayann Hemanin office for AF management  . NASAL SEPTUM SURGERY    . TOTAL ABDOMINAL HYSTERECTOMY    . VERTEBROPLASTY  Sept 7 and May 30, 2014   x 2, T11/T12  . WISDOM TOOTH EXTRACTION      ROS- all systems are reviewed and negatives except as per HPI above  Current Outpatient Medications  Medication Sig Dispense Refill  . acetaminophen (TYLENOL) 500 MG tablet Take 500-1,000 mg by mouth every 6 (six) hours as needed for mild  pain or fever.     Marland Kitchen alendronate (FOSAMAX) 70 MG tablet Take 1 tablet by mouth every Tuesday.     Marland Kitchen allopurinol (ZYLOPRIM) 100 MG tablet Take 100 mg by mouth 2 (two) times daily.     Marland Kitchen CALCIUM PO Take 500 mg by mouth daily.    . cetirizine (ZYRTEC) 10 MG tablet Take 10 mg by mouth daily.    . Cholecalciferol (VITAMIN D3) 2000 UNITS TABS Take 2,000 Units by mouth daily.     Marland Kitchen ELIQUIS 5 MG TABS tablet TAKE 1 TABLET BY MOUTH TWICE A DAY 60 tablet 5  . famotidine (PEPCID) 20 MG tablet Take 20 mg by mouth daily.    . folic acid (FOLVITE) 1 MG tablet Take 1 mg by mouth 4 (four) times daily.    . furosemide (LASIX) 40 MG tablet Take 1 tablet (40 mg total) by mouth 2 (two) times daily. 120 tablet 5  . levothyroxine (SYNTHROID) 88 MCG tablet Take 88 mcg by mouth daily.     . methotrexate (RHEUMATREX) 2.5 MG tablet  Take 10 mg by mouth every Wednesday.   3  . metoprolol succinate (TOPROL XL) 25 MG 24 hr tablet Take 1.5 tablets (37.61m) by mouth twice a day 270 tablet 3  . Multiple Vitamin (MULTIVITAMIN) tablet Take 1 tablet by mouth daily.      .Marland KitchenOVER THE COUNTER MEDICATION Place 1 drop into both eyes daily as needed (for dry eyes. Advanced eye relief).    . phenylephrine-shark liver oil-mineral oil-petrolatum (PREPARATION H) 0.25-3-14-71.9 % rectal ointment Place 1 application rectally as needed for hemorrhoids.    . potassium chloride SA (KLOR-CON M20) 20 MEQ tablet Take 1 tablet (20 mEq total) by mouth 3 (three) times daily. 60 tablet 11  . predniSONE (DELTASONE) 5 MG tablet Take 5 mg by mouth daily with breakfast.    . rosuvastatin (CRESTOR) 5 MG tablet Take 5 mg by mouth every other day.     . sotalol (BETAPACE) 80 MG tablet Take 1 tablet (80 mg total) by mouth 2 (two) times daily. 180 tablet 3  . vitamin B-12 (CYANOCOBALAMIN) 1000 MCG tablet Take 1,000 mcg by mouth daily.     No current facility-administered medications for this visit.     Physical Exam: Vitals:   03/25/19 1201  BP: 108/62  Pulse: 60  SpO2: 96%  Weight: 188 lb (85.3 kg)  Height: _0  (1.422 m)    GEN- The patient is chronically ill appearing, overweight, alert and oriented x 3 today.   Head- normocephalic, atraumatic Eyes-  Sclera clear, conjunctiva pink Ears- hearing intact Oropharynx- clear Lungs- normal work of breathing Limited exam due to COVID 19 restrictions  Wt Readings from Last 3 Encounters:  03/25/19 188 lb (85.3 kg)  12/21/18 192 lb (87.1 kg)  12/12/18 192 lb (87.1 kg)    EKG tracing ordered today is personally reviewed and shows sinus rhythm 60 bpm, PR 166 msec, QRS 74 msec, Qtc 448 msec, lateral twi When compared to prior ekg, TWI are new.   Assessment and Plan:  1. Palpitations ILR reviewed Episodes appear to be sinus with PACs rather than AF Device programming optimized to reduce  inappropriate AF detections  2. afib Continue to follow ILR Device optimized as above Continue anticoagulation No real AF episodes  3. Morbid obesity Lifestyle modification is encouraged  3. OSA Previously referred to Dr KRon ParkerDoes not wear CPAP  4. CAD She has new TWI in the lateral leads.  She has known CAD and no new symptoms Continue to follow conservatively  5. HTN Stable No change required today  Return to AF clinic in 3 months to see Wilmon Arms MD, [facility] 03/25/2019 12:17 PM

## 2019-03-28 DIAGNOSIS — N390 Urinary tract infection, site not specified: Secondary | ICD-10-CM | POA: Diagnosis not present

## 2019-03-28 DIAGNOSIS — R300 Dysuria: Secondary | ICD-10-CM | POA: Diagnosis not present

## 2019-04-01 ENCOUNTER — Ambulatory Visit (INDEPENDENT_AMBULATORY_CARE_PROVIDER_SITE_OTHER): Payer: Medicare Other | Admitting: *Deleted

## 2019-04-01 DIAGNOSIS — I480 Paroxysmal atrial fibrillation: Secondary | ICD-10-CM

## 2019-04-01 DIAGNOSIS — I5032 Chronic diastolic (congestive) heart failure: Secondary | ICD-10-CM

## 2019-04-01 LAB — CUP PACEART REMOTE DEVICE CHECK
Date Time Interrogation Session: 20201017144155
Implantable Pulse Generator Implant Date: 20200710

## 2019-04-02 DIAGNOSIS — Z1231 Encounter for screening mammogram for malignant neoplasm of breast: Secondary | ICD-10-CM | POA: Diagnosis not present

## 2019-04-08 ENCOUNTER — Telehealth: Payer: Self-pay

## 2019-04-08 NOTE — Telephone Encounter (Signed)
Pt states her lights keep coming on in the middle of the night. I told the pt to call Medtronic tech support about the lights. I told her we did get data from the monitor last night but I do not know why the lights keep coming on. I told her to call tech support to get additional help.

## 2019-04-22 NOTE — Progress Notes (Signed)
[facility] Summary Report / Loop Recorder 

## 2019-05-02 ENCOUNTER — Ambulatory Visit (INDEPENDENT_AMBULATORY_CARE_PROVIDER_SITE_OTHER): Payer: Medicare Other | Admitting: *Deleted

## 2019-05-02 DIAGNOSIS — M549 Dorsalgia, unspecified: Secondary | ICD-10-CM | POA: Diagnosis not present

## 2019-05-02 DIAGNOSIS — I480 Paroxysmal atrial fibrillation: Secondary | ICD-10-CM

## 2019-05-02 DIAGNOSIS — I5032 Chronic diastolic (congestive) heart failure: Secondary | ICD-10-CM

## 2019-05-02 DIAGNOSIS — M109 Gout, unspecified: Secondary | ICD-10-CM | POA: Diagnosis not present

## 2019-05-02 DIAGNOSIS — M3390 Dermatopolymyositis, unspecified, organ involvement unspecified: Secondary | ICD-10-CM | POA: Diagnosis not present

## 2019-05-02 DIAGNOSIS — M150 Primary generalized (osteo)arthritis: Secondary | ICD-10-CM | POA: Diagnosis not present

## 2019-05-02 DIAGNOSIS — Z79899 Other long term (current) drug therapy: Secondary | ICD-10-CM | POA: Diagnosis not present

## 2019-05-02 DIAGNOSIS — M1A00X0 Idiopathic chronic gout, unspecified site, without tophus (tophi): Secondary | ICD-10-CM | POA: Diagnosis not present

## 2019-05-02 LAB — CUP PACEART REMOTE DEVICE CHECK
Date Time Interrogation Session: 20201119134451
Implantable Pulse Generator Implant Date: 20200710

## 2019-05-14 ENCOUNTER — Telehealth: Payer: Self-pay | Admitting: Emergency Medicine

## 2019-05-14 NOTE — Telephone Encounter (Signed)
Called concerning LINQ report. She was given results and all questions answered.

## 2019-05-31 NOTE — Progress Notes (Signed)
[facility] Summary Report / Loop Recorder 

## 2019-06-04 ENCOUNTER — Ambulatory Visit (INDEPENDENT_AMBULATORY_CARE_PROVIDER_SITE_OTHER): Payer: Medicare Other | Admitting: *Deleted

## 2019-06-04 DIAGNOSIS — I5032 Chronic diastolic (congestive) heart failure: Secondary | ICD-10-CM | POA: Diagnosis not present

## 2019-06-04 LAB — CUP PACEART REMOTE DEVICE CHECK
Date Time Interrogation Session: 20201222134309
Implantable Pulse Generator Implant Date: 20200710

## 2019-06-24 ENCOUNTER — Ambulatory Visit (HOSPITAL_COMMUNITY)
Admission: RE | Admit: 2019-06-24 | Discharge: 2019-06-24 | Disposition: A | Discharge status: Home / Self Care | Payer: Medicare Other | Source: Ambulatory Visit | Attending: Physician Assistant | Admitting: Physician Assistant

## 2019-06-24 ENCOUNTER — Other Ambulatory Visit: Payer: Self-pay

## 2019-06-24 ENCOUNTER — Encounter (HOSPITAL_COMMUNITY): Payer: Self-pay | Admitting: Physician Assistant

## 2019-06-24 VITALS — BP 130/78 | HR 58 | Ht <= 58 in | Wt 202.4 lb

## 2019-06-24 DIAGNOSIS — Z8249 Family history of ischemic heart disease and other diseases of the circulatory system: Secondary | ICD-10-CM | POA: Diagnosis not present

## 2019-06-24 DIAGNOSIS — K219 Gastro-esophageal reflux disease without esophagitis: Secondary | ICD-10-CM | POA: Insufficient documentation

## 2019-06-24 DIAGNOSIS — G4733 Obstructive sleep apnea (adult) (pediatric): Secondary | ICD-10-CM | POA: Diagnosis not present

## 2019-06-24 DIAGNOSIS — E669 Obesity, unspecified: Secondary | ICD-10-CM | POA: Diagnosis not present

## 2019-06-24 DIAGNOSIS — E039 Hypothyroidism, unspecified: Secondary | ICD-10-CM | POA: Diagnosis not present

## 2019-06-24 DIAGNOSIS — Z79890 Hormone replacement therapy (postmenopausal): Secondary | ICD-10-CM | POA: Insufficient documentation

## 2019-06-24 DIAGNOSIS — D6869 Other thrombophilia: Secondary | ICD-10-CM | POA: Diagnosis not present

## 2019-06-24 DIAGNOSIS — E785 Hyperlipidemia, unspecified: Secondary | ICD-10-CM | POA: Insufficient documentation

## 2019-06-24 DIAGNOSIS — Z888 Allergy status to other drugs, medicaments and biological substances status: Secondary | ICD-10-CM | POA: Insufficient documentation

## 2019-06-24 DIAGNOSIS — I5032 Chronic diastolic (congestive) heart failure: Secondary | ICD-10-CM | POA: Insufficient documentation

## 2019-06-24 DIAGNOSIS — I110 Hypertensive heart disease with heart failure: Secondary | ICD-10-CM | POA: Insufficient documentation

## 2019-06-24 DIAGNOSIS — I2510 Atherosclerotic heart disease of native coronary artery without angina pectoris: Secondary | ICD-10-CM | POA: Insufficient documentation

## 2019-06-24 DIAGNOSIS — Z79899 Other long term (current) drug therapy: Secondary | ICD-10-CM | POA: Insufficient documentation

## 2019-06-24 DIAGNOSIS — Z7901 Long term (current) use of anticoagulants: Secondary | ICD-10-CM | POA: Diagnosis not present

## 2019-06-24 DIAGNOSIS — I480 Paroxysmal atrial fibrillation: Secondary | ICD-10-CM

## 2019-06-24 DIAGNOSIS — Z6841 Body Mass Index (BMI) 40.0 and over, adult: Secondary | ICD-10-CM | POA: Diagnosis not present

## 2019-06-24 LAB — BASIC METABOLIC PANEL
Anion gap: 4 — ABNORMAL LOW (ref 5–15)
BUN: 26 mg/dL — ABNORMAL HIGH (ref 8–23)
CO2: 32 mmol/L (ref 22–32)
Calcium: 9.4 mg/dL (ref 8.9–10.3)
Chloride: 108 mmol/L (ref 98–111)
Creatinine, Ser: 1.11 mg/dL — ABNORMAL HIGH (ref 0.44–1.00)
GFR calc Af Amer: 57 mL/min — ABNORMAL LOW (ref >60)
GFR calc non Af Amer: 50 mL/min — ABNORMAL LOW (ref >60)
Glucose, Bld: 96 mg/dL (ref 70–99)
Potassium: 4.9 mmol/L (ref 3.5–5.1)
Sodium: 144 mmol/L (ref 135–145)

## 2019-06-24 LAB — MAGNESIUM: Magnesium: 2.4 mg/dL (ref 1.7–2.4)

## 2019-06-24 NOTE — Progress Notes (Addendum)
Primary Care Physician: [name], DO Primary Cardiologist: none Primary Electrophysiologist: Dr Rayann Heman Referring Physician: Dr Lorne Skeens is a 74 y.o. female with a history of CAD, chronic HFpEF, HTN, MAT, OSA, and paroxysmal atrial fibrillation who presents for follow up in the [facility]. She is on Eliquis for a CHADS2VASC score of 4. Patient reports that she has done reasonably well since her last visit. She does report that she is very sedentary and feels fatigued most of the time. She is intolerant of her CPAP. Her ILR shows afib burden of 0.9% with the longest episode being 14 minutes. She denies any symptoms of fluid overload.  Today, she denies symptoms of palpitations, chest pain, shortness of breath, orthopnea, PND, lower extremity edema, dizziness, presyncope, syncope, snoring, daytime somnolence, bleeding, or neurologic sequela. The patient is tolerating medications without difficulties and is otherwise without complaint today.    Atrial Fibrillation Risk Factors:  she does have symptoms or diagnosis of sleep apnea. she is not compliant with CPAP therapy. she does not have a history of rheumatic fever. she does not have a history of alcohol use.   she has a BMI of Body mass index is 45.38 kg/m.Marland Kitchen Filed Weights   06/24/19 1110  Weight: 91.8 kg    Family History  Problem Relation Age of Onset  . Colon cancer Mother        mets, spot on lungs and spine  . Coronary artery disease Father   . Emphysema Father   . Heart attack Brother        x 2  . Colon cancer Paternal Uncle   . Kidney disease Brother   . Hypertension Brother   . Stroke Neg Hx   . Esophageal cancer Neg Hx      Atrial Fibrillation Management history:  Previous antiarrhythmic drugs: amiodarone (stopped 2/2 tremor), sotalol Previous cardioversions: none Previous ablations: none CHADS2VASC score: 4 Anticoagulation history: Eliquis   Past Medical  History:  Diagnosis Date  . Anemia   . Bilateral leg edema   . C. difficile enteritis   . CAD (coronary artery disease)    a. LHC 03/2006: EF 65%, mid LAD 40-50%, ostial D1 70-80%, normal circumflex, normal RCA;  b. Lexiscan Myoview 09/2010: No ischemia or scar, EF 75%;  c. Lex MV 5/14:  Normal, no ischemia, EF 71% // Myoview 08/2018: EF 43, normal perfusion; Intermediate Risk due to low EF (Echo 07/2018 with normal EF)   . Cataract    bilateral-had surgery  . Cellulitis of right leg    Hx - resolved  . Chronic diastolic CHF (congestive heart failure) (HCC)    Echo 8/14:  Mild LVH, EF 55-65%, normal wall motion, Tr AI, mild MR, mod TR, PASP 45 // 12/2014 Echo: EF 60-65%, nl wall motion, mildly dil RA/LA, PASP 72mHg.// Echo 07/2018: EF 60-65, mild LVH, mod diastolic dysfunction, normal RVSF, severe LAE, mod MAC, mild MR, mild TR   . Continuous urine leakage   . Dermatomyositis (HErda   . Deviated septum   . Essential hypertension   . Family history of adverse reaction to anesthesia    .' MY SON IS A DIFFICULT INTUBATION"  . Gall stones    a. 08/2014 Abd U/S:  Large gallstone measuring 4.2 cm.  .Marland KitchenGERD (gastroesophageal reflux disease)   . Gout   . Hiatal hernia   . History of pancreatitis   . History of PFTs    a.  PFTs 10/2012: normal.   . Hyperlipidemia   . Hypothyroidism   . Internal hemorrhoids   . Morbid obesity ([place])   . Multifocal atrial tachycardia ([place]) 08/10/2018   Noted during admission in 07/2018  . Osteoarthritis    spine hips knees  . PAF (paroxysmal atrial fibrillation) (HCC)    a. CHA2DS2VASc = 5-->poor OAC candidate 2/2 falls. No problems now per patient 03/2016  . Pneumonia   . Seizures ([place])    as a baby  . Shingles    Hx  . Sleep apnea    Does not use CPAP  . Spinal cord compression ([place]) 02/21/2014   Past Surgical History:  Procedure Laterality Date  . CATARACT EXTRACTION, BILATERAL Bilateral   . COLONOSCOPY  03/2011  . Implantable loop recorder placement   12/21/2018   Medtronic Reveal [place] model [id number] (SN [id number]) implanted by Dr Rayann Heman in office for AF management  . NASAL SEPTUM SURGERY    . TOTAL ABDOMINAL HYSTERECTOMY    . VERTEBROPLASTY  Sept 7 and May 30, 2014   x 2, T11/T12  . WISDOM TOOTH EXTRACTION      Current Outpatient Medications  Medication Sig Dispense Refill  . acetaminophen (TYLENOL) 500 MG tablet Take 500-1,000 mg by mouth every 6 (six) hours as needed for mild pain or fever.     Marland Kitchen alendronate (FOSAMAX) 70 MG tablet Take 1 tablet by mouth every Tuesday.     Marland Kitchen allopurinol (ZYLOPRIM) 100 MG tablet Take 100 mg by mouth 2 (two) times daily.     Marland Kitchen CALCIUM PO Take 500 mg by mouth daily.    . cetirizine (ZYRTEC) 10 MG tablet Take 10 mg by mouth daily.    . Cholecalciferol (VITAMIN D3) 2000 UNITS TABS Take 2,000 Units by mouth daily.     Marland Kitchen ELIQUIS 5 MG TABS tablet TAKE 1 TABLET BY MOUTH TWICE A DAY 60 tablet 5  . famotidine (PEPCID) 20 MG tablet Take 20 mg by mouth daily.    . folic acid (FOLVITE) 1 MG tablet Take 1 mg by mouth 4 (four) times daily.    . furosemide (LASIX) 40 MG tablet Take 1 tablet (40 mg total) by mouth 2 (two) times daily. 120 tablet 5  . levothyroxine (SYNTHROID) 88 MCG tablet Take 88 mcg by mouth daily.     . methotrexate (RHEUMATREX) 2.5 MG tablet Take 12.5 mg by mouth every Wednesday.   3  . metoprolol succinate (TOPROL XL) 25 MG 24 hr tablet Take 1.5 tablets (37.28m) by mouth twice a day 270 tablet 3  . Multiple Vitamin (MULTIVITAMIN) tablet Take 1 tablet by mouth daily.      .Marland KitchenOVER THE COUNTER MEDICATION Place 1 drop into both eyes daily as needed (for dry eyes. Advanced eye relief).    . phenylephrine-shark liver oil-mineral oil-petrolatum (PREPARATION H) 0.25-3-14-71.9 % rectal ointment Place 1 application rectally as needed for hemorrhoids.    . potassium chloride SA (KLOR-CON M20) 20 MEQ tablet Take 1 tablet (20 mEq total) by mouth 3 (three) times daily. 60 tablet 11  . predniSONE (DELTASONE) 5 MG  tablet Take 5 mg by mouth daily with breakfast.    . rosuvastatin (CRESTOR) 5 MG tablet Take 5 mg by mouth every other day.     . sotalol (BETAPACE) 80 MG tablet Take 1 tablet (80 mg total) by mouth 2 (two) times daily. 180 tablet 3  . vitamin B-12 (CYANOCOBALAMIN) 1000 MCG tablet Take 1,000 mcg by mouth daily.  No current facility-administered medications for this encounter.    Allergies  Allergen Reactions  . Butoconazole Itching, Rash and Other (See Comments)    'Redness and swelling feverish ' 'worse symptoms '  . Keflex [Cephalexin] Diarrhea  . Lipitor [Atorvastatin Calcium] Other (See Comments)    'Muscle weakness'      Social History   Socioeconomic History  . Marital status: Married    Spouse name: Not on file  . Number of children: 2  . Years of education: Not on file  . Highest education level: Not on file  Occupational History    Employer: UNEMPLOYED  Tobacco Use  . Smoking status: Never Smoker  . Smokeless tobacco: Never Used  Substance and Sexual Activity  . Alcohol use: No  . Drug use: No  . Sexual activity: Not Currently    Birth control/protection: Post-menopausal  Other Topics Concern  . Not on file  Social History Narrative  . Not on file   Social Determinants of Health   Financial Resource Strain:   . Difficulty of Paying Living Expenses: Not on file  Food Insecurity:   . Worried About Charity fundraiser in the Last Year: Not on file  . Ran Out of Food in the Last Year: Not on file  Transportation Needs:   . Lack of Transportation (Medical): Not on file  . Lack of Transportation (Non-Medical): Not on file  Physical Activity:   . Days of Exercise per Week: Not on file  . Minutes of Exercise per Session: Not on file  Stress:   . Feeling of Stress : Not on file  Social Connections:   . Frequency of Communication with Friends and Family: Not on file  . Frequency of Social Gatherings with Friends and Family: Not on file  . Attends Religious  Services: Not on file  . Active Member of Clubs or Organizations: Not on file  . Attends Archivist Meetings: Not on file  . Marital Status: Not on file  Intimate Partner Violence:   . Fear of Current or Ex-Partner: Not on file  . Emotionally Abused: Not on file  . Physically Abused: Not on file  . Sexually Abused: Not on file     ROS- All systems are reviewed and negative except as per the HPI above.  Physical Exam: Vitals:   06/24/19 1110  BP: 130/78  Pulse: (!) 58  Weight: 91.8 kg  Height: _0  (1.422 m)    GEN- The patient is well appearing obese female, alert and oriented x 3 today.   HEENT-head normocephalic, atraumatic, sclera clear, conjunctiva pink, hearing intact, trachea midline. Lungs- Clear to ausculation bilaterally, normal work of breathing Heart- Regular rate and rhythm, occasional ectopic beat, no murmurs, rubs or gallops  GI- soft, NT, ND, + BS Extremities- no clubbing, cyanosis, or edema MS- no significant deformity or atrophy Skin- no rash or lesion Psych- euthymic mood, full affect Neuro- strength and sensation are intact   Wt Readings from Last 3 Encounters:  06/24/19 91.8 kg  03/25/19 85.3 kg  12/21/18 87.1 kg    EKG today demonstrates SB HR 56, PAC vs sinus arrhythmia, NST, PR 176, QRS 68, QTc 445   Echo 07/26/18 demonstrated  1. The left ventricle has normal systolic function with an ejection fraction of 60-65%. The cavity size was normal. There is mildly increased left ventricular wall thickness. Left ventricular diastolic Doppler parameters are consistent with  pseudonormalization.  2. The right ventricle has normal systolic  function. The cavity was normal. There is no increase in right ventricular wall thickness.  3. Left atrial size was severely dilated.  4. The mitral valve is normal in structure. There is mild thickening. There is moderate mitral annular calcification present.  5. The tricuspid valve is normal in structure.   6. The aortic valve is tricuspid There is mild thickening of the aortic valve.  7. The pulmonic valve was normal in structure.  8. Normal LV systolic function; mild LVH; moderate diastolic dysfunction; moderate LAE; mild MR and TR.  9. Right atrial pressure is estimated at 3 mmHg.  Epic records are reviewed at length today  Assessment and Plan:  1. Paroxysmal atrial fibrillation ILR shows low afib burden 0.9% with longest episode lasting 14 minutes.  Continue sotalol 80 mg BID, QT stable.  Check bmet/mag today. Continue Eliquis 5 mg BID  This patients CHA2DS2-VASc Score and unadjusted Ischemic Stroke Rate (% per year) is equal to 4.8 % stroke rate/year from a score of 4  Above score calculated as 1 point each if present [CHF, HTN, DM, Vascular=MI/PAD/Aortic Plaque, Age if 65-74, or Female] Above score calculated as 2 points each if present [Age > 75, or Stroke/TIA/TE]   2. Obesity Body mass index is 45.38 kg/m. Lifestyle modification was discussed and encouraged including regular physical activity and weight reduction. Patient has a stationary bike and she plans to start using it. She admits she knows her diet is not healthy. Her weight gain does not appear fluid related as she denies orthopnea, PND, or increased lower extremity edema.  3. Obstructive sleep apnea The importance of adequate treatment of sleep apnea was discussed today in order to improve our ability to maintain sinus rhythm long term. She is intolerant of CPAP. Plan referral to Dr Ron Parker to consider oral device. I suspect untreated OSA is contributing significantly to her fatigue.   4. CAD No anginal symptoms. Followed by Dr Rayann Heman.  5. HTN Stable, no changes today.   Follow up in the AF clinic in 3 months.   [facility] [address] [place], [place] [address] [phone] 06/24/2019 11:50 AM

## 2019-07-08 ENCOUNTER — Telehealth: Payer: Self-pay

## 2019-07-08 ENCOUNTER — Ambulatory Visit (INDEPENDENT_AMBULATORY_CARE_PROVIDER_SITE_OTHER): Payer: Medicare Other | Admitting: *Deleted

## 2019-07-08 DIAGNOSIS — R0789 Other chest pain: Secondary | ICD-10-CM | POA: Diagnosis not present

## 2019-07-08 DIAGNOSIS — I5032 Chronic diastolic (congestive) heart failure: Secondary | ICD-10-CM

## 2019-07-08 DIAGNOSIS — S299XXA Unspecified injury of thorax, initial encounter: Secondary | ICD-10-CM | POA: Diagnosis not present

## 2019-07-08 DIAGNOSIS — W19XXXA Unspecified fall, initial encounter: Secondary | ICD-10-CM | POA: Diagnosis not present

## 2019-07-08 DIAGNOSIS — R0781 Pleurodynia: Secondary | ICD-10-CM | POA: Diagnosis not present

## 2019-07-08 LAB — CUP PACEART REMOTE DEVICE CHECK
Date Time Interrogation Session: 20210124235903
Implantable Pulse Generator Implant Date: 20200710

## 2019-07-08 NOTE — Telephone Encounter (Signed)
Pt states she been having a lot of pain down her left side. She also having some chest pain. The pt states she also been having some heartburn and indigestion.  Pt denies dizziness or lightheadedness. She states her heart rate is fine and her blood pressure been okay. Pt states she fell on 07/01/2019. She states she fell on her right side. I had the pt send a manual transmission for the nurse to review. I let her talk with the device nurse Raquel Sarna, Rn.

## 2019-07-08 NOTE — Telephone Encounter (Signed)
Spoke with patient. She reports pain extending from under under her right breast to center chest, feels sharp and sore. Worse with palpation or when laying down. Pain began after a fall on 07/01/19, tripped over a blanket while holding her great grandson. Has also had pain at the same site in the past due to nerve damage from shingles. Also experiencing intermittent heartburn and reflux, using Pepcid and Maalox, which have helped. Denies SOB, dizziness, syncope, palpitations.  Advised LINQ transmission shows most recent AF episode was on 07/04/19, duration 53min. Explained symptoms seem related to fall and/or reflux, encouraged pt to contact PCP. Pt in agreement, reports she does not think "it's [my] heart, but I wanted you to notate it because of my transmission today" (automatic LINQ monthly summary report). ED precautions reviewed for new or worsening symptoms. Pt agrees to contact PCP today. No additional concerns at this time.  Routed to Dr. Rayann Heman as Juluis Rainier.

## 2019-07-12 NOTE — Telephone Encounter (Signed)
I agree with follow-up with PCP.

## 2019-07-30 DIAGNOSIS — Z79899 Other long term (current) drug therapy: Secondary | ICD-10-CM | POA: Diagnosis not present

## 2019-07-30 DIAGNOSIS — H35383 Toxic maculopathy, bilateral: Secondary | ICD-10-CM | POA: Diagnosis not present

## 2019-07-30 DIAGNOSIS — M3310 Other dermatopolymyositis, organ involvement unspecified: Secondary | ICD-10-CM | POA: Diagnosis not present

## 2019-07-30 DIAGNOSIS — H35372 Puckering of macula, left eye: Secondary | ICD-10-CM | POA: Diagnosis not present

## 2019-08-04 ENCOUNTER — Ambulatory Visit: Payer: Medicare Other | Attending: Internal Medicine

## 2019-08-04 DIAGNOSIS — Z23 Encounter for immunization: Secondary | ICD-10-CM | POA: Insufficient documentation

## 2019-08-04 NOTE — Progress Notes (Signed)
   Covid-19 Vaccination Clinic  Name:  TRESIA FRONEK    MRN: [id number] DOB: [birth year]  08/04/2019  Ms. Kreuser was observed post Covid-19 immunization for 30 minutes based on pre-vaccination screening without incidence. She was provided with Vaccine Information Sheet and instruction to access the V-Safe system.   Ms. [place] was instructed to call 911 with any severe reactions post vaccine: Marland Kitchen Difficulty breathing  . Swelling of your face and throat  . A fast heartbeat  . A bad rash all over your body  . Dizziness and weakness    Immunizations Administered    Name Date Dose VIS Date Route   Pfizer COVID-19 Vaccine 08/04/2019  9:48 AM 0.3 mL 05/24/2019 Intramuscular   Manufacturer: [place]   Lot: [id number]   [place]: [id number]

## 2019-08-05 DIAGNOSIS — M150 Primary generalized (osteo)arthritis: Secondary | ICD-10-CM | POA: Diagnosis not present

## 2019-08-05 DIAGNOSIS — Z79899 Other long term (current) drug therapy: Secondary | ICD-10-CM | POA: Diagnosis not present

## 2019-08-05 DIAGNOSIS — M549 Dorsalgia, unspecified: Secondary | ICD-10-CM | POA: Diagnosis not present

## 2019-08-05 DIAGNOSIS — M810 Age-related osteoporosis without current pathological fracture: Secondary | ICD-10-CM | POA: Diagnosis not present

## 2019-08-05 DIAGNOSIS — M3390 Dermatopolymyositis, unspecified, organ involvement unspecified: Secondary | ICD-10-CM | POA: Diagnosis not present

## 2019-08-12 ENCOUNTER — Ambulatory Visit (INDEPENDENT_AMBULATORY_CARE_PROVIDER_SITE_OTHER): Payer: Medicare Other | Admitting: *Deleted

## 2019-08-12 DIAGNOSIS — I5032 Chronic diastolic (congestive) heart failure: Secondary | ICD-10-CM | POA: Diagnosis not present

## 2019-08-12 LAB — CUP PACEART REMOTE DEVICE CHECK
Date Time Interrogation Session: 20210301003912
Implantable Pulse Generator Implant Date: 20200710

## 2019-08-13 NOTE — Progress Notes (Signed)
ILR Remote 

## 2019-08-21 DIAGNOSIS — N390 Urinary tract infection, site not specified: Secondary | ICD-10-CM | POA: Diagnosis not present

## 2019-08-21 DIAGNOSIS — R300 Dysuria: Secondary | ICD-10-CM | POA: Diagnosis not present

## 2019-08-28 ENCOUNTER — Ambulatory Visit: Payer: Medicare Other | Attending: Internal Medicine

## 2019-08-28 DIAGNOSIS — Z23 Encounter for immunization: Secondary | ICD-10-CM

## 2019-08-28 NOTE — Progress Notes (Signed)
   Covid-19 Vaccination Clinic  Name:  [name]    MRN: [id number] DOB: 09/08/1945  08/28/2019  [name] was observed post Covid-19 immunization for 15 minutes without incident. She was provided with Vaccine Information Sheet and instruction to access the V-Safe system.   [name] was instructed to call 911 with any severe reactions post vaccine: Marland Kitchen Difficulty breathing  . Swelling of face and throat  . A fast heartbeat  . A bad rash all over body  . Dizziness and weakness   Immunizations Administered    Name Date Dose VIS Date Route   Pfizer COVID-19 Vaccine 08/28/2019  8:08 AM 0.3 mL 05/24/2019 Intramuscular   Manufacturer: [place]   Lot: [id number]   [place]: [id number]

## 2019-08-29 ENCOUNTER — Telehealth (HOSPITAL_COMMUNITY): Payer: Self-pay | Admitting: *Deleted

## 2019-08-29 NOTE — Telephone Encounter (Signed)
Patient called in with multiple complaints after covid shot yesterday. Having some issues with elevated BP, urinary symptoms and feeling irregular pulse - she will send linq transmission to see if she is having afib. For bp & urinary issues she will contact her PCP. Will await transmission if afib we will let her know.

## 2019-08-29 NOTE — Telephone Encounter (Signed)
Transmission received pt states BP has normalized after taking her morning medication. Reassured she was not currently in AF per Adline Peals appears SR with PACs. She will follow her BP over the next few days and let us know if BP trends up again. Pt states getting the covid vaccine yesterday was very anxiety provoking so it may have played into her symptoms this morning.

## 2019-09-09 ENCOUNTER — Other Ambulatory Visit: Payer: Self-pay | Admitting: Internal Medicine

## 2019-09-09 ENCOUNTER — Telehealth: Payer: Self-pay | Admitting: Internal Medicine

## 2019-09-09 NOTE — Telephone Encounter (Signed)
Pt c/o BP issue: STAT if pt c/o blurred vision, one-sided weakness or slurred speech  1. What are your last 5 BP readings? 166/106 (today), 162/104, 153/81, 156/89, 169/95, 177/103, 172/101, 188/104 and 199/106  2. Are you having any other symptoms (ex. Dizziness, headache, blurred vision, passed out)? Headache when her BP goes up. Before she takes medication she does have blurred vision - does not have it right now.   3. What is your BP issue? BP is elevated. States when she takes her medication it takes an hour for it to go back to normal, then it rises once it starts wearing off. She contacted the afib clinic who told her to contact her PCP, PCP told her to contact her cardiologist. Patient began crying on the phone because she did not know who to call.

## 2019-09-09 NOTE — Telephone Encounter (Signed)
Age 74, weight 92kg, SCr 1.11, last OV Jan 2021, afib indication

## 2019-09-09 NOTE — Telephone Encounter (Signed)
Appt made with AT this week to discuss blood pressure treatment.

## 2019-09-12 ENCOUNTER — Telehealth: Payer: Self-pay | Admitting: Student

## 2019-09-12 ENCOUNTER — Ambulatory Visit: Payer: Medicare Other | Admitting: Student

## 2019-09-12 ENCOUNTER — Encounter: Payer: Self-pay | Admitting: Student

## 2019-09-12 ENCOUNTER — Other Ambulatory Visit: Payer: Self-pay

## 2019-09-12 VITALS — BP 143/80 | HR 63 | Ht <= 58 in | Wt 205.4 lb

## 2019-09-12 DIAGNOSIS — I10 Essential (primary) hypertension: Secondary | ICD-10-CM

## 2019-09-12 DIAGNOSIS — Z79899 Other long term (current) drug therapy: Secondary | ICD-10-CM

## 2019-09-12 DIAGNOSIS — I480 Paroxysmal atrial fibrillation: Secondary | ICD-10-CM | POA: Diagnosis not present

## 2019-09-12 DIAGNOSIS — I5032 Chronic diastolic (congestive) heart failure: Secondary | ICD-10-CM

## 2019-09-12 DIAGNOSIS — I2510 Atherosclerotic heart disease of native coronary artery without angina pectoris: Secondary | ICD-10-CM

## 2019-09-12 LAB — BASIC METABOLIC PANEL
BUN/Creatinine Ratio: 23 (ref 12–28)
BUN: 25 mg/dL (ref 8–27)
CO2: 25 mmol/L (ref 20–29)
Calcium: 9.6 mg/dL (ref 8.7–10.3)
Chloride: 104 mmol/L (ref 96–106)
Creatinine, Ser: 1.07 mg/dL — ABNORMAL HIGH (ref 0.57–1.00)
GFR calc Af Amer: 60 mL/min/{1.73_m2} (ref >59)
GFR calc non Af Amer: 52 mL/min/{1.73_m2} — ABNORMAL LOW (ref >59)
Glucose: 92 mg/dL (ref 65–99)
Potassium: 4.7 mmol/L (ref 3.5–5.2)
Sodium: 144 mmol/L (ref 134–144)

## 2019-09-12 LAB — CUP PACEART INCLINIC DEVICE CHECK
Date Time Interrogation Session: 20210401124549
Implantable Pulse Generator Implant Date: 20200710

## 2019-09-12 MED ORDER — LOSARTAN POTASSIUM 25 MG PO TABS: 25.0000 mg | ORAL_TABLET | Freq: Every day | ORAL | 3 refills | Status: DC

## 2019-09-12 NOTE — Telephone Encounter (Signed)
I spoke to the patient and reviewed medication instructions.  [name] added Losartan in addition to her Metoprolol.  She verbalized understanding.

## 2019-09-12 NOTE — Progress Notes (Signed)
Electrophysiology Office Note Date: 09/12/2019  ID:  [name], DOB 1946/02/04, MRN [id number]  PCP: [name], DO Primary Cardiologist: [name], MD Electrophysiologist: [name], MD   CC: Pacemaker follow-up  [name] is a 74 y.o. female seen today for Dr. Rayann Heman . she presents today for add on due to BP issues. BP running as high as 190s at home at times with blurry vision. She has been off caffeine for [name], and does not plan on restarting it. She watches sodium intake. Had jarred spaghetti sauce for dinner last night, but does not add any tablesalt to their meals. Otherwise she is doing well, has just been concerned about her BP.    Device History: Medtronic ILR PPM implanted 12/21/2018 for AF monitoring  Past Medical History:  Diagnosis Date  . Anemia   . Bilateral leg edema   . C. difficile enteritis   . CAD (coronary artery disease)    a. LHC 03/2006: EF 65%, mid LAD 40-50%, ostial D1 70-80%, normal circumflex, normal RCA;  b. Lexiscan Myoview 09/2010: No ischemia or scar, EF 75%;  c. Lex MV 5/14:  Normal, no ischemia, EF 71% // Myoview 08/2018: EF 43, normal perfusion; Intermediate Risk due to low EF (Echo 07/2018 with normal EF)   . Cataract    bilateral-had surgery  . Cellulitis of right leg    Hx - resolved  . Chronic diastolic CHF (congestive heart failure) (HCC)    Echo 8/14:  Mild LVH, EF 55-65%, normal wall motion, Tr AI, mild MR, mod TR, PASP 45 // 12/2014 Echo: EF 60-65%, nl wall motion, mildly dil RA/LA, PASP 78mHg.// Echo 07/2018: EF 60-65, mild LVH, mod diastolic dysfunction, normal RVSF, severe LAE, mod MAC, mild MR, mild TR   . Continuous urine leakage   . D[place]atomyositis ([place]   . Deviated septum   . Essential hypertension   . Family history of adverse reaction to anesthesia    .' MY SON IS A DIFFICULT INTUBATION"  . Gall stones    a. 08/2014 Abd U/S:  Large gallstone measuring 4.2 cm.  .Marland KitchenGERD (gastroesophageal reflux disease)   . Gout   .  Hiatal hernia   . History of pancreatitis   . History of PFTs    a. PFTs 10/2012: normal.   . Hyperlipidemia   . Hypothyroidism   . Internal hemorrhoids   . Morbid obesity ([place]   . Multifocal atrial tachycardia ([place] 08/10/2018   Noted during admission in 07/2018  . Osteoarthritis    spine hips knees  . PAF (paroxysmal atrial fibrillation) (HCC)    a. CHA2DS2VASc = 5-->poor OAC candidate 2/2 falls. No problems now per patient 03/2016  . Pneumonia   . Seizures ([place]    as a baby  . Shingles    Hx  . Sleep apnea    Does not use CPAP  . Spinal cord compression ([place] 02/21/2014   Past Surgical History:  Procedure Laterality Date  . CATARACT EXTRACTION, BILATERAL Bilateral   . COLONOSCOPY  03/2011  . Implantable loop recorder placement  12/21/2018   Medtronic Reveal LChestermodel [id number](SN [id number]) implanted by Dr ARayann Hemanin office for AF management  . NASAL SEPTUM SURGERY    . TOTAL ABDOMINAL HYSTERECTOMY    . VERTEBROPLASTY  Sept 7 and May 30, 2014   x 2, T11/T12  . WISDOM TOOTH EXTRACTION      Current Outpatient Medications  Medication Sig Dispense Refill  . acetaminophen (  TYLENOL) 500 MG tablet Take 500-1,000 mg by mouth every 6 (six) hours as needed for mild pain or fever.     Marland Kitchen alendronate (FOSAMAX) 70 MG tablet Take 1 tablet by mouth every Tuesday.     Marland Kitchen allopurinol (ZYLOPRIM) 100 MG tablet Take 100 mg by mouth 2 (two) times daily.     Marland Kitchen CALCIUM PO Take 500 mg by mouth daily.    . cetirizine (ZYRTEC) 10 MG tablet Take 10 mg by mouth daily.    . Cholecalciferol (VITAMIN D3) 2000 UNITS TABS Take 2,000 Units by mouth daily.     Marland Kitchen ELIQUIS 5 MG TABS tablet TAKE 1 TABLET BY MOUTH TWICE A DAY 60 tablet 5  . famotidine (PEPCID) 20 MG tablet Take 20 mg by mouth daily.    . folic acid (FOLVITE) 1 MG tablet Take 1 mg by mouth 4 (four) times daily.    . furosemide (LASIX) 40 MG tablet Take 1 tablet (40 mg total) by mouth 2 (two) times daily. 120 tablet 5  . levothyroxine (SYNTHROID)  88 MCG tablet Take 88 mcg by mouth daily.     . methotrexate (RHEUMATREX) 2.5 MG tablet Take 12.5 mg by mouth every Wednesday.   3  . metoprolol succinate (TOPROL XL) 25 MG 24 hr tablet Take 1.5 tablets (37.3m) by mouth twice a day 270 tablet 3  . Multiple Vitamin (MULTIVITAMIN) tablet Take 1 tablet by mouth daily.      .Marland KitchenOVER THE COUNTER MEDICATION Place 1 drop into both eyes daily as needed (for dry eyes. Advanced eye relief).    . phenylephrine-shark liver oil-mineral oil-petrolatum (PREPARATION H) 0.25-3-14-71.9 % rectal ointment Place 1 application rectally as needed for hemorrhoids.    . potassium chloride SA (KLOR-CON M20) 20 MEQ tablet Take 1 tablet (20 mEq total) by mouth 3 (three) times daily. 60 tablet 11  . predniSONE (DELTASONE) 5 MG tablet Take 5 mg by mouth daily with breakfast.    . rosuvastatin (CRESTOR) 5 MG tablet Take 5 mg by mouth every other day.     . sotalol (BETAPACE) 80 MG tablet Take 1 tablet (80 mg total) by mouth 2 (two) times daily. 180 tablet 3  . vitamin B-12 (CYANOCOBALAMIN) 1000 MCG tablet Take 1,000 mcg by mouth daily.    .Marland Kitchenlosartan (COZAAR) 25 MG tablet Take 1 tablet (25 mg total) by mouth daily. 90 tablet 3   No current facility-administered medications for this visit.    Allergies:   Butoconazole, Keflex [cephalexin], and Lipitor [atorvastatin calcium]   Social History: Social History   Socioeconomic History  . Marital status: Married    Spouse name: Not on file  . Number of children: 2  . Years of education: Not on file  . Highest education level: Not on file  Occupational History    Employer: UNEMPLOYED  Tobacco Use  . Smoking status: Never Smoker  . Smokeless tobacco: Never Used  Substance and Sexual Activity  . Alcohol use: No  . Drug use: No  . Sexual activity: Not Currently    Birth control/protection: Post-menopausal  Other Topics Concern  . Not on file  Social History Narrative  . Not on file   Social Determinants of Health    Financial Resource Strain:   . Difficulty of Paying Living Expenses:   Food Insecurity:   . Worried About RCharity fundraiserin the Last Year:   . RArboriculturistin the Last Year:   Transportation Needs:   .  Lack of Transportation (Medical):   Marland Kitchen Lack of Transportation (Non-Medical):   Physical Activity:   . Days of Exercise per Week:   . Minutes of Exercise per Session:   Stress:   . Feeling of Stress :   Social Connections:   . Frequency of Communication with Friends and Family:   . Frequency of Social Gatherings with Friends and Family:   . Attends Religious Services:   . Active Member of Clubs or Organizations:   . Attends Archivist Meetings:   Marland Kitchen Marital Status:   Intimate Partner Violence:   . Fear of Current or Ex-Partner:   . Emotionally Abused:   Marland Kitchen Physically Abused:   . Sexually Abused:     Family History: Family History  Problem Relation Age of Onset  . Colon cancer Mother        mets, spot on lungs and spine  . Coronary artery disease Father   . Emphysema Father   . Heart attack Brother        x 2  . Colon cancer Paternal Uncle   . Kidney disease Brother   . Hypertension Brother   . Stroke Neg Hx   . Esophageal cancer Neg Hx      Review of Systems: All other systems reviewed and are otherwise negative except as noted above.  Physical Exam: Vitals:   09/12/19 1044  BP: (!) 143/80  Pulse: 63  SpO2: 99%  Weight: 205 lb 6.4 oz (93.2 kg)  Height: _0  (1.422 m)     GEN- The patient is well appearing, alert and oriented x 3 today.   HEENT: normocephalic, atraumatic; sclera clear, conjunctiva pink; hearing intact; oropharynx clear; neck supple  Lungs- Clear to ausculation bilaterally, normal work of breathing.  No wheezes, rales, rhonchi Heart- Regular rate and rhythm, no murmurs, rubs or gallops  GI- soft, non-tender, non-distended, bowel sounds present  Extremities- no clubbing, cyanosis, or edema  MS- no significant deformity or  atrophy Skin- warm and dry, no rash or lesion Psych- euthymic mood, full affect Neuro- strength and sensation are intact  ILR Interrogation- reviewed in detail today,  See PACEART report  EKG:  EKG is not ordered today.  Recent Labs: 12/12/2018: Hemoglobin 14.5; Platelets 166 06/24/2019: BUN 26; Creatinine, Ser 1.11; Magnesium 2.4; Potassium 4.9; Sodium 144   Wt Readings from Last 3 Encounters:  09/12/19 205 lb 6.4 oz (93.2 kg)  06/24/19 202 lb 6.4 oz (91.8 kg)  03/25/19 188 lb (85.3 kg)     Other studies Reviewed: Additional studies/ records that were reviewed today include: Echo 07/2018 shows LVEF 60-65%, Previous EP office notes, Previous remote checks, Most recent labwork.   Assessment and Plan:  1. Paroxysmal AF s/p ILR 12/2018 Normal device function ILR reviewed shows 0.4% AF. Known.   2. HTN Labile recently Will add losartan 25 mg daily. BMET today on lasix and K. Should recheck at AF clinic visit in 2 weeks.   3. Morbid obesity Body mass index is 46.05 kg/m.  4. OSA Does not wear CPAP  5. CAD Known CAD. No knew symptoms Continue to follow conservatively    Current medicines are reviewed at length with the patient today.   The patient does not have concerns regarding her medicines.  The following changes were made today:  Add losartan as above.   Labs/ tests ordered today include:  Orders Placed This Encounter  Procedures  . Basic Metabolic Panel (BMET)  . [place]  Disposition:   Follow up with Dr. Rayann Heman as scheduled. Keep AF clinic appt 2 weeks. BMET then to follow now on losartan.   Jacalyn Lefevre, PA-C  09/12/2019 12:53 PM  [place] [address] [phone] (office) [phone] (fax)

## 2019-09-12 NOTE — Telephone Encounter (Signed)
New message     Patient was seen today.  She was started on losartan.  Patient is calling to see if she should stop taking the metoprolol or continue it.  Please advise

## 2019-09-12 NOTE — Patient Instructions (Addendum)
Medication Instructions:  START LOSARTAN 25 mg DAILY *If you need a refill on your cardiac medications before your next appointment, please call your pharmacy*   Lab Work:  TODAY BMET If you have labs (blood work) drawn today and your tests are completely normal, you will receive your results only by: Marland Kitchen MyChart Message (if you have MyChart) OR . A paper copy in the mail If you have any lab test that is abnormal or we need to change your treatment, we will call you to review the results.   Testing/Procedures: none   Follow-Up: At [facility], you and your health needs are our priority.  As part of our continuing mission to provide you with exceptional heart care, we have created designated Provider Care Teams.  These Care Teams include your primary Cardiologist (physician) and Advanced Practice Providers (APPs -  Physician Assistants and Nurse Practitioners) who all work together to provide you with the care you need, when you need it.    Other Instructions  Losartan Tablets What is this medicine? LOSARTAN (loe SAR tan) is an angiotensin II receptor blocker, also known as an ARB. It treats high blood pressure. It can slow kidney damage in some patients. It may also be used to lower the risk of stroke. This medicine may be used for other purposes; ask your health care provider or pharmacist if you have questions. COMMON BRAND NAME(S): Cozaar What should I tell my health care provider before I take this medicine? They need to know if you have any of these conditions:  heart failure  kidney or liver disease  an unusual or allergic reaction to losartan, other medicines, foods, dyes, or preservatives  pregnant or trying to get pregnant  breast-feeding How should I use this medicine? Take this drug by mouth. Take it as directed on the prescription label at the same time every day. You can take it with or without food. If it upsets your stomach, take it with food. Keep taking it  unless your health care provider tells you to stop. Talk to your health care provider about the use of this drug in children. While it may be prescribed for children as young as 6 for selected conditions, precautions do apply. Overdosage: If you think you have taken too much of this medicine contact a poison control center or emergency room at once. NOTE: This medicine is only for you. Do not share this medicine with others. What if I miss a dose? If you miss a dose, take it as soon as you can. If it is almost time for your next dose, take only that dose. Do not take double or extra doses. What may interact with this medicine?  blood pressure medicines  diuretics, especially triamterene, spironolactone, or amiloride  fluconazole  NSAIDs, medicines for pain and inflammation, like ibuprofen or naproxen  potassium salts or potassium supplements  rifampin This list may not describe all possible interactions. Give your health care provider a list of all the medicines, herbs, non-prescription drugs, or dietary supplements you use. Also tell them if you smoke, drink alcohol, or use illegal drugs. Some items may interact with your medicine. What should I watch for while using this medicine? Visit your doctor or health care professional for regular checks on your progress. Check your blood pressure as directed. Ask your doctor or health care professional what your blood pressure should be and when you should contact him or her. Call your doctor or health care professional if you notice  an irregular or fast heart beat. Women should inform their doctor if they wish to become pregnant or think they might be pregnant. There is a potential for serious side effects to an unborn child, particularly in the second or third trimester. Talk to your health care professional or pharmacist for more information. You may get drowsy or dizzy. Do not drive, use machinery, or do anything that needs mental alertness until  you know how this drug affects you. Do not stand or sit up quickly, especially if you are an older patient. This reduces the risk of dizzy or fainting spells. Alcohol can make you more drowsy and dizzy. Avoid alcoholic drinks. Avoid salt substitutes unless you are told otherwise by your doctor or health care professional. Do not treat yourself for coughs, colds, or pain while you are taking this medicine without asking your doctor or health care professional for advice. Some ingredients may increase your blood pressure. What side effects may I notice from receiving this medicine? Side effects that you should report to your doctor or health care professional as soon as possible:  confusion, dizziness, light headedness or fainting spells  decreased amount of urine passed  difficulty breathing or swallowing, hoarseness, or tightening of the throat  fast or irregular heart beat, palpitations, or chest pain  skin rash, itching  swelling of your face, lips, tongue, hands, or feet Side effects that usually do not require medical attention (report to your doctor or health care professional if they continue or are bothersome):  cough  decreased sexual function or desire  headache  nasal congestion or stuffiness  nausea or stomach pain  sore or cramping muscles This list may not describe all possible side effects. Call your doctor for medical advice about side effects. You may report side effects to FDA at 1-800-FDA-1088. Where should I keep my medicine? Keep out of the reach of children and pets. Store at room temperature between 15 and 30 degrees C (59 and 86 degrees F). Protect from light. Keep the container tightly closed. Throw away any unused drug after the expiration date. NOTE: This sheet is a summary. It may not cover all possible information. If you have questions about this medicine, talk to your doctor, pharmacist, or health care provider.  2020 Elsevier/Gold Standard  (2019-01-02 12:12:28)

## 2019-09-16 ENCOUNTER — Ambulatory Visit (INDEPENDENT_AMBULATORY_CARE_PROVIDER_SITE_OTHER): Payer: Medicare Other | Admitting: *Deleted

## 2019-09-16 DIAGNOSIS — I5032 Chronic diastolic (congestive) heart failure: Secondary | ICD-10-CM | POA: Diagnosis not present

## 2019-09-16 LAB — CUP PACEART REMOTE DEVICE CHECK
Date Time Interrogation Session: 20210401014159
Implantable Pulse Generator Implant Date: 20200710

## 2019-09-17 NOTE — Progress Notes (Signed)
ILR Remote 

## 2019-09-23 ENCOUNTER — Ambulatory Visit (HOSPITAL_COMMUNITY)
Admission: RE | Admit: 2019-09-23 | Discharge: 2019-09-23 | Disposition: A | Discharge status: Home / Self Care | Payer: Medicare Other | Source: Ambulatory Visit | Attending: Physician Assistant | Admitting: Physician Assistant

## 2019-09-23 ENCOUNTER — Encounter (HOSPITAL_COMMUNITY): Payer: Self-pay | Admitting: Physician Assistant

## 2019-09-23 ENCOUNTER — Other Ambulatory Visit: Payer: Self-pay

## 2019-09-23 VITALS — BP 138/70 | HR 62 | Ht <= 58 in | Wt 208.6 lb

## 2019-09-23 DIAGNOSIS — M109 Gout, unspecified: Secondary | ICD-10-CM | POA: Insufficient documentation

## 2019-09-23 DIAGNOSIS — I5032 Chronic diastolic (congestive) heart failure: Secondary | ICD-10-CM | POA: Diagnosis not present

## 2019-09-23 DIAGNOSIS — Z9841 Cataract extraction status, right eye: Secondary | ICD-10-CM | POA: Insufficient documentation

## 2019-09-23 DIAGNOSIS — E785 Hyperlipidemia, unspecified: Secondary | ICD-10-CM | POA: Diagnosis not present

## 2019-09-23 DIAGNOSIS — Z6841 Body Mass Index (BMI) 40.0 and over, adult: Secondary | ICD-10-CM | POA: Insufficient documentation

## 2019-09-23 DIAGNOSIS — Z9119 Patient's noncompliance with other medical treatment and regimen: Secondary | ICD-10-CM | POA: Insufficient documentation

## 2019-09-23 DIAGNOSIS — Z825 Family history of asthma and other chronic lower respiratory diseases: Secondary | ICD-10-CM | POA: Insufficient documentation

## 2019-09-23 DIAGNOSIS — Z79890 Hormone replacement therapy (postmenopausal): Secondary | ICD-10-CM | POA: Insufficient documentation

## 2019-09-23 DIAGNOSIS — I2510 Atherosclerotic heart disease of native coronary artery without angina pectoris: Secondary | ICD-10-CM | POA: Insufficient documentation

## 2019-09-23 DIAGNOSIS — Z7901 Long term (current) use of anticoagulants: Secondary | ICD-10-CM | POA: Diagnosis not present

## 2019-09-23 DIAGNOSIS — K219 Gastro-esophageal reflux disease without esophagitis: Secondary | ICD-10-CM | POA: Insufficient documentation

## 2019-09-23 DIAGNOSIS — I480 Paroxysmal atrial fibrillation: Secondary | ICD-10-CM | POA: Diagnosis not present

## 2019-09-23 DIAGNOSIS — D6869 Other thrombophilia: Secondary | ICD-10-CM | POA: Diagnosis not present

## 2019-09-23 DIAGNOSIS — Z8249 Family history of ischemic heart disease and other diseases of the circulatory system: Secondary | ICD-10-CM | POA: Diagnosis not present

## 2019-09-23 DIAGNOSIS — Z800 Family history of malignant neoplasm of digestive organs: Secondary | ICD-10-CM | POA: Diagnosis not present

## 2019-09-23 DIAGNOSIS — Z841 Family history of disorders of kidney and ureter: Secondary | ICD-10-CM | POA: Diagnosis not present

## 2019-09-23 DIAGNOSIS — E039 Hypothyroidism, unspecified: Secondary | ICD-10-CM | POA: Diagnosis not present

## 2019-09-23 DIAGNOSIS — Z888 Allergy status to other drugs, medicaments and biological substances status: Secondary | ICD-10-CM | POA: Diagnosis not present

## 2019-09-23 DIAGNOSIS — I4891 Unspecified atrial fibrillation: Secondary | ICD-10-CM | POA: Diagnosis present

## 2019-09-23 DIAGNOSIS — Z9842 Cataract extraction status, left eye: Secondary | ICD-10-CM | POA: Insufficient documentation

## 2019-09-23 DIAGNOSIS — Z881 Allergy status to other antibiotic agents status: Secondary | ICD-10-CM | POA: Insufficient documentation

## 2019-09-23 DIAGNOSIS — Z7983 Long term (current) use of bisphosphonates: Secondary | ICD-10-CM | POA: Diagnosis not present

## 2019-09-23 DIAGNOSIS — Z79899 Other long term (current) drug therapy: Secondary | ICD-10-CM | POA: Diagnosis not present

## 2019-09-23 DIAGNOSIS — G4733 Obstructive sleep apnea (adult) (pediatric): Secondary | ICD-10-CM | POA: Insufficient documentation

## 2019-09-23 DIAGNOSIS — I110 Hypertensive heart disease with heart failure: Secondary | ICD-10-CM | POA: Insufficient documentation

## 2019-09-23 LAB — BASIC METABOLIC PANEL WITH GFR
Anion gap: 10 (ref 5–15)
BUN: 25 mg/dL — ABNORMAL HIGH (ref 8–23)
CO2: 27 mmol/L (ref 22–32)
Calcium: 9.4 mg/dL (ref 8.9–10.3)
Chloride: 107 mmol/L (ref 98–111)
Creatinine, Ser: 1.11 mg/dL — ABNORMAL HIGH (ref 0.44–1.00)
GFR calc Af Amer: 57 mL/min — ABNORMAL LOW
GFR calc non Af Amer: 49 mL/min — ABNORMAL LOW
Glucose, Bld: 96 mg/dL (ref 70–99)
Potassium: 4.7 mmol/L (ref 3.5–5.1)
Sodium: 144 mmol/L (ref 135–145)

## 2019-09-23 LAB — MAGNESIUM: Magnesium: 2.3 mg/dL (ref 1.7–2.4)

## 2019-09-23 NOTE — Progress Notes (Signed)
Primary Care Physician: [name], DO Primary Cardiologist: none Primary Electrophysiologist: Dr Rayann Heman Referring Physician: Dr Lorne Skeens is a 74 y.o. female with a history of CAD, chronic HFpEF, HTN, MAT, OSA intolerant of CPAP, and paroxysmal atrial fibrillation who presents for follow up in the [facility]. She is on Eliquis for a CHADS2VASC score of 4. Patient was seen for HTN on 09/12/19. Losartan was started at that time. Her ILR shows 0.4% afib burden. She denies bleeding issues on anticoagulation. She reports her BP at home has been much better controlled and within the normal range.   Today, she denies symptoms of palpitations, chest pain, shortness of breath, orthopnea, PND, lower extremity edema, dizziness, presyncope, syncope, bleeding, or neurologic sequela. The patient is tolerating medications without difficulties and is otherwise without complaint today.    Atrial Fibrillation Risk Factors:  she does have symptoms or diagnosis of sleep apnea. she is not compliant with CPAP therapy. she does not have a history of rheumatic fever. she does not have a history of alcohol use.   she has a BMI of Body mass index is 46.77 kg/m.Marland Kitchen Filed Weights   09/23/19 1031  Weight: 94.6 kg    Family History  Problem Relation Age of Onset  . Colon cancer Mother        mets, spot on lungs and spine  . Coronary artery disease Father   . Emphysema Father   . Heart attack Brother        x 2  . Colon cancer Paternal Uncle   . Kidney disease Brother   . Hypertension Brother   . Stroke Neg Hx   . Esophageal cancer Neg Hx      Atrial Fibrillation Management history:  Previous antiarrhythmic drugs: amiodarone (stopped 2/2 tremor), sotalol Previous cardioversions: none Previous ablations: none CHADS2VASC score: 4 Anticoagulation history: Eliquis   Past Medical History:  Diagnosis Date  . Anemia   . Bilateral leg edema   . C.  difficile enteritis   . CAD (coronary artery disease)    a. LHC 03/2006: EF 65%, mid LAD 40-50%, ostial D1 70-80%, normal circumflex, normal RCA;  b. Lexiscan Myoview 09/2010: No ischemia or scar, EF 75%;  c. Lex MV 5/14:  Normal, no ischemia, EF 71% // Myoview 08/2018: EF 43, normal perfusion; Intermediate Risk due to low EF (Echo 07/2018 with normal EF)   . Cataract    bilateral-had surgery  . Cellulitis of right leg    Hx - resolved  . Chronic diastolic CHF (congestive heart failure) (HCC)    Echo 8/14:  Mild LVH, EF 55-65%, normal wall motion, Tr AI, mild MR, mod TR, PASP 45 // 12/2014 Echo: EF 60-65%, nl wall motion, mildly dil RA/LA, PASP 6mHg.// Echo 07/2018: EF 60-65, mild LVH, mod diastolic dysfunction, normal RVSF, severe LAE, mod MAC, mild MR, mild TR   . Continuous urine leakage   . [facility]   . Deviated septum   . Essential hypertension   . Family history of adverse reaction to anesthesia    .' MY SON IS A DIFFICULT INTUBATION"  . Gall stones    a. 08/2014 Abd U/S:  Large gallstone measuring 4.2 cm.  .Marland KitchenGERD (gastroesophageal reflux disease)   . Gout   . Hiatal hernia   . History of pancreatitis   . History of PFTs    a. PFTs 10/2012: normal.   . Hyperlipidemia   . Hypothyroidism   .  Internal hemorrhoids   . Morbid obesity ([place])   . Multifocal atrial tachycardia ([place]) 08/10/2018   Noted during admission in 07/2018  . Osteoarthritis    spine hips knees  . PAF (paroxysmal atrial fibrillation) (HCC)    a. CHA2DS2VASc = 5-->poor OAC candidate 2/2 falls. No problems now per patient 03/2016  . Pneumonia   . Seizures ([place])    as a baby  . Shingles    Hx  . Sleep apnea    Does not use CPAP  . Spinal cord compression ([place]) 02/21/2014   Past Surgical History:  Procedure Laterality Date  . CATARACT EXTRACTION, BILATERAL Bilateral   . COLONOSCOPY  03/2011  . Implantable loop recorder placement  12/21/2018   Medtronic Reveal [place] model [id number] (SN [id number])  implanted by Dr Rayann Heman in office for AF management  . NASAL SEPTUM SURGERY    . TOTAL ABDOMINAL HYSTERECTOMY    . VERTEBROPLASTY  Sept 7 and May 30, 2014   x 2, T11/T12  . WISDOM TOOTH EXTRACTION      Current Outpatient Medications  Medication Sig Dispense Refill  . acetaminophen (TYLENOL) 500 MG tablet Take 500-1,000 mg by mouth every 6 (six) hours as needed for mild pain or fever.     Marland Kitchen alendronate (FOSAMAX) 70 MG tablet Take 1 tablet by mouth every Tuesday.     Marland Kitchen allopurinol (ZYLOPRIM) 100 MG tablet Take 100 mg by mouth 2 (two) times daily.     Marland Kitchen CALCIUM PO Take 500 mg by mouth daily.    . cetirizine (ZYRTEC) 10 MG tablet Take 10 mg by mouth daily.    . Cholecalciferol (VITAMIN D3) 2000 UNITS TABS Take 2,000 Units by mouth daily.     Marland Kitchen ELIQUIS 5 MG TABS tablet TAKE 1 TABLET BY MOUTH TWICE A DAY 60 tablet 5  . famotidine (PEPCID) 20 MG tablet Take 20 mg by mouth daily.    . folic acid (FOLVITE) 1 MG tablet Take 1 mg by mouth 4 (four) times daily.    . furosemide (LASIX) 40 MG tablet Take 1 tablet (40 mg total) by mouth 2 (two) times daily. 120 tablet 5  . levothyroxine (SYNTHROID) 88 MCG tablet Take 88 mcg by mouth daily.     Marland Kitchen losartan (COZAAR) 25 MG tablet Take 1 tablet (25 mg total) by mouth daily. 90 tablet 3  . methotrexate (RHEUMATREX) 2.5 MG tablet Take 12.5 mg by mouth every Wednesday.   3  . metoprolol succinate (TOPROL XL) 25 MG 24 hr tablet Take 1.5 tablets (37.74m) by mouth twice a day 270 tablet 3  . Multiple Vitamin (MULTIVITAMIN) tablet Take 1 tablet by mouth daily.      .Marland KitchenOVER THE COUNTER MEDICATION Place 1 drop into both eyes daily as needed (for dry eyes. Advanced eye relief).    . phenylephrine-shark liver oil-mineral oil-petrolatum (PREPARATION H) 0.25-3-14-71.9 % rectal ointment Place 1 application rectally as needed for hemorrhoids.    . potassium chloride SA (KLOR-CON M20) 20 MEQ tablet Take 1 tablet (20 mEq total) by mouth 3 (three) times daily. 60 tablet 11  .  predniSONE (DELTASONE) 5 MG tablet Take 5 mg by mouth daily with breakfast.    . rosuvastatin (CRESTOR) 5 MG tablet Take 5 mg by mouth every other day.     . sotalol (BETAPACE) 80 MG tablet Take 1 tablet (80 mg total) by mouth 2 (two) times daily. 180 tablet 3  . vitamin B-12 (CYANOCOBALAMIN) 1000 MCG tablet Take 1,000 mcg  by mouth daily.     No current facility-administered medications for this encounter.    Allergies  Allergen Reactions  . Butoconazole Itching, Rash and Other (See Comments)    'Redness and swelling feverish ' 'worse symptoms '  . Keflex [Cephalexin] Diarrhea  . Lipitor [Atorvastatin Calcium] Other (See Comments)    'Muscle weakness'      Social History   Socioeconomic History  . Marital status: Married    Spouse name: Not on file  . Number of children: 2  . Years of education: Not on file  . Highest education level: Not on file  Occupational History    Employer: UNEMPLOYED  Tobacco Use  . Smoking status: Never Smoker  . Smokeless tobacco: Never Used  Substance and Sexual Activity  . Alcohol use: No  . Drug use: No  . Sexual activity: Not Currently    Birth control/protection: Post-menopausal  Other Topics Concern  . Not on file  Social History Narrative  . Not on file   Social Determinants of Health   Financial Resource Strain:   . Difficulty of Paying Living Expenses:   Food Insecurity:   . Worried About Charity fundraiser in the Last Year:   . Arboriculturist in the Last Year:   Transportation Needs:   . Film/video editor (Medical):   Marland Kitchen Lack of Transportation (Non-Medical):   Physical Activity:   . Days of Exercise per Week:   . Minutes of Exercise per Session:   Stress:   . Feeling of Stress :   Social Connections:   . Frequency of Communication with Friends and Family:   . Frequency of Social Gatherings with Friends and Family:   . Attends Religious Services:   . Active Member of Clubs or Organizations:   . Attends Theatre manager Meetings:   Marland Kitchen Marital Status:   Intimate Partner Violence:   . Fear of Current or Ex-Partner:   . Emotionally Abused:   Marland Kitchen Physically Abused:   . Sexually Abused:      ROS- All systems are reviewed and negative except as per the HPI above.  Physical Exam: Vitals:   09/23/19 1031  BP: 138/70  Pulse: 62  Weight: 94.6 kg  Height: _0  (1.422 m)    GEN- The patient is well appearing obese elderly female, alert and oriented x 3 today.   HEENT-head normocephalic, atraumatic, sclera clear, conjunctiva pink, hearing intact, trachea midline. Lungs- Clear to ausculation bilaterally, normal work of breathing Heart- Regular rate and rhythm, no murmurs, rubs or gallops  GI- soft, NT, ND, + BS Extremities- no clubbing, cyanosis. Non pitting edema MS- no significant deformity or atrophy Skin- no rash or lesion Psych- euthymic mood, full affect Neuro- strength and sensation are intact   Wt Readings from Last 3 Encounters:  09/23/19 94.6 kg  09/12/19 93.2 kg  06/24/19 91.8 kg    EKG today demonstrates SR HR 62, PAC, NST, PR 164, QRS 70, QTc 448  Echo 07/26/18 demonstrated  1. The left ventricle has normal systolic function with an ejection fraction of 60-65%. The cavity size was normal. There is mildly increased left ventricular wall thickness. Left ventricular diastolic Doppler parameters are consistent with  pseudonormalization.  2. The right ventricle has normal systolic function. The cavity was normal. There is no increase in right ventricular wall thickness.  3. Left atrial size was severely dilated.  4. The mitral valve is normal in structure. There is mild thickening.  There is moderate mitral annular calcification present.  5. The tricuspid valve is normal in structure.  6. The aortic valve is tricuspid There is mild thickening of the aortic valve.  7. The pulmonic valve was normal in structure.  8. Normal LV systolic function; mild LVH; moderate diastolic  dysfunction; moderate LAE; mild MR and TR.  9. Right atrial pressure is estimated at 3 mmHg.  Epic records are reviewed at length today  Assessment and Plan:  1. Paroxysmal atrial fibrillation ILR shows low afib burden 0.4%   Continue sotalol 80 mg BID. QT stable.  Check bmet/mag today. Continue Eliquis 5 mg BID  This patients CHA2DS2-VASc Score and unadjusted Ischemic Stroke Rate (% per year) is equal to 4.8 % stroke rate/year from a score of 4  Above score calculated as 1 point each if present [CHF, HTN, DM, Vascular=MI/PAD/Aortic Plaque, Age if 65-74, or Female] Above score calculated as 2 points each if present [Age > 75, or Stroke/TIA/TE]  2. Obesity Body mass index is 46.77 kg/m. Lifestyle modification was discussed and encouraged including regular physical activity and weight reduction.  3. Obstructive sleep apnea Patient is not on CPAP.  4. CAD No anginal symptoms.  5. HTN Stable with losartan, no changes today. Recheck bmet as above.   Follow up in the AF clinic in 4 months.    [facility] [address] [place], [place] [address] [phone] 09/23/2019 11:10 AM

## 2019-10-17 LAB — CUP PACEART REMOTE DEVICE CHECK
Date Time Interrogation Session: 20210502014455
Implantable Pulse Generator Implant Date: 20200710

## 2019-10-21 ENCOUNTER — Ambulatory Visit (INDEPENDENT_AMBULATORY_CARE_PROVIDER_SITE_OTHER): Payer: Medicare Other | Admitting: *Deleted

## 2019-10-21 DIAGNOSIS — I5032 Chronic diastolic (congestive) heart failure: Secondary | ICD-10-CM

## 2019-10-21 DIAGNOSIS — I480 Paroxysmal atrial fibrillation: Secondary | ICD-10-CM | POA: Diagnosis not present

## 2019-10-22 NOTE — Progress Notes (Signed)
[facility] Summary Report / Loop Recorder 

## 2019-11-04 ENCOUNTER — Telehealth: Payer: Self-pay

## 2019-11-04 DIAGNOSIS — Z79899 Other long term (current) drug therapy: Secondary | ICD-10-CM | POA: Diagnosis not present

## 2019-11-04 DIAGNOSIS — M150 Primary generalized (osteo)arthritis: Secondary | ICD-10-CM | POA: Diagnosis not present

## 2019-11-04 DIAGNOSIS — M549 Dorsalgia, unspecified: Secondary | ICD-10-CM | POA: Diagnosis not present

## 2019-11-04 DIAGNOSIS — M810 Age-related osteoporosis without current pathological fracture: Secondary | ICD-10-CM | POA: Diagnosis not present

## 2019-11-04 DIAGNOSIS — M3390 Dermatopolymyositis, unspecified, organ involvement unspecified: Secondary | ICD-10-CM | POA: Diagnosis not present

## 2019-11-04 NOTE — Telephone Encounter (Signed)
Patient called to let us know that she had a fall and she was seen in the emergency room and she has been having a hard time getting around in her legs and she just wanted to let us know her readings might be off. Nothing heart related is a issue for her. She will keep [place] posted .

## 2019-11-13 ENCOUNTER — Ambulatory Visit (INDEPENDENT_AMBULATORY_CARE_PROVIDER_SITE_OTHER): Payer: Medicare Other

## 2019-11-13 ENCOUNTER — Ambulatory Visit
Admission: EM | Admit: 2019-11-13 | Discharge: 2019-11-13 | Disposition: A | Discharge status: Home / Self Care | Payer: Medicare Other | Attending: Physician Assistant | Admitting: Physician Assistant

## 2019-11-13 ENCOUNTER — Other Ambulatory Visit: Payer: Self-pay

## 2019-11-13 DIAGNOSIS — L089 Local infection of the skin and subcutaneous tissue, unspecified: Secondary | ICD-10-CM

## 2019-11-13 DIAGNOSIS — W010XXA Fall on same level from slipping, tripping and stumbling without subsequent striking against object, initial encounter: Secondary | ICD-10-CM

## 2019-11-13 DIAGNOSIS — R609 Edema, unspecified: Secondary | ICD-10-CM

## 2019-11-13 DIAGNOSIS — S8992XA Unspecified injury of left lower leg, initial encounter: Secondary | ICD-10-CM | POA: Diagnosis not present

## 2019-11-13 DIAGNOSIS — M25562 Pain in left knee: Secondary | ICD-10-CM | POA: Diagnosis not present

## 2019-11-13 MED ORDER — MUPIROCIN CALCIUM 2 % EX CREA: TOPICAL_CREAM | CUTANEOUS | 0 refills | Status: DC

## 2019-11-13 NOTE — Discharge Instructions (Addendum)
Apply antibiotic ointment to rash.  Elevate legs.  Schedule to see your Cardiologist for evaluation

## 2019-11-13 NOTE — ED Triage Notes (Signed)
Pt states she tripped and fell 2wks ago and landed on both knees. Pt states now has rash and blisters to LLE since yesterday. Pt c/o pain to lt knees.

## 2019-11-14 ENCOUNTER — Telehealth: Payer: Self-pay | Admitting: Internal Medicine

## 2019-11-14 NOTE — Telephone Encounter (Signed)
New Message  Pt c/o swelling: STAT is pt has developed SOB within 24 hours  1) How much weight have you gained and in what time span? 15 lbs in 2 weeks  2) If swelling, where is the swelling located? Left leg and foot  3) Are you currently taking a fluid pill? Yes  4) Are you currently SOB? No  5) Do you have a log of your daily weights (if so, list)? No  6) Have you gained 3 pounds in a day or 5 pounds in a week? Yes  7) Have you traveled recently? No

## 2019-11-14 NOTE — Telephone Encounter (Signed)
Returned call to pt.  Per Pt she fell a few weeks ago and now has significant bruising to her left leg from the knee down to her toes and a smaller bruise on her right leg.    After further discussion, it sounds like Pt had had increasing lower extremity edema prior to her fall which may have contributed to her fall (?).  Pt went to urgent care yesterday, xray ruled out fracture.  Was advised to take an additional fluid pill and call cardiology office.  Discussed with DOD.  Will have Pt continue lasix 80 mg/40 mg over the weekend and follow up with DC at afib clinic on Monday 11/18/2019 to evaluate edema and possibly check kidney function.  Pt notified and indicates understanding.  Also advised Pt that main phone number to office is availability 24 hours a day for urgent needs.

## 2019-11-15 NOTE — ED Provider Notes (Signed)
[facility]    CSN: [id number] Arrival date & time: 11/13/19  1021      History   Chief Complaint Chief Complaint  Patient presents with  . Knee Pain    HPI [name] is a 74 y.o. female.   The history is provided by the patient. No language interpreter was used.  Knee Pain Location:  Knee Injury: no   Knee location:  L knee Pain details:    Quality:  Aching   Radiates to:  Does not radiate   Severity:  No pain   Timing:  Constant   Progression:  Worsening Chronicity:  New Dislocation: no   Foreign body present:  No foreign bodies Relieved by:  Nothing Worsened by:  Nothing Pt reports she fell and hit her knee.  Pt reports area has been swollen, Pt reports she has blister taht came up from swelling and now look infected  Past Medical History:  Diagnosis Date  . Anemia   . Bilateral leg edema   . C. difficile enteritis   . CAD (coronary artery disease)    a. LHC 03/2006: EF 65%, mid LAD 40-50%, ostial D1 70-80%, normal circumflex, normal RCA;  b. Lexiscan Myoview 09/2010: No ischemia or scar, EF 75%;  c. Lex MV 5/14:  Normal, no ischemia, EF 71% // Myoview 08/2018: EF 43, normal perfusion; Intermediate Risk due to low EF (Echo 07/2018 with normal EF)   . Cataract    bilateral-had surgery  . Cellulitis of right leg    Hx - resolved  . Chronic diastolic CHF (congestive heart failure) (HCC)    Echo 8/14:  Mild LVH, EF 55-65%, normal wall motion, Tr AI, mild MR, mod TR, PASP 45 // 12/2014 Echo: EF 60-65%, nl wall motion, mildly dil RA/LA, PASP 43mHg.// Echo 07/2018: EF 60-65, mild LVH, mod diastolic dysfunction, normal RVSF, severe LAE, mod MAC, mild MR, mild TR   . Continuous urine leakage   . Dermatomyositis ([place]   . Deviated septum   . Essential hypertension   . Family history of adverse reaction to anesthesia    .' MY SON IS A DIFFICULT INTUBATION"  . Gall stones    a. 08/2014 Abd U/S:  Large gallstone measuring 4.2 cm.  .Marland KitchenGERD (gastroesophageal  reflux disease)   . Gout   . Hiatal hernia   . History of pancreatitis   . History of PFTs    a. PFTs 10/2012: normal.   . Hyperlipidemia   . Hypothyroidism   . Internal hemorrhoids   . Morbid obesity ([place]   . Multifocal atrial tachycardia ([facility] [place] 08/10/2018   Noted during admission in 07/2018  . Osteoarthritis    spine hips knees  . PAF (paroxysmal atrial fibrillation) (HCC)    a. CHA2DS2VASc = 5-->poor OAC candidate 2/2 falls. No problems now per patient 03/2016  . Pneumonia   . Seizures ([place]    as a baby  . Shingles    Hx  . Sleep apnea    Does not use CPAP  . Spinal cord compression ([place] 02/21/2014    Patient Active Problem List   Diagnosis Date Noted  . Secondary hypercoagulable state ([place] 06/24/2019  . Multifocal atrial tachycardia ([facility] 08/10/2018  . Chest pain   . Cellulitis, bilateral LE but R>L, abd wall  08/13/2015  . Sepsis due to cellulitis ([place] 08/13/2015  . Acute kidney injury ([facility] 08/13/2015  . Hypokalemia 08/13/2015  . History of iron deficiency anemia 08/13/2015  . Thrombocytopenia (  [place]) 08/13/2015  . Sepsis ([place]) 08/13/2015  . Chronic diastolic CHF (congestive heart failure) ([place])   . Paroxysmal atrial fibrillation (HCC)   . Essential hypertension   . Dermatomyositis ([place]) 02/18/2014  . Morbid obesity ([place]) 12/09/2013  . CAD (coronary artery disease) 09/25/2008  . LEG EDEMA, BILATERAL 09/20/2008    Past Surgical History:  Procedure Laterality Date  . CATARACT EXTRACTION, BILATERAL Bilateral   . COLONOSCOPY  03/2011  . Implantable loop recorder placement  12/21/2018   Medtronic Reveal [place] model [id number] (SN [id number]) implanted by Dr Rayann Heman in office for AF management  . NASAL SEPTUM SURGERY    . TOTAL ABDOMINAL HYSTERECTOMY    . VERTEBROPLASTY  Sept 7 and May 30, 2014   x 2, T11/T12  . WISDOM TOOTH EXTRACTION      OB History   No obstetric history on file.      Home Medications    Prior to Admission medications   Medication Sig  Start Date End Date Taking? Authorizing Provider  acetaminophen (TYLENOL) 500 MG tablet Take 500-1,000 mg by mouth every 6 (six) hours as needed for mild pain or fever.     [provider]  alendronate (FOSAMAX) 70 MG tablet Take 1 tablet by mouth every Tuesday.  04/01/15   [provider]  allopurinol (ZYLOPRIM) 100 MG tablet Take 100 mg by mouth 2 (two) times daily.  01/14/16   [provider]  CALCIUM PO Take 500 mg by mouth daily.    [provider]  cetirizine (ZYRTEC) 10 MG tablet Take 10 mg by mouth daily.    [provider]  Cholecalciferol (VITAMIN D3) 2000 UNITS TABS Take 2,000 Units by mouth daily.     [provider]  ELIQUIS 5 MG TABS tablet TAKE 1 TABLET BY MOUTH TWICE A DAY 09/09/19   Allred, Jeneen Rinks, MD  famotidine (PEPCID) 20 MG tablet Take 20 mg by mouth daily.    [provider]  folic acid (FOLVITE) 1 MG tablet Take 1 mg by mouth 4 (four) times daily.    [provider]  furosemide (LASIX) 40 MG tablet Take 1 tablet (40 mg total) by mouth 2 (two) times daily. 08/18/15   [name], [name], MD  levothyroxine (SYNTHROID) 88 MCG tablet Take 88 mcg by mouth daily.  07/07/17   [provider]  losartan (COZAAR) 25 MG tablet Take 1 tablet (25 mg total) by mouth daily. 09/12/19 12/11/19  Shirley Friar, PA-C  methotrexate (RHEUMATREX) 2.5 MG tablet Take 12.5 mg by mouth every Wednesday.  09/17/14   [provider]  metoprolol succinate (TOPROL XL) 25 MG 24 hr tablet Take 1.5 tablets (37.65m) by mouth twice a day 03/01/19   AThompson Grayer MD  Multiple Vitamin (MULTIVITAMIN) tablet Take 1 tablet by mouth daily.      [provider]  mupirocin cream (BACTROBAN) 2 % Apply to affected area 3 times daily 11/13/19 11/12/20  SFransico Meadow PA-C  OVER THE COUNTER MEDICATION Place 1 drop into both eyes daily as needed (for dry eyes. Advanced eye relief).    [provider]  phenylephrine-shark  liver oil-mineral oil-petrolatum (PREPARATION H) 0.25-3-14-71.9 % rectal ointment Place 1 application rectally as needed for hemorrhoids.    [provider]  potassium chloride SA (KLOR-CON M20) 20 MEQ tablet Take 1 tablet (20 mEq total) by mouth 3 (three) times daily. 08/18/15   [name], DSilver Huguenin MD  predniSONE (DELTASONE) 5 MG tablet Take 5 mg by mouth  daily with breakfast.    [provider]  rosuvastatin (CRESTOR) 5 MG tablet Take 5 mg by mouth every other day.     [provider]  sotalol (BETAPACE) 80 MG tablet Take 1 tablet (80 mg total) by mouth 2 (two) times daily. 03/01/19   Allred, Jeneen Rinks, MD  vitamin B-12 (CYANOCOBALAMIN) 1000 MCG tablet Take 1,000 mcg by mouth daily.    [provider]    Family History Family History  Problem Relation Age of Onset  . Colon cancer Mother        mets, spot on lungs and spine  . Coronary artery disease Father   . Emphysema Father   . Heart attack Brother        x 2  . Colon cancer Paternal Uncle   . Kidney disease Brother   . Hypertension Brother   . Stroke Neg Hx   . Esophageal cancer Neg Hx     Social History Social History   Tobacco Use  . Smoking status: Never Smoker  . Smokeless tobacco: Never Used  Substance Use Topics  . Alcohol use: No  . Drug use: No     Allergies   Butoconazole, Keflex [cephalexin], and Lipitor [atorvastatin calcium]   Review of Systems Review of Systems  All other systems reviewed and are negative.    Physical Exam Triage Vital Signs ED Triage Vitals  Enc Vitals Group     BP 11/13/19 1039 123/77     Pulse Rate 11/13/19 1039 71     Resp 11/13/19 1039 18     Temp 11/13/19 1039 98.5 F (36.9 C)     Temp Source 11/13/19 1039 Oral     SpO2 11/13/19 1039 96 %     Weight --      Height --      Head Circumference --      Peak Flow --      Pain Score 11/13/19 1040 3     Pain Loc --      Pain Edu? --      Excl. in [place]? --    No data found.  Updated Vital  Signs BP 123/77 (BP Location: Left Arm)   Pulse 71   Temp 98.5 F (36.9 C) (Oral)   Resp 18   SpO2 96%   Visual Acuity Right Eye Distance:   Left Eye Distance:   Bilateral Distance:    Right Eye Near:   Left Eye Near:    Bilateral Near:     Physical Exam Vitals reviewed.  Constitutional:      Appearance: Normal appearance.  Cardiovascular:     Rate and Rhythm: Normal rate.  Pulmonary:     Effort: Pulmonary effort is normal.  Musculoskeletal:        General: Swelling and tenderness present.  Skin:    Findings: Erythema present.     Comments: Blisering,  Few honey combed areas   Neurological:     General: No focal deficit present.     Mental Status: She is alert.  Psychiatric:        Mood and Affect: Mood normal.      UC Treatments / Results  Labs (all labs ordered are listed, but only abnormal results are displayed) Labs Reviewed - No data to display  EKG   Radiology No results found.  Procedures Procedures (including critical care time)  Medications Ordered in UC Medications - No data to display  Initial Impression / Assessment and Plan /  UC Course  I have reviewed the triage vital signs and the nursing notes.  Pertinent labs & imaging results that were available during my care of the patient were reviewed by me and considered in my medical decision making (see chart for details).     MDM:  Xray no fracture.  Pt given rx for bactroban for possible early skin infection  Final Clinical Impressions(s) / UC Diagnoses   Final diagnoses:  Skin infection  Edema, unspecified type     Discharge Instructions     Apply antibiotic ointment to rash.  Elevate legs.  Schedule to see your Cardiologist for evaluation    ED Prescriptions    Medication Sig Dispense Auth. Provider   mupirocin cream (BACTROBAN) 2 % Apply to affected area 3 times daily 30 g Fransico Meadow, [place]    An After Visit Summary was printed and given to the patient.  PDMP not  reviewed this encounter.   Fransico Meadow, [place] 11/15/19 1825

## 2019-11-18 ENCOUNTER — Other Ambulatory Visit: Payer: Self-pay

## 2019-11-18 ENCOUNTER — Ambulatory Visit (HOSPITAL_COMMUNITY)
Admission: RE | Admit: 2019-11-18 | Discharge: 2019-11-18 | Disposition: A | Discharge status: Home / Self Care | Payer: Medicare Other | Source: Ambulatory Visit | Attending: Nurse Practitioner | Admitting: Nurse Practitioner

## 2019-11-18 ENCOUNTER — Encounter (HOSPITAL_COMMUNITY): Payer: Self-pay | Admitting: Nurse Practitioner

## 2019-11-18 VITALS — BP 100/52 | HR 67 | Ht <= 58 in | Wt 206.8 lb

## 2019-11-18 DIAGNOSIS — Z881 Allergy status to other antibiotic agents status: Secondary | ICD-10-CM | POA: Diagnosis not present

## 2019-11-18 DIAGNOSIS — Z79899 Other long term (current) drug therapy: Secondary | ICD-10-CM | POA: Insufficient documentation

## 2019-11-18 DIAGNOSIS — E669 Obesity, unspecified: Secondary | ICD-10-CM | POA: Insufficient documentation

## 2019-11-18 DIAGNOSIS — D649 Anemia, unspecified: Secondary | ICD-10-CM | POA: Insufficient documentation

## 2019-11-18 DIAGNOSIS — Z6841 Body Mass Index (BMI) 40.0 and over, adult: Secondary | ICD-10-CM | POA: Diagnosis not present

## 2019-11-18 DIAGNOSIS — K219 Gastro-esophageal reflux disease without esophagitis: Secondary | ICD-10-CM | POA: Insufficient documentation

## 2019-11-18 DIAGNOSIS — Z7901 Long term (current) use of anticoagulants: Secondary | ICD-10-CM | POA: Insufficient documentation

## 2019-11-18 DIAGNOSIS — D6869 Other thrombophilia: Secondary | ICD-10-CM | POA: Diagnosis not present

## 2019-11-18 DIAGNOSIS — Z8249 Family history of ischemic heart disease and other diseases of the circulatory system: Secondary | ICD-10-CM | POA: Insufficient documentation

## 2019-11-18 DIAGNOSIS — M159 Polyosteoarthritis, unspecified: Secondary | ICD-10-CM | POA: Insufficient documentation

## 2019-11-18 DIAGNOSIS — M109 Gout, unspecified: Secondary | ICD-10-CM | POA: Diagnosis not present

## 2019-11-18 DIAGNOSIS — Z888 Allergy status to other drugs, medicaments and biological substances status: Secondary | ICD-10-CM | POA: Insufficient documentation

## 2019-11-18 DIAGNOSIS — Z560 Unemployment, unspecified: Secondary | ICD-10-CM | POA: Diagnosis not present

## 2019-11-18 DIAGNOSIS — E039 Hypothyroidism, unspecified: Secondary | ICD-10-CM | POA: Insufficient documentation

## 2019-11-18 DIAGNOSIS — I480 Paroxysmal atrial fibrillation: Secondary | ICD-10-CM | POA: Diagnosis not present

## 2019-11-18 DIAGNOSIS — G4733 Obstructive sleep apnea (adult) (pediatric): Secondary | ICD-10-CM | POA: Diagnosis not present

## 2019-11-18 DIAGNOSIS — Z79890 Hormone replacement therapy (postmenopausal): Secondary | ICD-10-CM | POA: Insufficient documentation

## 2019-11-18 DIAGNOSIS — Z9119 Patient's noncompliance with other medical treatment and regimen: Secondary | ICD-10-CM | POA: Diagnosis not present

## 2019-11-18 DIAGNOSIS — I5032 Chronic diastolic (congestive) heart failure: Secondary | ICD-10-CM | POA: Insufficient documentation

## 2019-11-18 DIAGNOSIS — R600 Localized edema: Secondary | ICD-10-CM | POA: Diagnosis not present

## 2019-11-18 DIAGNOSIS — E785 Hyperlipidemia, unspecified: Secondary | ICD-10-CM | POA: Insufficient documentation

## 2019-11-18 DIAGNOSIS — Z7952 Long term (current) use of systemic steroids: Secondary | ICD-10-CM | POA: Insufficient documentation

## 2019-11-18 DIAGNOSIS — I110 Hypertensive heart disease with heart failure: Secondary | ICD-10-CM | POA: Insufficient documentation

## 2019-11-18 DIAGNOSIS — Z8781 Personal history of (healed) traumatic fracture: Secondary | ICD-10-CM | POA: Diagnosis not present

## 2019-11-18 DIAGNOSIS — I2510 Atherosclerotic heart disease of native coronary artery without angina pectoris: Secondary | ICD-10-CM | POA: Diagnosis not present

## 2019-11-18 LAB — BASIC METABOLIC PANEL
Anion gap: 13 (ref 5–15)
BUN: 24 mg/dL — ABNORMAL HIGH (ref 8–23)
CO2: 27 mmol/L (ref 22–32)
Calcium: 9.3 mg/dL (ref 8.9–10.3)
Chloride: 101 mmol/L (ref 98–111)
Creatinine, Ser: 1.24 mg/dL — ABNORMAL HIGH (ref 0.44–1.00)
GFR calc Af Amer: 50 mL/min — ABNORMAL LOW (ref >60)
GFR calc non Af Amer: 43 mL/min — ABNORMAL LOW (ref >60)
Glucose, Bld: 122 mg/dL — ABNORMAL HIGH (ref 70–99)
Potassium: 4.7 mmol/L (ref 3.5–5.1)
Sodium: 141 mmol/L (ref 135–145)

## 2019-11-18 LAB — MAGNESIUM: Magnesium: 2.1 mg/dL (ref 1.7–2.4)

## 2019-11-18 NOTE — Progress Notes (Signed)
Primary Care Physician: [name], DO Primary Cardiologist: none Primary Electrophysiologist: Dr Rayann Heman Referring Physician: Dr Lorne Skeens is a 74 y.o. female with a history of CAD, chronic HFpEF, HTN, MAT, OSA intolerant of CPAP, and paroxysmal atrial fibrillation who presents for follow up in the [facility] at the request of  Myrtie Hawk, RN. Pt had a fall 6/2 where she tripped over the ledge of her door. She came down hard on her left knee. She went to urgent care and they ruled out fx. Left knee now with area of pinkness. No bruising. Skin intact.Her legs have recently been swollen which is an acute on chronic issue. The swelling in left leg was worse after fall due to soft tissue injury. She is now on her third day of 80/40 increased lasix dose(usually 40/40). Her legs are large but are soft today. She states that she will go back to usual dose tomorrow as her swelling is back to baseline.will obtain a BMet/mag today. She is in SR with PAC's, continues on sotalol.Paceart 5/2 showed no arrhythmia's.   Today, she denies symptoms of palpitations, chest pain, shortness of breath, orthopnea, PND, lower extremity edema, dizziness, presyncope, syncope, bleeding, or neurologic sequela. The patient is tolerating medications without difficulties and is otherwise without complaint today.    Atrial Fibrillation Risk Factors:  she does have symptoms or diagnosis of sleep apnea. she is not compliant with CPAP therapy. she does not have a history of rheumatic fever. she does not have a history of alcohol use.   she has a BMI of Body mass index is 46.36 kg/m.Marland Kitchen Filed Weights   11/18/19 1325  Weight: 93.8 kg    Family History  Problem Relation Age of Onset  . Colon cancer Mother        mets, spot on lungs and spine  . Coronary artery disease Father   . Emphysema Father   . Heart attack Brother        x 2  . Colon cancer Paternal Uncle   . Kidney  disease Brother   . Hypertension Brother   . Stroke Neg Hx   . Esophageal cancer Neg Hx      Atrial Fibrillation Management history:  Previous antiarrhythmic drugs: amiodarone (stopped 2/2 tremor), sotalol Previous cardioversions: none Previous ablations: none CHADS2VASC score: 4 Anticoagulation history: Eliquis   Past Medical History:  Diagnosis Date  . Anemia   . Bilateral leg edema   . C. difficile enteritis   . CAD (coronary artery disease)    a. LHC 03/2006: EF 65%, mid LAD 40-50%, ostial D1 70-80%, normal circumflex, normal RCA;  b. Lexiscan Myoview 09/2010: No ischemia or scar, EF 75%;  c. Lex MV 5/14:  Normal, no ischemia, EF 71% // Myoview 08/2018: EF 43, normal perfusion; Intermediate Risk due to low EF (Echo 07/2018 with normal EF)   . Cataract    bilateral-had surgery  . Cellulitis of right leg    Hx - resolved  . Chronic diastolic CHF (congestive heart failure) (HCC)    Echo 8/14:  Mild LVH, EF 55-65%, normal wall motion, Tr AI, mild MR, mod TR, PASP 45 // 12/2014 Echo: EF 60-65%, nl wall motion, mildly dil RA/LA, PASP 38mHg.// Echo 07/2018: EF 60-65, mild LVH, mod diastolic dysfunction, normal RVSF, severe LAE, mod MAC, mild MR, mild TR   . Continuous urine leakage   . [place]   . Deviated septum   .  Essential hypertension   . Family history of adverse reaction to anesthesia    .' MY SON IS A DIFFICULT INTUBATION"  . Gall stones    a. 08/2014 Abd U/S:  Large gallstone measuring 4.2 cm.  Marland Kitchen GERD (gastroesophageal reflux disease)   . Gout   . Hiatal hernia   . History of pancreatitis   . History of PFTs    a. PFTs 10/2012: normal.   . Hyperlipidemia   . Hypothyroidism   . Internal hemorrhoids   . Morbid obesity ([place])   . Multifocal atrial tachycardia ([place]) 08/10/2018   Noted during admission in 07/2018  . Osteoarthritis    spine hips knees  . PAF (paroxysmal atrial fibrillation) (HCC)    a. CHA2DS2VASc = 5-->poor OAC candidate 2/2 falls. No  problems now per patient 03/2016  . Pneumonia   . Seizures ([place])    as a baby  . Shingles    Hx  . Sleep apnea    Does not use CPAP  . Spinal cord compression ([place]) 02/21/2014   Past Surgical History:  Procedure Laterality Date  . CATARACT EXTRACTION, BILATERAL Bilateral   . COLONOSCOPY  03/2011  . Implantable loop recorder placement  12/21/2018   Medtronic Reveal [place] model [id number] (SN [id number]) implanted by Dr Rayann Heman in office for AF management  . NASAL SEPTUM SURGERY    . TOTAL ABDOMINAL HYSTERECTOMY    . VERTEBROPLASTY  Sept 7 and May 30, 2014   x 2, T11/T12  . WISDOM TOOTH EXTRACTION      Current Outpatient Medications  Medication Sig Dispense Refill  . acetaminophen (TYLENOL) 500 MG tablet Take 500-1,000 mg by mouth every 6 (six) hours as needed for mild pain or fever.     Marland Kitchen alendronate (FOSAMAX) 70 MG tablet Take 1 tablet by mouth every Tuesday.     Marland Kitchen allopurinol (ZYLOPRIM) 100 MG tablet Take 100 mg by mouth 2 (two) times daily.     Marland Kitchen CALCIUM PO Take 500 mg by mouth daily.    . cetirizine (ZYRTEC) 10 MG tablet Take 10 mg by mouth daily.    . Cholecalciferol (VITAMIN D3) 2000 UNITS TABS Take 2,000 Units by mouth daily.     Marland Kitchen ELIQUIS 5 MG TABS tablet TAKE 1 TABLET BY MOUTH TWICE A DAY 60 tablet 5  . famotidine (PEPCID) 20 MG tablet Take 20 mg by mouth daily.    . folic acid (FOLVITE) 1 MG tablet Take 1 mg by mouth 4 (four) times daily.    . furosemide (LASIX) 40 MG tablet Take 1 tablet (40 mg total) by mouth 2 (two) times daily. 120 tablet 5  . levothyroxine (SYNTHROID) 88 MCG tablet Take 88 mcg by mouth daily.     Marland Kitchen losartan (COZAAR) 25 MG tablet Take 1 tablet (25 mg total) by mouth daily. 90 tablet 3  . methotrexate (RHEUMATREX) 2.5 MG tablet Take 12.5 mg by mouth every Wednesday.   3  . metoprolol succinate (TOPROL XL) 25 MG 24 hr tablet Take 1.5 tablets (37.25m) by mouth twice a day 270 tablet 3  . Multiple Vitamin (MULTIVITAMIN) tablet Take 1 tablet by mouth daily.       . mupirocin cream (BACTROBAN) 2 % Apply to affected area 3 times daily 30 g 0  . mupirocin ointment (BACTROBAN) 2 % SMARTSIG:1 Application Topical 2-3 Times Daily    . OVER THE COUNTER MEDICATION Place 1 drop into both eyes daily as needed (for dry eyes. Advanced eye relief).    .Marland Kitchen  phenylephrine-shark liver oil-mineral oil-petrolatum (PREPARATION H) 0.25-3-14-71.9 % rectal ointment Place 1 application rectally as needed for hemorrhoids.    . potassium chloride SA (KLOR-CON M20) 20 MEQ tablet Take 1 tablet (20 mEq total) by mouth 3 (three) times daily. 60 tablet 11  . predniSONE (DELTASONE) 5 MG tablet Take 5 mg by mouth daily with breakfast.    . rosuvastatin (CRESTOR) 5 MG tablet Take 5 mg by mouth every other day.     . sotalol (BETAPACE) 80 MG tablet Take 1 tablet (80 mg total) by mouth 2 (two) times daily. 180 tablet 3  . vitamin B-12 (CYANOCOBALAMIN) 1000 MCG tablet Take 1,000 mcg by mouth daily.     No current facility-administered medications for this encounter.    Allergies  Allergen Reactions  . Butoconazole Itching, Rash and Other (See Comments)    'Redness and swelling feverish ' 'worse symptoms '  . Keflex [Cephalexin] Diarrhea  . Lipitor [Atorvastatin Calcium] Other (See Comments)    'Muscle weakness'      Social History   Socioeconomic History  . Marital status: Married    Spouse name: Not on file  . Number of children: 2  . Years of education: Not on file  . Highest education level: Not on file  Occupational History    Employer: UNEMPLOYED  Tobacco Use  . Smoking status: Never Smoker  . Smokeless tobacco: Never Used  Substance and Sexual Activity  . Alcohol use: No  . Drug use: No  . Sexual activity: Not Currently    Birth control/protection: Post-menopausal  Other Topics Concern  . Not on file  Social History Narrative  . Not on file   Social Determinants of Health   Financial Resource Strain:   . Difficulty of Paying Living Expenses:   Food  Insecurity:   . Worried About Charity fundraiser in the Last Year:   . Arboriculturist in the Last Year:   Transportation Needs:   . Film/video editor (Medical):   Marland Kitchen Lack of Transportation (Non-Medical):   Physical Activity:   . Days of Exercise per Week:   . Minutes of Exercise per Session:   Stress:   . Feeling of Stress :   Social Connections:   . Frequency of Communication with Friends and Family:   . Frequency of Social Gatherings with Friends and Family:   . Attends Religious Services:   . Active Member of Clubs or Organizations:   . Attends Archivist Meetings:   Marland Kitchen Marital Status:   Intimate Partner Violence:   . Fear of Current or Ex-Partner:   . Emotionally Abused:   Marland Kitchen Physically Abused:   . Sexually Abused:      ROS- All systems are reviewed and negative except as per the HPI above.  Physical Exam: Vitals:   11/18/19 1325  BP: (!) 100/52  Pulse: 67  Weight: 93.8 kg  Height: _0  (1.422 m)    GEN- The patient is well appearing obese elderly female, alert and oriented x 3 today.   HEENT-head normocephalic, atraumatic, sclera clear, conjunctiva pink, hearing intact, trachea midline. Lungs- Clear to ausculation bilaterally, normal work of breathing Heart- Regular rate and rhythm, no murmurs, rubs or gallops  GI- soft, NT, ND, + BS Extremities- no clubbing, cyanosis. Large lower legs but soft, no pitting edema.left knee mildly pink. MS- no significant deformity or atrophy Skin- no rash or lesion Psych- euthymic mood, full affect Neuro- strength and sensation are intact  Wt Readings from Last 3 Encounters:  11/18/19 93.8 kg  09/23/19 94.6 kg  09/12/19 93.2 kg    EKG today demonstrates SR HR 67, with PAC's , PR 164, QRS 64, QTc 435  Echo 07/26/18 demonstrated  1. The left ventricle has normal systolic function with an ejection fraction of 60-65%. The cavity size was normal. There is mildly increased left ventricular wall thickness. Left  ventricular diastolic Doppler parameters are consistent with  pseudonormalization.  2. The right ventricle has normal systolic function. The cavity was normal. There is no increase in right ventricular wall thickness.  3. Left atrial size was severely dilated.  4. The mitral valve is normal in structure. There is mild thickening. There is moderate mitral annular calcification present.  5. The tricuspid valve is normal in structure.  6. The aortic valve is tricuspid There is mild thickening of the aortic valve.  7. The pulmonic valve was normal in structure.  8. Normal LV systolic function; mild LVH; moderate diastolic dysfunction; moderate LAE; mild MR and TR.  9. Right atrial pressure is estimated at 3 mmHg.  Epic records are reviewed at length today  Assessment and Plan:  1. Paroxysmal atrial fibrillation ILR shows low afib burden 0.4%   Continue sotalol 80 mg BID. QT stable.  Continue Eliquis 5 mg BID  This patients CHA2DS2-VASc Score and unadjusted Ischemic Stroke Rate (% per year) is equal to 4.8 % stroke rate/year from a score of 4  Above score calculated as 1 point each if present [CHF, HTN, DM, Vascular=MI/PAD/Aortic Plaque, Age if 65-74, or Female] Above score calculated as 2 points each if present [Age > 75, or Stroke/TIA/TE]  2. LLE  Acute on chronic  On 3rd day of increased lasix  Will go back to 40 am and 40 pm  Pt feels edema is back to baseline Bmet/mag today   3. Obesity Body mass index is 46.36 kg/m. Lifestyle modification was discussed and encouraged including regular physical activity and weight reduction.  4. Obstructive sleep apnea Patient is not on CPAP.  5. CAD No anginal symptoms.  6.  HTN Stable with losartan, no changes today. Recheck bmet as above.  7. Recent mechanical fall fx of left knee cap r/o in urgent care Has f/u with orthopaedist this week    Follow up in the AF clinic as scheduled   Butch Penny C. Shayne Deerman, [facility] [address] [place], [place] [address] [phone]

## 2019-11-20 ENCOUNTER — Other Ambulatory Visit (HOSPITAL_COMMUNITY): Payer: Self-pay | Admitting: *Deleted

## 2019-11-20 DIAGNOSIS — I10 Essential (primary) hypertension: Secondary | ICD-10-CM

## 2019-11-20 MED ORDER — LOSARTAN POTASSIUM 25 MG PO TABS: 25.0000 mg | ORAL_TABLET | Freq: Every day | ORAL | 2 refills | Status: DC

## 2019-11-22 DIAGNOSIS — S8002XA Contusion of left knee, initial encounter: Secondary | ICD-10-CM | POA: Diagnosis not present

## 2019-11-22 DIAGNOSIS — M170 Bilateral primary osteoarthritis of knee: Secondary | ICD-10-CM | POA: Diagnosis not present

## 2019-11-22 DIAGNOSIS — M25561 Pain in right knee: Secondary | ICD-10-CM | POA: Diagnosis not present

## 2019-11-22 DIAGNOSIS — M25562 Pain in left knee: Secondary | ICD-10-CM | POA: Diagnosis not present

## 2019-11-25 ENCOUNTER — Ambulatory Visit (INDEPENDENT_AMBULATORY_CARE_PROVIDER_SITE_OTHER): Payer: Medicare Other | Admitting: *Deleted

## 2019-11-25 DIAGNOSIS — I480 Paroxysmal atrial fibrillation: Secondary | ICD-10-CM | POA: Diagnosis not present

## 2019-11-25 DIAGNOSIS — H04123 Dry eye syndrome of bilateral lacrimal glands: Secondary | ICD-10-CM | POA: Diagnosis not present

## 2019-11-25 DIAGNOSIS — H53002 Unspecified amblyopia, left eye: Secondary | ICD-10-CM | POA: Diagnosis not present

## 2019-11-25 DIAGNOSIS — Z79899 Other long term (current) drug therapy: Secondary | ICD-10-CM | POA: Diagnosis not present

## 2019-11-25 DIAGNOSIS — H16223 Keratoconjunctivitis sicca, not specified as Sjogren's, bilateral: Secondary | ICD-10-CM | POA: Diagnosis not present

## 2019-11-26 LAB — CUP PACEART REMOTE DEVICE CHECK
Date Time Interrogation Session: 20210614001024
Implantable Pulse Generator Implant Date: 20200710

## 2019-11-26 NOTE — Progress Notes (Signed)
[facility] Summary Report / Loop Recorder 

## 2019-11-27 DIAGNOSIS — Z0000 Encounter for general adult medical examination without abnormal findings: Secondary | ICD-10-CM | POA: Diagnosis not present

## 2019-11-27 DIAGNOSIS — I10 Essential (primary) hypertension: Secondary | ICD-10-CM | POA: Diagnosis not present

## 2019-12-04 DIAGNOSIS — N183 Chronic kidney disease, stage 3 unspecified: Secondary | ICD-10-CM | POA: Diagnosis not present

## 2019-12-04 DIAGNOSIS — Z0000 Encounter for general adult medical examination without abnormal findings: Secondary | ICD-10-CM | POA: Diagnosis not present

## 2019-12-30 ENCOUNTER — Ambulatory Visit (INDEPENDENT_AMBULATORY_CARE_PROVIDER_SITE_OTHER): Payer: Medicare Other | Admitting: *Deleted

## 2019-12-30 DIAGNOSIS — I5032 Chronic diastolic (congestive) heart failure: Secondary | ICD-10-CM | POA: Diagnosis not present

## 2019-12-30 LAB — CUP PACEART REMOTE DEVICE CHECK
Date Time Interrogation Session: 20210718231212
Implantable Pulse Generator Implant Date: 20200710

## 2019-12-31 ENCOUNTER — Other Ambulatory Visit: Payer: Self-pay | Admitting: Internal Medicine

## 2020-01-01 NOTE — Progress Notes (Signed)
[facility] Summary Report / Loop Recorder 

## 2020-01-23 ENCOUNTER — Encounter (HOSPITAL_COMMUNITY): Payer: Self-pay | Admitting: Physician Assistant

## 2020-01-23 ENCOUNTER — Ambulatory Visit (HOSPITAL_COMMUNITY)
Admission: RE | Admit: 2020-01-23 | Discharge: 2020-01-23 | Disposition: A | Discharge status: Home / Self Care | Payer: Medicare Other | Source: Ambulatory Visit | Attending: Physician Assistant | Admitting: Physician Assistant

## 2020-01-23 ENCOUNTER — Other Ambulatory Visit: Payer: Self-pay

## 2020-01-23 VITALS — BP 130/70 | HR 59 | Ht <= 58 in | Wt 219.6 lb

## 2020-01-23 DIAGNOSIS — E039 Hypothyroidism, unspecified: Secondary | ICD-10-CM | POA: Insufficient documentation

## 2020-01-23 DIAGNOSIS — Z7901 Long term (current) use of anticoagulants: Secondary | ICD-10-CM | POA: Insufficient documentation

## 2020-01-23 DIAGNOSIS — Z79899 Other long term (current) drug therapy: Secondary | ICD-10-CM | POA: Diagnosis not present

## 2020-01-23 DIAGNOSIS — R609 Edema, unspecified: Secondary | ICD-10-CM | POA: Insufficient documentation

## 2020-01-23 DIAGNOSIS — Z90710 Acquired absence of both cervix and uterus: Secondary | ICD-10-CM | POA: Insufficient documentation

## 2020-01-23 DIAGNOSIS — Z8249 Family history of ischemic heart disease and other diseases of the circulatory system: Secondary | ICD-10-CM | POA: Insufficient documentation

## 2020-01-23 DIAGNOSIS — I2510 Atherosclerotic heart disease of native coronary artery without angina pectoris: Secondary | ICD-10-CM | POA: Diagnosis not present

## 2020-01-23 DIAGNOSIS — D6869 Other thrombophilia: Secondary | ICD-10-CM

## 2020-01-23 DIAGNOSIS — E669 Obesity, unspecified: Secondary | ICD-10-CM | POA: Insufficient documentation

## 2020-01-23 DIAGNOSIS — Z9119 Patient's noncompliance with other medical treatment and regimen: Secondary | ICD-10-CM | POA: Diagnosis not present

## 2020-01-23 DIAGNOSIS — G4733 Obstructive sleep apnea (adult) (pediatric): Secondary | ICD-10-CM | POA: Insufficient documentation

## 2020-01-23 DIAGNOSIS — Z6841 Body Mass Index (BMI) 40.0 and over, adult: Secondary | ICD-10-CM | POA: Insufficient documentation

## 2020-01-23 DIAGNOSIS — I110 Hypertensive heart disease with heart failure: Secondary | ICD-10-CM | POA: Insufficient documentation

## 2020-01-23 DIAGNOSIS — Z79890 Hormone replacement therapy (postmenopausal): Secondary | ICD-10-CM | POA: Insufficient documentation

## 2020-01-23 DIAGNOSIS — I480 Paroxysmal atrial fibrillation: Secondary | ICD-10-CM | POA: Diagnosis not present

## 2020-01-23 DIAGNOSIS — K219 Gastro-esophageal reflux disease without esophagitis: Secondary | ICD-10-CM | POA: Diagnosis not present

## 2020-01-23 DIAGNOSIS — Z7952 Long term (current) use of systemic steroids: Secondary | ICD-10-CM | POA: Diagnosis not present

## 2020-01-23 DIAGNOSIS — Z713 Dietary counseling and surveillance: Secondary | ICD-10-CM | POA: Insufficient documentation

## 2020-01-23 DIAGNOSIS — I5032 Chronic diastolic (congestive) heart failure: Secondary | ICD-10-CM | POA: Insufficient documentation

## 2020-01-23 DIAGNOSIS — E785 Hyperlipidemia, unspecified: Secondary | ICD-10-CM | POA: Insufficient documentation

## 2020-01-23 LAB — BASIC METABOLIC PANEL
Anion gap: 10 (ref 5–15)
BUN: 21 mg/dL (ref 8–23)
CO2: 28 mmol/L (ref 22–32)
Calcium: 9.4 mg/dL (ref 8.9–10.3)
Chloride: 105 mmol/L (ref 98–111)
Creatinine, Ser: 1.08 mg/dL — ABNORMAL HIGH (ref 0.44–1.00)
GFR calc Af Amer: 59 mL/min — ABNORMAL LOW (ref >60)
GFR calc non Af Amer: 51 mL/min — ABNORMAL LOW (ref >60)
Glucose, Bld: 89 mg/dL (ref 70–99)
Potassium: 4 mmol/L (ref 3.5–5.1)
Sodium: 143 mmol/L (ref 135–145)

## 2020-01-23 LAB — MAGNESIUM: Magnesium: 2 mg/dL (ref 1.7–2.4)

## 2020-01-23 NOTE — Patient Instructions (Signed)
Lasix 80mg  in the am and take 40mg  in the afternoon X 3 DAYS Take extra potassium in the am X 3 DAYS Dr. Bonita Quin office will contact you for appointment (2 month f/u appointment)

## 2020-01-23 NOTE — Progress Notes (Signed)
Primary Care Physician: [name], DO Primary Cardiologist: none Primary Electrophysiologist: Dr Rayann Heman Referring Physician: Dr Lorne Skeens is a 74 y.o. female with a history of CAD, chronic HFpEF, HTN, MAT, OSA intolerant of CPAP, and paroxysmal atrial fibrillation who presents for follow up in the [facility]. Patient reports that she is having increased lower extremity over the last 3 weeks. She also reports daytime sleepiness. Her ILR shows 0% afib burden. She denies bleeding issues on anticoagulation.   Today, she denies symptoms of palpitations, chest pain, shortness of breath, orthopnea, PND, dizziness, presyncope, syncope, bleeding, or neurologic sequela. The patient is tolerating medications without difficulties and is otherwise without complaint today.    Atrial Fibrillation Risk Factors:  she does have symptoms or diagnosis of sleep apnea. she is not compliant with CPAP therapy. she does not have a history of rheumatic fever. she does not have a history of alcohol use.   she has a BMI of Body mass index is 49.23 kg/m.Marland Kitchen Filed Weights   01/23/20 0948  Weight: 99.6 kg    Family History  Problem Relation Age of Onset  . Colon cancer Mother        mets, spot on lungs and spine  . Coronary artery disease Father   . Emphysema Father   . Heart attack Brother        x 2  . Colon cancer Paternal Uncle   . Kidney disease Brother   . Hypertension Brother   . Stroke Neg Hx   . Esophageal cancer Neg Hx      Atrial Fibrillation Management history:  Previous antiarrhythmic drugs: amiodarone (stopped 2/2 tremor), sotalol Previous cardioversions: none Previous ablations: none CHADS2VASC score: 4 Anticoagulation history: Eliquis   Past Medical History:  Diagnosis Date  . Anemia   . Bilateral leg edema   . C. difficile enteritis   . CAD (coronary artery disease)    a. LHC 03/2006: EF 65%, mid LAD 40-50%, ostial D1 70-80%,  normal circumflex, normal RCA;  b. Lexiscan Myoview 09/2010: No ischemia or scar, EF 75%;  c. Lex MV 5/14:  Normal, no ischemia, EF 71% // Myoview 08/2018: EF 43, normal perfusion; Intermediate Risk due to low EF (Echo 07/2018 with normal EF)   . Cataract    bilateral-had surgery  . Cellulitis of right leg    Hx - resolved  . Chronic diastolic CHF (congestive heart failure) (HCC)    Echo 8/14:  Mild LVH, EF 55-65%, normal wall motion, Tr AI, mild MR, mod TR, PASP 45 // 12/2014 Echo: EF 60-65%, nl wall motion, mildly dil RA/LA, PASP 74mHg.// Echo 07/2018: EF 60-65, mild LVH, mod diastolic dysfunction, normal RVSF, severe LAE, mod MAC, mild MR, mild TR   . Continuous urine leakage   . [place]atomyositis ([place]   . Deviated septum   . Essential hypertension   . Family history of adverse reaction to anesthesia    .' MY SON IS A DIFFICULT INTUBATION"  . Gall stones    a. 08/2014 Abd U/S:  Large gallstone measuring 4.2 cm.  .Marland KitchenGERD (gastroesophageal reflux disease)   . Gout   . Hiatal hernia   . History of pancreatitis   . History of PFTs    a. PFTs 10/2012: normal.   . Hyperlipidemia   . Hypothyroidism   . Internal hemorrhoids   . Morbid obesity ([place]   . [place]ifocal atrial tachycardia ([place] 08/10/2018   Noted during admission  in 07/2018  . Osteoarthritis    spine hips knees  . PAF (paroxysmal atrial fibrillation) (HCC)    a. CHA2DS2VASc = 5-->poor OAC candidate 2/2 falls. No problems now per patient 03/2016  . Pneumonia   . Seizures ([place])    as a baby  . Shingles    Hx  . Sleep apnea    Does not use CPAP  . Spinal cord compression ([place]) 02/21/2014   Past Surgical History:  Procedure Laterality Date  . CATARACT EXTRACTION, BILATERAL Bilateral   . COLONOSCOPY  03/2011  . Implantable loop recorder placement  12/21/2018   Medtronic Reveal [place] model [id number] (SN [id number]) implanted by Dr Rayann Heman in office for AF management  . NASAL SEPTUM SURGERY    . TOTAL ABDOMINAL HYSTERECTOMY    .  VERTEBROPLASTY  Sept 7 and May 30, 2014   x 2, T11/T12  . WISDOM TOOTH EXTRACTION      Current Outpatient Medications  Medication Sig Dispense Refill  . acetaminophen (TYLENOL) 500 MG tablet Take 500-1,000 mg by mouth every 6 (six) hours as needed for mild pain or fever.     Marland Kitchen alendronate (FOSAMAX) 70 MG tablet Take 1 tablet by mouth every Tuesday.     Marland Kitchen allopurinol (ZYLOPRIM) 100 MG tablet Take 100 mg by mouth 2 (two) times daily.     Marland Kitchen CALCIUM PO Take 500 mg by mouth daily.    . cetirizine (ZYRTEC) 10 MG tablet Take 10 mg by mouth daily.    . Cholecalciferol (VITAMIN D3) 2000 UNITS TABS Take 2,000 Units by mouth daily.     Marland Kitchen ELIQUIS 5 MG TABS tablet TAKE 1 TABLET BY MOUTH TWICE A DAY 60 tablet 5  . famotidine (PEPCID) 20 MG tablet Take 20 mg by mouth daily.    . folic acid (FOLVITE) 1 MG tablet Take 1 mg by mouth 4 (four) times daily.    . furosemide (LASIX) 40 MG tablet Take 1 tablet (40 mg total) by mouth 2 (two) times daily. 120 tablet 5  . levothyroxine (SYNTHROID) 88 MCG tablet Take 88 mcg by mouth daily.     Marland Kitchen losartan (COZAAR) 25 MG tablet Take 1 tablet (25 mg total) by mouth daily. 90 tablet 2  . methotrexate (RHEUMATREX) 2.5 MG tablet Take 12.5 mg by mouth every Wednesday.   3  . metoprolol succinate (TOPROL XL) 25 MG 24 hr tablet Take 1.5 tablets (37.28m) by mouth twice a day 270 tablet 3  . Multiple Vitamin (MULTIVITAMIN) tablet Take 1 tablet by mouth daily.      .Marland KitchenOVER THE COUNTER MEDICATION Place 1 drop into both eyes daily as needed (for dry eyes. Advanced eye relief).    . phenylephrine-shark liver oil-mineral oil-petrolatum (PREPARATION H) 0.25-3-14-71.9 % rectal ointment Place 1 application rectally as needed for hemorrhoids.    . potassium chloride SA (KLOR-CON M20) 20 MEQ tablet Take 1 tablet (20 mEq total) by mouth 3 (three) times daily. 60 tablet 11  . predniSONE (DELTASONE) 5 MG tablet Take 5 mg by mouth daily with breakfast.    . rosuvastatin (CRESTOR) 5 MG tablet  Take 5 mg by mouth every other day.     . sotalol (BETAPACE) 80 MG tablet TAKE 1 TABLET BY MOUTH  TWICE DAILY 180 tablet 3  . vitamin B-12 (CYANOCOBALAMIN) 1000 MCG tablet Take 1,000 mcg by mouth daily.     No current facility-administered medications for this encounter.    Allergies  Allergen Reactions  . Butoconazole Itching,  Rash and Other (See Comments)    'Redness and swelling feverish ' 'worse symptoms '  . Keflex [Cephalexin] Diarrhea  . Lipitor [Atorvastatin Calcium] Other (See Comments)    'Muscle weakness'      Social History   Socioeconomic History  . Marital status: Married    Spouse name: Not on file  . Number of children: 2  . Years of education: Not on file  . Highest education level: Not on file  Occupational History    Employer: UNEMPLOYED  Tobacco Use  . Smoking status: Never Smoker  . Smokeless tobacco: Never Used  Vaping Use  . Vaping Use: Never used  Substance and Sexual Activity  . Alcohol use: No  . Drug use: No  . Sexual activity: Not Currently    Birth control/protection: Post-menopausal  Other Topics Concern  . Not on file  Social History Narrative  . Not on file   Social Determinants of Health   Financial Resource Strain:   . Difficulty of Paying Living Expenses:   Food Insecurity:   . Worried About Charity fundraiser in the Last Year:   . Arboriculturist in the Last Year:   Transportation Needs:   . Film/video editor (Medical):   Marland Kitchen Lack of Transportation (Non-Medical):   Physical Activity:   . Days of Exercise per Week:   . Minutes of Exercise per Session:   Stress:   . Feeling of Stress :   Social Connections:   . Frequency of Communication with Friends and Family:   . Frequency of Social Gatherings with Friends and Family:   . Attends Religious Services:   . Active Member of Clubs or Organizations:   . Attends Archivist Meetings:   Marland Kitchen Marital Status:   Intimate Partner Violence:   . Fear of Current or  Ex-Partner:   . Emotionally Abused:   Marland Kitchen Physically Abused:   . Sexually Abused:      ROS- All systems are reviewed and negative except as per the HPI above.  Physical Exam: Vitals:   01/23/20 0948  BP: 130/70  Pulse: (!) 59  Weight: 99.6 kg  Height: _0  (1.422 m)    GEN- The patient is well appearing morbidly obese female, alert and oriented x 3 today.   HEENT-head normocephalic, atraumatic, sclera clear, conjunctiva pink, hearing intact, trachea midline. Lungs- Clear to ausculation bilaterally, normal work of breathing Heart- Regular rate and rhythm, no murmurs, rubs or gallops  GI- soft, NT, ND, + BS Extremities- no clubbing, cyanosis. 1+ bilateral edema MS- no significant deformity or atrophy Skin- no rash or lesion Psych- euthymic mood, full affect Neuro- strength and sensation are intact   Wt Readings from Last 3 Encounters:  01/23/20 99.6 kg  11/18/19 93.8 kg  09/23/19 94.6 kg    EKG today demonstrates SB HR 59, PAC, NST, PR 164, QRS 68, QTc 411  Echo 07/26/18 demonstrated  1. The left ventricle has normal systolic function with an ejection fraction of 60-65%. The cavity size was normal. There is mildly increased left ventricular wall thickness. Left ventricular diastolic Doppler parameters are consistent with  pseudonormalization.  2. The right ventricle has normal systolic function. The cavity was normal. There is no increase in right ventricular wall thickness.  3. Left atrial size was severely dilated.  4. The mitral valve is normal in structure. There is mild thickening. There is moderate mitral annular calcification present.  5. The tricuspid valve is normal in  structure.  6. The aortic valve is tricuspid There is mild thickening of the aortic valve.  7. The pulmonic valve was normal in structure.  8. Normal LV systolic function; mild LVH; moderate diastolic dysfunction; moderate LAE; mild MR and TR.  9. Right atrial pressure is estimated at 3  mmHg.  Epic records are reviewed at length today  Assessment and Plan:  1. Paroxysmal atrial fibrillation ILR shows low afib burden 0% with no new episodes. Continue sotalol 80 mg BID. QT stable. Check bmet/mag today. Continue Eliquis 5 mg BID  This patients CHA2DS2-VASc Score and unadjusted Ischemic Stroke Rate (% per year) is equal to 4.8 % stroke rate/year from a score of 4  Above score calculated as 1 point each if present [CHF, HTN, DM, Vascular=MI/PAD/Aortic Plaque, Age if 65-74, or Female] Above score calculated as 2 points each if present [Age > 75, or Stroke/TIA/TE]  2. LLE  She is having more edema over the last 3 weeks and her weight is up ~10 lbs.  Will increase Lasix to 80 mg AM and 40 mg PM for 3 days. Patient to take an extra dose of KCl in the AM. Bmet as above.  3. Obesity Body mass index is 49.23 kg/m. Lifestyle modification was discussed and encouraged including regular physical activity and weight reduction.  4. Obstructive sleep apnea Patient is not on CPAP therapy. Suspect this is contributing to her daytime somnolence. She is not interested in resuming CPAP.  5. CAD No anginal symptoms.  6.  HTN Stable, no changes today.   Follow up with Dr Rayann Heman in 2 months.    [facility] [address] [place], [place] [address] [phone]

## 2020-02-01 LAB — CUP PACEART REMOTE DEVICE CHECK
Date Time Interrogation Session: 20210819000937
Implantable Pulse Generator Implant Date: 20200710

## 2020-02-03 ENCOUNTER — Ambulatory Visit (INDEPENDENT_AMBULATORY_CARE_PROVIDER_SITE_OTHER): Payer: Medicare Other | Admitting: *Deleted

## 2020-02-03 DIAGNOSIS — I480 Paroxysmal atrial fibrillation: Secondary | ICD-10-CM | POA: Diagnosis not present

## 2020-02-04 DIAGNOSIS — M549 Dorsalgia, unspecified: Secondary | ICD-10-CM | POA: Diagnosis not present

## 2020-02-04 DIAGNOSIS — M810 Age-related osteoporosis without current pathological fracture: Secondary | ICD-10-CM | POA: Diagnosis not present

## 2020-02-04 DIAGNOSIS — Z79899 Other long term (current) drug therapy: Secondary | ICD-10-CM | POA: Diagnosis not present

## 2020-02-04 DIAGNOSIS — M150 Primary generalized (osteo)arthritis: Secondary | ICD-10-CM | POA: Diagnosis not present

## 2020-02-04 DIAGNOSIS — M3390 Dermatopolymyositis, unspecified, organ involvement unspecified: Secondary | ICD-10-CM | POA: Diagnosis not present

## 2020-02-10 NOTE — Progress Notes (Signed)
[facility] Summary Report / Loop Recorder 

## 2020-02-11 ENCOUNTER — Other Ambulatory Visit: Payer: Self-pay | Admitting: Internal Medicine

## 2020-02-22 ENCOUNTER — Other Ambulatory Visit: Payer: Self-pay | Admitting: Internal Medicine

## 2020-02-24 NOTE — Telephone Encounter (Signed)
Pt last saw Clint Fenton, PA on 01/23/20, last labs 01/23/20 Creat 1.08, age 74, weight 99.6kg, based on specified criteria pt is on appropriate dosage of Eliquis 5mg  BID.  Will refill rx.

## 2020-03-08 LAB — CUP PACEART REMOTE DEVICE CHECK
Date Time Interrogation Session: 20210919002948
Implantable Pulse Generator Implant Date: 20200710

## 2020-03-09 ENCOUNTER — Ambulatory Visit (INDEPENDENT_AMBULATORY_CARE_PROVIDER_SITE_OTHER): Payer: Medicare Other | Admitting: Emergency Medicine

## 2020-03-09 DIAGNOSIS — I480 Paroxysmal atrial fibrillation: Secondary | ICD-10-CM | POA: Diagnosis not present

## 2020-03-12 NOTE — Progress Notes (Signed)
[facility] Summary Report / Loop Recorder 

## 2020-03-17 DIAGNOSIS — H903 Sensorineural hearing loss, bilateral: Secondary | ICD-10-CM | POA: Diagnosis not present

## 2020-03-24 ENCOUNTER — Telehealth: Payer: Self-pay

## 2020-03-24 NOTE — Telephone Encounter (Signed)
Patient called inquiring about if she needed to reschedule appointment due to headache and pink ring outside of flu shot vaccine.  Advised to call back if symptoms worsen or covid symptoms.

## 2020-03-27 ENCOUNTER — Ambulatory Visit: Payer: Medicare Other | Admitting: Internal Medicine

## 2020-03-27 ENCOUNTER — Other Ambulatory Visit: Payer: Self-pay

## 2020-03-27 ENCOUNTER — Encounter: Payer: Self-pay | Admitting: Internal Medicine

## 2020-03-27 VITALS — BP 98/60 | HR 69 | Ht <= 58 in | Wt 216.0 lb

## 2020-03-27 DIAGNOSIS — R002 Palpitations: Secondary | ICD-10-CM | POA: Diagnosis not present

## 2020-03-27 DIAGNOSIS — G4733 Obstructive sleep apnea (adult) (pediatric): Secondary | ICD-10-CM

## 2020-03-27 DIAGNOSIS — I480 Paroxysmal atrial fibrillation: Secondary | ICD-10-CM | POA: Diagnosis not present

## 2020-03-27 DIAGNOSIS — I2510 Atherosclerotic heart disease of native coronary artery without angina pectoris: Secondary | ICD-10-CM | POA: Diagnosis not present

## 2020-03-27 DIAGNOSIS — I10 Essential (primary) hypertension: Secondary | ICD-10-CM

## 2020-03-27 NOTE — Progress Notes (Signed)
PCP: [name], DO   Primary EP: Dr Rayann Heman  [name] is a 74 y.o. female who presents today for routine electrophysiology followup.  Since last being seen in our clinic, the patient reports doing very well.  afib is well controlled.  She is not very active.  + edema chronically.  Today, she denies symptoms of palpitations, chest pain, shortness of breath,  dizziness, presyncope, or syncope.  The patient is otherwise without complaint today.   Past Medical History:  Diagnosis Date  . Anemia   . Bilateral leg edema   . C. difficile enteritis   . CAD (coronary artery disease)    a. LHC 03/2006: EF 65%, mid LAD 40-50%, ostial D1 70-80%, normal circumflex, normal RCA;  b. Lexiscan Myoview 09/2010: No ischemia or scar, EF 75%;  c. Lex MV 5/14:  Normal, no ischemia, EF 71% // Myoview 08/2018: EF 43, normal perfusion; Intermediate Risk due to low EF (Echo 07/2018 with normal EF)   . Cataract    bilateral-had surgery  . Cellulitis of right leg    Hx - resolved  . Chronic diastolic CHF (congestive heart failure) (HCC)    Echo 8/14:  Mild LVH, EF 55-65%, normal wall motion, Tr AI, mild MR, mod TR, PASP 45 // 12/2014 Echo: EF 60-65%, nl wall motion, mildly dil RA/LA, PASP 73mHg.// Echo 07/2018: EF 60-65, mild LVH, mod diastolic dysfunction, normal RVSF, severe LAE, mod MAC, mild MR, mild TR   . Continuous urine leakage   . D[place]atomyositis ([place]   . Deviated septum   . Essential hypertension   . Family history of adverse reaction to anesthesia    .' MY SON IS A DIFFICULT INTUBATION"  . Gall stones    a. 08/2014 Abd U/S:  Large gallstone measuring 4.2 cm.  .Marland KitchenGERD (gastroesophageal reflux disease)   . Gout   . Hiatal hernia   . History of pancreatitis   . History of PFTs    a. PFTs 10/2012: normal.   . Hyperlipidemia   . Hypothyroidism   . Internal hemorrhoids   . Morbid obesity (HF[place]   . Multifocal atrial tachycardia (HP[place] 08/10/2018   Noted during admission in 07/2018  . Osteoarthritis     spine hips knees  . PAF (paroxysmal atrial fibrillation) (HCC)    a. CHA2DS2VASc = 5-->poor OAC candidate 2/2 falls. No problems now per patient 03/2016  . Pneumonia   . Seizures (HW[place]    as a baby  . Shingles    Hx  . Sleep apnea    Does not use CPAP  . Spinal cord compression ([place] 02/21/2014   Past Surgical History:  Procedure Laterality Date  . CATARACT EXTRACTION, BILATERAL Bilateral   . COLONOSCOPY  03/2011  . Implantable loop recorder placement  12/21/2018   Medtronic Reveal [place] [id number](SN [id number]) implanted by Dr ARayann Hemanin office for AF management  . NASAL SEPTUM SURGERY    . TOTAL ABDOMINAL HYSTERECTOMY    . VERTEBROPLASTY  Sept 7 and May 30, 2014   x 2, T11/T12  . WISDOM TOOTH EXTRACTION      ROS- all systems are reviewed and negatives except as per HPI above  Current Outpatient Medications  Medication Sig Dispense Refill  . acetaminophen (TYLENOL) 500 MG tablet Take 500-1,000 mg by mouth every 6 (six) hours as needed for mild pain or fever.     .Marland Kitchenalendronate (FOSAMAX) 70 MG tablet Take 1 tablet by mouth every Tuesday.     .Marland Kitchen  allopurinol (ZYLOPRIM) 100 MG tablet Take 100 mg by mouth 2 (two) times daily.     Marland Kitchen CALCIUM PO Take 500 mg by mouth daily.    . cetirizine (ZYRTEC) 10 MG tablet Take 10 mg by mouth daily.    . Cholecalciferol (VITAMIN D3) 2000 UNITS TABS Take 2,000 Units by mouth daily.     Marland Kitchen ELIQUIS 5 MG TABS tablet TAKE 1 TABLET BY MOUTH TWICE A DAY 60 tablet 5  . famotidine (PEPCID) 20 MG tablet Take 20 mg by mouth daily.    . folic acid (FOLVITE) 1 MG tablet Take 1 mg by mouth 4 (four) times daily.    . furosemide (LASIX) 40 MG tablet Take 1 tablet (40 mg total) by mouth 2 (two) times daily. 120 tablet 5  . levothyroxine (SYNTHROID) 88 MCG tablet Take 88 mcg by mouth daily.     . methotrexate (RHEUMATREX) 2.5 MG tablet Take 12.5 mg by mouth every Wednesday.   3  . metoprolol succinate (TOPROL-XL) 25 MG 24 hr tablet TAKE 1 AND 1/2 TABLETS BY   MOUTH TWICE DAILY 270 tablet 2  . Multiple Vitamin (MULTIVITAMIN) tablet Take 1 tablet by mouth daily.      Marland Kitchen OVER THE COUNTER MEDICATION Place 1 drop into both eyes daily as needed (for dry eyes. Advanced eye relief).    . phenylephrine-shark liver oil-mineral oil-petrolatum (PREPARATION H) 0.25-3-14-71.9 % rectal ointment Place 1 application rectally as needed for hemorrhoids.    . potassium chloride SA (KLOR-CON M20) 20 MEQ tablet Take 1 tablet (20 mEq total) by mouth 3 (three) times daily. 60 tablet 11  . predniSONE (DELTASONE) 5 MG tablet Take 5 mg by mouth daily with breakfast.    . rosuvastatin (CRESTOR) 5 MG tablet Take 5 mg by mouth every other day.     . sotalol (BETAPACE) 80 MG tablet TAKE 1 TABLET BY MOUTH  TWICE DAILY 180 tablet 3  . vitamin B-12 (CYANOCOBALAMIN) 1000 MCG tablet Take 1,000 mcg by mouth daily.    Marland Kitchen losartan (COZAAR) 25 MG tablet Take 1 tablet (25 mg total) by mouth daily. 90 tablet 2   No current facility-administered medications for this visit.    Physical Exam: Vitals:   03/27/20 1512  BP: 98/60  Pulse: 69  SpO2: 96%  Weight: 216 lb (98 kg)  Height: _0  (1.422 m)    GEN- The patient is obese appearing, alert and oriented x 3 today.   Head- normocephalic, atraumatic Eyes-  Sclera clear, conjunctiva pink Ears- hearing intact Oropharynx- clear Lungs-   normal work of breathing Heart- Regular rate and rhythm  GI- soft  Extremities- no clubbing, cyanosis, +edema  Wt Readings from Last 3 Encounters:  03/27/20 216 lb (98 kg)  01/23/20 219 lb 9.6 oz (99.6 kg)  11/18/19 206 lb 12.8 oz (93.8 kg)    EKG tracing ordered today is personally reviewed and shows sinus rhythm, QTc 452 msec  Assessment and Plan:  1. afib well controlled with sotalol.  Qt ist stable Labs reviewed from 01/2020 Continue eliquis for chads2vasc score of 4 ILR is reviewed today We will need to follow her closely to avoid toxicity with sotalol  2. Morbid obesity Body mass  index is 48.43 kg/m. Lifestyle modification is advised  3. OSA Does not wear CPAP previously referred to Dr Ron Parker  4. HTN Stable No change required today  5. CAD No ischemic symptoms No changes  Return to see EP PA every 6 months  Risks,  benefits and potential toxicities for medications prescribed and/or refilled reviewed with patient today.   [name] MD, [facility] 03/27/2020 3:19 PM

## 2020-03-27 NOTE — Patient Instructions (Addendum)
Medication Instructions:  Your physician recommends that you continue on your current medications as directed. Please refer to the Current Medication list given to you today.  *If you need a refill on your cardiac medications before your next appointment, please call your pharmacy*  Lab Work: None ordered.  If you have labs (blood work) drawn today and your tests are completely normal, you will receive your results only by: Marland Kitchen MyChart Message (if you have MyChart) OR . A paper copy in the mail If you have any lab test that is abnormal or we need to change your treatment, we will call you to review the results.  Testing/Procedures: None ordered.  Follow-Up: At [facility], you and your health needs are our priority.  As part of our continuing mission to provide you with exceptional heart care, we have created designated Provider Care Teams.  These Care Teams include your primary Cardiologist (physician) and Advanced Practice Providers (APPs -  Physician Assistants and Nurse Practitioners) who all work together to provide you with the care you need, when you need it.  We recommend signing up for the patient portal called "MyChart".  Sign up information is provided on this After Visit Summary.  MyChart is used to connect with patients for Virtual Visits (Telemedicine).  Patients are able to view lab/test results, encounter notes, upcoming appointments, etc.  Non-urgent messages can be sent to your provider as well.   To learn more about what you can do with MyChart, go to [email or web address].    Your next appointment:   Your physician wants you to follow-up in: 6 month with Tommye Standard.  You will receive a reminder letter in the mail two months in advance. If you don't receive a letter, please call our office to schedule the follow-up appointment.   Other Instructions:

## 2020-04-01 ENCOUNTER — Ambulatory Visit (INDEPENDENT_AMBULATORY_CARE_PROVIDER_SITE_OTHER): Payer: Medicare Other

## 2020-04-01 DIAGNOSIS — I480 Paroxysmal atrial fibrillation: Secondary | ICD-10-CM

## 2020-04-01 DIAGNOSIS — H905 Unspecified sensorineural hearing loss: Secondary | ICD-10-CM | POA: Diagnosis not present

## 2020-04-02 DIAGNOSIS — Z1231 Encounter for screening mammogram for malignant neoplasm of breast: Secondary | ICD-10-CM | POA: Diagnosis not present

## 2020-04-04 LAB — CUP PACEART REMOTE DEVICE CHECK
Date Time Interrogation Session: 20211020021248
Implantable Pulse Generator Implant Date: 20200710

## 2020-04-07 NOTE — Progress Notes (Signed)
[facility] Summary Report / Loop Recorder 

## 2020-04-09 DIAGNOSIS — N390 Urinary tract infection, site not specified: Secondary | ICD-10-CM | POA: Diagnosis not present

## 2020-04-09 DIAGNOSIS — R300 Dysuria: Secondary | ICD-10-CM | POA: Diagnosis not present

## 2020-05-04 ENCOUNTER — Ambulatory Visit (INDEPENDENT_AMBULATORY_CARE_PROVIDER_SITE_OTHER): Payer: Medicare Other

## 2020-05-04 DIAGNOSIS — I480 Paroxysmal atrial fibrillation: Secondary | ICD-10-CM | POA: Diagnosis not present

## 2020-05-04 LAB — CUP PACEART REMOTE DEVICE CHECK
Date Time Interrogation Session: 20211120013226
Implantable Pulse Generator Implant Date: 20200710

## 2020-05-08 DIAGNOSIS — Z20822 Contact with and (suspected) exposure to covid-19: Secondary | ICD-10-CM | POA: Diagnosis not present

## 2020-05-11 ENCOUNTER — Ambulatory Visit
Admission: EM | Admit: 2020-05-11 | Discharge: 2020-05-11 | Disposition: A | Discharge status: Home / Self Care | Payer: Medicare Other | Attending: Family Medicine | Admitting: Family Medicine

## 2020-05-11 ENCOUNTER — Other Ambulatory Visit: Payer: Self-pay

## 2020-05-11 ENCOUNTER — Encounter: Payer: Self-pay | Admitting: Family Medicine

## 2020-05-11 DIAGNOSIS — J209 Acute bronchitis, unspecified: Secondary | ICD-10-CM | POA: Diagnosis not present

## 2020-05-11 NOTE — Discharge Instructions (Addendum)
Take one tsp of cough syrup twice daily as needed.

## 2020-05-11 NOTE — Progress Notes (Signed)
[facility] Summary Report / Loop Recorder 

## 2020-05-11 NOTE — ED Provider Notes (Signed)
[facility]    CSN: [id number] Arrival date & time: 05/11/20  1521      History   Chief Complaint Chief Complaint  Patient presents with  . Cough    HPI [name] is a 74 y.o. female.   This is an established urgent care patient presenting with Covid type symptoms.  Patient is on immunosuppressants.  Covid tests were negative.  Patient develops coughing after swallowing water.  Has had reflux symptoms.  This an ongoing set of symptoms.  She's been coughing all night, with stress incontinence.  No shortness of breath or fever.     Past Medical History:  Diagnosis Date  . Anemia   . Bilateral leg edema   . C. difficile enteritis   . CAD (coronary artery disease)    a. LHC 03/2006: EF 65%, mid LAD 40-50%, ostial D1 70-80%, normal circumflex, normal RCA;  b. Lexiscan Myoview 09/2010: No ischemia or scar, EF 75%;  c. Lex MV 5/14:  Normal, no ischemia, EF 71% // Myoview 08/2018: EF 43, normal perfusion; Intermediate Risk due to low EF (Echo 07/2018 with normal EF)   . Cataract    bilateral-had surgery  . Cellulitis of right leg    Hx - resolved  . Chronic diastolic CHF (congestive heart failure) (HCC)    Echo 8/14:  Mild LVH, EF 55-65%, normal wall motion, Tr AI, mild MR, mod TR, PASP 45 // 12/2014 Echo: EF 60-65%, nl wall motion, mildly dil RA/LA, PASP 74mHg.// Echo 07/2018: EF 60-65, mild LVH, mod diastolic dysfunction, normal RVSF, severe LAE, mod MAC, mild MR, mild TR   . Continuous urine leakage   . Dermatomyositis (HH[place]   . Deviated septum   . Essential hypertension   . Family history of adverse reaction to anesthesia    .' MY SON IS A DIFFICULT INTUBATION"  . Gall stones    a. 08/2014 Abd U/S:  Large gallstone measuring 4.2 cm.  .Marland KitchenGERD (gastroesophageal reflux disease)   . Gout   . Hiatal hernia   . History of pancreatitis   . History of PFTs    a. PFTs 10/2012: normal.   . Hyperlipidemia   . Hypothyroidism   . Internal hemorrhoids   . Morbid  obesity (HMont[place]   . Multifocal atrial tachycardia (HL[place] 08/10/2018   Noted during admission in 07/2018  . Osteoarthritis    spine hips knees  . PAF (paroxysmal atrial fibrillation) (HCC)    a. CHA2DS2VASc = 5-->poor OAC candidate 2/2 falls. No problems now per patient 03/2016  . Pneumonia   . Seizures (HS[place]    as a baby  . Shingles    Hx  . Sleep apnea    Does not use CPAP  . Spinal cord compression (HE[place] 02/21/2014    Patient Active Problem List   Diagnosis Date Noted  . Secondary hypercoagulable state (HSus[place] 06/24/2019  . Multifocal atrial tachycardia (HEek 08/10/2018  . Chest pain   . Cellulitis, bilateral LE but R>L, abd wall  08/13/2015  . Sepsis due to cellulitis ([place] 08/13/2015  . Acute kidney injury (H[place] 08/13/2015  . Hypokalemia 08/13/2015  . History of iron deficiency anemia 08/13/2015  . Thrombocytopenia (HCl[place] 08/13/2015  . Sepsis (H[place] 08/13/2015  . Chronic diastolic CHF (congestive heart failure) (HA[place]   . Paroxysmal atrial fibrillation (HCC)   . Essential hypertension   . Dermatomyositis (HShel[place] 02/18/2014  . Morbid obesity (HSe[place] 12/09/2013  . CAD (coronary artery disease) 09/25/2008  . LEG EDEMA,  BILATERAL 09/20/2008    Past Surgical History:  Procedure Laterality Date  . CATARACT EXTRACTION, BILATERAL Bilateral   . COLONOSCOPY  03/2011  . Implantable loop recorder placement  12/21/2018   Medtronic Reveal [place] model [id number] (SN [id number]) implanted by Dr Rayann Heman in office for AF management  . NASAL SEPTUM SURGERY    . TOTAL ABDOMINAL HYSTERECTOMY    . VERTEBROPLASTY  Sept 7 and May 30, 2014   x 2, T11/T12  . WISDOM TOOTH EXTRACTION      OB History   No obstetric history on file.      Home Medications    Prior to Admission medications   Medication Sig Start Date End Date Taking? Authorizing Provider  acetaminophen (TYLENOL) 500 MG tablet Take 500-1,000 mg by mouth every 6 (six) hours as needed for mild pain or fever.     [provider]   alendronate (FOSAMAX) 70 MG tablet Take 1 tablet by mouth every Tuesday.  04/01/15   [provider]  allopurinol (ZYLOPRIM) 100 MG tablet Take 100 mg by mouth 2 (two) times daily.  01/14/16   [provider]  CALCIUM PO Take 500 mg by mouth daily.    [provider]  cetirizine (ZYRTEC) 10 MG tablet Take 10 mg by mouth daily.    [provider]  Cholecalciferol (VITAMIN D3) 2000 UNITS TABS Take 2,000 Units by mouth daily.     [provider]  ELIQUIS 5 MG TABS tablet TAKE 1 TABLET BY MOUTH TWICE A DAY 02/24/20   Allred, Jeneen Rinks, MD  famotidine (PEPCID) 20 MG tablet Take 20 mg by mouth daily.    [provider]  folic acid (FOLVITE) 1 MG tablet Take 1 mg by mouth 4 (four) times daily.    [provider]  furosemide (LASIX) 40 MG tablet Take 1 tablet (40 mg total) by mouth 2 (two) times daily. 08/18/15   [name], [name], MD  levothyroxine (SYNTHROID) 88 MCG tablet Take 88 mcg by mouth daily.  07/07/17   [provider]  losartan (COZAAR) 25 MG tablet Take 1 tablet (25 mg total) by mouth daily. 11/20/19 02/18/20  Fenton, Clint R, PA  methotrexate (RHEUMATREX) 2.5 MG tablet Take 12.5 mg by mouth every Wednesday.  09/17/14   [provider]  metoprolol succinate (TOPROL-XL) 25 MG 24 hr tablet TAKE 1 AND 1/2 TABLETS BY  MOUTH TWICE DAILY 02/11/20   Allred, Jeneen Rinks, MD  Multiple Vitamin (MULTIVITAMIN) tablet Take 1 tablet by mouth daily.      [provider]  OVER THE COUNTER MEDICATION Place 1 drop into both eyes daily as needed (for dry eyes. Advanced eye relief).    [provider]  phenylephrine-shark liver oil-mineral oil-petrolatum (PREPARATION H) 0.25-3-14-71.9 % rectal ointment Place 1 application rectally as needed for hemorrhoids.    [provider]  potassium chloride SA (KLOR-CON M20) 20 MEQ tablet Take 1 tablet (20 mEq total) by mouth 3 (three) times daily. 08/18/15   [name], [name], MD   predniSONE (DELTASONE) 5 MG tablet Take 5 mg by mouth daily with breakfast.    [provider]  rosuvastatin (CRESTOR) 5 MG tablet Take 5 mg by mouth every other day.     [provider]  sotalol (BETAPACE) 80 MG tablet TAKE 1 TABLET BY MOUTH  TWICE DAILY 12/31/19   Allred, Jeneen Rinks, MD  vitamin B-12 (CYANOCOBALAMIN) 1000 MCG tablet Take 1,000 mcg by mouth daily.    [provider]  Family History Family History  Problem Relation Age of Onset  . Colon cancer Mother        mets, spot on lungs and spine  . Coronary artery disease Father   . Emphysema Father   . Heart attack Brother        x 2  . Colon cancer Paternal Uncle   . Kidney disease Brother   . Hypertension Brother   . Stroke Neg Hx   . Esophageal cancer Neg Hx     Social History Social History   Tobacco Use  . Smoking status: Never Smoker  . Smokeless tobacco: Never Used  Vaping Use  . Vaping Use: Never used  Substance Use Topics  . Alcohol use: No  . Drug use: No     Allergies   Butoconazole, Keflex [cephalexin], and Lipitor [atorvastatin calcium]   Review of Systems Review of Systems   Physical Exam Triage Vital Signs ED Triage Vitals  Enc Vitals Group     BP      Pulse      Resp      Temp      Temp src      SpO2      Weight      Height      Head Circumference      Peak Flow      Pain Score      Pain Loc      Pain Edu?      Excl. in [place]?    No data found.  Updated Vital Signs BP (!) 174/82 (BP Location: Left Arm)   Pulse 74   Temp 98.5 F (36.9 C) (Oral)   Resp 18   SpO2 94%    Physical Exam Vitals and nursing note reviewed.  Constitutional:      General: She is not in acute distress.    Appearance: Normal appearance. She is obese.  HENT:     Head: Normocephalic.     Nose: Nose normal.     Mouth/Throat:     Pharynx: Oropharynx is clear.  Eyes:     Conjunctiva/sclera: Conjunctivae normal.  Cardiovascular:     Rate and Rhythm: Normal rate. Rhythm  irregular.  Pulmonary:     Effort: Pulmonary effort is normal.     Breath sounds: Normal breath sounds.  Musculoskeletal:        General: Normal range of motion.     Cervical back: Normal range of motion and neck supple.  Skin:    General: Skin is warm and dry.  Neurological:     General: No focal deficit present.     Mental Status: She is alert and oriented to person, place, and time.  Psychiatric:        Mood and Affect: Mood normal.        Thought Content: Thought content normal.        Judgment: Judgment normal.      UC Treatments / Results  Labs (all labs ordered are listed, but only abnormal results are displayed) Labs Reviewed - No data to display  EKG   Radiology No results found.  Procedures Procedures (including critical care time)  Medications Ordered in UC Medications - No data to display  Initial Impression / Assessment and Plan / UC Course  I have reviewed the triage vital signs and the nursing notes.  Pertinent labs & imaging results that were available during my care of the patient were reviewed by me and considered in my  medical decision making (see chart for details).     Final Clinical Impressions(s) / UC Diagnoses   Final diagnoses:  Acute bronchitis, unspecified organism     Discharge Instructions     Take one tsp of cough syrup twice daily as needed.    ED Prescriptions    None     I have reviewed the PDMP during this encounter.   Robyn Haber, MD 05/11/20 1606

## 2020-05-11 NOTE — ED Triage Notes (Signed)
Pt here for cough increased over last several days; pt sts coughing when swallowing her pills and having difficulty with the liquids "going down the wrong way"

## 2020-05-14 ENCOUNTER — Ambulatory Visit (INDEPENDENT_AMBULATORY_CARE_PROVIDER_SITE_OTHER): Payer: Medicare Other

## 2020-05-14 ENCOUNTER — Ambulatory Visit
Admission: EM | Admit: 2020-05-14 | Discharge: 2020-05-14 | Disposition: A | Discharge status: Home / Self Care | Payer: Medicare Other | Attending: Emergency Medicine | Admitting: Emergency Medicine

## 2020-05-14 DIAGNOSIS — R21 Rash and other nonspecific skin eruption: Secondary | ICD-10-CM | POA: Diagnosis not present

## 2020-05-14 DIAGNOSIS — R059 Cough, unspecified: Secondary | ICD-10-CM

## 2020-05-14 DIAGNOSIS — R0602 Shortness of breath: Secondary | ICD-10-CM

## 2020-05-14 DIAGNOSIS — J22 Unspecified acute lower respiratory infection: Secondary | ICD-10-CM

## 2020-05-14 MED ORDER — ALBUTEROL SULFATE HFA 108 (90 BASE) MCG/ACT IN AERS: 1.0000 | INHALATION_SPRAY | Freq: Four times a day (QID) | RESPIRATORY_TRACT | 0 refills | Status: DC | PRN

## 2020-05-14 MED ORDER — PREDNISONE 10 MG PO TABS: ORAL_TABLET | ORAL | 0 refills | Status: DC

## 2020-05-14 MED ORDER — DM-GUAIFENESIN ER 30-600 MG PO TB12: 1.0000 | ORAL_TABLET | Freq: Two times a day (BID) | ORAL | 0 refills | Status: DC

## 2020-05-14 MED ORDER — DOXYCYCLINE HYCLATE 100 MG PO CAPS: 100.0000 mg | ORAL_CAPSULE | Freq: Two times a day (BID) | ORAL | 0 refills | Status: DC

## 2020-05-14 MED ORDER — DEXAMETHASONE 10 MG/ML FOR PEDIATRIC ORAL USE
10.0000 mg | Freq: Once | INTRAMUSCULAR | Status: AC
  Administered 2020-05-14: 10 mg via ORAL

## 2020-05-14 MED ORDER — BENZONATATE 200 MG PO CAPS: 200.0000 mg | ORAL_CAPSULE | Freq: Three times a day (TID) | ORAL | 0 refills | Status: AC | PRN

## 2020-05-14 NOTE — ED Provider Notes (Signed)
[facility]    CSN: [id number] Arrival date & time: 05/14/20  1556      History   Chief Complaint Chief Complaint  Patient presents with  . Cough    HPI [name] is a 74 y.o. female history of CAD, CHF, hypertension, paroxysmal A. fib, osteoporosis, presenting today for evaluation of cough and shortness of breath.  Patient reports over the past 1 to 2 weeks she has had cough and congestion.  Previously screened for Covid which was negative.  Seen here 2 to 3 days ago and treated for bronchitis with Tussionex.  Reports worsening wheezing since along with shortness of breath.  Reports a lot of discomfort especially in her right lower back/rib cage with coughing.  Does report leg swelling, but denies any significantly worsening swelling of recently.  Husband here with similar symptoms.    HPI  Past Medical History:  Diagnosis Date  . Anemia   . Bilateral leg edema   . C. difficile enteritis   . CAD (coronary artery disease)    a. LHC 03/2006: EF 65%, mid LAD 40-50%, ostial D1 70-80%, normal circumflex, normal RCA;  b. Lexiscan Myoview 09/2010: No ischemia or scar, EF 75%;  c. Lex MV 5/14:  Normal, no ischemia, EF 71% // Myoview 08/2018: EF 43, normal perfusion; Intermediate Risk due to low EF (Echo 07/2018 with normal EF)   . Cataract    bilateral-had surgery  . Cellulitis of right leg    Hx - resolved  . Chronic diastolic CHF (congestive heart failure) (HCC)    Echo 8/14:  Mild LVH, EF 55-65%, normal wall motion, Tr AI, mild MR, mod TR, PASP 45 // 12/2014 Echo: EF 60-65%, nl wall motion, mildly dil RA/LA, PASP 42mHg.// Echo 07/2018: EF 60-65, mild LVH, mod diastolic dysfunction, normal RVSF, severe LAE, mod MAC, mild MR, mild TR   . Continuous urine leakage   . Dermatomyositis ([place]   . Deviated septum   . Essential hypertension   . Family history of adverse reaction to anesthesia    .' MY SON IS A DIFFICULT INTUBATION"  . Gall stones    a. 08/2014 Abd U/S:  Large  gallstone measuring 4.2 cm.  .Marland KitchenGERD (gastroesophageal reflux disease)   . Gout   . Hiatal hernia   . History of pancreatitis   . History of PFTs    a. PFTs 10/2012: normal.   . Hyperlipidemia   . Hypothyroidism   . Internal hemorrhoids   . Morbid obesity ([place]   . Multifocal atrial tachycardia ([place] 08/10/2018   Noted during admission in 07/2018  . Osteoarthritis    spine hips knees  . PAF (paroxysmal atrial fibrillation) (HCC)    a. CHA2DS2VASc = 5-->poor OAC candidate 2/2 falls. No problems now per patient 03/2016  . Pneumonia   . Seizures ([place]    as a baby  . Shingles    Hx  . Sleep apnea    Does not use CPAP  . Spinal cord compression ([place] 02/21/2014    Patient Active Problem List   Diagnosis Date Noted  . Secondary hypercoagulable state (HH[place] 06/24/2019  . Multifocal atrial tachycardia (HCl[place] 08/10/2018  . Chest pain   . Cellulitis, bilateral LE but R>L, abd wall  08/13/2015  . Sepsis due to cellulitis (HC[place] 08/13/2015  . Acute kidney injury (HCle[place] 08/13/2015  . Hypokalemia 08/13/2015  . History of iron deficiency anemia 08/13/2015  . Thrombocytopenia (H[place] 08/13/2015  . Sepsis (HD[place] 08/13/2015  .  Chronic diastolic CHF (congestive heart failure) ([place])   . Paroxysmal atrial fibrillation (HCC)   . Essential hypertension   . Dermatomyositis ([place]) 02/18/2014  . Morbid obesity ([place]) 12/09/2013  . CAD (coronary artery disease) 09/25/2008  . LEG EDEMA, BILATERAL 09/20/2008    Past Surgical History:  Procedure Laterality Date  . CATARACT EXTRACTION, BILATERAL Bilateral   . COLONOSCOPY  03/2011  . Implantable loop recorder placement  12/21/2018   Medtronic Reveal [place] model [id number] (SN [id number]) implanted by Dr Rayann Heman in office for AF management  . NASAL SEPTUM SURGERY    . TOTAL ABDOMINAL HYSTERECTOMY    . VERTEBROPLASTY  Sept 7 and May 30, 2014   x 2, T11/T12  . WISDOM TOOTH EXTRACTION      OB History   No obstetric history on file.      Home Medications     Prior to Admission medications   Medication Sig Start Date End Date Taking? Authorizing Provider  acetaminophen (TYLENOL) 500 MG tablet Take 500-1,000 mg by mouth every 6 (six) hours as needed for mild pain or fever.     [provider]  albuterol (VENTOLIN HFA) 108 (90 Base) MCG/ACT inhaler Inhale 1-2 puffs into the lungs every 6 (six) hours as needed for wheezing or shortness of breath. 05/14/20   Jhoana Upham C, PA-C  alendronate (FOSAMAX) 70 MG tablet Take 1 tablet by mouth every Tuesday.  04/01/15   [provider]  allopurinol (ZYLOPRIM) 100 MG tablet Take 100 mg by mouth 2 (two) times daily.  01/14/16   [provider]  benzonatate (TESSALON) 200 MG capsule Take 1 capsule (200 mg total) by mouth 3 (three) times daily as needed for up to 7 days for cough. 05/14/20 05/21/20  Hari Casaus C, PA-C  CALCIUM PO Take 500 mg by mouth daily.    [provider]  cetirizine (ZYRTEC) 10 MG tablet Take 10 mg by mouth daily.    [provider]  Cholecalciferol (VITAMIN D3) 2000 UNITS TABS Take 2,000 Units by mouth daily.     [provider]  dextromethorphan-guaiFENesin (MUCINEX DM) 30-600 MG 12hr tablet Take 1 tablet by mouth 2 (two) times daily. 05/14/20   Alcides Nutting C, PA-C  doxycycline (VIBRAMYCIN) 100 MG capsule Take 1 capsule (100 mg total) by mouth 2 (two) times daily. 05/14/20   Helina Hullum C, PA-C  ELIQUIS 5 MG TABS tablet TAKE 1 TABLET BY MOUTH TWICE A DAY 02/24/20   Allred, Jeneen Rinks, MD  famotidine (PEPCID) 20 MG tablet Take 20 mg by mouth daily.    [provider]  folic acid (FOLVITE) 1 MG tablet Take 1 mg by mouth 4 (four) times daily.    [provider]  furosemide (LASIX) 40 MG tablet Take 1 tablet (40 mg total) by mouth 2 (two) times daily. 08/18/15   [name], [name], MD  levothyroxine (SYNTHROID) 88 MCG tablet Take 88 mcg by mouth daily.  07/07/17   [provider]  losartan (COZAAR) 25 MG tablet  Take 1 tablet (25 mg total) by mouth daily. 11/20/19 02/18/20  Fenton, Clint R, PA  methotrexate (RHEUMATREX) 2.5 MG tablet Take 12.5 mg by mouth every Wednesday.  09/17/14   [provider]  metoprolol succinate (TOPROL-XL) 25 MG 24 hr tablet TAKE 1 AND 1/2 TABLETS BY  MOUTH TWICE DAILY 02/11/20   Allred, Jeneen Rinks, MD  Multiple Vitamin (MULTIVITAMIN) tablet Take 1 tablet by mouth daily.      [provider]  OVER  THE COUNTER MEDICATION Place 1 drop into both eyes daily as needed (for dry eyes. Advanced eye relief).    [provider]  phenylephrine-shark liver oil-mineral oil-petrolatum (PREPARATION H) 0.25-3-14-71.9 % rectal ointment Place 1 application rectally as needed for hemorrhoids.    [provider]  potassium chloride SA (KLOR-CON M20) 20 MEQ tablet Take 1 tablet (20 mEq total) by mouth 3 (three) times daily. 08/18/15   [name], [name], MD  predniSONE (DELTASONE) 10 MG tablet Begin with 6 tabs on day 1, 5 tab on day 2, 4 tab on day 3, 3 tab on day 4, 2 tab on day 5, 1 tab on day 6-take with food 05/14/20   Heli Dino, [employer] C, PA-C  predniSONE (DELTASONE) 5 MG tablet Take 5 mg by mouth daily with breakfast.    [provider]  rosuvastatin (CRESTOR) 5 MG tablet Take 5 mg by mouth every other day.     [provider]  sotalol (BETAPACE) 80 MG tablet TAKE 1 TABLET BY MOUTH  TWICE DAILY 12/31/19   Allred, Jeneen Rinks, MD  vitamin B-12 (CYANOCOBALAMIN) 1000 MCG tablet Take 1,000 mcg by mouth daily.    [provider]    Family History Family History  Problem Relation Age of Onset  . Colon cancer Mother        mets, spot on lungs and spine  . Coronary artery disease Father   . Emphysema Father   . Heart attack Brother        x 2  . Colon cancer Paternal Uncle   . Kidney disease Brother   . Hypertension Brother   . Stroke Neg Hx   . Esophageal cancer Neg Hx     Social History Social History   Tobacco Use  . Smoking status: Never  Smoker  . Smokeless tobacco: Never Used  Vaping Use  . Vaping Use: Never used  Substance Use Topics  . Alcohol use: No  . Drug use: No     Allergies   Butoconazole, Keflex [cephalexin], and Lipitor [atorvastatin calcium]   Review of Systems Review of Systems  Constitutional: Positive for fatigue. Negative for activity change, appetite change, chills and fever.  HENT: Positive for congestion, rhinorrhea and sore throat. Negative for ear pain, sinus pressure and trouble swallowing.   Eyes: Negative for discharge and redness.  Respiratory: Positive for cough, chest tightness and shortness of breath.   Cardiovascular: Negative for chest pain.  Gastrointestinal: Negative for abdominal pain, diarrhea, nausea and vomiting.  Musculoskeletal: Negative for myalgias.  Skin: Negative for rash.  Neurological: Negative for dizziness, light-headedness and headaches.     Physical Exam Triage Vital Signs ED Triage Vitals  Enc Vitals Group     BP 05/14/20 1704 135/63     Pulse Rate 05/14/20 1704 74     Resp 05/14/20 1704 18     Temp 05/14/20 1704 97.7 F (36.5 C)     Temp Source 05/14/20 1704 Oral     SpO2 05/14/20 1704 (!) 89 %     Weight --      Height --      Head Circumference --      Peak Flow --      Pain Score 05/14/20 1724 2     Pain Loc --      Pain Edu? --      Excl. in [place]? --    No data found.  Updated Vital Signs BP 135/63 (BP Location: Left Arm)   Pulse  72   Temp 97.7 F (36.5 C) (Oral)   Resp 18   SpO2 96%   Visual Acuity Right Eye Distance:   Left Eye Distance:   Bilateral Distance:    Right Eye Near:   Left Eye Near:    Bilateral Near:     Physical Exam Vitals and nursing note reviewed.  Constitutional:      Appearance: She is well-developed.     Comments: No acute distress  HENT:     Head: Normocephalic and atraumatic.     Ears:     Comments: Bilateral ears without tenderness to palpation of external auricle, tragus and mastoid, EAC's without  erythema or swelling, TM's with good bony landmarks and cone of light. Non erythematous.     Nose: Nose normal.     Mouth/Throat:     Comments: Oral mucosa pink and moist, no tonsillar enlargement or exudate. Posterior pharynx patent and nonerythematous, no uvula deviation or swelling. Normal phonation.  Eyes:     Conjunctiva/sclera: Conjunctivae normal.  Cardiovascular:     Rate and Rhythm: Normal rate.  Pulmonary:     Effort: Pulmonary effort is normal. No respiratory distress.     Comments: Significant inspiratory and expiratory wheezing throughout bilateral lung fields, frequent coarse cough during visit Abdominal:     General: There is no distension.  Musculoskeletal:        General: Normal range of motion.     Cervical back: Neck supple.  Skin:    General: Skin is warm and dry.  Neurological:     Mental Status: She is alert and oriented to person, place, and time.      UC Treatments / Results  Labs (all labs ordered are listed, but only abnormal results are displayed) Labs Reviewed - No data to display  EKG   Radiology DG Chest 2 View  Result Date: 05/14/2020 CLINICAL DATA:  74 year old female with cough and shortness of breath. EXAM: CHEST - 2 VIEW COMPARISON:  Chest radiograph dated 07/08/2019. FINDINGS: There is diffuse chronic interstitial coarsening and bronchitic changes. No focal consolidation, pleural effusion, pneumothorax. Top-normal cardiac size. Atherosclerotic calcification of the aorta. Osteopenia with degenerative changes of the spine. Lower thoracic old compression fracture with anterior wedging status post vertebroplasty. No acute osseous pathology. Loop recorder device. IMPRESSION: No acute cardiopulmonary process. Electronically Signed   By: Anner Crete M.D.   On: 05/14/2020 18:36    Procedures Procedures (including critical care time)  Medications Ordered in UC Medications  dexamethasone (DECADRON) 10 MG/ML injection for Pediatric ORAL use  10 mg (10 mg Oral Given 05/14/20 1824)    Initial Impression / Assessment and Plan / UC Course  I have reviewed the triage vital signs and the nursing notes.  Pertinent labs & imaging results that were available during my care of the patient were reviewed by me and considered in my medical decision making (see chart for details).     X-ray negative for pneumonia, suggestive of chronic bronchitis, given this in combination with wheezing will provide Decadron prior to discharge, continuing on course of prednisone to help with shortness of breath and albuterol inhaler.  Is currently on prednisone 5 mg daily, increasing to taper x6 days and then return back to 5 mg daily.  Doxycycline to cover atypicals given symptoms greater than 1 week.  Monitor for any signs of retaining fluid with use of prednisone.  Tessalon for cough.  Mucinex DM to further help with congestion and cough.  Discussed strict  return precautions. Patient verbalized understanding and is agreeable with plan.  Final Clinical Impressions(s) / UC Diagnoses   Final diagnoses:  Lower respiratory infection (e.g., bronchitis, pneumonia, pneumonitis, pulmonitis)     Discharge Instructions     Begin doxycycline twice daily for the next 10 days Prednisone taper over the next 6 days-60 mg / 6 tablets tomorrow morning, decrease by 1 tablet each day until complete, then return to your daily 5 mg dose Albuterol inhaler as needed for shortness of breath and wheezing Tessalon for daytime cough, cough syrup for bedtime Mucinex DM to further help with congestion and cough Follow-up if not improving or worsening    ED Prescriptions    Medication Sig Dispense Auth. Provider   predniSONE (DELTASONE) 10 MG tablet Begin with 6 tabs on day 1, 5 tab on day 2, 4 tab on day 3, 3 tab on day 4, 2 tab on day 5, 1 tab on day 6-take with food 21 tablet Haddy Mullinax C, PA-C   benzonatate (TESSALON) 200 MG capsule Take 1 capsule (200 mg total) by  mouth 3 (three) times daily as needed for up to 7 days for cough. 28 capsule Tkai Large C, PA-C   albuterol (VENTOLIN HFA) 108 (90 Base) MCG/ACT inhaler Inhale 1-2 puffs into the lungs every 6 (six) hours as needed for wheezing or shortness of breath. 18 g Irine Heminger C, PA-C   doxycycline (VIBRAMYCIN) 100 MG capsule Take 1 capsule (100 mg total) by mouth 2 (two) times daily. 20 capsule Lillion Elbert C, PA-C   dextromethorphan-guaiFENesin (MUCINEX DM) 30-600 MG 12hr tablet Take 1 tablet by mouth 2 (two) times daily. 20 tablet Janeene Sand, [place] C, PA-C     PDMP not reviewed this encounter.   Janith Lima, [place] 05/14/20 2115

## 2020-05-14 NOTE — Discharge Instructions (Signed)
Begin doxycycline twice daily for the next 10 days Prednisone taper over the next 6 days-60 mg / 6 tablets tomorrow morning, decrease by 1 tablet each day until complete, then return to your daily 5 mg dose Albuterol inhaler as needed for shortness of breath and wheezing Tessalon for daytime cough, cough syrup for bedtime Mucinex DM to further help with congestion and cough Follow-up if not improving or worsening

## 2020-05-14 NOTE — ED Triage Notes (Signed)
Pt c//o cough, congestion, wheezing, and SOB for over a week. States was seen on Monday and all she received was tussinex. Pt states feels worse and wheezing is worse. States had a neg covid test on Friday.

## 2020-05-15 ENCOUNTER — Telehealth: Payer: Self-pay | Admitting: Internal Medicine

## 2020-05-15 NOTE — Telephone Encounter (Signed)
Went to urgent care on 11/29 got cough medication. Went back last night to urgent care and had chest xray.  Medications order: Begin doxycycline twice daily for the next 10 days Prednisone taper over the next 6 days-60 mg / 6 tablets tomorrow morning, decrease by 1 tablet each day until complete, then return to your daily 5 mg dose Albuterol inhaler as needed for shortness of breath and wheezing Tessalon for daytime cough, cough syrup for bedtime Mucinex DM to further help with congestion and cough.   Asking if new medications are okay to take with heart medications. Will get advisement from Pharmacist in house.     Advised patient to take tylenol for her sore chest from coughing and states she is planning on doing this because urgent care advised the same.   Spoke to the pharmacist [place]. He reviewed medications added and okayed them.  Notified patient. Verbalized understanding. "i'll go pick them up and get started, thank you".

## 2020-05-15 NOTE — Telephone Encounter (Signed)
Patient states for the past 2 weeks she has had a slight fever, headaches, a cough, and a sore throat. She called her PCP to be evaluated and she was told her that they will not see her w/o negative COVID test due to symptoms. Patient states she got tested for COVID and by 05/12/20 she tested negative. She informed her PCP of negative result and they made her aware that she may have bronchitis or a lower respiratory infection. Patient states for the past 2 days she has also experienced pain in her chest- she is concerned it may be pneumonia.   Pt c/o of Chest Pain: STAT if CP now or developed within 24 hours  1. Are you having CP right now? No, however, patient states her chest is tender and sore due to coughing often. She assumes there may be inflammation causing the irritation.     2. Are you experiencing any other symptoms (ex. SOB, nausea, vomiting, sweating)? Cough, headache, slight fever, sore throat  3. How long have you been experiencing CP? About 2 days  4. Is your CP continuous or coming and going? Coming and going   5. Have you taken Nitroglycerin? No  ?

## 2020-05-20 DIAGNOSIS — R0781 Pleurodynia: Secondary | ICD-10-CM | POA: Diagnosis not present

## 2020-05-20 DIAGNOSIS — J988 Other specified respiratory disorders: Secondary | ICD-10-CM | POA: Diagnosis not present

## 2020-05-20 DIAGNOSIS — R059 Cough, unspecified: Secondary | ICD-10-CM | POA: Diagnosis not present

## 2020-05-21 DIAGNOSIS — Z79899 Other long term (current) drug therapy: Secondary | ICD-10-CM | POA: Diagnosis not present

## 2020-05-21 DIAGNOSIS — M810 Age-related osteoporosis without current pathological fracture: Secondary | ICD-10-CM | POA: Diagnosis not present

## 2020-05-21 DIAGNOSIS — M150 Primary generalized (osteo)arthritis: Secondary | ICD-10-CM | POA: Diagnosis not present

## 2020-05-21 DIAGNOSIS — M549 Dorsalgia, unspecified: Secondary | ICD-10-CM | POA: Diagnosis not present

## 2020-05-21 DIAGNOSIS — M3390 Dermatopolymyositis, unspecified, organ involvement unspecified: Secondary | ICD-10-CM | POA: Diagnosis not present

## 2020-05-25 ENCOUNTER — Telehealth: Payer: Self-pay

## 2020-05-25 NOTE — Telephone Encounter (Signed)
The pt states she has had bronchitis for a month and if anything is on the transmission it is because of the bronchitis.

## 2020-06-02 LAB — CUP PACEART REMOTE DEVICE CHECK
Date Time Interrogation Session: 20211221013251
Implantable Pulse Generator Implant Date: 20200710

## 2020-06-08 ENCOUNTER — Ambulatory Visit (INDEPENDENT_AMBULATORY_CARE_PROVIDER_SITE_OTHER): Payer: Medicare Other

## 2020-06-08 DIAGNOSIS — I480 Paroxysmal atrial fibrillation: Secondary | ICD-10-CM

## 2020-06-11 ENCOUNTER — Other Ambulatory Visit (HOSPITAL_COMMUNITY): Payer: Self-pay | Admitting: Physician Assistant

## 2020-06-11 DIAGNOSIS — I10 Essential (primary) hypertension: Secondary | ICD-10-CM

## 2020-06-19 NOTE — Progress Notes (Signed)
[facility] Summary Report / Loop Recorder 

## 2020-07-13 ENCOUNTER — Ambulatory Visit (INDEPENDENT_AMBULATORY_CARE_PROVIDER_SITE_OTHER): Payer: Medicare Other

## 2020-07-13 DIAGNOSIS — I480 Paroxysmal atrial fibrillation: Secondary | ICD-10-CM

## 2020-07-13 LAB — CUP PACEART REMOTE DEVICE CHECK
Date Time Interrogation Session: 20220129234321
Implantable Pulse Generator Implant Date: 20200710

## 2020-07-21 DIAGNOSIS — H35372 Puckering of macula, left eye: Secondary | ICD-10-CM | POA: Diagnosis not present

## 2020-07-21 DIAGNOSIS — H43813 Vitreous degeneration, bilateral: Secondary | ICD-10-CM | POA: Diagnosis not present

## 2020-07-22 NOTE — Progress Notes (Signed)
[facility] Summary Report / Loop Recorder 

## 2020-07-23 ENCOUNTER — Telehealth: Payer: Self-pay | Admitting: Internal Medicine

## 2020-07-23 NOTE — Telephone Encounter (Signed)
    Pt c/o medication issue:  1. Name of Medication: hydrocodone chlorphen   2. How are you currently taking this medication (dosage and times per day)?  3. Are you having a reaction (difficulty breathing--STAT)?   4. What is your medication issue? Pt said she was prescribed this medications for their cough, however, when pt started to take it she didn't fell well and might have triggered her Afib. She just wanted to let Dr. Rayann Heman to never prescribe this medication to her ever again.

## 2020-08-17 ENCOUNTER — Ambulatory Visit (INDEPENDENT_AMBULATORY_CARE_PROVIDER_SITE_OTHER): Payer: Medicare Other

## 2020-08-17 DIAGNOSIS — I480 Paroxysmal atrial fibrillation: Secondary | ICD-10-CM

## 2020-08-19 LAB — CUP PACEART REMOTE DEVICE CHECK
Date Time Interrogation Session: 20220301234353
Implantable Pulse Generator Implant Date: 20200710

## 2020-08-24 ENCOUNTER — Other Ambulatory Visit: Payer: Self-pay | Admitting: Internal Medicine

## 2020-08-24 NOTE — Telephone Encounter (Signed)
Pt's age 75, wt 32 kg, SCr 1.08, CrCl 70.7, last ov w/ 03/27/20.

## 2020-08-26 NOTE — Progress Notes (Signed)
[facility] Summary Report / Loop Recorder 

## 2020-08-28 ENCOUNTER — Emergency Department (HOSPITAL_COMMUNITY): Payer: Medicare Other

## 2020-08-28 ENCOUNTER — Emergency Department (HOSPITAL_COMMUNITY)
Admission: EM | Admit: 2020-08-28 | Discharge: 2020-08-28 | Disposition: A | Discharge status: Home / Self Care | Payer: Medicare Other | Attending: Emergency Medicine | Admitting: Emergency Medicine

## 2020-08-28 ENCOUNTER — Other Ambulatory Visit: Payer: Self-pay

## 2020-08-28 DIAGNOSIS — I2510 Atherosclerotic heart disease of native coronary artery without angina pectoris: Secondary | ICD-10-CM | POA: Insufficient documentation

## 2020-08-28 DIAGNOSIS — Z7901 Long term (current) use of anticoagulants: Secondary | ICD-10-CM | POA: Diagnosis not present

## 2020-08-28 DIAGNOSIS — Z043 Encounter for examination and observation following other accident: Secondary | ICD-10-CM | POA: Diagnosis not present

## 2020-08-28 DIAGNOSIS — I5032 Chronic diastolic (congestive) heart failure: Secondary | ICD-10-CM | POA: Insufficient documentation

## 2020-08-28 DIAGNOSIS — S01511A Laceration without foreign body of lip, initial encounter: Secondary | ICD-10-CM | POA: Insufficient documentation

## 2020-08-28 DIAGNOSIS — W109XXA Fall (on) (from) unspecified stairs and steps, initial encounter: Secondary | ICD-10-CM | POA: Insufficient documentation

## 2020-08-28 DIAGNOSIS — I517 Cardiomegaly: Secondary | ICD-10-CM | POA: Diagnosis not present

## 2020-08-28 DIAGNOSIS — S0181XA Laceration without foreign body of other part of head, initial encounter: Secondary | ICD-10-CM | POA: Diagnosis not present

## 2020-08-28 DIAGNOSIS — S8001XA Contusion of right knee, initial encounter: Secondary | ICD-10-CM | POA: Insufficient documentation

## 2020-08-28 DIAGNOSIS — W19XXXA Unspecified fall, initial encounter: Secondary | ICD-10-CM

## 2020-08-28 DIAGNOSIS — S0990XA Unspecified injury of head, initial encounter: Secondary | ICD-10-CM | POA: Diagnosis present

## 2020-08-28 DIAGNOSIS — S61411A Laceration without foreign body of right hand, initial encounter: Secondary | ICD-10-CM

## 2020-08-28 DIAGNOSIS — I10 Essential (primary) hypertension: Secondary | ICD-10-CM | POA: Diagnosis not present

## 2020-08-28 DIAGNOSIS — Z79899 Other long term (current) drug therapy: Secondary | ICD-10-CM | POA: Diagnosis not present

## 2020-08-28 DIAGNOSIS — Z743 Need for continuous supervision: Secondary | ICD-10-CM | POA: Diagnosis not present

## 2020-08-28 DIAGNOSIS — Y92009 Unspecified place in unspecified non-institutional (private) residence as the place of occurrence of the external cause: Secondary | ICD-10-CM | POA: Insufficient documentation

## 2020-08-28 DIAGNOSIS — R6889 Other general symptoms and signs: Secondary | ICD-10-CM | POA: Diagnosis not present

## 2020-08-28 DIAGNOSIS — Z23 Encounter for immunization: Secondary | ICD-10-CM | POA: Diagnosis not present

## 2020-08-28 DIAGNOSIS — E039 Hypothyroidism, unspecified: Secondary | ICD-10-CM | POA: Insufficient documentation

## 2020-08-28 DIAGNOSIS — I110 Hypertensive heart disease with heart failure: Secondary | ICD-10-CM | POA: Diagnosis not present

## 2020-08-28 DIAGNOSIS — M25532 Pain in left wrist: Secondary | ICD-10-CM | POA: Diagnosis not present

## 2020-08-28 DIAGNOSIS — M79642 Pain in left hand: Secondary | ICD-10-CM | POA: Diagnosis not present

## 2020-08-28 DIAGNOSIS — S0993XA Unspecified injury of face, initial encounter: Secondary | ICD-10-CM | POA: Diagnosis not present

## 2020-08-28 DIAGNOSIS — S61412A Laceration without foreign body of left hand, initial encounter: Secondary | ICD-10-CM | POA: Insufficient documentation

## 2020-08-28 DIAGNOSIS — R220 Localized swelling, mass and lump, head: Secondary | ICD-10-CM | POA: Diagnosis not present

## 2020-08-28 LAB — I-STAT CHEM 8, ED
BUN: 22 mg/dL (ref 8–23)
Calcium, Ion: 1.22 mmol/L (ref 1.15–1.40)
Chloride: 103 mmol/L (ref 98–111)
Creatinine, Ser: 0.8 mg/dL (ref 0.44–1.00)
Glucose, Bld: 102 mg/dL — ABNORMAL HIGH (ref 70–99)
HCT: 39 % (ref 36.0–46.0)
Hemoglobin: 13.3 g/dL (ref 12.0–15.0)
Potassium: 4.2 mmol/L (ref 3.5–5.1)
Sodium: 142 mmol/L (ref 135–145)
TCO2: 29 mmol/L (ref 22–32)

## 2020-08-28 LAB — CBC
HCT: 41.8 % (ref 36.0–46.0)
Hemoglobin: 13.6 g/dL (ref 12.0–15.0)
MCH: 34.1 pg — ABNORMAL HIGH (ref 26.0–34.0)
MCHC: 32.5 g/dL (ref 30.0–36.0)
MCV: 104.8 fL — ABNORMAL HIGH (ref 80.0–100.0)
Platelets: 133 10*3/uL — ABNORMAL LOW (ref 150–400)
RBC: 3.99 MIL/uL (ref 3.87–5.11)
RDW: 15.9 % — ABNORMAL HIGH (ref 11.5–15.5)
WBC: 9.4 10*3/uL (ref 4.0–10.5)
nRBC: 0 % (ref 0.0–0.2)

## 2020-08-28 LAB — ETHANOL: Alcohol, Ethyl (B): <10 mg/dL (ref <10)

## 2020-08-28 MED ORDER — ONDANSETRON HCL 4 MG/2ML IJ SOLN
4.0000 mg | Freq: Once | INTRAMUSCULAR | Status: DC
  Filled 2020-08-28: qty 2

## 2020-08-28 MED ORDER — MORPHINE SULFATE (PF) 4 MG/ML IV SOLN: 4.0000 mg | Freq: Once | INTRAVENOUS | Status: DC

## 2020-08-28 MED ORDER — TETANUS-DIPHTH-ACELL PERTUSSIS 5-2.5-18.5 LF-MCG/0.5 IM SUSY
0.5000 mL | PREFILLED_SYRINGE | Freq: Once | INTRAMUSCULAR | Status: AC
  Administered 2020-08-28: 0.5 mL via INTRAMUSCULAR
  Filled 2020-08-28: qty 0.5

## 2020-08-28 NOTE — ED Notes (Signed)
Pt large

## 2020-08-28 NOTE — ED Provider Notes (Signed)
[facility] EMERGENCY DEPARTMENT Provider Note   CSN: [id number] Arrival date & time: 08/28/20  1513     History Chief Complaint  Patient presents with  . Fall    [name] is a 75 y.o. female.  Patient is a 75 year old female with a history of atrial fibrillation on Xarelto who presents after mechanical fall.  Patient was walking into her house today when she tripped and fell onto her face.  Currently complaining of right knee pain, left hand pain, and facial pain.  Patient does have a hematoma present on her forehead.  Patient is currently awake alert and oriented.  She presents with EMS.  She has no chest pain, shortness of breath, abdominal pain.  She denies any additional concerns or complaints.  Patient reports that she last took her Xarelto this morning.  Has not taken anything for pain control.  Denies any changes in vision or changes in swallowing.  Denies neck pain or back pain.    Past Medical History:  Diagnosis Date  . Anemia   . Bilateral leg edema   . C. difficile enteritis   . CAD (coronary artery disease)    a. LHC 03/2006: EF 65%, mid LAD 40-50%, ostial D1 70-80%, normal circumflex, normal RCA;  b. Lexiscan Myoview 09/2010: No ischemia or scar, EF 75%;  c. Lex MV 5/14:  Normal, no ischemia, EF 71% // Myoview 08/2018: EF 43, normal perfusion; Intermediate Risk due to low EF (Echo 07/2018 with normal EF)   . Cataract    bilateral-had surgery  . Cellulitis of right leg    Hx - resolved  . Chronic diastolic CHF (congestive heart failure) (HCC)    Echo 8/14:  Mild LVH, EF 55-65%, normal wall motion, Tr AI, mild MR, mod TR, PASP 45 // 12/2014 Echo: EF 60-65%, nl wall motion, mildly dil RA/LA, PASP 31mHg.// Echo 07/2018: EF 60-65, mild LVH, mod diastolic dysfunction, normal RVSF, severe LAE, mod MAC, mild MR, mild TR   . Continuous urine leakage   . Dermatomyositis ([place]   . Deviated septum   . Essential hypertension   . Family history of adverse  reaction to anesthesia    .' MY SON IS A DIFFICULT INTUBATION"  . Gall stones    a. 08/2014 Abd U/S:  Large gallstone measuring 4.2 cm.  .Marland KitchenGERD (gastroesophageal reflux disease)   . Gout   . Hiatal hernia   . History of pancreatitis   . History of PFTs    a. PFTs 10/2012: normal.   . Hyperlipidemia   . Hypothyroidism   . Internal hemorrhoids   . Morbid obesity ([place]   . Multifocal atrial tachycardia ([facility] 08/10/2018   Noted during admission in 07/2018  . Osteoarthritis    spine hips knees  . PAF (paroxysmal atrial fibrillation) (HCC)    a. CHA2DS2VASc = 5-->poor OAC candidate 2/2 falls. No problems now per patient 03/2016  . Pneumonia   . Seizures ([facility]    as a baby  . Shingles    Hx  . Sleep apnea    Does not use CPAP  . Spinal cord compression ([facility] 02/21/2014    Patient Active Problem List   Diagnosis Date Noted  . Secondary hypercoagulable state ([facility] 06/24/2019  . Multifocal atrial tachycardia ([facility] 08/10/2018  . Chest pain   . Cellulitis, bilateral LE but R>L, abd wall  08/13/2015  . Sepsis due to cellulitis ([place] 08/13/2015  . Acute kidney injury ([facility] 08/13/2015  .  Hypokalemia 08/13/2015  . History of iron deficiency anemia 08/13/2015  . Thrombocytopenia ([place]) 08/13/2015  . Sepsis ([place]) 08/13/2015  . Chronic diastolic CHF (congestive heart failure) ([place])   . Paroxysmal atrial fibrillation (HCC)   . Essential hypertension   . Dermatomyositis ([place]) 02/18/2014  . Morbid obesity ([place]) 12/09/2013  . CAD (coronary artery disease) 09/25/2008  . LEG EDEMA, BILATERAL 09/20/2008    Past Surgical History:  Procedure Laterality Date  . CATARACT EXTRACTION, BILATERAL Bilateral   . COLONOSCOPY  03/2011  . Implantable loop recorder placement  12/21/2018   Medtronic Reveal [place] model [id number] (SN [id number]) implanted by Dr Rayann Heman in office for AF management  . NASAL SEPTUM SURGERY    . TOTAL ABDOMINAL HYSTERECTOMY    . VERTEBROPLASTY  Sept 7 and May 30, 2014   x 2,  T11/T12  . WISDOM TOOTH EXTRACTION       OB History   No obstetric history on file.     Family History  Problem Relation Age of Onset  . Colon cancer Mother        mets, spot on lungs and spine  . Coronary artery disease Father   . Emphysema Father   . Heart attack Brother        x 2  . Colon cancer Paternal Uncle   . Kidney disease Brother   . Hypertension Brother   . Stroke Neg Hx   . Esophageal cancer Neg Hx     Social History   Tobacco Use  . Smoking status: Never Smoker  . Smokeless tobacco: Never Used  Vaping Use  . Vaping Use: Never used  Substance Use Topics  . Alcohol use: No  . Drug use: No    Home Medications Prior to Admission medications   Medication Sig Start Date End Date Taking? Authorizing Provider  acetaminophen (TYLENOL) 500 MG tablet Take 500-1,000 mg by mouth every 6 (six) hours as needed for mild pain or fever.     [provider]  albuterol (VENTOLIN HFA) 108 (90 Base) MCG/ACT inhaler Inhale 1-2 puffs into the lungs every 6 (six) hours as needed for wheezing or shortness of breath. 05/14/20   Wieters, Hallie C, PA-C  alendronate (FOSAMAX) 70 MG tablet Take 1 tablet by mouth every Tuesday.  04/01/15   [provider]  allopurinol (ZYLOPRIM) 100 MG tablet Take 100 mg by mouth 2 (two) times daily.  01/14/16   [provider]  CALCIUM PO Take 500 mg by mouth daily.    [provider]  cetirizine (ZYRTEC) 10 MG tablet Take 10 mg by mouth daily.    [provider]  Cholecalciferol (VITAMIN D3) 2000 UNITS TABS Take 2,000 Units by mouth daily.     [provider]  dextromethorphan-guaiFENesin (MUCINEX DM) 30-600 MG 12hr tablet Take 1 tablet by mouth 2 (two) times daily. 05/14/20   Wieters, Hallie C, PA-C  doxycycline (VIBRAMYCIN) 100 MG capsule Take 1 capsule (100 mg total) by mouth 2 (two) times daily. 05/14/20   Wieters, Hallie C, PA-C  ELIQUIS 5 MG TABS tablet TAKE 1 TABLET BY MOUTH TWICE A DAY 08/24/20    Allred, Jeneen Rinks, MD  famotidine (PEPCID) 20 MG tablet Take 20 mg by mouth daily.    [provider]  folic acid (FOLVITE) 1 MG tablet Take 1 mg by mouth 4 (four) times daily.    [provider]  furosemide (LASIX) 40 MG tablet Take 1 tablet (40 mg total) by mouth 2 (two) times  daily. 08/18/15   [name], [name], MD  levothyroxine (SYNTHROID) 88 MCG tablet Take 88 mcg by mouth daily.  07/07/17   [provider]  losartan (COZAAR) 25 MG tablet TAKE 1 TABLET BY MOUTH  DAILY 06/11/20   Fenton, Clint R, PA  methotrexate (RHEUMATREX) 2.5 MG tablet Take 12.5 mg by mouth every Wednesday.  09/17/14   [provider]  metoprolol succinate (TOPROL-XL) 25 MG 24 hr tablet TAKE 1 AND 1/2 TABLETS BY  MOUTH TWICE DAILY 02/11/20   Allred, Jeneen Rinks, MD  Multiple Vitamin (MULTIVITAMIN) tablet Take 1 tablet by mouth daily.      [provider]  OVER THE COUNTER MEDICATION Place 1 drop into both eyes daily as needed (for dry eyes. Advanced eye relief).    [provider]  phenylephrine-shark liver oil-mineral oil-petrolatum (PREPARATION H) 0.25-3-14-71.9 % rectal ointment Place 1 application rectally as needed for hemorrhoids.    [provider]  potassium chloride SA (KLOR-CON M20) 20 MEQ tablet Take 1 tablet (20 mEq total) by mouth 3 (three) times daily. 08/18/15   [name], [name], MD  predniSONE (DELTASONE) 10 MG tablet Begin with 6 tabs on day 1, 5 tab on day 2, 4 tab on day 3, 3 tab on day 4, 2 tab on day 5, 1 tab on day 6-take with food 05/14/20   Wieters, [employer] C, PA-C  predniSONE (DELTASONE) 5 MG tablet Take 5 mg by mouth daily with breakfast.    [provider]  rosuvastatin (CRESTOR) 5 MG tablet Take 5 mg by mouth every other day.     [provider]  sotalol (BETAPACE) 80 MG tablet TAKE 1 TABLET BY MOUTH  TWICE DAILY 12/31/19   Allred, Jeneen Rinks, MD  vitamin B-12 (CYANOCOBALAMIN) 1000 MCG tablet Take 1,000 mcg by mouth daily.    [provider]    Allergies    Butoconazole, Keflex [cephalexin], and Lipitor [atorvastatin calcium]  Review of Systems   Review of Systems  Constitutional: Negative for chills and fever.  HENT: Negative for ear pain and sore throat.   Eyes: Negative for pain and visual disturbance.  Respiratory: Negative for cough and shortness of breath.   Cardiovascular: Negative for chest pain and palpitations.  Gastrointestinal: Negative for abdominal pain and vomiting.  Genitourinary: Negative for dysuria and hematuria.  Musculoskeletal: Negative for arthralgias and back pain.  Skin: Positive for wound. Negative for color change.  Neurological: Positive for headaches. Negative for seizures.  All other systems reviewed and are negative.   Physical Exam Updated Vital Signs BP (!) 182/86 (BP Location: Right Wrist)   Pulse 78   Temp 98.1 F (36.7 C) (Oral)   Resp 18   SpO2 98%   Physical Exam Vitals and nursing note reviewed.  Constitutional:      General: She is not in acute distress.    Appearance: She is well-developed.  HENT:     Head: Normocephalic.     Comments: Trauma noted to the lips, mouth, nose, upper forehead.  Hematoma present.  No active bleeding.  No additional scalp lacerations or contusions.    Right Ear: External ear normal.     Left Ear: External ear normal.     Nose:     Comments: No septal hematoma noted.    Mouth/Throat:     Mouth: Mucous membranes are moist.     Comments: Dried blood around the lips. Eyes:     General:        Right  eye: No discharge.     Extraocular Movements: Extraocular movements intact.     Conjunctiva/sclera: Conjunctivae normal.  Neck:     Comments: No cervical spine tenderness. Cardiovascular:     Rate and Rhythm: Normal rate and regular rhythm.  Pulmonary:     Effort: Pulmonary effort is normal.     Comments: Bilateral breath sounds noted Chest:     Chest wall: No tenderness.  Abdominal:     Palpations: Abdomen is soft.      Tenderness: There is no abdominal tenderness.     Comments: No abdominal bruising  Musculoskeletal:     Cervical back: Neck supple.     Comments: Bruising noted to the right knee.  No tenderness or deformity Injury to the left hand with skin tear. No T or L spine tenderness  Skin:    General: Skin is warm and dry.  Neurological:     General: No focal deficit present.     Mental Status: She is alert and oriented to person, place, and time.     Comments: Moving all extremities freely and equally    ED Results / Procedures / Treatments   Labs (all labs ordered are listed, but only abnormal results are displayed) Labs Reviewed  CBC - Abnormal; Notable for the following components:      Result Value   MCV 104.8 (*)    MCH 34.1 (*)    RDW 15.9 (*)    Platelets 133 (*)    All other components within normal limits  I-STAT CHEM 8, ED - Abnormal; Notable for the following components:   Glucose, Bld 102 (*)    All other components within normal limits  RESP PANEL BY RT-PCR (FLU A&B, COVID) [id number]  ETHANOL   EKG EKG Interpretation  Date/Time:  Friday August 28 2020 15:30:05 EDT Ventricular Rate:  72 PR Interval:    QRS Duration: 84 QT Interval:  417 QTC Calculation: 457 R Axis:   41 Text Interpretation: Atrial fibrillation Low voltage, precordial leads Borderline T abnormalities, diffuse leads No significant change since last tracing Confirmed by Wandra Arthurs [phone]) on 08/28/2020 4:54:46 PM  Radiology DG Wrist Complete Left  Result Date: 08/28/2020 CLINICAL DATA:  Pain EXAM: LEFT WRIST - COMPLETE 3+ VIEW COMPARISON:  None. FINDINGS: There is no evidence of fracture or dislocation. There is no evidence of arthropathy or other focal bone abnormality. Soft tissues are unremarkable. IMPRESSION: Negative. Electronically Signed   By: Dorise Bullion III M.D   On: 08/28/2020 16:43   CT HEAD WO CONTRAST  Result Date: 08/28/2020 CLINICAL DATA:  Fall onto face EXAM: CT HEAD WITHOUT  CONTRAST TECHNIQUE: Contiguous axial images were obtained from the base of the skull through the vertex without intravenous contrast. COMPARISON:  None. FINDINGS: Brain: Mild age related volume loss. No acute intracranial abnormality. Specifically, no hemorrhage, hydrocephalus, mass lesion, acute infarction, or significant intracranial injury. Vascular: No hyperdense vessel or unexpected calcification. Skull: No acute calvarial abnormality. Sinuses/Orbits: Visualized paranasal sinuses and mastoids clear. Orbital soft tissues unremarkable. Other: Soft tissue swelling over the right forehead. IMPRESSION: No acute intracranial abnormality. Electronically Signed   By: Rolm Baptise M.D.   On: 08/28/2020 17:47   CT CERVICAL SPINE WO CONTRAST  Result Date: 08/28/2020 CLINICAL DATA:  Fall, hit face EXAM: CT CERVICAL SPINE WITHOUT CONTRAST TECHNIQUE: Multidetector CT imaging of the cervical spine was performed without intravenous contrast. Multiplanar CT image reconstructions were also generated. COMPARISON:  None. FINDINGS: Alignment: Normal Skull base  and vertebrae: No acute fracture. No primary bone lesion or focal pathologic process. Soft tissues and spinal canal: No prevertebral fluid or swelling. No visible canal hematoma. Disc levels:  Maintained Upper chest: No acute findings Other: None IMPRESSION: No acute bony abnormality. Electronically Signed   By: Rolm Baptise M.D.   On: 08/28/2020 17:48   DG Pelvis Portable  Result Date: 08/28/2020 CLINICAL DATA:  Pain after fall pain after fall EXAM: PORTABLE PELVIS 1-2 VIEWS COMPARISON:  None. FINDINGS: There is no evidence of pelvic fracture or diastasis. No pelvic bone lesions are seen. IMPRESSION: Negative. Electronically Signed   By: Dorise Bullion III M.D   On: 08/28/2020 16:37   DG Hand 2 View Left  Result Date: 08/28/2020 CLINICAL DATA:  Pain after fall EXAM: LEFT HAND - 2 VIEW COMPARISON:  None. FINDINGS: There is no evidence of fracture or dislocation.  There is no evidence of arthropathy or other focal bone abnormality. Soft tissues are unremarkable. IMPRESSION: Negative. Electronically Signed   By: Dorise Bullion III M.D   On: 08/28/2020 16:39   DG Chest Port 1 View  Result Date: 08/28/2020 CLINICAL DATA:  Fall, trauma. EXAM: PORTABLE CHEST 1 VIEW COMPARISON:  05/15/2019 FINDINGS: Borderline cardiomegaly is unchanged from prior exam. Implanted loop recorder projects over the left chest wall. No pneumothorax or large pleural effusion. No focal airspace opacity. Chronic interstitial coarsening is unchanged. No acute osseous abnormalities are seen. IMPRESSION: Stable borderline cardiomegaly. No acute chest findings or evidence of traumatic injury. Electronically Signed   By: Keith Rake M.D.   On: 08/28/2020 16:36   DG Knee Right Port  Result Date: 08/28/2020 CLINICAL DATA:  Fall EXAM: PORTABLE RIGHT KNEE - 1-2 VIEW COMPARISON:  None. FINDINGS: Visualization of fine bony detail limited by habitus. Alignment is anatomic. No definite joint effusion. No definite acute fracture. Marked medial joint space narrowing. Otherwise mild lateral in patellofemoral compartment osteoarthritis. IMPRESSION: 1. No definite acute fracture. 2. Marked medial joint space narrowing. Electronically Signed   By: Macy Mis M.D.   On: 08/28/2020 16:39   CT MAXILLOFACIAL WO CONTRAST  Result Date: 08/28/2020 CLINICAL DATA:  Fall, hit face.  Facial trauma EXAM: CT MAXILLOFACIAL WITHOUT CONTRAST TECHNIQUE: Multidetector CT imaging of the maxillofacial structures was performed. Multiplanar CT image reconstructions were also generated. COMPARISON:  None. FINDINGS: Osseous: No fracture or mandibular dislocation. No destructive process. Orbits: Negative. No traumatic or inflammatory finding. Sinuses: Clear Soft tissues: Soft tissue swelling over the right forehead and chin. Limited intracranial: See head CT report. IMPRESSION: No facial or orbital fracture. Electronically  Signed   By: Rolm Baptise M.D.   On: 08/28/2020 17:49    Procedures .Marland KitchenLaceration Repair  Date/Time: 08/28/2020 7:12 PM Performed by: Heavyn Yearsley, [place], MD Authorized by: Drenda Freeze, MD   Consent:    Consent obtained:  Verbal   Consent given by:  Patient   Risks, benefits, and alternatives were discussed: yes     Risks discussed:  Pain, poor wound healing and need for additional repair Universal protocol:    Patient identity confirmed:  Verbally with patient Anesthesia:    Anesthesia method:  None Laceration details:    Location:  Hand   Hand location:  L hand, dorsum   Length (cm):  8 Exploration:    Hemostasis achieved with:  Direct pressure   Imaging obtained: x-ray     Imaging outcome: foreign body not noted     Wound exploration: wound explored through full range of motion  and entire depth of wound visualized     Wound extent: no nerve damage noted and no tendon damage noted     Contaminated: no   Treatment:    Area cleansed with:  Saline and soap and water   Amount of cleaning:  Standard   Irrigation solution:  Sterile saline   Irrigation method:  Syringe   Visualized foreign bodies/material removed: no     Debridement:  None Skin repair:    Repair method:  Steri-Strips   Number of Steri-Strips:  5 Approximation:    Approximation:  Close Repair type:    Repair type:  Simple Post-procedure details:    Dressing:  Antibiotic ointment and tube gauze   Procedure completion:  Tolerated .Marland KitchenLaceration Repair  Date/Time: 08/28/2020 7:14 PM Performed by: Aleera Gilcrease, [place], MD Authorized by: Drenda Freeze, MD   Consent:    Consent obtained:  Verbal   Consent given by:  Patient   Risks, benefits, and alternatives were discussed: yes     Risks discussed:  Infection, pain and poor cosmetic result Universal protocol:    Patient identity confirmed:  Verbally with patient Anesthesia:    Anesthesia method:  None Laceration details:    Location:  Face   Face  location:  R cheek   Length (cm):  3 Exploration:    Limited defect created (wound extended): no     Hemostasis achieved with:  Direct pressure   Imaging outcome: foreign body not noted     Wound exploration: wound explored through full range of motion and entire depth of wound visualized     Wound extent: no fascia violation noted, no tendon damage noted and no vascular damage noted     Contaminated: no   Treatment:    Area cleansed with:  Saline   Irrigation solution:  Sterile saline   Irrigation method:  Syringe   Visualized foreign bodies/material removed: no     Debridement:  None   Undermining:  None Skin repair:    Repair method:  Tissue adhesive Approximation:    Approximation:  Close Repair type:    Repair type:  Simple Post-procedure details:    Dressing:  Open (no dressing)   Procedure completion:  Tolerated .Marland KitchenLaceration Repair  Date/Time: 08/28/2020 7:15 PM Performed by: Windle Huebert, [place], MD Authorized by: Drenda Freeze, MD   Consent:    Consent obtained:  Verbal   Consent given by:  Patient   Risks, benefits, and alternatives were discussed: yes     Risks discussed:  Need for additional repair, poor cosmetic result and pain   Alternatives discussed:  No treatment Universal protocol:    Patient identity confirmed:  Verbally with patient and arm band Anesthesia:    Anesthesia method:  None Laceration details:    Location:  Lip   Lip location:  Upper exterior lip   Length (cm):  1   Depth (mm):  5 Treatment:    Area cleansed with:  Saline   Amount of cleaning:  Standard   Irrigation solution:  Sterile saline   Irrigation method:  Tap   Visualized foreign bodies/material removed: no     Debridement:  Minimal   Scar revision: no   Skin repair:    Repair method:  Sutures   Suture material:  Fast-absorbing gut   Suture technique:  Simple interrupted   Number of sutures:  1 Approximation:    Approximation:  Close   Vermilion border well-aligned:  vermillion border not aligned, discussed with patient,  due to debridement and pain unable to align perfectly. She understands and opted to not have additional sutures.   Repair type:    Repair type:  Simple Post-procedure details:    Dressing:  Open (no dressing)   Procedure completion:  Tolerated   Medications Ordered in ED Medications  morphine 4 MG/ML injection 4 mg (4 mg Intravenous Not Given 08/28/20 2029)  ondansetron (ZOFRAN) injection 4 mg (4 mg Intravenous Not Given 08/28/20 2030)  Tdap (BOOSTRIX) injection 0.5 mL (0.5 mLs Intramuscular Given 08/28/20 2031)   ED Course  I have reviewed the triage vital signs and the nursing notes.  Pertinent labs & imaging results that were available during my care of the patient were reviewed by me and considered in my medical decision making (see chart for details).  Clinical Course as of 08/28/20 2317  Fri Aug 28, 2020  [employer] [JK]    Clinical Course User Index [JK] Bengie Kaucher, [place], MD   MDM Rules/Calculators/A&P                         Patient with injuries noted on exam to the head, face, left hand.  Bruising noted to the right knee.  Imaging evaluating intercranial abnormalities, C-spine abnormalities, left hand fracture or any abnormalities.  Patient without cervical spine tenderness, lumbar spine tenderness and thoracic spine tenderness.  No chest tenderness.  No bruising over the chest.  No abdominal tenderness.  Patient with elevated blood pressure.  Patient states it sometimes happens when she gets overwhelmed.  Blood pressure medication on patient's med list.  Will encourage her to follow-up with PCP for further evaluation of her hypertension.  No acute traumatic injuries noted on work-up. CT scans as above. No acute findings on CXR and pelvic XR.  Patient is alert awake and oriented. No focal neurologic deficits. Appears at her baseline. Husband at bedside able to take her home.   Lacerations  repaired as above.  We will have her follow-up with PCP this week for wound check.  Stable for discharge home at this time.  Final Clinical Impression(s) / ED Diagnoses Final diagnoses:  Fall, initial encounter  Facial laceration, initial encounter  Laceration of right hand without foreign body, initial encounter    Rx / DC Orders ED Discharge Orders    None       Lynx Goodrich, [place], MD 08/28/20 2317    Drenda Freeze, MD 08/30/20 [phone]

## 2020-08-28 NOTE — ED Notes (Signed)
Portable xrays complete.

## 2020-08-28 NOTE — ED Triage Notes (Signed)
Pt came via EMS for fall on thinners. Pt was at home and missed a step and fell on her face. Pt has a busted lip upon assessment and a hematoma on right eyebrow. Pt is axox4. Pt is taking blood thinner.

## 2020-08-28 NOTE — Discharge Instructions (Addendum)
-   Your stitch does not need to be removed, it is absorbable - Please follow up with your primary doctor this week, I would like you to have a wound check on your wounds, especially your hand - Please keep your wounds clean and dry

## 2020-08-28 NOTE — ED Notes (Signed)
Pt refused manual blood pressure.

## 2020-08-28 NOTE — ED Notes (Signed)
Phlebotomy at bedside, will obtain VS when able

## 2020-08-28 NOTE — ED Notes (Signed)
Iv team at the bedside 

## 2020-08-28 NOTE — Progress Notes (Signed)
Responded to ED page to support patient. Provided listening,  emotional and spiritual support. Will follow  as needed.   Jaclynn Major, [place], [facility], Pager [phone]

## 2020-08-28 NOTE — Progress Notes (Signed)
VAST consulted to obtain IV access. Pt's lower left arm with severe skin tear from fall earlier today. Assessed right lower arm with ultrasound after visualizing tortuous surface veins. Right lower arm with one appropriate vessel for USGIV placement. However, right lower arm is where BP's are being measured.  Distal part of upper arm has vessel appropriate for midline. Advised ED RN and MD that midlline at this time is too invasive as patient is not getting IV meds or fluids.  MD verbalized permission to leave patient without IV access at this time. RN verbalized she will place another consult if it becomes necessary for patient to have IV access.

## 2020-08-31 ENCOUNTER — Telehealth: Payer: Self-pay

## 2020-08-31 DIAGNOSIS — S0181XD Laceration without foreign body of other part of head, subsequent encounter: Secondary | ICD-10-CM | POA: Diagnosis not present

## 2020-08-31 DIAGNOSIS — S41119A Laceration without foreign body of unspecified upper arm, initial encounter: Secondary | ICD-10-CM | POA: Diagnosis not present

## 2020-08-31 DIAGNOSIS — W19XXXA Unspecified fall, initial encounter: Secondary | ICD-10-CM | POA: Diagnosis not present

## 2020-08-31 NOTE — Telephone Encounter (Signed)
Patient called in and states she was in the hospital over the weekend and she states that she was in afib and wants JA to be aware she states she saw it on the monitor and they didn't actually tell her. Patient went into the hospital originally because she had a fall. Patient will send in a remote transmission so that we can see if she is still in Afib. [facility] is down at the moment and patient aware that a nurse will look at her transmission and give her a call back if they see anything

## 2020-08-31 NOTE — Telephone Encounter (Signed)
Advised pt that transmission received, it shows she is not currently in AF.

## 2020-09-10 ENCOUNTER — Ambulatory Visit: Payer: Medicare Other | Admitting: Gastroenterology

## 2020-09-10 ENCOUNTER — Encounter: Payer: Self-pay | Admitting: Gastroenterology

## 2020-09-10 VITALS — HR 67 | Ht <= 58 in | Wt 227.0 lb

## 2020-09-10 DIAGNOSIS — R198 Other specified symptoms and signs involving the digestive system and abdomen: Secondary | ICD-10-CM | POA: Diagnosis not present

## 2020-09-10 DIAGNOSIS — K219 Gastro-esophageal reflux disease without esophagitis: Secondary | ICD-10-CM

## 2020-09-10 MED ORDER — OMEPRAZOLE 40 MG PO CPDR: 40.0000 mg | DELAYED_RELEASE_CAPSULE | Freq: Every day | ORAL | 11 refills | Status: DC

## 2020-09-10 NOTE — Patient Instructions (Signed)
Stop taking Pepcid.   We have sent the following medications to your pharmacy for you to pick up at your convenience: omeprazole.   Patient advised to avoid spicy, acidic, citrus, chocolate, mints, fruit and fruit juices.  Limit the intake of caffeine, alcohol and Soda.  Don't exercise too soon after eating.  Don't lie down within 3-4 hours of eating.  Elevate the head of your bed.  RECTAL CARE INSTRUCTIONS:  1. Sitz Baths twice a day for 10 minutes each. 2. Thoroughly clean and dry the rectum. 3. Put Tucks pad against the rectum at night. 4. Clean the rectum with Balenol lotion after each bowel movement.  Thank you for choosing me and [facility].  Pricilla Riffle. Dagoberto Ligas., MD., Marval Regal

## 2020-09-10 NOTE — Progress Notes (Signed)
History of Present Illness: This is a 75 year old female referred by [name], DO for the evaluation of alternating bowel habits and reflux symptoms.  She is accompanied by her husband.  She has multiple comorbidities as outlined below.  Since her C. difficile infection in 2019 she relates problems with alternating diarrhea and constipation.  She notes several foods that precipitate both problems.  With diet adjustments her symptoms have improved however she occasionally needs to use an over-the-counter laxative or Imodium.  She notes hemorrhoid swelling intermittently treated with hemorrhoid creams and suppositories.  She relates worsening problems with heartburn and occasional regurgitation.  Famotidine 20 mg daily has helped her symptoms however they persist.  She relates frequent right flank and right upper quadrant pain that she feels is related to prior shingles.  Unfortunately she sustained a fall on March 18 and was evaluated in the ED.  She has bruises on her face.  She has a skin tear on left forehead, under her right eye and large tear tear on her left hand.  She does not feel she can lie on her left side for rectal exam or anoscopy today. Denies weight loss, change in stool caliber, melena, hematochezia, nausea, vomiting, dysphagia, chest pain.   Normal colonoscopy in 03/2011   Allergies  Allergen Reactions  . Butoconazole Itching, Rash and Other (See Comments)    'Redness and swelling feverish ' 'worse symptoms '  . Keflex [Cephalexin] Diarrhea  . Lipitor [Atorvastatin Calcium] Other (See Comments)    'Muscle weakness'     Outpatient Medications Prior to Visit  Medication Sig Dispense Refill  . acetaminophen (TYLENOL) 500 MG tablet Take 500-1,000 mg by mouth every 6 (six) hours as needed for mild pain or fever.     Marland Kitchen alendronate (FOSAMAX) 70 MG tablet Take 1 tablet by mouth every Tuesday.     Marland Kitchen allopurinol (ZYLOPRIM) 100 MG tablet Take 100 mg by mouth 2 (two) times daily.      Marland Kitchen CALCIUM PO Take 500 mg by mouth daily.    . cetirizine (ZYRTEC) 10 MG tablet Take 10 mg by mouth daily.    . Cholecalciferol (VITAMIN D3) 2000 UNITS TABS Take 2,000 Units by mouth daily.    Marland Kitchen ELIQUIS 5 MG TABS tablet TAKE 1 TABLET BY MOUTH TWICE A DAY 60 tablet 5  . famotidine (PEPCID) 20 MG tablet Take 20 mg by mouth daily.    . folic acid (FOLVITE) 1 MG tablet Take 1 mg by mouth 4 (four) times daily.    . furosemide (LASIX) 40 MG tablet Take 1 tablet (40 mg total) by mouth 2 (two) times daily. 120 tablet 5  . levothyroxine (SYNTHROID) 88 MCG tablet Take 88 mcg by mouth daily.     Marland Kitchen losartan (COZAAR) 25 MG tablet TAKE 1 TABLET BY MOUTH  DAILY 90 tablet 2  . methotrexate (RHEUMATREX) 2.5 MG tablet Take 12.5 mg by mouth every Wednesday.   3  . metoprolol succinate (TOPROL-XL) 25 MG 24 hr tablet TAKE 1 AND 1/2 TABLETS BY  MOUTH TWICE DAILY 270 tablet 2  . Multiple Vitamin (MULTIVITAMIN) tablet Take 1 tablet by mouth daily.    Marland Kitchen OVER THE COUNTER MEDICATION Place 1 drop into both eyes daily as needed (for dry eyes. Advanced eye relief).    . phenylephrine-shark liver oil-mineral oil-petrolatum (PREPARATION H) 0.25-3-14-71.9 % rectal ointment Place 1 application rectally as needed for hemorrhoids.    . potassium chloride SA (KLOR-CON M20) 20 MEQ  tablet Take 1 tablet (20 mEq total) by mouth 3 (three) times daily. 60 tablet 11  . predniSONE (DELTASONE) 5 MG tablet Take 5 mg by mouth daily with breakfast.    . rosuvastatin (CRESTOR) 5 MG tablet Take 5 mg by mouth every other day.     . saccharomyces boulardii (FLORASTOR) 250 MG capsule Take 250 mg by mouth 2 (two) times daily.    . sotalol (BETAPACE) 80 MG tablet TAKE 1 TABLET BY MOUTH  TWICE DAILY 180 tablet 3  . vitamin B-12 (CYANOCOBALAMIN) 1000 MCG tablet Take 1,000 mcg by mouth daily.    Marland Kitchen albuterol (VENTOLIN HFA) 108 (90 Base) MCG/ACT inhaler Inhale 1-2 puffs into the lungs every 6 (six) hours as needed for wheezing or shortness of breath. 18 g  0  . dextromethorphan-guaiFENesin (MUCINEX DM) 30-600 MG 12hr tablet Take 1 tablet by mouth 2 (two) times daily. 20 tablet 0  . doxycycline (VIBRAMYCIN) 100 MG capsule Take 1 capsule (100 mg total) by mouth 2 (two) times daily. 20 capsule 0  . predniSONE (DELTASONE) 10 MG tablet Begin with 6 tabs on day 1, 5 tab on day 2, 4 tab on day 3, 3 tab on day 4, 2 tab on day 5, 1 tab on day 6-take with food 21 tablet 0   No facility-administered medications prior to visit.   Past Medical History:  Diagnosis Date  . Anemia   . Bilateral leg edema   . C. difficile enteritis   . CAD (coronary artery disease)    a. LHC 03/2006: EF 65%, mid LAD 40-50%, ostial D1 70-80%, normal circumflex, normal RCA;  b. Lexiscan Myoview 09/2010: No ischemia or scar, EF 75%;  c. Lex MV 5/14:  Normal, no ischemia, EF 71% // Myoview 08/2018: EF 43, normal perfusion; Intermediate Risk due to low EF (Echo 07/2018 with normal EF)   . Cataract    bilateral-had surgery  . Cellulitis of right leg    Hx - resolved  . Chronic diastolic CHF (congestive heart failure) (HCC)    Echo 8/14:  Mild LVH, EF 55-65%, normal wall motion, Tr AI, mild MR, mod TR, PASP 45 // 12/2014 Echo: EF 60-65%, nl wall motion, mildly dil RA/LA, PASP 37mHg.// Echo 07/2018: EF 60-65, mild LVH, mod diastolic dysfunction, normal RVSF, severe LAE, mod MAC, mild MR, mild TR   . Continuous urine leakage   . Dermatomyositis ([place]   . Deviated septum   . Essential hypertension   . Family history of adverse reaction to anesthesia    .' MY SON IS A DIFFICULT INTUBATION"  . Gall stones    a. 08/2014 Abd U/S:  Large gallstone measuring 4.2 cm.  .Marland KitchenGERD (gastroesophageal reflux disease)   . Gout   . Hiatal hernia   . History of pancreatitis   . History of PFTs    a. PFTs 10/2012: normal.   . Hyperlipidemia   . Hypothyroidism   . Internal hemorrhoids   . Morbid obesity ([place]   . Multifocal atrial tachycardia ([place] 08/10/2018   Noted during admission in 07/2018  .  Osteoarthritis    spine hips knees  . PAF (paroxysmal atrial fibrillation) (HCC)    a. CHA2DS2VASc = 5-->poor OAC candidate 2/2 falls. No problems now per patient 03/2016  . Pneumonia   . Seizures ([place]    as a baby  . Shingles    Hx  . Sleep apnea    Does not use CPAP  . Spinal cord compression ([place]  02/21/2014   Past Surgical History:  Procedure Laterality Date  . CATARACT EXTRACTION, BILATERAL Bilateral   . COLONOSCOPY  03/2011  . Implantable loop recorder placement  12/21/2018   Medtronic Reveal [place] model [id number] (SN [id number]) implanted by Dr Rayann Heman in office for AF management  . NASAL SEPTUM SURGERY    . TOTAL ABDOMINAL HYSTERECTOMY    . VERTEBROPLASTY  Sept 7 and May 30, 2014   x 2, T11/T12  . WISDOM TOOTH EXTRACTION     Social History   Socioeconomic History  . Marital status: Married    Spouse name: Not on file  . Number of children: 2  . Years of education: Not on file  . Highest education level: Not on file  Occupational History    Employer: UNEMPLOYED  Tobacco Use  . Smoking status: Never Smoker  . Smokeless tobacco: Never Used  Vaping Use  . Vaping Use: Never used  Substance and Sexual Activity  . Alcohol use: No  . Drug use: No  . Sexual activity: Not Currently    Birth control/protection: Post-menopausal  Other Topics Concern  . Not on file  Social History Narrative  . Not on file   Social Determinants of Health   Financial Resource Strain: Not on file  Food Insecurity: Not on file  Transportation Needs: Not on file  Physical Activity: Not on file  Stress: Not on file  Social Connections: Not on file   Family History  Problem Relation Age of Onset  . Colon cancer Mother        mets, spot on lungs and spine  . Coronary artery disease Father   . Emphysema Father   . Heart attack Brother        x 2  . Colon cancer Paternal Uncle   . Kidney disease Brother   . Hypertension Brother   . Stroke Neg Hx   . Esophageal cancer Neg Hx        Review of Systems: Pertinent positive and negative review of systems were noted in the above HPI section. All other review of systems were otherwise negative.    Physical Exam: General: Well developed, well nourished, elderly, no acute distress Head: Normocephalic, facial skin tears and bruising Eyes: Sclerae anicteric, EOMI Ears: Normal auditory acuity Mouth: Not examined, mask on during Covid-19 pandemic Neck: Supple, no masses or thyromegaly Lungs: Clear throughout to auscultation Heart: Regular rate and rhythm; no murmurs, rubs or bruits Abdomen: Soft, non tender and non distended. No masses, hepatosplenomegaly or hernias noted. Normal Bowel sounds Rectal: Not done as patient is unable to lie on her left side with her hand injury at this time. Musculoskeletal: Symmetrical with no gross deformities  Skin: No lesions on visible extremities Pulses:  Normal pulses noted Extremities: No clubbing, cyanosis, edema noted.  Bandage over her left hand and lower arm Neurological: Alert oriented x 4, grossly nonfocal Cervical Nodes:  No significant cervical adenopathy Inguinal Nodes: No significant inguinal adenopathy Psychological:  Alert and cooperative. Normal mood and affect   Assessment and Recommendations:  1. Alternating constipation and diarrhea.  Frequent hemorrhoid symptoms.  Advised her to return when she has had more time to recover from her injuries to proceed with rectal examination and possibly anoscopy.  Avoid foods that trigger symptoms.  Judicious use of mild OTC laxative and Imodium.  Standard rectal care instructions.  Continue Preparation H cream topically bid prn and Preparation H suppositories PR daily prn.  We discussed the possibility of colonoscopy  and she would like to avoid colonoscopy if at all possible. REV in 6 weeks.  2. GERD.  Follow standard antireflux measures.  Discontinue famotidine.  Begin omeprazole 40 mg daily. REV in 6 weeks.   3.  Afib.  Maintained  on Eliquis.  4.  Dermatomyositis.   cc: [name], DO [address] [place],  [place] [address]

## 2020-09-14 ENCOUNTER — Ambulatory Visit (INDEPENDENT_AMBULATORY_CARE_PROVIDER_SITE_OTHER): Payer: Medicare Other

## 2020-09-14 DIAGNOSIS — I480 Paroxysmal atrial fibrillation: Secondary | ICD-10-CM | POA: Diagnosis not present

## 2020-09-15 LAB — CUP PACEART REMOTE DEVICE CHECK
Date Time Interrogation Session: 20220402004732
Implantable Pulse Generator Implant Date: 20200710

## 2020-09-17 DIAGNOSIS — M1990 Unspecified osteoarthritis, unspecified site: Secondary | ICD-10-CM | POA: Diagnosis not present

## 2020-09-17 DIAGNOSIS — M150 Primary generalized (osteo)arthritis: Secondary | ICD-10-CM | POA: Diagnosis not present

## 2020-09-17 DIAGNOSIS — Z79899 Other long term (current) drug therapy: Secondary | ICD-10-CM | POA: Diagnosis not present

## 2020-09-17 DIAGNOSIS — M109 Gout, unspecified: Secondary | ICD-10-CM | POA: Diagnosis not present

## 2020-09-17 DIAGNOSIS — Z7952 Long term (current) use of systemic steroids: Secondary | ICD-10-CM | POA: Diagnosis not present

## 2020-09-17 DIAGNOSIS — M5136 Other intervertebral disc degeneration, lumbar region: Secondary | ICD-10-CM | POA: Diagnosis not present

## 2020-09-17 DIAGNOSIS — M549 Dorsalgia, unspecified: Secondary | ICD-10-CM | POA: Diagnosis not present

## 2020-09-17 DIAGNOSIS — M810 Age-related osteoporosis without current pathological fracture: Secondary | ICD-10-CM | POA: Diagnosis not present

## 2020-09-17 DIAGNOSIS — M3390 Dermatopolymyositis, unspecified, organ involvement unspecified: Secondary | ICD-10-CM | POA: Diagnosis not present

## 2020-09-21 DIAGNOSIS — M0579 Rheumatoid arthritis with rheumatoid factor of multiple sites without organ or systems involvement: Secondary | ICD-10-CM | POA: Diagnosis not present

## 2020-09-24 NOTE — Progress Notes (Signed)
[facility] Summary Report / Loop Recorder 

## 2020-09-28 ENCOUNTER — Telehealth: Payer: Self-pay | Admitting: Gastroenterology

## 2020-09-28 MED ORDER — PANTOPRAZOLE SODIUM 40 MG PO TBEC: 40.0000 mg | DELAYED_RELEASE_TABLET | Freq: Every day | ORAL | 3 refills | Status: DC

## 2020-09-28 NOTE — Telephone Encounter (Signed)
Patient notified  She requested I send it to [facility].  rx sent per her request

## 2020-09-28 NOTE — Telephone Encounter (Signed)
Pantoprazole 40 mg po qd, 1 year of refills

## 2020-09-28 NOTE — Telephone Encounter (Signed)
Patient reports she has a hx of urinary incontinence.  She is notified that omeprazole can increase urinary frequency.  She reports that she stopped her diuretics and the symptoms continued.  She tried stopping the omeprazole and resumed her Pepcid.  Her GERD symptoms were resolved with the omeprazole.  Do you want to prescribe an alternative?

## 2020-09-29 NOTE — Progress Notes (Addendum)
Cardiology Office Note Date:  09/30/2020  Patient ID:  [name], [name] [birth year], MRN [id number] PCP:  [name], DO  Electrophysiologist: Dr. Rayann Heman   Chief Complaint:  6 mo  History of Present Illness: [name] is a 75 y.o. female with history of Non-obstructive CAD (cath 2007), HTN, HLD, chronic CHF (diastolic) AFib,  sleep apnea (not using CPAP), hypothyroidism, dermatomyositis, morbid obeisty  She comes in today to be seen for Dr. Rayann Heman, last seen by him Oct 2021, mentioned not very active and chronically with some edema No changes were made, stable intervals with sotalol, referred to Dr. Ron Parker for dental appliance for her CPAP. Planned for EP APP Q24mo TODAY She is accompanied by her husband. She had a fall in March required an ER visit for sutures , CT head without intracranial bleeding/injury She reports that she was seated oputside, her husband doing something and she had to use the bathroom, just as she was steppingup to the sidewalk she stumbled and fell. She denies this as a near syncope or syncopal event. She hit her face and hand Was evaluated treated in the ER and discharged. She hasnot had other falls She does have vertigo and mentions if she turns to quickly she easily gets off balance.  She thinks she has ha a few episodes of AFib, has a  Hard time being specific on frequency or duration from a symptom perspective, tells me just to check her device and see.  She has a few complaints Mentions one of her Afib episodes gave her CP, she was at rest, lastied for "a bit", she denies CP otherwise.  No rest SOB, does have DOE she says some times "pretty bad" but thinks it is because she has been less active since her fall She has some degree of chronic DOE but thinks is worse in the last 2 months or so  AFib Hx Goes as far back as her epic chart (2010)  AAD hx sotalol very remotely stopped with recurrent AFib Amiodarone 2014 > tremors and stopped fairly  quickly Sotalol resumed July 2020  Device information MDT ILR implanted 12/21/2018 for afib surveillance/management   Past Medical History:  Diagnosis Date  . Anemia   . Bilateral leg edema   . C. difficile enteritis   . CAD (coronary artery disease)    a. LHC 03/2006: EF 65%, mid LAD 40-50%, ostial D1 70-80%, normal circumflex, normal RCA;  b. Lexiscan Myoview 09/2010: No ischemia or scar, EF 75%;  c. Lex MV 5/14:  Normal, no ischemia, EF 71% // Myoview 08/2018: EF 43, normal perfusion; Intermediate Risk due to low EF (Echo 07/2018 with normal EF)   . Cataract    bilateral-had surgery  . Cellulitis of right leg    Hx - resolved  . Chronic diastolic CHF (congestive heart failure) (HCC)    Echo 8/14:  Mild LVH, EF 55-65%, normal wall motion, Tr AI, mild MR, mod TR, PASP 45 // 12/2014 Echo: EF 60-65%, nl wall motion, mildly dil RA/LA, PASP 421mg.// Echo 07/2018: EF 60-65, mild LVH, mod diastolic dysfunction, normal RVSF, severe LAE, mod MAC, mild MR, mild TR   . Continuous urine leakage   . Dermatomyositis ([place]CL[place]  . Deviated septum   . Essential hypertension   . Family history of adverse reaction to anesthesia    .' MY SON IS A DIFFICULT INTUBATION"  . Gall stones    a. 08/2014 Abd U/S:  Large gallstone measuring 4.2 cm.  .Marland Kitchen  GERD (gastroesophageal reflux disease)   . Gout   . Hiatal hernia   . History of pancreatitis   . History of PFTs    a. PFTs 10/2012: normal.   . Hyperlipidemia   . Hypothyroidism   . Internal hemorrhoids   . Morbid obesity ([place])   . Multifocal atrial tachycardia ([place]) 08/10/2018   Noted during admission in 07/2018  . Osteoarthritis    spine hips knees  . PAF (paroxysmal atrial fibrillation) (HCC)    a. CHA2DS2VASc = 5-->poor OAC candidate 2/2 falls. No problems now per patient 03/2016  . Pneumonia   . Seizures ([place])    as a baby  . Shingles    Hx  . Sleep apnea    Does not use CPAP  . Spinal cord compression ([place]) 02/21/2014    Past Surgical History:   Procedure Laterality Date  . CATARACT EXTRACTION, BILATERAL Bilateral   . COLONOSCOPY  03/2011  . Implantable loop recorder placement  12/21/2018   Medtronic Reveal [place] model [id number] (SN [id number]) implanted by Dr Rayann Heman in office for AF management  . NASAL SEPTUM SURGERY    . TOTAL ABDOMINAL HYSTERECTOMY    . VERTEBROPLASTY  Sept 7 and May 30, 2014   x 2, T11/T12  . WISDOM TOOTH EXTRACTION      Current Outpatient Medications  Medication Sig Dispense Refill  . acetaminophen (TYLENOL) 500 MG tablet Take 500-1,000 mg by mouth every 6 (six) hours as needed for mild pain or fever.     Marland Kitchen alendronate (FOSAMAX) 70 MG tablet Take 1 tablet by mouth every Tuesday.     Marland Kitchen allopurinol (ZYLOPRIM) 100 MG tablet Take 100 mg by mouth 2 (two) times daily.     Marland Kitchen CALCIUM PO Take 500 mg by mouth daily.    . cetirizine (ZYRTEC) 10 MG tablet Take 10 mg by mouth daily.    . Cholecalciferol (VITAMIN D3) 2000 UNITS TABS Take 2,000 Units by mouth daily.    Marland Kitchen ELIQUIS 5 MG TABS tablet TAKE 1 TABLET BY MOUTH TWICE A DAY 60 tablet 5  . famotidine (PEPCID) 20 MG tablet Take 20 mg by mouth daily.    . folic acid (FOLVITE) 1 MG tablet Take 1 mg by mouth 4 (four) times daily.    . furosemide (LASIX) 40 MG tablet Take 1 tablet (40 mg total) by mouth 2 (two) times daily. 120 tablet 5  . levothyroxine (SYNTHROID) 88 MCG tablet Take 88 mcg by mouth daily.     Marland Kitchen losartan (COZAAR) 25 MG tablet TAKE 1 TABLET BY MOUTH  DAILY 90 tablet 2  . methotrexate (RHEUMATREX) 2.5 MG tablet Take 12.5 mg by mouth every Wednesday.   3  . metoprolol succinate (TOPROL-XL) 25 MG 24 hr tablet TAKE 1 AND 1/2 TABLETS BY  MOUTH TWICE DAILY 270 tablet 2  . Multiple Vitamin (MULTIVITAMIN) tablet Take 1 tablet by mouth daily.    Marland Kitchen OVER THE COUNTER MEDICATION Place 1 drop into both eyes daily as needed (for dry eyes. Advanced eye relief).    . pantoprazole (PROTONIX) 40 MG tablet Take 1 tablet (40 mg total) by mouth daily. 90 tablet 3  .  phenylephrine-shark liver oil-mineral oil-petrolatum (PREPARATION H) 0.25-3-14-71.9 % rectal ointment Place 1 application rectally as needed for hemorrhoids.    . potassium chloride SA (KLOR-CON M20) 20 MEQ tablet Take 1 tablet (20 mEq total) by mouth 3 (three) times daily. 60 tablet 11  . predniSONE (DELTASONE) 5 MG tablet Take 5 mg  by mouth daily with breakfast.    . rosuvastatin (CRESTOR) 5 MG tablet Take 5 mg by mouth every other day.     . saccharomyces boulardii (FLORASTOR) 250 MG capsule Take 250 mg by mouth 2 (two) times daily.    . sotalol (BETAPACE) 80 MG tablet TAKE 1 TABLET BY MOUTH  TWICE DAILY 180 tablet 3  . vitamin B-12 (CYANOCOBALAMIN) 1000 MCG tablet Take 1,000 mcg by mouth daily.     No current facility-administered medications for this visit.    Allergies:   Butoconazole, Keflex [cephalexin], and Lipitor [atorvastatin calcium]   Social History:  The patient  reports that she has never smoked. She has never used smokeless tobacco. She reports that she does not drink alcohol and does not use drugs.   Family History:  The patient's family history includes Colon cancer in her mother and paternal uncle; Coronary artery disease in her father; Emphysema in her father; Heart attack in her brother; Hypertension in her brother; Kidney disease in her brother.  ROS:  Please see the history of present illness.    All other systems are reviewed and otherwise negative.   PHYSICAL EXAM:  VS:  BP 120/70   Pulse (!) 59   Ht _0  (1.422 m)   Wt 226 lb 6.4 oz (102.7 kg)   SpO2 93%   BMI 50.76 kg/m  BMI: Body mass index is 50.76 kg/m.  Recheck on O2 sat is 98% Well nourished, well developed, in no acute distress HEENT: normocephalic, atraumatic Neck: no JVD, carotid bruits or masses Cardiac:  RRR; no significant murmurs, no rubs, or gallops Lungs:  CTA b/l, no wheezing, rhonchi or rales Abd: soft, nontender, obese MS: no deformity or atrophy Ext: + edemqa, a component of more of  a brawny type edema, minimal pitting Skin: warm and dry, no rash Neuro:  No gross deficits appreciated Psych: euthymic mood, full affect  ILR site is stable, no tethering or discomfort   EKG:  Done today and reviewed by myself shows  SB 59bpm, no acute changed from last  Device interrogation done today and reviewed by myself:  Battery is good R waves 0.42m 4 AF episodes 2 since her last transmission 6 and 8 minutes duration, both rate controlled <1% burden   07/26/2018: TTE IMPRESSIONS  1. The left ventricle has normal systolic function with an ejection  fraction of 60-65%. The cavity size was normal. There is mildly increased  left ventricular wall thickness. Left ventricular diastolic Doppler  parameters are consistent with  pseudonormalization.  2. The right ventricle has normal systolic function. The cavity was  normal. There is no increase in right ventricular wall thickness.  3. Left atrial size was severely dilated.  4. The mitral valve is normal in structure. There is mild thickening.  There is moderate mitral annular calcification present.  5. The tricuspid valve is normal in structure.  6. The aortic valve is tricuspid There is mild thickening of the aortic  valve.  7. The pulmonic valve was normal in structure.  8. Normal LV systolic function; mild LVH; moderate diastolic dysfunction;  moderate LAE; mild MR and TR.  9. Right atrial pressure is estimated at 3 mmHg.     08/17/2018; stress myoview  Nuclear stress EF: 43%.  The study is normal.  This is an intermediate risk study.   Normal perfusion. LVEF 43% with global hypokinesis. Clinical correlation with echo is advised as LVEF may be falsely low due to frequent PAC's and  PVC's. This is an intermediate risk study.   Recent Labs: 01/23/2020: Magnesium 2.0 08/28/2020: BUN 22; Creatinine, Ser 0.80; Hemoglobin 13.3; Platelets 133; Potassium 4.2; Sodium 142  No results found for requested labs  within last 8760 hours.   CrCl cannot be calculated (Patient's most recent lab result is older than the maximum 21 days allowed.).   Wt Readings from Last 3 Encounters:  09/30/20 226 lb 6.4 oz (102.7 kg)  09/10/20 227 lb (103 kg)  03/27/20 216 lb (98 kg)     Other studies reviewed: Additional studies/records reviewed today include: summarized above  ASSESSMENT AND PLAN:  1. Paroxysmal Afib     CHA2DS2Vasc is 4, on Eliquis, appropriately dosed     <1% % burden     Sotalol with stable QT     Mag today  2. HTN     Looks good  3. Sleep apnea     She says no one called her to go or make an appt with Dr. Ron Parker     I have asked my MA to look into this process for her and follow  Up   4. SOB?  I suspect more of a chronic issue     Edema, she reports self stopping lasix a week or 2 ago, because of urinary urgency and incontinence     This she decided was her "GERD pill" and her GI is going to try something els     She will resume her lasix, she has been taking her K+ without the lasix     BMEt today     She expresses concern of the SOB/DOE, likely multifactorial with obesity, sedentary life style and stopping her lasix  Will update her echo  Disposition: F/u with remotes as usual, I will have her back in a couple months to follow up on med compliance, volume, echo.  Current medicines are reviewed at length with the patient today.  The patient did not have any concerns regarding medicines.  Venetia Night, PA-C 09/30/2020 2:22 PM       ADDEND: 10/27/20: Dr. Ron Parker is requesting an updated sleep study prior to evaluating the patient.  I have ordered a new study for her to be done. Tommye Standard, PA-C   [place] [address] [phone] (office)  [phone] (fax)

## 2020-09-30 ENCOUNTER — Ambulatory Visit: Payer: Medicare Other | Admitting: Physician Assistant

## 2020-09-30 ENCOUNTER — Other Ambulatory Visit: Payer: Self-pay

## 2020-09-30 ENCOUNTER — Encounter: Payer: Self-pay | Admitting: Physician Assistant

## 2020-09-30 VITALS — BP 120/70 | HR 59 | Ht <= 58 in | Wt 226.4 lb

## 2020-09-30 DIAGNOSIS — I5032 Chronic diastolic (congestive) heart failure: Secondary | ICD-10-CM | POA: Diagnosis not present

## 2020-09-30 DIAGNOSIS — Z79899 Other long term (current) drug therapy: Secondary | ICD-10-CM | POA: Diagnosis not present

## 2020-09-30 DIAGNOSIS — G4733 Obstructive sleep apnea (adult) (pediatric): Secondary | ICD-10-CM

## 2020-09-30 DIAGNOSIS — I480 Paroxysmal atrial fibrillation: Secondary | ICD-10-CM

## 2020-09-30 DIAGNOSIS — I10 Essential (primary) hypertension: Secondary | ICD-10-CM | POA: Diagnosis not present

## 2020-09-30 DIAGNOSIS — R0600 Dyspnea, unspecified: Secondary | ICD-10-CM

## 2020-09-30 DIAGNOSIS — Z4509 Encounter for adjustment and management of other cardiac device: Secondary | ICD-10-CM | POA: Diagnosis not present

## 2020-09-30 LAB — CUP PACEART INCLINIC DEVICE CHECK
Date Time Interrogation Session: 20220420143807
Implantable Pulse Generator Implant Date: 20200710

## 2020-09-30 LAB — BASIC METABOLIC PANEL WITH GFR
BUN/Creatinine Ratio: 20 (ref 12–28)
BUN: 17 mg/dL (ref 8–27)
CO2: 27 mmol/L (ref 20–29)
Calcium: 9.7 mg/dL (ref 8.7–10.3)
Chloride: 103 mmol/L (ref 96–106)
Creatinine, Ser: 0.87 mg/dL (ref 0.57–1.00)
Glucose: 84 mg/dL (ref 65–99)
Potassium: 4.4 mmol/L (ref 3.5–5.2)
Sodium: 145 mmol/L — ABNORMAL HIGH (ref 134–144)
eGFR: 70 mL/min/1.73

## 2020-09-30 LAB — MAGNESIUM: Magnesium: 2 mg/dL (ref 1.6–2.3)

## 2020-09-30 NOTE — Patient Instructions (Addendum)
Medication Instructions:   Your physician recommends that you continue on your current medications as directed. Please refer to the Current Medication list given to you today.  *If you need a refill on your cardiac medications before your next appointment, please call your pharmacy*   Lab Work: [place]    If you have labs (blood work) drawn today and your tests are completely normal, you will receive your results only by: Marland Kitchen MyChart Message (if you have MyChart) OR . A paper copy in the mail If you have any lab test that is abnormal or we need to change your treatment, we will call you to review the results.   Testing/Procedures: Your physician has requested that you have an echocardiogram. Echocardiography is a painless test that uses sound waves to create images of your heart. It provides your doctor with information about the size and shape of your heart and how well your heart's chambers and valves are working. This procedure takes approximately one hour. There are no restrictions for this procedure.    Follow-Up: At [facility], you and your health needs are our priority.  As part of our continuing mission to provide you with exceptional heart care, we have created designated Provider Care Teams.  These Care Teams include your primary Cardiologist (physician) and Advanced Practice Providers (APPs -  Physician Assistants and Nurse Practitioners) who all work together to provide you with the care you need, when you need it.  We recommend signing up for the patient portal called "MyChart".  Sign up information is provided on this After Visit Summary.  MyChart is used to connect with patients for Virtual Visits (Telemedicine).  Patients are able to view lab/test results, encounter notes, upcoming appointments, etc.  Non-urgent messages can be sent to your provider as well.   To learn more about what you can do with MyChart, go to [email or web address].    Your next  appointment:   6 -8 WEEKS   The format for your next appointment:   In Person  Provider:   You may see [name], MD or one of the following Advanced Practice Providers on your designated Care Team:    Tommye Standard, PA-C  ( CONTACT [place] WITH SCHEDULING ISSUES)    Other Instructions  PLEASE CONTACT DENTIST OFFICE FOR APPOINTMENT [phone] DR KATZ  OFFICE   AND THEY CAN REQUEST RECORDS FROM OFFICE  THAT'S NEEDED

## 2020-10-02 ENCOUNTER — Other Ambulatory Visit: Payer: Self-pay | Admitting: *Deleted

## 2020-10-02 DIAGNOSIS — Z79899 Other long term (current) drug therapy: Secondary | ICD-10-CM

## 2020-10-02 NOTE — Progress Notes (Signed)
LAB ORDER

## 2020-10-12 ENCOUNTER — Other Ambulatory Visit: Payer: Self-pay | Admitting: *Deleted

## 2020-10-12 DIAGNOSIS — G4733 Obstructive sleep apnea (adult) (pediatric): Secondary | ICD-10-CM

## 2020-10-12 NOTE — Progress Notes (Signed)
Doctor Ron Parker office requesting referral. Patient trying to make appointment. Reviewed Renee and Dr. Jackalyn Lombard last [place] both speak of Dr. Ron Parker for OSA. Sent in new referral.

## 2020-10-14 ENCOUNTER — Ambulatory Visit: Payer: Medicare Other | Admitting: Gastroenterology

## 2020-10-15 LAB — CUP PACEART REMOTE DEVICE CHECK
Date Time Interrogation Session: 20220505005136
Implantable Pulse Generator Implant Date: 20200710

## 2020-10-16 ENCOUNTER — Telehealth: Payer: Self-pay | Admitting: Internal Medicine

## 2020-10-16 ENCOUNTER — Other Ambulatory Visit: Payer: Medicare Other | Admitting: *Deleted

## 2020-10-16 ENCOUNTER — Other Ambulatory Visit: Payer: Self-pay

## 2020-10-16 DIAGNOSIS — Z79899 Other long term (current) drug therapy: Secondary | ICD-10-CM | POA: Diagnosis not present

## 2020-10-16 LAB — BASIC METABOLIC PANEL
BUN/Creatinine Ratio: 28 (ref 12–28)
BUN: 32 mg/dL — ABNORMAL HIGH (ref 8–27)
CO2: 27 mmol/L (ref 20–29)
Calcium: 9.9 mg/dL (ref 8.7–10.3)
Chloride: 101 mmol/L (ref 96–106)
Creatinine, Ser: 1.14 mg/dL — ABNORMAL HIGH (ref 0.57–1.00)
Glucose: 85 mg/dL (ref 65–99)
Potassium: 4 mmol/L (ref 3.5–5.2)
Sodium: 143 mmol/L (ref 134–144)
eGFR: 51 mL/min/{1.73_m2} — ABNORMAL LOW (ref >59)

## 2020-10-16 NOTE — Telephone Encounter (Signed)
[name] from Dr. Laverle Hobby office states she faxed a blank prescription for oral appliance for obstructive sleep apnea on 4/22. She states the fax she received did not have the information she requested and had a lot of blank pages, patient's insurance and what medications she's taking. She states she will fax it over again. She states she needs a sleep study and clinical notes referencing her OSA. She would like a call back that the fax was she is sending was received. She states they are there until 12:00 pm today. Phone: [phone]

## 2020-10-19 ENCOUNTER — Ambulatory Visit (INDEPENDENT_AMBULATORY_CARE_PROVIDER_SITE_OTHER): Payer: Medicare Other

## 2020-10-19 DIAGNOSIS — I480 Paroxysmal atrial fibrillation: Secondary | ICD-10-CM

## 2020-10-20 NOTE — Telephone Encounter (Signed)
Due to the fact that a sleep study can not be found for the patient I reached out to the patient to ask if she had ever had a sleep study done and she replied 20 years ago. All paperwork has been sent to dr Ron Parker but the patient will need to have another sleep study before she can move forward with an oral appliance with dr Ron Parker. Dr Ron Parker office is aware we will fax over the sleep study once it is done.

## 2020-10-21 NOTE — Telephone Encounter (Signed)
Confirmed with Dr. Kae Heller office that the order that was faxed to [place] will be sent after sleep study completed. At that time all remaining paperwork will be sent to Dr. Ron Parker office. Gave paperwork to Sydell Axon until that time.

## 2020-10-26 ENCOUNTER — Telehealth: Payer: Self-pay | Admitting: *Deleted

## 2020-10-26 DIAGNOSIS — G4733 Obstructive sleep apnea (adult) (pediatric): Secondary | ICD-10-CM

## 2020-10-26 NOTE — Telephone Encounter (Signed)
-----   Message from Tina E Tourville, RN sent at 10/26/2020  8:36 AM EDT ----- Regarding: spilt night Sleep study has been ordered for OSA.   Thank you Tina   

## 2020-10-26 NOTE — Telephone Encounter (Signed)
Spoke to the patient about sleep study that is needed for Dr. Ron Parker office. Patient agreeable and verbalized understanding. Placed order and sleep evaluation in last office note.

## 2020-10-26 NOTE — Addendum Note (Signed)
Addended by: Darrell Jewel on: 10/26/2020 08:32 AM   Modules accepted: Orders

## 2020-10-27 ENCOUNTER — Ambulatory Visit: Payer: Medicare Other | Admitting: Gastroenterology

## 2020-10-27 ENCOUNTER — Encounter: Payer: Self-pay | Admitting: Gastroenterology

## 2020-10-27 VITALS — HR 72 | Ht <= 58 in | Wt 214.0 lb

## 2020-10-27 DIAGNOSIS — K648 Other hemorrhoids: Secondary | ICD-10-CM

## 2020-10-27 DIAGNOSIS — L309 Dermatitis, unspecified: Secondary | ICD-10-CM | POA: Diagnosis not present

## 2020-10-27 DIAGNOSIS — K921 Melena: Secondary | ICD-10-CM | POA: Diagnosis not present

## 2020-10-27 DIAGNOSIS — R198 Other specified symptoms and signs involving the digestive system and abdomen: Secondary | ICD-10-CM | POA: Diagnosis not present

## 2020-10-27 MED ORDER — DICYCLOMINE HCL 10 MG PO CAPS: 10.0000 mg | ORAL_CAPSULE | Freq: Three times a day (TID) | ORAL | 11 refills | Status: DC

## 2020-10-27 MED ORDER — DICYCLOMINE HCL 10 MG PO CAPS: 10.0000 mg | ORAL_CAPSULE | Freq: Three times a day (TID) | ORAL | 3 refills | Status: DC

## 2020-10-27 NOTE — Progress Notes (Signed)
    History of Present Illness: This is a 75 year old female with alternating diarrhea, constipation and intermittent rectal bleeding. She is accompanied by her husband. She relates that omeprazole and pantoprazole caused an increase in diarrhea so they were discontinued. Rectal bleeding worsens when she has diarrhea for a few days.  She notes generalized abdominal discomfort.  Her reflux symptoms are under good but not excellent control on famotidine.  Current Medications, Allergies, Past Medical History, Past Surgical History, Family History and Social History were reviewed in [employer] record.   Physical Exam: General: Well developed, well nourished, no acute distress Head: Normocephalic and atraumatic Eyes: Sclerae anicteric, EOMI Ears: Normal auditory acuity Mouth: Not examined, mask on during Covid-19 pandemic Lungs: Clear throughout to auscultation Heart: Regular rate and rhythm; no murmurs, rubs or bruits Abdomen: Soft, generalized mild tenderness and non distended. No masses, hepatosplenomegaly or hernias noted. Normal Bowel sounds Rectal: Perianal erythema, no tenderness, no other lesions.  Musculoskeletal: Symmetrical with no gross deformities  Pulses:  Normal pulses noted Extremities: No clubbing, cyanosis, edema or deformities noted Neurological: Alert oriented x 4, grossly nonfocal Psychological:  Alert and cooperative. Normal mood and affect  Anoscopy: Grade I internal hemorrhoids, no other lesions.  Assessment and Recommendations:  1. Suspected IBS with alternating diarrhea and constipation.  Avoid foods and beverages that trigger symptoms.  She relates that both omeprazole and pantoprazole worsen diarrhea.  Trial of Tums as needed instead of Gaviscon. Begin dicyclomine 10 mg 3 times daily before meals and at bedtime. REV in 2 months.   2. Internal hemorrhoids, likely cause of bleeding.  She is not yet fit for colonoscopy however if her bleeding  persists will need to reassess for colonoscopy.  She has had difficulty using suppositories and she will attempt again to use suppositories daily when hemorrhoidal symptoms flare.  3. Perianal dermatitis.  Lotrimin AF cream to perianal area 3 times daily for 7 days.  4. GERD.  Continue famotidine 20 mg daily and follow antireflux measures.  If symptoms not well controlled will increase amount and frequency of famotidine.  5.  A. fib maintained on Eliquis.  6.  Dermatomyositis.  7. Morbid obesity, BMI = 48.

## 2020-10-27 NOTE — Patient Instructions (Signed)
Please start over the counter Lotrimin AF cream three times a day to perianal area x 7 days.   Please insert the suppositories you have at home on a more regular basis to help with symptoms.   We have sent the following medications to your pharmacy for you to pick up at your convenience: dicyclomine.    Thank you for choosing me and [facility].  Pricilla Riffle. Dagoberto Ligas., MD., Marval Regal

## 2020-10-29 ENCOUNTER — Telehealth: Payer: Self-pay | Admitting: *Deleted

## 2020-10-29 NOTE — Telephone Encounter (Signed)
-----   Message from Darrell Jewel, RN sent at 10/26/2020  8:36 AM EDT ----- Regarding: spilt night Sleep study has been ordered for OSA.   Thank you Otila Kluver

## 2020-10-29 NOTE — Telephone Encounter (Signed)
Patient notified of sleep study appointment details. Per [employer] no PA is required. Decision [id number].

## 2020-11-02 ENCOUNTER — Other Ambulatory Visit: Payer: Self-pay

## 2020-11-02 ENCOUNTER — Ambulatory Visit (HOSPITAL_COMMUNITY): Payer: Medicare Other | Attending: Cardiovascular Disease

## 2020-11-02 DIAGNOSIS — R0600 Dyspnea, unspecified: Secondary | ICD-10-CM | POA: Diagnosis not present

## 2020-11-02 LAB — ECHOCARDIOGRAM COMPLETE
Area-P 1/2: 4.29 cm2
S' Lateral: 2.6 cm

## 2020-11-09 NOTE — Progress Notes (Addendum)
Cardiology Office Note Date:  11/09/2020  Patient ID:  [name], [name] 02-10-1946, MRN [id number] PCP:  [name], DO  Electrophysiologist: Dr. Rayann Heman   Chief Complaint:  6 mo  History of Present Illness: [name] is a 75 y.o. female with history of Non-obstructive CAD (cath 2007), HTN, HLD, chronic CHF (diastolic) AFib,  sleep apnea (not using CPAP), hypothyroidism, dermatomyositis, morbid obeisty  She comes in today to be seen for Dr. Rayann Heman, last seen by him Oct 2021, mentioned not very active and chronically with some edema No changes were made, stable intervals with sotalol, referred to Dr. Ron Parker for dental appliance for her CPAP. Planned for EP APP Q91mo I saw her 09/30/20 She is accompanied by her husband. She had a fall in March required an ER visit for sutures , CT head without intracranial bleeding/injury She reports that she was seated oputside, her husband doing something and she had to use the bathroom, just as she was steppingup to the sidewalk she stumbled and fell. She denies this as a near syncope or syncopal event. She hit her face and hand Was evaluated treated in the ER and discharged. She hasnot had other falls She does have vertigo and mentions if she turns to quickly she easily gets off balance. She thinks she has had a few episodes of AFib, has a  Hard time being specific on frequency or duration from a symptom perspective, tells me just to check her device and see. She has a few complaints Mentions one of her Afib episodes gave her CP, she was at rest, lasted for "a bit", she denies CP otherwise. No rest SOB, does have DOE she says some times "pretty bad" but thinks it is because she has been less active since her fall She has some degree of chronic DOE but thinks is worse in the last 2 months or so  SOB/DOE suspect to be multifactorial Planned to resume her lasix that she self stopped 2/2 urinary frequency, update her echo and sleep study. LVEF  60-65%, no WMA, normal diastolic parameters. LA/RA mildly dilated, mild MR, mild-mod TR  TODAY Again accompanied by her husband. She has not yet done sleep test, this is being required by Dr. KRon Parkerprior to consult for oral device for her apnea. She denies CP (has a lot of heart burn), no palpitations, does not think she ha shad any AF. No near syncope or syncope.  She has had a near fall, was in the bathroom and when she turned to leave says her shoe slipped off and she lost her balance, falling back against the wall, but not falling No dizziness or symptoms associated with this  She is having a lot of GI issues, the GERD is so bad that it rises all the way to the back of her throat/mouth and the heart burn at ties quite painful.  She has a hiatal hernia and now striggling with IBS mostly diarrhea and following with GI Trying some meds but of yet not much help.  She has a small amount of blood with BM and GI told her likely from hemorrhoid    AFib Hx Goes as far back as her epic chart (2010)  AAD hx sotalol very remotely stopped with recurrent AFib Amiodarone 2014 > tremors and stopped fairly quickly Sotalol resumed July 2020  Device information MDT ILR implanted 12/21/2018 for afib surveillance/management   Past Medical History:  Diagnosis Date  . Anemia   . Bilateral leg  edema   . C. difficile enteritis   . CAD (coronary artery disease)    a. LHC 03/2006: EF 65%, mid LAD 40-50%, ostial D1 70-80%, normal circumflex, normal RCA;  b. Lexiscan Myoview 09/2010: No ischemia or scar, EF 75%;  c. Lex MV 5/14:  Normal, no ischemia, EF 71% // Myoview 08/2018: EF 43, normal perfusion; Intermediate Risk due to low EF (Echo 07/2018 with normal EF)   . Cataract    bilateral-had surgery  . Cellulitis of right leg    Hx - resolved  . Chronic diastolic CHF (congestive heart failure) (HCC)    Echo 8/14:  Mild LVH, EF 55-65%, normal wall motion, Tr AI, mild MR, mod TR, PASP 45 // 12/2014 Echo:  EF 60-65%, nl wall motion, mildly dil RA/LA, PASP 68mHg.// Echo 07/2018: EF 60-65, mild LVH, mod diastolic dysfunction, normal RVSF, severe LAE, mod MAC, mild MR, mild TR   . Continuous urine leakage   . [place]atomyositis ([place]   . Deviated septum   . Essential hypertension   . Family history of adverse reaction to anesthesia    .' MY SON IS A DIFFICULT INTUBATION"  . Gall stones    a. 08/2014 Abd U/S:  Large gallstone measuring 4.2 cm.  .Marland KitchenGERD (gastroesophageal reflux disease)   . Gout   . Hiatal hernia   . History of pancreatitis   . History of PFTs    a. PFTs 10/2012: normal.   . Hyperlipidemia   . Hypothyroidism   . Internal hemorrhoids   . Morbid obesity ([place]   . Multifocal atrial tachycardia ([place] 08/10/2018   Noted during admission in 07/2018  . Osteoarthritis    spine hips knees  . PAF (paroxysmal atrial fibrillation) (HCC)    a. CHA2DS2VASc = 5-->poor OAC candidate 2/2 falls. No problems now per patient 03/2016  . Pneumonia   . Seizures ([place]    as a baby  . Shingles    Hx  . Sleep apnea    Does not use CPAP  . Spinal cord compression ([place] 02/21/2014    Past Surgical History:  Procedure Laterality Date  . CATARACT EXTRACTION, BILATERAL Bilateral   . COLONOSCOPY  03/2011  . Implantable loop recorder placement  12/21/2018   Medtronic Reveal LWoodlandmodel [id number](SN [id number]) implanted by Dr ARayann Hemanin office for AF management  . NASAL SEPTUM SURGERY    . TOTAL ABDOMINAL HYSTERECTOMY    . VERTEBROPLASTY  Sept 7 and May 30, 2014   x 2, T11/T12  . WISDOM TOOTH EXTRACTION      Current Outpatient Medications  Medication Sig Dispense Refill  . acetaminophen (TYLENOL) 500 MG tablet Take 500-1,000 mg by mouth every 6 (six) hours as needed for mild pain or fever.     .Marland Kitchenalendronate (FOSAMAX) 70 MG tablet Take 1 tablet by mouth every Tuesday.     .Marland Kitchenallopurinol (ZYLOPRIM) 100 MG tablet Take 100 mg by mouth 2 (two) times daily.     .Marland Kitchenaluminum hydroxide-magnesium carbonate  (GAVISCON) 95-358 MG/15ML SUSP Take 15 mLs by mouth as needed.    .Marland KitchenCALCIUM PO Take 500 mg by mouth daily.    . cetirizine (ZYRTEC) 10 MG tablet Take 10 mg by mouth daily.    . Cholecalciferol (VITAMIN D3) 2000 UNITS TABS Take 2,000 Units by mouth daily.    .Marland Kitchendicyclomine (BENTYL) 10 MG capsule Take 1 capsule (10 mg total) by mouth 4 (four) times daily -  before meals and at bedtime. 3[place]  capsule 3  . ELIQUIS 5 MG TABS tablet TAKE 1 TABLET BY MOUTH TWICE A DAY 60 tablet 5  . famotidine (PEPCID) 20 MG tablet Take 20 mg by mouth daily.    . folic acid (FOLVITE) 1 MG tablet Take 1 mg by mouth 4 (four) times daily.    . furosemide (LASIX) 40 MG tablet Take 1 tablet (40 mg total) by mouth 2 (two) times daily. 120 tablet 5  . levothyroxine (SYNTHROID) 88 MCG tablet Take 88 mcg by mouth daily.     Marland Kitchen losartan (COZAAR) 25 MG tablet TAKE 1 TABLET BY MOUTH  DAILY 90 tablet 2  . methotrexate (RHEUMATREX) 2.5 MG tablet Take 12.5 mg by mouth every Wednesday.   3  . metoprolol succinate (TOPROL-XL) 25 MG 24 hr tablet TAKE 1 AND 1/2 TABLETS BY  MOUTH TWICE DAILY 270 tablet 2  . Multiple Vitamin (MULTIVITAMIN) tablet Take 1 tablet by mouth daily.    Marland Kitchen OVER THE COUNTER MEDICATION Place 1 drop into both eyes daily as needed (for dry eyes. Advanced eye relief).    . phenylephrine-shark liver oil-mineral oil-petrolatum (PREPARATION H) 0.25-3-14-71.9 % rectal ointment Place 1 application rectally as needed for hemorrhoids.    . potassium chloride SA (KLOR-CON M20) 20 MEQ tablet Take 1 tablet (20 mEq total) by mouth 3 (three) times daily. 60 tablet 11  . predniSONE (DELTASONE) 5 MG tablet Take 5 mg by mouth daily with breakfast.    . rosuvastatin (CRESTOR) 5 MG tablet Take 5 mg by mouth every other day.     . saccharomyces boulardii (FLORASTOR) 250 MG capsule Take 250 mg by mouth 2 (two) times daily.    . sotalol (BETAPACE) 80 MG tablet TAKE 1 TABLET BY MOUTH  TWICE DAILY 180 tablet 3  . vitamin B-12 (CYANOCOBALAMIN)  1000 MCG tablet Take 1,000 mcg by mouth daily.     No current facility-administered medications for this visit.    Allergies:   Butoconazole, Keflex [cephalexin], and Lipitor [atorvastatin calcium]   Social History:  The patient  reports that she has never smoked. She has never used smokeless tobacco. She reports that she does not drink alcohol and does not use drugs.   Family History:  The patient's family history includes Colon cancer in her mother and paternal uncle; Coronary artery disease in her father; Emphysema in her father; Heart attack in her brother; Hypertension in her brother; Kidney disease in her brother.  ROS:  Please see the history of present illness.    All other systems are reviewed and otherwise negative.   PHYSICAL EXAM:  VS:  There were no vitals taken for this visit. BMI: There is no height or weight on file to calculate BMI.  Recheck on O2 sat is 98% Well nourished, well developed, in no acute distress HEENT: normocephalic, atraumatic Neck: no JVD, carotid bruits or masses Cardiac:  RRR; no significant murmurs, no rubs, or gallops Lungs:  CTA b/l, no wheezing, rhonchi or rales Abd: soft, nontender, morbidly obese MS: no deformity or atrophy Ext: + edema, a component of more of a brawny type edema, minimal pitting if any Skin: warm and dry, no rash Neuro:  No gross deficits appreciated Psych: euthymic mood, full affect  ILR site is stable, no tethering or discomfort   EKG:   Done today and reviewed by myself SR 60bpm, PACs, manually measured QT is 480m, QTc 4480m Device interrogation done today and reviewed by myself:  Battery is good No AFib since  last visit  11/02/2020: TTE IMPRESSIONS  1. Left ventricular ejection fraction, by estimation, is 60 to 65%. The  left ventricle has normal function. The left ventricle has no regional  wall motion abnormalities. Left ventricular diastolic parameters were  normal.  2. Right ventricular systolic  function is normal. The right ventricular  size is normal.  3. Left atrial size was mildly dilated.  4. Right atrial size was mildly dilated.  5. The mitral valve is degenerative. Mild mitral valve regurgitation. No  evidence of mitral stenosis. Moderate mitral annular calcification.  6. Tricuspid valve regurgitation is mild to moderate.  7. The aortic valve is normal in structure. Aortic valve regurgitation is  not visualized. No aortic stenosis is present.  8. The inferior vena cava is normal in size with greater than 50%  respiratory variability, suggesting right atrial pressure of 3 mmHg.    07/26/2018: TTE IMPRESSIONS  1. The left ventricle has normal systolic function with an ejection  fraction of 60-65%. The cavity size was normal. There is mildly increased  left ventricular wall thickness. Left ventricular diastolic Doppler  parameters are consistent with  pseudonormalization.  2. The right ventricle has normal systolic function. The cavity was  normal. There is no increase in right ventricular wall thickness.  3. Left atrial size was severely dilated.  4. The mitral valve is normal in structure. There is mild thickening.  There is moderate mitral annular calcification present.  5. The tricuspid valve is normal in structure.  6. The aortic valve is tricuspid There is mild thickening of the aortic  valve.  7. The pulmonic valve was normal in structure.  8. Normal LV systolic function; mild LVH; moderate diastolic dysfunction;  moderate LAE; mild MR and TR.  9. Right atrial pressure is estimated at 3 mmHg.     08/17/2018; stress myoview  Nuclear stress EF: 43%.  The study is normal.  This is an intermediate risk study.   Normal perfusion. LVEF 43% with global hypokinesis. Clinical correlation with echo is advised as LVEF may be falsely low due to frequent PAC's and PVC's. This is an intermediate risk study.   Recent Labs: 08/28/2020: Hemoglobin 13.3;  Platelets 133 09/30/2020: Magnesium 2.0 10/16/2020: BUN 32; Creatinine, Ser 1.14; Potassium 4.0; Sodium 143  No results found for requested labs within last 8760 hours.   CrCl cannot be calculated (Patient's most recent lab result is older than the maximum 21 days allowed.).   Wt Readings from Last 3 Encounters:  10/27/20 214 lb (97.1 kg)  09/30/20 226 lb 6.4 oz (102.7 kg)  09/10/20 227 lb (103 kg)     Other studies reviewed: Additional studies/records reviewed today include: summarized above  ASSESSMENT AND PLAN:  1. Paroxysmal Afib     CHA2DS2Vasc is 4, on Eliquis, appropriately dosed     0% % burden since April visit     Sotalol with stable QT     Labs up to date  2. HTN     Looks good  3. Sleep apnea     Pending updated sleep study for Dr. Ron Parker      4. SOB 5. Hx of diastolic HF     Does not appear overtly volume OL, body habitus make clear volume assesment difficult     No rest SOB, no symptoms of PND or orthopnea     Minimal DOE and suspect this is non-cardiac     No anginal sounding symptoms     No CP  cardiac-wise, reports she feels well when not in AF    Disposition: remotes monthy as usual, will see her back in 30mo sooner if needed    Current medicines are reviewed at length with the patient today.  The patient did not have any concerns regarding medicines.  SVenetia Night PA-C 11/09/2020 5:44 PM         [place] [phone](office)  [phone](fax)

## 2020-11-10 ENCOUNTER — Other Ambulatory Visit: Payer: Self-pay | Admitting: Internal Medicine

## 2020-11-10 NOTE — Progress Notes (Signed)
[facility] Summary Report / Loop Recorder 

## 2020-11-11 ENCOUNTER — Encounter: Payer: Self-pay | Admitting: Physician Assistant

## 2020-11-11 ENCOUNTER — Ambulatory Visit (INDEPENDENT_AMBULATORY_CARE_PROVIDER_SITE_OTHER): Payer: Medicare Other | Admitting: Physician Assistant

## 2020-11-11 ENCOUNTER — Other Ambulatory Visit: Payer: Self-pay

## 2020-11-11 VITALS — BP 136/82 | HR 60 | Ht <= 58 in | Wt 213.8 lb

## 2020-11-11 DIAGNOSIS — I10 Essential (primary) hypertension: Secondary | ICD-10-CM

## 2020-11-11 DIAGNOSIS — I5032 Chronic diastolic (congestive) heart failure: Secondary | ICD-10-CM

## 2020-11-11 DIAGNOSIS — Z5181 Encounter for therapeutic drug level monitoring: Secondary | ICD-10-CM | POA: Diagnosis not present

## 2020-11-11 DIAGNOSIS — Z79899 Other long term (current) drug therapy: Secondary | ICD-10-CM | POA: Diagnosis not present

## 2020-11-11 DIAGNOSIS — I480 Paroxysmal atrial fibrillation: Secondary | ICD-10-CM

## 2020-11-11 DIAGNOSIS — G4730 Sleep apnea, unspecified: Secondary | ICD-10-CM | POA: Diagnosis not present

## 2020-11-11 LAB — CUP PACEART INCLINIC DEVICE CHECK
Date Time Interrogation Session: 20220601170234
Implantable Pulse Generator Implant Date: 20200710

## 2020-11-11 NOTE — Patient Instructions (Signed)
Medication Instructions:   Your physician recommends that you continue on your current medications as directed. Please refer to the Current Medication list given to you today.  *If you need a refill on your cardiac medications before your next appointment, please call your pharmacy*   Lab Work: [place]   If you have labs (blood work) drawn today and your tests are completely normal, you will receive your results only by: Marland Kitchen MyChart Message (if you have MyChart) OR . A paper copy in the mail If you have any lab test that is abnormal or we need to change your treatment, we will call you to review the results.   Testing/Procedures: NONE ORDERED  TODAY    Follow-Up: At [facility], you and your health needs are our priority.  As part of our continuing mission to provide you with exceptional heart care, we have created designated Provider Care Teams.  These Care Teams include your primary Cardiologist (physician) and Advanced Practice Providers (APPs -  Physician Assistants and Nurse Practitioners) who all work together to provide you with the care you need, when you need it.  We recommend signing up for the patient portal called "MyChart".  Sign up information is provided on this After Visit Summary.  MyChart is used to connect with patients for Virtual Visits (Telemedicine).  Patients are able to view lab/test results, encounter notes, upcoming appointments, etc.  Non-urgent messages can be sent to your provider as well.   To learn more about what you can do with MyChart, go to [email or web address].    Your next appointment:   4 month(s)  The format for your next appointment:   In Person  Provider:   You may see [name], MD or one of the following Advanced Practice Providers on your designated Care Team:    Chanetta Marshall, NP  Tommye Standard, PA-C  Legrand Como "Oda Kilts, [place]    Other Instructions

## 2020-11-19 LAB — CUP PACEART REMOTE DEVICE CHECK
Date Time Interrogation Session: 20220607005604
Implantable Pulse Generator Implant Date: 20200710

## 2020-11-23 ENCOUNTER — Ambulatory Visit (INDEPENDENT_AMBULATORY_CARE_PROVIDER_SITE_OTHER): Payer: Medicare Other

## 2020-11-23 DIAGNOSIS — I5032 Chronic diastolic (congestive) heart failure: Secondary | ICD-10-CM | POA: Diagnosis not present

## 2020-11-25 DIAGNOSIS — Z79899 Other long term (current) drug therapy: Secondary | ICD-10-CM | POA: Diagnosis not present

## 2020-11-25 DIAGNOSIS — H53002 Unspecified amblyopia, left eye: Secondary | ICD-10-CM | POA: Diagnosis not present

## 2020-11-25 DIAGNOSIS — H43811 Vitreous degeneration, right eye: Secondary | ICD-10-CM | POA: Diagnosis not present

## 2020-11-25 DIAGNOSIS — H16223 Keratoconjunctivitis sicca, not specified as Sjogren's, bilateral: Secondary | ICD-10-CM | POA: Diagnosis not present

## 2020-11-26 ENCOUNTER — Telehealth: Payer: Self-pay | Admitting: Gastroenterology

## 2020-11-26 NOTE — Telephone Encounter (Signed)
Left message on machine to call back  

## 2020-11-26 NOTE — Telephone Encounter (Signed)
Inbound call from patient inquiring if she could stop dicyclomine. States it makes her nasea, dizzy, have heartburn and diarrhea. Best contact number [phone]

## 2020-11-26 NOTE — Telephone Encounter (Signed)
The pt was advised that she can stop the dicyclomine if it is causing her to have unfavorable side effects.  She will call back if the issues do not resolve.

## 2020-12-01 DIAGNOSIS — E039 Hypothyroidism, unspecified: Secondary | ICD-10-CM | POA: Diagnosis not present

## 2020-12-01 DIAGNOSIS — R7309 Other abnormal glucose: Secondary | ICD-10-CM | POA: Diagnosis not present

## 2020-12-01 DIAGNOSIS — I10 Essential (primary) hypertension: Secondary | ICD-10-CM | POA: Diagnosis not present

## 2020-12-08 DIAGNOSIS — M810 Age-related osteoporosis without current pathological fracture: Secondary | ICD-10-CM | POA: Diagnosis not present

## 2020-12-08 DIAGNOSIS — Z0000 Encounter for general adult medical examination without abnormal findings: Secondary | ICD-10-CM | POA: Diagnosis not present

## 2020-12-08 DIAGNOSIS — I2510 Atherosclerotic heart disease of native coronary artery without angina pectoris: Secondary | ICD-10-CM | POA: Diagnosis not present

## 2020-12-08 DIAGNOSIS — E039 Hypothyroidism, unspecified: Secondary | ICD-10-CM | POA: Diagnosis not present

## 2020-12-08 DIAGNOSIS — I10 Essential (primary) hypertension: Secondary | ICD-10-CM | POA: Diagnosis not present

## 2020-12-08 DIAGNOSIS — Z7901 Long term (current) use of anticoagulants: Secondary | ICD-10-CM | POA: Diagnosis not present

## 2020-12-08 DIAGNOSIS — I5032 Chronic diastolic (congestive) heart failure: Secondary | ICD-10-CM | POA: Diagnosis not present

## 2020-12-08 DIAGNOSIS — E7800 Pure hypercholesterolemia, unspecified: Secondary | ICD-10-CM | POA: Diagnosis not present

## 2020-12-08 DIAGNOSIS — I700 Atherosclerosis of aorta: Secondary | ICD-10-CM | POA: Diagnosis not present

## 2020-12-08 DIAGNOSIS — Z79899 Other long term (current) drug therapy: Secondary | ICD-10-CM | POA: Diagnosis not present

## 2020-12-15 NOTE — Progress Notes (Signed)
[facility] Summary Report / Loop Recorder 

## 2020-12-17 DIAGNOSIS — M25561 Pain in right knee: Secondary | ICD-10-CM | POA: Diagnosis not present

## 2020-12-17 DIAGNOSIS — M5136 Other intervertebral disc degeneration, lumbar region: Secondary | ICD-10-CM | POA: Diagnosis not present

## 2020-12-17 DIAGNOSIS — M150 Primary generalized (osteo)arthritis: Secondary | ICD-10-CM | POA: Diagnosis not present

## 2020-12-17 DIAGNOSIS — M19042 Primary osteoarthritis, left hand: Secondary | ICD-10-CM | POA: Diagnosis not present

## 2020-12-17 DIAGNOSIS — M109 Gout, unspecified: Secondary | ICD-10-CM | POA: Diagnosis not present

## 2020-12-17 DIAGNOSIS — M3390 Dermatopolymyositis, unspecified, organ involvement unspecified: Secondary | ICD-10-CM | POA: Diagnosis not present

## 2020-12-17 DIAGNOSIS — M1712 Unilateral primary osteoarthritis, left knee: Secondary | ICD-10-CM | POA: Diagnosis not present

## 2020-12-17 DIAGNOSIS — M1711 Unilateral primary osteoarthritis, right knee: Secondary | ICD-10-CM | POA: Diagnosis not present

## 2020-12-17 DIAGNOSIS — Z79899 Other long term (current) drug therapy: Secondary | ICD-10-CM | POA: Diagnosis not present

## 2020-12-17 DIAGNOSIS — M79641 Pain in right hand: Secondary | ICD-10-CM | POA: Diagnosis not present

## 2020-12-17 DIAGNOSIS — M549 Dorsalgia, unspecified: Secondary | ICD-10-CM | POA: Diagnosis not present

## 2020-12-17 DIAGNOSIS — M810 Age-related osteoporosis without current pathological fracture: Secondary | ICD-10-CM | POA: Diagnosis not present

## 2020-12-17 DIAGNOSIS — M1990 Unspecified osteoarthritis, unspecified site: Secondary | ICD-10-CM | POA: Diagnosis not present

## 2020-12-17 DIAGNOSIS — M25531 Pain in right wrist: Secondary | ICD-10-CM | POA: Diagnosis not present

## 2020-12-17 DIAGNOSIS — M79642 Pain in left hand: Secondary | ICD-10-CM | POA: Diagnosis not present

## 2020-12-17 DIAGNOSIS — Z7952 Long term (current) use of systemic steroids: Secondary | ICD-10-CM | POA: Diagnosis not present

## 2020-12-17 DIAGNOSIS — M19041 Primary osteoarthritis, right hand: Secondary | ICD-10-CM | POA: Diagnosis not present

## 2020-12-17 DIAGNOSIS — M25569 Pain in unspecified knee: Secondary | ICD-10-CM | POA: Diagnosis not present

## 2020-12-17 DIAGNOSIS — M1812 Unilateral primary osteoarthritis of first carpometacarpal joint, left hand: Secondary | ICD-10-CM | POA: Diagnosis not present

## 2020-12-23 LAB — CUP PACEART REMOTE DEVICE CHECK
Date Time Interrogation Session: 20220710005457
Implantable Pulse Generator Implant Date: 20200710

## 2020-12-24 ENCOUNTER — Telehealth: Payer: Self-pay

## 2020-12-24 NOTE — Telephone Encounter (Signed)
The patient called to get help with her lights flashing in the middle of the night. I conference in [employer] tech support to get additional help. The patient monitor is working properly and up to date.

## 2020-12-28 ENCOUNTER — Ambulatory Visit (INDEPENDENT_AMBULATORY_CARE_PROVIDER_SITE_OTHER): Payer: Medicare Other

## 2020-12-28 DIAGNOSIS — I480 Paroxysmal atrial fibrillation: Secondary | ICD-10-CM

## 2020-12-31 ENCOUNTER — Ambulatory Visit (HOSPITAL_BASED_OUTPATIENT_CLINIC_OR_DEPARTMENT_OTHER): Payer: Medicare Other | Admitting: Cardiology

## 2020-12-31 ENCOUNTER — Other Ambulatory Visit: Payer: Self-pay

## 2021-01-04 ENCOUNTER — Telehealth: Payer: Self-pay | Admitting: Cardiology

## 2021-01-04 NOTE — Telephone Encounter (Signed)
Patient states the bed for her sleep study was too high and she was unable to get it done. She states she has not heard back about rescheduling, but was told all the beds are high. Please advise.

## 2021-01-06 NOTE — Telephone Encounter (Signed)
Pt calling in for update on sleep study and what alternate options are available... please advise

## 2021-01-07 NOTE — Addendum Note (Signed)
Addended by: Freada Bergeron on: 01/07/2021 06:12 PM   Modules accepted: Orders

## 2021-01-08 NOTE — Telephone Encounter (Signed)
I s/w the pt this morning to discuss about an in-home sleep study It[facility]. I did ask pt first if she has either a smart phone or I-phone with blue-tooth. Pt answered yes. I then set up for the pt to come by the office for about 10-15 minutes to meet with me so that I may set her up with the app for the sleep study and review the instructions with her. Pt is agreeable to meet Monday 01/11/21 11 am. I stated to the pt when she gets here to stop at the check-in desk and tell the ladies she is here to see [name] with the in-home sleep studies, and that the front desk will call me. Pt thanked me for the help and the call. I see order has been placed by Tommye Standard, PAC. I will do the stop bang when the pt comes on Monday 01/11/21.

## 2021-01-11 ENCOUNTER — Telehealth (HOSPITAL_BASED_OUTPATIENT_CLINIC_OR_DEPARTMENT_OTHER): Payer: Self-pay

## 2021-01-11 NOTE — Telephone Encounter (Signed)
Per pt she would like to be called at [phone] once study has been approved.

## 2021-01-11 NOTE — Telephone Encounter (Signed)
Per [employer] no prior authorization is not required for CPT 95800.  Jonni Sanger aware. The automated system was unable to generate a ref# at time of call.

## 2021-01-11 NOTE — Telephone Encounter (Signed)
I will call the pt with PIN # once the pt has been approved.

## 2021-01-11 NOTE — Telephone Encounter (Signed)
[name], [name] Patricia, RN 3:48 PM Note Per [employer] no prior authorization is not required for CPT 95800.  Jonni Sanger aware. The automated system was unable to generate a ref# at time of call.     Pt has been informed ok to proceed with Itamar Sleep Study. Pt states she will do sleep study tomorrow night 01/12/21. Pt aware we will notify her of results once the study has been read by the cardiologist. Pt thanked me for the call and the help today. I will send note to [facility] who ordered study.

## 2021-01-11 NOTE — Telephone Encounter (Signed)
Pt came in and has been set up with [facility]amar Sleep Study. I will place StopBang and send note to sleep pool for insurance approval.   Patient Name: [name]        DOB: 06/13/1946      Height:  '4\' 8"'$     Weight: 213lb  Office Name: [facility] [place] [facility]         Referring Provider: Tommye Standard, [facility]  Today's Date: 01/11/21  Date:   STOP BANG RISK ASSESSMENT S (snore) Have you been told that you snore?     YES   T (tired) Are you often tired, fatigued, or sleepy during the day?   YES  O (obstruction) Do you stop breathing, choke, or gasp during sleep? YES   P (pressure) Do you have or are you being treated for high blood pressure? YES   B (BMI) Is your body index greater than 35 kg/m? YES   A (age) Are you 12 years old or older? YES   N (neck) Do you have a neck circumference greater than 16 inches?   YES   G (gender) Are you a female? NO   TOTAL STOP/BANG "YES" ANSWERS 7                                                                       For Office Use Only              Procedure Order Form    YES to 3+ Stop Bang questions OR two clinical symptoms - patient qualifies for WatchPAT (CPT 95800)             Clinical Notes: Will consult Sleep Specialist and refer for management of therapy due to patient increased risk of Sleep Apnea. Ordering a sleep study due to the following two clinical symptoms: Excessive daytime sleepiness G47.10 / Gastroesophageal reflux K21.9 / Nocturia R35.1 / Morning Headaches G44.221 / Difficulty concentrating R41.840 / Memory problems or poor judgment G31.84 / Personality changes or irritability R45.4 / Loud snoring R06.83 / Depression F32.9 / Unrefreshed by sleep G47.8 / Impotence N52.9 / History of high blood pressure R03.0 / Insomnia G47.00    I understand that I am proceeding with a home sleep apnea test as ordered by my treating physician. I understand that untreated sleep apnea is a serious cardiovascular risk factor and it is my responsibility to  perform the test and seek management for sleep apnea. I will be contacted with the results and be managed for sleep apnea by a local sleep physician. I will be receiving equipment and further instructions from [facility]. I shall promptly ship back the equipment via the included mailing label. I understand my insurance will be billed for the test and as the patient I am responsible for any insurance related out-of-pocket costs incurred. I have been provided with written instructions and can call for additional video or telephonic instruction, with 24-hour availability of qualified personnel to answer any questions: Patient Help Desk [phone].  Patient Signature ______________________________________________________   Date______________________ Patient Telemedicine Verbal Consent

## 2021-01-12 ENCOUNTER — Encounter (INDEPENDENT_AMBULATORY_CARE_PROVIDER_SITE_OTHER): Payer: Medicare Other | Admitting: Cardiology

## 2021-01-12 DIAGNOSIS — G4733 Obstructive sleep apnea (adult) (pediatric): Secondary | ICD-10-CM

## 2021-01-12 DIAGNOSIS — G4734 Idiopathic sleep related nonobstructive alveolar hypoventilation: Secondary | ICD-10-CM | POA: Diagnosis not present

## 2021-01-15 NOTE — Procedures (Signed)
   Sleep Study Report  Patient Information Study Date: 01/12/21 Patient Name: [name] Patient ID: [id number] Birth Date: 03/27/2046 Age: 75 Gender: Female BMI: 48.1 (W=214 lb, H=4' 8'') Neck Circ.: 53 '' Referring Physician: Melina Copa, PA  TEST DESCRIPTION: Home sleep apnea testing was completed using the WatchPat, a Type 1 device, utilizing  peripheral arterial tonometry (PAT), chest movement, actigraphy, pulse oximetry, pulse rate, body position and snore.  AHI was calculated with apnea and hypopnea using valid sleep time as the denominator. RDI includes apneas,  hypopneas, and RERAs. The data acquired and the scoring of sleep and all associated events were performed in  accordance with the recommended standards and specifications as outlined in the AASM Manual for the Scoring of  Sleep and Associated Events 2.2.0 (2015).  FINDINGS: 1. Severe Obstructive Sleep Apnea with AHI 43/hr.  2. No Central Sleep Apnea with pAHIc 0.8/hr. 3. Oxygen desaturations as low as 73%. 4. Severe snoring was present. O2 sats were < 88% for 21.3 min. 5. Total sleep time was 7 hrs and 17 min. 6. 27% of total sleep time was spent in REM sleep.  7. Normal sleep onset latency at 36 min.  8. Normal REM sleep onset latency at 76 min.  9. Total awakenings were 8.   DIAGNOSIS:  Severe Obstructive Sleep Apnea (G47.33) Nocturnal Hypoxemia  RECOMMENDATIONS:  1. Clinical correlation of these findings is necessary. The decision to treat obstructive sleep apnea (OSA) is usually  based on the presence of apnea symptoms or the presence of associated medical conditions such as Hypertension,  Congestive Heart Failure, Atrial Fibrillation or Obesity. The most common symptoms of OSA are snoring, gasping for  breath while sleeping, daytime sleepiness and fatigue.   2. Initiating apnea therapy is recommended given the presence of symptoms and/or associated conditions.  Recommend proceeding with one of the  following:   a. Auto-CPAP therapy with a pressure range of 5-20cm H2O.   b. An oral appliance (OA) that can be obtained from certain dentists with expertise in sleep medicine. These are  primarily of use in non-obese patients with mild and moderate disease.   c. An ENT consultation which may be useful to look for specific causes of obstruction and possible treatment  options.   d. If patient is intolerant to PAP therapy, consider referral to ENT for evaluation for hypoglossal nerve stimulator.   3. Close follow-up is necessary to ensure success with CPAP or oral appliance therapy for maximum benefit .  4. A follow-up oximetry study on CPAP is recommended to assess the adequacy of therapy and determine the need  for supplemental oxygen or the potential need for Bi-level therapy. An arterial blood gas to determine the adequacy of  baseline ventilation and oxygenation should also be considered.  5. Healthy sleep recommendations include: adequate nightly sleep (normal 7-9 hrs/night), avoidance of caffeine after  noon and alcohol near bedtime, and maintaining a sleep environment that is cool, dark and quiet.  6. Weight loss for overweight patients is recommended. Even modest amounts of weight loss can significantly  improve the severity of sleep apnea.  7. Snoring recommendations include: weight loss where appropriate, side sleeping, and avoidance of alcohol before  bed.  Signature: Electronically Signed: 01/16/21 Fransico Him, MD; [facility]; [place], American Board of Sleep Medicine Report prepared by: Fransico Him, MD

## 2021-01-16 ENCOUNTER — Ambulatory Visit: Payer: Medicare Other

## 2021-01-16 DIAGNOSIS — G4733 Obstructive sleep apnea (adult) (pediatric): Secondary | ICD-10-CM

## 2021-01-20 NOTE — Progress Notes (Signed)
[facility] Summary Report / Loop Recorder 

## 2021-01-20 NOTE — Telephone Encounter (Signed)
Patient is following up regarding her at home sleep study. She states she has questions. Please return call to her cell phone at [phone].

## 2021-01-22 NOTE — Telephone Encounter (Signed)
pt has a few follow up questions regarding her sleep study.. please contact via phone rather than text

## 2021-01-25 ENCOUNTER — Ambulatory Visit (INDEPENDENT_AMBULATORY_CARE_PROVIDER_SITE_OTHER): Payer: Medicare Other

## 2021-01-25 DIAGNOSIS — I480 Paroxysmal atrial fibrillation: Secondary | ICD-10-CM | POA: Diagnosis not present

## 2021-01-25 NOTE — Telephone Encounter (Signed)
Sueanne Margarita, MD  Freada Bergeron, CMA Please let patient know that they have sleep apnea.  Recommend therapeutic CPAP titration for treatment of patient's sleep disordered breathing.  If unable to perform an in lab titration then initiate ResMed auto CPAP from 4 to 15cm H2O with heated humidity and mask of choice and overnight pulse ox on CPAP.

## 2021-01-25 NOTE — Telephone Encounter (Signed)
Called patient and informed her of her sleep study results.

## 2021-01-25 NOTE — Telephone Encounter (Signed)
All paper work sent to dr Ron Parker office and copy of sleep study sent to patient.

## 2021-01-25 NOTE — Telephone Encounter (Signed)
Informed patient of sleep study results and patient understanding was verbalized. Patient understands her sleep study showed they have sleep apnea  Recommend therapeutic CPAP titration for treatment of patient's sleep disordered breathing  Upon patient request all patients paperwork has been sent to Dr Oneal Grout for evaluation of a oral appliance. Patient ask that a copy of her sleep study been mailed to her home address. Patient agrees with treatment.

## 2021-01-26 LAB — CUP PACEART REMOTE DEVICE CHECK
Date Time Interrogation Session: 20220812011836
Implantable Pulse Generator Implant Date: 20200710

## 2021-02-04 DIAGNOSIS — M170 Bilateral primary osteoarthritis of knee: Secondary | ICD-10-CM | POA: Diagnosis not present

## 2021-02-13 NOTE — Progress Notes (Signed)
[facility] Summary Report / Loop Recorder 

## 2021-02-16 ENCOUNTER — Other Ambulatory Visit: Payer: Self-pay | Admitting: Internal Medicine

## 2021-02-16 NOTE — Telephone Encounter (Signed)
Prescription refill request for Eliquis received.  Indication: afib  Last office visit: Charlcie Cradle, 11/11/2020 Scr: 1.14, 10/16/2020 Age: 75 yo  Weight: 97 kg   Refill sent.

## 2021-02-24 LAB — CUP PACEART REMOTE DEVICE CHECK
Date Time Interrogation Session: 20220914011547
Implantable Pulse Generator Implant Date: 20200710

## 2021-03-01 ENCOUNTER — Ambulatory Visit (INDEPENDENT_AMBULATORY_CARE_PROVIDER_SITE_OTHER): Payer: Medicare Other

## 2021-03-01 DIAGNOSIS — I480 Paroxysmal atrial fibrillation: Secondary | ICD-10-CM

## 2021-03-08 NOTE — Progress Notes (Signed)
[facility] Summary Report / Loop Recorder 

## 2021-03-15 ENCOUNTER — Ambulatory Visit (INDEPENDENT_AMBULATORY_CARE_PROVIDER_SITE_OTHER): Payer: Medicare Other | Admitting: Internal Medicine

## 2021-03-15 ENCOUNTER — Encounter: Payer: Self-pay | Admitting: Internal Medicine

## 2021-03-15 ENCOUNTER — Other Ambulatory Visit: Payer: Self-pay

## 2021-03-15 VITALS — BP 112/90 | HR 59 | Ht <= 58 in | Wt 227.8 lb

## 2021-03-15 DIAGNOSIS — I2510 Atherosclerotic heart disease of native coronary artery without angina pectoris: Secondary | ICD-10-CM | POA: Diagnosis not present

## 2021-03-15 DIAGNOSIS — I10 Essential (primary) hypertension: Secondary | ICD-10-CM

## 2021-03-15 DIAGNOSIS — I5032 Chronic diastolic (congestive) heart failure: Secondary | ICD-10-CM

## 2021-03-15 DIAGNOSIS — G4733 Obstructive sleep apnea (adult) (pediatric): Secondary | ICD-10-CM | POA: Diagnosis not present

## 2021-03-15 DIAGNOSIS — I480 Paroxysmal atrial fibrillation: Secondary | ICD-10-CM | POA: Diagnosis not present

## 2021-03-15 NOTE — Progress Notes (Signed)
PCP: [name] Morning, DO   Primary EP: Dr Rayann Heman  [name] is a 75 y.o. female who presents today for routine electrophysiology followup.  Since last being seen in our clinic, the patient reports doing reasonably well.  She is not very active chronically.  She fell in March and continues to recover.  She ahs chronic dependant edema.  Today, she denies symptoms of palpitations, chest pain, shortness of breath,  dizziness, presyncope, or syncope.  The patient is otherwise without complaint today.   Past Medical History:  Diagnosis Date   Anemia    Bilateral leg edema    C. difficile enteritis    CAD (coronary artery disease)    a. LHC 03/2006: EF 65%, mid LAD 40-50%, ostial D1 70-80%, normal circumflex, normal RCA;  b. Lexiscan Myoview 09/2010: No ischemia or scar, EF 75%;  c. Lex MV 5/14:  Normal, no ischemia, EF 71% // Myoview 08/2018: EF 43, normal perfusion; Intermediate Risk due to low EF (Echo 07/2018 with normal EF)    Cataract    bilateral-had surgery   Cellulitis of right leg    Hx - resolved   Chronic diastolic CHF (congestive heart failure) ([place])    Echo 8/14:  Mild LVH, EF 55-65%, normal wall motion, Tr AI, mild MR, mod TR, PASP 45 // 12/2014 Echo: EF 60-65%, nl wall motion, mildly dil RA/LA, PASP 44mHg.// Echo 07/2018: EF 60-65, mild LVH, mod diastolic dysfunction, normal RVSF, severe LAE, mod MAC, mild MR, mild TR    Continuous urine leakage    Dermatomyositis (HCC)    Deviated septum    Essential hypertension    Family history of adverse reaction to anesthesia    .' MY SON IS A DIFFICULT INTUBATION"   Gall stones    a. 08/2014 Abd U/S:  Large gallstone measuring 4.2 cm.    GERD (gastroesophageal reflux disease)    Gout    Hiatal hernia    History of pancreatitis    History of PFTs    a. PFTs 10/2012: normal.    Hyperlipidemia    Hypothyroidism    Internal hemorrhoids    Morbid obesity ([facility]    Multifocal atrial tachycardia ([facility] 08/10/2018   Noted during admission in  07/2018   Osteoarthritis    spine hips knees   PAF (paroxysmal atrial fibrillation) ([facility]    a. CHA2DS2VASc = 5-->poor OAC candidate 2/2 falls. No problems now per patient 03/2016   Pneumonia    Seizures ([facility]    as a baby   Shingles    Hx   Sleep apnea    Does not use CPAP   Spinal cord compression ([facility] 02/21/2014   Past Surgical History:  Procedure Laterality Date   CATARACT EXTRACTION, BILATERAL Bilateral    COLONOSCOPY  03/2011   Implantable loop recorder placement  12/21/2018   Medtronic Reveal LBethanymodel [id number] (SN [id number]) implanted by Dr ARayann Hemanin office for AF management   NASAL SEPTUM SURGERY     TOTAL ABDOMINAL HYSTERECTOMY     VERTEBROPLASTY  Sept 7 and May 30, 2014   x 2, T11/T12   WISDOM TOOTH EXTRACTION      ROS- all systems are reviewed and negatives except as per HPI above  Current Outpatient Medications  Medication Sig Dispense Refill   acetaminophen (TYLENOL) 500 MG tablet Take 500-1,000 mg by mouth every 6 (six) hours as needed for mild pain or fever.      albuterol (VENTOLIN HFA) 108 (90 Base)  MCG/ACT inhaler albuterol sulfate HFA 90 mcg/actuation aerosol inhaler  INHALE 1-2 PUFFS BY MOUTH EVERY 6 HOURS AS NEEDED FOR WHEEZE OR SHORTNESS OF BREATH     alendronate (FOSAMAX) 70 MG tablet Take 1 tablet by mouth every Tuesday.      allopurinol (ZYLOPRIM) 100 MG tablet Take 100 mg by mouth 2 (two) times daily.      aluminum hydroxide-magnesium carbonate (GAVISCON) 95-358 MG/15ML SUSP Take 15 mLs by mouth as needed.     CALCIUM PO Take 500 mg by mouth daily.     Cholecalciferol (VITAMIN D3) 2000 UNITS TABS Take 2,000 Units by mouth daily.     ELIQUIS 5 MG TABS tablet TAKE 1 TABLET BY MOUTH TWICE A DAY 60 tablet 5   famotidine (PEPCID) 20 MG tablet Take 20 mg by mouth daily.     folic acid (FOLVITE) 1 MG tablet Take 1 mg by mouth 4 (four) times daily.     furosemide (LASIX) 40 MG tablet Take 1 tablet (40 mg total) by mouth 2 (two) times daily. 120 tablet 5    levothyroxine (SYNTHROID) 88 MCG tablet Take 88 mcg by mouth daily.      losartan (COZAAR) 25 MG tablet TAKE 1 TABLET BY MOUTH  DAILY 90 tablet 2   methotrexate (RHEUMATREX) 2.5 MG tablet Take 12.5 mg by mouth every Wednesday.   3   metoprolol succinate (TOPROL-XL) 25 MG 24 hr tablet TAKE 1 AND 1/2 TABLETS BY  MOUTH TWICE DAILY 270 tablet 3   Multiple Vitamin (MULTIVITAMIN) tablet Take 1 tablet by mouth daily.     OVER THE COUNTER MEDICATION Place 1 drop into both eyes daily as needed (for dry eyes. Advanced eye relief).     phenylephrine-shark liver oil-mineral oil-petrolatum (PREPARATION H) 0.25-3-14-71.9 % rectal ointment Place 1 application rectally as needed for hemorrhoids.     potassium chloride SA (KLOR-CON M20) 20 MEQ tablet Take 1 tablet (20 mEq total) by mouth 3 (three) times daily. 60 tablet 11   predniSONE (DELTASONE) 5 MG tablet Take 5 mg by mouth daily with breakfast.     rosuvastatin (CRESTOR) 5 MG tablet Take 5 mg by mouth every other day.      saccharomyces boulardii (FLORASTOR) 250 MG capsule Take 250 mg by mouth 2 (two) times daily.     sotalol (BETAPACE) 80 MG tablet TAKE 1 TABLET BY MOUTH  TWICE DAILY 180 tablet 3   vitamin B-12 (CYANOCOBALAMIN) 1000 MCG tablet Take 1,000 mcg by mouth daily.     cetirizine (ZYRTEC) 10 MG tablet Take 10 mg by mouth daily.     dicyclomine (BENTYL) 10 MG capsule Take 1 capsule (10 mg total) by mouth 4 (four) times daily -  before meals and at bedtime. 360 capsule 3   No current facility-administered medications for this visit.    Physical Exam: Vitals:   03/15/21 1200  BP: 112/90  Pulse: (!) 59  SpO2: 97%  Weight: 227 lb 12.8 oz (103.3 kg)  Height: _0  (1.422 m)    GEN- The patient is elderly and frail, overweiht appearing, alert and oriented x 3 today.   Head- normocephalic, atraumatic Eyes-  Sclera clear, conjunctiva pink Ears- hearing intact Oropharynx- clear Lungs- Clear to ausculation bilaterally, normal work of  breathing Heart- Regular rate and rhythm, no murmurs, rubs or gallops, PMI not laterally displaced GI- soft, NT, ND, + BS Extremities- no clubbing, cyanosis, + dependant edema  Wt Readings from Last 3 Encounters:  03/15/21 227 lb 12.8  oz (103.3 kg)  11/11/20 213 lb 12.8 oz (97 kg)  10/27/20 214 lb (97.1 kg)    EKG tracing ordered today is personally reviewed and shows sinus rhythm 59 bpm, Qtc 467 msec  Assessment and Plan:  Paroxysmal atrial fibrillation Burden 0% by ILR Chads2vasc score is 4.  Continue eliquis Labs from May are reviewed Bmet, mg today Qt is stable EP to follow while on sotalol every 6 months  2. OSA Does not wear CPAP Sleep study 01/12/21 reviewed which shows that her OSA is severe She follows with Dr Ron Parker and has a scheduled visit already  3. HTN Stable No change required today bmet  4. Morbid obesity Body mass index is 51.07 kg/m. We discussed lifestyle modification today  5. CAD No ischemic symptoms No changes  6. Chronic diastolic dysfunction Euvolemic today Bmet, mg, pro BNP ordered   Return to see EP APP every 6 months  [name] MD, [facility] 03/15/2021 12:15 PM

## 2021-03-15 NOTE — Patient Instructions (Addendum)
Medication Instructions:  Your physician recommends that you continue on your current medications as directed. Please refer to the Current Medication list given to you today. *If you need a refill on your cardiac medications before your next appointment, please call your pharmacy*  Lab Work: Pro BNP, BMP, MAG If you have labs (blood work) drawn today and your tests are completely normal, you will receive your results only by: [place] (if you have MyChart) OR A paper copy in the mail If you have any lab test that is abnormal or we need to change your treatment, we will call you to review the results.  Testing/Procedures: None.  Follow-Up: At [facility], you and your health needs are our priority.  As part of our continuing mission to provide you with exceptional heart care, we have created designated Provider Care Teams.  These Care Teams include your primary Cardiologist (physician) and Advanced Practice Providers (APPs -  Physician Assistants and Nurse Practitioners) who all work together to provide you with the care you need, when you need it.  Your physician wants you to follow-up in: 6 months with one of the following Advanced Practice Providers on your designated Care Team:    Tommye Standard, [place] Legrand Como "Jonni Sanger" [place], [place]   You will receive a reminder letter in the mail two months in advance. If you don't receive a letter, please call our office to schedule the follow-up appointment.  We recommend signing up for the patient portal called "MyChart".  Sign up information is provided on this After Visit Summary.  MyChart is used to connect with patients for Virtual Visits (Telemedicine).  Patients are able to view lab/test results, encounter notes, upcoming appointments, etc.  Non-urgent messages can be sent to your provider as well.   To learn more about what you can do with MyChart, go to [email or web address].    Any Other Special Instructions Will Be Listed Below (If  Applicable).

## 2021-03-16 LAB — BASIC METABOLIC PANEL
BUN/Creatinine Ratio: 20 (ref 12–28)
BUN: 17 mg/dL (ref 8–27)
CO2: 24 mmol/L (ref 20–29)
Calcium: 9.5 mg/dL (ref 8.7–10.3)
Chloride: 107 mmol/L — ABNORMAL HIGH (ref 96–106)
Creatinine, Ser: 0.84 mg/dL (ref 0.57–1.00)
Glucose: 93 mg/dL (ref 70–99)
Potassium: 4.3 mmol/L (ref 3.5–5.2)
Sodium: 144 mmol/L (ref 134–144)
eGFR: 73 mL/min/{1.73_m2} (ref >59)

## 2021-03-16 LAB — PRO B NATRIURETIC PEPTIDE: NT-Pro BNP: 822 pg/mL — ABNORMAL HIGH (ref 0–301)

## 2021-03-16 LAB — MAGNESIUM: Magnesium: 2 mg/dL (ref 1.6–2.3)

## 2021-03-17 DIAGNOSIS — L989 Disorder of the skin and subcutaneous tissue, unspecified: Secondary | ICD-10-CM | POA: Diagnosis not present

## 2021-03-17 DIAGNOSIS — Z23 Encounter for immunization: Secondary | ICD-10-CM | POA: Diagnosis not present

## 2021-03-17 DIAGNOSIS — C44629 Squamous cell carcinoma of skin of left upper limb, including shoulder: Secondary | ICD-10-CM | POA: Diagnosis not present

## 2021-03-18 ENCOUNTER — Other Ambulatory Visit (HOSPITAL_COMMUNITY): Payer: Self-pay | Admitting: Physician Assistant

## 2021-03-18 DIAGNOSIS — I10 Essential (primary) hypertension: Secondary | ICD-10-CM

## 2021-03-18 DIAGNOSIS — G4733 Obstructive sleep apnea (adult) (pediatric): Secondary | ICD-10-CM | POA: Diagnosis not present

## 2021-03-22 DIAGNOSIS — M3390 Dermatopolymyositis, unspecified, organ involvement unspecified: Secondary | ICD-10-CM | POA: Diagnosis not present

## 2021-03-22 DIAGNOSIS — Z7952 Long term (current) use of systemic steroids: Secondary | ICD-10-CM | POA: Diagnosis not present

## 2021-03-22 DIAGNOSIS — M1990 Unspecified osteoarthritis, unspecified site: Secondary | ICD-10-CM | POA: Diagnosis not present

## 2021-03-22 DIAGNOSIS — M25531 Pain in right wrist: Secondary | ICD-10-CM | POA: Diagnosis not present

## 2021-03-22 DIAGNOSIS — M150 Primary generalized (osteo)arthritis: Secondary | ICD-10-CM | POA: Diagnosis not present

## 2021-03-22 DIAGNOSIS — M109 Gout, unspecified: Secondary | ICD-10-CM | POA: Diagnosis not present

## 2021-03-22 DIAGNOSIS — M549 Dorsalgia, unspecified: Secondary | ICD-10-CM | POA: Diagnosis not present

## 2021-03-22 DIAGNOSIS — M5136 Other intervertebral disc degeneration, lumbar region: Secondary | ICD-10-CM | POA: Diagnosis not present

## 2021-03-22 DIAGNOSIS — Z79899 Other long term (current) drug therapy: Secondary | ICD-10-CM | POA: Diagnosis not present

## 2021-03-22 DIAGNOSIS — M810 Age-related osteoporosis without current pathological fracture: Secondary | ICD-10-CM | POA: Diagnosis not present

## 2021-03-22 DIAGNOSIS — M25569 Pain in unspecified knee: Secondary | ICD-10-CM | POA: Diagnosis not present

## 2021-04-05 ENCOUNTER — Ambulatory Visit (INDEPENDENT_AMBULATORY_CARE_PROVIDER_SITE_OTHER): Payer: Medicare Other

## 2021-04-05 DIAGNOSIS — I480 Paroxysmal atrial fibrillation: Secondary | ICD-10-CM | POA: Diagnosis not present

## 2021-04-05 LAB — CUP PACEART REMOTE DEVICE CHECK
Date Time Interrogation Session: 20221017012220
Implantable Pulse Generator Implant Date: 20200710

## 2021-04-08 DIAGNOSIS — Z1231 Encounter for screening mammogram for malignant neoplasm of breast: Secondary | ICD-10-CM | POA: Diagnosis not present

## 2021-04-13 NOTE — Progress Notes (Signed)
[facility] Summary Report / Loop Recorder 

## 2021-04-20 DIAGNOSIS — C44629 Squamous cell carcinoma of skin of left upper limb, including shoulder: Secondary | ICD-10-CM | POA: Diagnosis not present

## 2021-05-31 ENCOUNTER — Telehealth: Payer: Self-pay | Admitting: Internal Medicine

## 2021-05-31 NOTE — Telephone Encounter (Signed)
Pt c/o BP issue: STAT if pt c/o blurred vision, one-sided weakness or slurred speech  1. What are your last 5 BP readings?  05/31/21 197/133 HR 84 05/30/21 216/134 HR 77  2. Are you having any other symptoms (ex. Dizziness, headache, blurred vision, passed out)? Cough. Swelling in arms and legs, and headache   3. What is your BP issue? Hypertension has been occurring since 05/28/21.  An appt has been scheduled for Friday with Oda Kilts in regards to this.

## 2021-05-31 NOTE — Telephone Encounter (Signed)
Recommended by Dr. Rayann Heman to contact PCP with all the different symptoms. He feels like elevated BP is stemming from another primary problem and to contact Dr. Theda Sers. Also advised not to ever take extra Sotalol without doctor recommendation.  Verbalized understanding and agreement.

## 2021-05-31 NOTE — Telephone Encounter (Signed)
Patient called in complaining of high BP, cough, swelling and headache. Took covid test 05/30/21 which was neg. Patient feels like her BP was running high all last week but does not have the dates that the BP correlate with. Some recent readings, 192/120 74, 220/123 78, 216/134 77, 197/133 84, 178/106 78, current 178/104 78. The patient called her grandson who is a EMT last night because she did not want to go to the ER. He advised her to take extra 1/2 tab of Losartan, Metoprolol Succinate, and Sotalol on 12/18. Today she called him back advised to take extra 1 tab of Losartan, Metoprolol, Sotalol, Lasix and to call her cardiologist. Current weight is 224 pounds. The patient also states she feels like she is wheezing. She is having Diarrhea as well.

## 2021-06-02 DIAGNOSIS — R6889 Other general symptoms and signs: Secondary | ICD-10-CM | POA: Diagnosis not present

## 2021-06-02 DIAGNOSIS — J988 Other specified respiratory disorders: Secondary | ICD-10-CM | POA: Diagnosis not present

## 2021-06-02 DIAGNOSIS — R059 Cough, unspecified: Secondary | ICD-10-CM | POA: Diagnosis not present

## 2021-06-02 DIAGNOSIS — R197 Diarrhea, unspecified: Secondary | ICD-10-CM | POA: Diagnosis not present

## 2021-06-03 ENCOUNTER — Ambulatory Visit (INDEPENDENT_AMBULATORY_CARE_PROVIDER_SITE_OTHER): Payer: Medicare Other

## 2021-06-03 DIAGNOSIS — I480 Paroxysmal atrial fibrillation: Secondary | ICD-10-CM | POA: Diagnosis not present

## 2021-06-03 LAB — CUP PACEART REMOTE DEVICE CHECK
Date Time Interrogation Session: 20221222004020
Implantable Pulse Generator Implant Date: 20200710

## 2021-06-04 ENCOUNTER — Ambulatory Visit: Payer: Medicare Other | Admitting: Student

## 2021-06-05 ENCOUNTER — Emergency Department (HOSPITAL_COMMUNITY): Payer: Medicare Other

## 2021-06-05 ENCOUNTER — Inpatient Hospital Stay (HOSPITAL_COMMUNITY): Payer: Medicare Other

## 2021-06-05 ENCOUNTER — Other Ambulatory Visit: Payer: Self-pay

## 2021-06-05 ENCOUNTER — Inpatient Hospital Stay (HOSPITAL_COMMUNITY)
Admission: EM | Admit: 2021-06-05 | Discharge: 2021-06-11 | DRG: 193 | Disposition: A | Discharge status: Home Health | Payer: Medicare Other | Attending: Family Medicine | Admitting: Family Medicine

## 2021-06-05 ENCOUNTER — Encounter (HOSPITAL_COMMUNITY): Payer: Self-pay | Admitting: Emergency Medicine

## 2021-06-05 DIAGNOSIS — J189 Pneumonia, unspecified organism: Secondary | ICD-10-CM | POA: Diagnosis not present

## 2021-06-05 DIAGNOSIS — Z20822 Contact with and (suspected) exposure to covid-19: Secondary | ICD-10-CM | POA: Diagnosis not present

## 2021-06-05 DIAGNOSIS — D72829 Elevated white blood cell count, unspecified: Secondary | ICD-10-CM | POA: Diagnosis present

## 2021-06-05 DIAGNOSIS — J470 Bronchiectasis with acute lower respiratory infection: Secondary | ICD-10-CM | POA: Diagnosis present

## 2021-06-05 DIAGNOSIS — I482 Chronic atrial fibrillation, unspecified: Secondary | ICD-10-CM | POA: Diagnosis not present

## 2021-06-05 DIAGNOSIS — Z8249 Family history of ischemic heart disease and other diseases of the circulatory system: Secondary | ICD-10-CM

## 2021-06-05 DIAGNOSIS — Z743 Need for continuous supervision: Secondary | ICD-10-CM | POA: Diagnosis not present

## 2021-06-05 DIAGNOSIS — I2510 Atherosclerotic heart disease of native coronary artery without angina pectoris: Secondary | ICD-10-CM | POA: Diagnosis present

## 2021-06-05 DIAGNOSIS — J471 Bronchiectasis with (acute) exacerbation: Secondary | ICD-10-CM | POA: Diagnosis not present

## 2021-06-05 DIAGNOSIS — K8010 Calculus of gallbladder with chronic cholecystitis without obstruction: Secondary | ICD-10-CM | POA: Diagnosis not present

## 2021-06-05 DIAGNOSIS — J479 Bronchiectasis, uncomplicated: Secondary | ICD-10-CM | POA: Diagnosis not present

## 2021-06-05 DIAGNOSIS — I5032 Chronic diastolic (congestive) heart failure: Secondary | ICD-10-CM | POA: Diagnosis present

## 2021-06-05 DIAGNOSIS — I480 Paroxysmal atrial fibrillation: Secondary | ICD-10-CM | POA: Diagnosis not present

## 2021-06-05 DIAGNOSIS — Z841 Family history of disorders of kidney and ureter: Secondary | ICD-10-CM

## 2021-06-05 DIAGNOSIS — K8000 Calculus of gallbladder with acute cholecystitis without obstruction: Secondary | ICD-10-CM | POA: Diagnosis not present

## 2021-06-05 DIAGNOSIS — R062 Wheezing: Secondary | ICD-10-CM | POA: Diagnosis not present

## 2021-06-05 DIAGNOSIS — I509 Heart failure, unspecified: Secondary | ICD-10-CM | POA: Diagnosis not present

## 2021-06-05 DIAGNOSIS — F05 Delirium due to known physiological condition: Secondary | ICD-10-CM | POA: Diagnosis not present

## 2021-06-05 DIAGNOSIS — Z7901 Long term (current) use of anticoagulants: Secondary | ICD-10-CM

## 2021-06-05 DIAGNOSIS — R7989 Other specified abnormal findings of blood chemistry: Secondary | ICD-10-CM

## 2021-06-05 DIAGNOSIS — K828 Other specified diseases of gallbladder: Secondary | ICD-10-CM | POA: Diagnosis not present

## 2021-06-05 DIAGNOSIS — I493 Ventricular premature depolarization: Secondary | ICD-10-CM | POA: Diagnosis present

## 2021-06-05 DIAGNOSIS — I272 Pulmonary hypertension, unspecified: Secondary | ICD-10-CM | POA: Diagnosis not present

## 2021-06-05 DIAGNOSIS — E785 Hyperlipidemia, unspecified: Secondary | ICD-10-CM | POA: Diagnosis present

## 2021-06-05 DIAGNOSIS — R109 Unspecified abdominal pain: Secondary | ICD-10-CM | POA: Diagnosis not present

## 2021-06-05 DIAGNOSIS — I110 Hypertensive heart disease with heart failure: Secondary | ICD-10-CM | POA: Diagnosis not present

## 2021-06-05 DIAGNOSIS — J9811 Atelectasis: Secondary | ICD-10-CM | POA: Diagnosis not present

## 2021-06-05 DIAGNOSIS — E8809 Other disorders of plasma-protein metabolism, not elsewhere classified: Secondary | ICD-10-CM | POA: Diagnosis present

## 2021-06-05 DIAGNOSIS — J9601 Acute respiratory failure with hypoxia: Secondary | ICD-10-CM

## 2021-06-05 DIAGNOSIS — D7589 Other specified diseases of blood and blood-forming organs: Secondary | ICD-10-CM | POA: Diagnosis present

## 2021-06-05 DIAGNOSIS — K8020 Calculus of gallbladder without cholecystitis without obstruction: Secondary | ICD-10-CM | POA: Diagnosis not present

## 2021-06-05 DIAGNOSIS — Z6841 Body Mass Index (BMI) 40.0 and over, adult: Secondary | ICD-10-CM | POA: Diagnosis not present

## 2021-06-05 DIAGNOSIS — K760 Fatty (change of) liver, not elsewhere classified: Secondary | ICD-10-CM | POA: Diagnosis not present

## 2021-06-05 DIAGNOSIS — R410 Disorientation, unspecified: Secondary | ICD-10-CM | POA: Diagnosis not present

## 2021-06-05 DIAGNOSIS — R0689 Other abnormalities of breathing: Secondary | ICD-10-CM | POA: Diagnosis not present

## 2021-06-05 DIAGNOSIS — I160 Hypertensive urgency: Secondary | ICD-10-CM | POA: Diagnosis not present

## 2021-06-05 DIAGNOSIS — R0602 Shortness of breath: Secondary | ICD-10-CM

## 2021-06-05 DIAGNOSIS — E039 Hypothyroidism, unspecified: Secondary | ICD-10-CM | POA: Diagnosis not present

## 2021-06-05 DIAGNOSIS — N281 Cyst of kidney, acquired: Secondary | ICD-10-CM | POA: Diagnosis not present

## 2021-06-05 DIAGNOSIS — I248 Other forms of acute ischemic heart disease: Secondary | ICD-10-CM | POA: Diagnosis present

## 2021-06-05 DIAGNOSIS — R778 Other specified abnormalities of plasma proteins: Secondary | ICD-10-CM | POA: Diagnosis present

## 2021-06-05 DIAGNOSIS — Z01810 Encounter for preprocedural cardiovascular examination: Secondary | ICD-10-CM | POA: Diagnosis not present

## 2021-06-05 DIAGNOSIS — Z79899 Other long term (current) drug therapy: Secondary | ICD-10-CM

## 2021-06-05 DIAGNOSIS — I700 Atherosclerosis of aorta: Secondary | ICD-10-CM | POA: Diagnosis not present

## 2021-06-05 DIAGNOSIS — Z825 Family history of asthma and other chronic lower respiratory diseases: Secondary | ICD-10-CM

## 2021-06-05 DIAGNOSIS — R6889 Other general symptoms and signs: Secondary | ICD-10-CM | POA: Diagnosis not present

## 2021-06-05 DIAGNOSIS — E876 Hypokalemia: Secondary | ICD-10-CM

## 2021-06-05 DIAGNOSIS — Z800 Family history of malignant neoplasm of digestive organs: Secondary | ICD-10-CM

## 2021-06-05 DIAGNOSIS — I517 Cardiomegaly: Secondary | ICD-10-CM | POA: Diagnosis not present

## 2021-06-05 LAB — CBC WITH DIFFERENTIAL/PLATELET
Abs Immature Granulocytes: 0.04 10*3/uL (ref 0.00–0.07)
Basophils Absolute: 0 10*3/uL (ref 0.0–0.1)
Basophils Relative: 0 %
Eosinophils Absolute: 0.3 10*3/uL (ref 0.0–0.5)
Eosinophils Relative: 2 %
HCT: 46.8 % — ABNORMAL HIGH (ref 36.0–46.0)
Hemoglobin: 15.1 g/dL — ABNORMAL HIGH (ref 12.0–15.0)
Immature Granulocytes: 0 %
Lymphocytes Relative: 16 %
Lymphs Abs: 1.8 10*3/uL (ref 0.7–4.0)
MCH: 33.9 pg (ref 26.0–34.0)
MCHC: 32.3 g/dL (ref 30.0–36.0)
MCV: 104.9 fL — ABNORMAL HIGH (ref 80.0–100.0)
Monocytes Absolute: 0.9 10*3/uL (ref 0.1–1.0)
Monocytes Relative: 8 %
Neutro Abs: 8.3 10*3/uL — ABNORMAL HIGH (ref 1.7–7.7)
Neutrophils Relative %: 74 %
Platelets: 171 10*3/uL (ref 150–400)
RBC: 4.46 MIL/uL (ref 3.87–5.11)
RDW: 13.2 % (ref 11.5–15.5)
WBC: 11.4 10*3/uL — ABNORMAL HIGH (ref 4.0–10.5)
nRBC: 0 % (ref 0.0–0.2)

## 2021-06-05 LAB — BRAIN NATRIURETIC PEPTIDE: B Natriuretic Peptide: 303.7 pg/mL — ABNORMAL HIGH (ref 0.0–100.0)

## 2021-06-05 LAB — URINALYSIS, ROUTINE W REFLEX MICROSCOPIC
Bilirubin Urine: NEGATIVE
Glucose, UA: NEGATIVE mg/dL
Ketones, ur: NEGATIVE mg/dL
Nitrite: NEGATIVE
Protein, ur: NEGATIVE mg/dL
Specific Gravity, Urine: 1.01 (ref 1.005–1.030)
pH: 7 (ref 5.0–8.0)

## 2021-06-05 LAB — BASIC METABOLIC PANEL
Anion gap: 9 (ref 5–15)
BUN: 22 mg/dL (ref 8–23)
CO2: 28 mmol/L (ref 22–32)
Calcium: 9.4 mg/dL (ref 8.9–10.3)
Chloride: 101 mmol/L (ref 98–111)
Creatinine, Ser: 1.01 mg/dL — ABNORMAL HIGH (ref 0.44–1.00)
GFR, Estimated: 58 mL/min — ABNORMAL LOW (ref >60)
Glucose, Bld: 101 mg/dL — ABNORMAL HIGH (ref 70–99)
Potassium: 5.1 mmol/L (ref 3.5–5.1)
Sodium: 138 mmol/L (ref 135–145)

## 2021-06-05 LAB — TROPONIN I (HIGH SENSITIVITY)
Troponin I (High Sensitivity): 22 ng/L — ABNORMAL HIGH (ref <18)
Troponin I (High Sensitivity): 56 ng/L — ABNORMAL HIGH (ref <18)
Troponin I (High Sensitivity): 56 ng/L — ABNORMAL HIGH (ref <18)
Troponin I (High Sensitivity): 56 ng/L — ABNORMAL HIGH (ref <18)

## 2021-06-05 LAB — HEPATIC FUNCTION PANEL
ALT: 26 U/L (ref 0–44)
AST: 40 U/L (ref 15–41)
Albumin: 3.1 g/dL — ABNORMAL LOW (ref 3.5–5.0)
Alkaline Phosphatase: 68 U/L (ref 38–126)
Bilirubin, Direct: 0.2 mg/dL (ref 0.0–0.2)
Indirect Bilirubin: 1.1 mg/dL — ABNORMAL HIGH (ref 0.3–0.9)
Total Bilirubin: 1.3 mg/dL — ABNORMAL HIGH (ref 0.3–1.2)
Total Protein: 6.4 g/dL — ABNORMAL LOW (ref 6.5–8.1)

## 2021-06-05 LAB — RESP PANEL BY RT-PCR (FLU A&B, COVID) ARPGX2
Influenza A by PCR: NEGATIVE
Influenza B by PCR: NEGATIVE
SARS Coronavirus 2 by RT PCR: NEGATIVE

## 2021-06-05 LAB — URINALYSIS, MICROSCOPIC (REFLEX): Bacteria, UA: NONE SEEN

## 2021-06-05 LAB — STREP PNEUMONIAE URINARY ANTIGEN: Strep Pneumo Urinary Antigen: NEGATIVE

## 2021-06-05 LAB — TSH: TSH: 0.424 u[IU]/mL (ref 0.350–4.500)

## 2021-06-05 LAB — PROCALCITONIN: Procalcitonin: <0.1 ng/mL

## 2021-06-05 MED ORDER — LEVOTHYROXINE SODIUM 88 MCG PO TABS
88.0000 ug | ORAL_TABLET | Freq: Every day | ORAL | Status: DC
  Administered 2021-06-06 – 2021-06-11 (×6): 88 ug via ORAL
  Filled 2021-06-05 (×8): qty 1

## 2021-06-05 MED ORDER — FUROSEMIDE 10 MG/ML IJ SOLN
80.0000 mg | Freq: Once | INTRAMUSCULAR | Status: AC
  Administered 2021-06-05: 06:00:00 80 mg via INTRAVENOUS
  Filled 2021-06-05: qty 8

## 2021-06-05 MED ORDER — ALBUTEROL SULFATE (2.5 MG/3ML) 0.083% IN NEBU
2.5000 mg | INHALATION_SOLUTION | RESPIRATORY_TRACT | Status: DC | PRN
  Administered 2021-06-06 – 2021-06-07 (×2): 2.5 mg via RESPIRATORY_TRACT
  Filled 2021-06-05 (×2): qty 3

## 2021-06-05 MED ORDER — PREDNISONE 20 MG PO TABS
40.0000 mg | ORAL_TABLET | Freq: Every day | ORAL | Status: DC
  Administered 2021-06-05 – 2021-06-09 (×5): 40 mg via ORAL
  Filled 2021-06-05 (×5): qty 2

## 2021-06-05 MED ORDER — FUROSEMIDE 40 MG PO TABS
40.0000 mg | ORAL_TABLET | Freq: Two times a day (BID) | ORAL | Status: DC
  Administered 2021-06-06 – 2021-06-11 (×10): 40 mg via ORAL
  Filled 2021-06-05 (×10): qty 1

## 2021-06-05 MED ORDER — SODIUM CHLORIDE 0.9 % IV SOLN
100.0000 mg | Freq: Two times a day (BID) | INTRAVENOUS | Status: AC
  Administered 2021-06-06 – 2021-06-09 (×8): 100 mg via INTRAVENOUS
  Filled 2021-06-05 (×10): qty 100

## 2021-06-05 MED ORDER — LEVOFLOXACIN IN D5W 750 MG/150ML IV SOLN
750.0000 mg | Freq: Once | INTRAVENOUS | Status: AC
  Administered 2021-06-05: 11:00:00 750 mg via INTRAVENOUS
  Filled 2021-06-05: qty 150

## 2021-06-05 MED ORDER — SODIUM CHLORIDE 0.9 % IV SOLN: 2.0000 g | INTRAVENOUS | Status: DC

## 2021-06-05 MED ORDER — GUAIFENESIN ER 600 MG PO TB12
600.0000 mg | ORAL_TABLET | Freq: Two times a day (BID) | ORAL | Status: DC
  Administered 2021-06-05 – 2021-06-11 (×13): 600 mg via ORAL
  Filled 2021-06-05 (×13): qty 1

## 2021-06-05 MED ORDER — ENSURE ENLIVE PO LIQD
237.0000 mL | Freq: Two times a day (BID) | ORAL | Status: DC
  Administered 2021-06-06 – 2021-06-10 (×5): 237 mL via ORAL

## 2021-06-05 MED ORDER — METOPROLOL SUCCINATE ER 25 MG PO TB24
37.5000 mg | ORAL_TABLET | Freq: Two times a day (BID) | ORAL | Status: DC
  Administered 2021-06-05 – 2021-06-11 (×13): 37.5 mg via ORAL
  Filled 2021-06-05 (×13): qty 2

## 2021-06-05 MED ORDER — SOTALOL HCL 80 MG PO TABS
80.0000 mg | ORAL_TABLET | Freq: Two times a day (BID) | ORAL | Status: DC
  Administered 2021-06-05 – 2021-06-11 (×13): 80 mg via ORAL
  Filled 2021-06-05 (×16): qty 1

## 2021-06-05 MED ORDER — ONDANSETRON HCL 4 MG PO TABS: 4.0000 mg | ORAL_TABLET | Freq: Four times a day (QID) | ORAL | Status: DC | PRN

## 2021-06-05 MED ORDER — SODIUM CHLORIDE 0.9% FLUSH
3.0000 mL | Freq: Two times a day (BID) | INTRAVENOUS | Status: DC
  Administered 2021-06-05 – 2021-06-11 (×8): 3 mL via INTRAVENOUS

## 2021-06-05 MED ORDER — ALUM & MAG HYDROXIDE-SIMETH 200-200-20 MG/5ML PO SUSP: 30.0000 mL | ORAL | Status: DC | PRN

## 2021-06-05 MED ORDER — ACETAMINOPHEN 650 MG RE SUPP: 650.0000 mg | Freq: Four times a day (QID) | RECTAL | Status: DC | PRN

## 2021-06-05 MED ORDER — POTASSIUM CHLORIDE CRYS ER 20 MEQ PO TBCR: 20.0000 meq | EXTENDED_RELEASE_TABLET | Freq: Three times a day (TID) | ORAL | Status: DC

## 2021-06-05 MED ORDER — ONDANSETRON HCL 4 MG/2ML IJ SOLN
4.0000 mg | Freq: Four times a day (QID) | INTRAMUSCULAR | Status: DC | PRN
  Administered 2021-06-05: 16:00:00 4 mg via INTRAVENOUS
  Filled 2021-06-05: qty 2

## 2021-06-05 MED ORDER — FUROSEMIDE 10 MG/ML IJ SOLN
40.0000 mg | Freq: Once | INTRAMUSCULAR | Status: AC
  Administered 2021-06-05: 19:00:00 40 mg via INTRAVENOUS
  Filled 2021-06-05: qty 4

## 2021-06-05 MED ORDER — IPRATROPIUM-ALBUTEROL 0.5-2.5 (3) MG/3ML IN SOLN
3.0000 mL | Freq: Once | RESPIRATORY_TRACT | Status: AC
  Administered 2021-06-05: 09:00:00 3 mL via RESPIRATORY_TRACT
  Filled 2021-06-05: qty 3

## 2021-06-05 MED ORDER — DEXTROMETHORPHAN POLISTIREX ER 30 MG/5ML PO SUER
60.0000 mg | ORAL | Status: DC | PRN
  Filled 2021-06-05: qty 10

## 2021-06-05 MED ORDER — APIXABAN 5 MG PO TABS
5.0000 mg | ORAL_TABLET | Freq: Two times a day (BID) | ORAL | Status: DC
  Administered 2021-06-05 – 2021-06-06 (×3): 5 mg via ORAL
  Filled 2021-06-05 (×3): qty 1

## 2021-06-05 MED ORDER — ACETAMINOPHEN 325 MG PO TABS
650.0000 mg | ORAL_TABLET | Freq: Four times a day (QID) | ORAL | Status: DC | PRN
  Administered 2021-06-05 – 2021-06-07 (×2): 650 mg via ORAL
  Filled 2021-06-05 (×2): qty 2

## 2021-06-05 MED ORDER — SACCHAROMYCES BOULARDII 250 MG PO CAPS
250.0000 mg | ORAL_CAPSULE | Freq: Two times a day (BID) | ORAL | Status: DC
  Administered 2021-06-05 – 2021-06-11 (×12): 250 mg via ORAL
  Filled 2021-06-05 (×14): qty 1

## 2021-06-05 MED ORDER — ROSUVASTATIN CALCIUM 5 MG PO TABS
5.0000 mg | ORAL_TABLET | ORAL | Status: DC
  Administered 2021-06-05 – 2021-06-11 (×4): 5 mg via ORAL
  Filled 2021-06-05 (×5): qty 1

## 2021-06-05 MED ORDER — SODIUM CHLORIDE 0.9 % IV SOLN
2.0000 g | INTRAVENOUS | Status: AC
  Administered 2021-06-06 – 2021-06-09 (×4): 2 g via INTRAVENOUS
  Filled 2021-06-05 (×4): qty 20

## 2021-06-05 MED ORDER — LOSARTAN POTASSIUM 25 MG PO TABS
25.0000 mg | ORAL_TABLET | Freq: Every day | ORAL | Status: DC
  Administered 2021-06-05 – 2021-06-06 (×2): 25 mg via ORAL
  Filled 2021-06-05 (×3): qty 1

## 2021-06-05 MED ORDER — ALLOPURINOL 100 MG PO TABS
100.0000 mg | ORAL_TABLET | Freq: Two times a day (BID) | ORAL | Status: DC
  Administered 2021-06-05 – 2021-06-11 (×12): 100 mg via ORAL
  Filled 2021-06-05 (×13): qty 1

## 2021-06-05 MED ORDER — FAMOTIDINE 20 MG PO TABS
20.0000 mg | ORAL_TABLET | Freq: Every day | ORAL | Status: DC
  Administered 2021-06-05 – 2021-06-10 (×6): 20 mg via ORAL
  Filled 2021-06-05 (×6): qty 1

## 2021-06-05 NOTE — ED Notes (Signed)
PT to US

## 2021-06-05 NOTE — H&P (Addendum)
History and Physical    [name] JSE:[id number] DOB: 11/02/1945 DOA: 06/05/2021  Referring MD/NP/PA: Isla Pence, MD PCP: [name], DO  Patient coming from: Home via EMS  Chief Complaint: Cough  I have personally briefly reviewed patient's old medical records in [place]   HPI: [name] is a 75 y.o. female with medical history significant of HTN, hyperlipidemia, CAD, diastolic CHF, A. fib on chronic anticoagulation, hypothyroidism, and morbid obesity who presents with complaints of cough and shortness of breath.  Symptoms started approximately 3 weeks ago with complaints of congestion.  Her cough has been nonproductive.  Associated symptoms included some lower abdominal discomfort intermittent episodes of diarrhea, and mild lower extremity swelling which she feels she may have some excess fluid on her., Denies having any fever, nausea, vomiting, or chest pain.  Her husband has been coughing as well, and both have tested negative for COVID-19 on 2 separate occasions.  She had notified her PCP of symptoms and had been sent in a prescription for Delsym which helped some with cough.  She was also started on azithromycin and Diflucan 2 days ago.  Last night however patient began coughing and was unable to catch her breath with chest discomfort for which EMS ended up being called.  Prior to that patient also reported that her blood pressures were elevated into the 230s.  This time patient denies having any chest pain.  She admits that she may have also missed some of her diuretic doses during this time when she has been not feeling well.  In route with EMS O2 saturations as low as 83% on room air.  ED Course: Upon admission to the emergency department patient was seen to be afebrile, respirations 19-32, blood pressure elevated to 210/116, and O2 saturations initially maintained on 8 L of nasal cannula oxygen.  Labs significant for WBC 11.4, hemoglobin 15.1 BUN 22, creatinine 1.01,  glucose 101, BNP 303.7, and high-sensitivity troponin 22-> 56.  CT scan of the chest noted widespread progression of bronchiectasis since 2017 with bilateral lower lobe nodular and patchy opacity compatible with active infection superimposed on chronic lung disease with retained or aspirated secretions of the right mainstem bronchus.  Patient has been given Lasix 80 mg IV, DuoNeb breathing treatment, and Levaquin.  Review of Systems  Constitutional:  Positive for malaise/fatigue. Negative for fever.  HENT:  Positive for congestion.   Eyes:  Negative for photophobia and pain.  Respiratory:  Positive for cough, shortness of breath and wheezing. Negative for sputum production.   Cardiovascular:  Positive for leg swelling. Negative for chest pain.  Gastrointestinal:  Positive for abdominal pain and diarrhea.  Genitourinary:  Negative for dysuria and hematuria.  Musculoskeletal:  Negative for falls.  Skin:  Negative for itching.  Neurological:  Negative for focal weakness and loss of consciousness.  Psychiatric/Behavioral:  Negative for substance abuse.    Past Medical History:  Diagnosis Date   Anemia    Bilateral leg edema    C. difficile enteritis    CAD (coronary artery disease)    a. LHC 03/2006: EF 65%, mid LAD 40-50%, ostial D1 70-80%, normal circumflex, normal RCA;  b. Lexiscan Myoview 09/2010: No ischemia or scar, EF 75%;  c. Lex MV 5/14:  Normal, no ischemia, EF 71% // Myoview 08/2018: EF 43, normal perfusion; Intermediate Risk due to low EF (Echo 07/2018 with normal EF)    Cataract    bilateral-had surgery   Cellulitis of right leg  Hx - resolved   Chronic diastolic CHF (congestive heart failure) ([place])    Echo 8/14:  Mild LVH, EF 55-65%, normal wall motion, Tr AI, mild MR, mod TR, PASP 45 // 12/2014 Echo: EF 60-65%, nl wall motion, mildly dil RA/LA, PASP 31mHg.// Echo 07/2018: EF 60-65, mild LVH, mod diastolic dysfunction, normal RVSF, severe LAE, mod MAC, mild MR, mild TR     Continuous urine leakage    Dermatomyositis (HCC)    Deviated septum    Essential hypertension    Family history of adverse reaction to anesthesia    .' MY SON IS A DIFFICULT INTUBATION"   Gall stones    a. 08/2014 Abd U/S:  Large gallstone measuring 4.2 cm.    GERD (gastroesophageal reflux disease)    Gout    Hiatal hernia    History of pancreatitis    History of PFTs    a. PFTs 10/2012: normal.    Hyperlipidemia    Hypothyroidism    Internal hemorrhoids    Morbid obesity ([facility]    Multifocal atrial tachycardia ([facility] 08/10/2018   Noted during admission in 07/2018   Osteoarthritis    spine hips knees   PAF (paroxysmal atrial fibrillation) ([facility]    a. CHA2DS2VASc = 5-->poor OAC candidate 2/2 falls. No problems now per patient 03/2016   Pneumonia    Seizures ([facility]    as a baby   Shingles    Hx   Sleep apnea    Does not use CPAP   Spinal cord compression ([facility] 02/21/2014    Past Surgical History:  Procedure Laterality Date   CATARACT EXTRACTION, BILATERAL Bilateral    COLONOSCOPY  03/2011   Implantable loop recorder placement  12/21/2018   Medtronic Reveal LWhalanmodel [id number] (SN [id number]) implanted by Dr ARayann Hemanin office for AF management   NASAL SEPTUM SURGERY     TOTAL ABDOMINAL HYSTERECTOMY     VERTEBROPLASTY  Sept 7 and May 30, 2014   x 2, T11/T12   WISDOM TOOTH EXTRACTION       reports that she has never smoked. She has never used smokeless tobacco. She reports that she does not drink alcohol and does not use drugs.  Allergies  Allergen Reactions   Butoconazole Itching, Rash and Other (See Comments)    'Redness and swelling feverish ' 'worse symptoms '   Keflex [Cephalexin] Diarrhea   Lipitor [Atorvastatin Calcium] Other (See Comments)    'Muscle weakness'     Cortisone     This medication causes confusion   Hydrocodone Other (See Comments)    Family History  Problem Relation Age of Onset   Colon cancer Mother        mets, spot on lungs and spine    Coronary artery disease Father    Emphysema Father    Heart attack Brother        x 2   Colon cancer Paternal Uncle    Kidney disease Brother    Hypertension Brother    Stroke Neg Hx    Esophageal cancer Neg Hx     Prior to Admission medications   Medication Sig Start Date End Date Taking? Authorizing Provider  acetaminophen (TYLENOL) 500 MG tablet Take 500-1,000 mg by mouth every 6 (six) hours as needed for mild pain or fever.    Yes [provider]  alendronate (FOSAMAX) 70 MG tablet Take 1 tablet by mouth every Tuesday.  04/01/15  Yes [provider]  allopurinol (ZYLOPRIM) 100 MG tablet  Take 100 mg by mouth 2 (two) times daily.  01/14/16  Yes [provider]  aluminum hydroxide-magnesium carbonate (GAVISCON) 95-358 MG/15ML SUSP Take 15 mLs by mouth as needed for indigestion or heartburn.   Yes [provider]  azithromycin (ZITHROMAX) 250 MG tablet Take 250 mg by mouth See admin instructions. 500 mg day 1 250 mg day 2-5 06/02/21  Yes [provider]  CALCIUM PO Take 500 mg by mouth daily.   Yes [provider]  Cholecalciferol (VITAMIN D3) 2000 UNITS TABS Take 2,000 Units by mouth daily.   Yes [provider]  dextromethorphan (DELSYM) 30 MG/5ML liquid Take 60 mg by mouth as needed for cough.   Yes [provider]  ELIQUIS 5 MG TABS tablet TAKE 1 TABLET BY MOUTH TWICE A DAY Patient taking differently: Take 5 mg by mouth 2 (two) times daily. 02/16/21  Yes Allred, Jeneen Rinks, MD  famotidine (PEPCID) 20 MG tablet Take 20 mg by mouth at bedtime.   Yes [provider]  fluconazole (DIFLUCAN) 150 MG tablet Take 150 mg by mouth See admin instructions. 1 tablet at the beginning of antibiotic course  1 tablet at the end of antibiotic course 06/02/21  Yes [provider]  folic acid (FOLVITE) 1 MG tablet Take 1 mg by mouth 4 (four) times daily.   Yes [provider]  furosemide (LASIX) 40 MG tablet Take 1  tablet (40 mg total) by mouth 2 (two) times daily. 08/18/15  Yes [name], [name], MD  levothyroxine (SYNTHROID) 88 MCG tablet Take 88 mcg by mouth daily.  07/07/17  Yes [provider]  loperamide (IMODIUM A-D) 2 MG tablet Take 2 mg by mouth 4 (four) times daily as needed for diarrhea or loose stools.   Yes [provider]  losartan (COZAAR) 25 MG tablet TAKE 1 TABLET BY MOUTH  DAILY Patient taking differently: Take 25 mg by mouth daily. 03/18/21  Yes Allred, Jeneen Rinks, MD  methotrexate (RHEUMATREX) 2.5 MG tablet Take 12.5 mg by mouth every Wednesday.  09/17/14  Yes [provider]  metoprolol succinate (TOPROL-XL) 25 MG 24 hr tablet TAKE 1 AND 1/2 TABLETS BY  MOUTH TWICE DAILY Patient taking differently: Take 37.5 mg by mouth in the morning and at bedtime. 11/11/20  Yes Allred, Jeneen Rinks, MD  Multiple Vitamin (MULTIVITAMIN) tablet Take 1 tablet by mouth daily.   Yes [provider]  OVER THE COUNTER MEDICATION Place 1 drop into both eyes daily as needed (for dry eyes. Advanced eye relief).   Yes [provider]  phenylephrine-shark liver oil-mineral oil-petrolatum (PREPARATION H) 0.25-3-14-71.9 % rectal ointment Place 1 application rectally as needed for hemorrhoids.   Yes [provider]  potassium chloride SA (KLOR-CON M20) 20 MEQ tablet Take 1 tablet (20 mEq total) by mouth 3 (three) times daily. 08/18/15  Yes [name], [name], MD  predniSONE (DELTASONE) 5 MG tablet Take 5 mg by mouth daily with breakfast.   Yes [provider]  rosuvastatin (CRESTOR) 5 MG tablet Take 5 mg by mouth every other day.    Yes [provider]  saccharomyces boulardii (FLORASTOR) 250 MG capsule Take 250 mg by mouth 2 (two) times daily.   Yes [provider]  sotalol (BETAPACE) 80 MG tablet TAKE 1 TABLET BY MOUTH  TWICE DAILY Patient taking differently: Take 80 mg by mouth 2 (two) times daily. 11/11/20  Yes Allred, Jeneen Rinks, MD  vitamin B-12  (CYANOCOBALAMIN) 1000 MCG tablet Take 1,000 mcg by mouth daily.  Yes [provider]  albuterol (VENTOLIN HFA) 108 (90 Base) MCG/ACT inhaler albuterol sulfate HFA 90 mcg/actuation aerosol inhaler  INHALE 1-2 PUFFS BY MOUTH EVERY 6 HOURS AS NEEDED FOR WHEEZE OR SHORTNESS OF BREATH Patient not taking: Reported on 06/05/2021    [provider]  dicyclomine (BENTYL) 10 MG capsule Take 1 capsule (10 mg total) by mouth 4 (four) times daily -  before meals and at bedtime. Patient not taking: Reported on 06/05/2021 10/27/20   [name], MD    Physical Exam:  Constitutional: Elderly female who appears to be acutely ill Vitals:   06/05/21 0845 06/05/21 0900 06/05/21 0915 06/05/21 1015  BP: (!) 175/98 (!) 167/102 (!) 181/133 119/78  Pulse: 81 77 83 81  Resp: (!) 23 (!) 26 (!) 24 (!) 24  Temp:      TempSrc:      SpO2: 100% 99% 100% 99%   Eyes: PERRL, lids and conjunctivae normal ENMT: Mucous membranes are moist. Posterior pharynx clear of any exudate or lesions.  Neck: normal, supple, no masses, no thyromegaly.  No significant JVD appreciated at this time. Respiratory: Tachypneic with mild intermittent wheeze appreciated.  O2 saturations currently maintained on 2 L nasal cannula oxygen. Cardiovascular: Regular rate and rhythm.  Trace lower extremity edema. 2+ pedal pulses. No carotid bruits.  Abdomen: No significant no tenderness, no masses palpated. No hepatosplenomegaly. Bowel sounds positive.  Musculoskeletal: no clubbing / cyanosis. No joint deformity upper and lower extremities. Good ROM, no contractures. Normal muscle tone.  Skin: mild pink hue to lower extremities without increased warmth noted or signs of drainage. Neurologic: CN 2-12 grossly intact. Sensation intact, DTR normal. Strength 5/5 in all 4.  Psychiatric: Normal judgment and insight. Alert and oriented x 3. Normal mood.     Labs on Admission: I have personally reviewed following labs and imaging  studies  CBC: Recent Labs  Lab 06/05/21 0233  WBC 11.4*  NEUTROABS 8.3*  HGB 15.1*  HCT 46.8*  MCV 104.9*  PLT 038   Basic Metabolic Panel: Recent Labs  Lab 06/05/21 0233  NA 138  K 5.1  CL 101  CO2 28  GLUCOSE 101*  BUN 22  CREATININE 1.01*  CALCIUM 9.4   GFR: CrCl cannot be calculated (Unknown ideal weight.). Liver Function Tests: No results for input(s): AST, ALT, ALKPHOS, BILITOT, PROT, ALBUMIN in the last 168 hours. No results for input(s): LIPASE, AMYLASE in the last 168 hours. No results for input(s): AMMONIA in the last 168 hours. Coagulation Profile: No results for input(s): INR, PROTIME in the last 168 hours. Cardiac Enzymes: No results for input(s): CKTOTAL, CKMB, CKMBINDEX, TROPONINI in the last 168 hours. BNP (last 3 results) Recent Labs    03/15/21 1227  PROBNP 822*   HbA1C: No results for input(s): HGBA1C in the last 72 hours. CBG: No results for input(s): GLUCAP in the last 168 hours. Lipid Profile: No results for input(s): CHOL, HDL, LDLCALC, TRIG, CHOLHDL, LDLDIRECT in the last 72 hours. Thyroid Function Tests: No results for input(s): TSH, T4TOTAL, FREET4, T3FREE, THYROIDAB in the last 72 hours. Anemia Panel: No results for input(s): VITAMINB12, FOLATE, FERRITIN, TIBC, IRON, RETICCTPCT in the last 72 hours. Urine analysis:    Component Value Date/Time   COLORURINE YELLOW 06/05/2021 0912   APPEARANCEUR CLEAR 06/05/2021 0912   LABSPEC 1.010 06/05/2021 0912   PHURINE 7.0 06/05/2021 0912   GLUCOSEU NEGATIVE 06/05/2021 0912   HGBUR TRACE (A) 06/05/2021 0912   BILIRUBINUR NEGATIVE 06/05/2021 0912  KETONESUR NEGATIVE 06/05/2021 0912   PROTEINUR NEGATIVE 06/05/2021 0912   NITRITE NEGATIVE 06/05/2021 0912   LEUKOCYTESUR SMALL (A) 06/05/2021 0912   Sepsis Labs: Recent Results (from the past 240 hour(s))  Resp Panel by RT-PCR (Flu A&B, Covid) Nasopharyngeal Swab     Status: None   Collection Time: 06/05/21  2:39 AM   Specimen:  Nasopharyngeal Swab; Nasopharyngeal(NP) swabs in vial transport medium  Result Value Ref Range Status   SARS Coronavirus 2 by RT PCR NEGATIVE NEGATIVE Final    Comment: (NOTE) SARS-CoV-2 target nucleic acids are NOT DETECTED.  The SARS-CoV-2 RNA is generally detectable in upper respiratory specimens during the acute phase of infection. The lowest concentration of SARS-CoV-2 viral copies this assay can detect is 138 copies/mL. A negative result does not preclude SARS-Cov-2 infection and should not be used as the sole basis for treatment or other patient management decisions. A negative result may occur with  improper specimen collection/handling, submission of specimen other than nasopharyngeal swab, presence of viral mutation(s) within the areas targeted by this assay, and inadequate number of viral copies(<138 copies/mL). A negative result must be combined with clinical observations, patient history, and epidemiological information. The expected result is Negative.  Fact Sheet for Patients:  EntrepreneurPulse.com.au  Fact Sheet for Healthcare Providers:  IncredibleEmployment.be  This test is no t yet approved or cleared by the [place] FDA and  has been authorized for detection and/or diagnosis of SARS-CoV-2 by FDA under an Emergency Use Authorization (EUA). This EUA will remain  in effect (meaning this test can be used) for the duration of the COVID-19 declaration under Section 564(b)(1) of the Act, 21 U.S.C.section 360bbb-3(b)(1), unless the authorization is terminated  or revoked sooner.       Influenza A by PCR NEGATIVE NEGATIVE Final   Influenza B by PCR NEGATIVE NEGATIVE Final    Comment: (NOTE) The Xpert Xpress SARS-CoV-2/FLU/RSV plus assay is intended as an aid in the diagnosis of influenza from Nasopharyngeal swab specimens and should not be used as a sole basis for treatment. Nasal washings and aspirates are unacceptable for  Xpert Xpress SARS-CoV-2/FLU/RSV testing.  Fact Sheet for Patients: EntrepreneurPulse.com.au  Fact Sheet for Healthcare Providers: IncredibleEmployment.be  This test is not yet approved or cleared by the [place] FDA and has been authorized for detection and/or diagnosis of SARS-CoV-2 by FDA under an Emergency Use Authorization (EUA). This EUA will remain in effect (meaning this test can be used) for the duration of the COVID-19 declaration under Section 564(b)(1) of the Act, 21 U.S.C. section 360bbb-3(b)(1), unless the authorization is terminated or revoked.  Performed at [facility], [place] [address] [place], [place] [address]      Radiological Exams on Admission: CT Chest Wo Contrast  Result Date: 06/05/2021 CLINICAL DATA:  75 year old female with shortness of breath. EXAM: CT CHEST WITHOUT CONTRAST TECHNIQUE: Multidetector CT imaging of the chest was performed following the standard protocol without IV contrast. COMPARISON:  Portable chest 0231 hours today.  Chest CT 08/13/2015. FINDINGS: Cardiovascular: Advanced calcified coronary artery atherosclerosis and/or stents. Prosthetic mitral valve. Mild cardiomegaly. No pericardial effusion. Mild-to-moderate Calcified aortic atherosclerosis. Mediastinum/Nodes: Superior left chest loop recorder or lead Liss ICD. No mediastinal lymphadenopathy. Lungs/Pleura: Layering retained secretions in the right mainstem bronchus and bronchus intermedius. Other major airways are patent. Bilateral bronchiectasis has progressed since 2017. Left greater than right lower lobe peribronchial nodularity and patchy opacity is superimposed on chronic lower lobe scarring. Upper lung mild tree-in-bud nodular opacity appears  more chronic and stable. No consolidation. No pleural effusion. Upper Abdomen: Large lamellated gallstone in the visible gallbladder is 37 mm diameter. No pericholecystic inflammation is evident.  Otherwise negative visible noncontrast liver, spleen, pancreas, adrenal glands, kidneys and bowel in the upper abdomen. Musculoskeletal: Chronically augmented T11 and T12 compression fractures appears stable since 2017. Underlying osteopenia. No acute osseous abnormality identified. IMPRESSION: 1. Widespread progressed Bronchiectasis since 2017. Bilateral lower lobe peribronchial nodularity and patchy opacity is compatible with active infection superimposed on chronic lung disease. Small volume of retained or aspirated secretions in the right mainstem bronchus. Consider both conventional and atypical (MAI) infectious etiology. No pleural effusion. 2. Advanced calcified coronary artery atherosclerosis. Mild cardiomegaly. Prosthetic mitral valve. Aortic Atherosclerosis (ICD10-I70.0). 3. Cholelithiasis. 4. Previously augmented T11 and T12 compression fractures. Electronically Signed   By: Genevie Ann M.D.   On: 06/05/2021 09:58   DG Chest Portable 1 View  Result Date: 06/05/2021 CLINICAL DATA:  Shortness of breath. EXAM: PORTABLE CHEST 1 VIEW COMPARISON:  08/28/2020. FINDINGS: The heart is enlarged and the mediastinal contours are within normal limits. There is chronic elevation of the left diaphragm and low lung volumes with mild atelectasis at the lung bases. No consolidation, effusion, or pneumothorax. A loop recorder device is present over the left chest. No acute osseous abnormality. IMPRESSION: Cardiomegaly with no acute process Electronically Signed   By: Brett Fairy M.D.   On: 06/05/2021 02:45    EKG: Independently reviewed.  Sinus rhythm at 77 bpm with significant background artifact QTC 457  Assessment/Plan Acute respiratory failure with hypoxia secondary pneumonia  bronchiectasis: Patient presents after being found to be hypoxic down to 83% on room air initially requiring 8 L nasal cannula oxygen to maintain O2 saturations.  CT scan of the chest noted concerns for bronchiectasis with concern for  pneumonia. -Admit to telemetry bed -Continuous pulse oximetry with nasal cannula oxygen oxygen to maintain O2 saturations greater than 92% -Check sputum cultures -Incentive spirometry and flutter valve every 4 hours -Changed antibiotics to Rocephin and doxycycline IV -Prednisone 40 mg daily -Mucinex  Leukocytosis: Acute.  WBC elevated 11.4, but did not meet SIRS.  Suspect secondary to above. -Recheck CBC tomorrow morning  Chest discomfort  elevated troponin: Acute.  Patient did note some mild chest discomfort prior to coming to the hospital, but states symptoms had since resolved.  High-sensitivity troponin 22->56.  EKG without significant ischemic changes. -Continue to trend high-sensitivity troponins -If continued to trend up will transition to heparin drip and formally consult cardiology  Hypertensive urgency: Blood pressures elevated up to 210/116.  Home blood pressure regimen includes losartan 25 mg daily, furosemide 40 mg twice daily, metoprolol 37.5 mg twice daily, and sotalol 80 mg twice daily.   -Continue losartan, sotalol, and metoprolol  Diastolic congestive heart failure: Possibly acute on chronic.  On physical exam patient without significant lower extremity swelling.  BNP was elevated to 303.76, but unchanged from previous.  Last EF was noted to be 60 to 65% with normal diastolic parameters 01/1156.  Patient has been given Lasix 80 mg IV x1 dose.Since that time patient had put out at least 1700 mL of urine. -Strict intake and output and daily weights -Check echocardiogram to make sure no change in heart function -Give additional 40 mg of Lasix IV x1 dose tonight, and resume home p.o. regimen in a.m.  Paroxysmal atrial fibrillation on chronic anticoagulation: Patient appears to be in sinus rhythm at this time.CHA2DS2-VASc score = 4.  Followed in outpatient  setting by Dr. Rayann Heman -Continue Eliquis  Hypothyroidism: No recent thyroid studies available. -Add-on TSH -Continue  levothyroxine  Hyperlipidemia -Continue Crestor  Abdominal discomfort  Cholelithiasis: Patient does complain of some midline abdominal discomfort.  Was noted to have incidental finding noted on CT without signs of cholecystitis. -Check LFT -Check abdominal ultrasound for further evaluation -May warrant further work-up  Morbid obesity: BMI previously 51.07 kg/m -Recheck weight  DVT prophylaxis: Eliquis Code Status: Full Family Communication: Husband updated at bedside Disposition Plan: To be determined Consults called: None Admission status: Inpatient, require more than 2 midnight stay  Norval Morton MD [facility]   If 7PM-7AM, please contact night-coverage   06/05/2021, 10:32 AM

## 2021-06-05 NOTE — ED Triage Notes (Signed)
Arrives with [facility] to ED. SOB several weeks with cough. Received neb treatment enroute. 83% on RA.HX afib with eliquis.

## 2021-06-05 NOTE — ED Provider Notes (Signed)
[facility] EMERGENCY DEPARTMENT Provider Note   CSN: [id number] Arrival date & time: 06/05/21  0221     History Chief Complaint  Patient presents with   Shortness of Breath    [name] is a 75 y.o. female.  HPI     This is a 75 year old female with history of coronary artery disease, chronic diastolic heart failure, dermatomyositis, hypertension who presents with shortness of breath.  Patient reports she has had shortness of breath over the last 4 weeks.  She was seen and evaluated by her primary physician 1 week ago.  She reports negative COVID testing.  She was placed on an antibiotic which she is still taking.  She states she has had shortness of breath and cough.  Shortness of breath is worse with laying flat.  At baseline she has significant lower extremity edema but has noted some worsening.  Patient also states that she developed some chest discomfort this evening.  She describes it as pressure nonradiating.  Currently she is not having any chest discomfort.  She is not had any fevers.  No known sick contacts.  Past Medical History:  Diagnosis Date   Anemia    Bilateral leg edema    C. difficile enteritis    CAD (coronary artery disease)    a. LHC 03/2006: EF 65%, mid LAD 40-50%, ostial D1 70-80%, normal circumflex, normal RCA;  b. Lexiscan Myoview 09/2010: No ischemia or scar, EF 75%;  c. Lex MV 5/14:  Normal, no ischemia, EF 71% // Myoview 08/2018: EF 43, normal perfusion; Intermediate Risk due to low EF (Echo 07/2018 with normal EF)    Cataract    bilateral-had surgery   Cellulitis of right leg    Hx - resolved   Chronic diastolic CHF (congestive heart failure) ([place])    Echo 8/14:  Mild LVH, EF 55-65%, normal wall motion, Tr AI, mild MR, mod TR, PASP 45 // 12/2014 Echo: EF 60-65%, nl wall motion, mildly dil RA/LA, PASP 69mHg.// Echo 07/2018: EF 60-65, mild LVH, mod diastolic dysfunction, normal RVSF, severe LAE, mod MAC, mild MR, mild TR    Continuous  urine leakage    Dermatomyositis (HCC)    Deviated septum    Essential hypertension    Family history of adverse reaction to anesthesia    .' MY SON IS A DIFFICULT INTUBATION"   Gall stones    a. 08/2014 Abd U/S:  Large gallstone measuring 4.2 cm.    GERD (gastroesophageal reflux disease)    Gout    Hiatal hernia    History of pancreatitis    History of PFTs    a. PFTs 10/2012: normal.    Hyperlipidemia    Hypothyroidism    Internal hemorrhoids    Morbid obesity ([place]    Multifocal atrial tachycardia ([place] 08/10/2018   Noted during admission in 07/2018   Osteoarthritis    spine hips knees   PAF (paroxysmal atrial fibrillation) ([place]    a. CHA2DS2VASc = 5-->poor OAC candidate 2/2 falls. No problems now per patient 03/2016   Pneumonia    Seizures ([facility]    as a baby   Shingles    Hx   Sleep apnea    Does not use CPAP   Spinal cord compression ([place] 02/21/2014    Patient Active Problem List   Diagnosis Date Noted   Secondary hypercoagulable state ([place] 06/24/2019   Multifocal atrial tachycardia ([place] 08/10/2018   Chest pain    Cellulitis, bilateral LE  but R>L, abd wall  08/13/2015   Sepsis due to cellulitis ([place]) 08/13/2015   Acute kidney injury ([place]) 08/13/2015   Hypokalemia 08/13/2015   History of iron deficiency anemia 08/13/2015   Thrombocytopenia ([place]) 08/13/2015   Sepsis ([place]) 08/13/2015   Chronic diastolic CHF (congestive heart failure) (HCC)    Paroxysmal atrial fibrillation ([place])    Essential hypertension    Dermatomyositis ([place]) 02/18/2014   Morbid obesity ([place]) 12/09/2013   CAD (coronary artery disease) 09/25/2008   LEG EDEMA, BILATERAL 09/20/2008    Past Surgical History:  Procedure Laterality Date   CATARACT EXTRACTION, BILATERAL Bilateral    COLONOSCOPY  03/2011   Implantable loop recorder placement  12/21/2018   Med[employer]onic Reveal [place] model LNQ11 (SN [id number]) implanted by Dr Rayann Heman in office for AF management   NASAL SEPTUM SURGERY     TOTAL  ABDOMINAL HYSTERECTOMY     VERTEBROPLASTY  Sept 7 and May 30, 2014   x 2, T11/T12   WISDOM TOOTH EXTRACTION       OB History   No obstetric history on file.     Family History  Problem Relation Age of Onset   Colon cancer Mother        mets, spot on lungs and spine   Coronary artery disease Father    Emphysema Father    Heart attack Brother        x 2   Colon cancer Paternal Uncle    Kidney disease Brother    Hypertension Brother    Stroke Neg Hx    Esophageal cancer Neg Hx     Social History   Tobacco Use   Smoking status: Never   Smokeless tobacco: Never  Vaping Use   Vaping Use: Never used  Substance Use Topics   Alcohol use: No   Drug use: No    Home Medications Prior to Admission medications   Medication Sig Start Date End Date Taking? Authorizing Provider  acetaminophen (TYLENOL) 500 MG tablet Take 500-1,000 mg by mouth every 6 (six) hours as needed for mild pain or fever.    Yes [provider]  alendronate (FOSAMAX) 70 MG tablet Take 1 tablet by mouth every Tuesday.  04/01/15  Yes [provider]  allopurinol (ZYLOPRIM) 100 MG tablet Take 100 mg by mouth 2 (two) times daily.  01/14/16  Yes [provider]  aluminum hydroxide-magnesium carbonate (GAVISCON) 95-358 MG/15ML SUSP Take 15 mLs by mouth as needed for indigestion or heartburn.   Yes [provider]  azithromycin (ZITHROMAX) 250 MG tablet Take 250 mg by mouth See admin instructions. 500 mg day 1 250 mg day 2-5 06/02/21  Yes [provider]  CALCIUM PO Take 500 mg by mouth daily.   Yes [provider]  Cholecalciferol (VITAMIN D3) 2000 UNITS TABS Take 2,000 Units by mouth daily.   Yes [provider]  dextromethorphan (DELSYM) 30 MG/5ML liquid Take 60 mg by mouth as needed for cough.   Yes [provider]  ELIQUIS 5 MG TABS tablet TAKE 1 TABLET BY MOUTH TWICE A DAY Patient taking differently: Take 5 mg by mouth 2 (two) times daily.  02/16/21  Yes Allred, Jeneen Rinks, MD  famotidine (PEPCID) 20 MG tablet Take 20 mg by mouth at bedtime.   Yes [provider]  fluconazole (DIFLUCAN) 150 MG tablet Take 150 mg by mouth See admin instructions. 1 tablet at the beginning of antibiotic course  1 tablet at the end of antibiotic course 06/02/21  Yes [provider]  folic acid (FOLVITE) 1 MG tablet Take 1 mg by mouth 4 (four) times daily.   Yes [provider]  furosemide (LASIX) 40 MG tablet Take 1 tablet (40 mg total) by mouth 2 (two) times daily. 08/18/15  Yes [name], [name], MD  levothyroxine (SYNTHROID) 88 MCG tablet Take 88 mcg by mouth daily.  07/07/17  Yes [provider]  loperamide (IMODIUM A-D) 2 MG tablet Take 2 mg by mouth 4 (four) times daily as needed for diarrhea or loose stools.   Yes [provider]  losartan (COZAAR) 25 MG tablet TAKE 1 TABLET BY MOUTH  DAILY Patient taking differently: Take 25 mg by mouth daily. 03/18/21  Yes Allred, Jeneen Rinks, MD  methotrexate (RHEUMATREX) 2.5 MG tablet Take 12.5 mg by mouth every Wednesday.  09/17/14  Yes [provider]  metoprolol succinate (TOPROL-XL) 25 MG 24 hr tablet TAKE 1 AND 1/2 TABLETS BY  MOUTH TWICE DAILY Patient taking differently: Take 37.5 mg by mouth in the morning and at bedtime. 11/11/20  Yes Allred, Jeneen Rinks, MD  Multiple Vitamin (MULTIVITAMIN) tablet Take 1 tablet by mouth daily.   Yes [provider]  OVER THE COUNTER MEDICATION Place 1 drop into both eyes daily as needed (for dry eyes. Advanced eye relief).   Yes [provider]  phenylephrine-shark liver oil-mineral oil-petrolatum (PREPARATION H) 0.25-3-14-71.9 % rectal ointment Place 1 application rectally as needed for hemorrhoids.   Yes [provider]  potassium chloride SA (KLOR-CON M20) 20 MEQ tablet Take 1 tablet (20 mEq total) by mouth 3 (three) times daily. 08/18/15  Yes [name], [name], MD  predniSONE (DELTASONE) 5 MG tablet Take 5 mg by  mouth daily with breakfast.   Yes [provider]  rosuvastatin (CRESTOR) 5 MG tablet Take 5 mg by mouth every other day.    Yes [provider]  saccharomyces boulardii (FLORASTOR) 250 MG capsule Take 250 mg by mouth 2 (two) times daily.   Yes [provider]  sotalol (BETAPACE) 80 MG tablet TAKE 1 TABLET BY MOUTH  TWICE DAILY Patient taking differently: Take 80 mg by mouth 2 (two) times daily. 11/11/20  Yes Allred, Jeneen Rinks, MD  vitamin B-12 (CYANOCOBALAMIN) 1000 MCG tablet Take 1,000 mcg by mouth daily.   Yes [provider]  albuterol (VENTOLIN HFA) 108 (90 Base) MCG/ACT inhaler albuterol sulfate HFA 90 mcg/actuation aerosol inhaler  INHALE 1-2 PUFFS BY MOUTH EVERY 6 HOURS AS NEEDED FOR WHEEZE OR SHORTNESS OF BREATH Patient not taking: Reported on 06/05/2021    [provider]  dicyclomine (BENTYL) 10 MG capsule Take 1 capsule (10 mg total) by mouth 4 (four) times daily -  before meals and at bedtime. Patient not taking: Reported on 06/05/2021 10/27/20   [name], MD    Allergies    Butoconazole, Keflex [cephalexin], Lipitor [atorvastatin calcium], Cortisone, and Hydrocodone  Review of Systems   Review of Systems  Constitutional:  Negative for fever.  Respiratory:  Positive for cough, chest tightness and shortness of breath.   Cardiovascular:  Positive for leg swelling. Negative for chest pain.  Gastrointestinal:  Negative for abdominal pain, diarrhea, nausea and vomiting.  Genitourinary:  Negative for dysuria.  All other systems reviewed and are negative.  Physical Exam Updated Vital Signs BP (!) 188/103    Pulse 81    Temp 98.5 F (36.9 C) (Oral)    Resp (!) 25    SpO2 99%   Physical Exam Vitals and  nursing note reviewed.  Constitutional:      General: She is not in acute distress.    Appearance: She is well-developed. She is obese. She is ill-appearing.  HENT:     Head: Normocephalic and atraumatic.  Eyes:     Pupils: Pupils  are equal, round, and reactive to light.  Cardiovascular:     Rate and Rhythm: Normal rate and regular rhythm.     Heart sounds: Normal heart sounds.  Pulmonary:     Effort: Pulmonary effort is normal. No respiratory distress.     Breath sounds: No wheezing.     Comments: Actively coughing, coarse breath sounds bilaterally, no active wheezing Abdominal:     General: Bowel sounds are normal.     Palpations: Abdomen is soft.  Musculoskeletal:     Cervical back: Neck supple.     Right lower leg: Edema present.     Left lower leg: Edema present.  Skin:    General: Skin is warm and dry.  Neurological:     Mental Status: She is alert and oriented to person, place, and time.  Psychiatric:        Mood and Affect: Mood normal.    ED Results / Procedures / Treatments   Labs (all labs ordered are listed, but only abnormal results are displayed) Labs Reviewed  CBC WITH DIFFERENTIAL/PLATELET - Abnormal; Notable for the following components:      Result Value   WBC 11.4 (*)    Hemoglobin 15.1 (*)    HCT 46.8 (*)    MCV 104.9 (*)    Neutro Abs 8.3 (*)    All other components within normal limits  BASIC METABOLIC PANEL - Abnormal; Notable for the following components:   Glucose, Bld 101 (*)    Creatinine, Ser 1.01 (*)    GFR, Estimated 58 (*)    All other components within normal limits  BRAIN NATRIURETIC PEPTIDE - Abnormal; Notable for the following components:   B Natriuretic Peptide 303.7 (*)    All other components within normal limits  TROPONIN I (HIGH SENSITIVITY) - Abnormal; Notable for the following components:   Troponin I (High Sensitivity) 22 (*)    All other components within normal limits  RESP PANEL BY RT-PCR (FLU A&B, COVID) [id number]  TROPONIN I (HIGH SENSITIVITY)    EKG EKG Interpretation  Date/Time:  Saturday June 05 2021 02:25:24 EST Ventricular Rate:  77 PR Interval:  175 QRS Duration: 94 QT Interval:  403 QTC Calculation: 457 R Axis:   59 Text  Interpretation: Sinus rhythm Low voltage, precordial leads Anteroseptal infarct, old Borderline repolarization abnormality Minimal ST elevation, inferior leads Artifact in lead(s) I II III aVR aVL aVF Confirmed by Thayer Jew [phone]) on 06/05/2021 2:51:22 AM  Radiology DG Chest Portable 1 View  Result Date: 06/05/2021 CLINICAL DATA:  Shortness of breath. EXAM: PORTABLE CHEST 1 VIEW COMPARISON:  08/28/2020. FINDINGS: The heart is enlarged and the mediastinal contours are within normal limits. There is chronic elevation of the left diaphragm and low lung volumes with mild atelectasis at the lung bases. No consolidation, effusion, or pneumothorax. A loop recorder device is present over the left chest. No acute osseous abnormality. IMPRESSION: Cardiomegaly with no acute process Electronically Signed   By: Brett Fairy M.D.   On: 06/05/2021 02:45    Procedures .Critical Care Performed by: Merryl Hacker, MD Authorized by: Merryl Hacker, MD   Critical care provider statement:    Critical care time (minutes):  30   Critical care was necessary to treat or prevent imminent or life-threatening deterioration of the following conditions:  Respiratory failure and cardiac failure   Critical care was time spent personally by me on the following activities:  Development of treatment plan with patient or surrogate, discussions with consultants, evaluation of patient's response to treatment, examination of patient, ordering and review of laboratory studies, ordering and review of radiographic studies, ordering and performing treatments and interventions, pulse oximetry, re-evaluation of patient's condition and review of old charts   Medications Ordered in ED Medications  furosemide (LASIX) injection 80 mg (80 mg Intravenous Given 06/05/21 0532)    ED Course  I have reviewed the triage vital signs and the nursing notes.  Pertinent labs & imaging results that were available during my care of the  patient were reviewed by me and considered in my medical decision making (see chart for details).    MDM Rules/Calculators/A&P                          Patient presents with shortness of breath.  Reports recent history of shortness of breath and cough.  Currently on antibiotics.  Reports negative work-up by PCP.  Worsening shortness of breath with some mild chest discomfort overnight.  O2 sats 83%.  Notably hypertensive.  She is nontoxic.  She is in no respiratory distress but is on oxygen.  She looks slightly volume overloaded.  Considerations include but not limited to, acute CHF, pneumonia, less likely primary ACS.  EKG shows no evidence of acute ischemia or arrhythmia.  Troponin slightly elevated at 22 this is in the setting of slightly elevated creatinine.  BNP is also elevated.  Chest x-ray shows cardiomegaly.  I also suspect some mild interstitial edema.  Patient was given a dose of Lasix.  We will try to wean oxygen and repeat troponin for trending.  Will likely need admission.      Final Clinical Impression(s) / ED Diagnoses Final diagnoses:  Shortness of breath  Acute on chronic congestive heart failure, unspecified heart failure type [facility])    Rx / DC Orders ED Discharge Orders     None        Merryl Hacker, MD 06/05/21 [phone]

## 2021-06-05 NOTE — ED Provider Notes (Signed)
Pt signed out by Dr. Dina Rich pending 2nd trop.  2nd trop has increased.  I went to talk to pt and noted that she had a horrible cough.  I gave her a neb and added on a CT of her chest.  Chest CT:  IMPRESSION:  1. Widespread progressed Bronchiectasis since 2017. Bilateral lower  lobe peribronchial nodularity and patchy opacity is compatible with  active infection superimposed on chronic lung disease. Small volume  of retained or aspirated secretions in the right mainstem bronchus.  Consider both conventional and atypical (MAI) infectious etiology.  No pleural effusion.  2. Advanced calcified coronary artery atherosclerosis. Mild  cardiomegaly. Prosthetic mitral valve. Aortic Atherosclerosis  (ICD10-I70.0).  3. Cholelithiasis.  4. Previously augmented T11 and T12 compression fractures.    Due to the results of the scan, I added on rocephin and zithromax IV.  I spoke with Dr. Tamala Julian ([facility]) regarding admission.  CRITICAL CARE Performed by: Isla Pence   Total critical care time: 30 minutes  Critical care time was exclusive of separately billable procedures and treating other patients.  Critical care was necessary to treat or prevent imminent or life-threatening deterioration.  Critical care was time spent personally by me on the following activities: development of treatment plan with patient and/or surrogate as well as nursing, discussions with consultants, evaluation of patient's response to treatment, examination of patient, obtaining history from patient or surrogate, ordering and performing treatments and interventions, ordering and review of laboratory studies, ordering and review of radiographic studies, pulse oximetry and re-evaluation of patient's condition.      Isla Pence, MD 06/05/21 [phone]

## 2021-06-06 ENCOUNTER — Inpatient Hospital Stay (HOSPITAL_COMMUNITY): Payer: Medicare Other

## 2021-06-06 DIAGNOSIS — J189 Pneumonia, unspecified organism: Secondary | ICD-10-CM | POA: Diagnosis not present

## 2021-06-06 DIAGNOSIS — E039 Hypothyroidism, unspecified: Secondary | ICD-10-CM | POA: Diagnosis not present

## 2021-06-06 DIAGNOSIS — I5032 Chronic diastolic (congestive) heart failure: Secondary | ICD-10-CM

## 2021-06-06 DIAGNOSIS — J9601 Acute respiratory failure with hypoxia: Secondary | ICD-10-CM | POA: Diagnosis not present

## 2021-06-06 DIAGNOSIS — I2510 Atherosclerotic heart disease of native coronary artery without angina pectoris: Secondary | ICD-10-CM | POA: Diagnosis not present

## 2021-06-06 LAB — ECHOCARDIOGRAM COMPLETE
Area-P 1/2: 2.87 cm2
Height: 56 in
S' Lateral: 2.5 cm
Weight: 3400 oz

## 2021-06-06 LAB — BASIC METABOLIC PANEL
Anion gap: 13 (ref 5–15)
BUN: 21 mg/dL (ref 8–23)
CO2: 29 mmol/L (ref 22–32)
Calcium: 8.8 mg/dL — ABNORMAL LOW (ref 8.9–10.3)
Chloride: 95 mmol/L — ABNORMAL LOW (ref 98–111)
Creatinine, Ser: 1.13 mg/dL — ABNORMAL HIGH (ref 0.44–1.00)
GFR, Estimated: 51 mL/min — ABNORMAL LOW (ref >60)
Glucose, Bld: 126 mg/dL — ABNORMAL HIGH (ref 70–99)
Potassium: 3.8 mmol/L (ref 3.5–5.1)
Sodium: 137 mmol/L (ref 135–145)

## 2021-06-06 LAB — HEPATIC FUNCTION PANEL
ALT: 23 U/L (ref 0–44)
AST: 46 U/L — ABNORMAL HIGH (ref 15–41)
Albumin: 3.2 g/dL — ABNORMAL LOW (ref 3.5–5.0)
Alkaline Phosphatase: 66 U/L (ref 38–126)
Bilirubin, Direct: 0.4 mg/dL — ABNORMAL HIGH (ref 0.0–0.2)
Indirect Bilirubin: 0.6 mg/dL (ref 0.3–0.9)
Total Bilirubin: 1 mg/dL (ref 0.3–1.2)
Total Protein: 7.1 g/dL (ref 6.5–8.1)

## 2021-06-06 LAB — URINE CULTURE: Culture: 30000 — AB

## 2021-06-06 LAB — CBC
HCT: 45.2 % (ref 36.0–46.0)
Hemoglobin: 14.8 g/dL (ref 12.0–15.0)
MCH: 33.3 pg (ref 26.0–34.0)
MCHC: 32.7 g/dL (ref 30.0–36.0)
MCV: 101.6 fL — ABNORMAL HIGH (ref 80.0–100.0)
Platelets: 138 10*3/uL — ABNORMAL LOW (ref 150–400)
RBC: 4.45 MIL/uL (ref 3.87–5.11)
RDW: 13 % (ref 11.5–15.5)
WBC: 9.4 10*3/uL (ref 4.0–10.5)
nRBC: 0 % (ref 0.0–0.2)

## 2021-06-06 LAB — PROCALCITONIN: Procalcitonin: <0.1 ng/mL

## 2021-06-06 MED ORDER — METRONIDAZOLE 500 MG/100ML IV SOLN: 500.0000 mg | Freq: Once | INTRAVENOUS | Status: DC

## 2021-06-06 MED ORDER — METRONIDAZOLE 500 MG/100ML IV SOLN
500.0000 mg | Freq: Three times a day (TID) | INTRAVENOUS | Status: DC
Start: 2021-06-06 — End: 2021-06-09
  Administered 2021-06-06 – 2021-06-09 (×9): 500 mg via INTRAVENOUS
  Filled 2021-06-06 (×9): qty 100

## 2021-06-06 MED ORDER — HEPARIN (PORCINE) 25000 UT/250ML-% IV SOLN
950.0000 [IU]/h | INTRAVENOUS | Status: DC
Start: 2021-06-06 — End: 2021-06-08
  Administered 2021-06-06: 22:00:00 850 [IU]/h via INTRAVENOUS
  Administered 2021-06-07: 950 [IU]/h via INTRAVENOUS
  Filled 2021-06-06 (×2): qty 250

## 2021-06-06 NOTE — Progress Notes (Signed)
PROGRESS NOTE    ZALIKA TIESZEN  NID:[id number] DOB: [birth year] DOA: 06/05/2021 PCP: [name], DO   Brief Narrative:  HPI: [name] is a 75 y.o. female with medical history significant of HTN, hyperlipidemia, CAD, diastolic CHF, A. fib on chronic anticoagulation, hypothyroidism, and morbid obesity who presents with complaints of cough and shortness of breath.  Symptoms started approximately 3 weeks ago with complaints of congestion.  Her cough has been nonproductive.  Associated symptoms included some lower abdominal discomfort intermittent episodes of diarrhea, and mild lower extremity swelling which she feels she may have some excess fluid on her., Denies having any fever, nausea, vomiting, or chest pain.  Her husband has been coughing as well, and both have tested negative for COVID-19 on 2 separate occasions.  She had notified her PCP of symptoms and had been sent in a prescription for Delsym which helped some with cough.  She was also started on azithromycin and Diflucan 2 days ago.  Last night however patient began coughing and was unable to catch her breath with chest discomfort for which EMS ended up being called.  Prior to that patient also reported that her blood pressures were elevated into the 230s.  This time patient denies having any chest pain.  She admits that she may have also missed some of her diuretic doses during this time when she has been not feeling well.   In route with EMS O2 saturations as low as 83% on room air.   ED Course: Upon admission to the emergency department patient was seen to be afebrile, respirations 19-32, blood pressure elevated to 210/116, and O2 saturations initially maintained on 8 L of nasal cannula oxygen.  Labs significant for WBC 11.4, hemoglobin 15.1 BUN 22, creatinine 1.01, glucose 101, BNP 303.7, and high-sensitivity troponin 22-> 56.  CT scan of the chest noted widespread progression of bronchiectasis since 2017 with bilateral lower lobe nodular  and patchy opacity compatible with active infection superimposed on chronic lung disease with retained or aspirated secretions of the right mainstem bronchus.  Patient has been given Lasix 80 mg IV, DuoNeb breathing treatment, and Levaquin.  Assessment & Plan:   Principal Problem:   CAP (community acquired pneumonia) Active Problems:   CAD (coronary artery disease)   Morbid obesity with BMI of 50.0-59.9, adult (HCC)   Chronic diastolic CHF (congestive heart failure) (HCC)   PAF (paroxysmal atrial fibrillation) (HCC)   Acute respiratory failure with hypoxia (HCC)   Elevated troponin   Hypothyroidism   Leukocytosis  Acute respiratory failure with hypoxia secondary pneumonia  bronchiectasis: Leukocytosis 11.4 with some tachypnea but does not meet SIRS criteria and thus does not meet sepsis.  Feels better than yesterday.  This morning when I saw her, she was on room air and saturating 92%.  She did have rhonchi bilaterally.  We will continue Rocephin and doxycycline.  Place incentive spirometry.  Continue prednisone for now.   Chest pain: Troponin slightly elevated but flat.  Echo shows normal ejection fraction with no wall motion abnormality but diastolic dysfunction.  Likely demand ischemia.  Hypertensive urgency: Blood pressures elevated up to 210/116 while in the ED.  Home blood pressure regimen includes losartan 25 mg daily, furosemide 40 mg twice daily, metoprolol 37.5 mg twice daily, and sotalol 80 mg twice daily.   -Continue losartan, sotalol, and metoprolol.  Blood pressure now much better controlled.   Chronic diastolic congestive heart failure:  On physical exam patient without significant lower extremity swelling.  No pulmonary edema or vascular congestion on the chest x-ray.  BNP is slightly elevated but we do not know her baseline.  Appears euvolemic on exam.  Resume home dose of oral Lasix.  Echo as mentioned above.  Cholelithiasis with possible acute cholecystitis: Abdominal  ultrasound suspicious for acute cholecystitis.  Her total bilirubin is elevated and CBD is dilated.  She has positive Murphy sign and right upper quadrant tenderness on my exam as well.  Discussed with Dr. Vernetta Honey of general surgery.  Will order repeat LFTs.  She will see patient in consultation later.  She is already on Rocephin and doxycycline.  We will give her 1 dose of Flagyl to cover anaerobes.  I have also ordered HIDA scan but likely will not be done soon due to holidays.   Paroxysmal atrial fibrillation on chronic anticoagulation: Patient appears to be in sinus rhythm at this time.  Continue Lopressor but I am going to hold Eliquis in case she needs some sort of surgery.   Hypothyroidism: Continue Synthroid.   Hyperlipidemia -Continue Crestor   Morbid obesity: BMI previously 51.07 kg/m: Weight loss counseled.  DVT prophylaxis:    Code Status: Full Code  Family Communication:  her husband present at bedside.  Plan of care discussed with patient in length and he verbalized understanding and agreed with it.  Status is: Inpatient  Remains inpatient appropriate because: Needs inpatient medical management for medical issues as mentioned above.  Estimated body mass index is 47.64 kg/m as calculated from the following:   Height as of this encounter: 4\' 8"  (1.422 m).   Weight as of this encounter: 96.4 kg.     Nutritional Assessment: Body mass index is 47.64 kg/m.Marland Kitchen Seen by dietician.  I agree with the assessment and plan as outlined below: Nutrition Status:  Skin Assessment: I have examined the patient's skin and I agree with the wound assessment as performed by the wound care RN as outlined below:    Consultants:  General surgery  Procedures:  None  Antimicrobials:  Anti-infectives (From admission, onward)    Start     Dose/Rate Route Frequency Ordered Stop   06/06/21 1000  cefTRIAXone (ROCEPHIN) 2 g in sodium chloride 0.9 % 100 mL IVPB  Status:  Discontinued        2  g 200 mL/hr over 30 Minutes Intravenous Every 24 hours 06/05/21 1345 06/05/21 1349   06/06/21 1000  cefTRIAXone (ROCEPHIN) 2 g in sodium chloride 0.9 % 100 mL IVPB        2 g 200 mL/hr over 30 Minutes Intravenous Every 24 hours 06/05/21 1349 06/10/21 0959   06/06/21 0000  doxycycline (VIBRAMYCIN) 100 mg in sodium chloride 0.9 % 250 mL IVPB        100 mg 125 mL/hr over 120 Minutes Intravenous Every 12 hours 06/05/21 1350 06/09/21 2359   06/05/21 1015  levofloxacin (LEVAQUIN) IVPB 750 mg        750 mg 100 mL/hr over 90 Minutes Intravenous  Once 06/05/21 1002 06/05/21 1222          Subjective: Patient seen and examined.  She states that she feels better than yesterday, her breathing and chest pain is improved.  She still has abdominal pain but she states that her abdominal pain is chronic and she is aware that she has gallstones.  Objective: Vitals:   06/06/21 0655 06/06/21 0724 06/06/21 0900 06/06/21 1236  BP:  (!) 142/77  104/79  Pulse:  (!) 58 64 66  Resp:  20 (!) 23 (!) 21  Temp:  98.4 F (36.9 C)    TempSrc:  Oral    SpO2:  97% 98% 98%  Weight: 96.4 kg     Height:        Intake/Output Summary (Last 24 hours) at 06/06/2021 1421 Last data filed at 06/06/2021 1239 Gross per 24 hour  Intake 240 ml  Output 800 ml  Net -560 ml   Filed Weights   06/05/21 2024 06/06/21 0655  Weight: 96.8 kg 96.4 kg    Examination:  General exam: Appears calm and comfortable, obese Respiratory system: Rhonchi bilaterally. Respiratory effort normal. Cardiovascular system: S1 & S2 heard, RRR. No JVD, murmurs, rubs, gallops or clicks. No pedal edema. Gastrointestinal system: Abdomen is nondistended, soft but is tender in the epigastrium and right upper quadrant area with positive Murphy sign.  No organomegaly or masses felt. Normal bowel sounds heard. Central nervous system: Alert and oriented. No focal neurological deficits. Extremities: Symmetric 5 x 5 power. Skin: No rashes, lesions or  ulcers Psychiatry: Judgement and insight appear normal. Mood & affect appropriate.    Data Reviewed: I have personally reviewed following labs and imaging studies  CBC: Recent Labs  Lab 06/05/21 0233 06/06/21 0427  WBC 11.4* 9.4  NEUTROABS 8.3*  --   HGB 15.1* 14.8  HCT 46.8* 45.2  MCV 104.9* 101.6*  PLT 171 016*   Basic Metabolic Panel: Recent Labs  Lab 06/05/21 0233 06/06/21 0427  NA 138 137  K 5.1 3.8  CL 101 95*  CO2 28 29  GLUCOSE 101* 126*  BUN 22 21  CREATININE 1.01* 1.13*  CALCIUM 9.4 8.8*   GFR: Estimated Creatinine Clearance: 41.6 mL/min (A) (by C-G formula based on SCr of 1.13 mg/dL (H)). Liver Function Tests: Recent Labs  Lab 06/05/21 1703  AST 40  ALT 26  ALKPHOS 68  BILITOT 1.3*  PROT 6.4*  ALBUMIN 3.1*   No results for input(s): LIPASE, AMYLASE in the last 168 hours. No results for input(s): AMMONIA in the last 168 hours. Coagulation Profile: No results for input(s): INR, PROTIME in the last 168 hours. Cardiac Enzymes: No results for input(s): CKTOTAL, CKMB, CKMBINDEX, TROPONINI in the last 168 hours. BNP (last 3 results) Recent Labs    03/15/21 1227  PROBNP 822*   HbA1C: No results for input(s): HGBA1C in the last 72 hours. CBG: No results for input(s): GLUCAP in the last 168 hours. Lipid Profile: No results for input(s): CHOL, HDL, LDLCALC, TRIG, CHOLHDL, LDLDIRECT in the last 72 hours. Thyroid Function Tests: Recent Labs    06/05/21 2058  TSH 0.424   Anemia Panel: No results for input(s): VITAMINB12, FOLATE, FERRITIN, TIBC, IRON, RETICCTPCT in the last 72 hours. Sepsis Labs: Recent Labs  Lab 06/05/21 0703 06/06/21 0427  PROCALCITON <0.10 <0.10    Recent Results (from the past 240 hour(s))  Resp Panel by RT-PCR (Flu A&B, Covid) Nasopharyngeal Swab     Status: None   Collection Time: 06/05/21  2:39 AM   Specimen: Nasopharyngeal Swab; Nasopharyngeal(NP) swabs in vial transport medium  Result Value Ref Range Status    SARS Coronavirus 2 by RT PCR NEGATIVE NEGATIVE Final    Comment: (NOTE) SARS-CoV-2 target nucleic acids are NOT DETECTED.  The SARS-CoV-2 RNA is generally detectable in upper respiratory specimens during the acute phase of infection. The lowest concentration of SARS-CoV-2 viral copies this assay can detect is 138 copies/mL. A negative result does not preclude SARS-Cov-2 infection and should not  be used as the sole basis for treatment or other patient management decisions. A negative result may occur with  improper specimen collection/handling, submission of specimen other than nasopharyngeal swab, presence of viral mutation(s) within the areas targeted by this assay, and inadequate number of viral copies(<138 copies/mL). A negative result must be combined with clinical observations, patient history, and epidemiological information. The expected result is Negative.  Fact Sheet for Patients:  EntrepreneurPulse.com.au  Fact Sheet for Healthcare Providers:  IncredibleEmployment.be  This test is no t yet approved or cleared by the [place] FDA and  has been authorized for detection and/or diagnosis of SARS-CoV-2 by FDA under an Emergency Use Authorization (EUA). This EUA will remain  in effect (meaning this test can be used) for the duration of the COVID-19 declaration under Section 564(b)(1) of the Act, 21 U.S.C.section 360bbb-3(b)(1), unless the authorization is terminated  or revoked sooner.       Influenza A by PCR NEGATIVE NEGATIVE Final   Influenza B by PCR NEGATIVE NEGATIVE Final    Comment: (NOTE) The Xpert Xpress SARS-CoV-2/FLU/RSV plus assay is intended as an aid in the diagnosis of influenza from Nasopharyngeal swab specimens and should not be used as a sole basis for treatment. Nasal washings and aspirates are unacceptable for Xpert Xpress SARS-CoV-2/FLU/RSV testing.  Fact Sheet for  Patients: EntrepreneurPulse.com.au  Fact Sheet for Healthcare Providers: IncredibleEmployment.be  This test is not yet approved or cleared by the [place] FDA and has been authorized for detection and/or diagnosis of SARS-CoV-2 by FDA under an Emergency Use Authorization (EUA). This EUA will remain in effect (meaning this test can be used) for the duration of the COVID-19 declaration under Section 564(b)(1) of the Act, 21 U.S.C. section 360bbb-3(b)(1), unless the authorization is terminated or revoked.  Performed at [facility], [place] [address], [place], [place] [address]   Urine Culture     Status: Abnormal   Collection Time: 06/05/21  9:12 AM   Specimen: Urine, Clean Catch  Result Value Ref Range Status   Specimen Description URINE, CLEAN CATCH  Final   Special Requests NONE  Final   Culture (A)  Final    30,000 COLONIES/mL DIPHTHEROIDS(CORYNEBACTERIUM SPECIES) Standardized susceptibility testing for this organism is not available. Performed at [facility], [place] [address], [place], [place] [address]    Report Status 06/06/2021 FINAL  Final      Radiology Studies: CT Chest Wo Contrast  Result Date: 06/05/2021 CLINICAL DATA:  75 year old female with shortness of breath. EXAM: CT CHEST WITHOUT CONTRAST TECHNIQUE: Multidetector CT imaging of the chest was performed following the standard protocol without IV contrast. COMPARISON:  Portable chest 0231 hours today.  Chest CT 08/13/2015. FINDINGS: Cardiovascular: Advanced calcified coronary artery atherosclerosis and/or stents. Prosthetic mitral valve. Mild cardiomegaly. No pericardial effusion. Mild-to-moderate Calcified aortic atherosclerosis. Mediastinum/Nodes: Superior left chest loop recorder or lead Liss ICD. No mediastinal lymphadenopathy. Lungs/Pleura: Layering retained secretions in the right mainstem bronchus and bronchus intermedius. Other major airways are patent.  Bilateral bronchiectasis has progressed since 2017. Left greater than right lower lobe peribronchial nodularity and patchy opacity is superimposed on chronic lower lobe scarring. Upper lung mild tree-in-bud nodular opacity appears more chronic and stable. No consolidation. No pleural effusion. Upper Abdomen: Large lamellated gallstone in the visible gallbladder is 37 mm diameter. No pericholecystic inflammation is evident. Otherwise negative visible noncontrast liver, spleen, pancreas, adrenal glands, kidneys and bowel in the upper abdomen. Musculoskeletal: Chronically augmented T11 and T12 compression fractures appears  stable since 2017. Underlying osteopenia. No acute osseous abnormality identified. IMPRESSION: 1. Widespread progressed Bronchiectasis since 2017. Bilateral lower lobe peribronchial nodularity and patchy opacity is compatible with active infection superimposed on chronic lung disease. Small volume of retained or aspirated secretions in the right mainstem bronchus. Consider both conventional and atypical (MAI) infectious etiology. No pleural effusion. 2. Advanced calcified coronary artery atherosclerosis. Mild cardiomegaly. Prosthetic mitral valve. Aortic Atherosclerosis (ICD10-I70.0). 3. Cholelithiasis. 4. Previously augmented T11 and T12 compression fractures. Electronically Signed   By: Genevie Ann M.D.   On: 06/05/2021 09:58   US Abdomen Complete  Result Date: 06/05/2021 CLINICAL DATA:  Abdomen pain EXAM: ABDOMEN ULTRASOUND COMPLETE COMPARISON:  Ultrasound 06/14/2016, CT 05/25/2016 FINDINGS: Gallbladder: Shadowing stone. Slight increased wall thickness at 3.9 mm. Positive sonographic Murphy. Common bile duct: Diameter: Dilated up to 11 mm. Liver: No focal lesion identified. Within normal limits in parenchymal echogenicity. Portal vein is patent on color Doppler imaging with normal direction of blood flow towards the liver. IVC: No abnormality visualized. Pancreas: Visualized portion unremarkable.  Spleen: Size and appearance within normal limits. Right Kidney: Length: 8.8 cm. Echogenicity within normal limits. No hydronephrosis. Cortical thinning is present Left Kidney: Length: 9.1 cm. Echogenicity within normal limits. No mass or hydronephrosis. Cortical thinning is present. Abdominal aorta: No aneurysm visualized. Other findings: None. IMPRESSION: 1. Cholelithiasis with slight increased wall thickness and positive sonographic Murphy suspicious for an acute cholecystitis. Dilated common bile duct up to 11 mm, correlate with LFTs with follow-up MRCP as indicated 2. Bilateral renal cortical thinning consistent with atrophy. No hydronephrosis Electronically Signed   By: Donavan Foil M.D.   On: 06/05/2021 19:18   DG Chest Portable 1 View  Result Date: 06/05/2021 CLINICAL DATA:  Shortness of breath. EXAM: PORTABLE CHEST 1 VIEW COMPARISON:  08/28/2020. FINDINGS: The heart is enlarged and the mediastinal contours are within normal limits. There is chronic elevation of the left diaphragm and low lung volumes with mild atelectasis at the lung bases. No consolidation, effusion, or pneumothorax. A loop recorder device is present over the left chest. No acute osseous abnormality. IMPRESSION: Cardiomegaly with no acute process Electronically Signed   By: Brett Fairy M.D.   On: 06/05/2021 02:45   ECHOCARDIOGRAM COMPLETE  Result Date: 06/06/2021    ECHOCARDIOGRAM REPORT   Patient Name:   LEIDA LUTON Date of Exam: 06/06/2021 Medical Rec #:  [id number]    Height:       56.0 in Accession #:    [id number]   Weight:       212.5 lb Date of Birth:  12/31/1945     BSA:          1.822 m Patient Age:    77 years     BP:           142/77 mmHg Patient Gender: F            HR:           67 bpm. Exam Location:  Inpatient Procedure: 2D Echo Indications:    congestive heart failure  History:        Patient has prior history of Echocardiogram examinations, most                 recent 11/02/2020. CHF, CAD, Arrythmias:paroxysmal  a-fib; Risk                 Factors:Hypertension.  Sonographer:    Johny Chess RDCS Referring Phys: [phone] [place]  Comments: Image acquisition challenging due to respiratory motion and coughing. IMPRESSIONS  1. Left ventricular ejection fraction, by estimation, is 70 to 75%. The left ventricle has hyperdynamic function. The left ventricle has no regional wall motion abnormalities. There is mild concentric left ventricular hypertrophy. Left ventricular diastolic parameters are consistent with Grade II diastolic dysfunction (pseudonormalization). Elevated left ventricular end-diastolic pressure.  2. Right ventricular systolic function is normal. The right ventricular size is normal. There is moderately elevated pulmonary artery systolic pressure. The estimated right ventricular systolic pressure is 40.9 mmHg.  3. Left atrial size was mild to moderately dilated.  4. The mitral valve is abnormal. Mild to moderate mitral valve regurgitation. Moderate mitral annular calcification.  5. The aortic valve is tricuspid. Aortic valve regurgitation is not visualized.  6. The inferior vena cava is normal in size with greater than 50% respiratory variability, suggesting right atrial pressure of 3 mmHg. Comparison(s): Prior images reviewed side by side. LVEF vigorous with moderate diastolic dysfunction. Mild to moderate mitral regurgitation and moderately elevated RVSP. FINDINGS  Left Ventricle: Left ventricular ejection fraction, by estimation, is 70 to 75%. The left ventricle has hyperdynamic function. The left ventricle has no regional wall motion abnormalities. The left ventricular internal cavity size was small. There is mild concentric left ventricular hypertrophy. Left ventricular diastolic parameters are consistent with Grade II diastolic dysfunction (pseudonormalization). Elevated left ventricular end-diastolic pressure. Right Ventricle: The right ventricular size is normal. No increase in right  ventricular wall thickness. Right ventricular systolic function is normal. There is moderately elevated pulmonary artery systolic pressure. The tricuspid regurgitant velocity is 3.31 m/s, and with an assumed right atrial pressure of 3 mmHg, the estimated right ventricular systolic pressure is 81.1 mmHg. Left Atrium: Left atrial size was mild to moderately dilated. Right Atrium: Right atrial size was normal in size. Pericardium: There is no evidence of pericardial effusion. Presence of epicardial fat layer. Mitral Valve: The mitral valve is abnormal. There is mild thickening of the mitral valve leaflet(s). There is mild calcification of the mitral valve leaflet(s). Moderate mitral annular calcification. Mild to moderate mitral valve regurgitation. Tricuspid Valve: The tricuspid valve is grossly normal. Tricuspid valve regurgitation is mild. Aortic Valve: The aortic valve is tricuspid. There is mild aortic valve annular calcification. Aortic valve regurgitation is not visualized. Pulmonic Valve: The pulmonic valve was grossly normal. Pulmonic valve regurgitation is trivial. Aorta: The aortic root is normal in size and structure. Venous: The inferior vena cava is normal in size with greater than 50% respiratory variability, suggesting right atrial pressure of 3 mmHg. IAS/Shunts: No atrial level shunt detected by color flow Doppler.  LEFT VENTRICLE PLAX 2D LVIDd:         4.20 cm   Diastology LVIDs:         2.50 cm   LV e' medial:    6.42 cm/s LV PW:         1.00 cm   LV E/e' medial:  17.8 LV IVS:        1.10 cm   LV e' lateral:   6.64 cm/s LVOT diam:     1.80 cm   LV E/e' lateral: 17.2 LV SV:         55 LV SV Index:   30 LVOT Area:     2.54 cm  RIGHT VENTRICLE             IVC RV S prime:     15.60 cm/s  IVC diam: 1.80 cm TAPSE (M-mode):  2.0 cm LEFT ATRIUM             Index        RIGHT ATRIUM           Index LA diam:        4.30 cm 2.36 cm/m   RA Area:     12.90 cm LA Vol (A2C):   58.6 ml 32.17 ml/m  RA Volume:    29.00 ml  15.92 ml/m LA Vol (A4C):   72.5 ml 39.80 ml/m LA Biplane Vol: 68.9 ml 37.83 ml/m  AORTIC VALVE LVOT Vmax:   104.00 cm/s LVOT Vmean:  68.300 cm/s LVOT VTI:    0.217 m  AORTA Ao Root diam: 2.80 cm Ao Asc diam:  2.80 cm MITRAL VALVE                TRICUSPID VALVE MV Area (PHT): 2.87 cm     TR Peak grad:   43.8 mmHg MV Decel Time: 264 msec     TR Vmax:        331.00 cm/s MV E velocity: 114.00 cm/s MV A velocity: 66.30 cm/s   SHUNTS MV E/A ratio:  1.72         Systemic VTI:  0.22 m                             Systemic Diam: 1.80 cm Rozann Lesches MD Electronically signed by Rozann Lesches MD Signature Date/Time: 06/06/2021/1:07:57 PM    Final     Scheduled Meds:  allopurinol  100 mg Oral BID   apixaban  5 mg Oral BID   famotidine  20 mg Oral QHS   feeding supplement  237 mL Oral BID BM   furosemide  40 mg Oral BID   guaiFENesin  600 mg Oral BID   levothyroxine  88 mcg Oral Daily   losartan  25 mg Oral Daily   metoprolol succinate  37.5 mg Oral BID   predniSONE  40 mg Oral Q breakfast   rosuvastatin  5 mg Oral QODAY   saccharomyces boulardii  250 mg Oral BID   sodium chloride flush  3 mL Intravenous Q12H   sotalol  80 mg Oral BID   Continuous Infusions:  cefTRIAXone (ROCEPHIN)  IV 2 g (06/06/21 0943)   doxycycline (VIBRAMYCIN) IV 100 mg (06/06/21 0033)     LOS: 1 day   Time spent: 36 minutes   Darliss Cheney, MD [facility]  06/06/2021, 2:21 PM  Please page via Amion and do not message via secure chat for anything urgent. Secure chat can be used for anything non urgent.  How to contact the [facility] Attending or Consulting provider [place] or covering provider during after hours [place], for this patient?  Check the care team in [facility] and look for a) attending/consulting [facility] provider listed and b) the [facility] team listed. Page or secure chat [phone]. Log into [email or web address] and use [place]'s universal password to access. If you do not have the password, please contact the  hospital operator. Locate the [facility] provider you are looking for under [facility] and page to a number that you can be directly reached. If you still have difficulty reaching the provider, please page the [facility] (Director on Call) for the Hospitalists listed on amion for assistance.

## 2021-06-06 NOTE — Progress Notes (Signed)
Patient is a difficult stick. I have requested IV team for a second PIV in order to start heparin gtt which is not compatible with antibiotic doxycycline

## 2021-06-06 NOTE — Progress Notes (Signed)
°  Echocardiogram 2D Echocardiogram has been performed.  Johny Chess 06/06/2021, 9:18 AM

## 2021-06-06 NOTE — Progress Notes (Signed)
ANTICOAGULATION CONSULT NOTE - Initial Consult  Pharmacy Consult for heparin Indication: atrial fibrillation  Allergies  Allergen Reactions   Butoconazole Itching, Rash and Other (See Comments)    'Redness and swelling feverish ' 'worse symptoms '   Keflex [Cephalexin] Diarrhea   Lipitor [Atorvastatin Calcium] Other (See Comments)    'Muscle weakness'     Cortisone     This medication causes confusion   Hydrocodone Other (See Comments)    Patient Measurements: Height: _0  (142.2 cm) Weight: 96.4 kg (212 lb 8 oz) (scale C) IBW/kg (Calculated) : 36.3 Heparin Dosing Weight: 60.8  Vital Signs: Temp: 98.4 F (36.9 C) (12/25 0724) Temp Source: Oral (12/25 0724) BP: 104/79 (12/25 1236) Pulse Rate: 66 (12/25 1236)  Labs: Recent Labs    06/05/21 0233 06/05/21 0658 06/05/21 1503 06/05/21 1703 06/06/21 0427  HGB 15.1*  --   --   --  14.8  HCT 46.8*  --   --   --  45.2  PLT 171  --   --   --  138*  CREATININE 1.01*  --   --   --  1.13*  TROPONINIHS 22* 56* 56* 56*  --     Estimated Creatinine Clearance: 41.6 mL/min (A) (by C-G formula based on SCr of 1.13 mg/dL (H)).   Medical History: Past Medical History:  Diagnosis Date   Anemia    Bilateral leg edema    C. difficile enteritis    CAD (coronary artery disease)    a. LHC 03/2006: EF 65%, mid LAD 40-50%, ostial D1 70-80%, normal circumflex, normal RCA;  b. Lexiscan Myoview 09/2010: No ischemia or scar, EF 75%;  c. Lex MV 5/14:  Normal, no ischemia, EF 71% // Myoview 08/2018: EF 43, normal perfusion; Intermediate Risk due to low EF (Echo 07/2018 with normal EF)    Cataract    bilateral-had surgery   Cellulitis of right leg    Hx - resolved   Chronic diastolic CHF (congestive heart failure) ([place])    Echo 8/14:  Mild LVH, EF 55-65%, normal wall motion, Tr AI, mild MR, mod TR, PASP 45 // 12/2014 Echo: EF 60-65%, nl wall motion, mildly dil RA/LA, PASP 42mHg.// Echo 07/2018: EF 60-65, mild LVH, mod diastolic dysfunction,  normal RVSF, severe LAE, mod MAC, mild MR, mild TR    Continuous urine leakage    Dermatomyositis (HCC)    Deviated septum    Essential hypertension    Family history of adverse reaction to anesthesia    .' MY SON IS A DIFFICULT INTUBATION"   Gall stones    a. 08/2014 Abd U/S:  Large gallstone measuring 4.2 cm.    GERD (gastroesophageal reflux disease)    Gout    Hiatal hernia    History of pancreatitis    History of PFTs    a. PFTs 10/2012: normal.    Hyperlipidemia    Hypothyroidism    Internal hemorrhoids    Morbid obesity ([place]    Multifocal [place]   Noted during admission in 07/2018   Osteoarthritis    spine hips knees   [place]oxysmal [place]) [place]    a. CHA2DS2VASc = 5-->poor OAC candidate 2/2 falls. No problems now per patient 03/2016   Pneumonia    Seizures ([place]    as a baby   Shingles    Hx   Sleep apnea    Does not use CPAP   Spinal cord compression ([place] 02/21/2014  Medications:  Scheduled:   allopurinol  100 mg Oral BID   famotidine  20 mg Oral QHS   feeding supplement  237 mL Oral BID BM   furosemide  40 mg Oral BID   guaiFENesin  600 mg Oral BID   levothyroxine  88 mcg Oral Daily   losartan  25 mg Oral Daily   metoprolol succinate  37.5 mg Oral BID   predniSONE  40 mg Oral Q breakfast   rosuvastatin  5 mg Oral QODAY   saccharomyces boulardii  250 mg Oral BID   sodium chloride flush  3 mL Intravenous Q12H   sotalol  80 mg Oral BID   Infusions:   cefTRIAXone (ROCEPHIN)  IV 2 g (06/06/21 0943)   doxycycline (VIBRAMYCIN) IV 100 mg (06/06/21 0033)   metronidazole      Assessment: Patient has atrial fibrillation on Eliquis PTA, currently held for cholecystectomy planned on Tuesday. Pharmacy consulted to dose heparin until surgery. Last dose of Eliquis was at 0922 this AM, so heparin will be scheduled to start around 2200. No bleed reported, H/H stable Plts 138k.  Goal of Therapy:  aPTT 66-102  seconds Monitor platelets by anticoagulation protocol: Yes   Plan:  Start heparin at 850 units/hr at 2200 Monitor heparin level and with AM labs  Thank you for allowing pharmacy to participate in this patient's care.  Reatha Harps, PharmD PGY1 Pharmacy Resident 06/06/2021 3:29 PM Check [email or web address] for unit specific pharmacy number

## 2021-06-06 NOTE — Consult Note (Signed)
Reason for Consult/Chief Complaint: acute cholecystitis Consultant: Doristine Bosworth, MD  [name] is an 75 y.o. female.   HPI: 40F with known h/o gallstones, also with morbid obesity, CAD, CHF, AF on DOAC, last dose of Eliquis this AM p/w pulmonary sx 12/24. She is being treated for CAP with CTX and doxy. Since admission, she has had abdominal pain and diarrhea and RUQ u/s was performed with concerns for acute cholecystitis, but also with CBD measuring 48m. Tbili at the time of presentation was 1.3. HIDA was ordered by the primary team, but due to availability will likely not be able to be performed today.   Past Medical History:  Diagnosis Date   Anemia    Bilateral leg edema    C. difficile enteritis    CAD (coronary artery disease)    a. LHC 03/2006: EF 65%, mid LAD 40-50%, ostial D1 70-80%, normal circumflex, normal RCA;  b. Lexiscan Myoview 09/2010: No ischemia or scar, EF 75%;  c. Lex MV 5/14:  Normal, no ischemia, EF 71% // Myoview 08/2018: EF 43, normal perfusion; Intermediate Risk due to low EF (Echo 07/2018 with normal EF)    Cataract    bilateral-had surgery   Cellulitis of right leg    Hx - resolved   Chronic diastolic CHF (congestive heart failure) ([facility]    Echo 8/14:  Mild LVH, EF 55-65%, normal wall motion, Tr AI, mild MR, mod TR, PASP 45 // 12/2014 Echo: EF 60-65%, nl wall motion, mildly dil RA/LA, PASP 413mg.// Echo 07/2018: EF 60-65, mild LVH, mod diastolic dysfunction, normal RVSF, severe LAE, mod MAC, mild MR, mild TR    Continuous urine leakage    Dermatomyositis (HCC)    Deviated septum    Essential hypertension    Family history of adverse reaction to anesthesia    .' MY SON IS A DIFFICULT INTUBATION"   Gall stones    a. 08/2014 Abd U/S:  Large gallstone measuring 4.2 cm.    GERD (gastroesophageal reflux disease)    Gout    Hiatal hernia    History of pancreatitis    History of PFTs    a. PFTs 10/2012: normal.    Hyperlipidemia    Hypothyroidism    Internal  hemorrhoids    Morbid obesity ([facility]   Multifocal atrial tachycardia ([facility]2/28/2020   Noted during admission in 07/2018   Osteoarthritis    spine hips knees   PAF (paroxysmal atrial fibrillation) ([facility]   a. CHA2DS2VASc = 5-->poor OAC candidate 2/2 falls. No problems now per patient 03/2016   Pneumonia    Seizures ([facility]   as a baby   Shingles    Hx   Sleep apnea    Does not use CPAP   Spinal cord compression ([facility]9/04/2014    Past Surgical History:  Procedure Laterality Date   CATARACT EXTRACTION, BILATERAL Bilateral    COLONOSCOPY  03/2011   Implantable loop recorder placement  12/21/2018   Medtronic Reveal LiPortolaodel [id number] (SN [id number]) implanted by Dr AlRayann Hemann office for AF management   NASAL SEPTUM SURGERY     TOTAL ABDOMINAL HYSTERECTOMY     VERTEBROPLASTY  Sept 7 and May 30, 2014   x 2, T11/T12   WISDOM TOOTH EXTRACTION      Family History  Problem Relation Age of Onset   Colon cancer Mother        mets, spot on lungs and spine   Coronary artery disease Father  Emphysema Father    Heart attack Brother        x 2   Colon cancer Paternal Uncle    Kidney disease Brother    Hypertension Brother    Stroke Neg Hx    Esophageal cancer Neg Hx     Social History:  reports that she has never smoked. She has never used smokeless tobacco. She reports that she does not drink alcohol and does not use drugs.  Allergies:  Allergies  Allergen Reactions   Butoconazole Itching, Rash and Other (See Comments)    'Redness and swelling feverish ' 'worse symptoms '   Keflex [Cephalexin] Diarrhea   Lipitor [Atorvastatin Calcium] Other (See Comments)    'Muscle weakness'     Cortisone     This medication causes confusion   Hydrocodone Other (See Comments)    Medications: I have reviewed the patient's current medications.  Results for orders placed or performed during the hospital encounter of 06/05/21 (from the past 48 hour(s))  CBC with Differential     Status:  Abnormal   Collection Time: 06/05/21  2:33 AM  Result Value Ref Range   WBC 11.4 (H) 4.0 - 10.5 K/uL   RBC 4.46 3.87 - 5.11 MIL/uL   Hemoglobin 15.1 (H) 12.0 - 15.0 g/dL   HCT 46.8 (H) 36.0 - 46.0 %   MCV 104.9 (H) 80.0 - 100.0 fL   MCH 33.9 26.0 - 34.0 pg   MCHC 32.3 30.0 - 36.0 g/dL   RDW 13.2 11.5 - 15.5 %   Platelets 171 150 - 400 K/uL   nRBC 0.0 0.0 - 0.2 %   Neutrophils Relative % 74 %   Neutro Abs 8.3 (H) 1.7 - 7.7 K/uL   Lymphocytes Relative 16 %   Lymphs Abs 1.8 0.7 - 4.0 K/uL   Monocytes Relative 8 %   Monocytes Absolute 0.9 0.1 - 1.0 K/uL   Eosinophils Relative 2 %   Eosinophils Absolute 0.3 0.0 - 0.5 K/uL   Basophils Relative 0 %   Basophils Absolute 0.0 0.0 - 0.1 K/uL   Immature Granulocytes 0 %   Abs Immature Granulocytes 0.04 0.00 - 0.07 K/uL    Comment: Performed at [facility], [address] [place], [place] [address]  Basic metabolic panel     Status: Abnormal   Collection Time: 06/05/21  2:33 AM  Result Value Ref Range   Sodium 138 135 - 145 mmol/L   Potassium 5.1 3.5 - 5.1 mmol/L   Chloride 101 98 - 111 mmol/L   CO2 28 22 - 32 mmol/L   Glucose, Bld 101 (H) 70 - 99 mg/dL    Comment: Glucose reference range applies only to samples taken after fasting for at least 8 hours.   BUN 22 8 - 23 mg/dL   Creatinine, Ser 1.01 (H) 0.44 - 1.00 mg/dL   Calcium 9.4 8.9 - 10.3 mg/dL   GFR, Estimated 58 (L) >60 mL/min    Comment: (NOTE) Calculated using the CKD-EPI Creatinine Equation (2021)    Anion gap 9 5 - 15    Comment: Performed at [facility] [address] [place], [place] [address]  Brain natriuretic peptide     Status: Abnormal   Collection Time: 06/05/21  2:33 AM  Result Value Ref Range   B Natriuretic Peptide 303.7 (H) 0.0 - 100.0 pg/mL    Comment: Performed at [facility] [address] [place], [place] [address]  Troponin I (  High Sensitivity)     Status: Abnormal   Collection Time: 06/05/21  2:33 AM  Result Value Ref Range    Troponin I (High Sensitivity) 22 (H) <18 ng/L    Comment: (NOTE) Elevated high sensitivity troponin I (hsTnI) values and significant  changes across serial measurements may suggest ACS but many other  chronic and acute conditions are known to elevate hsTnI results.  Refer to the "Links" section for chest pain algorithms and additional  guidance. Performed at [facility], [place] [address], [place], [place] [address]   Resp Panel by RT-PCR (Flu A&B, Covid) Nasopharyngeal Swab     Status: None   Collection Time: 06/05/21  2:39 AM   Specimen: Nasopharyngeal Swab; Nasopharyngeal(NP) swabs in vial transport medium  Result Value Ref Range   SARS Coronavirus 2 by RT PCR NEGATIVE NEGATIVE    Comment: (NOTE) SARS-CoV-2 target nucleic acids are NOT DETECTED.  The SARS-CoV-2 RNA is generally detectable in upper respiratory specimens during the acute phase of infection. The lowest concentration of SARS-CoV-2 viral copies this assay can detect is 138 copies/mL. A negative result does not preclude SARS-Cov-2 infection and should not be used as the sole basis for treatment or other patient management decisions. A negative result may occur with  improper specimen collection/handling, submission of specimen other than nasopharyngeal swab, presence of viral mutation(s) within the areas targeted by this assay, and inadequate number of viral copies(<138 copies/mL). A negative result must be combined with clinical observations, patient history, and epidemiological information. The expected result is Negative.  Fact Sheet for Patients:  EntrepreneurPulse.com.au  Fact Sheet for Healthcare Providers:  IncredibleEmployment.be  This test is no t yet approved or cleared by the [place] FDA and  has been authorized for detection and/or diagnosis of SARS-CoV-2 by FDA under an Emergency Use Authorization (EUA). This EUA will remain  in effect (meaning this  test can be used) for the duration of the COVID-19 declaration under Section 564(b)(1) of the Act, 21 U.S.C.section 360bbb-3(b)(1), unless the authorization is terminated  or revoked sooner.       Influenza A by PCR NEGATIVE NEGATIVE   Influenza B by PCR NEGATIVE NEGATIVE    Comment: (NOTE) The Xpert Xpress SARS-CoV-2/FLU/RSV plus assay is intended as an aid in the diagnosis of influenza from Nasopharyngeal swab specimens and should not be used as a sole basis for treatment. Nasal washings and aspirates are unacceptable for Xpert Xpress SARS-CoV-2/FLU/RSV testing.  Fact Sheet for Patients: EntrepreneurPulse.com.au  Fact Sheet for Healthcare Providers: IncredibleEmployment.be  This test is not yet approved or cleared by the [place] FDA and has been authorized for detection and/or diagnosis of SARS-CoV-2 by FDA under an Emergency Use Authorization (EUA). This EUA will remain in effect (meaning this test can be used) for the duration of the COVID-19 declaration under Section 564(b)(1) of the Act, 21 U.S.C. section 360bbb-3(b)(1), unless the authorization is terminated or revoked.  Performed at [facility], [place] [address], [place], [place] [address]   Troponin I (High Sensitivity)     Status: Abnormal   Collection Time: 06/05/21  6:58 AM  Result Value Ref Range   Troponin I (High Sensitivity) 56 (H) <18 ng/L    Comment: RESULT CALLED TO, READ BACK BY AND VERIFIED WITH: S BERTRAND RN BY SSTEPHENS [phone] [id number] (NOTE) Elevated high sensitivity troponin I (hsTnI) values and significant  changes across serial measurements may suggest ACS but many other  chronic and acute conditions are known  to elevate hsTnI results.  Refer to the Links section for chest pain algorithms and additional  guidance. Performed at [facility], [place] [address], [place], [place] [address]   Procalcitonin - Baseline     Status: None   Collection Time:  06/05/21  7:03 AM  Result Value Ref Range   Procalcitonin <0.10 ng/mL    Comment:        Interpretation: PCT (Procalcitonin) <= 0.5 ng/mL: Systemic infection (sepsis) is not likely. Local bacterial infection is possible. (NOTE)       Sepsis PCT Algorithm           Lower Respiratory Tract                                      Infection PCT Algorithm    ----------------------------     ----------------------------         PCT < 0.25 ng/mL                PCT < 0.10 ng/mL          Strongly encourage             Strongly discourage   discontinuation of antibiotics    initiation of antibiotics    ----------------------------     -----------------------------       PCT 0.25 - 0.50 ng/mL            PCT 0.10 - 0.25 ng/mL               OR       >80% decrease in PCT            Discourage initiation of                                            antibiotics      Encourage discontinuation           of antibiotics    ----------------------------     -----------------------------         PCT >= 0.50 ng/mL              PCT 0.26 - 0.50 ng/mL               AND        <80% decrease in PCT             Encourage initiation of                                             antibiotics       Encourage continuation           of antibiotics    ----------------------------     -----------------------------        PCT >= 0.50 ng/mL                  PCT > 0.50 ng/mL               AND         increase in PCT                  Strongly encourage  initiation of antibiotics    Strongly encourage escalation           of antibiotics                                     -----------------------------                                           PCT <= 0.25 ng/mL                                                 OR                                        > 80% decrease in PCT                                      Discontinue / Do not initiate                                              antibiotics  Performed at [facility], [address], [place], [place] [address]   Urinalysis, Routine w reflex microscopic     Status: Abnormal   Collection Time: 06/05/21  9:12 AM  Result Value Ref Range   Color, Urine YELLOW YELLOW   APPearance CLEAR CLEAR   Specific Gravity, Urine 1.010 1.005 - 1.030   pH 7.0 5.0 - 8.0   Glucose, UA NEGATIVE NEGATIVE mg/dL   Hgb urine dipstick TRACE (A) NEGATIVE   Bilirubin Urine NEGATIVE NEGATIVE   Ketones, ur NEGATIVE NEGATIVE mg/dL   Protein, ur NEGATIVE NEGATIVE mg/dL   Nitrite NEGATIVE NEGATIVE   Leukocytes,Ua SMALL (A) NEGATIVE    Comment: Performed at [facility] [address], [place], [place] [address]  Urine Culture     Status: Abnormal   Collection Time: 06/05/21  9:12 AM   Specimen: Urine, Clean Catch  Result Value Ref Range   Specimen Description URINE, CLEAN CATCH    Special Requests NONE    Culture (A)     30,000 COLONIES/mL DIPHTHEROIDS(CORYNEBACTERIUM SPECIES) Standardized susceptibility testing for this organism is not available. Performed at [facility], [place] [address], [place], [place] [address]    Report Status 06/06/2021 FINAL   Urinalysis, Microscopic (reflex)     Status: None   Collection Time: 06/05/21  9:12 AM  Result Value Ref Range   RBC / HPF 0-5 0 - 5 RBC/hpf   WBC, UA 0-5 0 - 5 WBC/hpf   Bacteria, UA NONE SEEN NONE SEEN   Squamous Epithelial / LPF 0-5 0 - 5   Mucus PRESENT     Comment: Performed at [facility], [place] [address] [place], [place] [address]  Strep pneumoniae urinary antigen     Status: None   Collection Time: 06/05/21  9:12 AM  Result Value Ref Range   Strep Pneumo Urinary Antigen NEGATIVE NEGATIVE    Comment:        Infection due to S. pneumoniae cannot be absolutely ruled out since the antigen present may be below the detection limit of the test. Performed at [facility], [address], [place], [place] [address]   Troponin I (High  Sensitivity)     Status: Abnormal   Collection Time: 06/05/21  3:03 PM  Result Value Ref Range   Troponin I (High Sensitivity) 56 (H) <18 ng/L    Comment: (NOTE) Elevated high sensitivity troponin I (hsTnI) values and significant  changes across serial measurements may suggest ACS but many other  chronic and acute conditions are known to elevate hsTnI results.  Refer to the "Links" section for chest pain algorithms and additional  guidance. Performed at [facility], [place] [address] [place], [place] [address]   Troponin I (High Sensitivity)     Status: Abnormal   Collection Time: 06/05/21  5:03 PM  Result Value Ref Range   Troponin I (High Sensitivity) 56 (H) <18 ng/L    Comment: (NOTE) Elevated high sensitivity troponin I (hsTnI) values and significant  changes across serial measurements may suggest ACS but many other  chronic and acute conditions are known to elevate hsTnI results.  Refer to the "Links" section for chest pain algorithms and additional  guidance. Performed at [facility], [place] [address] [place], [place] [address]   Hepatic function panel     Status: Abnormal   Collection Time: 06/05/21  5:03 PM  Result Value Ref Range   Total Protein 6.4 (L) 6.5 - 8.1 g/dL   Albumin 3.1 (L) 3.5 - 5.0 g/dL   AST 40 15 - 41 U/L   ALT 26 0 - 44 U/L   Alkaline Phosphatase 68 38 - 126 U/L   Total Bilirubin 1.3 (H) 0.3 - 1.2 mg/dL   Bilirubin, Direct 0.2 0.0 - 0.2 mg/dL   Indirect Bilirubin 1.1 (H) 0.3 - 0.9 mg/dL    Comment: Performed at [facility] [address], [place], [place] [address]  TSH     Status: None   Collection Time: 06/05/21  8:58 PM  Result Value Ref Range   TSH 0.424 0.350 - 4.500 uIU/mL    Comment: Performed by a 3rd Generation assay with a functional sensitivity of <=0.01 uIU/mL. Performed at [facility], [place] [address] [place], [place] [address]   Procalcitonin     Status: None   Collection Time: 06/06/21  4:27 AM  Result  Value Ref Range   Procalcitonin <0.10 ng/mL    Comment:        Interpretation: PCT (Procalcitonin) <= 0.5 ng/mL: Systemic infection (sepsis) is not likely. Local bacterial infection is possible. (NOTE)       Sepsis PCT Algorithm           Lower Respiratory Tract                                      Infection PCT Algorithm    ----------------------------     ----------------------------         PCT < 0.25 ng/mL                PCT < 0.10 ng/mL          Strongly encourage  Strongly discourage   discontinuation of antibiotics    initiation of antibiotics    ----------------------------     -----------------------------       PCT 0.25 - 0.50 ng/mL            PCT 0.10 - 0.25 ng/mL               OR       >80% decrease in PCT            Discourage initiation of                                            antibiotics      Encourage discontinuation           of antibiotics    ----------------------------     -----------------------------         PCT >= 0.50 ng/mL              PCT 0.26 - 0.50 ng/mL               AND        <80% decrease in PCT             Encourage initiation of                                             antibiotics       Encourage continuation           of antibiotics    ----------------------------     -----------------------------        PCT >= 0.50 ng/mL                  PCT > 0.50 ng/mL               AND         increase in PCT                  Strongly encourage                                      initiation of antibiotics    Strongly encourage escalation           of antibiotics                                     -----------------------------                                           PCT <= 0.25 ng/mL                                                 OR                                        >  80% decrease in PCT                                      Discontinue / Do not initiate                                             antibiotics  Performed at  [facility], [place] [address] [place], [place] [address]   CBC     Status: Abnormal   Collection Time: 06/06/21  4:27 AM  Result Value Ref Range   WBC 9.4 4.0 - 10.5 K/uL   RBC 4.45 3.87 - 5.11 MIL/uL   Hemoglobin 14.8 12.0 - 15.0 g/dL   HCT 45.2 36.0 - 46.0 %   MCV 101.6 (H) 80.0 - 100.0 fL   MCH 33.3 26.0 - 34.0 pg   MCHC 32.7 30.0 - 36.0 g/dL   RDW 13.0 11.5 - 15.5 %   Platelets 138 (L) 150 - 400 K/uL   nRBC 0.0 0.0 - 0.2 %    Comment: Performed at [facility], [place] [address] [place], [place] [address]  Basic metabolic panel     Status: Abnormal   Collection Time: 06/06/21  4:27 AM  Result Value Ref Range   Sodium 137 135 - 145 mmol/L   Potassium 3.8 3.5 - 5.1 mmol/L    Comment: DELTA CHECK NOTED   Chloride 95 (L) 98 - 111 mmol/L   CO2 29 22 - 32 mmol/L   Glucose, Bld 126 (H) 70 - 99 mg/dL    Comment: Glucose reference range applies only to samples taken after fasting for at least 8 hours.   BUN 21 8 - 23 mg/dL   Creatinine, Ser 1.13 (H) 0.44 - 1.00 mg/dL   Calcium 8.8 (L) 8.9 - 10.3 mg/dL   GFR, Estimated 51 (L) >60 mL/min    Comment: (NOTE) Calculated using the CKD-EPI Creatinine Equation (2021)    Anion gap 13 5 - 15    Comment: Performed at [facility] [address] [place], [place] [address]  Hepatic function panel     Status: Abnormal   Collection Time: 06/06/21  2:48 PM  Result Value Ref Range   Total Protein 7.1 6.5 - 8.1 g/dL   Albumin 3.2 (L) 3.5 - 5.0 g/dL   AST 46 (H) 15 - 41 U/L   ALT 23 0 - 44 U/L   Alkaline Phosphatase 66 38 - 126 U/L   Total Bilirubin 1.0 0.3 - 1.2 mg/dL   Bilirubin, Direct 0.4 (H) 0.0 - 0.2 mg/dL   Indirect Bilirubin 0.6 0.3 - 0.9 mg/dL    Comment: Performed at [facility] [address], [place], [place] [address]    CT Chest Wo Contrast  Result Date: 06/05/2021 CLINICAL DATA:  75 year old female with shortness of breath. EXAM: CT CHEST WITHOUT CONTRAST TECHNIQUE: Multidetector CT imaging of the  chest was performed following the standard protocol without IV contrast. COMPARISON:  Portable chest 0231 hours today.  Chest CT 08/13/2015. FINDINGS: Cardiovascular: Advanced calcified coronary artery atherosclerosis and/or stents. Prosthetic mitral valve. Mild cardiomegaly. No pericardial effusion. Mild-to-moderate Calcified aortic atherosclerosis. Mediastinum/Nodes: Superior left chest loop recorder or lead Liss ICD. No mediastinal lymphadenopathy. Lungs/Pleura: Layering retained secretions in the right mainstem bronchus  and bronchus intermedius. Other major airways are patent. Bilateral bronchiectasis has progressed since 2017. Left greater than right lower lobe peribronchial nodularity and patchy opacity is superimposed on chronic lower lobe scarring. Upper lung mild tree-in-bud nodular opacity appears more chronic and stable. No consolidation. No pleural effusion. Upper Abdomen: Large lamellated gallstone in the visible gallbladder is 37 mm diameter. No pericholecystic inflammation is evident. Otherwise negative visible noncontrast liver, spleen, pancreas, adrenal glands, kidneys and bowel in the upper abdomen. Musculoskeletal: Chronically augmented T11 and T12 compression fractures appears stable since 2017. Underlying osteopenia. No acute osseous abnormality identified. IMPRESSION: 1. Widespread progressed Bronchiectasis since 2017. Bilateral lower lobe peribronchial nodularity and patchy opacity is compatible with active infection superimposed on chronic lung disease. Small volume of retained or aspirated secretions in the right mainstem bronchus. Consider both conventional and atypical (MAI) infectious etiology. No pleural effusion. 2. Advanced calcified coronary artery atherosclerosis. Mild cardiomegaly. Prosthetic mitral valve. Aortic Atherosclerosis (ICD10-I70.0). 3. Cholelithiasis. 4. Previously augmented T11 and T12 compression fractures. Electronically Signed   By: Genevie Ann M.D.   On: 06/05/2021 09:58    US Abdomen Complete  Result Date: 06/05/2021 CLINICAL DATA:  Abdomen pain EXAM: ABDOMEN ULTRASOUND COMPLETE COMPARISON:  Ultrasound 06/14/2016, CT 05/25/2016 FINDINGS: Gallbladder: Shadowing stone. Slight increased wall thickness at 3.9 mm. Positive sonographic Murphy. Common bile duct: Diameter: Dilated up to 11 mm. Liver: No focal lesion identified. Within normal limits in parenchymal echogenicity. Portal vein is patent on color Doppler imaging with normal direction of blood flow towards the liver. IVC: No abnormality visualized. Pancreas: Visualized portion unremarkable. Spleen: Size and appearance within normal limits. Right Kidney: Length: 8.8 cm. Echogenicity within normal limits. No hydronephrosis. Cortical thinning is present Left Kidney: Length: 9.1 cm. Echogenicity within normal limits. No mass or hydronephrosis. Cortical thinning is present. Abdominal aorta: No aneurysm visualized. Other findings: None. IMPRESSION: 1. Cholelithiasis with slight increased wall thickness and positive sonographic Murphy suspicious for an acute cholecystitis. Dilated common bile duct up to 11 mm, correlate with LFTs with follow-up MRCP as indicated 2. Bilateral renal cortical thinning consistent with atrophy. No hydronephrosis Electronically Signed   By: Donavan Foil M.D.   On: 06/05/2021 19:18   DG Chest Portable 1 View  Result Date: 06/05/2021 CLINICAL DATA:  Shortness of breath. EXAM: PORTABLE CHEST 1 VIEW COMPARISON:  08/28/2020. FINDINGS: The heart is enlarged and the mediastinal contours are within normal limits. There is chronic elevation of the left diaphragm and low lung volumes with mild atelectasis at the lung bases. No consolidation, effusion, or pneumothorax. A loop recorder device is present over the left chest. No acute osseous abnormality. IMPRESSION: Cardiomegaly with no acute process Electronically Signed   By: Brett Fairy M.D.   On: 06/05/2021 02:45   ECHOCARDIOGRAM COMPLETE  Result Date:  06/06/2021    ECHOCARDIOGRAM REPORT   Patient Name:   DARCUS EDDS Date of Exam: 06/06/2021 Medical Rec #:  [id number]    Height:       56.0 in Accession #:    [id number]   Weight:       212.5 lb Date of Birth:  [birth year]     BSA:          1.822 m Patient Age:    58 years     BP:           142/77 mmHg Patient Gender: F            HR:  67 bpm. Exam Location:  Inpatient Procedure: 2D Echo Indications:    congestive heart failure  History:        Patient has prior history of Echocardiogram examinations, most                 recent 11/02/2020. CHF, CAD, Arrythmias:paroxysmal a-fib; Risk                 Factors:Hypertension.  Sonographer:    Johny Chess RDCS Referring Phys: [id number] RONDELL A SMITH  Sonographer Comments: Image acquisition challenging due to respiratory motion and coughing. IMPRESSIONS  1. Left ventricular ejection fraction, by estimation, is 70 to 75%. The left ventricle has hyperdynamic function. The left ventricle has no regional wall motion abnormalities. There is mild concentric left ventricular hypertrophy. Left ventricular diastolic parameters are consistent with Grade II diastolic dysfunction (pseudonormalization). Elevated left ventricular end-diastolic pressure.  2. Right ventricular systolic function is normal. The right ventricular size is normal. There is moderately elevated pulmonary artery systolic pressure. The estimated right ventricular systolic pressure is 27.7 mmHg.  3. Left atrial size was mild to moderately dilated.  4. The mitral valve is abnormal. Mild to moderate mitral valve regurgitation. Moderate mitral annular calcification.  5. The aortic valve is tricuspid. Aortic valve regurgitation is not visualized.  6. The inferior vena cava is normal in size with greater than 50% respiratory variability, suggesting right atrial pressure of 3 mmHg. Comparison(s): Prior images reviewed side by side. LVEF vigorous with moderate diastolic dysfunction. Mild to moderate mitral  regurgitation and moderately elevated RVSP. FINDINGS  Left Ventricle: Left ventricular ejection fraction, by estimation, is 70 to 75%. The left ventricle has hyperdynamic function. The left ventricle has no regional wall motion abnormalities. The left ventricular internal cavity size was small. There is mild concentric left ventricular hypertrophy. Left ventricular diastolic parameters are consistent with Grade II diastolic dysfunction (pseudonormalization). Elevated left ventricular end-diastolic pressure. Right Ventricle: The right ventricular size is normal. No increase in right ventricular wall thickness. Right ventricular systolic function is normal. There is moderately elevated pulmonary artery systolic pressure. The tricuspid regurgitant velocity is 3.31 m/s, and with an assumed right atrial pressure of 3 mmHg, the estimated right ventricular systolic pressure is 82.4 mmHg. Left Atrium: Left atrial size was mild to moderately dilated. Right Atrium: Right atrial size was normal in size. Pericardium: There is no evidence of pericardial effusion. Presence of epicardial fat layer. Mitral Valve: The mitral valve is abnormal. There is mild thickening of the mitral valve leaflet(s). There is mild calcification of the mitral valve leaflet(s). Moderate mitral annular calcification. Mild to moderate mitral valve regurgitation. Tricuspid Valve: The tricuspid valve is grossly normal. Tricuspid valve regurgitation is mild. Aortic Valve: The aortic valve is tricuspid. There is mild aortic valve annular calcification. Aortic valve regurgitation is not visualized. Pulmonic Valve: The pulmonic valve was grossly normal. Pulmonic valve regurgitation is trivial. Aorta: The aortic root is normal in size and structure. Venous: The inferior vena cava is normal in size with greater than 50% respiratory variability, suggesting right atrial pressure of 3 mmHg. IAS/Shunts: No atrial level shunt detected by color flow Doppler.  LEFT  VENTRICLE PLAX 2D LVIDd:         4.20 cm   Diastology LVIDs:         2.50 cm   LV e' medial:    6.42 cm/s LV PW:         1.00 cm   LV E/e' medial:  17.8 LV  IVS:        1.10 cm   LV e' lateral:   6.64 cm/s LVOT diam:     1.80 cm   LV E/e' lateral: 17.2 LV SV:         55 LV SV Index:   30 LVOT Area:     2.54 cm  RIGHT VENTRICLE             IVC RV S prime:     15.60 cm/s  IVC diam: 1.80 cm TAPSE (M-mode): 2.0 cm LEFT ATRIUM             Index        RIGHT ATRIUM           Index LA diam:        4.30 cm 2.36 cm/m   RA Area:     12.90 cm LA Vol (A2C):   58.6 ml 32.17 ml/m  RA Volume:   29.00 ml  15.92 ml/m LA Vol (A4C):   72.5 ml 39.80 ml/m LA Biplane Vol: 68.9 ml 37.83 ml/m  AORTIC VALVE LVOT Vmax:   104.00 cm/s LVOT Vmean:  68.300 cm/s LVOT VTI:    0.217 m  AORTA Ao Root diam: 2.80 cm Ao Asc diam:  2.80 cm MITRAL VALVE                TRICUSPID VALVE MV Area (PHT): 2.87 cm     TR Peak grad:   43.8 mmHg MV Decel Time: 264 msec     TR Vmax:        331.00 cm/s MV E velocity: 114.00 cm/s MV A velocity: 66.30 cm/s   SHUNTS MV E/A ratio:  1.72         Systemic VTI:  0.22 m                             Systemic Diam: 1.80 cm Rozann Lesches MD Electronically signed by Rozann Lesches MD Signature Date/Time: 06/06/2021/1:07:57 PM    Final     ROS 10 point review of systems is negative except as listed above in HPI.   Physical Exam Blood pressure (!) 112/95, pulse 71, temperature 98.2 F (36.8 C), temperature source Oral, resp. rate 15, height _0  (1.422 m), weight 96.4 kg, SpO2 96 %. Constitutional: well-developed, well-nourished HEENT: pupils equal, round, reactive to light, 33m b/l, moist conjunctiva, external inspection of ears and nose normal, hearing diminished Oropharynx: normal oropharyngeal mucosa, poor dentition Neck: no thyromegaly, trachea midline, no midline cervical tenderness to palpation Chest: breath sounds equal bilaterally, normal respiratory effort, no midline or lateral chest wall  tenderness to palpation/deformity Abdomen: soft, RUQ TTP, no bruising, no hepatosplenomegaly GU: normal female genitalia  Back: no wounds, no thoracic/lumbar spine tenderness to palpation, no thoracic/lumbar spine stepoffs Rectal: deferred Extremities: 2+ radial and pedal pulses bilaterally, unable to assess motor and sensation bilateral UE and LE, no peripheral edema MSK: unable to assess gait/station, no clubbing/cyanosis of fingers/toes, normal ROM of all four extremities Skin: warm, dry, no rashes Psych: normal memory, normal mood/affect     Assessment/Plan: 66F with acute cholecystitis and possible choledocholithiasis. Recommend repeat Tbili. If higher, recommend MRCP and GI c/s. If stable/lower, plan for lap chole +/- IOC 12/27 due to recent Eliquis admin. Recommend CTX/flagyl. Okay for heparin gtt until surgery if indicated by primary team. D/c Eliquis. Recommend cards c/s for optimization/risk stratification.    AJesusita Oka MD General  and [place] [facility]

## 2021-06-07 ENCOUNTER — Inpatient Hospital Stay (HOSPITAL_COMMUNITY): Payer: Medicare Other | Admitting: Anesthesiology

## 2021-06-07 ENCOUNTER — Encounter (HOSPITAL_COMMUNITY): Payer: Self-pay | Admitting: Internal Medicine

## 2021-06-07 DIAGNOSIS — J9601 Acute respiratory failure with hypoxia: Secondary | ICD-10-CM | POA: Diagnosis not present

## 2021-06-07 DIAGNOSIS — Z01810 Encounter for preprocedural cardiovascular examination: Secondary | ICD-10-CM | POA: Diagnosis not present

## 2021-06-07 DIAGNOSIS — J189 Pneumonia, unspecified organism: Secondary | ICD-10-CM | POA: Diagnosis not present

## 2021-06-07 DIAGNOSIS — R778 Other specified abnormalities of plasma proteins: Secondary | ICD-10-CM | POA: Diagnosis not present

## 2021-06-07 DIAGNOSIS — I480 Paroxysmal atrial fibrillation: Secondary | ICD-10-CM | POA: Diagnosis not present

## 2021-06-07 DIAGNOSIS — I2510 Atherosclerotic heart disease of native coronary artery without angina pectoris: Secondary | ICD-10-CM | POA: Diagnosis not present

## 2021-06-07 LAB — COMPREHENSIVE METABOLIC PANEL
ALT: 23 U/L (ref 0–44)
AST: 29 U/L (ref 15–41)
Albumin: 3 g/dL — ABNORMAL LOW (ref 3.5–5.0)
Alkaline Phosphatase: 58 U/L (ref 38–126)
Anion gap: 13 (ref 5–15)
BUN: 23 mg/dL (ref 8–23)
CO2: 32 mmol/L (ref 22–32)
Calcium: 8.4 mg/dL — ABNORMAL LOW (ref 8.9–10.3)
Chloride: 98 mmol/L (ref 98–111)
Creatinine, Ser: 1.05 mg/dL — ABNORMAL HIGH (ref 0.44–1.00)
GFR, Estimated: 56 mL/min — ABNORMAL LOW (ref >60)
Glucose, Bld: 98 mg/dL (ref 70–99)
Potassium: 2.9 mmol/L — ABNORMAL LOW (ref 3.5–5.1)
Sodium: 143 mmol/L (ref 135–145)
Total Bilirubin: 0.5 mg/dL (ref 0.3–1.2)
Total Protein: 6.4 g/dL — ABNORMAL LOW (ref 6.5–8.1)

## 2021-06-07 LAB — CBC WITH DIFFERENTIAL/PLATELET
Abs Immature Granulocytes: 0.04 10*3/uL (ref 0.00–0.07)
Basophils Absolute: 0 10*3/uL (ref 0.0–0.1)
Basophils Relative: 0 %
Eosinophils Absolute: 0 10*3/uL (ref 0.0–0.5)
Eosinophils Relative: 0 %
HCT: 42.5 % (ref 36.0–46.0)
Hemoglobin: 14.2 g/dL (ref 12.0–15.0)
Immature Granulocytes: 0 %
Lymphocytes Relative: 27 %
Lymphs Abs: 2.5 10*3/uL (ref 0.7–4.0)
MCH: 33.6 pg (ref 26.0–34.0)
MCHC: 33.4 g/dL (ref 30.0–36.0)
MCV: 100.7 fL — ABNORMAL HIGH (ref 80.0–100.0)
Monocytes Absolute: 0.8 10*3/uL (ref 0.1–1.0)
Monocytes Relative: 8 %
Neutro Abs: 6.1 10*3/uL (ref 1.7–7.7)
Neutrophils Relative %: 65 %
Platelets: 157 10*3/uL (ref 150–400)
RBC: 4.22 MIL/uL (ref 3.87–5.11)
RDW: 13.1 % (ref 11.5–15.5)
WBC: 9.4 10*3/uL (ref 4.0–10.5)
nRBC: 0 % (ref 0.0–0.2)

## 2021-06-07 LAB — HEPARIN LEVEL (UNFRACTIONATED): Heparin Unfractionated: >1.1 IU/mL — ABNORMAL HIGH (ref 0.30–0.70)

## 2021-06-07 LAB — APTT
aPTT: 47 seconds — ABNORMAL HIGH (ref 24–36)
aPTT: 109 seconds — ABNORMAL HIGH (ref 24–36)

## 2021-06-07 LAB — MAGNESIUM: Magnesium: 1.5 mg/dL — ABNORMAL LOW (ref 1.7–2.4)

## 2021-06-07 LAB — PROCALCITONIN: Procalcitonin: <0.1 ng/mL

## 2021-06-07 LAB — SURGICAL PCR SCREEN
MRSA, PCR: NEGATIVE
Staphylococcus aureus: NEGATIVE

## 2021-06-07 MED ORDER — MAGNESIUM SULFATE 4 GM/100ML IV SOLN
4.0000 g | Freq: Once | INTRAVENOUS | Status: AC
  Administered 2021-06-07: 15:00:00 4 g via INTRAVENOUS
  Filled 2021-06-07: qty 100

## 2021-06-07 MED ORDER — HYDRALAZINE HCL 20 MG/ML IJ SOLN
10.0000 mg | Freq: Four times a day (QID) | INTRAMUSCULAR | Status: DC | PRN
  Filled 2021-06-07: qty 1

## 2021-06-07 MED ORDER — POTASSIUM CHLORIDE CRYS ER 20 MEQ PO TBCR
40.0000 meq | EXTENDED_RELEASE_TABLET | Freq: Once | ORAL | Status: AC
  Administered 2021-06-07: 18:00:00 40 meq via ORAL
  Filled 2021-06-07: qty 2

## 2021-06-07 MED ORDER — POTASSIUM CHLORIDE CRYS ER 20 MEQ PO TBCR: 40.0000 meq | EXTENDED_RELEASE_TABLET | Freq: Once | ORAL | Status: DC

## 2021-06-07 MED ORDER — POTASSIUM CHLORIDE CRYS ER 20 MEQ PO TBCR
40.0000 meq | EXTENDED_RELEASE_TABLET | ORAL | Status: AC
  Administered 2021-06-07: 15:00:00 40 meq via ORAL
  Filled 2021-06-07: qty 2

## 2021-06-07 MED ORDER — LOSARTAN POTASSIUM 50 MG PO TABS
50.0000 mg | ORAL_TABLET | Freq: Every day | ORAL | Status: DC
  Administered 2021-06-07 – 2021-06-11 (×5): 50 mg via ORAL
  Filled 2021-06-07 (×4): qty 1

## 2021-06-07 NOTE — TOC Progression Note (Signed)
Transition of Care [facility] - Progression Note    Patient Details  Name: [name] MRN: [id number] Date of Birth: 10/26/1945  Transition of Care [facility]) CM/SW Contact  Zenon Mayo, RN Phone Number: 06/07/2021, 4:12 PM  Clinical Narrative:     Transition of Care [facility]) Screening Note   Patient Details  Name: [name] Date of Birth: Jun 13, 1946   Transition [facility] Care [facility]) CM/SW Contact:    Zenon Mayo, RN Phone Number: 06/07/2021, 4:12 PM    Transition of Care Department [facility]) has reviewed patient and no TOC needs have been identified at this time. We will continue to monitor patient advancement through interdisciplinary progression rounds. If new patient transition needs arise, please place a TOC consult.          Expected Discharge Plan and Services                                                 Social Determinants of Health (SDOH) Interventions    Readmission Risk Interventions No flowsheet data found.

## 2021-06-07 NOTE — Progress Notes (Signed)
Patient c/o shortness of breath, expiratory wheezing noted on auscultation, O2 sat 88% on room air.  Albuterol nebs given, pt breathing better after the treatment.

## 2021-06-07 NOTE — Progress Notes (Signed)
Subjective No acute events. Husband at bedside. No complaints at present. Tolerating diet - eating eggs this morning. No n/v.  Objective: Vital signs in last 24 hours: Temp:  [98 F (36.7 C)-98.6 F (37 C)] 98.1 F (36.7 C) (12/26 0741) Pulse Rate:  [59-71] 63 (12/26 0741) Resp:  [15-23] 21 (12/26 0741) BP: (104-193)/(72-98) 193/88 (12/26 0741) SpO2:  [92 %-100 %] 98 % (12/26 0741) Weight:  [96.3 kg] 96.3 kg (12/26 0016) Last BM Date: 06/05/21  Intake/Output from previous day: 12/25 0701 - 12/26 0700 In: 1136.6 [P.O.:360; I.V.:78; IV Piggyback:698.7] Out: 2200 [Urine:2200] Intake/Output this shift: No intake/output data recorded.  Gen: NAD, comfortable CV: RRR Pulm: Normal work of breathing - on 2-3L O2 via [place]. Abd: Soft, NT/ND Ext: SCDs in place  Lab Results: CBC  Recent Labs    06/06/21 0427 06/07/21 0733  WBC 9.4 9.4  HGB 14.8 14.2  HCT 45.2 42.5  PLT 138* 157   BMET Recent Labs    06/05/21 0233 06/06/21 0427  NA 138 137  K 5.1 3.8  CL 101 95*  CO2 28 29  GLUCOSE 101* 126*  BUN 22 21  CREATININE 1.01* 1.13*  CALCIUM 9.4 8.8*   PT/INR No results for input(s): LABPROT, INR in the last 72 hours. ABG No results for input(s): PHART, HCO3 in the last 72 hours.  Invalid input(s): PCO2, PO2  Studies/Results:  Anti-infectives: Anti-infectives (From admission, onward)    Start     Dose/Rate Route Frequency Ordered Stop   06/06/21 1615  metroNIDAZOLE (FLAGYL) IVPB 500 mg        500 mg 100 mL/hr over 60 Minutes Intravenous Every 8 hours 06/06/21 1523     06/06/21 1515  metroNIDAZOLE (FLAGYL) IVPB 500 mg  Status:  Discontinued        500 mg 100 mL/hr over 60 Minutes Intravenous  Once 06/06/21 1423 06/06/21 1523   06/06/21 1000  cefTRIAXone (ROCEPHIN) 2 g in sodium chloride 0.9 % 100 mL IVPB  Status:  Discontinued        2 g 200 mL/hr over 30 Minutes Intravenous Every 24 hours 06/05/21 1345 06/05/21 1349   06/06/21 1000  cefTRIAXone (ROCEPHIN) 2 g in  sodium chloride 0.9 % 100 mL IVPB        2 g 200 mL/hr over 30 Minutes Intravenous Every 24 hours 06/05/21 1349 06/10/21 0959   06/06/21 0000  doxycycline (VIBRAMYCIN) 100 mg in sodium chloride 0.9 % 250 mL IVPB        100 mg 125 mL/hr over 120 Minutes Intravenous Every 12 hours 06/05/21 1350 06/09/21 2359   06/05/21 1015  levofloxacin (LEVAQUIN) IVPB 750 mg        750 mg 100 mL/hr over 90 Minutes Intravenous  Once 06/05/21 1002 06/05/21 1222        Assessment/Plan: Patient Active Problem List   Diagnosis Date Noted   CAP (community acquired pneumonia) 06/05/2021   Acute respiratory failure with hypoxia ([place]) 06/05/2021   Elevated troponin 06/05/2021   Hypothyroidism 06/05/2021   Leukocytosis 06/05/2021   Secondary hypercoagulable state ([place]) 06/24/2019   Multifocal atrial tachycardia ([place]) 08/10/2018   Chest pain    Cellulitis, bilateral LE but R>L, abd wall  08/13/2015   Sepsis due to cellulitis ([place]) 08/13/2015   Acute kidney injury ([place]) 08/13/2015   Hypokalemia 08/13/2015   History of iron deficiency anemia 08/13/2015   Thrombocytopenia ([place]) 08/13/2015   Sepsis ([place]) 08/13/2015   Chronic diastolic CHF (congestive heart failure) ([place])  PAF (paroxysmal atrial fibrillation) ([place])    Essential hypertension    Dermatomyositis ([place]) 02/18/2014   Morbid obesity with BMI of 50.0-59.9, adult ([place]) 12/09/2013   CAD (coronary artery disease) 09/25/2008   LEG EDEMA, BILATERAL 09/20/2008   [name] is a 57yoF with morbid obesity, CAD, CHF, AF on DOAC, last dose of Eliquis 12/25 AM presented with pulmonary sx 12/24  -Tbili remains normal -Cards consult for surgical 'clearance' prior to any surgery -I suspect this is primarily due to pulmonary process as opposed to gallbladder in origin; HIDA scan is pending -Additional decisions based on HIDA results; her cardiopulmonary status remains a concern for any potential surgery -NPO after midnight   LOS: 2 days    Nadeen Landau, MD [facility], [place]

## 2021-06-07 NOTE — Progress Notes (Signed)
ANTICOAGULATION CONSULT NOTE - Follow Up Consult  Pharmacy Consult for Heparin Indication: atrial fibrillation  Allergies  Allergen Reactions   Butoconazole Itching, Rash and Other (See Comments)    'Redness and swelling feverish ' 'worse symptoms '   Keflex [Cephalexin] Diarrhea   Lipitor [Atorvastatin Calcium] Other (See Comments)    'Muscle weakness'     Cortisone     This medication causes confusion   Hydrocodone Other (See Comments)    Patient Measurements: Height: 4\' 8"  (142.2 cm) Weight: 96.3 kg (212 lb 4.9 oz) IBW/kg (Calculated) : 36.3 Heparin Dosing Weight: 62 kg  Vital Signs: Temp: 97.9 F (36.6 C) (12/26 2008) Temp Source: Oral (12/26 2008) BP: 129/65 (12/26 2008) Pulse Rate: 46 (12/26 2008)  Labs: Recent Labs    06/05/21 0233 06/05/21 0658 06/05/21 1503 06/05/21 1703 06/06/21 0427 06/07/21 0733 06/07/21 2021  HGB 15.1*  --   --   --  14.8 14.2  --   HCT 46.8*  --   --   --  45.2 42.5  --   PLT 171  --   --   --  138* 157  --   APTT  --   --   --   --   --  47* 109*  HEPARINUNFRC  --   --   --   --   --  >1.10*  --   CREATININE 1.01*  --   --   --  1.13* 1.05*  --   TROPONINIHS 22* 56* 56* 56*  --   --   --     Estimated Creatinine Clearance: 44.7 mL/min (A) (by C-G formula based on SCr of 1.05 mg/dL (H)).  Assessment:  75 yr old female on Apixaban 5 mg BID PTA for atrial fibrillation. Apixaban stopped 12/25, with plan for lap chole on 12/27 am. Last Apixaban dose 0922 am.  Transitioned to IV heparin ~12 hours later.  Using aPTTs for heparin monitoring while heparin levels are falsely elevated due to recent Apixaban doses.  PM update: aPTT now supra-therapeutic (109 seconds) on 1050 units/hr. Will reduce.   Goal of Therapy:  Heparin level 0.3-0.7 units/ml aPTT 66-102 seconds Monitor platelets by anticoagulation protocol: Yes   Plan:  Decrease heparin drip to 950 units/hr aPTT ~8 hrs after rate change. Daily aPTT and heparin level until  correlating. Apixaban on hold for gallbladder surgery, scheduled for 0745 on 12/27. Cardiac clearance pending. Surgery has not designated a stop time - consider 4-6 hrs prior to surgery. Alerted RN to touch base with Surgery team.   Sloan Leiter, PharmD, BCPS, BCCCP Clinical Pharmacist Please refer to [facility] for [place] numbers 06/07/2021,9:23 PM

## 2021-06-07 NOTE — Consult Note (Addendum)
Cardiology Consultation:   Patient ID: [name] MRN: [id number]; DOB: [birth year]  Admit date: 06/05/2021 Date of Consult: 06/07/2021  PCP:  [name], [name] Providers Cardiologist:  [name] Grayer, MD  Electrophysiologist:  [name] Grayer, MD       Patient Profile:   [name] is a 75 y.o. female with a hx of moderate nonobstructive CAD by cath 2947, chronic diastolic CHF, paroxysmal atrial fibrillation, morbid obesity, OSA, HTN, HLD, dermatomyositis, hypothyroidism, MAT, prior anemia, chronic thrombocytopenia who is being seen 06/07/2021 for the evaluation of pre-operative evaluation at the request of Dr. Doristine [name].  History of Present Illness:   [name] follows with Dr. Rayann [name] primarily for history of atrial fib. She had an ILR implanted in 2020 for afib surveillance/management as the patient felt she was in afib all the time although was in NSR or NSR with ectopy on many of these occasions. She was previously on amiodarone but this was discontinued due to tremors and she's since been on sotalol. Last device interrogation 06/03/21 showed only 2 episodes tagged for afib that appeared to be NSR with ectopy. She had a remote cath in 2007 showing 40-50% mLAD, 70-80% ostial D1. Last ischemic eval was by nuc 08/2018 showing normal perfusion - EF was decreased to 43% felt false due to frequent PACs/PVCs as echo around same time showed normal EF.    She presented to the hospital 06/05/2021 with worsening SOB and cough for 3 weeks. She thinks this started after a pill "went down the wrong pipe" and she'd gotten choked on it. It is mostly nonproductive but sounds as though she might be able to bring something up but can't. Husband had also been coughing and they tested negative for Covid on 2 separate occasions. She had seen primary care and was prescribed an antibiotic. The night she came to the ED she felt worsening SOB associated with chest tightness and elevated BP. She was found  to be hypoxic at 83% requiring up to 8L [place] O2 and markedly hypertensive to 210/116. CT of the chest showed "Widespread progressed Bronchiectasis since 2017. Bilateral lower lobe peribronchial nodularity and patchy opacity is compatible with active infection superimposed on chronic lung disease. Small volume of retained or aspirated secretions in the right mainstem bronchus. Consider both conventional and atypical (MAI) infectious etiology." There was also advanced coronary calcification and mention of prosthetic MV but patient does not have a prostate MV. Abd US showed cholelithasis with findings suspicious for cholecystitis and dilated CBD. She received a dose of IV Lasix on admission and has since been started on antibiotics and continued oral Lasix. Her home losartan dose was increased. Labs are pertinent for hypokalemia 2.9 being managed by IM, also hypomagnesemia of 1.5, hypoalbuminemia of 3.0, macrocytosis with normal Hgb, normal TSH, and hsTroponins 22->56->56->56. General surgery plans a HIDA scan to guide further decision making but is considering cholecystectomy tomorrow therefore cardiac clearance was requested. Eliquis is on hold in lieu of IV heparin. 2D echo yesterday showed EF 70-75%, hyperdynamic function, mild LVH, grade 2 DD, elevated LVEDP, moderately elevated PASP, mild-moderate LAE, mild-moderate MR, normal sized IVC. No significant edema. Aside from the other night when BP was high and O2 was low she has not had any recent chest pain. Her activity in the preceding weeks was limited by her SOB and cough.   Past Medical History:  Diagnosis Date   Anemia    Bilateral leg edema    C. difficile enteritis  CAD (coronary artery disease)    a. LHC 03/2006: EF 65%, mid LAD 40-50%, ostial D1 70-80%, normal circumflex, normal RCA;  b. Lexiscan Myoview 09/2010: No ischemia or scar, EF 75%;  c. Lex MV 5/14:  Normal, no ischemia, EF 71% // Myoview 08/2018: EF 43, normal perfusion; Intermediate Risk  due to low EF (Echo 07/2018 with normal EF)    Cataract    bilateral-had surgery   Cellulitis of right leg    Hx - resolved   Chronic diastolic CHF (congestive heart failure) ([place])    Echo 8/14:  Mild LVH, EF 55-65%, normal wall motion, Tr AI, mild MR, mod TR, PASP 45 // 12/2014 Echo: EF 60-65%, nl wall motion, mildly dil RA/LA, PASP 51mHg.// Echo 07/2018: EF 60-65, mild LVH, mod diastolic dysfunction, normal RVSF, severe LAE, mod MAC, mild MR, mild TR    Continuous urine leakage    Dermatomyositis (HCC)    Deviated septum    Essential hypertension    Family history of adverse reaction to anesthesia    .' MY SON IS A DIFFICULT INTUBATION"   Gall stones    a. 08/2014 Abd U/S:  Large gallstone measuring 4.2 cm.    GERD (gastroesophageal reflux disease)    Gout    Hiatal hernia    History of pancreatitis    History of PFTs    a. PFTs 10/2012: normal.    Hyperlipidemia    Hypothyroidism    Internal hemorrhoids    Morbid obesity ([place]    [place] 08/10/2018   Noted during admission in 07/2018   Osteoarthritis    spine hips knees   PAF (paroxysmal atrial fibrillation) ([facility]    a. CHA2DS2VASc = 5-->poor OAC candidate 2/2 falls. No problems now per patient 03/2016   Pneumonia    Seizures ([place]    [place] a [place]   Shingles    Hx   Sleep apnea    Does not use CPAP   Spinal cord compression ([place] 02/21/2014    Past Surgical History:  Procedure Laterality Date   CATARACT EXTRACTION, BILATERAL Bilateral    COLONOSCOPY  03/2011   Implantable loop recorder placement  12/21/2018   [id number] (SN [id number]) implanted by Dr ARayann Hemanin office for AF management   NASAL SEPTUM SURGERY     TOTAL ABDOMINAL HYSTERECTOMY     VERTEBROPLASTY  Sept 7 and May 30, 2014   x 2, T11/T12   WISDOM TOOTH EXTRACTION       Home Medications:  Prior to Admission medications   Medication Sig Start Date End Date Taking? Authorizing Provider  acetaminophen  (TYLENOL) 500 MG tablet Take 500-1,000 mg by mouth every 6 (six) hours as needed for mild pain or fever.    Yes [provider]  alendronate (FOSAMAX) 70 MG tablet Take 1 tablet by mouth every Tuesday.  04/01/15  Yes [provider]  allopurinol (ZYLOPRIM) 100 MG tablet Take 100 mg by mouth 2 (two) times daily.  01/14/16  Yes [provider]  aluminum hydroxide-magnesium carbonate (GAVISCON) 95-358 MG/15ML SUSP Take 15 mLs by mouth as needed for indigestion or heartburn.   Yes [provider]  azithromycin (ZITHROMAX) 250 MG tablet Take 250 mg by mouth See admin instructions. 500 mg day 1 250 mg day 2-5 06/02/21  Yes [provider]  CALCIUM PO Take 500 mg by mouth daily.   Yes [provider]  Cholecalciferol (VITAMIN D3) 2000 UNITS TABS Take 2,000  Units by mouth daily.   Yes [provider]  dextromethorphan (DELSYM) 30 MG/5ML liquid Take 60 mg by mouth as needed for cough.   Yes [provider]  ELIQUIS 5 MG TABS tablet TAKE 1 TABLET BY MOUTH TWICE A DAY Patient taking differently: Take 5 mg by mouth 2 (two) times daily. 02/16/21  Yes Allred, Jeneen Rinks, MD  famotidine (PEPCID) 20 MG tablet Take 20 mg by mouth at bedtime.   Yes [provider]  fluconazole (DIFLUCAN) 150 MG tablet Take 150 mg by mouth See admin instructions. 1 tablet at the beginning of antibiotic course  1 tablet at the end of antibiotic course 06/02/21  Yes [provider]  folic acid (FOLVITE) 1 MG tablet Take 1 mg by mouth 4 (four) times daily.   Yes [provider]  furosemide (LASIX) 40 MG tablet Take 1 tablet (40 mg total) by mouth 2 (two) times daily. 08/18/15  Yes [name], [name], MD  levothyroxine (SYNTHROID) 88 MCG tablet Take 88 mcg by mouth daily.  07/07/17  Yes [provider]  loperamide (IMODIUM A-D) 2 MG tablet Take 2 mg by mouth 4 (four) times daily as needed for diarrhea or loose stools.   Yes [provider]  losartan (COZAAR) 25 MG tablet TAKE 1 TABLET BY MOUTH  DAILY Patient taking differently: Take 25 mg by mouth daily. 03/18/21  Yes Allred, Jeneen Rinks, MD  methotrexate (RHEUMATREX) 2.5 MG tablet Take 12.5 mg by mouth every Wednesday.  09/17/14  Yes [provider]  metoprolol succinate (TOPROL-XL) 25 MG 24 hr tablet TAKE 1 AND 1/2 TABLETS BY  MOUTH TWICE DAILY Patient taking differently: Take 37.5 mg by mouth in the [name] and at bedtime. 11/11/20  Yes Allred, Jeneen Rinks, MD  Multiple Vitamin (MULTIVITAMIN) tablet Take 1 tablet by mouth daily.   Yes [provider]  OVER THE COUNTER MEDICATION Place 1 drop into both eyes daily as needed (for dry eyes. Advanced eye relief).   Yes [provider]  phenylephrine-shark liver oil-mineral oil-petrolatum (PREPARATION H) 0.25-3-14-71.9 % rectal ointment Place 1 application rectally as needed for hemorrhoids.   Yes [provider]  potassium chloride SA (KLOR-CON M20) 20 MEQ tablet Take 1 tablet (20 mEq total) by mouth 3 (three) times daily. 08/18/15  Yes [name], [name], MD  predniSONE (DELTASONE) 5 MG tablet Take 5 mg by mouth daily with breakfast.   Yes [provider]  rosuvastatin (CRESTOR) 5 MG tablet Take 5 mg by mouth every other day.    Yes [provider]  saccharomyces boulardii (FLORASTOR) 250 MG capsule Take 250 mg by mouth 2 (two) times daily.   Yes [provider]  sotalol (BETAPACE) 80 MG tablet TAKE 1 TABLET BY MOUTH  TWICE DAILY Patient taking differently: Take 80 mg by mouth 2 (two) times daily. 11/11/20  Yes Allred, Jeneen Rinks, MD  vitamin B-12 (CYANOCOBALAMIN) 1000 MCG tablet Take 1,000 mcg by mouth daily.   Yes [provider]  albuterol (VENTOLIN HFA) 108 (90 Base) MCG/ACT inhaler albuterol sulfate HFA 90 mcg/actuation aerosol inhaler  INHALE 1-2 PUFFS BY MOUTH EVERY 6 HOURS AS NEEDED FOR WHEEZE OR SHORTNESS OF BREATH Patient not taking: Reported on 06/05/2021     [provider]  dicyclomine (BENTYL) 10 MG capsule Take 1 capsule (10 mg total) by mouth 4 (four) times daily -  before meals and at bedtime. Patient not taking: Reported on 06/05/2021 10/27/20   [name], MD    Inpatient  Medications: Scheduled Meds:  allopurinol  100 mg Oral BID   famotidine  20 mg Oral QHS   feeding supplement  237 mL Oral BID BM   furosemide  40 mg Oral BID   guaiFENesin  600 mg Oral BID   levothyroxine  88 mcg Oral Daily   losartan  50 mg Oral Daily   metoprolol succinate  37.5 mg Oral BID   potassium chloride  40 mEq Oral Q4H   predniSONE  40 mg Oral Q breakfast   rosuvastatin  5 mg Oral QODAY   saccharomyces boulardii  250 mg Oral BID   sodium chloride flush  3 mL Intravenous Q12H   sotalol  80 mg Oral BID   Continuous Infusions:  cefTRIAXone (ROCEPHIN)  IV 2 g (06/07/21 1122)   doxycycline (VIBRAMYCIN) IV 100 mg (06/07/21 0043)   heparin 1,050 Units/hr (06/07/21 1133)   magnesium sulfate bolus IVPB 4 g (06/07/21 1446)   metronidazole 500 mg (06/07/21 0846)   PRN Meds: acetaminophen **OR** acetaminophen, albuterol, alum & mag hydroxide-simeth, dextromethorphan, hydrALAZINE, ondansetron **OR** ondansetron (ZOFRAN) IV  Allergies:    Allergies  Allergen Reactions   Butoconazole Itching, Rash and Other (See Comments)    'Redness and swelling feverish ' 'worse symptoms '   Keflex [Cephalexin] Diarrhea   Lipitor [Atorvastatin Calcium] Other (See Comments)    'Muscle weakness'     Cortisone     This medication causes confusion   Hydrocodone Other (See Comments)    Social History:   Social History   Socioeconomic History   Marital status: Married    Spouse name: Not on file   Number of children: 2   Years of education: Not on file   Highest education level: Not on file  Occupational History    Employer: UNEMPLOYED  Tobacco Use   Smoking status: Never   Smokeless tobacco: Never  Vaping Use   Vaping Use: Never used  Substance  and Sexual Activity   Alcohol use: No   Drug use: No   Sexual activity: Not Currently    Birth control/protection: Post-menopausal  Other Topics Concern   Not on file  Social History Narrative   Not on file   Social Determinants of Health   Financial Resource Strain: Not on file  Food Insecurity: Not on file  Transportation Needs: Not on file  Physical Activity: Not on file  Stress: Not on file  Social Connections: Not on file  Intimate Partner Violence: Not on file    Family History:    Family History  Problem Relation Age of Onset   Colon cancer Mother        mets, spot on lungs and spine   Coronary artery disease Father    Emphysema Father    Heart attack Brother        x 2   Colon cancer Paternal Uncle    Kidney disease Brother    Hypertension Brother    Stroke Neg Hx    Esophageal cancer Neg Hx      ROS:  Please see the history of present illness.   All other ROS reviewed and negative.     Physical Exam/Data:   Vitals:   06/07/21 0508 06/07/21 0623 06/07/21 0741 06/07/21 1145  BP: (!) 187/98 (!) 156/81 (!) 193/88 (!) 148/60  Pulse: (!) 59 63 63 66  Resp: 18 19 (!) 21 (!) 22  Temp: 98 F (36.7 C) 98.1 F (36.7 C) 98.1 F (36.7 C) 98.1 F (36.7  C)  TempSrc: Oral Oral Oral Oral  SpO2: 92% 98% 98% 97%  Weight:      Height:        Intake/Output Summary (Last 24 hours) at 06/07/2021 1514 Last data filed at 06/07/2021 1311 Gross per 24 hour  Intake 1026.6 ml  Output 2600 ml  Net -1573.4 ml   Last 3 Weights 06/07/2021 06/06/2021 06/05/2021  Weight (lbs) 212 lb 4.9 oz 212 lb 8 oz 213 lb 6.5 oz  Weight (kg) 96.3 kg 96.389 kg 96.8 kg     Body mass index is 47.6 kg/m.  General: Well developed, well nourished obese WF, in no acute distress. Head: Normocephalic, atraumatic, sclera non-icteric, no xanthomas, nares are without discharge. Neck: Negative for carotid bruits. JVP not elevated. Lungs: Diffuse rhonchi, coarse BS throughout, no wheezing.  Breathing is unlabored. Heart: RRR S1 S2 without murmurs, rubs, or gallops.  Abdomen: Soft, non-tender, non-distended with normoactive bowel sounds. No rebound/guarding. Extremities: No clubbing or cyanosis. No edema. Large baseline leg habitus, pliable, nonpitting. Distal pedal pulses are 2+ and equal bilaterally. Neuro: Alert and oriented X 3. Moves all extremities spontaneously. Psych:  Responds to questions appropriately with a normal affect.   EKG:  The EKG was personally reviewed and demonstrates:  NSR 77bpm, baseline artifact, no acute STT changes  Telemetry:  Telemetry was personally reviewed and demonstrates:  NSR, occasional PACs (brief atrial run yesterday AM)  Relevant CV Studies: 2D Echo 06/06/21   1. Left ventricular ejection fraction, by estimation, is 70 to 75%. The  left ventricle has hyperdynamic function. The left ventricle has no  regional wall motion abnormalities. There is mild concentric left  ventricular hypertrophy. Left ventricular  diastolic parameters are consistent with Grade II diastolic dysfunction  (pseudonormalization). Elevated left ventricular end-diastolic pressure.   2. Right ventricular systolic function is normal. The right ventricular  size is normal. There is moderately elevated pulmonary artery systolic  pressure. The estimated right ventricular systolic pressure is 75.3 mmHg.   3. Left atrial size was mild to moderately dilated.   4. The mitral valve is abnormal. Mild to moderate mitral valve  regurgitation. Moderate mitral annular calcification.   5. The aortic valve is tricuspid. Aortic valve regurgitation is not  visualized.   6. The inferior vena cava is normal in size with greater than 50%  respiratory variability, suggesting right atrial pressure of 3 mmHg.   Comparison(s): Prior images reviewed side by side. LVEF vigorous with  moderate diastolic dysfunction. Mild to moderate mitral regurgitation and  moderately elevated RVSP.    Laboratory Data:  High Sensitivity Troponin:   Recent Labs  Lab 06/05/21 0233 06/05/21 0658 06/05/21 1503 06/05/21 1703  TROPONINIHS 22* 56* 56* 56*     Chemistry Recent Labs  Lab 06/05/21 0233 06/06/21 0427 06/07/21 0733  NA 138 137 143  K 5.1 3.8 2.9*  CL 101 95* 98  CO2 28 29 32  GLUCOSE 101* 126* 98  BUN _0 CREATININE 1.01* 1.13* 1.05*  CALCIUM 9.4 8.8* 8.4*  MG  --   --  1.5*  GFRNONAA 58* 51* 56*  ANIONGAP _1 Recent Labs  Lab 06/05/21 1703 06/06/21 1448 06/07/21 0733  PROT 6.4* 7.1 6.4*  ALBUMIN 3.1* 3.2* 3.0*  AST 40 46* 29  ALT _2 ALKPHOS 68 66 58  BILITOT 1.3* 1.0 0.5   Lipids No results for input(s): CHOL, TRIG, HDL, LABVLDL, LDLCALC, CHOLHDL in the last 168 hours.  Hematology Recent Labs  Lab 06/05/21 0233 06/06/21 0427 06/07/21 0733  WBC 11.4* 9.4 9.4  RBC 4.46 4.45 4.22  HGB 15.1* 14.8 14.2  HCT 46.8* 45.2 42.5  MCV 104.9* 101.6* 100.7*  MCH 33.9 33.3 33.6  MCHC 32.3 32.7 33.4  RDW 13.2 13.0 13.1  PLT 171 138* 157   Thyroid  Recent Labs  Lab 06/05/21 2058  TSH 0.424    BNP Recent Labs  Lab 06/05/21 0233  BNP 303.7*    DDimer No results for input(s): DDIMER in the last 168 hours.   Radiology/Studies:  CT Chest Wo Contrast  Result Date: 06/05/2021 CLINICAL DATA:  75 year old female with shortness of breath. EXAM: CT CHEST WITHOUT CONTRAST TECHNIQUE: Multidetector CT imaging of the chest was performed following the standard protocol without IV contrast. COMPARISON:  Portable chest 0231 hours today.  Chest CT 08/13/2015. FINDINGS: Cardiovascular: Advanced calcified coronary artery atherosclerosis and/or stents. Prosthetic mitral valve. Mild cardiomegaly. No pericardial effusion. Mild-to-moderate Calcified aortic atherosclerosis. Mediastinum/Nodes: Superior left chest loop recorder or lead Liss ICD. No mediastinal lymphadenopathy. Lungs/Pleura: Layering retained secretions in the right mainstem bronchus and  bronchus intermedius. Other major airways are patent. Bilateral bronchiectasis has progressed since 2017. Left greater than right lower lobe peribronchial nodularity and patchy opacity is superimposed on chronic lower lobe scarring. Upper lung mild tree-in-bud nodular opacity appears more chronic and stable. No consolidation. No pleural effusion. Upper Abdomen: Large lamellated gallstone in the visible gallbladder is 37 mm diameter. No pericholecystic inflammation is evident. Otherwise negative visible noncontrast liver, spleen, pancreas, adrenal glands, kidneys and bowel in the upper abdomen. Musculoskeletal: Chronically augmented T11 and T12 compression fractures appears stable since 2017. Underlying osteopenia. No acute osseous abnormality identified. IMPRESSION: 1. Widespread progressed Bronchiectasis since 2017. Bilateral lower lobe peribronchial nodularity and patchy opacity is compatible with active infection superimposed on chronic lung disease. Small volume of retained or aspirated secretions in the right mainstem bronchus. Consider both conventional and atypical (MAI) infectious etiology. No pleural effusion. 2. Advanced calcified coronary artery atherosclerosis. Mild cardiomegaly. Prosthetic mitral valve. Aortic Atherosclerosis (ICD10-I70.0). 3. Cholelithiasis. 4. Previously augmented T11 and T12 compression fractures. Electronically Signed   By: Genevie Ann M.D.   On: 06/05/2021 09:58   US Abdomen Complete  Result Date: 06/05/2021 CLINICAL DATA:  Abdomen pain EXAM: ABDOMEN ULTRASOUND COMPLETE COMPARISON:  Ultrasound 06/14/2016, CT 05/25/2016 FINDINGS: Gallbladder: Shadowing stone. Slight increased wall thickness at 3.9 mm. Positive sonographic Murphy. Common bile duct: Diameter: Dilated up to 11 mm. Liver: No focal lesion identified. Within normal limits in parenchymal echogenicity. Portal vein is patent on color Doppler imaging with normal direction of blood flow towards the liver. IVC: No abnormality  visualized. Pancreas: Visualized portion unremarkable. Spleen: Size and appearance within normal limits. Right Kidney: Length: 8.8 cm. Echogenicity within normal limits. No hydronephrosis. Cortical thinning is present Left Kidney: Length: 9.1 cm. Echogenicity within normal limits. No mass or hydronephrosis. Cortical thinning is present. Abdominal aorta: No aneurysm visualized. Other findings: None. IMPRESSION: 1. Cholelithiasis with slight increased wall thickness and positive sonographic Murphy suspicious for an acute cholecystitis. Dilated common bile duct up to 11 mm, correlate with LFTs with follow-up MRCP as indicated 2. Bilateral renal cortical thinning consistent with atrophy. No hydronephrosis Electronically Signed   By: Donavan Foil M.D.   On: 06/05/2021 19:18   DG Chest Portable 1 View  Result Date: 06/05/2021 CLINICAL DATA:  Shortness of breath. EXAM: PORTABLE CHEST 1 VIEW COMPARISON:  08/28/2020. FINDINGS: The heart is enlarged and the  mediastinal contours are within normal limits. There is chronic elevation of the left diaphragm and low lung volumes with mild atelectasis at the lung bases. No consolidation, effusion, or pneumothorax. A loop recorder device is present over the left chest. No acute osseous abnormality. IMPRESSION: Cardiomegaly with no acute process Electronically Signed   By: Brett Fairy M.D.   On: 06/05/2021 02:45   ECHOCARDIOGRAM COMPLETE  Result Date: 06/06/2021    ECHOCARDIOGRAM REPORT   Patient Name:   [name] Date of Exam: 06/06/2021 Medical Rec #:  [id number]    Height:       56.0 in Accession #:    [id number]   Weight:       212.5 lb Date of Birth:  1946/01/08     BSA:          1.822 m Patient Age:    33 years     BP:           142/77 mmHg Patient Gender: F            HR:           67 bpm. Exam Location:  Inpatient Procedure: 2D Echo Indications:    congestive heart failure  History:        Patient has prior history of Echocardiogram examinations, most                  recent 11/02/2020. CHF, CAD, Arrythmias:paroxysmal a-fib; Risk                 Factors:Hypertension.  Sonographer:    Johny Chess RDCS Referring Phys: [id number] RONDELL A SMITH  Sonographer Comments: Image acquisition challenging due to respiratory motion and coughing. IMPRESSIONS  1. Left ventricular ejection fraction, by estimation, is 70 to 75%. The left ventricle has hyperdynamic function. The left ventricle has no regional wall motion abnormalities. There is mild concentric left ventricular hypertrophy. Left ventricular diastolic parameters are consistent with Grade II diastolic dysfunction (pseudonormalization). Elevated left ventricular end-diastolic pressure.  2. Right ventricular systolic function is normal. The right ventricular size is normal. There is moderately elevated pulmonary artery systolic pressure. The estimated right ventricular systolic pressure is 36.1 mmHg.  3. Left atrial size was mild to moderately dilated.  4. The mitral valve is abnormal. Mild to moderate mitral valve regurgitation. Moderate mitral annular calcification.  5. The aortic valve is tricuspid. Aortic valve regurgitation is not visualized.  6. The inferior vena cava is normal in size with greater than 50% respiratory variability, suggesting right atrial pressure of 3 mmHg. Comparison(s): Prior images reviewed side by side. LVEF vigorous with moderate diastolic dysfunction. Mild to moderate mitral regurgitation and moderately elevated RVSP. FINDINGS  Left Ventricle: Left ventricular ejection fraction, by estimation, is 70 to 75%. The left ventricle has hyperdynamic function. The left ventricle has no regional wall motion abnormalities. The left ventricular internal cavity size was small. There is mild concentric left ventricular hypertrophy. Left ventricular diastolic parameters are consistent with Grade II diastolic dysfunction (pseudonormalization). Elevated left ventricular end-diastolic pressure. Right Ventricle: The  right ventricular size is normal. No increase in right ventricular wall thickness. Right ventricular systolic function is normal. There is moderately elevated pulmonary artery systolic pressure. The tricuspid regurgitant velocity is 3.31 m/s, and with an assumed right atrial pressure of 3 mmHg, the estimated right ventricular systolic pressure is 22.4 mmHg. Left Atrium: Left atrial size was mild to moderately dilated. Right Atrium: Right atrial size was normal in size. Pericardium: There  is no evidence of pericardial effusion. Presence of epicardial fat layer. Mitral Valve: The mitral valve is abnormal. There is mild thickening of the mitral valve leaflet(s). There is mild calcification of the mitral valve leaflet(s). Moderate mitral annular calcification. Mild to moderate mitral valve regurgitation. Tricuspid Valve: The tricuspid valve is grossly normal. Tricuspid valve regurgitation is mild. Aortic Valve: The aortic valve is tricuspid. There is mild aortic valve annular calcification. Aortic valve regurgitation is not visualized. Pulmonic Valve: The pulmonic valve was grossly normal. Pulmonic valve regurgitation is trivial. Aorta: The aortic root is normal in size and structure. Venous: The inferior vena cava is normal in size with greater than 50% respiratory variability, suggesting right atrial pressure of 3 mmHg. IAS/Shunts: No atrial level shunt detected by color flow Doppler.  LEFT VENTRICLE PLAX 2D LVIDd:         4.20 cm   Diastology LVIDs:         2.50 cm   LV e' medial:    6.42 cm/s LV PW:         1.00 cm   LV E/e' medial:  17.8 LV IVS:        1.10 cm   LV e' lateral:   6.64 cm/s LVOT diam:     1.80 cm   LV E/e' lateral: 17.2 LV SV:         55 LV SV Index:   30 LVOT Area:     2.54 cm  RIGHT VENTRICLE             IVC RV S prime:     15.60 cm/s  IVC diam: 1.80 cm TAPSE (M-mode): 2.0 cm LEFT ATRIUM             Index        RIGHT ATRIUM           Index LA diam:        4.30 cm 2.36 cm/m   RA Area:     12.90  cm LA Vol (A2C):   58.6 ml 32.17 ml/m  RA Volume:   29.00 ml  15.92 ml/m LA Vol (A4C):   72.5 ml 39.80 ml/m LA Biplane Vol: 68.9 ml 37.83 ml/m  AORTIC VALVE LVOT Vmax:   104.00 cm/s LVOT Vmean:  68.300 cm/s LVOT VTI:    0.217 m  AORTA Ao Root diam: 2.80 cm Ao Asc diam:  2.80 cm MITRAL VALVE                TRICUSPID VALVE MV Area (PHT): 2.87 cm     TR Peak grad:   43.8 mmHg MV Decel Time: 264 msec     TR Vmax:        331.00 cm/s MV E velocity: 114.00 cm/s MV A velocity: 66.30 cm/s   SHUNTS MV E/A ratio:  1.72         Systemic VTI:  0.22 m                             Systemic Diam: 1.80 cm Rozann Lesches MD Electronically signed by Rozann Lesches MD Signature Date/Time: 06/06/2021/1:07:57 PM    Final      Assessment and Plan:   1. Preoperative risk stratification for cholecystectomy - at baseline pt appears to be at increased baseline risk given morbid obesity, hypoxia, bronchiectasis, CAD, diastolic CHF and pulm HTN but at present time not clear that any additional testing would be beneficial  prior to surgery from cardiac standpoint - will review with Dr. Stanford Breed  2. Acute hypoxic respiratory failure felt due to CAP +/- aspiration with bronchiectasis - managed by primary team - consider pulm consultation, will need f/u with their team  3. Paroxysmal atrial fibrillation with history of ILR - maintaining NSR on home regimen (Toprol, Sotalol) - Eliquis on hold in lieu of heparin due to potential need for surgery  4. Chronic diastolic CHF with findings of moderate pulmonary HTN and mild-moderate MR - received IV Lasix on arrival with good UOP - now appears euvolemic - continue home Lasix dose  5. Known CAD, hyperlipidemia, mildly elevated troponins - not on ASA due to concomitant Eliquis - continue BB, statin - will discuss further evaluation with MD  6. Hypothyroidism - TSH wnl   Risk Assessment/Risk Scores:  { New York Heart Association (NYHA) Functional Class NYHA Class  III  CHA2DS2-VASc Score = 5   This indicates a 7.2% annual risk of stroke. The patient's score is based upon: CHF History: 1 HTN History: 1 Diabetes History: 0 Stroke History: 0 Vascular Disease History: 1 Age Score: 1 Gender Score: 1    For questions or updates, please contact [place] Please consult [email or web address] for contact info under   Signed, Charlie Pitter, PA-C  06/07/2021 3:14 PM As above, patient seen and examined.  Briefly she is a 75 year old female with past medical history of moderate nonobstructive coronary disease by previous catheterization, chronic diastolic dysfunction, paroxysmal atrial fibrillation, hypertension, hyperlipidemia, MAT, hypothyroidism for preoperative evaluation prior to cholecystectomy.  Most recent nuclear study March 2020 showed ejection fraction 43% and otherwise normal perfusion.  Echocardiogram this admission showed ejection fraction 70 to 75%, mild left ventricular hypertrophy, grade 2 diastolic dysfunction, mild to moderate left atrial enlargement, mild to moderate mitral regurgitation.  She states that approximately 2 weeks ago she choked on a pill and then developed coughing which has progressively worsened.  She also describes increasing shortness of breath.  There is no fevers, chills or productive cough.  She did describe chest discomfort but this increased with cough.  Prior to the above events she has limited mobility and has some dyspnea on exertion but denies exertional chest pain.  She has been found to have cholecystitis and we were asked to evaluate preoperatively. Troponin 22, 56, 56, BNP 303.  Electrocardiogram shows normal sinus rhythm with no ST changes.  1 preoperative evaluation prior to cholecystectomy-patient has not had chest pain and had a negative nuclear study in 2020.  Her LV function is normal on echocardiogram and she is not volume overloaded.  I do not think further cardiac testing would be indicated prior to her  procedure.  She would be acceptable risk from a cardiac standpoint.  There is likely increased risk from her other medical problems including bronchiectasis.  2 history of paroxysmal atrial fibrillation-patient remains in sinus rhythm.  Would continue Toprol and sotalol.  Resume apixaban once surgery complete.  3 chronic diastolic congestive heart failure-she appears to be euvolemic.  Continue home dose of Lasix.  4 coronary artery disease-continue statin.  5 cholecystitis-Per primary service.  Kirk Ruths, MD

## 2021-06-07 NOTE — Progress Notes (Signed)
ANTICOAGULATION CONSULT NOTE - Follow Up Consult  Pharmacy Consult for Heparin Indication: atrial fibrillation  Allergies  Allergen Reactions   Butoconazole Itching, Rash and Other (See Comments)    'Redness and swelling feverish ' 'worse symptoms '   Keflex [Cephalexin] Diarrhea   Lipitor [Atorvastatin Calcium] Other (See Comments)    'Muscle weakness'     Cortisone     This medication causes confusion   Hydrocodone Other (See Comments)    Patient Measurements: Height: 4\' 8"  (142.2 cm) Weight: 96.3 kg (212 lb 4.9 oz) IBW/kg (Calculated) : 36.3 Heparin Dosing Weight: 62 kg  Vital Signs: Temp: 98.1 F (36.7 C) (12/26 0741) Temp Source: Oral (12/26 0741) BP: 193/88 (12/26 0741) Pulse Rate: 63 (12/26 0741)  Labs: Recent Labs    06/05/21 0233 06/05/21 0658 06/05/21 1503 06/05/21 1703 06/06/21 0427 06/07/21 0733  HGB 15.1*  --   --   --  14.8 14.2  HCT 46.8*  --   --   --  45.2 42.5  PLT 171  --   --   --  138* 157  APTT  --   --   --   --   --  47*  HEPARINUNFRC  --   --   --   --   --  >1.10*  CREATININE 1.01*  --   --   --  1.13* 1.05*  TROPONINIHS 22* 56* 56* 56*  --   --     Estimated Creatinine Clearance: 44.7 mL/min (A) (by C-G formula based on SCr of 1.05 mg/dL (H)).  Assessment:  75 yr old female on Apixaban 5 mg BID PTA for atrial fibrillation. Apixaban stopped 12/25, with plan for lap chole on 12/27 am. Last Apixaban dose 0922 am.  Transitioned to IV heparin ~12 hours later.  Using aPTTs for heparin monitoring while heparin levels are falsely elevated due to recent Apixaban doses.    Initial aPTT is subtherapeutic (47 seconds) on 850 units/hr. Heparin level >1.10 falsely elevated.  Goal of Therapy:  Heparin level 0.3-0.7 units/ml aPTT 66-102 seconds Monitor platelets by anticoagulation protocol: Yes   Plan:  Increase heparin drip to 1050 units/hr aPTT ~8 hrs after rate change. Daily aPTT and heparin level until correlating. Apixaban on hold for  gallbladder surgery, scheduled for 0745 on 12/27. Cardiac clearance pending. Will follow up for timing to hold heparin drip pre-op.  Arty Baumgartner, [place] 06/07/2021,9:06 AM

## 2021-06-07 NOTE — Progress Notes (Signed)
PROGRESS NOTE    DORLENE FOOTMAN  ZTI:[id number] DOB: 09-08-1945 DOA: 06/05/2021 PCP: [name], DO   Brief Narrative:  PERLITA FORBUSH is a 75 y.o. female with medical history significant of HTN, hyperlipidemia, CAD, diastolic CHF, A. fib on chronic anticoagulation, hypothyroidism, and morbid obesity who presented with complaints of cough and shortness of breath for approximately 3 weeks.  Her husband has been coughing as well, and both have tested negative for COVID-19 on 2 separate occasions.  In route with EMS O2 saturations as low as 83% on room air.   Upon admission to the emergency department patient was seen to be afebrile, respirations 19-32, blood pressure elevated to 210/116, and O2 saturations initially maintained on 8 L of nasal cannula oxygen.  Labs significant for WBC 11.4, hemoglobin 15.1 BUN 22, creatinine 1.01, glucose 101, BNP 303.7, and high-sensitivity troponin 22-> 56.  CT scan of the chest noted widespread progression of bronchiectasis since 2017 with bilateral lower lobe nodular and patchy opacity compatible with active infection superimposed on chronic lung disease with retained or aspirated secretions of the right mainstem bronchus.  Patient has been given Lasix 80 mg IV, DuoNeb breathing treatment, and Levaquin.  Assessment & Plan:   Principal Problem:   CAP (community acquired pneumonia) Active Problems:   CAD (coronary artery disease)   Morbid obesity with BMI of 50.0-59.9, adult (HCC)   Chronic diastolic CHF (congestive heart failure) (HCC)   PAF (paroxysmal atrial fibrillation) (HCC)   Acute respiratory failure with hypoxia (HCC)   Elevated troponin   Hypothyroidism   Leukocytosis  Acute respiratory failure with hypoxia secondary pneumonia  bronchiectasis: Leukocytosis 11.4 with some tachypnea but does not meet SIRS criteria and thus does not meet sepsis.  She states that her breathing is better than yesterday however she still has coarse breath sounds with  rhonchi bilaterally.  She is afebrile.  We will continue Rocephin and doxycycline.  Encouraged incentive spirometry.  Continue prednisone for now.   Chest pain: Troponin slightly elevated but flat.  Echo shows normal ejection fraction with no wall motion abnormality but diastolic dysfunction.  Likely demand ischemia.  Hypertensive urgency: Blood pressures elevated up to 210/116 while in the ED.  Home blood pressure regimen includes losartan 25 mg daily, furosemide 40 mg twice daily, metoprolol 37.5 mg twice daily, and sotalol 80 mg twice daily.   -Continue losartan, sotalol, and metoprolol.  Blood pressure now much better controlled.   Chronic diastolic congestive heart failure:  On physical exam patient without significant lower extremity swelling.  No pulmonary edema or vascular congestion on the chest x-ray.  BNP is slightly elevated but we do not know her baseline.  Appears euvolemic on exam.  Continue dose of oral Lasix.  Echo as mentioned above.  Cholelithiasis with acute cholecystitis: Abdominal ultrasound suspicious for acute cholecystitis.  Her total bilirubin is elevated initially and CBD is dilated.  Bilirubin improved.  She has positive Murphy sign as well.  HIDA scan ordered but still pending due to holiday schedule.  Consulted neurosurgery, patient was seen by Dr. Bobbye Morton who recommended acute cholecystectomy however patient's last dose of Eliquis was in the morning of 06/06/2021 so the surgery is planned for tomorrow.  General surgery is requesting cardiology consultation for cardiac clearance, I have consulted them.  Continue Rocephin and Flagyl.  Hypomagnesemia/hypokalemia: Replace both of them   Paroxysmal atrial fibrillation on chronic anticoagulation: Patient appears to be in sinus rhythm at this time.  Continue Lopressor and IV heparin  for now, hold Eliquis.   Hypothyroidism: Continue Synthroid.   Hyperlipidemia -Continue Crestor   Morbid obesity: BMI previously 51.07 kg/m:  Weight loss counseled.  DVT prophylaxis:    Code Status: Full Code  Family Communication:  none present at bedside.  Plan of care discussed with patient in length and he verbalized understanding and agreed with it.  Status is: Inpatient  Remains inpatient appropriate because: Needs inpatient medical management for medical issues as mentioned above.  Estimated body mass index is 47.6 kg/m as calculated from the following:   Height as of this encounter: 4\' 8"  (1.422 m).   Weight as of this encounter: 96.3 kg.     Nutritional Assessment: Body mass index is 47.6 kg/m.Marland Kitchen Seen by dietician.  I agree with the assessment and plan as outlined below: Nutrition Status:  Skin Assessment: I have examined the patient's skin and I agree with the wound assessment as performed by the wound care RN as outlined below:    Consultants:  General surgery  Procedures:  None  Antimicrobials:  Anti-infectives (From admission, onward)    Start     Dose/Rate Route Frequency Ordered Stop   06/06/21 1615  metroNIDAZOLE (FLAGYL) IVPB 500 mg        500 mg 100 mL/hr over 60 Minutes Intravenous Every 8 hours 06/06/21 1523     06/06/21 1515  metroNIDAZOLE (FLAGYL) IVPB 500 mg  Status:  Discontinued        500 mg 100 mL/hr over 60 Minutes Intravenous  Once 06/06/21 1423 06/06/21 1523   06/06/21 1000  cefTRIAXone (ROCEPHIN) 2 g in sodium chloride 0.9 % 100 mL IVPB  Status:  Discontinued        2 g 200 mL/hr over 30 Minutes Intravenous Every 24 hours 06/05/21 1345 06/05/21 1349   06/06/21 1000  cefTRIAXone (ROCEPHIN) 2 g in sodium chloride 0.9 % 100 mL IVPB        2 g 200 mL/hr over 30 Minutes Intravenous Every 24 hours 06/05/21 1349 06/10/21 0959   06/06/21 0000  doxycycline (VIBRAMYCIN) 100 mg in sodium chloride 0.9 % 250 mL IVPB        100 mg 125 mL/hr over 120 Minutes Intravenous Every 12 hours 06/05/21 1350 06/09/21 2359   06/05/21 1015  levofloxacin (LEVAQUIN) IVPB 750 mg        750 mg 100 mL/hr  over 90 Minutes Intravenous  Once 06/05/21 1002 06/05/21 1222          Subjective:  Patient seen and examined.  She states that her breathing is better and abdominal pain is improving as well.  No new complaint.  Objective: Vitals:   06/07/21 0508 06/07/21 0623 06/07/21 0741 06/07/21 1145  BP: (!) 187/98 (!) 156/81 (!) 193/88 (!) 148/60  Pulse: (!) 59 63 63 66  Resp: 18 19 (!) 21 (!) 22  Temp: 98 F (36.7 C) 98.1 F (36.7 C) 98.1 F (36.7 C) 98.1 F (36.7 C)  TempSrc: Oral Oral Oral Oral  SpO2: 92% 98% 98% 97%  Weight:      Height:        Intake/Output Summary (Last 24 hours) at 06/07/2021 1257 Last data filed at 06/07/2021 1034 Gross per 24 hour  Intake 1316.63 ml  Output 2600 ml  Net -1283.37 ml    Filed Weights   06/05/21 2024 06/06/21 0655 06/07/21 0016  Weight: 96.8 kg 96.4 kg 96.3 kg    Examination:  General exam: Appears calm and comfortable, morbidly obese Respiratory  system: Coarse breath sounds with rhonchi bilaterally. Respiratory effort normal. Cardiovascular system: S1 & S2 heard, RRR. No JVD, murmurs, rubs, gallops or clicks.  Trace pitting edema bilateral lower extremity Gastrointestinal system: Abdomen is nondistended, soft and right upper quadrant tenderness with positive Murphy sign. No organomegaly or masses felt. Normal bowel sounds heard. Central nervous system: Alert and oriented. No focal neurological deficits. Extremities: Symmetric 5 x 5 power. Skin: No rashes, lesions or ulcers.  Psychiatry: Judgement and insight appear normal. Mood & affect appropriate.   Data Reviewed: I have personally reviewed following labs and imaging studies  CBC: Recent Labs  Lab 06/05/21 0233 06/06/21 0427 06/07/21 0733  WBC 11.4* 9.4 9.4  NEUTROABS 8.3*  --  6.1  HGB 15.1* 14.8 14.2  HCT 46.8* 45.2 42.5  MCV 104.9* 101.6* 100.7*  PLT 171 138* 397    Basic Metabolic Panel: Recent Labs  Lab 06/05/21 0233 06/06/21 0427 06/07/21 0733  NA 138 137  143  K 5.1 3.8 2.9*  CL 101 95* 98  CO2 28 29 32  GLUCOSE 101* 126* 98  BUN 22 21 23   CREATININE 1.01* 1.13* 1.05*  CALCIUM 9.4 8.8* 8.4*  MG  --   --  1.5*    GFR: Estimated Creatinine Clearance: 44.7 mL/min (A) (by C-G formula based on SCr of 1.05 mg/dL (H)). Liver Function Tests: Recent Labs  Lab 06/05/21 1703 06/06/21 1448 06/07/21 0733  AST 40 46* 29  ALT 26 23 23   ALKPHOS 68 66 58  BILITOT 1.3* 1.0 0.5  PROT 6.4* 7.1 6.4*  ALBUMIN 3.1* 3.2* 3.0*    No results for input(s): LIPASE, AMYLASE in the last 168 hours. No results for input(s): AMMONIA in the last 168 hours. Coagulation Profile: No results for input(s): INR, PROTIME in the last 168 hours. Cardiac Enzymes: No results for input(s): CKTOTAL, CKMB, CKMBINDEX, TROPONINI in the last 168 hours. BNP (last 3 results) Recent Labs    03/15/21 1227  PROBNP 822*    HbA1C: No results for input(s): HGBA1C in the last 72 hours. CBG: No results for input(s): GLUCAP in the last 168 hours. Lipid Profile: No results for input(s): CHOL, HDL, LDLCALC, TRIG, CHOLHDL, LDLDIRECT in the last 72 hours. Thyroid Function Tests: Recent Labs    06/05/21 2058  TSH 0.424    Anemia Panel: No results for input(s): VITAMINB12, FOLATE, FERRITIN, TIBC, IRON, RETICCTPCT in the last 72 hours. Sepsis Labs: Recent Labs  Lab 06/05/21 0703 06/06/21 0427 06/07/21 0733  PROCALCITON <0.10 <0.10 <0.10     Recent Results (from the past 240 hour(s))  Resp Panel by RT-PCR (Flu A&B, Covid) Nasopharyngeal Swab     Status: None   Collection Time: 06/05/21  2:39 AM   Specimen: Nasopharyngeal Swab; Nasopharyngeal(NP) swabs in vial transport medium  Result Value Ref Range Status   SARS Coronavirus 2 by RT PCR NEGATIVE NEGATIVE Final    Comment: (NOTE) SARS-CoV-2 target nucleic acids are NOT DETECTED.  The SARS-CoV-2 RNA is generally detectable in upper respiratory specimens during the acute phase of infection. The  lowest concentration of SARS-CoV-2 viral copies this assay can detect is 138 copies/mL. A negative result does not preclude SARS-Cov-2 infection and should not be used as the sole basis for treatment or other patient management decisions. A negative result may occur with  improper specimen collection/handling, submission of specimen other than nasopharyngeal swab, presence of viral mutation(s) within the areas targeted by this assay, and inadequate number of viral copies(<138 copies/mL). A negative  result must be combined with clinical observations, patient history, and epidemiological information. The expected result is Negative.  Fact Sheet for Patients:  EntrepreneurPulse.com.au  Fact Sheet for Healthcare Providers:  IncredibleEmployment.be  This test is no t yet approved or cleared by the [place] FDA and  has been authorized for detection and/or diagnosis of SARS-CoV-2 by FDA under an Emergency Use Authorization (EUA). This EUA will remain  in effect (meaning this test can be used) for the duration of the COVID-19 declaration under Section 564(b)(1) of the Act, 21 U.S.C.section 360bbb-3(b)(1), unless the authorization is terminated  or revoked sooner.       Influenza A by PCR NEGATIVE NEGATIVE Final   Influenza B by PCR NEGATIVE NEGATIVE Final    Comment: (NOTE) The Xpert Xpress SARS-CoV-2/FLU/RSV plus assay is intended as an aid in the diagnosis of influenza from Nasopharyngeal swab specimens and should not be used as a sole basis for treatment. Nasal washings and aspirates are unacceptable for Xpert Xpress SARS-CoV-2/FLU/RSV testing.  Fact Sheet for Patients: EntrepreneurPulse.com.au  Fact Sheet for Healthcare Providers: IncredibleEmployment.be  This test is not yet approved or cleared by the [place] FDA and has been authorized for detection and/or diagnosis of SARS-CoV-2 by FDA under  an Emergency Use Authorization (EUA). This EUA will remain in effect (meaning this test can be used) for the duration of the COVID-19 declaration under Section 564(b)(1) of the Act, 21 U.S.C. section 360bbb-3(b)(1), unless the authorization is terminated or revoked.  Performed at [facility], [place] [address] [place], [place] [address]   Urine Culture     Status: Abnormal   Collection Time: 06/05/21  9:12 AM   Specimen: Urine, Clean Catch  Result Value Ref Range Status   Specimen Description URINE, CLEAN CATCH  Final   Special Requests NONE  Final   Culture (A)  Final    30,000 COLONIES/mL DIPHTHEROIDS(CORYNEBACTERIUM SPECIES) Standardized susceptibility testing for this organism is not available. Performed at [facility], [place] [address], [place], [place] [address]    Report Status 06/06/2021 FINAL  Final       Radiology Studies: US Abdomen Complete  Result Date: 06/05/2021 CLINICAL DATA:  Abdomen pain EXAM: ABDOMEN ULTRASOUND COMPLETE COMPARISON:  Ultrasound 06/14/2016, CT 05/25/2016 FINDINGS: Gallbladder: Shadowing stone. Slight increased wall thickness at 3.9 mm. Positive sonographic Murphy. Common bile duct: Diameter: Dilated up to 11 mm. Liver: No focal lesion identified. Within normal limits in parenchymal echogenicity. Portal vein is patent on color Doppler imaging with normal direction of blood flow towards the liver. IVC: No abnormality visualized. Pancreas: Visualized portion unremarkable. Spleen: Size and appearance within normal limits. Right Kidney: Length: 8.8 cm. Echogenicity within normal limits. No hydronephrosis. Cortical thinning is present Left Kidney: Length: 9.1 cm. Echogenicity within normal limits. No mass or hydronephrosis. Cortical thinning is present. Abdominal aorta: No aneurysm visualized. Other findings: None. IMPRESSION: 1. Cholelithiasis with slight increased wall thickness and positive sonographic Murphy suspicious for an acute cholecystitis.  Dilated common bile duct up to 11 mm, correlate with LFTs with follow-up MRCP as indicated 2. Bilateral renal cortical thinning consistent with atrophy. No hydronephrosis Electronically Signed   By: Donavan Foil M.D.   On: 06/05/2021 19:18   ECHOCARDIOGRAM COMPLETE  Result Date: 06/06/2021    ECHOCARDIOGRAM REPORT   Patient Name:   MARIT GOODWILL Date of Exam: 06/06/2021 Medical Rec #:  [id number]    Height:       56.0 in Accession #:  [id number]   Weight:       212.5 lb Date of Birth:  [birth year]     BSA:          1.822 m Patient Age:    37 years     BP:           142/77 mmHg Patient Gender: F            HR:           67 bpm. Exam Location:  Inpatient Procedure: 2D Echo Indications:    congestive heart failure  History:        Patient has prior history of Echocardiogram examinations, most                 recent 11/02/2020. CHF, CAD, Arrythmias:paroxysmal a-fib; Risk                 Factors:Hypertension.  Sonographer:    Johny Chess RDCS Referring Phys: [id number] RONDELL A SMITH  Sonographer Comments: Image acquisition challenging due to respiratory motion and coughing. IMPRESSIONS  1. Left ventricular ejection fraction, by estimation, is 70 to 75%. The left ventricle has hyperdynamic function. The left ventricle has no regional wall motion abnormalities. There is mild concentric left ventricular hypertrophy. Left ventricular diastolic parameters are consistent with Grade II diastolic dysfunction (pseudonormalization). Elevated left ventricular end-diastolic pressure.  2. Right ventricular systolic function is normal. The right ventricular size is normal. There is moderately elevated pulmonary artery systolic pressure. The estimated right ventricular systolic pressure is 42.5 mmHg.  3. Left atrial size was mild to moderately dilated.  4. The mitral valve is abnormal. Mild to moderate mitral valve regurgitation. Moderate mitral annular calcification.  5. The aortic valve is tricuspid. Aortic valve  regurgitation is not visualized.  6. The inferior vena cava is normal in size with greater than 50% respiratory variability, suggesting right atrial pressure of 3 mmHg. Comparison(s): Prior images reviewed side by side. LVEF vigorous with moderate diastolic dysfunction. Mild to moderate mitral regurgitation and moderately elevated RVSP. FINDINGS  Left Ventricle: Left ventricular ejection fraction, by estimation, is 70 to 75%. The left ventricle has hyperdynamic function. The left ventricle has no regional wall motion abnormalities. The left ventricular internal cavity size was small. There is mild concentric left ventricular hypertrophy. Left ventricular diastolic parameters are consistent with Grade II diastolic dysfunction (pseudonormalization). Elevated left ventricular end-diastolic pressure. Right Ventricle: The right ventricular size is normal. No increase in right ventricular wall thickness. Right ventricular systolic function is normal. There is moderately elevated pulmonary artery systolic pressure. The tricuspid regurgitant velocity is 3.31 m/s, and with an assumed right atrial pressure of 3 mmHg, the estimated right ventricular systolic pressure is 95.6 mmHg. Left Atrium: Left atrial size was mild to moderately dilated. Right Atrium: Right atrial size was normal in size. Pericardium: There is no evidence of pericardial effusion. Presence of epicardial fat layer. Mitral Valve: The mitral valve is abnormal. There is mild thickening of the mitral valve leaflet(s). There is mild calcification of the mitral valve leaflet(s). Moderate mitral annular calcification. Mild to moderate mitral valve regurgitation. Tricuspid Valve: The tricuspid valve is grossly normal. Tricuspid valve regurgitation is mild. Aortic Valve: The aortic valve is tricuspid. There is mild aortic valve annular calcification. Aortic valve regurgitation is not visualized. Pulmonic Valve: The pulmonic valve was grossly normal. Pulmonic valve  regurgitation is trivial. Aorta: The aortic root is normal in size and structure. Venous: The inferior vena cava is normal in size with  greater than 50% respiratory variability, suggesting right atrial pressure of 3 mmHg. IAS/Shunts: No atrial level shunt detected by color flow Doppler.  LEFT VENTRICLE PLAX 2D LVIDd:         4.20 cm   Diastology LVIDs:         2.50 cm   LV e' medial:    6.42 cm/s LV PW:         1.00 cm   LV E/e' medial:  17.8 LV IVS:        1.10 cm   LV e' lateral:   6.64 cm/s LVOT diam:     1.80 cm   LV E/e' lateral: 17.2 LV SV:         55 LV SV Index:   30 LVOT Area:     2.54 cm  RIGHT VENTRICLE             IVC RV S prime:     15.60 cm/s  IVC diam: 1.80 cm TAPSE (M-mode): 2.0 cm LEFT ATRIUM             Index        RIGHT ATRIUM           Index LA diam:        4.30 cm 2.36 cm/m   RA Area:     12.90 cm LA Vol (A2C):   58.6 ml 32.17 ml/m  RA Volume:   29.00 ml  15.92 ml/m LA Vol (A4C):   72.5 ml 39.80 ml/m LA Biplane Vol: 68.9 ml 37.83 ml/m  AORTIC VALVE LVOT Vmax:   104.00 cm/s LVOT Vmean:  68.300 cm/s LVOT VTI:    0.217 m  AORTA Ao Root diam: 2.80 cm Ao Asc diam:  2.80 cm MITRAL VALVE                TRICUSPID VALVE MV Area (PHT): 2.87 cm     TR Peak grad:   43.8 mmHg MV Decel Time: 264 msec     TR Vmax:        331.00 cm/s MV E velocity: 114.00 cm/s MV A velocity: 66.30 cm/s   SHUNTS MV E/A ratio:  1.72         Systemic VTI:  0.22 m                             Systemic Diam: 1.80 cm Rozann Lesches MD Electronically signed by Rozann Lesches MD Signature Date/Time: 06/06/2021/1:07:57 PM    Final     Scheduled Meds:  allopurinol  100 mg Oral BID   famotidine  20 mg Oral QHS   feeding supplement  237 mL Oral BID BM   furosemide  40 mg Oral BID   guaiFENesin  600 mg Oral BID   levothyroxine  88 mcg Oral Daily   losartan  50 mg Oral Daily   metoprolol succinate  37.5 mg Oral BID   potassium chloride  40 mEq Oral Q4H   predniSONE  40 mg Oral Q breakfast   rosuvastatin  5 mg Oral  QODAY   saccharomyces boulardii  250 mg Oral BID   sodium chloride flush  3 mL Intravenous Q12H   sotalol  80 mg Oral BID   Continuous Infusions:  cefTRIAXone (ROCEPHIN)  IV 2 g (06/07/21 1122)   doxycycline (VIBRAMYCIN) IV 100 mg (06/07/21 0043)   heparin 1,050 Units/hr (06/07/21 1133)   magnesium sulfate bolus IVPB  metronidazole 500 mg (06/07/21 0846)     LOS: 2 days   Time spent: 30 minutes   Darliss Cheney, MD [facility]  06/07/2021, 12:57 PM  Please page via [facility] and do not message via secure chat for anything urgent. Secure chat can be used for anything non urgent.  How to contact the [facility] Attending or Consulting provider [place] or covering provider during after hours [place], for this patient?  Check the care team in [facility] and look for a) attending/consulting [facility] provider listed and b) the [facility] team listed. Page or secure chat [phone]. Log into [email or web address] and use [place] universal password to access. If you do not have the password, please contact the hospital operator. Locate the [facility] provider you are looking for under [facility] and page to a number that you can be directly reached. If you still have difficulty reaching the provider, please page the [facility] (Director on Call) for the Hospitalists listed on amion for assistance.

## 2021-06-07 NOTE — Anesthesia Preprocedure Evaluation (Deleted)
Anesthesia Evaluation    History of Anesthesia Complications (+) Family history of anesthesia reaction  Airway        Dental   Pulmonary sleep apnea , pneumonia, unresolved,           Cardiovascular hypertension, Pt. on medications and Pt. on home beta blockers pulmonary hypertension+ angina with exertion + CAD and +CHF  + dysrhythmias Atrial Fibrillation + Valvular Problems/Murmurs MR   Troponins elevated on admission  EKG 06/05/21 NSR 77/ min, baseline arifact, low voltage in precordial leads  Echo 06/06/21 1. Left ventricular ejection fraction, by estimation, is 70 to 75%. The left ventricle has hyperdynamic function. The left ventricle has no regional wall motion abnormalities. There is mild concentric left ventricular hypertrophy. Left ventricular diastolic parameters are consistent with Grade II diastolic dysfunction (pseudonormalization). Elevated left ventricular end-diastolic pressure.  2. Right ventricular systolic function is normal. The right ventricular  size is normal. There is moderately elevated pulmonary artery systolic pressure. The estimated right ventricular systolic pressure is 99.3 mmHg.  3. Left atrial size was mild to moderately dilated.  4. The mitral valve is abnormal. Mild to moderate mitral valve regurgitation. Moderate mitral annular calcification.  5. The aortic valve is tricuspid. Aortic valve regurgitation is not visualized.  6. The inferior vena cava is normal in size with greater than 50% respiratory variability, suggesting right atrial pressure of 3 mmHg.   Cardiology clearance noted    Neuro/Psych Seizures -,   Neuromuscular disease negative psych ROS   GI/Hepatic Neg liver ROS, hiatal hernia, GERD  Medicated,Cholelithiasis with acute cholecystitis Hx/o Pancreatitis   Endo/Other  Hypothyroidism Morbid obesityHyperlipidemia  Renal/GU Renal InsufficiencyRenal disease  negative  genitourinary   Musculoskeletal  (+) Arthritis , Osteoarthritis,  Osteoporosis Dermatomyositis   Abdominal   Peds  Hematology  (+) anemia , Eliquis - last dose 12/23 Heparin therapy Thrombocytopenia on admission now resolved Sepsis   Anesthesia Other Findings   Reproductive/Obstetrics                             Anesthesia Physical Anesthesia Plan  ASA: 3  Anesthesia Plan: General   Post-op Pain Management:    Induction: Intravenous, Rapid sequence and Cricoid pressure planned  PONV Risk Score and Plan: 4 or greater and Treatment may vary due to age or medical condition and Ondansetron  Airway Management Planned: Oral ETT  Additional Equipment:   Intra-op Plan:   Post-operative Plan: Extubation in OR  Informed Consent:   Plan Discussed with:   Anesthesia Plan Comments:         Anesthesia Quick Evaluation

## 2021-06-07 NOTE — Progress Notes (Signed)
Initial Nutrition Assessment  DOCUMENTATION CODES:  Morbid obesity  INTERVENTION:  Continue Ensure BID.  Continue to encourage PO and supplement intake.  NUTRITION DIAGNOSIS:  Increased nutrient needs related to acute illness as evidenced by estimated needs.  GOAL:  Patient will meet greater than or equal to 90% of their needs  MONITOR:  PO intake, Supplement acceptance, Labs, Weight trends, I & O's  REASON FOR ASSESSMENT:  Malnutrition Screening Tool    ASSESSMENT:  75 yo female with a PMH of HTN, hyperlipidemia, CAD, diastolic CHF, A. fib on chronic anticoagulation, hypothyroidism, and morbid obesity who presented with complaints of cough and shortness of breath for approximately 3 weeks. Admitted with CAP.  RD working remotely. Attempted to call patient's room. Pt reports not being able to understand RD and hung up.  Per Epic, pt ate 100% of dinner yesterday and 100% of breakfast today.  Admit wt: 96.8 kg Current wt: 96.3 kg  Per Epic, pt has lost ~15 lbs (6.6%) in the last 2 months, which is significant and severe for the time frame. This may be a change in fluid weight given history of CHF.  Of note, pt with moderate BLE edema.  Continue Ensure BID.  Supplements: Ensure BID  Medications: reviewed; Pepcid, Lasix BID, Synthroid, prednisone, Mag Sulfate via IV once, Flagyl TID per IV  Labs: reviewed; K 2.9 (L)  NUTRITION - FOCUSED PHYSICAL EXAM: Unable to perform  Diet Order:   Diet Order             Diet Heart Room service appropriate? Yes; Fluid consistency: Thin  Diet effective now                  EDUCATION NEEDS:  Education needs have been addressed  Skin:  Skin Assessment: Reviewed RN Assessment  Last BM:  06/07/21 - Type 6, black, large  Height:  Ht Readings from Last 1 Encounters:  06/05/21 4\' 8"  (1.422 m)   Weight:  Wt Readings from Last 1 Encounters:  06/07/21 96.3 kg   BMI:  Body mass index is 47.6 kg/m.  Estimated Nutritional  Needs:  Kcal:  1700-1900 Protein:  95-110 grams Fluid:  >1.7 L  Derrel Nip, RD, LDN (she/her/hers) Clinical Inpatient Dietitian RD Pager/After-Hours/Weekend Pager # in [place]

## 2021-06-08 ENCOUNTER — Encounter (HOSPITAL_COMMUNITY): Payer: Self-pay | Admitting: Internal Medicine

## 2021-06-08 ENCOUNTER — Encounter (HOSPITAL_COMMUNITY)
Admission: EM | Disposition: A | Discharge status: Home Health | Payer: Self-pay | Source: Home / Self Care | Attending: Family Medicine

## 2021-06-08 ENCOUNTER — Inpatient Hospital Stay (HOSPITAL_COMMUNITY): Payer: Medicare Other

## 2021-06-08 ENCOUNTER — Other Ambulatory Visit (HOSPITAL_COMMUNITY): Payer: Medicare Other

## 2021-06-08 DIAGNOSIS — I2510 Atherosclerotic heart disease of native coronary artery without angina pectoris: Secondary | ICD-10-CM | POA: Diagnosis not present

## 2021-06-08 DIAGNOSIS — J9601 Acute respiratory failure with hypoxia: Secondary | ICD-10-CM | POA: Diagnosis not present

## 2021-06-08 DIAGNOSIS — R778 Other specified abnormalities of plasma proteins: Secondary | ICD-10-CM | POA: Diagnosis not present

## 2021-06-08 DIAGNOSIS — J189 Pneumonia, unspecified organism: Secondary | ICD-10-CM | POA: Diagnosis not present

## 2021-06-08 LAB — COMPREHENSIVE METABOLIC PANEL
ALT: 19 U/L (ref 0–44)
AST: 23 U/L (ref 15–41)
Albumin: 2.9 g/dL — ABNORMAL LOW (ref 3.5–5.0)
Alkaline Phosphatase: 56 U/L (ref 38–126)
Anion gap: 8 (ref 5–15)
BUN: 24 mg/dL — ABNORMAL HIGH (ref 8–23)
CO2: 31 mmol/L (ref 22–32)
Calcium: 8 mg/dL — ABNORMAL LOW (ref 8.9–10.3)
Chloride: 106 mmol/L (ref 98–111)
Creatinine, Ser: 0.95 mg/dL (ref 0.44–1.00)
GFR, Estimated: >60 mL/min (ref >60)
Glucose, Bld: 98 mg/dL (ref 70–99)
Potassium: 3.5 mmol/L (ref 3.5–5.1)
Sodium: 145 mmol/L (ref 135–145)
Total Bilirubin: 0.6 mg/dL (ref 0.3–1.2)
Total Protein: 6 g/dL — ABNORMAL LOW (ref 6.5–8.1)

## 2021-06-08 LAB — CBC WITH DIFFERENTIAL/PLATELET
Abs Immature Granulocytes: 0.05 10*3/uL (ref 0.00–0.07)
Basophils Absolute: 0 10*3/uL (ref 0.0–0.1)
Basophils Relative: 0 %
Eosinophils Absolute: 0 10*3/uL (ref 0.0–0.5)
Eosinophils Relative: 0 %
HCT: 41.2 % (ref 36.0–46.0)
Hemoglobin: 13.7 g/dL (ref 12.0–15.0)
Immature Granulocytes: 1 %
Lymphocytes Relative: 30 %
Lymphs Abs: 3 10*3/uL (ref 0.7–4.0)
MCH: 33.7 pg (ref 26.0–34.0)
MCHC: 33.3 g/dL (ref 30.0–36.0)
MCV: 101.2 fL — ABNORMAL HIGH (ref 80.0–100.0)
Monocytes Absolute: 0.8 10*3/uL (ref 0.1–1.0)
Monocytes Relative: 8 %
Neutro Abs: 6.1 10*3/uL (ref 1.7–7.7)
Neutrophils Relative %: 61 %
Platelets: 174 10*3/uL (ref 150–400)
RBC: 4.07 MIL/uL (ref 3.87–5.11)
RDW: 13.2 % (ref 11.5–15.5)
WBC: 10 10*3/uL (ref 4.0–10.5)
nRBC: 0 % (ref 0.0–0.2)

## 2021-06-08 LAB — LEGIONELLA PNEUMOPHILA SEROGP 1 UR AG: L. pneumophila Serogp 1 Ur Ag: NEGATIVE

## 2021-06-08 LAB — MAGNESIUM: Magnesium: 2.4 mg/dL (ref 1.7–2.4)

## 2021-06-08 LAB — GLUCOSE, CAPILLARY: Glucose-Capillary: 90 mg/dL (ref 70–99)

## 2021-06-08 SURGERY — LAPAROSCOPIC CHOLECYSTECTOMY WITH INTRAOPERATIVE CHOLANGIOGRAM
Anesthesia: General

## 2021-06-08 MED ORDER — HEPARIN BOLUS VIA INFUSION
2000.0000 [IU] | Freq: Once | INTRAVENOUS | Status: AC
  Administered 2021-06-08: 18:00:00 2000 [IU] via INTRAVENOUS
  Filled 2021-06-08: qty 2000

## 2021-06-08 MED ORDER — MORPHINE BOLUS VIA INFUSION: 3.0000 mg | Freq: Once | INTRAVENOUS | Status: DC

## 2021-06-08 MED ORDER — TECHNETIUM TC 99M MEBROFENIN IV KIT
5.4000 | PACK | Freq: Once | INTRAVENOUS | Status: AC | PRN
  Administered 2021-06-08: 16:00:00 5.4 via INTRAVENOUS

## 2021-06-08 MED ORDER — HEPARIN (PORCINE) 25000 UT/250ML-% IV SOLN
750.0000 [IU]/h | INTRAVENOUS | Status: DC
  Administered 2021-06-08: 18:00:00 950 [IU]/h via INTRAVENOUS
  Filled 2021-06-08: qty 250

## 2021-06-08 MED ORDER — ROCURONIUM BROMIDE 10 MG/ML (PF) SYRINGE
PREFILLED_SYRINGE | INTRAVENOUS | Status: AC
  Filled 2021-06-08: qty 10

## 2021-06-08 MED ORDER — PROPOFOL 10 MG/ML IV BOLUS
INTRAVENOUS | Status: AC
  Filled 2021-06-08: qty 20

## 2021-06-08 MED ORDER — MORPHINE SULFATE (PF) 4 MG/ML IV SOLN
3.0000 mg | Freq: Once | INTRAVENOUS | Status: AC
  Administered 2021-06-08: 15:00:00 3 mg via INTRAVENOUS
  Filled 2021-06-08: qty 1

## 2021-06-08 MED ORDER — BUPIVACAINE-EPINEPHRINE (PF) 0.25% -1:200000 IJ SOLN
INTRAMUSCULAR | Status: AC
  Filled 2021-06-08: qty 30

## 2021-06-08 MED ORDER — FENTANYL CITRATE (PF) 250 MCG/5ML IJ SOLN
INTRAMUSCULAR | Status: AC
  Filled 2021-06-08: qty 5

## 2021-06-08 MED ORDER — GADOBUTROL 1 MMOL/ML IV SOLN
9.5000 mL | Freq: Once | INTRAVENOUS | Status: AC | PRN
  Administered 2021-06-08: 17:00:00 9.5 mL via INTRAVENOUS

## 2021-06-08 MED ORDER — LIDOCAINE 2% (20 MG/ML) 5 ML SYRINGE
INTRAMUSCULAR | Status: AC
  Filled 2021-06-08: qty 5

## 2021-06-08 NOTE — Progress Notes (Signed)
ANTICOAGULATION CONSULT NOTE - Follow Up Consult  Pharmacy Consult for Heparin Indication: atrial fibrillation  Allergies  Allergen Reactions   Butoconazole Itching, Rash and Other (See Comments)    'Redness and swelling feverish ' 'worse symptoms '   Keflex [Cephalexin] Diarrhea   Lipitor [Atorvastatin Calcium] Other (See Comments)    'Muscle weakness'     Cortisone     This medication causes confusion   Hydrocodone Other (See Comments)    Patient Measurements: Height: 4\' 8"  (142.2 cm) Weight: 95.7 kg (211 lb) (scale c) IBW/kg (Calculated) : 36.3 Heparin Dosing Weight: 62 kg  Vital Signs: Temp: 98.3 F (36.8 C) (12/27 1152) Temp Source: Oral (12/27 1152) BP: 124/73 (12/27 1152) Pulse Rate: 57 (12/27 1152)  Labs: Recent Labs    06/05/21 1503 06/05/21 1703 06/06/21 0427 06/06/21 0427 06/07/21 0733 06/07/21 2021 06/08/21 0436  HGB  --   --  14.8   < > 14.2  --  13.7  HCT  --   --  45.2  --  42.5  --  41.2  PLT  --   --  138*  --  157  --  174  APTT  --   --   --   --  47* 109*  --   HEPARINUNFRC  --   --   --   --  >1.10*  --   --   CREATININE  --   --  1.13*  --  1.05*  --  0.95  TROPONINIHS 56* 56*  --   --   --   --   --    < > = values in this interval not displayed.     Estimated Creatinine Clearance: 49.3 mL/min (by C-G formula based on SCr of 0.95 mg/dL).  Assessment:  75 yr old female on Apixaban 5 mg BID PTA for atrial fibrillation. Apixaban stopped 12/25, with plan for lap chole on 12/27 am. Last Apixaban dose 0922 am.  Transitioned to IV heparin ~12 hours later.  Using aPTTs for heparin monitoring while heparin levels are falsely elevated due to recent Apixaban doses.    aPTT was above goal last night (109 seconds) on 1050 units/hr and infusion rate reduced to 950 units/hr.  Heparin held at 0344 this am for surgery scheduled at 10am. Surgery cancelled, now planning further studies and avoidance of surgery if possible. Heparin drip to resume.  Goal of  Therapy:  Heparin level 0.3-0.7 units/ml aPTT 66-102 seconds Monitor platelets by anticoagulation protocol: Yes   Plan:  Heparin 2000 units IV bolus, then resume drip at 950 units/hr. aPTT ~8 hrs after drip resumes. Daily aPTT and heparin level until correlating. Apixaban on hold. Follow up studies and further plans.  Arty Baumgartner, [place] 06/08/2021,1:21 PM

## 2021-06-08 NOTE — Progress Notes (Signed)
Trauma/Critical Care Follow Up Note  Subjective:    Overnight Issues:   Objective:  Vital signs for last 24 hours: Temp:  [97.7 F (36.5 C)-98.3 F (36.8 C)] 98.3 F (36.8 C) (12/27 1152) Pulse Rate:  [46-65] 57 (12/27 1152) Resp:  [17-20] 19 (12/27 1152) BP: (124-134)/(56-73) 124/73 (12/27 1152) SpO2:  [95 %-99 %] 96 % (12/27 1152) Weight:  [95.7 kg] 95.7 kg (12/27 0544)  Hemodynamic parameters for last 24 hours:    Intake/Output from previous day: 12/26 0701 - 12/27 0700 In: 1469.6 [P.O.:540; I.V.:189.2; IV Piggyback:740.4] Out: 1875 [Urine:1875]  Intake/Output this shift: No intake/output data recorded.  Vent settings for last 24 hours:    Physical Exam:  Gen: comfortable, no distress Neuro: non-focal exam HEENT: PERRL Neck: supple CV: RRR Pulm: mildly labored breathing on 3L  Abd: soft, NT GU: clear yellow urine Extr: wwp, no edema   Results for orders placed or performed during the hospital encounter of 06/05/21 (from the past 24 hour(s))  APTT     Status: Abnormal   Collection Time: 06/07/21  8:21 PM  Result Value Ref Range   aPTT 109 (H) 24 - 36 seconds  Surgical pcr screen     Status: None   Collection Time: 06/07/21  9:11 PM   Specimen: Nasal Mucosa; Nasal Swab  Result Value Ref Range   MRSA, PCR NEGATIVE NEGATIVE   Staphylococcus aureus NEGATIVE NEGATIVE  Comprehensive metabolic panel     Status: Abnormal   Collection Time: 06/08/21  4:36 AM  Result Value Ref Range   Sodium 145 135 - 145 mmol/L   Potassium 3.5 3.5 - 5.1 mmol/L   Chloride 106 98 - 111 mmol/L   CO2 31 22 - 32 mmol/L   Glucose, Bld 98 70 - 99 mg/dL   BUN 24 (H) 8 - 23 mg/dL   Creatinine, Ser 0.95 0.44 - 1.00 mg/dL   Calcium 8.0 (L) 8.9 - 10.3 mg/dL   Total Protein 6.0 (L) 6.5 - 8.1 g/dL   Albumin 2.9 (L) 3.5 - 5.0 g/dL   AST 23 15 - 41 U/L   ALT 19 0 - 44 U/L   Alkaline Phosphatase 56 38 - 126 U/L   Total Bilirubin 0.6 0.3 - 1.2 mg/dL   GFR, Estimated >60 >60 mL/min    Anion gap 8 5 - 15  CBC with Differential/Platelet     Status: Abnormal   Collection Time: 06/08/21  4:36 AM  Result Value Ref Range   WBC 10.0 4.0 - 10.5 K/uL   RBC 4.07 3.87 - 5.11 MIL/uL   Hemoglobin 13.7 12.0 - 15.0 g/dL   HCT 41.2 36.0 - 46.0 %   MCV 101.2 (H) 80.0 - 100.0 fL   MCH 33.7 26.0 - 34.0 pg   MCHC 33.3 30.0 - 36.0 g/dL   RDW 13.2 11.5 - 15.5 %   Platelets 174 150 - 400 K/uL   nRBC 0.0 0.0 - 0.2 %   Neutrophils Relative % 61 %   Neutro Abs 6.1 1.7 - 7.7 K/uL   Lymphocytes Relative 30 %   Lymphs Abs 3.0 0.7 - 4.0 K/uL   Monocytes Relative 8 %   Monocytes Absolute 0.8 0.1 - 1.0 K/uL   Eosinophils Relative 0 %   Eosinophils Absolute 0.0 0.0 - 0.5 K/uL   Basophils Relative 0 %   Basophils Absolute 0.0 0.0 - 0.1 K/uL   Immature Granulocytes 1 %   Abs Immature Granulocytes 0.05 0.00 - 0.07 K/uL  Magnesium     Status: None   Collection Time: 06/08/21  4:36 AM  Result Value Ref Range   Magnesium 2.4 1.7 - 2.4 mg/dL  Glucose, capillary     Status: None   Collection Time: 06/08/21  6:47 AM  Result Value Ref Range   Glucose-Capillary 90 70 - 99 mg/dL    Assessment & Plan:  Present on Admission:  CAP (community acquired pneumonia)  PAF (paroxysmal atrial fibrillation) (HCC)  CAD (coronary artery disease)  Chronic diastolic CHF (congestive heart failure) (HCC)  Acute respiratory failure with hypoxia (HCC)  Elevated troponin  Hypothyroidism  Leukocytosis    LOS: 3 days   Additional comments:I reviewed the patient's new clinical lab test results.   and I reviewed the patients new imaging test results.    16F with morbid obesity, CAD, CHF, AF on DOAC, last dose of Eliquis 12/25 AM presented with pulmonary sx 12/24   -Tbili remains normal - per cards, baseline risk is elevated, but no additional testing.intervention indicated.  - poor pulmonary status with baseline bronchiectasis, worsened by PNA dx this admission. D/w anesthesia and they feel she would be  unlikely to be extubated immediately post-operatively.  - NSQIP calculator (image below) with above average risk in every category, 7% risk of death, 18% risk of complication - recommend MRCP and HIDA scan. If MRCP positive, recommend GI consultation and further discussion of risks as ERCP would require general anesthesia. If MRCP negative and HIDA positive, recommend cholecystostomy tube placement.  - All of this discussed with the patient, her husband, and son at bedside today. All verbalize understanding of significant risks associated with surgery and are agreeable to plan for further imaging and avoidance of surgery if possible.   Jesusita Oka, MD Trauma & General Surgery Please use [email or web address] to contact on call provider  06/08/2021  *Care during the described time interval was provided by me. I have reviewed this patient's available data, including medical history, events of note, physical examination and test results as part of my evaluation.

## 2021-06-08 NOTE — Progress Notes (Signed)
PROGRESS NOTE    MASAYE GATCHALIAN  CVE:[id number] DOB: Sep 10, 1945 DOA: 06/05/2021 PCP: [name], DO   Brief Narrative:  [name] is a 75 y.o. female with medical history significant of HTN, hyperlipidemia, CAD, diastolic CHF, A. fib on chronic anticoagulation, hypothyroidism, and morbid obesity who presented with complaints of cough and shortness of breath for approximately 3 weeks.  Her husband has been coughing as well, and both have tested negative for COVID-19 on 2 separate occasions.  In route with EMS O2 saturations as low as 83% on room air.   Upon admission to the emergency department patient was seen to be afebrile, respirations 19-32, blood pressure elevated to 210/116, and O2 saturations initially maintained on 8 L of nasal cannula oxygen.  Labs significant for WBC 11.4, hemoglobin 15.1 BUN 22, creatinine 1.01, glucose 101, BNP 303.7, and high-sensitivity troponin 22-> 56.  CT scan of the chest noted widespread progression of bronchiectasis since 2017 with bilateral lower lobe nodular and patchy opacity compatible with active infection superimposed on chronic lung disease with retained or aspirated secretions of the right mainstem bronchus.  Patient has been given Lasix 80 mg IV, DuoNeb breathing treatment, and Levaquin.  Assessment & Plan:   Principal Problem:   CAP (community acquired pneumonia) Active Problems:   CAD (coronary artery disease)   Morbid obesity with BMI of 50.0-59.9, adult (HCC)   Chronic diastolic CHF (congestive heart failure) (HCC)   PAF (paroxysmal atrial fibrillation) (HCC)   Acute respiratory failure with hypoxia (HCC)   Elevated troponin   Hypothyroidism   Leukocytosis  Acute respiratory failure with hypoxia secondary pneumonia  bronchiectasis: Leukocytosis 11.4 with some tachypnea but does not meet SIRS criteria and thus does not meet sepsis.  Continues to improve.  Still with coarse breath sounds with rhonchi bilaterally.  We will continue Rocephin  and doxycycline and encouraged incentive spirometry.  She is on room air now.  Chest pain: Troponin slightly elevated but flat.  Echo shows normal ejection fraction with no wall motion abnormality but diastolic dysfunction.  Likely demand ischemia.  Hypertensive urgency: Blood pressures elevated up to 210/116 while in the ED.  Home blood pressure regimen includes losartan 25 mg daily, furosemide 40 mg twice daily, metoprolol 37.5 mg twice daily, and sotalol 80 mg twice daily.   -Continue losartan, sotalol, and metoprolol.  Blood pressure now much better controlled.   Chronic diastolic congestive heart failure:  On physical exam patient without significant lower extremity swelling.  No pulmonary edema or vascular congestion on the chest x-ray.  BNP is slightly elevated but we do not know her baseline.  Appears euvolemic on exam.  Continue dose of oral Lasix.  Echo as mentioned above.  Cholelithiasis with acute cholecystitis: Abdominal ultrasound suspicious for acute cholecystitis.  Her total bilirubin is elevated initially and CBD is dilated.  Bilirubin improved.  She has positive Murphy sign as well.  HIDA scan ordered but still pending due to holiday schedule.  Consulted general surgery, patient was seen by Dr. Bobbye Morton who recommended acute cholecystectomy and cardiology clearance.  Cardiology cleared her for surgery stating that she has slightly elevated risk but no further testing indicated.  Anesthesiology however believes that she has high risk of not being extubated after the surgery.  She is being considered high risk for surgery by general surgery team so they are pursuing MRCP and HIDA scan.  If she were to have cholecystitis based on those findings, they are considering percutaneous cholecystostomy drain.  If  choledocholithiasis, GI will need to be consulted for ERCP.  Hypomagnesemia/hypokalemia: Resolved   Paroxysmal atrial fibrillation on chronic anticoagulation: Patient appears to be in  sinus rhythm at this time.  Continue Lopressor and IV heparin for now, hold Eliquis.   Hypothyroidism: Continue Synthroid.   Hyperlipidemia -Continue Crestor   Morbid obesity: BMI previously 51.07 kg/m: Weight loss counseled.  DVT prophylaxis: heparin bolus via infusion 2,000 Units Start: 06/08/21 1415   Code Status: Full Code  Family Communication: Patient's husband and son at the bedside. Status is: Inpatient  Remains inpatient appropriate because: Needs inpatient medical management for medical issues as mentioned above.  Estimated body mass index is 47.31 kg/m as calculated from the following:   Height as of this encounter: 4\' 8"  (1.422 m).   Weight as of this encounter: 95.7 kg.     Nutritional Assessment: Body mass index is 47.31 kg/m.Marland Kitchen Seen by dietician.  I agree with the assessment and plan as outlined below: Nutrition Status:  Skin Assessment: I have examined the patient's skin and I agree with the wound assessment as performed by the wound care RN as outlined below:    Consultants:  General surgery  Procedures:  None  Antimicrobials:  Anti-infectives (From admission, onward)    Start     Dose/Rate Route Frequency Ordered Stop   06/06/21 1615  metroNIDAZOLE (FLAGYL) IVPB 500 mg        500 mg 100 mL/hr over 60 Minutes Intravenous Every 8 hours 06/06/21 1523     06/06/21 1515  metroNIDAZOLE (FLAGYL) IVPB 500 mg  Status:  Discontinued        500 mg 100 mL/hr over 60 Minutes Intravenous  Once 06/06/21 1423 06/06/21 1523   06/06/21 1000  cefTRIAXone (ROCEPHIN) 2 g in sodium chloride 0.9 % 100 mL IVPB  Status:  Discontinued        2 g 200 mL/hr over 30 Minutes Intravenous Every 24 hours 06/05/21 1345 06/05/21 1349   06/06/21 1000  cefTRIAXone (ROCEPHIN) 2 g in sodium chloride 0.9 % 100 mL IVPB        2 g 200 mL/hr over 30 Minutes Intravenous Every 24 hours 06/05/21 1349 06/10/21 0959   06/06/21 0000  doxycycline (VIBRAMYCIN) 100 mg in sodium chloride 0.9 %  250 mL IVPB        100 mg 125 mL/hr over 120 Minutes Intravenous Every 12 hours 06/05/21 1350 06/09/21 2359   06/05/21 1015  levofloxacin (LEVAQUIN) IVPB 750 mg        750 mg 100 mL/hr over 90 Minutes Intravenous  Once 06/05/21 1002 06/05/21 1222          Subjective:  Patient seen and examined earlier when her son and husband were at the bedside.  Patient was feeling better.  No abdominal pain, chest pain or shortness of breath.  At that time, patient was scheduled to have surgery.  Later on, per surgery was canceled and I was informed that family is very upset since they did not know the reason.  Dr. Vernetta Honey was called and she talked to the patient and the family and explained the reasons.  Objective: Vitals:   06/07/21 2008 06/08/21 0544 06/08/21 0732 06/08/21 1152  BP: 129/65 (!) 125/56 133/62 124/73  Pulse: (!) 46 (!) 59 (!) 56 (!) 57  Resp:  18 20 19   Temp: 97.9 F (36.6 C) 97.8 F (36.6 C) 97.7 F (36.5 C) 98.3 F (36.8 C)  TempSrc: Oral  Oral Oral  SpO2: 96%  98% 99% 96%  Weight:  95.7 kg    Height:        Intake/Output Summary (Last 24 hours) at 06/08/2021 1337 Last data filed at 06/08/2021 0500 Gross per 24 hour  Intake 1229.58 ml  Output 1475 ml  Net -245.42 ml    Filed Weights   06/06/21 0655 06/07/21 0016 06/08/21 0544  Weight: 96.4 kg 96.3 kg 95.7 kg    Examination:  General exam: Appears calm and comfortable, obese Respiratory system: Coarse breath sounds with rhonchi bilaterally. Respiratory effort normal. Cardiovascular system: S1 & S2 heard, RRR. No JVD, murmurs, rubs, gallops or clicks.  +1 pitting edema bilateral lower extremity Gastrointestinal system: Abdomen is nondistended, soft and epigastric and right upper quadrant tenderness. No organomegaly or masses felt. Normal bowel sounds heard. Central nervous system: Alert and oriented. No focal neurological deficits. Extremities: Symmetric 5 x 5 power. Skin: No rashes, lesions or ulcers.   Psychiatry: Judgement and insight appear normal. Mood & affect appropriate.   Data Reviewed: I have personally reviewed following labs and imaging studies  CBC: Recent Labs  Lab 06/05/21 0233 06/06/21 0427 06/07/21 0733 06/08/21 0436  WBC 11.4* 9.4 9.4 10.0  NEUTROABS 8.3*  --  6.1 6.1  HGB 15.1* 14.8 14.2 13.7  HCT 46.8* 45.2 42.5 41.2  MCV 104.9* 101.6* 100.7* 101.2*  PLT 171 138* 157 784    Basic Metabolic Panel: Recent Labs  Lab 06/05/21 0233 06/06/21 0427 06/07/21 0733 06/08/21 0436  NA 138 137 143 145  K 5.1 3.8 2.9* 3.5  CL 101 95* 98 106  CO2 28 29 32 31  GLUCOSE 101* 126* 98 98  BUN 22 21 23  24*  CREATININE 1.01* 1.13* 1.05* 0.95  CALCIUM 9.4 8.8* 8.4* 8.0*  MG  --   --  1.5* 2.4    GFR: Estimated Creatinine Clearance: 49.3 mL/min (by C-G formula based on SCr of 0.95 mg/dL). Liver Function Tests: Recent Labs  Lab 06/05/21 1703 06/06/21 1448 06/07/21 0733 06/08/21 0436  AST 40 46* 29 23  ALT 26 23 23 19   ALKPHOS 68 66 58 56  BILITOT 1.3* 1.0 0.5 0.6  PROT 6.4* 7.1 6.4* 6.0*  ALBUMIN 3.1* 3.2* 3.0* 2.9*    No results for input(s): LIPASE, AMYLASE in the last 168 hours. No results for input(s): AMMONIA in the last 168 hours. Coagulation Profile: No results for input(s): INR, PROTIME in the last 168 hours. Cardiac Enzymes: No results for input(s): CKTOTAL, CKMB, CKMBINDEX, TROPONINI in the last 168 hours. BNP (last 3 results) Recent Labs    03/15/21 1227  PROBNP 822*    HbA1C: No results for input(s): HGBA1C in the last 72 hours. CBG: Recent Labs  Lab 06/08/21 0647  GLUCAP 90   Lipid Profile: No results for input(s): CHOL, HDL, LDLCALC, TRIG, CHOLHDL, LDLDIRECT in the last 72 hours. Thyroid Function Tests: Recent Labs    06/05/21 2058  TSH 0.424    Anemia Panel: No results for input(s): VITAMINB12, FOLATE, FERRITIN, TIBC, IRON, RETICCTPCT in the last 72 hours. Sepsis Labs: Recent Labs  Lab 06/05/21 0703 06/06/21 0427  06/07/21 0733  PROCALCITON <0.10 <0.10 <0.10     Recent Results (from the past 240 hour(s))  Resp Panel by RT-PCR (Flu A&B, Covid) Nasopharyngeal Swab     Status: None   Collection Time: 06/05/21  2:39 AM   Specimen: Nasopharyngeal Swab; Nasopharyngeal(NP) swabs in vial transport medium  Result Value Ref Range Status   SARS Coronavirus 2 by RT PCR  NEGATIVE NEGATIVE Final    Comment: (NOTE) SARS-CoV-2 target nucleic acids are NOT DETECTED.  The SARS-CoV-2 RNA is generally detectable in upper respiratory specimens during the acute phase of infection. The lowest concentration of SARS-CoV-2 viral copies this assay can detect is 138 copies/mL. A negative result does not preclude SARS-Cov-2 infection and should not be used as the sole basis for treatment or other patient management decisions. A negative result may occur with  improper specimen collection/handling, submission of specimen other than nasopharyngeal swab, presence of viral mutation(s) within the areas targeted by this assay, and inadequate number of viral copies(<138 copies/mL). A negative result must be combined with clinical observations, patient history, and epidemiological information. The expected result is Negative.  Fact Sheet for Patients:  EntrepreneurPulse.com.au  Fact Sheet for Healthcare Providers:  IncredibleEmployment.be  This test is no t yet approved or cleared by the [place] FDA and  has been authorized for detection and/or diagnosis of SARS-CoV-2 by FDA under an Emergency Use Authorization (EUA). This EUA will remain  in effect (meaning this test can be used) for the duration of the COVID-19 declaration under Section 564(b)(1) of the Act, 21 U.S.C.section 360bbb-3(b)(1), unless the authorization is terminated  or revoked sooner.       Influenza A by PCR NEGATIVE NEGATIVE Final   Influenza B by PCR NEGATIVE NEGATIVE Final    Comment: (NOTE) The Xpert  Xpress SARS-CoV-2/FLU/RSV plus assay is intended as an aid in the diagnosis of influenza from Nasopharyngeal swab specimens and should not be used as a sole basis for treatment. Nasal washings and aspirates are unacceptable for Xpert Xpress SARS-CoV-2/FLU/RSV testing.  Fact Sheet for Patients: EntrepreneurPulse.com.au  Fact Sheet for Healthcare Providers: IncredibleEmployment.be  This test is not yet approved or cleared by the [place] FDA and has been authorized for detection and/or diagnosis of SARS-CoV-2 by FDA under an Emergency Use Authorization (EUA). This EUA will remain in effect (meaning this test can be used) for the duration of the COVID-19 declaration under Section 564(b)(1) of the Act, 21 U.S.C. section 360bbb-3(b)(1), unless the authorization is terminated or revoked.  Performed at [facility], [place] [address], [place], [place] [address]   Urine Culture     Status: Abnormal   Collection Time: 06/05/21  9:12 AM   Specimen: Urine, Clean Catch  Result Value Ref Range Status   Specimen Description URINE, CLEAN CATCH  Final   Special Requests NONE  Final   Culture (A)  Final    30,000 COLONIES/mL DIPHTHEROIDS(CORYNEBACTERIUM SPECIES) Standardized susceptibility testing for this organism is not available. Performed at [facility], [place] [address], [place], [place] [address]    Report Status 06/06/2021 FINAL  Final  Surgical pcr screen     Status: None   Collection Time: 06/07/21  9:11 PM   Specimen: Nasal Mucosa; Nasal Swab  Result Value Ref Range Status   MRSA, PCR NEGATIVE NEGATIVE Final   Staphylococcus aureus NEGATIVE NEGATIVE Final    Comment: (NOTE) The Xpert SA Assay (FDA approved for NASAL specimens in patients 25 years of age and older), is one component of a comprehensive surveillance program. It is not intended to diagnose infection nor to guide or monitor treatment. Performed at [facility], [place] [address], [place], [place] [address]        Radiology Studies: No results found.  Scheduled Meds:  allopurinol  100 mg Oral BID   famotidine  20 mg Oral QHS  feeding supplement  237 mL Oral BID BM   furosemide  40 mg Oral BID   guaiFENesin  600 mg Oral BID   heparin  2,000 Units Intravenous Once   levothyroxine  88 mcg Oral Daily   losartan  50 mg Oral Daily   metoprolol succinate  37.5 mg Oral BID   predniSONE  40 mg Oral Q breakfast   rosuvastatin  5 mg Oral QODAY   saccharomyces boulardii  250 mg Oral BID   sodium chloride flush  3 mL Intravenous Q12H   sotalol  80 mg Oral BID   Continuous Infusions:  cefTRIAXone (ROCEPHIN)  IV 2 g (06/08/21 0906)   doxycycline (VIBRAMYCIN) IV 100 mg (06/08/21 1207)   heparin     metronidazole 500 mg (06/08/21 0806)     LOS: 3 days   Time spent: 28 minutes   Darliss Cheney, MD [facility]  06/08/2021, 1:37 PM  Please page via [place] and do not message via secure chat for anything urgent. Secure chat can be used for anything non urgent.  How to contact the [facility] Attending or Consulting provider [place] or covering provider during after hours [place], for this patient?  Check the care team in [facility] and look for a) attending/consulting [facility] provider listed and b) the [facility] team listed. Page or secure chat [phone]. Log into [email or web address] and use [facility] universal password to access. If you do not have the password, please contact the hospital operator. Locate the [facility] provider you are looking for under [facility] and page to a number that you can be directly reached. If you still have difficulty reaching the provider, please page the [facility] (Director on Call) for the Hospitalists listed on amion for assistance.

## 2021-06-08 NOTE — Progress Notes (Signed)
Pt going to OR this AM.  Stopping heparin gtt pre-op.

## 2021-06-09 DIAGNOSIS — J471 Bronchiectasis with (acute) exacerbation: Secondary | ICD-10-CM | POA: Diagnosis not present

## 2021-06-09 DIAGNOSIS — J9601 Acute respiratory failure with hypoxia: Secondary | ICD-10-CM | POA: Diagnosis not present

## 2021-06-09 DIAGNOSIS — R410 Disorientation, unspecified: Secondary | ICD-10-CM

## 2021-06-09 DIAGNOSIS — J189 Pneumonia, unspecified organism: Secondary | ICD-10-CM | POA: Diagnosis not present

## 2021-06-09 DIAGNOSIS — I480 Paroxysmal atrial fibrillation: Secondary | ICD-10-CM | POA: Diagnosis not present

## 2021-06-09 LAB — CBC
HCT: 42.9 % (ref 36.0–46.0)
Hemoglobin: 14.2 g/dL (ref 12.0–15.0)
MCH: 33.6 pg (ref 26.0–34.0)
MCHC: 33.1 g/dL (ref 30.0–36.0)
MCV: 101.7 fL — ABNORMAL HIGH (ref 80.0–100.0)
Platelets: 175 10*3/uL (ref 150–400)
RBC: 4.22 MIL/uL (ref 3.87–5.11)
RDW: 13.2 % (ref 11.5–15.5)
WBC: 10.6 10*3/uL — ABNORMAL HIGH (ref 4.0–10.5)
nRBC: 0 % (ref 0.0–0.2)

## 2021-06-09 LAB — COMPREHENSIVE METABOLIC PANEL
ALT: 19 U/L (ref 0–44)
AST: 28 U/L (ref 15–41)
Albumin: 3.2 g/dL — ABNORMAL LOW (ref 3.5–5.0)
Alkaline Phosphatase: 56 U/L (ref 38–126)
Anion gap: 11 (ref 5–15)
BUN: 20 mg/dL (ref 8–23)
CO2: 28 mmol/L (ref 22–32)
Calcium: 7.5 mg/dL — ABNORMAL LOW (ref 8.9–10.3)
Chloride: 102 mmol/L (ref 98–111)
Creatinine, Ser: 0.97 mg/dL (ref 0.44–1.00)
GFR, Estimated: >60 mL/min (ref >60)
Glucose, Bld: 95 mg/dL (ref 70–99)
Potassium: 3.4 mmol/L — ABNORMAL LOW (ref 3.5–5.1)
Sodium: 141 mmol/L (ref 135–145)
Total Bilirubin: 0.7 mg/dL (ref 0.3–1.2)
Total Protein: 6.3 g/dL — ABNORMAL LOW (ref 6.5–8.1)

## 2021-06-09 LAB — APTT
aPTT: 146 seconds — ABNORMAL HIGH (ref 24–36)
aPTT: 88 seconds — ABNORMAL HIGH (ref 24–36)

## 2021-06-09 LAB — HEPARIN LEVEL (UNFRACTIONATED): Heparin Unfractionated: >1.1 IU/mL — ABNORMAL HIGH (ref 0.30–0.70)

## 2021-06-09 MED ORDER — HEPARIN (PORCINE) 25000 UT/250ML-% IV SOLN: 750.0000 [IU]/h | INTRAVENOUS | Status: DC

## 2021-06-09 MED ORDER — QUETIAPINE FUMARATE 25 MG PO TABS
12.5000 mg | ORAL_TABLET | Freq: Every day | ORAL | Status: DC
  Administered 2021-06-09 – 2021-06-10 (×2): 12.5 mg via ORAL
  Filled 2021-06-09 (×2): qty 1

## 2021-06-09 MED ORDER — HALOPERIDOL LACTATE 5 MG/ML IJ SOLN
INTRAMUSCULAR | Status: AC
  Filled 2021-06-09: qty 1

## 2021-06-09 MED ORDER — HALOPERIDOL LACTATE 5 MG/ML IJ SOLN: 2.0000 mg | Freq: Once | INTRAMUSCULAR | Status: DC

## 2021-06-09 MED ORDER — SODIUM CHLORIDE 0.9 % IV SOLN
100.0000 mg | Freq: Two times a day (BID) | INTRAVENOUS | Status: AC
  Administered 2021-06-09: 100 mg via INTRAVENOUS
  Filled 2021-06-09: qty 100

## 2021-06-09 MED ORDER — ACETAMINOPHEN 500 MG PO TABS: 500.0000 mg | ORAL_TABLET | Freq: Four times a day (QID) | ORAL | Status: DC | PRN

## 2021-06-09 MED ORDER — PREDNISONE 5 MG PO TABS
5.0000 mg | ORAL_TABLET | Freq: Every day | ORAL | Status: DC
  Administered 2021-06-10 – 2021-06-11 (×2): 5 mg via ORAL
  Filled 2021-06-09 (×2): qty 1

## 2021-06-09 MED ORDER — APIXABAN 5 MG PO TABS
5.0000 mg | ORAL_TABLET | Freq: Two times a day (BID) | ORAL | Status: DC
  Administered 2021-06-10 – 2021-06-11 (×3): 5 mg via ORAL
  Filled 2021-06-09 (×3): qty 1

## 2021-06-09 MED ORDER — HALOPERIDOL LACTATE 5 MG/ML IJ SOLN: 2.0000 mg | Freq: Four times a day (QID) | INTRAMUSCULAR | Status: DC | PRN

## 2021-06-09 MED ORDER — DOXYCYCLINE HYCLATE 100 MG PO TABS
100.0000 mg | ORAL_TABLET | Freq: Two times a day (BID) | ORAL | Status: DC
  Administered 2021-06-10 – 2021-06-11 (×3): 100 mg via ORAL
  Filled 2021-06-09 (×3): qty 1

## 2021-06-09 NOTE — Progress Notes (Addendum)
ANTICOAGULATION CONSULT NOTE - Follow Up Consult  Pharmacy Consult for Heparin Indication: atrial fibrillation  Allergies  Allergen Reactions   Butoconazole Itching, Rash and Other (See Comments)    'Redness and swelling feverish ' 'worse symptoms '   Keflex [Cephalexin] Diarrhea   Lipitor [Atorvastatin Calcium] Other (See Comments)    'Muscle weakness'     Cortisone     This medication causes confusion   Hydrocodone Other (See Comments)    Patient Measurements: Height: 4\' 8"  (142.2 cm) Weight: 95.4 kg (210 lb 4.8 oz) IBW/kg (Calculated) : 36.3 Heparin Dosing Weight: 62 kg  Vital Signs: Temp: 98.4 F (36.9 C) (12/28 0753) Temp Source: Oral (12/28 0753) BP: 181/68 (12/28 0753) Pulse Rate: 63 (12/28 0753)  Labs: Recent Labs    06/07/21 0733 06/07/21 2021 06/08/21 0436 06/09/21 0212  HGB 14.2  --  13.7 14.2  HCT 42.5  --  41.2 42.9  PLT 157  --  174 175  APTT 47* 109*  --  146*  HEPARINUNFRC >1.10*  --   --  >1.10*  CREATININE 1.05*  --  0.95 0.97     Estimated Creatinine Clearance: 48.1 mL/min (by C-G formula based on SCr of 0.97 mg/dL).  Assessment:  75 yr old female on Apixaban 5 mg BID PTA for atrial fibrillation. Apixaban stopped 12/25, with plan for lap chole on 12/27 am. Last Apixaban dose 0922 am.  Transitioned to IV heparin ~12 hours later.  Using aPTTs for heparin monitoring while heparin levels are falsely elevated due to recent Apixaban doses.  aPTT is now at goal (88 seconds) on 750 units/hr. CBC stable overnight, no bleeding issues noted.  Goal of Therapy:  Heparin level 0.3-0.7 units/ml aPTT 66-102 seconds Monitor platelets by anticoagulation protocol: Yes   Plan:  Continue heparin at 750 units/hr Re-check aPTT and heparin level in am  Erin Hearing PharmD., BCPS Clinical Pharmacist 06/09/2021 2:25 PM  Addendum: new orders to transition back to apixaban.   Plan: Apixaban 5mg  bid  06/09/2021 2:38 PM

## 2021-06-09 NOTE — Care Management Important Message (Signed)
Important Message  Patient Details  Name: [name] MRN: [id number] Date of Birth: 1946/05/05   Medicare Important Message Given:  Yes     Shelda Altes 06/09/2021, 10:47 AM

## 2021-06-09 NOTE — Final Consult Note (Signed)
Consultant Final Sign-Off Note    Assessment/Final recommendations  [name] is a 75 y.o. female followed by me for possible Cholelithiasis with possible Acute Cholecystitis   HIDA negative for Acute Cholecystitis. No indication for intervention at this time.  MRCP negative for Choledocholithiasis  LFT's wnl HIDA did show possible Chronic Cholecystitis. The abx she is on for her PNA should cover for this.   Wound care (if applicable):    Diet at discharge: per primary team. Could benefit from a trial of a low fat diet    Activity at discharge: per primary team   Follow-up appointment:  CCS PRN.  Patient is at elevated risk from a cardiac and pulm standpoint per notes which is a factor to consider if she was ever to wish to f/u to discuss possible elective cholecystectomy. See Dr. Craig Guess note from 12/27 for more details   Pending results:  Unresulted Labs (From admission, onward)     Start     Ordered   06/10/21 0500  APTT  Daily,   R     Question:  Specimen collection method  Answer:  Lab=Lab collect   06/08/21 1750   06/10/21 0500  Heparin level (unfractionated)  Daily,   R     Question:  Specimen collection method  Answer:  Lab=Lab collect   06/08/21 1750   06/09/21 1300  Heparin level (unfractionated)  Once-Timed,   TIMED       Question:  Specimen collection method  Answer:  Lab=Lab collect   06/09/21 0411   06/09/21 1200  APTT  Once-Timed,   TIMED       Question:  Specimen collection method  Answer:  Lab=Lab collect   06/09/21 0805   06/09/21 0500  CBC  Daily,   R     Question:  Specimen collection method  Answer:  Lab=Lab collect   06/08/21 1327   06/05/21 1345  Expectorated Sputum Assessment w Gram Stain, Rflx to Resp Cult  Once,   R        06/05/21 1345             Medication recommendations:   Other recommendations:     Thank you for allowing [place] to participate in the care of your patient!  Please consult [place] again if you have further needs for your  patient.  Barth Kirks [facility] 06/09/2021 9:12 AM    Subjective   Patient reports she is tolerating a diet without n/v. No abdominal pain presently. Complains of a productive cough.   Objective  Vital signs in last 24 hours: Temp:  [97.9 F (36.6 C)-98.5 F (36.9 C)] 98.4 F (36.9 C) (12/28 0753) Pulse Rate:  [57-71] 63 (12/28 0753) Resp:  [17-23] 20 (12/28 0753) BP: (124-181)/(68-105) 181/68 (12/28 0753) SpO2:  [93 %-98 %] 98 % (12/28 0753) Weight:  [95.4 kg] 95.4 kg (12/28 0430)  Physical Exam: Gen:  Alert, NAD, pleasant Pulm: Normal rate and effort Abd: Very soft, ND, very mild subjective tenderness of the right upper abdomen, no peritonitis, +BS Psych: A&Ox3  Skin: no rashes noted, warm and dry   Pertinent labs and Studies: Recent Labs    06/07/21 0733 06/08/21 0436 06/09/21 0212  WBC 9.4 10.0 10.6*  HGB 14.2 13.7 14.2  HCT 42.5 41.2 42.9   BMET Recent Labs    06/08/21 0436 06/09/21 0212  NA 145 141  K 3.5 3.4*  CL 106 102  CO2 31 28  GLUCOSE 98 95  BUN 24* 20  CREATININE 0.95 0.97  CALCIUM 8.0* 7.5*   No results for input(s): LABURIN in the last 72 hours. Results for orders placed or performed during the hospital encounter of 06/05/21  Resp Panel by RT-PCR (Flu A&B, Covid) Nasopharyngeal Swab     Status: None   Collection Time: 06/05/21  2:39 AM   Specimen: Nasopharyngeal Swab; Nasopharyngeal(NP) swabs in vial transport medium  Result Value Ref Range Status   SARS Coronavirus 2 by RT PCR NEGATIVE NEGATIVE Final    Comment: (NOTE) SARS-CoV-2 target nucleic acids are NOT DETECTED.  The SARS-CoV-2 RNA is generally detectable in upper respiratory specimens during the acute phase of infection. The lowest concentration of SARS-CoV-2 viral copies this assay can detect is 138 copies/mL. A negative result does not preclude SARS-Cov-2 infection and should not be used as the sole basis for treatment or other patient management decisions. A negative result  may occur with  improper specimen collection/handling, submission of specimen other than nasopharyngeal swab, presence of viral mutation(s) within the areas targeted by this assay, and inadequate number of viral copies(<138 copies/mL). A negative result must be combined with clinical observations, patient history, and epidemiological information. The expected result is Negative.  Fact Sheet for Patients:  EntrepreneurPulse.com.au  Fact Sheet for Healthcare Providers:  IncredibleEmployment.be  This test is no t yet approved or cleared by the [place] FDA and  has been authorized for detection and/or diagnosis of SARS-CoV-2 by FDA under an Emergency Use Authorization (EUA). This EUA will remain  in effect (meaning this test can be used) for the duration of the COVID-19 declaration under Section 564(b)(1) of the Act, 21 U.S.C.section 360bbb-3(b)(1), unless the authorization is terminated  or revoked sooner.       Influenza A by PCR NEGATIVE NEGATIVE Final   Influenza B by PCR NEGATIVE NEGATIVE Final    Comment: (NOTE) The Xpert Xpress SARS-CoV-2/FLU/RSV plus assay is intended as an aid in the diagnosis of influenza from Nasopharyngeal swab specimens and should not be used as a sole basis for treatment. Nasal washings and aspirates are unacceptable for Xpert Xpress SARS-CoV-2/FLU/RSV testing.  Fact Sheet for Patients: EntrepreneurPulse.com.au  Fact Sheet for Healthcare Providers: IncredibleEmployment.be  This test is not yet approved or cleared by the [place] FDA and has been authorized for detection and/or diagnosis of SARS-CoV-2 by FDA under an Emergency Use Authorization (EUA). This EUA will remain in effect (meaning this test can be used) for the duration of the COVID-19 declaration under Section 564(b)(1) of the Act, 21 U.S.C. section 360bbb-3(b)(1), unless the authorization is terminated  or revoked.  Performed at [facility], [place] [address] [place], [place] [address]   Urine Culture     Status: Abnormal   Collection Time: 06/05/21  9:12 AM   Specimen: Urine, Clean Catch  Result Value Ref Range Status   Specimen Description URINE, CLEAN CATCH  Final   Special Requests NONE  Final   Culture (A)  Final    30,000 COLONIES/mL DIPHTHEROIDS(CORYNEBACTERIUM SPECIES) Standardized susceptibility testing for this organism is not available. Performed at [facility], [place] [address], [place], [place] [address]    Report Status 06/06/2021 FINAL  Final  Surgical pcr screen     Status: None   Collection Time: 06/07/21  9:11 PM   Specimen: Nasal Mucosa; Nasal Swab  Result Value Ref Range Status   MRSA, PCR NEGATIVE NEGATIVE Final   Staphylococcus aureus NEGATIVE NEGATIVE Final    Comment: (NOTE) The Xpert SA  Assay (FDA approved for NASAL specimens in patients 39 years of age and older), is one component of a comprehensive surveillance program. It is not intended to diagnose infection nor to guide or monitor treatment. Performed at [facility], [place] [address] [place], [place] [address]     Imaging: NM Hepatobiliary Liver Func  Result Date: 06/08/2021 CLINICAL DATA:  Evaluate for cholecystitis.  Gallstones. EXAM: NUCLEAR MEDICINE HEPATOBILIARY IMAGING TECHNIQUE: Sequential images of the abdomen were obtained out to 60 minutes following intravenous administration of radiopharmaceutical. RADIOPHARMACEUTICALS:  5.4 mCi Tc-29m  Choletec IV COMPARISON:  Abdominal sonogram 06/05/2021 . FINDINGS: Prompt uptake and biliary excretion of activity by the liver is seen. Biliary activity passes into small bowel, consistent with patent common bile duct. Within the first hour no gallbladder activity was visualized. Patient subsequently received 3 mg morphine sulfate via IV bolus. Imaging was performed for an additional 30 minutes. Gallbladder activity was promptly  visualized after morphine administration. IMPRESSION: 1. Patent cystic duct without evidence for acute cholecystitis. 2. Delayed filling of the gallbladder, which may be seen in the setting of chronic cholecystitis. Electronically Signed   By: Kerby Moors M.D.   On: 06/08/2021 15:59   MR ABDOMEN MRCP W WO CONTAST  Result Date: 06/08/2021 CLINICAL DATA:  Concern for cholecystitis and choledocholithiasis. EXAM: MRI ABDOMEN WITHOUT AND WITH CONTRAST (INCLUDING MRCP) TECHNIQUE: Multiplanar multisequence MR imaging of the abdomen was performed both before and after the administration of intravenous contrast. Heavily T2-weighted images of the biliary and pancreatic ducts were obtained, and three-dimensional MRCP images were rendered by post processing. CONTRAST:  9.63mL GADAVIST GADOBUTROL 1 MMOL/ML IV SOLN COMPARISON:  Ultrasound June 05, 2021 and nuclear medicine HIDA scan June 08, 2021 CT abdomen May 25, 2016 FINDINGS: Lower chest: Heterogeneous signal in the lung bases favor atelectasis. Hepatobiliary: No suspicious hepatic lesion. Mild hepatic steatosis. Mild wall thickening of a distended gallbladder with a gallstone measuring 3.5 cm without other findings of acute cholecystitis. Mild for age prominence of the biliary tree with the common duct measuring 8 mm, with gentle tapering of the duct to the pancreatic parenchyma to the level of the ampulla and no intraluminal filling defect identified. Pancreas: Intrinsic T1 signal of the pancreatic parenchyma is within normal limits. No pancreatic ductal dilation or evidence of acute inflammation. No cystic or solid hyperenhancing pancreatic lesion identified. Spleen:  Within normal limits. Adrenals/Urinary Tract: No hydronephrosis. No suspicious renal mass. Tiny bilateral renal cysts. Bilateral adrenal glands are unremarkable. Stomach/Bowel: Visualized portions within the abdomen are unremarkable. Vascular/Lymphatic: No abdominal aortic aneurysm. No  pathologically enlarged abdominal lymph nodes. Other:  No abdominal ascites. Musculoskeletal: Prior lower thoracic vertebral augmentation. No suspicious osseous lesion identified. IMPRESSION: 1. Cholelithiasis with mild wall thickening of a distended gallbladder, which given findings on same day nuclear medicine HIDA scan likely reflect changes of chronic cholecystitis. 2. Mild for age prominence of the biliary tree with gentle tapering of the duct to the level of the ampulla and no choledocholithiasis identified. 3. Mild hepatic steatosis. Electronically Signed   By: Dahlia Bailiff M.D.   On: 06/08/2021 18:56

## 2021-06-09 NOTE — Progress Notes (Signed)
ANTICOAGULATION CONSULT NOTE - Follow Up Consult  Pharmacy Consult for Heparin Indication: atrial fibrillation  Allergies  Allergen Reactions   Butoconazole Itching, Rash and Other (See Comments)    'Redness and swelling feverish ' 'worse symptoms '   Keflex [Cephalexin] Diarrhea   Lipitor [Atorvastatin Calcium] Other (See Comments)    'Muscle weakness'     Cortisone     This medication causes confusion   Hydrocodone Other (See Comments)    Patient Measurements: Height: 4\' 8"  (142.2 cm) Weight: 95.7 kg (211 lb) (scale c) IBW/kg (Calculated) : 36.3 Heparin Dosing Weight: 62 kg  Vital Signs: Temp: 98.5 F (36.9 C) (12/27 2050) Temp Source: Oral (12/27 2050) BP: 143/105 (12/27 2050) Pulse Rate: 57 (12/27 2050)  Labs: Recent Labs    06/07/21 0733 06/07/21 2021 06/08/21 0436 06/09/21 0212  HGB 14.2  --  13.7 14.2  HCT 42.5  --  41.2 42.9  PLT 157  --  174 175  APTT 47* 109*  --  146*  HEPARINUNFRC >1.10*  --   --  >1.10*  CREATININE 1.05*  --  0.95 0.97     Estimated Creatinine Clearance: 48.3 mL/min (by C-G formula based on SCr of 0.97 mg/dL).  Assessment:  75 yr old female on Apixaban 5 mg BID PTA for atrial fibrillation. Apixaban stopped 12/25, with plan for lap chole on 12/27 am. Last Apixaban dose 0922 am.  Transitioned to IV heparin ~12 hours later.  Using aPTTs for heparin monitoring while heparin levels are falsely elevated due to recent Apixaban doses.    aPTT was above goal last night (109 seconds) on 1050 units/hr and infusion rate reduced to 950 units/hr.  Heparin held at 0344 this am for surgery scheduled at 10am. Surgery cancelled, now planning further studies and avoidance of surgery if possible. Heparin drip to resume.  12/28 AM update:  aPTT elevated  Goal of Therapy:  Heparin level 0.3-0.7 units/ml aPTT 66-102 seconds Monitor platelets by anticoagulation protocol: Yes   Plan:  Dec heparin to 750 units/hr Re-check aPTT and heparin level in 8  hours  Narda Bonds, PharmD, [place] Pharmacist Phone: [phone]

## 2021-06-09 NOTE — Progress Notes (Signed)
[name]  LYY:[id number] DOB: [birth year] DOA: 06/05/2021 PCP: [name], DO    Brief Narrative:  75 year old with a history of HTN, HLD, CAD, diastolic CHF, chronic atrial fibrillation on anticoagulation, hypothyroidism, and morbid obesity who presented to the ED with complaints of cough and shortness of breath x3 weeks.  She was found to have oxygen saturations of 83% on room air.  In the ED CT chest noted widespread progression of bronchiectasis with bilateral lower lobe nodular and patchy opacities suggestive of active infection.  Consultants:  General Surgery  Code Status: FULL CODE  Antimicrobials:  Doxycycline 12/24 > Ceftriaxone 12/25 > Flagyl 12/25 >  DVT prophylaxis: IV heparin  Subjective: Patient has acutely become severely confused today and quite agitated.  She is threatening to harm her husband and is being verbally abusive to him.  She is threatening to leave the hospital.  She refuses to believe that she is in the hospital and cannot tell me why she was admitted.  She is alert and conversant but not oriented to place or situation.  She does not appear to be an extremis otherwise.  Assessment & Plan:  Community-acquired pneumonia - bronchiectasis Appears to primarily be an acute flare of bronchiectasis -changed to doxycycline only with plan to complete 10 days -no significant wheezing today  Acute hypoxic respiratory failure Resolved with patient on room air  Cholelithiasis with acute cholecystitis Abdominal US suggestive of acute cholecystitis -positive Murphy sign on exam -General surgery suggested cholecystectomy and cardiology cleared her but anesthesia deemed her too high risk from a respiratory standpoint -HIDA negative for acute cholecystitis but suggestive of chronic cholecystitis - MRCP negative for choledocholithiasis - LFTs now within normal limits -no indication for acute intervention presently and if patient develops severe symptoms percutaneous  intervention would be appropriate  Paroxysmal atrial fibrillation on chronic anticoagulation Eliquis has been on hold for possible surgery/procedure -resume Eliquis today  Acute delirium Likely simple sundowning -check B12 folate and ammonia in a.m. -Haldol as needed as patient is very agitated and at risk of harming herself accidentally -scheduled nightly Seroquel -delirium minimizing precautions  Chest pain Troponin mildly elevated but not in a peaking pattern -felt to be representative of demand ischemia -normal EF on TTE with no WMA  Hypertensive urgency Blood pressure as high as 210/116 during this hospital stay -continue usual home blood pressure medications  Macrocytosis Check folic acid and L27 levels  Chronic diastolic CHF No evidence of volume overload on physical exam  Hypokalemia Supplement and follow -likely simply due to poor intake  Hypomagnesemia Corrected with supplementation  HLD Continue usual Crestor dose  Morbid obesity - Body mass index is 47.15 kg/m.   Family Communication: Spoke with husband at bedside Status is: Inpatient  Objective: Blood pressure (!) 141/58, pulse (!) 58, temperature 98.4 F (36.9 C), temperature source Oral, resp. rate 16, height 4\' 8"  (1.422 m), weight 95.4 kg, SpO2 94 %.  Intake/Output Summary (Last 24 hours) at 06/09/2021 1424 Last data filed at 06/09/2021 1341 Gross per 24 hour  Intake 1296.86 ml  Output 1300 ml  Net -3.14 ml   Filed Weights   06/07/21 0016 06/08/21 0544 06/09/21 0430  Weight: 96.3 kg 95.7 kg 95.4 kg    Examination: General: No acute respiratory distress -very agitated and combative Lungs: Clear to auscultation bilaterally without wheezes or crackles Cardiovascular: Regular rate without murmur gallop or rub normal S1 and S2 Abdomen: Nontender, nondistended, soft, bowel sounds positive, no rebound, no ascites, no  appreciable mass Extremities: No significant cyanosis, clubbing, or edema bilateral  lower extremities  CBC: Recent Labs  Lab 06/05/21 0233 06/06/21 0427 06/07/21 0733 06/08/21 0436 06/09/21 0212  WBC 11.4*   < > 9.4 10.0 10.6*  NEUTROABS 8.3*  --  6.1 6.1  --   HGB 15.1*   < > 14.2 13.7 14.2  HCT 46.8*   < > 42.5 41.2 42.9  MCV 104.9*   < > 100.7* 101.2* 101.7*  PLT 171   < > 157 174 175   < > = values in this interval not displayed.   Basic Metabolic Panel: Recent Labs  Lab 06/07/21 0733 06/08/21 0436 06/09/21 0212  NA 143 145 141  K 2.9* 3.5 3.4*  CL 98 106 102  CO2 32 31 28  GLUCOSE 98 98 95  BUN 23 24* 20  CREATININE 1.05* 0.95 0.97  CALCIUM 8.4* 8.0* 7.5*  MG 1.5* 2.4  --    GFR: Estimated Creatinine Clearance: 48.1 mL/min (by C-G formula based on SCr of 0.97 mg/dL).  Liver Function Tests: Recent Labs  Lab 06/06/21 1448 06/07/21 0733 06/08/21 0436 06/09/21 0212  AST 46* 29 23 28   ALT 23 23 19 19   ALKPHOS 66 58 56 56  BILITOT 1.0 0.5 0.6 0.7  PROT 7.1 6.4* 6.0* 6.3*  ALBUMIN 3.2* 3.0* 2.9* 3.2*     CBG: Recent Labs  Lab 06/08/21 0647  GLUCAP 90    Recent Results (from the past 240 hour(s))  Resp Panel by RT-PCR (Flu A&B, Covid) Nasopharyngeal Swab     Status: None   Collection Time: 06/05/21  2:39 AM   Specimen: Nasopharyngeal Swab; Nasopharyngeal(NP) swabs in vial transport medium  Result Value Ref Range Status   SARS Coronavirus 2 by RT PCR NEGATIVE NEGATIVE Final    Comment: (NOTE) SARS-CoV-2 target nucleic acids are NOT DETECTED.  The SARS-CoV-2 RNA is generally detectable in upper respiratory specimens during the acute phase of infection. The lowest concentration of SARS-CoV-2 viral copies this assay can detect is 138 copies/mL. A negative result does not preclude SARS-Cov-2 infection and should not be used as the sole basis for treatment or other patient management decisions. A negative result may occur with  improper specimen collection/handling, submission of specimen other than nasopharyngeal swab, presence of  viral mutation(s) within the areas targeted by this assay, and inadequate number of viral copies(<138 copies/mL). A negative result must be combined with clinical observations, patient history, and epidemiological information. The expected result is Negative.  Fact Sheet for Patients:  EntrepreneurPulse.com.au  Fact Sheet for Healthcare Providers:  IncredibleEmployment.be  This test is no t yet approved or cleared by the [place] FDA and  has been authorized for detection and/or diagnosis of SARS-CoV-2 by FDA under an Emergency Use Authorization (EUA). This EUA will remain  in effect (meaning this test can be used) for the duration of the COVID-19 declaration under Section 564(b)(1) of the Act, 21 U.S.C.section 360bbb-3(b)(1), unless the authorization is terminated  or revoked sooner.       Influenza A by PCR NEGATIVE NEGATIVE Final   Influenza B by PCR NEGATIVE NEGATIVE Final    Comment: (NOTE) The Xpert Xpress SARS-CoV-2/FLU/RSV plus assay is intended as an aid in the diagnosis of influenza from Nasopharyngeal swab specimens and should not be used as a sole basis for treatment. Nasal washings and aspirates are unacceptable for Xpert Xpress SARS-CoV-2/FLU/RSV testing.  Fact Sheet for Patients: EntrepreneurPulse.com.au  Fact Sheet for Healthcare Providers: IncredibleEmployment.be  This test is not yet approved or cleared by the [place] and has been authorized for detection and/or diagnosis of SARS-CoV-2 by FDA under an Emergency Use Authorization (EUA). This EUA will remain in effect (meaning this test can be used) for the duration of the COVID-19 declaration under Section 564(b)(1) of the Act, 21 U.S.C. section 360bbb-3(b)(1), unless the authorization is terminated or revoked.  Performed at [facility], [place] [address], [place], [place] [address]   Urine Culture     Status:  Abnormal   Collection Time: 06/05/21  9:12 AM   Specimen: Urine, Clean Catch  Result Value Ref Range Status   Specimen Description URINE, CLEAN CATCH  Final   Special Requests NONE  Final   Culture (A)  Final    30,000 COLONIES/mL DIPHTHEROIDS(CORYNEBACTERIUM SPECIES) Standardized susceptibility testing for this organism is not available. Performed at [facility], [place] [address], [place], [place] [address]    Report Status 06/06/2021 FINAL  Final  Surgical pcr screen     Status: None   Collection Time: 06/07/21  9:11 PM   Specimen: Nasal Mucosa; Nasal Swab  Result Value Ref Range Status   MRSA, PCR NEGATIVE NEGATIVE Final   Staphylococcus aureus NEGATIVE NEGATIVE Final    Comment: (NOTE) The Xpert SA Assay (FDA approved for NASAL specimens in patients 47 years of age and older), is one component of a comprehensive surveillance program. It is not intended to diagnose infection nor to guide or monitor treatment. Performed at [facility], [place] [address], [place], [place] [address]      Scheduled Meds:  allopurinol  100 mg Oral BID   famotidine  20 mg Oral QHS   feeding supplement  237 mL Oral BID BM   furosemide  40 mg Oral BID   guaiFENesin  600 mg Oral BID   levothyroxine  88 mcg Oral Daily   losartan  50 mg Oral Daily   metoprolol succinate  37.5 mg Oral BID   predniSONE  40 mg Oral Q breakfast   rosuvastatin  5 mg Oral QODAY   saccharomyces boulardii  250 mg Oral BID   sodium chloride flush  3 mL Intravenous Q12H   sotalol  80 mg Oral BID   Continuous Infusions:  doxycycline (VIBRAMYCIN) IV 100 mg (06/09/21 1305)   heparin 750 Units/hr (06/09/21 0428)   metronidazole 500 mg (06/09/21 0903)     LOS: 4 days   Cherene Altes, MD [facility] Office  [phone] Pager - Text Page per Shea Evans  If 7PM-7AM, please contact night-coverage per Amion 06/09/2021, 2:24 PM

## 2021-06-10 LAB — RETICULOCYTES
Immature Retic Fract: 11.4 % (ref 2.3–15.9)
RBC.: 4.27 MIL/uL (ref 3.87–5.11)
Retic Count, Absolute: 77.3 10*3/uL (ref 19.0–186.0)
Retic Ct Pct: 1.8 % (ref 0.4–3.1)

## 2021-06-10 LAB — CBC
HCT: 43.4 % (ref 36.0–46.0)
Hemoglobin: 14.3 g/dL (ref 12.0–15.0)
MCH: 33.4 pg (ref 26.0–34.0)
MCHC: 32.9 g/dL (ref 30.0–36.0)
MCV: 101.4 fL — ABNORMAL HIGH (ref 80.0–100.0)
Platelets: 191 10*3/uL (ref 150–400)
RBC: 4.28 MIL/uL (ref 3.87–5.11)
RDW: 13.2 % (ref 11.5–15.5)
WBC: 8.8 10*3/uL (ref 4.0–10.5)
nRBC: 0 % (ref 0.0–0.2)

## 2021-06-10 LAB — COMPREHENSIVE METABOLIC PANEL
ALT: 18 U/L (ref 0–44)
AST: 27 U/L (ref 15–41)
Albumin: 3 g/dL — ABNORMAL LOW (ref 3.5–5.0)
Alkaline Phosphatase: 53 U/L (ref 38–126)
Anion gap: 8 (ref 5–15)
BUN: 19 mg/dL (ref 8–23)
CO2: 28 mmol/L (ref 22–32)
Calcium: 7.6 mg/dL — ABNORMAL LOW (ref 8.9–10.3)
Chloride: 105 mmol/L (ref 98–111)
Creatinine, Ser: 0.85 mg/dL (ref 0.44–1.00)
GFR, Estimated: >60 mL/min (ref >60)
Glucose, Bld: 84 mg/dL (ref 70–99)
Potassium: 2.8 mmol/L — ABNORMAL LOW (ref 3.5–5.1)
Sodium: 141 mmol/L (ref 135–145)
Total Bilirubin: 0.7 mg/dL (ref 0.3–1.2)
Total Protein: 5.8 g/dL — ABNORMAL LOW (ref 6.5–8.1)

## 2021-06-10 LAB — FOLATE: Folate: 45.9 ng/mL (ref >5.9)

## 2021-06-10 LAB — IRON AND TIBC
Iron: 149 ug/dL (ref 28–170)
Saturation Ratios: 65 % — ABNORMAL HIGH (ref 10.4–31.8)
TIBC: 230 ug/dL — ABNORMAL LOW (ref 250–450)
UIBC: 81 ug/dL

## 2021-06-10 LAB — RPR: RPR Ser Ql: NONREACTIVE

## 2021-06-10 LAB — AMMONIA: Ammonia: 34 umol/L (ref 9–35)

## 2021-06-10 LAB — VITAMIN B12: Vitamin B-12: 2223 pg/mL — ABNORMAL HIGH (ref 180–914)

## 2021-06-10 LAB — FERRITIN: Ferritin: 255 ng/mL (ref 11–307)

## 2021-06-10 MED ORDER — POTASSIUM CHLORIDE 10 MEQ/100ML IV SOLN
10.0000 meq | INTRAVENOUS | Status: AC
  Administered 2021-06-10 – 2021-06-11 (×2): 10 meq via INTRAVENOUS
  Filled 2021-06-10 (×4): qty 100

## 2021-06-10 NOTE — Evaluation (Addendum)
Physical Therapy Evaluation Patient Details Name: [name] MRN: [id number] DOB: 1945/12/12 Today's Date: 06/10/2021  History of Present Illness  Pt is a 74 y/o female who presented 12/24 with SOB/coughing for 3 weeks. CT of chest showed widespread progression of bronchiectasis with bilateral lower lobe nodular and patchy opacities suggestive of PNA. Has non-surgical dx of cholelithasis.  PMH: HTN, HLD, CAD, diastolic CHF, chronic atrial fibrillation on anticoagulation, hypothyroidism, and morbid obesity  Clinical Impression  Pt was seen for evaluation with reasonable expectations established for follow up with pt going home with husband.  Her previous level of function was to walk independently on rollator, but is having some new dizziness and oscillating movement in standing that is linked with falling while also not on walker.  Request pt to have vestibular PT see next time to weed out the possibility of a treatable inner ear issue.  Follow acutely for progressing her to more mobility, monitor for dizziness and continue to ck on vitals although BP, sats, pulse were not the source of her symptoms.  Will also recommend trying rollator with her to ensure this is safe and secure.      Recommendations for follow up therapy are one component of a multi-disciplinary discharge planning process, led by the attending physician.  Recommendations may be updated based on patient status, additional functional criteria and insurance authorization.  Follow Up Recommendations Home health PT    Assistance Recommended at Discharge Frequent or constant Supervision/Assistance  Functional Status Assessment Patient has had a recent decline in their functional status and demonstrates the ability to make significant improvements in function in a reasonable and predictable amount of time.  Equipment Recommendations  None recommended by PT    Recommendations for Other Services       Precautions / Restrictions  Precautions Precautions: Fall;Other (comment) Precaution Comments: dizzy, no nystagmus but may benefit from vestibular ck Restrictions Weight Bearing Restrictions: No      Mobility  Bed Mobility Overal bed mobility: Needs Assistance Bed Mobility: Supine to Sit;Sit to Supine Rolling: Supervision   Supine to sit: Min guard Sit to supine: Min assist Sit to sidelying: Min assist General bed mobility comments: cued sequence and safety every time she moved    Transfers Overall transfer level: Needs assistance Equipment used: Rolling walker (2 wheels) Transfers: Sit to/from Stand Sit to Stand: Min guard                Ambulation/Gait Ambulation/Gait assistance: Min guard Gait Distance (Feet): 3 Feet Assistive device: Rolling walker (2 wheels);1 person hand held assist Gait Pattern/deviations: Step-to pattern;Wide base of support;Trunk flexed Gait velocity: reduced   Pre-gait activities: standing balance cues and reviewed hand placement General Gait Details: sidesteps on side of bed, limited by her dizzy complaints but BP and sats, pulse were [facility]  Stairs            Wheelchair Mobility    Modified Rankin (Stroke Patients Only)       Balance Overall balance assessment: Needs assistance Sitting-balance support: Feet supported Sitting balance-Leahy Scale: Fair     Standing balance support: Bilateral upper extremity supported;During functional activity Standing balance-Leahy Scale: Poor                               Pertinent Vitals/Pain Pain Assessment: Faces Faces Pain Scale: Hurts little more Pain Location: significant brusing and pain on RUE to ck bp but LUE has IV Pain  Descriptors / Indicators: Guarding Pain Intervention(s): Limited activity within patient's tolerance;Monitored during session;Repositioned    Home Living Family/patient expects to be discharged to:: Private residence Living Arrangements: Spouse/significant  other Available Help at Discharge: Family;Available 24 hours/day Type of Home: House Home Access: Ramped entrance (ramp on sidewalk to threshold)       Home Layout: One level Home Equipment: Rollator (4 wheels);Shower seat Additional Comments: pt reports she has not had any of her falls while on rollator    Prior Function Prior Level of Function : Needs assist       Physical Assist : Mobility (physical) Mobility (physical): Transfers ADLs (physical): Bathing;Dressing;IADLs Mobility Comments: falls but not from walker per pt ADLs Comments: incontinent of urine     Hand Dominance   Dominant Hand: Right    Extremity/Trunk Assessment   Upper Extremity Assessment Upper Extremity Assessment: Defer to OT evaluation    Lower Extremity Assessment Lower Extremity Assessment: Generalized weakness (BLE strength is mildly reduced, 4+ generally)    Cervical / Trunk Assessment Cervical / Trunk Assessment: Normal  Communication   Communication: HOH  Cognition Arousal/Alertness: Awake/alert Behavior During Therapy: WFL for tasks assessed/performed Overall Cognitive Status: No family/caregiver present to determine baseline cognitive functioning Area of Impairment: Problem solving;Awareness;Safety/judgement;Attention                 Orientation Level: Situation Current Attention Level: Selective Memory: Decreased short-term memory Following Commands: Follows one step commands with increased time Safety/Judgement: Decreased awareness of safety Awareness: Emergent (slow to decide to sit with dizziness) Problem Solving: Slow processing;Requires verbal cues;Requires tactile cues General Comments: limited mobility inferred from PLOF information        General Comments General comments (skin integrity, edema, etc.): BP readings were hypertensive, no low values:  supine 146/79, sitting 150/94 and standing 141/125, O2 sats 91%    Exercises     Assessment/Plan    PT  Assessment Patient needs continued PT services  PT Problem List Decreased activity tolerance;Decreased balance;Decreased mobility;Decreased coordination;Decreased knowledge of use of DME;Decreased skin integrity;Pain       PT Treatment Interventions DME instruction;Gait training;Functional mobility training;Therapeutic activities;Therapeutic exercise;Balance training;Neuromuscular re-education;Patient/family education    PT Goals (Current goals can be found in the Care Plan section)  Acute Rehab PT Goals Patient Stated Goal: to get home and feel better PT Goal Formulation: With patient Time For Goal Achievement: 06/24/21 Potential to Achieve Goals: Good    Frequency Min 3X/week   Barriers to discharge Decreased caregiver support home with her husband who is her age    Co-evaluation               AM-PAC PT "6 Clicks" Mobility  Outcome Measure Help needed turning from your back to your side while in a flat bed without using bedrails?: A Little Help needed moving from lying on your back to sitting on the side of a flat bed without using bedrails?: A Little Help needed moving to and from a bed to a chair (including a wheelchair)?: A Little Help needed standing up from a chair using your arms (e.g., wheelchair or bedside chair)?: A Little Help needed to walk in hospital room?: A Little Help needed climbing 3-5 steps with a railing? : Total 6 Click Score: 16    End of Session Equipment Utilized During Treatment: Gait belt Activity Tolerance: Patient limited by fatigue;Treatment limited secondary to medical complications (Comment) Patient left: in bed;with call bell/phone within reach;with bed alarm set Nurse Communication: Mobility status PT  Visit Diagnosis: Unsteadiness on feet (R26.81);Repeated falls (R29.6)    Time: 1010-1031 PT Time Calculation (min) (ACUTE ONLY): 21 min   Charges:   PT Evaluation $PT Eval Moderate Complexity: 1 Mod         Ramond Dial 06/10/2021, 11:55 AM  Mee Hives, PT PhD Acute Rehab Dept. Number: [place]

## 2021-06-10 NOTE — Consult Note (Signed)
° °  [facility] Inpatient Consult   06/10/2021  [name] 01/13/1946 [id number]  [facility] [ACO] Patient: [employer] Medicare  Primary Care Provider:  [name], DO, [facility], is listed for the Transition of Care [TOC] follow up calls and appointments.   Patient screened for hospitalization with noted high risk score for unplanned readmission risk and to assess for potential [facility] service needs for post hospital transition.  Review of patient's medical record reveals patient is being recommended by PT for home with [facility] care and possible outpatient vestibular PT assessment for dizziness with falls noted.   Plan:  Continue to follow progress and disposition to assess for post hospital care management needs.    For questions contact:   Natividad Brood, RN BSN [facility] Liaison  [phone] business mobile phone Toll free office [phone]  Fax number: [phone] [email or web address]

## 2021-06-10 NOTE — Progress Notes (Addendum)
[facility]  PROGRESS NOTE  [name] XKG:[id number] DOB: 09-18-1945 DOA: 06/05/2021 PCP: [name], DO   Brief HPI:   75 year old female with a history of hypertension, hyperlipidemia, CAD, diastolic CHF, chronic atrial fibrillation on anticoagulation, hypothyroidism, morbid obesity presented to ED with complaints of cough and shortness of breath for 3 weeks.  She was found to have oxygen saturation of 83% on room air.  In the ED CT chest noted widespread progression of bronchiectasis with bilateral lower lobe nodular and patchy opacities suggestive of active infection.    Subjective   Patient seen and examined, denies nausea or vomiting.  No abdominal pain.  Tolerating diet well.   Assessment/Plan:     Community-acquired pneumonia/bronchiectasis -Improved with antibiotics -Completed 10 days of doxycycline  Acute hypoxemic respiratory failure -Resolved -Patient is on room air at this time  Cholelithiasis with acute cholecystitis -Abdominal ultrasound was suggestive of acute cholecystitis -General surgery was consulted, initially recommended cholecystectomy -Cardiology was consulted for preop clearance and it was felt felt that her anesthesia deemed to be too high risk from a respiratory standpoint -MRCP was negative for choledocholithiasis -HIDA scan negative for acute cholecystitis with history of chronic cholecystitis -LFTs are within normal limits -General surgery recommends that if patient develops severe symptoms percutaneous intervention would be appropriate  Paroxysmal atrial fibrillation -Heart rate controlled -Continue metoprolol 37.5 mg p.o. twice daily, patient is also on sotalol -Continue anticoagulation with Eliquis  Chronic diastolic CHF -Euvolemic -Continue Lasix 40 mg p.o. twice daily  Acute delirium -Resolved -Ammonia level 34, B12 level 2223  Chest pain -Patient had chest pain which is resolved -Troponin was mild elevated, felt to be  demand ischemia -Normal EF on TTE with no wall motion abnormality  Hypertensive urgency -Resolved  Hypertension -Continue losartan, metoprolol  Hypomagnesemia -Replete  Hypothyroidism -Continue Synthroid  Hypokalemia -Potassium was 2.8 -We will give KCl 10 mEq IV x4 doses -Follow BMP in am  Hyperlipidemia -Continue Crestor    Medications     allopurinol  100 mg Oral BID   apixaban  5 mg Oral BID   doxycycline  100 mg Oral Q12H   famotidine  20 mg Oral QHS   feeding supplement  237 mL Oral BID BM   furosemide  40 mg Oral BID   guaiFENesin  600 mg Oral BID   haloperidol lactate  2 mg Intramuscular Once   levothyroxine  88 mcg Oral Daily   losartan  50 mg Oral Daily   metoprolol succinate  37.5 mg Oral BID   predniSONE  5 mg Oral Q breakfast   QUEtiapine  12.5 mg Oral QHS   rosuvastatin  5 mg Oral QODAY   saccharomyces boulardii  250 mg Oral BID   sodium chloride flush  3 mL Intravenous Q12H   sotalol  80 mg Oral BID     Data Reviewed:   CBG:  Recent Labs  Lab 06/08/21 0647  GLUCAP 90    SpO2: 97 % O2 Flow Rate (L/min): 2 L/min    Vitals:   06/10/21 0516 06/10/21 0549 06/10/21 0848 06/10/21 1056  BP: 99/88  (!) 156/72 (!) 151/97  Pulse: 66 66 65 64  Resp: 17   18  Temp: 97.8 F (36.6 C) (!) 97.5 F (36.4 C)  (!) 97.4 F (36.3 C)  TempSrc: Oral Oral  Oral  SpO2: 96% 96%  97%  Weight: 95 kg     Height:         Intake/Output Summary (Last  24 hours) at 06/10/2021 1723 Last data filed at 06/10/2021 1704 Gross per 24 hour  Intake 610 ml  Output 875 ml  Net -265 ml    12/27 1901 - 12/29 0700 In: 1786.9 [P.O.:700; I.V.:186.9] Out: 1300 [Urine:1300]  Filed Weights   06/08/21 0544 06/09/21 0430 06/10/21 0516  Weight: 95.7 kg 95.4 kg 95 kg    Data Reviewed: Basic Metabolic Panel: Recent Labs  Lab 06/06/21 0427 06/07/21 0733 06/08/21 0436 06/09/21 0212 06/10/21 0454  NA 137 143 145 141 141  K 3.8 2.9* 3.5 3.4* 2.8*  CL 95* 98  106 102 105  CO2 29 32 31 28 28   GLUCOSE 126* 98 98 95 84  BUN 21 23 24* 20 19  CREATININE 1.13* 1.05* 0.95 0.97 0.85  CALCIUM 8.8* 8.4* 8.0* 7.5* 7.6*  MG  --  1.5* 2.4  --   --    Liver Function Tests: Recent Labs  Lab 06/06/21 1448 06/07/21 0733 06/08/21 0436 06/09/21 0212 06/10/21 0454  AST 46* 29 23 28 27   ALT 23 23 19 19 18   ALKPHOS 66 58 56 56 53  BILITOT 1.0 0.5 0.6 0.7 0.7  PROT 7.1 6.4* 6.0* 6.3* 5.8*  ALBUMIN 3.2* 3.0* 2.9* 3.2* 3.0*   No results for input(s): LIPASE, AMYLASE in the last 168 hours. Recent Labs  Lab 06/10/21 0454  AMMONIA 34   CBC: Recent Labs  Lab 06/05/21 0233 06/06/21 0427 06/07/21 0733 06/08/21 0436 06/09/21 0212 06/10/21 0454  WBC 11.4* 9.4 9.4 10.0 10.6* 8.8  NEUTROABS 8.3*  --  6.1 6.1  --   --   HGB 15.1* 14.8 14.2 13.7 14.2 14.3  HCT 46.8* 45.2 42.5 41.2 42.9 43.4  MCV 104.9* 101.6* 100.7* 101.2* 101.7* 101.4*  PLT 171 138* 157 174 175 191   Cardiac Enzymes: No results for input(s): CKTOTAL, CKMB, CKMBINDEX, TROPONINI in the last 168 hours. BNP (last 3 results) Recent Labs    06/05/21 0233  BNP 303.7*    ProBNP (last 3 results) Recent Labs    03/15/21 1227  PROBNP 822*    CBG: Recent Labs  Lab 06/08/21 [place]       Radiology Reports  No results found.     Antibiotics: Anti-infectives (From admission, onward)    Start     Dose/Rate Route Frequency Ordered Stop   06/10/21 1000  doxycycline (VIBRA-TABS) tablet 100 mg        100 mg Oral Every 12 hours 06/09/21 1437 06/12/21 2159   06/10/21 0000  doxycycline (VIBRAMYCIN) 100 mg in sodium chloride 0.9 % 250 mL IVPB        100 mg 125 mL/hr over 120 Minutes Intravenous Every 12 hours 06/09/21 1440 06/10/21 0141   06/06/21 1615  metroNIDAZOLE (FLAGYL) IVPB 500 mg  Status:  Discontinued        500 mg 100 mL/hr over 60 Minutes Intravenous Every 8 hours 06/06/21 1523 06/09/21 1437   06/06/21 1515  metroNIDAZOLE (FLAGYL) IVPB 500 mg  Status:   Discontinued        500 mg 100 mL/hr over 60 Minutes Intravenous  Once 06/06/21 1423 06/06/21 1523   06/06/21 1000  cefTRIAXone (ROCEPHIN) 2 g in sodium chloride 0.9 % 100 mL IVPB  Status:  Discontinued        2 g 200 mL/hr over 30 Minutes Intravenous Every 24 hours 06/05/21 1345 06/05/21 1349   06/06/21 1000  cefTRIAXone (ROCEPHIN) 2 g in sodium chloride 0.9 % 100  mL IVPB        2 g 200 mL/hr over 30 Minutes Intravenous Every 24 hours 06/05/21 1349 06/09/21 1209   06/06/21 0000  doxycycline (VIBRAMYCIN) 100 mg in sodium chloride 0.9 % 250 mL IVPB        100 mg 125 mL/hr over 120 Minutes Intravenous Every 12 hours 06/05/21 1350 06/09/21 1505   06/05/21 1015  levofloxacin (LEVAQUIN) IVPB 750 mg        750 mg 100 mL/hr over 90 Minutes Intravenous  Once 06/05/21 1002 06/05/21 1222         DVT prophylaxis: Apixaban  Code Status: Full code  Family Communication: No family at bedside   Consultants: General surgery  Procedures:     Objective    Physical Examination:   General-appears in no acute distress Heart-S1-S2, regular, no murmur auscultated Lungs-clear to auscultation bilaterally, no wheezing or crackles auscultated Abdomen-soft, mild tenderness in right upper quadrant , no organomegaly Extremities-trace  edema in the lower extremities Neuro-alert, oriented x3, no focal deficit noted  Status is: Inpatient  Dispo: The patient is from: Home              Anticipated d/c is to: Home with home health PT              Anticipated d/c date is: 06/11/2021              Patient currently not stable for discharge  Barrier to discharge-pending PT evaluation  COVID-19 Labs  Recent Labs    06/10/21 0454  FERRITIN 255    Lab Results  Component Value Date   [place] NEGATIVE 06/05/2021            Recent Results (from the past 240 hour(s))  Resp Panel by RT-PCR (Flu A&B, Covid) Nasopharyngeal Swab     Status: None   Collection Time: 06/05/21  2:39 AM    Specimen: Nasopharyngeal Swab; Nasopharyngeal(NP) swabs in vial transport medium  Result Value Ref Range Status   SARS Coronavirus 2 by RT PCR NEGATIVE NEGATIVE Final    Comment: (NOTE) SARS-CoV-2 target nucleic acids are NOT DETECTED.  The SARS-CoV-2 RNA is generally detectable in upper respiratory specimens during the acute phase of infection. The lowest concentration of SARS-CoV-2 viral copies this assay can detect is 138 copies/mL. A negative result does not preclude SARS-Cov-2 infection and should not be used as the sole basis for treatment or other patient management decisions. A negative result may occur with  improper specimen collection/handling, submission of specimen other than nasopharyngeal swab, presence of viral mutation(s) within the areas targeted by this assay, and inadequate number of viral copies(<138 copies/mL). A negative result must be combined with clinical observations, patient history, and epidemiological information. The expected result is Negative.  Fact Sheet for Patients:  EntrepreneurPulse.com.au  Fact Sheet for Healthcare Providers:  IncredibleEmployment.be  This test is no t yet approved or cleared by the [place] FDA and  has been authorized for detection and/or diagnosis of SARS-CoV-2 by FDA under an Emergency Use Authorization (EUA). This EUA will remain  in effect (meaning this test can be used) for the duration of the COVID-19 declaration under Section 564(b)(1) of the Act, 21 U.S.C.section 360bbb-3(b)(1), unless the authorization is terminated  or revoked sooner.       Influenza A by PCR NEGATIVE NEGATIVE Final   Influenza B by PCR NEGATIVE NEGATIVE Final    Comment: (NOTE) The Xpert Xpress SARS-CoV-2/FLU/RSV plus assay is intended as an aid  in the diagnosis of influenza from Nasopharyngeal swab specimens and should not be used as a sole basis for treatment. Nasal washings and aspirates are  unacceptable for Xpert Xpress SARS-CoV-2/FLU/RSV testing.  Fact Sheet for Patients: EntrepreneurPulse.com.au  Fact Sheet for Healthcare Providers: IncredibleEmployment.be  This test is not yet approved or cleared by the [place] FDA and has been authorized for detection and/or diagnosis of SARS-CoV-2 by FDA under an Emergency Use Authorization (EUA). This EUA will remain in effect (meaning this test can be used) for the duration of the COVID-19 declaration under Section 564(b)(1) of the Act, 21 U.S.C. section 360bbb-3(b)(1), unless the authorization is terminated or revoked.  Performed at [facility], [place] [address] [place], [place] [address]   Urine Culture     Status: Abnormal   Collection Time: 06/05/21  9:12 AM   Specimen: Urine, Clean Catch  Result Value Ref Range Status   Specimen Description URINE, CLEAN CATCH  Final   Special Requests NONE  Final   Culture (A)  Final    30,000 COLONIES/mL DIPHTHEROIDS(CORYNEBACTERIUM SPECIES) Standardized susceptibility testing for this organism is not available. Performed at [facility], [place] [address] [place], [place] [address]    Report Status 06/06/2021 FINAL  Final  Surgical pcr screen     Status: None   Collection Time: 06/07/21  9:11 PM   Specimen: Nasal Mucosa; Nasal Swab  Result Value Ref Range Status   MRSA, PCR NEGATIVE NEGATIVE Final   Staphylococcus aureus NEGATIVE NEGATIVE Final    Comment: (NOTE) The Xpert SA Assay (FDA approved for NASAL specimens in patients 47 years of age and older), is one component of a comprehensive surveillance program. It is not intended to diagnose infection nor to guide or monitor treatment. Performed at [facility], [place] [address], [place], [place] [address]     [facility] If 7PM-7AM, please contact night-coverage at [email or web address], Office  [phone]   06/10/2021, 5:23 PM  LOS: 5 days

## 2021-06-10 NOTE — Discharge Instructions (Signed)

## 2021-06-10 NOTE — Progress Notes (Signed)
Occupational Therapy Treatment Patient Details Name: [name] MRN: [id number] DOB: [birth year] Today's Date: 06/10/2021   History of present illness Pt is a 75 y/o female who presented with SOB/coughing for 3 weeks. CT of chest showedwidespread progression of bronchiectasis with bilateral lower lobe nodular and patchy opacities suggestive of PNA. PMH: HTN, HLD, CAD, diastolic CHF, chronic atrial fibrillation on anticoagulation, hypothyroidism, and morbid obesity   OT comments  PTA, pt lives with spouse, typically ambulatory with Rollator and reports spouse assists prn with bathing/dressing. Pt presents now with deficits in endurance, standing balance, and cognition. Noted limitations today due to dizziness/nausea. Pt pleasant, consistently follows directions though noted with some decreased awareness. Pt overall Min A for bed mobility, min guard for basic transfers with RW. Pt requires Supervision for UB ADLs and Mod A for LB ADLs due to deficits. Orthostatic vitals negative. Pt does endorse a hx of vertigo and may benefit from vestibular eval if dizziness persists as this places pt at higher risk for falls. Anticipate pt to progress well to return home with [place] and light assist from husband. If pt's spouse unable to provide assistance, may need to consider SNF rehab.    Recommendations for follow up therapy are one component of a multi-disciplinary discharge planning process, led by the attending physician.  Recommendations may be updated based on patient status, additional functional criteria and insurance authorization.    Follow Up Recommendations  Home health OT    Assistance Recommended at Discharge Frequent or constant Supervision/Assistance  Equipment Recommendations  BSC/3in1    Recommendations for Other Services      Precautions / Restrictions Precautions Precautions: Fall;Other (comment) Precaution Comments: dizziness, hx of vertigo, monitor BP (HTN) Restrictions Weight  Bearing Restrictions: No       Mobility Bed Mobility Overal bed mobility: Needs Assistance Bed Mobility: Supine to Sit;Rolling;Sit to Sidelying Rolling: Supervision   Supine to sit: Supervision;HOB elevated   Sit to sidelying: Min assist General bed mobility comments: increased time/effort to get EOB, Min A to get BLE back into bed    Transfers Overall transfer level: Needs assistance Equipment used: Rolling walker (2 wheels) Transfers: Sit to/from Stand Sit to Stand: Min guard                 Balance Overall balance assessment: Needs assistance;History of Falls Sitting-balance support: No upper extremity supported;Feet supported Sitting balance-Leahy Scale: Fair     Standing balance support: Bilateral upper extremity supported;During functional activity Standing balance-Leahy Scale: Poor                             ADL either performed or assessed with clinical judgement   ADL Overall ADL's : Needs assistance/impaired Eating/Feeding: Set up;Sitting   Grooming: Set up;Sitting   Upper Body Bathing: Supervision/ safety;Sitting   Lower Body Bathing: Moderate assistance;Sit to/from stand   Upper Body Dressing : Supervision/safety;Sitting   Lower Body Dressing: Moderate assistance;Sit to/from stand Lower Body Dressing Details (indicate cue type and reason): difficulty reaching to B feet Toilet Transfer: Min guard;Stand-pivot;Rolling walker (2 wheels)   Toileting- Clothing Manipulation and Hygiene: Moderate assistance;Sit to/from stand         General ADL Comments: Pt with primary limitations due to dizziness and nausea today, overall WFL strength but at increased fall risk with ADLs    Extremity/Trunk Assessment Upper Extremity Assessment Upper Extremity Assessment: Overall WFL for tasks assessed (bruising noted on BUE)   Lower Extremity  Assessment Lower Extremity Assessment: Defer to PT evaluation   Cervical / Trunk Assessment Cervical /  Trunk Assessment: Normal    Vision Ability to See in Adequate Light: 0 Adequate Patient Visual Report: No change from baseline Vision Assessment?: No apparent visual deficits   Perception     Praxis      Cognition Arousal/Alertness: Awake/alert Behavior During Therapy: WFL for tasks assessed/performed Overall Cognitive Status: No family/caregiver present to determine baseline cognitive functioning Area of Impairment: Orientation;Attention;Memory;Following commands;Safety/judgement;Awareness;Problem solving                 Orientation Level: Disoriented to;Situation Current Attention Level: Sustained Memory: Decreased short-term memory Following Commands: Follows one step commands consistently;Follows one step commands with increased time Safety/Judgement: Decreased awareness of deficits;Decreased awareness of safety Awareness: Emergent Problem Solving: Slow processing;Requires verbal cues General Comments: Pt pleasant, hyperfocused on getting back in bed at times but easy to redirect. Pt able to report correct month/year, being at hospital though unsure of which one. Pt with slower processing and occasionally with provide answers inappropriate to questions but could also be due to [facility]          Exercises     Shoulder Instructions       General Comments orthostatic BP- with hypertensive readings with activity, endorses dizziness and nausea. SpO2 90-91% on RA    Pertinent Vitals/ Pain       Pain Assessment: Faces Faces Pain Scale: Hurts little more Pain Location: UE with BP readings Pain Descriptors / Indicators: Guarding;Grimacing;Moaning Pain Intervention(s): Limited activity within patient's tolerance;Monitored during session;Repositioned  Home Living Family/patient expects to be discharged to:: Private residence Living Arrangements: Spouse/significant other Available Help at Discharge: Family;Available 24 hours/day Type of Home: House Home Access: Ramped  entrance (small cement ramp)     Home Layout: One level     Bathroom Shower/Tub: Teacher, early years/pre: Standard     Home Equipment: Rollator (4 wheels);Shower seat          Prior Functioning/Environment              Frequency  Min 2X/week        Progress Toward Goals  OT Goals(current goals can now be found in the care plan section)     Acute Rehab OT Goals Patient Stated Goal: feel better OT Goal Formulation: With patient Time For Goal Achievement: 06/24/21 Potential to Achieve Goals: Good  Plan      Co-evaluation                 AM-PAC OT "6 Clicks" Daily Activity     Outcome Measure   Help from another person eating meals?: A Little Help from another person taking care of personal grooming?: A Little Help from another person toileting, which includes using toliet, bedpan, or urinal?: A Lot Help from another person bathing (including washing, rinsing, drying)?: A Lot Help from another person to put on and taking off regular upper body clothing?: A Little Help from another person to put on and taking off regular lower body clothing?: A Lot 6 Click Score: 15    End of Session Equipment Utilized During Treatment: Rolling walker (2 wheels)  OT Visit Diagnosis: Unsteadiness on feet (R26.81);Other abnormalities of gait and mobility (R26.89);Muscle weakness (generalized) (M62.81)   Activity Tolerance Other (comment) (limited by nausea)   Patient Left in bed;with call bell/phone within reach;with bed alarm set   Nurse Communication Mobility status        Time:  0511-0211 OT Time Calculation (min): 23 min  Charges: OT General Charges $OT Visit: 1 Visit OT Evaluation $OT Eval Moderate Complexity: 1 Mod  Malachy Chamber, OTR/L Acute Rehab Services Office: [phone]   Layla Maw 06/10/2021, 11:02 AM

## 2021-06-11 LAB — BASIC METABOLIC PANEL
Anion gap: 14 (ref 5–15)
Anion gap: 14 (ref 5–15)
BUN: 21 mg/dL (ref 8–23)
BUN: 25 mg/dL — ABNORMAL HIGH (ref 8–23)
CO2: 24 mmol/L (ref 22–32)
CO2: 24 mmol/L (ref 22–32)
Calcium: 8.1 mg/dL — ABNORMAL LOW (ref 8.9–10.3)
Calcium: 8.6 mg/dL — ABNORMAL LOW (ref 8.9–10.3)
Chloride: 101 mmol/L (ref 98–111)
Chloride: 103 mmol/L (ref 98–111)
Creatinine, Ser: 1.08 mg/dL — ABNORMAL HIGH (ref 0.44–1.00)
Creatinine, Ser: 1.16 mg/dL — ABNORMAL HIGH (ref 0.44–1.00)
GFR, Estimated: 54 mL/min — ABNORMAL LOW (ref >60)
GFR, Estimated: 49 mL/min — ABNORMAL LOW (ref >60)
Glucose, Bld: 104 mg/dL — ABNORMAL HIGH (ref 70–99)
Glucose, Bld: 170 mg/dL — ABNORMAL HIGH (ref 70–99)
Potassium: 3.1 mmol/L — ABNORMAL LOW (ref 3.5–5.1)
Potassium: 4.5 mmol/L (ref 3.5–5.1)
Sodium: 139 mmol/L (ref 135–145)
Sodium: 141 mmol/L (ref 135–145)

## 2021-06-11 LAB — POTASSIUM: Potassium: 6.4 mmol/L (ref 3.5–5.1)

## 2021-06-11 MED ORDER — GUAIFENESIN ER 600 MG PO TB12
600.0000 mg | ORAL_TABLET | Freq: Two times a day (BID) | ORAL | 0 refills | Status: DC
Start: 2021-06-11 — End: 2023-10-25

## 2021-06-11 MED ORDER — POTASSIUM CHLORIDE CRYS ER 20 MEQ PO TBCR
40.0000 meq | EXTENDED_RELEASE_TABLET | Freq: Once | ORAL | Status: AC
  Administered 2021-06-11: 09:00:00 40 meq via ORAL
  Filled 2021-06-11: qty 2

## 2021-06-11 MED ORDER — POTASSIUM CHLORIDE CRYS ER 20 MEQ PO TBCR: 40.0000 meq | EXTENDED_RELEASE_TABLET | Freq: Once | ORAL | Status: DC

## 2021-06-11 MED ORDER — POTASSIUM CHLORIDE CRYS ER 20 MEQ PO TBCR: 20.0000 meq | EXTENDED_RELEASE_TABLET | Freq: Two times a day (BID) | ORAL | 11 refills | Status: DC

## 2021-06-11 MED ORDER — POTASSIUM CHLORIDE CRYS ER 20 MEQ PO TBCR
40.0000 meq | EXTENDED_RELEASE_TABLET | Freq: Once | ORAL | Status: AC
  Administered 2021-06-11: 03:00:00 40 meq via ORAL
  Filled 2021-06-11: qty 2

## 2021-06-11 NOTE — Progress Notes (Signed)
Physical Therapy Treatment Patient Details Name: [name] MRN: [id number] DOB: [birth year] Today's Date: 06/11/2021   History of Present Illness Pt is a 75 y/o female who presented 12/24 with SOB/coughing for 3 weeks. CT of chest showed widespread progression of bronchiectasis with bilateral lower lobe nodular and patchy opacities suggestive of PNA. Has non-surgical dx of cholelithasis.  PMH: HTN, HLD, CAD, diastolic CHF, chronic atrial fibrillation on anticoagulation, hypothyroidism, and morbid obesity    PT Comments    Pt admitted with above diagnosis. Pt was able to ambulate a short distance and pt is most likely at baseline.  Pt going home today. Husband is comfortable with managing pt at home.  Pt currently with functional limitations due to the deficits listed below (see PT Problem List). Pt will benefit from skilled PT to increase their independence and safety with mobility to allow discharge to the venue listed below.      Recommendations for follow up therapy are one component of a multi-disciplinary discharge planning process, led by the attending physician.  Recommendations may be updated based on patient status, additional functional criteria and insurance authorization.  Follow Up Recommendations  Home health PT     Assistance Recommended at Discharge Frequent or constant Supervision/Assistance  Equipment Recommendations  None recommended by PT    Recommendations for Other Services       Precautions / Restrictions Precautions Precautions: Fall;Other (comment) Restrictions Weight Bearing Restrictions: No     Mobility  Bed Mobility Overal bed mobility: Needs Assistance Bed Mobility: Supine to Sit;Sit to Supine Rolling: Supervision   Supine to sit: Min guard Sit to supine: Min assist Sit to sidelying: Min assist General bed mobility comments: cued sequence and safety every time she moved    Transfers Overall transfer level: Needs assistance Equipment used:  Rolling walker (2 wheels) Transfers: Sit to/from Stand;Bed to chair/wheelchair/BSC Sit to Stand: Min guard     Step pivot transfers: Min guard     General transfer comment: Pt took pivotal steps to 3N1 first and urinates/dribbles as she moves. Needed assist to be cleaned    Ambulation/Gait Ambulation/Gait assistance: Min guard;Min assist Gait Distance (Feet): 30 Feet Assistive device: Rolling walker (2 wheels);1 person hand held assist Gait Pattern/deviations: Step-to pattern;Wide base of support;Trunk flexed;Antalgic Gait velocity: reduced Gait velocity interpretation: <1.31 ft/sec, indicative of household ambulator   General Gait Details: Pt was able to walk to door and back with pad placed between legs for her incontinence. Removed pad when pt returned to bed. Husband present and he is comfortable assisting pt at home.   Stairs             Wheelchair Mobility    Modified Rankin (Stroke Patients Only)       Balance Overall balance assessment: Needs assistance Sitting-balance support: Feet supported Sitting balance-Leahy Scale: Fair     Standing balance support: Bilateral upper extremity supported;During functional activity Standing balance-Leahy Scale: Poor Standing balance comment: Pt relies on UE support and external support as well                            Cognition Arousal/Alertness: Awake/alert Behavior During Therapy: WFL for tasks assessed/performed Overall Cognitive Status: No family/caregiver present to determine baseline cognitive functioning Area of Impairment: Problem solving;Awareness;Safety/judgement;Attention                 Orientation Level: Situation Current Attention Level: Selective Memory: Decreased short-term memory Following Commands: Follows one  step commands with increased time Safety/Judgement: Decreased awareness of safety Awareness: Emergent (slow to decide to sit with dizziness) Problem Solving: Slow  processing;Requires verbal cues;Requires tactile cues General Comments: limited mobility inferred from PLOF information        Exercises General Exercises - Lower Extremity Long Arc Quad: AROM;Both;10 reps;Seated    General Comments General comments (skin integrity, edema, etc.): VSS      Pertinent Vitals/Pain Pain Assessment: Faces Faces Pain Scale: Hurts little more Pain Location: significant brusing and pain on RUE to ck bp but LUE has IV Pain Descriptors / Indicators: Guarding Pain Intervention(s): Limited activity within patient's tolerance;Monitored during session;Repositioned    Home Living                          Prior Function            PT Goals (current goals can now be found in the care plan section) Acute Rehab PT Goals Patient Stated Goal: to get home and feel better Progress towards PT goals: Progressing toward goals    Frequency    Min 3X/week      PT Plan Current plan remains appropriate    Co-evaluation              AM-PAC PT "6 Clicks" Mobility   Outcome Measure  Help needed turning from your back to your side while in a flat bed without using bedrails?: A Little Help needed moving from lying on your back to sitting on the side of a flat bed without using bedrails?: A Little Help needed moving to and from a bed to a chair (including a wheelchair)?: A Little Help needed standing up from a chair using your arms (e.g., wheelchair or bedside chair)?: A Little Help needed to walk in hospital room?: A Little Help needed climbing 3-5 steps with a railing? : Total 6 Click Score: 16    End of Session Equipment Utilized During Treatment: Gait belt Activity Tolerance: Patient limited by fatigue Patient left: in bed;with call bell/phone within reach;with bed alarm set;with family/visitor present Nurse Communication: Mobility status PT Visit Diagnosis: Unsteadiness on feet (R26.81);Repeated falls (R29.6)     Time: 1000-1031 PT  Time Calculation (min) (ACUTE ONLY): 31 min  Charges:  $Gait Training: 8-22 mins $Therapeutic Exercise: 8-22 mins                     Corrin Hingle M,PT Acute Rehab Services [phone] (pager)    Alvira Philips 06/11/2021, 1:23 PM

## 2021-06-11 NOTE — TOC Progression Note (Addendum)
Transition of Care [facility]) - Progression Note    Patient Details  Name: [name] MRN: [id number] Date of Birth: 1945/12/09  Transition of Care [facility]) CM/SW [place], RN Phone [phone]  06/11/2021, 1:21 PM  Clinical Narrative:    [facility] consulted for home health referral. CM at bedside to offer choice. Patient and husband have no preference for [facility] agency. [place] referral has been accepted by [place] with Alvis Lemmings. No other needs noted at this time.        Expected Discharge Plan and Services                                     HH Arranged: PT, OT HH Agency: [facility] Date [facility] Agency Contacted: 06/11/21 Time [place]: 1321 Representative spoke with at [place]: [place] ([place]) Interventions    Readmission Risk Interventions No flowsheet data found.

## 2021-06-11 NOTE — Progress Notes (Signed)
Received a call from lab stating pt's potassium was critically elevated at 6.4. She stated that it was slightly hemolyzed. Message sent to the provider to make him aware.

## 2021-06-11 NOTE — Progress Notes (Signed)
Patient discharged: Home with family.  Left via: Wheelchair  Discharge paperwork reviewed and given: to patient and family. Teach back completed. IV and telemetry disconnected. Belongings given to patient.  

## 2021-06-11 NOTE — Discharge Summary (Signed)
Physician Discharge Summary  [name]:[id number] DOB: 1945-08-21 DOA: 06/05/2021  PCP: [name], DO  Admit date: 06/05/2021 Discharge date: 06/11/2021  Time spent: 60 minutes  Recommendations for Outpatient Follow-up:  Follow-up general surgery as outpatient   Discharge Diagnoses:  Principal Problem:   CAP (community acquired pneumonia) Active Problems:   CAD (coronary artery disease)   Morbid obesity with BMI of 50.0-59.9, adult (HCC)   Chronic diastolic CHF (congestive heart failure) (HCC)   PAF (paroxysmal atrial fibrillation) ([place])   Acute respiratory failure with hypoxia (HCC)   Elevated troponin   Hypothyroidism   Leukocytosis   Discharge Condition: Stable  Diet recommendation: Heart healthy diet  Filed Weights   06/09/21 0430 06/10/21 0516 06/11/21 0424  Weight: 95.4 kg 95 kg 94.1 kg    History of present illness:  75 year old female with a history of hypertension, hyperlipidemia, CAD, diastolic CHF, chronic atrial fibrillation on anticoagulation, hypothyroidism, morbid obesity presented to ED with complaints of cough and shortness of breath for 3 weeks.  She was found to have oxygen saturation of 83% on room air.  In the ED CT chest noted widespread progression of bronchiectasis with bilateral lower lobe nodular and patchy opacities suggestive of active infection.  Hospital Course:   Community-acquired pneumonia/bronchiectasis -Improved with antibiotics -Completed 9 days of doxycycline -Continue Mucinex 600 mg p.o. twice daily   Acute hypoxemic respiratory failure -Resolved -Patient is on room air at this time   Cholelithiasis with acute cholecystitis -Abdominal ultrasound was suggestive of acute cholecystitis -General surgery was consulted, initially recommended cholecystectomy -Cardiology was consulted for preop clearance and it was felt felt that her anesthesia deemed to be too high risk from a respiratory standpoint -MRCP was negative for  choledocholithiasis -HIDA scan negative for acute cholecystitis with history of chronic cholecystitis -LFTs are within normal limits -General surgery recommends that if patient develops severe symptoms percutaneous intervention would be appropriate -Follow-up general surgery as outpatient   Paroxysmal atrial fibrillation -Heart rate controlled -Continue metoprolol 37.5 mg p.o. twice daily, patient is also on sotalol -Continue anticoagulation with Eliquis   Chronic diastolic CHF -Euvolemic -Continue Lasix 40 mg p.o. twice daily   Acute delirium -Resolved -Ammonia level 34, B12 level 2223   Chest pain -Patient had chest pain which is resolved -Troponin was mild elevated, felt to be demand ischemia -Normal EF on TTE with no wall motion abnormality   Hypertensive urgency -Resolved   Hypertension -Continue losartan, metoprolol   Hypomagnesemia -Replete   Hypothyroidism -Continue Synthroid   Hypokalemia -Replete -Continue taking K-Dur 20 milliequivalents p.o. twice daily along with Lasix  Hyperlipidemia -Continue Crestor    Procedures:   Consultations: Cardiology General surgery  Discharge Exam: Vitals:   06/11/21 0835 06/11/21 1108  BP: 140/90 (!) 156/76  Pulse: 80 62  Resp: 20 (!) 21  Temp:  97.8 F (36.6 C)  SpO2: 93% 95%    General: Appears in no acute distress Cardiovascular: S1-S2, regular, no murmur auscultated Respiratory: Clear to auscultation bilaterally  Discharge Instructions   Discharge Instructions     Diet - low sodium heart healthy   Complete by: As directed    Increase activity slowly   Complete by: As directed       Allergies as of 06/11/2021       Reactions   Butoconazole Itching, Rash, Other (See Comments)   'Redness and swelling feverish ' 'worse symptoms '   Keflex [cephalexin] Diarrhea   Lipitor [atorvastatin Calcium] Other (See Comments)   '  Muscle weakness'     Cortisone    This medication causes confusion    Hydrocodone Other (See Comments)        Medication List     STOP taking these medications    azithromycin 250 MG tablet Commonly known as: ZITHROMAX   fluconazole 150 MG tablet Commonly known as: DIFLUCAN       TAKE these medications    acetaminophen 500 MG tablet Commonly known as: TYLENOL Take 500-1,000 mg by mouth every 6 (six) hours as needed for mild pain or fever.   albuterol 108 (90 Base) MCG/ACT inhaler Commonly known as: VENTOLIN HFA albuterol sulfate HFA 90 mcg/actuation aerosol inhaler  INHALE 1-2 PUFFS BY MOUTH EVERY 6 HOURS AS NEEDED FOR WHEEZE OR SHORTNESS OF BREATH   alendronate 70 MG tablet Commonly known as: FOSAMAX Take 1 tablet by mouth every Tuesday.   allopurinol 100 MG tablet Commonly known as: ZYLOPRIM Take 100 mg by mouth 2 (two) times daily.   CALCIUM PO Take 500 mg by mouth daily.   dextromethorphan 30 MG/5ML liquid Commonly known as: DELSYM Take 60 mg by mouth as needed for cough.   dicyclomine 10 MG capsule Commonly known as: BENTYL Take 1 capsule (10 mg total) by mouth 4 (four) times daily -  before meals and at bedtime.   Eliquis 5 MG Tabs tablet Generic drug: apixaban TAKE 1 TABLET BY MOUTH TWICE A DAY What changed: how much to take   famotidine 20 MG tablet Commonly known as: PEPCID Take 20 mg by mouth at bedtime.   folic acid 1 MG tablet Commonly known as: FOLVITE Take 1 mg by mouth 4 (four) times daily.   furosemide 40 MG tablet Commonly known as: LASIX Take 1 tablet (40 mg total) by mouth 2 (two) times daily.   Gaviscon 95-358 MG/15ML Susp Generic drug: aluminum hydroxide-magnesium carbonate Take 15 mLs by mouth as needed for indigestion or heartburn.   guaiFENesin 600 MG 12 hr tablet Commonly known as: MUCINEX Take 1 tablet (600 mg total) by mouth 2 (two) times daily.   levothyroxine 88 MCG tablet Commonly known as: SYNTHROID Take 88 mcg by mouth daily.   loperamide 2 MG tablet Commonly known as:  IMODIUM A-D Take 2 mg by mouth 4 (four) times daily as needed for diarrhea or loose stools.   losartan 25 MG tablet Commonly known as: COZAAR TAKE 1 TABLET BY MOUTH  DAILY   methotrexate 2.5 MG tablet Commonly known as: RHEUMATREX Take 12.5 mg by mouth every Wednesday.   metoprolol succinate 25 MG 24 hr tablet Commonly known as: TOPROL-XL TAKE 1 AND 1/2 TABLETS BY  MOUTH TWICE DAILY What changed: when to take this   multivitamin tablet Take 1 tablet by mouth daily.   OVER THE COUNTER MEDICATION Place 1 drop into both eyes daily as needed (for dry eyes. Advanced eye relief).   phenylephrine-shark liver oil-mineral oil-petrolatum 0.25-3-14-71.9 % rectal ointment Commonly known as: PREPARATION H Place 1 application rectally as needed for hemorrhoids.   potassium chloride SA 20 MEQ tablet Commonly known as: Klor-Con M20 Take 1 tablet (20 mEq total) by mouth 2 (two) times daily. What changed: when to take this   predniSONE 5 MG tablet Commonly known as: DELTASONE Take 5 mg by mouth daily with breakfast.   rosuvastatin 5 MG tablet Commonly known as: CRESTOR Take 5 mg by mouth every other day.   saccharomyces boulardii 250 MG capsule Commonly known as: FLORASTOR Take 250 mg by mouth 2 (  two) times daily.   sotalol 80 MG tablet Commonly known as: BETAPACE TAKE 1 TABLET BY MOUTH  TWICE DAILY   vitamin B-12 1000 MCG tablet Commonly known as: CYANOCOBALAMIN Take 1,000 mcg by mouth daily.   Vitamin D3 50 MCG (2000 UT) Tabs Take 2,000 Units by mouth daily.       Allergies  Allergen Reactions   Butoconazole Itching, Rash and Other (See Comments)    'Redness and swelling feverish ' 'worse symptoms '   Keflex [Cephalexin] Diarrhea   Lipitor [Atorvastatin Calcium] Other (See Comments)    'Muscle weakness'     Cortisone     This medication causes confusion   Hydrocodone Other (See Comments)    Follow-up Information     [facility] Follow up.    Specialty: General Surgery Contact information: [address] [place] [phone]                 The results of significant diagnostics from this hospitalization (including imaging, microbiology, ancillary and laboratory) are listed below for reference.    Significant Diagnostic Studies: CT Chest Wo Contrast  Result Date: 06/05/2021 CLINICAL DATA:  75 year old female with shortness of breath. EXAM: CT CHEST WITHOUT CONTRAST TECHNIQUE: Multidetector CT imaging of the chest was performed following the standard protocol without IV contrast. COMPARISON:  Portable chest 0231 hours today.  Chest CT 08/13/2015. FINDINGS: Cardiovascular: Advanced calcified coronary artery atherosclerosis and/or stents. Prosthetic mitral valve. Mild cardiomegaly. No pericardial effusion. Mild-to-moderate Calcified aortic atherosclerosis. Mediastinum/Nodes: Superior left chest loop recorder or lead Liss ICD. No mediastinal lymphadenopathy. Lungs/Pleura: Layering retained secretions in the right mainstem bronchus and bronchus intermedius. Other major airways are patent. Bilateral bronchiectasis has progressed since 2017. Left greater than right lower lobe peribronchial nodularity and patchy opacity is superimposed on chronic lower lobe scarring. Upper lung mild tree-in-bud nodular opacity appears more chronic and stable. No consolidation. No pleural effusion. Upper Abdomen: Large lamellated gallstone in the visible gallbladder is 37 mm diameter. No pericholecystic inflammation is evident. Otherwise negative visible noncontrast liver, spleen, pancreas, adrenal glands, kidneys and bowel in the upper abdomen. Musculoskeletal: Chronically augmented T11 and T12 compression fractures appears stable since 2017. Underlying osteopenia. No acute osseous abnormality identified. IMPRESSION: 1. Widespread progressed Bronchiectasis since 2017. Bilateral lower lobe peribronchial  nodularity and patchy opacity is compatible with active infection superimposed on chronic lung disease. Small volume of retained or aspirated secretions in the right mainstem bronchus. Consider both conventional and atypical (MAI) infectious etiology. No pleural effusion. 2. Advanced calcified coronary artery atherosclerosis. Mild cardiomegaly. Prosthetic mitral valve. Aortic Atherosclerosis (ICD10-I70.0). 3. Cholelithiasis. 4. Previously augmented T11 and T12 compression fractures. Electronically Signed   By: Genevie Ann M.D.   On: 06/05/2021 09:58   NM Hepatobiliary Liver Func  Result Date: 06/08/2021 CLINICAL DATA:  Evaluate for cholecystitis.  Gallstones. EXAM: NUCLEAR MEDICINE HEPATOBILIARY IMAGING TECHNIQUE: Sequential images of the abdomen were obtained out to 60 minutes following intravenous administration of radiopharmaceutical. RADIOPHARMACEUTICALS:  5.4 mCi Tc-10m  Choletec IV COMPARISON:  Abdominal sonogram 06/05/2021 . FINDINGS: Prompt uptake and biliary excretion of activity by the liver is seen. Biliary activity passes into small bowel, consistent with patent common bile duct. Within the first hour no gallbladder activity was visualized. Patient subsequently received 3 mg morphine sulfate via IV bolus. Imaging was performed for an additional 30 minutes. Gallbladder activity was promptly visualized after morphine administration. IMPRESSION: 1. Patent cystic duct without evidence for acute cholecystitis. 2.  Delayed filling of the gallbladder, which may be seen in the setting of chronic cholecystitis. Electronically Signed   By: Kerby Moors M.D.   On: 06/08/2021 15:59   US Abdomen Complete  Result Date: 06/05/2021 CLINICAL DATA:  Abdomen pain EXAM: ABDOMEN ULTRASOUND COMPLETE COMPARISON:  Ultrasound 06/14/2016, CT 05/25/2016 FINDINGS: Gallbladder: Shadowing stone. Slight increased wall thickness at 3.9 mm. Positive sonographic Murphy. Common bile duct: Diameter: Dilated up to 11 mm. Liver: No  focal lesion identified. Within normal limits in parenchymal echogenicity. Portal vein is patent on color Doppler imaging with normal direction of blood flow towards the liver. IVC: No abnormality visualized. Pancreas: Visualized portion unremarkable. Spleen: Size and appearance within normal limits. Right Kidney: Length: 8.8 cm. Echogenicity within normal limits. No hydronephrosis. Cortical thinning is present Left Kidney: Length: 9.1 cm. Echogenicity within normal limits. No mass or hydronephrosis. Cortical thinning is present. Abdominal aorta: No aneurysm visualized. Other findings: None. IMPRESSION: 1. Cholelithiasis with slight increased wall thickness and positive sonographic Murphy suspicious for an acute cholecystitis. Dilated common bile duct up to 11 mm, correlate with LFTs with follow-up MRCP as indicated 2. Bilateral renal cortical thinning consistent with atrophy. No hydronephrosis Electronically Signed   By: Donavan Foil M.D.   On: 06/05/2021 19:18   DG Chest Portable 1 View  Result Date: 06/05/2021 CLINICAL DATA:  Shortness of breath. EXAM: PORTABLE CHEST 1 VIEW COMPARISON:  08/28/2020. FINDINGS: The heart is enlarged and the mediastinal contours are within normal limits. There is chronic elevation of the left diaphragm and low lung volumes with mild atelectasis at the lung bases. No consolidation, effusion, or pneumothorax. A loop recorder device is present over the left chest. No acute osseous abnormality. IMPRESSION: Cardiomegaly with no acute process Electronically Signed   By: Brett Fairy M.D.   On: 06/05/2021 02:45   MR ABDOMEN MRCP W WO CONTAST  Result Date: 06/08/2021 CLINICAL DATA:  Concern for cholecystitis and choledocholithiasis. EXAM: MRI ABDOMEN WITHOUT AND WITH CONTRAST (INCLUDING MRCP) TECHNIQUE: Multiplanar multisequence MR imaging of the abdomen was performed both before and after the administration of intravenous contrast. Heavily T2-weighted images of the biliary and  pancreatic ducts were obtained, and three-dimensional MRCP images were rendered by post processing. CONTRAST:  9.79mL GADAVIST GADOBUTROL 1 MMOL/ML IV SOLN COMPARISON:  Ultrasound June 05, 2021 and nuclear medicine HIDA scan June 08, 2021 CT abdomen May 25, 2016 FINDINGS: Lower chest: Heterogeneous signal in the lung bases favor atelectasis. Hepatobiliary: No suspicious hepatic lesion. Mild hepatic steatosis. Mild wall thickening of a distended gallbladder with a gallstone measuring 3.5 cm without other findings of acute cholecystitis. Mild for age prominence of the biliary tree with the common duct measuring 8 mm, with gentle tapering of the duct to the pancreatic parenchyma to the level of the ampulla and no intraluminal filling defect identified. Pancreas: Intrinsic T1 signal of the pancreatic parenchyma is within normal limits. No pancreatic ductal dilation or evidence of acute inflammation. No cystic or solid hyperenhancing pancreatic lesion identified. Spleen:  Within normal limits. Adrenals/Urinary Tract: No hydronephrosis. No suspicious renal mass. Tiny bilateral renal cysts. Bilateral adrenal glands are unremarkable. Stomach/Bowel: Visualized portions within the abdomen are unremarkable. Vascular/Lymphatic: No abdominal aortic aneurysm. No pathologically enlarged abdominal lymph nodes. Other:  No abdominal ascites. Musculoskeletal: Prior lower thoracic vertebral augmentation. No suspicious osseous lesion identified. IMPRESSION: 1. Cholelithiasis with mild wall thickening of a distended gallbladder, which given findings on same day nuclear medicine HIDA scan likely reflect changes of chronic cholecystitis. 2. Mild  for age prominence of the biliary tree with gentle tapering of the duct to the level of the ampulla and no choledocholithiasis identified. 3. Mild hepatic steatosis. Electronically Signed   By: Dahlia Bailiff M.D.   On: 06/08/2021 18:56   ECHOCARDIOGRAM COMPLETE  Result Date:  06/06/2021    ECHOCARDIOGRAM REPORT   Patient Name:   SORIAH LEEMAN Date of Exam: 06/06/2021 Medical Rec #:  [id number]    Height:       56.0 in Accession #:    [id number]   Weight:       212.5 lb Date of Birth:  [birth year]     BSA:          1.822 m Patient Age:    20 years     BP:           142/77 mmHg Patient Gender: F            HR:           67 bpm. Exam Location:  Inpatient Procedure: 2D Echo Indications:    congestive heart failure  History:        Patient has prior history of Echocardiogram examinations, most                 recent 11/02/2020. CHF, CAD, Arrythmias:paroxysmal a-fib; Risk                 Factors:Hypertension.  Sonographer:    Johny Chess RDCS Referring Phys: [id number] RONDELL A SMITH  Sonographer Comments: Image acquisition challenging due to respiratory motion and coughing. IMPRESSIONS  1. Left ventricular ejection fraction, by estimation, is 70 to 75%. The left ventricle has hyperdynamic function. The left ventricle has no regional wall motion abnormalities. There is mild concentric left ventricular hypertrophy. Left ventricular diastolic parameters are consistent with Grade II diastolic dysfunction (pseudonormalization). Elevated left ventricular end-diastolic pressure.  2. Right ventricular systolic function is normal. The right ventricular size is normal. There is moderately elevated pulmonary artery systolic pressure. The estimated right ventricular systolic pressure is 82.4 mmHg.  3. Left atrial size was mild to moderately dilated.  4. The mitral valve is abnormal. Mild to moderate mitral valve regurgitation. Moderate mitral annular calcification.  5. The aortic valve is tricuspid. Aortic valve regurgitation is not visualized.  6. The inferior vena cava is normal in size with greater than 50% respiratory variability, suggesting right atrial pressure of 3 mmHg. Comparison(s): Prior images reviewed side by side. LVEF vigorous with moderate diastolic dysfunction. Mild to moderate mitral  regurgitation and moderately elevated RVSP. FINDINGS  Left Ventricle: Left ventricular ejection fraction, by estimation, is 70 to 75%. The left ventricle has hyperdynamic function. The left ventricle has no regional wall motion abnormalities. The left ventricular internal cavity size was small. There is mild concentric left ventricular hypertrophy. Left ventricular diastolic parameters are consistent with Grade II diastolic dysfunction (pseudonormalization). Elevated left ventricular end-diastolic pressure. Right Ventricle: The right ventricular size is normal. No increase in right ventricular wall thickness. Right ventricular systolic function is normal. There is moderately elevated pulmonary artery systolic pressure. The tricuspid regurgitant velocity is 3.31 m/s, and with an assumed right atrial pressure of 3 mmHg, the estimated right ventricular systolic pressure is 23.5 mmHg. Left Atrium: Left atrial size was mild to moderately dilated. Right Atrium: Right atrial size was normal in size. Pericardium: There is no evidence of pericardial effusion. Presence of epicardial fat layer. Mitral Valve: The mitral valve is abnormal. There is mild thickening of  the mitral valve leaflet(s). There is mild calcification of the mitral valve leaflet(s). Moderate mitral annular calcification. Mild to moderate mitral valve regurgitation. Tricuspid Valve: The tricuspid valve is grossly normal. Tricuspid valve regurgitation is mild. Aortic Valve: The aortic valve is tricuspid. There is mild aortic valve annular calcification. Aortic valve regurgitation is not visualized. Pulmonic Valve: The pulmonic valve was grossly normal. Pulmonic valve regurgitation is trivial. Aorta: The aortic root is normal in size and structure. Venous: The inferior vena cava is normal in size with greater than 50% respiratory variability, suggesting right atrial pressure of 3 mmHg. IAS/Shunts: No atrial level shunt detected by color flow Doppler.  LEFT  VENTRICLE PLAX 2D LVIDd:         4.20 cm   Diastology LVIDs:         2.50 cm   LV e' medial:    6.42 cm/s LV PW:         1.00 cm   LV E/e' medial:  17.8 LV IVS:        1.10 cm   LV e' lateral:   6.64 cm/s LVOT diam:     1.80 cm   LV E/e' lateral: 17.2 LV SV:         55 LV SV Index:   30 LVOT Area:     2.54 cm  RIGHT VENTRICLE             IVC RV S prime:     15.60 cm/s  IVC diam: 1.80 cm TAPSE (M-mode): 2.0 cm LEFT ATRIUM             Index        RIGHT ATRIUM           Index LA diam:        4.30 cm 2.36 cm/m   RA Area:     12.90 cm LA Vol (A2C):   58.6 ml 32.17 ml/m  RA Volume:   29.00 ml  15.92 ml/m LA Vol (A4C):   72.5 ml 39.80 ml/m LA Biplane Vol: 68.9 ml 37.83 ml/m  AORTIC VALVE LVOT Vmax:   104.00 cm/s LVOT Vmean:  68.300 cm/s LVOT VTI:    0.217 m  AORTA Ao Root diam: 2.80 cm Ao Asc diam:  2.80 cm MITRAL VALVE                TRICUSPID VALVE MV Area (PHT): 2.87 cm     TR Peak grad:   43.8 mmHg MV Decel Time: 264 msec     TR Vmax:        331.00 cm/s MV E velocity: 114.00 cm/s MV A velocity: 66.30 cm/s   SHUNTS MV E/A ratio:  1.72         Systemic VTI:  0.22 m                             Systemic Diam: 1.80 cm Rozann Lesches MD Electronically signed by Rozann Lesches MD Signature Date/Time: 06/06/2021/1:07:57 PM    Final    CUP PACEART REMOTE DEVICE CHECK  Result Date: 06/03/2021 ILR summary report received. Battery status OK. Normal device function. No new symptom, tachy, brady, or pause episodes. There were two new AF episodes that were 6 and 8 minutes.  These appear to be SR with ectopy and one episode has a noisy baseline.  Monthly summary reports and ROV/PRN Kathy Breach, RN, CCDS, [employer]   Microbiology:  Recent Results (from the past 240 hour(s))  Resp Panel by RT-PCR (Flu A&B, Covid) Nasopharyngeal Swab     Status: None   Collection Time: 06/05/21  2:39 AM   Specimen: Nasopharyngeal Swab; Nasopharyngeal(NP) swabs in vial transport medium  Result Value Ref Range Status    SARS Coronavirus 2 by RT PCR NEGATIVE NEGATIVE Final    Comment: (NOTE) SARS-CoV-2 target nucleic acids are NOT DETECTED.  The SARS-CoV-2 RNA is generally detectable in upper respiratory specimens during the acute phase of infection. The lowest concentration of SARS-CoV-2 viral copies this assay can detect is 138 copies/mL. A negative result does not preclude SARS-Cov-2 infection and should not be used as the sole basis for treatment or other patient management decisions. A negative result may occur with  improper specimen collection/handling, submission of specimen other than nasopharyngeal swab, presence of viral mutation(s) within the areas targeted by this assay, and inadequate number of viral copies(<138 copies/mL). A negative result must be combined with clinical observations, patient history, and epidemiological information. The expected result is Negative.  Fact Sheet for Patients:  EntrepreneurPulse.com.au  Fact Sheet for Healthcare Providers:  IncredibleEmployment.be  This test is no t yet approved or cleared by the [place] FDA and  has been authorized for detection and/or diagnosis of SARS-CoV-2 by FDA under an Emergency Use Authorization (EUA). This EUA will remain  in effect (meaning this test can be used) for the duration of the COVID-19 declaration under Section 564(b)(1) of the Act, 21 U.S.C.section 360bbb-3(b)(1), unless the authorization is terminated  or revoked sooner.       Influenza A by PCR NEGATIVE NEGATIVE Final   Influenza B by PCR NEGATIVE NEGATIVE Final    Comment: (NOTE) The Xpert Xpress SARS-CoV-2/FLU/RSV plus assay is intended as an aid in the diagnosis of influenza from Nasopharyngeal swab specimens and should not be used as a sole basis for treatment. Nasal washings and aspirates are unacceptable for Xpert Xpress SARS-CoV-2/FLU/RSV testing.  Fact Sheet for  Patients: EntrepreneurPulse.com.au  Fact Sheet for Healthcare Providers: IncredibleEmployment.be  This test is not yet approved or cleared by the [place] FDA and has been authorized for detection and/or diagnosis of SARS-CoV-2 by FDA under an Emergency Use Authorization (EUA). This EUA will remain in effect (meaning this test can be used) for the duration of the COVID-19 declaration under Section 564(b)(1) of the Act, 21 U.S.C. section 360bbb-3(b)(1), unless the authorization is terminated or revoked.  Performed at [facility], [place] [address] [place], [place] [address]   Urine Culture     Status: Abnormal   Collection Time: 06/05/21  9:12 AM   Specimen: Urine, Clean Catch  Result Value Ref Range Status   Specimen Description URINE, CLEAN CATCH  Final   Special Requests NONE  Final   Culture (A)  Final    30,000 COLONIES/mL DIPHTHEROIDS(CORYNEBACTERIUM SPECIES) Standardized susceptibility testing for this organism is not available. Performed at [facility], [place] [address], [place], [place] [address]    Report Status 06/06/2021 FINAL  Final  Surgical pcr screen     Status: None   Collection Time: 06/07/21  9:11 PM   Specimen: Nasal Mucosa; Nasal Swab  Result Value Ref Range Status   MRSA, PCR NEGATIVE NEGATIVE Final   Staphylococcus aureus NEGATIVE NEGATIVE Final    Comment: (NOTE) The Xpert SA Assay (FDA approved for NASAL specimens in patients 55 years of age and older), is one component of a comprehensive surveillance program. It is  not intended to diagnose infection nor to guide or monitor treatment. Performed at [facility], [place] [address], [place], [place] [address]      Labs: Basic Metabolic Panel: Recent Labs  Lab 06/07/21 [phone] 06/08/21 0436 06/09/21 0212 06/10/21 0454 06/11/21 0330 06/11/21 1219 06/11/21 1426  NA 143 145 141 141 139  --  141  K 2.9* 3.5 3.4* 2.8* 3.1* 6.4* 4.5  CL 98 106  102 105 101  --  103  CO2 32 31 28 28 24   --  24  GLUCOSE 98 98 95 84 104*  --  170*  BUN 23 24* 20 19 21   --  25*  CREATININE 1.05* 0.95 0.97 0.85 1.08*  --  1.16*  CALCIUM 8.4* 8.0* 7.5* 7.6* 8.1*  --  8.6*  MG 1.5* 2.4  --   --   --   --   --    Liver Function Tests: Recent Labs  Lab 06/06/21 1448 06/07/21 0733 06/08/21 0436 06/09/21 0212 06/10/21 0454  AST 46* 29 23 28 27   ALT 23 23 19 19 18   ALKPHOS 66 58 56 56 53  BILITOT 1.0 0.5 0.6 0.7 0.7  PROT 7.1 6.4* 6.0* 6.3* 5.8*  ALBUMIN 3.2* 3.0* 2.9* 3.2* 3.0*   No results for input(s): LIPASE, AMYLASE in the last 168 hours. Recent Labs  Lab 06/10/21 0454  AMMONIA 34   CBC: Recent Labs  Lab 06/05/21 0233 06/06/21 0427 06/07/21 0733 06/08/21 0436 06/09/21 0212 06/10/21 0454  WBC 11.4* 9.4 9.4 10.0 10.6* 8.8  NEUTROABS 8.3*  --  6.1 6.1  --   --   HGB 15.1* 14.8 14.2 13.7 14.2 14.3  HCT 46.8* 45.2 42.5 41.2 42.9 43.4  MCV 104.9* 101.6* 100.7* 101.2* 101.7* 101.4*  PLT 171 138* 157 174 175 191   Cardiac Enzymes: No results for input(s): CKTOTAL, CKMB, CKMBINDEX, TROPONINI in the last 168 hours. BNP: BNP (last 3 results) Recent Labs    06/05/21 0233  BNP 303.7*    ProBNP (last 3 results) Recent Labs    03/15/21 1227  PROBNP 822*    CBG: Recent Labs  Lab 06/08/21 0647  GLUCAP 90       Signed:  [name] MD.  [facility] 06/11/2021, 3:32 PM

## 2021-06-11 NOTE — Progress Notes (Signed)
Mobility Specialist Progress Note:   06/11/21 1451  Mobility  Activity Transferred to/from [facility]  Level of Assistance Modified independent, requires aide device or extra time  Assistive Device None  Distance Ambulated (ft) 6 ft  Mobility Ambulated with assistance in room;Out of bed for toileting  Mobility Response Tolerated well  Mobility performed by Mobility specialist  $Mobility charge 1 Mobility   Pt received on BSC, required peri-care. Left in bed with call bell in reach and all needs met.   [facility] Public librarian Phone [phone] Secondary Phone [phone]

## 2021-06-15 ENCOUNTER — Other Ambulatory Visit: Payer: Self-pay

## 2021-06-15 DIAGNOSIS — Z7983 Long term (current) use of bisphosphonates: Secondary | ICD-10-CM | POA: Diagnosis not present

## 2021-06-15 DIAGNOSIS — I480 Paroxysmal atrial fibrillation: Secondary | ICD-10-CM | POA: Diagnosis not present

## 2021-06-15 DIAGNOSIS — Z7901 Long term (current) use of anticoagulants: Secondary | ICD-10-CM | POA: Diagnosis not present

## 2021-06-15 DIAGNOSIS — E039 Hypothyroidism, unspecified: Secondary | ICD-10-CM | POA: Diagnosis not present

## 2021-06-15 DIAGNOSIS — I088 Other rheumatic multiple valve diseases: Secondary | ICD-10-CM | POA: Diagnosis not present

## 2021-06-15 DIAGNOSIS — I700 Atherosclerosis of aorta: Secondary | ICD-10-CM | POA: Diagnosis not present

## 2021-06-15 DIAGNOSIS — I110 Hypertensive heart disease with heart failure: Secondary | ICD-10-CM | POA: Diagnosis not present

## 2021-06-15 DIAGNOSIS — K8000 Calculus of gallbladder with acute cholecystitis without obstruction: Secondary | ICD-10-CM | POA: Diagnosis not present

## 2021-06-15 DIAGNOSIS — J470 Bronchiectasis with acute lower respiratory infection: Secondary | ICD-10-CM | POA: Diagnosis not present

## 2021-06-15 DIAGNOSIS — I2510 Atherosclerotic heart disease of native coronary artery without angina pectoris: Secondary | ICD-10-CM | POA: Diagnosis not present

## 2021-06-15 DIAGNOSIS — Z7952 Long term (current) use of systemic steroids: Secondary | ICD-10-CM | POA: Diagnosis not present

## 2021-06-15 DIAGNOSIS — Z9181 History of falling: Secondary | ICD-10-CM | POA: Diagnosis not present

## 2021-06-15 DIAGNOSIS — E785 Hyperlipidemia, unspecified: Secondary | ICD-10-CM | POA: Diagnosis not present

## 2021-06-15 DIAGNOSIS — I5032 Chronic diastolic (congestive) heart failure: Secondary | ICD-10-CM | POA: Diagnosis not present

## 2021-06-15 DIAGNOSIS — J189 Pneumonia, unspecified organism: Secondary | ICD-10-CM

## 2021-06-15 NOTE — Patient Outreach (Signed)
[facility]) Care Management  06/15/2021  [name] 04-Dec-1945 [id number]   Hospital referral received from Natividad Brood for [place] for complex care and disease management. Assigned to Joellyn Quails, RN Care Coordinator.   Thank you, [place] Care Management Assistant

## 2021-06-15 NOTE — Progress Notes (Signed)
[facility] Summary Report / Loop Recorder 

## 2021-06-16 DIAGNOSIS — K8000 Calculus of gallbladder with acute cholecystitis without obstruction: Secondary | ICD-10-CM | POA: Diagnosis not present

## 2021-06-16 DIAGNOSIS — Z7901 Long term (current) use of anticoagulants: Secondary | ICD-10-CM | POA: Diagnosis not present

## 2021-06-16 DIAGNOSIS — I088 Other rheumatic multiple valve diseases: Secondary | ICD-10-CM | POA: Diagnosis not present

## 2021-06-16 DIAGNOSIS — E039 Hypothyroidism, unspecified: Secondary | ICD-10-CM | POA: Diagnosis not present

## 2021-06-16 DIAGNOSIS — I110 Hypertensive heart disease with heart failure: Secondary | ICD-10-CM | POA: Diagnosis not present

## 2021-06-16 DIAGNOSIS — Z7983 Long term (current) use of bisphosphonates: Secondary | ICD-10-CM | POA: Diagnosis not present

## 2021-06-16 DIAGNOSIS — Z7952 Long term (current) use of systemic steroids: Secondary | ICD-10-CM | POA: Diagnosis not present

## 2021-06-16 DIAGNOSIS — Z9181 History of falling: Secondary | ICD-10-CM | POA: Diagnosis not present

## 2021-06-16 DIAGNOSIS — I700 Atherosclerosis of aorta: Secondary | ICD-10-CM | POA: Diagnosis not present

## 2021-06-16 DIAGNOSIS — I480 Paroxysmal atrial fibrillation: Secondary | ICD-10-CM | POA: Diagnosis not present

## 2021-06-16 DIAGNOSIS — I2510 Atherosclerotic heart disease of native coronary artery without angina pectoris: Secondary | ICD-10-CM | POA: Diagnosis not present

## 2021-06-16 DIAGNOSIS — J470 Bronchiectasis with acute lower respiratory infection: Secondary | ICD-10-CM | POA: Diagnosis not present

## 2021-06-16 DIAGNOSIS — E785 Hyperlipidemia, unspecified: Secondary | ICD-10-CM | POA: Diagnosis not present

## 2021-06-16 DIAGNOSIS — I5032 Chronic diastolic (congestive) heart failure: Secondary | ICD-10-CM | POA: Diagnosis not present

## 2021-06-17 DIAGNOSIS — E039 Hypothyroidism, unspecified: Secondary | ICD-10-CM | POA: Diagnosis not present

## 2021-06-17 DIAGNOSIS — I2510 Atherosclerotic heart disease of native coronary artery without angina pectoris: Secondary | ICD-10-CM | POA: Diagnosis not present

## 2021-06-17 DIAGNOSIS — E785 Hyperlipidemia, unspecified: Secondary | ICD-10-CM | POA: Diagnosis not present

## 2021-06-17 DIAGNOSIS — Z9181 History of falling: Secondary | ICD-10-CM | POA: Diagnosis not present

## 2021-06-17 DIAGNOSIS — I5032 Chronic diastolic (congestive) heart failure: Secondary | ICD-10-CM | POA: Diagnosis not present

## 2021-06-17 DIAGNOSIS — Z7901 Long term (current) use of anticoagulants: Secondary | ICD-10-CM | POA: Diagnosis not present

## 2021-06-17 DIAGNOSIS — I700 Atherosclerosis of aorta: Secondary | ICD-10-CM | POA: Diagnosis not present

## 2021-06-17 DIAGNOSIS — Z7952 Long term (current) use of systemic steroids: Secondary | ICD-10-CM | POA: Diagnosis not present

## 2021-06-17 DIAGNOSIS — I110 Hypertensive heart disease with heart failure: Secondary | ICD-10-CM | POA: Diagnosis not present

## 2021-06-17 DIAGNOSIS — K8000 Calculus of gallbladder with acute cholecystitis without obstruction: Secondary | ICD-10-CM | POA: Diagnosis not present

## 2021-06-17 DIAGNOSIS — J470 Bronchiectasis with acute lower respiratory infection: Secondary | ICD-10-CM | POA: Diagnosis not present

## 2021-06-17 DIAGNOSIS — I088 Other rheumatic multiple valve diseases: Secondary | ICD-10-CM | POA: Diagnosis not present

## 2021-06-17 DIAGNOSIS — Z7983 Long term (current) use of bisphosphonates: Secondary | ICD-10-CM | POA: Diagnosis not present

## 2021-06-17 DIAGNOSIS — I480 Paroxysmal atrial fibrillation: Secondary | ICD-10-CM | POA: Diagnosis not present

## 2021-06-18 ENCOUNTER — Ambulatory Visit: Payer: Medicare Other | Admitting: Student

## 2021-06-18 ENCOUNTER — Encounter: Payer: Self-pay | Admitting: *Deleted

## 2021-06-18 ENCOUNTER — Other Ambulatory Visit: Payer: Self-pay

## 2021-06-18 ENCOUNTER — Encounter: Payer: Self-pay | Admitting: Student

## 2021-06-18 ENCOUNTER — Other Ambulatory Visit: Payer: Self-pay | Admitting: *Deleted

## 2021-06-18 VITALS — BP 132/74 | HR 67 | Ht <= 58 in | Wt 204.5 lb

## 2021-06-18 DIAGNOSIS — I5032 Chronic diastolic (congestive) heart failure: Secondary | ICD-10-CM

## 2021-06-18 DIAGNOSIS — G4733 Obstructive sleep apnea (adult) (pediatric): Secondary | ICD-10-CM

## 2021-06-18 DIAGNOSIS — I10 Essential (primary) hypertension: Secondary | ICD-10-CM | POA: Diagnosis not present

## 2021-06-18 DIAGNOSIS — I480 Paroxysmal atrial fibrillation: Secondary | ICD-10-CM | POA: Diagnosis not present

## 2021-06-18 DIAGNOSIS — I2510 Atherosclerotic heart disease of native coronary artery without angina pectoris: Secondary | ICD-10-CM

## 2021-06-18 LAB — CUP PACEART INCLINIC DEVICE CHECK
Date Time Interrogation Session: 20230106122012
Implantable Pulse Generator Implant Date: 20200710

## 2021-06-18 NOTE — Patient Outreach (Signed)
[facility]) Care Management Telephonic RN Care Manager Note   06/18/2021 Name:  DIM MEISINGER MRN:  [id number] DOB:  1946/05/07  Summary: [facility] Unsuccessful outreach   Outreach attempt to the listed at the preferred outreach number in EPIC [phone] No answer. [facility] RN CM left HIPAA [facility] Portability and Accountability Act) compliant voicemail message along with CMs contact info.   Plan: [facility] RN CM scheduled this patient for another call attempt within 4-7 business days  Orva Riles L. Lavina Hamman, RN, BSN, [place] Coordinator Office number [phone] Mobile number [phone]  Main [facility] number [phone] Fax number [phone]

## 2021-06-18 NOTE — Progress Notes (Signed)
Electrophysiology Office Note Date: 06/18/2021  ID:  [name], DOB 01-07-1946, MRN [id number]  PCP: [name], DO Primary Cardiologist: [name], MD Electrophysiologist: [name], MD   CC: ILR follow-up  ALDONIA [name] is a 76 y.o. female seen today for Dr. Rayann Heman . she presents today for post hospital follow up.   Admitted 12/24 - 12/30 and treated for CAP, acute hypoxic respiratory failure, and cholelithiasis. Eliquis held for MRCP and resumed without incident.   Since being discharged from the hospital, pt is making gradual recovery. Has HHRN/PT/OT and progressing nicely. Still some SOB with moderate activity. No cough, fever, or chills. Has follow up with PCP in place. Hasn't noticed any more AF. No syncope, palpitations, or chest pain.   Device History: Medtronic loop recorder implanted 12/2018 for paroxysmal atrial fibrillation  Past Medical History:  Diagnosis Date   Anemia    Bilateral leg edema    C. difficile enteritis    CAD (coronary artery disease)    a. LHC 03/2006: EF 65%, mid LAD 40-50%, ostial D1 70-80%, normal circumflex, normal RCA;  b. Lexiscan Myoview 09/2010: No ischemia or scar, EF 75%;  c. Lex MV 5/14:  Normal, no ischemia, EF 71% // Myoview 08/2018: EF 43, normal perfusion; Intermediate Risk due to low EF (Echo 07/2018 with normal EF)    Cataract    bilateral-had surgery   Cellulitis of right leg    Hx - resolved   Chronic diastolic CHF (congestive heart failure) ([place])    Echo 8/14:  Mild LVH, EF 55-65%, normal wall motion, Tr AI, mild MR, mod TR, PASP 45 // 12/2014 Echo: EF 60-65%, nl wall motion, mildly dil RA/LA, PASP 71mHg.// Echo 07/2018: EF 60-65, mild LVH, mod diastolic dysfunction, normal RVSF, severe LAE, mod MAC, mild MR, mild TR    Continuous urine leakage    Dermatomyositis (HCC)    Deviated septum    Essential hypertension    Family history of adverse reaction to anesthesia    .' MY SON IS A DIFFICULT INTUBATION"   Gall stones     a. 08/2014 Abd U/S:  Large gallstone measuring 4.2 cm.    GERD (gastroesophageal reflux disease)    Gout    Hiatal hernia    History of pancreatitis    History of PFTs    a. PFTs 10/2012: normal.    Hyperlipidemia    Hypothyroidism    Internal hemorrhoids    Morbid obesity ([place]    Multifocal atrial tachycardia ([place] 08/10/2018   Noted during admission in 07/2018   Osteoarthritis    spine hips knees   PAF (paroxysmal atrial fibrillation) ([place]    a. CHA2DS2VASc = 5-->poor OAC candidate 2/2 falls. No problems now per patient 03/2016   Pneumonia    Seizures ([place]    as a baby   Shingles    Hx   Sleep apnea    Does not use CPAP   Spinal cord compression ([place] 02/21/2014   Past Surgical History:  Procedure Laterality Date   CATARACT EXTRACTION, BILATERAL Bilateral    COLONOSCOPY  03/2011   Implantable loop recorder placement  12/21/2018   Medtronic Reveal LCoolmodel LNQ11 (SN [id number]) implanted by Dr ARayann Hemanin office for AF management   NASAL SEPTUM SURGERY     TOTAL ABDOMINAL HYSTERECTOMY     VERTEBROPLASTY  Sept 7 and May 30, 2014   x 2, T11/T12   WISDOM TOOTH EXTRACTION      Current  Outpatient Medications  Medication Sig Dispense Refill   acetaminophen (TYLENOL) 500 MG tablet Take 500-1,000 mg by mouth every 6 (six) hours as needed for mild pain or fever.      albuterol (VENTOLIN HFA) 108 (90 Base) MCG/ACT inhaler      alendronate (FOSAMAX) 70 MG tablet Take 1 tablet by mouth every Tuesday.      allopurinol (ZYLOPRIM) 100 MG tablet Take 100 mg by mouth 2 (two) times daily.      aluminum hydroxide-magnesium carbonate (GAVISCON) 95-358 MG/15ML SUSP Take 15 mLs by mouth as needed for indigestion or heartburn.     CALCIUM PO Take 500 mg by mouth daily.     Cholecalciferol (VITAMIN D3) 2000 UNITS TABS Take 2,000 Units by mouth daily.     dextromethorphan (DELSYM) 30 MG/5ML liquid Take 60 mg by mouth as needed for cough.     dicyclomine (BENTYL) 10 MG capsule Take 1 capsule  (10 mg total) by mouth 4 (four) times daily -  before meals and at bedtime. 360 capsule 3   ELIQUIS 5 MG TABS tablet TAKE 1 TABLET BY MOUTH TWICE A DAY (Patient taking differently: Take 5 mg by mouth 2 (two) times daily.) 60 tablet 5   famotidine (PEPCID) 20 MG tablet Take 20 mg by mouth at bedtime.     folic acid (FOLVITE) 1 MG tablet Take 1 mg by mouth 4 (four) times daily.     furosemide (LASIX) 40 MG tablet Take 1 tablet (40 mg total) by mouth 2 (two) times daily. 120 tablet 5   guaiFENesin (MUCINEX) 600 MG 12 hr tablet Take 1 tablet (600 mg total) by mouth 2 (two) times daily. 30 tablet 0   levothyroxine (SYNTHROID) 88 MCG tablet Take 88 mcg by mouth daily.      loperamide (IMODIUM A-D) 2 MG tablet Take 2 mg by mouth 4 (four) times daily as needed for diarrhea or loose stools.     losartan (COZAAR) 25 MG tablet TAKE 1 TABLET BY MOUTH  DAILY (Patient taking differently: Take 25 mg by mouth daily.) 90 tablet 3   methotrexate (RHEUMATREX) 2.5 MG tablet Take 12.5 mg by mouth every Wednesday.   3   metoprolol succinate (TOPROL-XL) 25 MG 24 hr tablet TAKE 1 AND 1/2 TABLETS BY  MOUTH TWICE DAILY (Patient taking differently: Take 37.5 mg by mouth in the morning and at bedtime.) 270 tablet 3   Multiple Vitamin (MULTIVITAMIN) tablet Take 1 tablet by mouth daily.     OVER THE COUNTER MEDICATION Place 1 drop into both eyes daily as needed (for dry eyes. Advanced eye relief).     phenylephrine-shark liver oil-mineral oil-petrolatum (PREPARATION H) 0.25-3-14-71.9 % rectal ointment Place 1 application rectally as needed for hemorrhoids.     potassium chloride SA (KLOR-CON M20) 20 MEQ tablet Take 1 tablet (20 mEq total) by mouth 2 (two) times daily. 60 tablet 11   predniSONE (DELTASONE) 5 MG tablet Take 5 mg by mouth daily with breakfast.     rosuvastatin (CRESTOR) 5 MG tablet Take 5 mg by mouth every other day.      saccharomyces boulardii (FLORASTOR) 250 MG capsule Take 250 mg by mouth 2 (two) times daily.      sotalol (BETAPACE) 80 MG tablet TAKE 1 TABLET BY MOUTH  TWICE DAILY (Patient taking differently: Take 80 mg by mouth 2 (two) times daily.) 180 tablet 3   vitamin B-12 (CYANOCOBALAMIN) 1000 MCG tablet Take 1,000 mcg by mouth daily.  No current facility-administered medications for this visit.    Allergies:   Butoconazole, Keflex [cephalexin], Lipitor [atorvastatin calcium], Cortisone, and Hydrocodone   Social History: Social History   Socioeconomic History   Marital status: Married    Spouse name: Not on file   Number of children: 2   Years of education: Not on file   Highest education level: Not on file  Occupational History    Employer: UNEMPLOYED  Tobacco Use   Smoking status: Never   Smokeless tobacco: Never  Vaping Use   Vaping Use: Never used  Substance and Sexual Activity   Alcohol use: No   Drug use: No   Sexual activity: Not Currently    Birth control/protection: Post-menopausal  Other Topics Concern   Not on file  Social History Narrative   Not on file   Social Determinants of Health   Financial Resource Strain: Not on file  Food Insecurity: Not on file  Transportation Needs: Not on file  Physical Activity: Not on file  Stress: Not on file  Social Connections: Not on file  Intimate Partner Violence: Not on file    Family History: Family History  Problem Relation Age of Onset   Colon cancer Mother        mets, spot on lungs and spine   Coronary artery disease Father    Emphysema Father    Heart attack Brother        x 2   Colon cancer Paternal Uncle    Kidney disease Brother    Hypertension Brother    Stroke Neg Hx    Esophageal cancer Neg Hx      Review of Systems: All other systems reviewed and are otherwise negative except as noted above.  Physical Exam: Vitals:   06/18/21 1143  BP: 132/74  Pulse: 67  SpO2: 97%  Weight: 204 lb 8 oz (92.8 kg)  Height: _0  (1.422 m)     GEN- The patient is well appearing, alert and oriented x  3 today.   HEENT: normocephalic, atraumatic; sclera clear, conjunctiva pink; hearing intact; oropharynx clear; neck supple  Lungs- Clear to ausculation bilaterally, normal work of breathing.  No wheezes, rales, rhonchi Heart- Regular rate and rhythm, no murmurs, rubs or gallops  GI- soft, non-tender, non-distended, bowel sounds present  Extremities- no clubbing, cyanosis, or edema  MS- no significant deformity or atrophy Skin- warm and dry, no rash or lesion; PPM pocket well healed Psych- euthymic mood, full affect Neuro- strength and sensation are intact  PPM Interrogation- reviewed in detail today,  See PACEART report  EKG:  EKG is not ordered today. The ekg ordered 06/08/2021 showed NSR at 77 bpm with stable QTc  Recent Labs: 03/15/2021: NT-Pro BNP 822 06/05/2021: B Natriuretic Peptide 303.7; TSH 0.424 06/08/2021: Magnesium 2.4 06/10/2021: ALT 18; Hemoglobin 14.3; Platelets 191 06/11/2021: BUN 25; Creatinine, Ser 1.16; Potassium 4.5; Sodium 141   Wt Readings from Last 3 Encounters:  06/18/21 204 lb 8 oz (92.8 kg)  06/11/21 207 lb 7.3 oz (94.1 kg)  03/15/21 227 lb 12.8 oz (103.3 kg)     Other studies Reviewed: Additional studies/ records that were reviewed today include: Echo 05/2021 shows LVEF 70-75%, Previous EP office notes, Previous remote checks, Most recent labwork.   Assessment and Plan:  1.  Paroxysmal atrial fibrillation  s/p Medtronic Loop recorder Normal device function See Pace Art report No changes today AF burden < 1%. Continue sotalol K was labile in hospital. BMET today.  2. CAP Resolved. Has HHRN/PT/OT in place for recovery Using IS regularly.   3. OSA Refuses CPAP  3. HTN Stable on current regimen   4. Morbid obesity Body mass index is 45.85 kg/m.   5. CAD No s/s ischemia  6. Chronic diastolic CHF Volume status stable today.   Current medicines are reviewed at length with the patient today.    Labs/ tests ordered today include:   Orders Placed This Encounter  Procedures   Basic metabolic panel   Magnesium   CUP PACEART INCLINIC DEVICE CHECK     Disposition:   Follow up with EP APP in 3 Months    Signed, Shirley Friar, PA-C  06/18/2021 11:57 AM  [facility] [address] [place] [address] [phone] (office) [phone] (fax)

## 2021-06-18 NOTE — Patient Instructions (Signed)
Medication Instructions:  Your physician recommends that you continue on your current medications as directed. Please refer to the Current Medication list given to you today.  *If you need a refill on your cardiac medications before your next appointment, please call your pharmacy*   Lab Work: TODAY: BMET, Mag  If you have labs (blood work) drawn today and your tests are completely normal, you will receive your results only by: [place] (if you have MyChart) OR A paper copy in the mail If you have any lab test that is abnormal or we need to change your treatment, we will call you to review the results.   Follow-Up: At [facility], you and your health needs are our priority.  As part of our continuing mission to provide you with exceptional heart care, we have created designated Provider Care Teams.  These Care Teams include your primary Cardiologist (physician) and Advanced Practice Providers (APPs -  Physician Assistants and Nurse Practitioners) who all work together to provide you with the care you need, when you need it.  Your next appointment:   3 month(s)  The format for your next appointment:   In Person  Provider:   You will see one of the following Advanced Practice Providers on your designated Care Team:   Tommye Standard, [place] "[facility]" [place], [place]

## 2021-06-18 NOTE — Patient Outreach (Signed)
[facility]) Care Management Telephonic RN Care Manager Note   06/18/2021 Name:  [name] MRN:  [id number] DOB:  February 07, 1946  Summary: Initial post hospital outreach for screening [name] answers on the home number after a message left on the mobile number He reports [name] is doing better He provides an updated of completed appointments to cardiology and home therapies to include PT & OT visits He reports she has had some voiced pain in her back, stomach but no emesis Sputum is moderate, productive but not discolored All DME is in the home [name] Hirschmann reports he is the only and primary caregiver He denies falls since discharge but reports a history of fall He plans to assist her with a shower this weekend      Subjective: [name] is an 76 y.o. year old female who is a primary patient of [name], DO. The care management team was consulted for assistance with care management and/or care coordination needs.    Telephonic RN Care Manager completed Telephone Visit today.   Objective:  Medications Reviewed Today     Reviewed by Barbaraann Faster, RN (Registered Nurse) on 06/18/21 at 2003  Med List Status: <None>   Medication Order Taking? Sig Documenting Provider Last Dose Status Informant  acetaminophen (TYLENOL) 500 MG tablet [id number] Yes Take 500-1,000 mg by mouth every 6 (six) hours as needed for mild pain or fever.  [provider] Taking Active Self  albuterol (VENTOLIN HFA) 108 (90 Base) MCG/ACT inhaler [id number]   [provider]  Active Self  alendronate (FOSAMAX) 70 MG tablet [id number]  Take 1 tablet by mouth every Tuesday.  [provider]  Active Self           Med Note [name], AMANDA R   Thu Aug 13, 2015 11:18 AM)    allopurinol (ZYLOPRIM) 100 MG tablet [id number]  Take 100 mg by mouth 2 (two) times daily.  [provider]  Active Self           Med Note [name], MARIAN A   Mon Jul 18, 2016  2:00 PM)     aluminum hydroxide-magnesium carbonate (GAVISCON) 95-358 MG/15ML SUSP [id number] Yes Take 15 mLs by mouth as needed for indigestion or heartburn. [provider] Taking Active Self  CALCIUM PO [id number]  Take 500 mg by mouth daily. [provider]  Active Self  Cholecalciferol (VITAMIN D3) 2000 UNITS TABS [id number]  Take 2,000 Units by mouth daily. [provider]  Active Self  dextromethorphan (DELSYM) 30 MG/5ML liquid [id number]  Take 60 mg by mouth as needed for cough. [provider]  Active Self  dicyclomine (BENTYL) 10 MG capsule [id number]  Take 1 capsule (10 mg total) by mouth 4 (four) times daily -  before meals and at bedtime. [name], MD  Active Self  ELIQUIS 5 MG TABS tablet [id number] Yes TAKE 1 TABLET BY MOUTH TWICE A DAY  Patient taking differently: Take 5 mg by mouth 2 (two) times daily.   [name], MD Taking Active Self  famotidine (PEPCID) 20 MG tablet [id number]  Take 20 mg by mouth at bedtime. [provider]  Active Self  folic acid (FOLVITE) 1 MG tablet [id number]  Take 1 mg by mouth 4 (four) times daily. [provider]  Active Self  furosemide (LASIX) 40 MG tablet [id number]  Take 1 tablet (40 mg total) by mouth 2 (two) times daily. [name], [name], MD  Active  Self  guaiFENesin (MUCINEX) 600 MG 12 hr tablet [id number]  Take 1 tablet (600 mg total) by mouth 2 (two) times daily. [name], MD  Active   levothyroxine (SYNTHROID) 88 MCG tablet [id number]  Take 88 mcg by mouth daily.  [provider]  Active Self  loperamide (IMODIUM A-D) 2 MG tablet [id number]  Take 2 mg by mouth 4 (four) times daily as needed for diarrhea or loose stools. [provider]  Active Self  losartan (COZAAR) 25 MG tablet [id number]  TAKE 1 TABLET BY MOUTH  DAILY  Patient taking differently: Take 25 mg by mouth daily.   [name], MD  Active Self  methotrexate (RHEUMATREX) 2.5 MG tablet [id number]  Take 12.5 mg by  mouth every Wednesday.  [provider]  Active Self           Med Note [name], MICHELE   Wed Sep 30, 2020  9:53 AM)    metoprolol succinate (TOPROL-XL) 25 MG 24 hr tablet [id number]  TAKE 1 AND 1/2 TABLETS BY  MOUTH TWICE DAILY  Patient taking differently: Take 37.5 mg by mouth in the morning and at bedtime.   [name], MD  Active Self  Multiple Vitamin (MULTIVITAMIN) tablet [id number]  Take 1 tablet by mouth daily. [provider]  Active Self  OVER THE COUNTER MEDICATION [id number]  Place 1 drop into both eyes daily as needed (for dry eyes. Advanced eye relief). [provider]  Active Self  phenylephrine-shark liver oil-mineral oil-petrolatum (PREPARATION H) 0.25-3-14-71.9 % rectal ointment [id number]  Place 1 application rectally as needed for hemorrhoids. [provider]  Active Self  potassium chloride SA (KLOR-CON M20) 20 MEQ tablet [id number]  Take 1 tablet (20 mEq total) by mouth 2 (two) times daily. [name], MD  Active   predniSONE (DELTASONE) 5 MG tablet [id number]  Take 5 mg by mouth daily with breakfast. [provider]  Active Self  rosuvastatin (CRESTOR) 5 MG tablet [id number]  Take 5 mg by mouth every other day.  [provider]  Active Self           Med Note [name], AMANDA D   Wed Dec 12, 2018 11:34 AM)    saccharomyces boulardii (FLORASTOR) 250 MG capsule [id number]  Take 250 mg by mouth 2 (two) times daily. [provider]  Active Self  sotalol (BETAPACE) 80 MG tablet [id number]  TAKE 1 TABLET BY MOUTH  TWICE DAILY  Patient taking differently: Take 80 mg by mouth 2 (two) times daily.   [name], MD  Active Self  vitamin B-12 (CYANOCOBALAMIN) 1000 MCG tablet [id number]  Take 1,000 mcg by mouth daily. [provider]  Active Self           Patient Active Problem List   Diagnosis Date Noted   CAP (community acquired pneumonia) 06/05/2021   Acute respiratory failure with hypoxia ([place])  06/05/2021   Elevated troponin 06/05/2021   Hypothyroidism 06/05/2021   Leukocytosis 06/05/2021   Secondary hypercoagulable state ([place]) 06/24/2019   Multifocal atrial tachycardia ([place]) 08/10/2018   Chest pain    Cellulitis, bilateral LE but R>L, abd wall  08/13/2015   Sepsis due to cellulitis ([place]) 08/13/2015   Acute kidney injury ([place]) 08/13/2015   Hypokalemia 08/13/2015   History of iron deficiency anemia 08/13/2015   Thrombocytopenia ([place]) 08/13/2015   Sepsis ([place]) 08/13/2015   Chronic diastolic CHF (congestive heart failure) (H[facility])    PAF (paroxysmal atrial fibrillation) ([place])    Essential  hypertension    Dermatomyositis ([place]) 02/18/2014   Morbid obesity with BMI of 50.0-59.9, adult ([place]) 12/09/2013   CAD (coronary artery disease) 09/25/2008   LEG EDEMA, BILATERAL 09/20/2008     SDOH:  (Social Determinants of Health) assessments and interventions performed:  SDOH Interventions    Flowsheet Row Most Recent Value  SDOH Interventions   Food Insecurity Interventions Intervention Not Indicated  Financial Strain Interventions Intervention Not Indicated  Housing Interventions Intervention Not Indicated  Intimate Partner Violence Interventions Intervention Not Indicated  Stress Interventions Intervention Not Indicated  Transportation Interventions Intervention Not Indicated       Care Plan  Review of patient past medical history, allergies, medications, health status, including review of consultants reports, laboratory and other test data, was performed as part of comprehensive evaluation for care management services.   Care Plan : RN Care Manager Plan of Care  Updates made by Barbaraann Faster, RN since 06/18/2021 12:00 AM     Problem: Complex Care Coordination Needs and disease management in patient with PNA, CHF, HTN   Priority: High  Onset Date: 06/18/2021     Long-Range Goal: Establish Plan of Care for Management Complex SDOH Barriers, disease management and Care  Coordination Needs in patient with PNA CHF, HTN   Start Date: 06/18/2021  This Visit's Progress: On track  Priority: High  Note:   Current Barriers:  Knowledge Deficits related to plan of care for management of CHF, HTN, and PNA  Care Coordination needs related to Limited social support and Limited education about CHF, HTN, PNA*  RN CM Clinical Goal(s):  Patient will verbalize understanding of plan for management of CHF, HTN, and PNA as evidenced by no admission in the next 30 days  through collaboration with RN Care manager, provider, and care team.   Interventions: Further outreaches to screen for care coordination and disease management needs Inter-disciplinary care team collaboration (see longitudinal plan of care) Evaluation of current treatment plan related to  self management and patient's adherence to plan as established by provider  Recommendations/Changes made from today's visit: RN CM recommended spacing out pills to prevent/decrease stomach pain with small frequent meals, hydration He is presently rubbing Voltaren cream on her back. RN suggested heat and he will attempt this  He was offered support, commended  He was provided the numbers for RN CM and the 24 hour nurse call center numbers   Heart Failure Interventions:  (Status:  New goal.) Long Term Goal Assessed social determinant of health barriers  Assessed for worsening symptoms  Pneumonia (PNA)  (Status:  New goal.)  Short Term Goal Evaluation of current treatment plan related to  PNA , Limited education about home care for post PNA* self-management and patient's adherence to plan as established by provider. Discussed plans with patient for ongoing care management follow up and provided patient with direct contact information for care management team Evaluation of current treatment plan related to PNA and patient's adherence to plan as established by provider  Hypertension Interventions:  (Status:  New goal.) Short  Term Goal Last practice recorded BP readings:  BP Readings from Last 3 Encounters:  06/18/21 132/74  06/11/21 (!) 156/76  03/15/21 112/90  Most recent eGFR/CrCl:  Lab Results  Component Value Date   EGFR 73 03/15/2021    No components found for: CRCL  Evaluation of current treatment plan related to hypertension self management and patient's adherence to plan as established by provider  Patient Goals/Self-Care Activities: Take all medications as prescribed  Attend all scheduled provider appointments Call provider office for new concerns or questions  watch for swelling in feet, ankles and legs every day take medications for blood pressure exactly as prescribed  Follow Up Plan:  The patient has been provided with contact information for the care management team and has been advised to call with any health related questions or concerns.  The care management team will reach out to the patient again over the next 7-14 business days.       Plan: The patient has been provided with contact information for the care management team and has been advised to call with any health related questions or concerns.  The care management team will reach out to the patient again over the next 7-14 business days.   L. Lavina Hamman, RN, BSN, [place] Coordinator Office number [phone] [facility] number [phone] Fax number [phone]

## 2021-06-22 ENCOUNTER — Telehealth: Payer: Self-pay

## 2021-06-22 DIAGNOSIS — Z7983 Long term (current) use of bisphosphonates: Secondary | ICD-10-CM | POA: Diagnosis not present

## 2021-06-22 DIAGNOSIS — I5032 Chronic diastolic (congestive) heart failure: Secondary | ICD-10-CM | POA: Diagnosis not present

## 2021-06-22 DIAGNOSIS — Z7952 Long term (current) use of systemic steroids: Secondary | ICD-10-CM | POA: Diagnosis not present

## 2021-06-22 DIAGNOSIS — I480 Paroxysmal atrial fibrillation: Secondary | ICD-10-CM | POA: Diagnosis not present

## 2021-06-22 DIAGNOSIS — I2510 Atherosclerotic heart disease of native coronary artery without angina pectoris: Secondary | ICD-10-CM | POA: Diagnosis not present

## 2021-06-22 DIAGNOSIS — I088 Other rheumatic multiple valve diseases: Secondary | ICD-10-CM | POA: Diagnosis not present

## 2021-06-22 DIAGNOSIS — I700 Atherosclerosis of aorta: Secondary | ICD-10-CM | POA: Diagnosis not present

## 2021-06-22 DIAGNOSIS — E785 Hyperlipidemia, unspecified: Secondary | ICD-10-CM | POA: Diagnosis not present

## 2021-06-22 DIAGNOSIS — K8000 Calculus of gallbladder with acute cholecystitis without obstruction: Secondary | ICD-10-CM | POA: Diagnosis not present

## 2021-06-22 DIAGNOSIS — Z9181 History of falling: Secondary | ICD-10-CM | POA: Diagnosis not present

## 2021-06-22 DIAGNOSIS — I110 Hypertensive heart disease with heart failure: Secondary | ICD-10-CM | POA: Diagnosis not present

## 2021-06-22 DIAGNOSIS — J470 Bronchiectasis with acute lower respiratory infection: Secondary | ICD-10-CM | POA: Diagnosis not present

## 2021-06-22 DIAGNOSIS — E039 Hypothyroidism, unspecified: Secondary | ICD-10-CM | POA: Diagnosis not present

## 2021-06-22 DIAGNOSIS — Z7901 Long term (current) use of anticoagulants: Secondary | ICD-10-CM | POA: Diagnosis not present

## 2021-06-22 NOTE — Telephone Encounter (Signed)
The patient called to let us know that she was in the hospital 12/24-12/30/2022.

## 2021-06-23 ENCOUNTER — Other Ambulatory Visit: Payer: Self-pay

## 2021-06-23 DIAGNOSIS — I480 Paroxysmal atrial fibrillation: Secondary | ICD-10-CM

## 2021-06-23 DIAGNOSIS — E039 Hypothyroidism, unspecified: Secondary | ICD-10-CM | POA: Diagnosis not present

## 2021-06-23 DIAGNOSIS — I110 Hypertensive heart disease with heart failure: Secondary | ICD-10-CM | POA: Diagnosis not present

## 2021-06-23 DIAGNOSIS — E785 Hyperlipidemia, unspecified: Secondary | ICD-10-CM | POA: Diagnosis not present

## 2021-06-23 DIAGNOSIS — I5032 Chronic diastolic (congestive) heart failure: Secondary | ICD-10-CM | POA: Diagnosis not present

## 2021-06-24 ENCOUNTER — Other Ambulatory Visit: Payer: Self-pay

## 2021-06-24 ENCOUNTER — Other Ambulatory Visit: Payer: Self-pay | Admitting: *Deleted

## 2021-06-24 DIAGNOSIS — K219 Gastro-esophageal reflux disease without esophagitis: Secondary | ICD-10-CM | POA: Insufficient documentation

## 2021-06-24 DIAGNOSIS — E559 Vitamin D deficiency, unspecified: Secondary | ICD-10-CM | POA: Insufficient documentation

## 2021-06-24 DIAGNOSIS — I509 Heart failure, unspecified: Secondary | ICD-10-CM | POA: Insufficient documentation

## 2021-06-24 DIAGNOSIS — M1991 Primary osteoarthritis, unspecified site: Secondary | ICD-10-CM | POA: Insufficient documentation

## 2021-06-24 DIAGNOSIS — M1A00X0 Idiopathic chronic gout, unspecified site, without tophus (tophi): Secondary | ICD-10-CM | POA: Insufficient documentation

## 2021-06-24 DIAGNOSIS — M810 Age-related osteoporosis without current pathological fracture: Secondary | ICD-10-CM | POA: Insufficient documentation

## 2021-06-24 DIAGNOSIS — J309 Allergic rhinitis, unspecified: Secondary | ICD-10-CM | POA: Insufficient documentation

## 2021-06-24 DIAGNOSIS — I700 Atherosclerosis of aorta: Secondary | ICD-10-CM | POA: Insufficient documentation

## 2021-06-24 DIAGNOSIS — G4733 Obstructive sleep apnea (adult) (pediatric): Secondary | ICD-10-CM | POA: Insufficient documentation

## 2021-06-24 DIAGNOSIS — M109 Gout, unspecified: Secondary | ICD-10-CM | POA: Insufficient documentation

## 2021-06-24 DIAGNOSIS — F3340 Major depressive disorder, recurrent, in remission, unspecified: Secondary | ICD-10-CM | POA: Insufficient documentation

## 2021-06-24 NOTE — Telephone Encounter (Signed)
Agree continue to monitor with conservative care and no change to cardiac medications at this time.

## 2021-06-24 NOTE — Telephone Encounter (Signed)
Received call that was sent straight to triage. Kim one of the nurses with [facility] was on the line with patient and her husband.  Patient complaining of low BP and dizziness. Patient's BP today is 127/83, HR 79. Patient's SBP has gotten as low as 103.  While on the phone discovered patient has been taking Mucinex BID, and she might be a little dehydrated. Encouraged patient to drink 8 oz of water when she takes Mucinex, and drink some fluids when she feels dizzy and her BP is low. Patient was prescribed Mucinex for just two weeks on 06/11/21, so patient should be done with medication in a few days. Informed patient that since her BP was extremely high on 05/31/21, we would probably not want to make any adjustments to her BP medications until she is off the Mucinex and see how she is doing then. Informed patient to check her BP before she takes her BP medications and if the SBP is below 114 to hold her BP medications and give our office a call. Will forward to Dr. Rayann Heman and Oda Kilts PA, who saw patient a week ago for further advisement.

## 2021-06-24 NOTE — Patient Outreach (Signed)
[facility]) Care Management Telephonic RN Care Manager Note   07/21/2021 Name:  [name] MRN:  [id number] DOB:  03/26/1946  Summary: Follow up outreach  Pneumonia coughing, sputum remains clear but productive  Continues with home health therapies No further OT only PT  Pain with coughing, of chest,  stomach,side & back   Pt able to accurate state upcoming appointments to include Labs for Cardiology on 06/28/21 and Dr Fuller Plan GI Monday 06/28/21  [name] inquired about Mucinex side effects (s/e) Patient reports having dizziness, nausea with standing, unable to obtain BP with standing, Machine indicates error messages Vitals reported today at home are 127/83 79 97% sitting Check BP qd at home (with Log) per spouse with noted systolic lows in the 545G recently  Pcp called by pt/spouse and did not want to adjust any cardiac medications per spouse/patient prior to patient outreach/evaluation by cardiologist.   Sleeping okay but up to bathroom related to diuretic  PCP does Transition of care (TOC) outreaches   Recommendations/Changes made from today's visit: Reviewed all s/e of mucinex and encouraged hydration Discussed possible dehydration with intake of mucinex bid and lasix dosages Conference Outreach to cardiology office [phone] spoke Abundio Miu (triage RN) at Dr Allred/[place] office  to review patient's symptoms  Recommendations include for pt to increase water with mucinex bid until finish ordered mucinex If SBP below 114 to outreach to the office  Triage RN to send message to cardiologist Educated on electrolytes after pt inquiry   Subjective: [name] is an 76 y.o. year old female who is a primary patient of [name], DO. The care management team was consulted for assistance with care management and/or care coordination needs.    [name] was referred to [facility] on 06/15/21 post  hospitalization (06/05/21-06/11/21) for screening for possible care  coordination and disease management/education needs.  Transition of care services noted to be completed by primary care MD office staff - [facility] Transition of Care will be completed by primary care provider office who will refer to [facility] care management if needed.  Telephonic RN Care Manager completed Telephone Visit today.   Objective:  Medications Reviewed Today     Reviewed by [name], MD (Physician) on 06/28/21 at [phone]  Med List Status: <None>   Medication Order Taking? Sig Documenting Provider Last Dose Status Informant  acetaminophen (TYLENOL) 500 MG tablet [id number] Yes Take 500-1,000 mg by mouth every 6 (six) hours as needed for mild pain or fever.  [provider] Taking Active Self  albuterol (VENTOLIN HFA) 108 (90 Base) MCG/ACT inhaler [id number] Yes  [provider] Taking Active Self  alendronate (FOSAMAX) 70 MG tablet [id number] Yes Take 1 tablet by mouth every Tuesday.  [provider] Taking Active Self           Med Note [name], AMANDA R   Thu Aug 13, 2015 11:18 AM)    allopurinol (ZYLOPRIM) 100 MG tablet [id number] Yes Take 100 mg by mouth 2 (two) times daily.  [provider] Taking Active Self           Med Note [name], MARIAN A   Mon Jul 18, 2016  2:00 PM)    aluminum hydroxide-magnesium carbonate (GAVISCON) 95-358 MG/15ML SUSP [id number] Yes Take 15 mLs by mouth as needed for indigestion or heartburn. [provider] Taking Active Self  CALCIUM PO [id number] Yes Take 500 mg by mouth daily. [provider]  Taking Active Self  Cholecalciferol (VITAMIN D3) 2000 UNITS TABS [id number] Yes Take 2,000 Units by mouth daily. [provider] Taking Active Self  dextromethorphan (DELSYM) 30 MG/5ML liquid [id number] Yes Take 60 mg by mouth as needed for cough. [provider] Taking Active Self  dicyclomine (BENTYL) 10 MG capsule [id number] Yes Take 1 capsule (10 mg total) by mouth 4  (four) times daily -  before meals and at bedtime. [name], MD Taking Active Self  ELIQUIS 5 MG TABS tablet [id number] Yes TAKE 1 TABLET BY MOUTH TWICE A DAY  Patient taking differently: Take 5 mg by mouth 2 (two) times daily.   [name], MD Taking Active Self  famotidine (PEPCID) 20 MG tablet [id number] Yes Take 20 mg by mouth at bedtime. [provider] Taking Active Self  folic acid (FOLVITE) 1 MG tablet [id number] Yes Take 1 mg by mouth 4 (four) times daily. [provider] Taking Active Self  furosemide (LASIX) 40 MG tablet [id number] Yes Take 1 tablet (40 mg total) by mouth 2 (two) times daily. [name], [name], MD Taking Active Self  guaiFENesin (MUCINEX) 600 MG 12 hr tablet [id number] Yes Take 1 tablet (600 mg total) by mouth 2 (two) times daily. [name], MD Taking Active   levothyroxine (SYNTHROID) 88 MCG tablet [id number] Yes Take 88 mcg by mouth daily.  [provider] Taking Active Self  loperamide (IMODIUM A-D) 2 MG tablet [id number] No Take 2 mg by mouth 4 (four) times daily as needed for diarrhea or loose stools.  Patient not taking: Reported on 06/28/2021   [provider] Not Taking Active Self  losartan (COZAAR) 25 MG tablet [id number] Yes TAKE 1 TABLET BY MOUTH  DAILY  Patient taking differently: Take 25 mg by mouth daily.   [name], MD Taking Active Self  methotrexate (RHEUMATREX) 2.5 MG tablet [id number] Yes Take 12.5 mg by mouth every Wednesday.  [provider] Taking Active Self           Med Note [name], MICHELE   Wed Sep 30, 2020  9:53 AM)    metoprolol succinate (TOPROL-XL) 25 MG 24 hr tablet [id number] Yes TAKE 1 AND 1/2 TABLETS BY  MOUTH TWICE DAILY  Patient taking differently: Take 37.5 mg by mouth in the morning and at bedtime.   [name], MD Taking Active Self  Multiple Vitamin (MULTIVITAMIN) tablet [id number] Yes Take 1 tablet by mouth daily. [provider] Taking Active Self  OVER THE  COUNTER MEDICATION [id number] Yes Place 1 drop into both eyes daily as needed (for dry eyes. Advanced eye relief). [provider] Taking Active Self  phenylephrine-shark liver oil-mineral oil-petrolatum (PREPARATION H) 0.25-3-14-71.9 % rectal ointment [id number] Yes Place 1 application rectally as needed for hemorrhoids. [provider] Taking Active Self  polyethylene glycol powder (GLYCOLAX/MIRALAX) 17 GM/SCOOP powder [id number] Yes Take 17 g by mouth as needed. [provider] Taking Active   potassium chloride SA (KLOR-CON M20) 20 MEQ tablet [id number] Yes Take 1 tablet (20 mEq total) by mouth 2 (two) times daily. [name], MD Taking Active   predniSONE (DELTASONE) 5 MG tablet [id number] Yes Take 5 mg by mouth daily with breakfast. [provider] Taking Active Self  rosuvastatin (CRESTOR) 5 MG tablet [id number] Yes Take 5 mg by mouth every other day.  [provider] Taking Active Self           Med Note [name], AMANDA D   Wed Dec 12, 2018  11:34 AM)    saccharomyces boulardii (FLORASTOR) 250 MG capsule [id number] Yes Take 250 mg by mouth 2 (two) times daily. [provider] Taking Active Self  shark liver oil-cocoa butter (PREPARATION H) 0.25-3-85.5 % suppository [id number] Yes Place 1 suppository rectally as needed for hemorrhoids. [provider] Taking Active   sotalol (BETAPACE) 80 MG tablet [id number] Yes TAKE 1 TABLET BY MOUTH  TWICE DAILY  Patient taking differently: Take 80 mg by mouth 2 (two) times daily.   [name], MD Taking Active Self  vitamin B-12 (CYANOCOBALAMIN) 1000 MCG tablet [id number] Yes Take 1,000 mcg by mouth daily. [provider] Taking Active Self             SDOH:  (Social Determinants of Health) assessments and interventions performed:    Care Plan  Review of patient past medical history, allergies, medications, health status, including review of consultants reports, laboratory and  other test data, was performed as part of comprehensive evaluation for care management services.   Care Plan : RN Care Manager Plan of Care  Updates made by Barbaraann Faster, RN since 07/21/2021 12:00 AM     Problem: Complex Care Coordination Needs and disease management in patient with PNA, CHF, HTN   Priority: High  Onset Date: 06/18/2021     Long-Range Goal: Establish Plan of Care for Management Complex SDOH Barriers, disease management and Care Coordination Needs in patient with PNA CHF, HTN   Start Date: 06/18/2021  This Visit's Progress: On track  Recent Progress: On track  Priority: High  Note:   Current Barriers:  Knowledge Deficits related to plan of care for management of CHF, HTN, and PNA  Care Coordination needs related to Limited social support and Limited education about CHF, HTN, PNA*  RN CM Clinical Goal(s):  Patient will verbalize understanding of plan for management of CHF, HTN, and PNA as evidenced by no admission in the next 30 days  through collaboration with RN Care manager, provider, and care team.   Interventions: Further outreaches to screen for care coordination and disease management needs Inter-disciplinary care team collaboration (see longitudinal plan of care) Evaluation of current treatment plan related to  self management and patient's adherence to plan as established by provider 06/24/21 Reviewed all s/e of mucinex and encouraged hydration Discussed possible dehydration with intake of mucinex bid and lasix dosages Recommendations include for pt to increase water with mucinex bid until finish ordered mucinex If SBP below 114 to outreach to the office  Triage RN to send message to cardiologist Educated on electrolytes after pt inquiry  Recommendations/Changes made from today's visit: RN CM recommended spacing out pills to prevent/decrease stomach pain with small frequent meals, hydration He is presently rubbing Voltaren cream on her back. RN suggested heat  and he will attempt this  He was offered support, commended  He was provided the numbers for RN CM and the 24 hour nurse call center numbers   Heart Failure Interventions:  (Status:  Goal on track:  Yes.) Long Term Goal Assessed social determinant of health barriers  Assessed for worsening symptoms  Pneumonia (PNA)  (Status:  Goal on track:  Yes.)  Short Term Goal 06/24/21 improved s/s except reports having dizziness, nausea with standing, unable to obtain BP with standing, Machine indicates error messages found to be possibly related to dehydration when reviewed with cardiology RN  via assisted conference call today  Evaluation of current treatment plan related to  PNA , Limited education about home  care for post PNA* self-management and patient's adherence to plan as established by provider. Discussed plans with patient for ongoing care management follow up and provided patient with direct contact information for care management team Evaluation of current treatment plan related to PNA and patient's adherence to plan as established by provider  Hypertension Interventions:  (Status:  Goal on track:  Yes.) Short Term Goal Last practice recorded BP readings:  BP Readings from Last 3 Encounters:  06/28/21 112/64  06/18/21 132/74  06/11/21 (!) 156/76  Most recent eGFR/CrCl:  Lab Results  Component Value Date   EGFR 47 (L) 06/28/2021    No components found for: CRCL  Evaluation of current treatment plan related to hypertension self management and patient's adherence to plan as established by provider  Patient Goals/Self-Care Activities: Take all medications as prescribed Attend all scheduled provider appointments Call provider office for new concerns or questions  watch for swelling in feet, ankles and legs every day take medications for blood pressure exactly as prescribed  Follow Up Plan:  The patient has been provided with contact information for the care management team and has been  advised to call with any health related questions or concerns.  The care management team will reach out to the patient again over the next 30+ business days.       Plan: The patient has been provided with contact information for the care management team and has been advised to call with any health related questions or concerns.  The care management team will reach out to the patient again over the next 30+ business days.  Jospeh Mangel L. Lavina Hamman, RN, BSN, [place] Coordinator Office number [phone] Main [facility] number [phone] Fax number [phone]

## 2021-06-25 DIAGNOSIS — E039 Hypothyroidism, unspecified: Secondary | ICD-10-CM | POA: Diagnosis not present

## 2021-06-25 DIAGNOSIS — E785 Hyperlipidemia, unspecified: Secondary | ICD-10-CM | POA: Diagnosis not present

## 2021-06-25 DIAGNOSIS — K8000 Calculus of gallbladder with acute cholecystitis without obstruction: Secondary | ICD-10-CM | POA: Diagnosis not present

## 2021-06-25 DIAGNOSIS — I2510 Atherosclerotic heart disease of native coronary artery without angina pectoris: Secondary | ICD-10-CM | POA: Diagnosis not present

## 2021-06-25 DIAGNOSIS — I700 Atherosclerosis of aorta: Secondary | ICD-10-CM | POA: Diagnosis not present

## 2021-06-25 DIAGNOSIS — I480 Paroxysmal atrial fibrillation: Secondary | ICD-10-CM | POA: Diagnosis not present

## 2021-06-25 DIAGNOSIS — I110 Hypertensive heart disease with heart failure: Secondary | ICD-10-CM | POA: Diagnosis not present

## 2021-06-25 DIAGNOSIS — Z9181 History of falling: Secondary | ICD-10-CM | POA: Diagnosis not present

## 2021-06-25 DIAGNOSIS — J470 Bronchiectasis with acute lower respiratory infection: Secondary | ICD-10-CM | POA: Diagnosis not present

## 2021-06-25 DIAGNOSIS — Z7983 Long term (current) use of bisphosphonates: Secondary | ICD-10-CM | POA: Diagnosis not present

## 2021-06-25 DIAGNOSIS — I088 Other rheumatic multiple valve diseases: Secondary | ICD-10-CM | POA: Diagnosis not present

## 2021-06-25 DIAGNOSIS — I5032 Chronic diastolic (congestive) heart failure: Secondary | ICD-10-CM | POA: Diagnosis not present

## 2021-06-25 DIAGNOSIS — Z7952 Long term (current) use of systemic steroids: Secondary | ICD-10-CM | POA: Diagnosis not present

## 2021-06-25 DIAGNOSIS — Z7901 Long term (current) use of anticoagulants: Secondary | ICD-10-CM | POA: Diagnosis not present

## 2021-06-25 NOTE — Telephone Encounter (Signed)
Reviewed [facility] note.  No further action needed at this time.

## 2021-06-28 ENCOUNTER — Other Ambulatory Visit: Payer: Self-pay

## 2021-06-28 ENCOUNTER — Encounter: Payer: Self-pay | Admitting: Gastroenterology

## 2021-06-28 ENCOUNTER — Other Ambulatory Visit: Payer: Medicare Other | Admitting: *Deleted

## 2021-06-28 ENCOUNTER — Ambulatory Visit: Payer: Medicare Other | Admitting: Gastroenterology

## 2021-06-28 ENCOUNTER — Other Ambulatory Visit: Payer: Medicare Other

## 2021-06-28 VITALS — BP 112/64 | HR 84 | Ht <= 58 in | Wt 204.4 lb

## 2021-06-28 DIAGNOSIS — I480 Paroxysmal atrial fibrillation: Secondary | ICD-10-CM

## 2021-06-28 DIAGNOSIS — R1011 Right upper quadrant pain: Secondary | ICD-10-CM

## 2021-06-28 DIAGNOSIS — K8020 Calculus of gallbladder without cholecystitis without obstruction: Secondary | ICD-10-CM | POA: Diagnosis not present

## 2021-06-28 DIAGNOSIS — K648 Other hemorrhoids: Secondary | ICD-10-CM | POA: Diagnosis not present

## 2021-06-28 DIAGNOSIS — K582 Mixed irritable bowel syndrome: Secondary | ICD-10-CM | POA: Diagnosis not present

## 2021-06-28 DIAGNOSIS — I5032 Chronic diastolic (congestive) heart failure: Secondary | ICD-10-CM | POA: Diagnosis not present

## 2021-06-28 LAB — BASIC METABOLIC PANEL
BUN/Creatinine Ratio: 19 (ref 12–28)
BUN: 23 mg/dL (ref 8–27)
CO2: 25 mmol/L (ref 20–29)
Calcium: 10.4 mg/dL — ABNORMAL HIGH (ref 8.7–10.3)
Chloride: 102 mmol/L (ref 96–106)
Creatinine, Ser: 1.22 mg/dL — ABNORMAL HIGH (ref 0.57–1.00)
Glucose: 107 mg/dL — ABNORMAL HIGH (ref 70–99)
Potassium: 4.3 mmol/L (ref 3.5–5.2)
Sodium: 139 mmol/L (ref 134–144)
eGFR: 47 mL/min/{1.73_m2} — ABNORMAL LOW (ref >59)

## 2021-06-28 LAB — MAGNESIUM: Magnesium: 2.2 mg/dL (ref 1.6–2.3)

## 2021-06-28 MED ORDER — DICYCLOMINE HCL 10 MG PO CAPS: 10.0000 mg | ORAL_CAPSULE | Freq: Four times a day (QID) | ORAL | 11 refills | Status: DC | PRN

## 2021-06-28 MED ORDER — DICYCLOMINE HCL 10 MG PO CAPS: 10.0000 mg | ORAL_CAPSULE | Freq: Four times a day (QID) | ORAL | 3 refills | Status: DC | PRN

## 2021-06-28 NOTE — Patient Instructions (Signed)
We have sent the following medications to your pharmacy for you to pick up at your convenience: dicyclomine.   Take Tylenol as needed for your musculoskeletal pain.  Follow up with your primary care physician.  The [place] [facility] providers would like to encourage you to use [facility] to communicate with providers for non-urgent requests or questions.  Due to long hold times on the telephone, sending your provider a message by [facility] may be a faster and more efficient way to get a response.  Please allow 48 business hours for a response.  Please remember that this is for non-urgent requests.   Due to recent changes in healthcare laws, you may see the results of your imaging and laboratory studies on MyChart before your provider has had a chance to review them.  We understand that in some cases there may be results that are confusing or concerning to you. Not all laboratory results come back in the same time frame and the provider may be waiting for multiple results in order to interpret others.  Please give [place] 48 hours in order for your provider to thoroughly review all the results before contacting the office for clarification of your results.   Thank you for choosing me and [facility].  Pricilla Riffle. Dagoberto Ligas., MD., Marval Regal

## 2021-06-28 NOTE — Progress Notes (Signed)
° ° °  History of Present Illness: This is a 76 year old female with chronic RUQ pain, alternating diarrhea and constipation, intermittent rectal bleeding.  She is accompanied by her husband.  She was hospitalized in Dec with CAP, acute resp failure, cholelithiasis with suspected chronic cholecystitis.  Due to her elevated operative risk General Surgery recommended a cholecystostomy tube if her symptoms persist or worsen.  She was treated with a course of antibiotics.  She has right costal margin right upper quadrant pain that has been problematic for years.  It does not appear to be any worse than usual.  It is not exacerbated by meals.  She also has had problems with alternating diarrhea and constipation.  Since discontinuing antibiotics it has mainly been constipation which has corrected with MiraLAX.  When her diarrhea is active for her constipation is active she often will have rectal bleeding which has been attributed to hemorrhoids.  She used hemorrhoidal creams and suppositories and her hemorrhoidal bleeding has now stopped.  Colonoscopy in 03/2011 was normal.   MRI/MRCP 06/08/2021 1. Cholelithiasis with mild wall thickening of a distended gallbladder, which given findings on same day nuclear medicine HIDA scan likely reflect changes of chronic cholecystitis. 2. Mild for age prominence of the biliary tree with gentle tapering of the duct to the level of the ampulla and no choledocholithiasis identified. 3. Mild hepatic steatosis.  HIDA 06/08/2021: 1. Patent cystic duct without evidence for acute cholecystitis. 2. Delayed filling of the gallbladder, which may be seen in the setting of chronic cholecystitis  Current Medications, Allergies, Past Medical History, Past Surgical History, Family History and Social History were reviewed in [employer] record.   Physical Exam: General: Well developed, well nourished, no acute distress Head: Normocephalic and atraumatic Eyes:  Sclerae anicteric, EOMI Ears: Normal auditory acuity Mouth: Not examined, mask on during Covid-19 pandemic Lungs: Clear throughout to auscultation Heart: Regular rate and rhythm; no murmurs, rubs or bruits Abdomen: Soft, non tender and non distended. No masses, hepatosplenomegaly or hernias noted. Normal Bowel sounds Rectal: Not done  Musculoskeletal: Symmetrical with no gross deformities  Pulses:  Normal pulses noted Extremities: No clubbing, cyanosis, edema or deformities noted Neurological: Alert oriented x 4, grossly nonfocal Psychological:  Alert and cooperative. Normal mood and affect   Assessment and Recommendations:  Chronic right costochondral margin pain, RUQ pain which is likely musculoskeletal. Tylenol prn.  Heating pad as needed.  Follow up with PCP.  Cholelithiasis with suspected chronic cholecystitis however no active symptoms at this time. Follow up with [facility] if symptoms recurrn.  IBS with alternating diarrhea and constipation. Resume dicyclomine 10 mg po qid prn. Miralax qd prn constipation.  Imodium twice daily as needed diarrhea. Intermittent rectal bleeding felt secondary to hemorrhoids.  She is at high risk for colonoscopy and anesthesia so colonoscopy is deferred. GERD. Continue famotidine 20 mg qd. Follow antireflux measures long term.  Follow-up with PCP. Internal hemorrhoids with intermittent bleeding.  Preparation H suppositories twice daily as needed and Preparation H cream twice daily as needed. Macrocytosis without anemia.  B12 and folate normal.  Follow-up with PCP. Chronic diastolic CHF.  Follow-up with cardiology. PAF on Eliquis.  Follow-up with cardiology. Morbid obesity. BMI=47.50.  Follow-up with PCP.

## 2021-06-29 DIAGNOSIS — M109 Gout, unspecified: Secondary | ICD-10-CM | POA: Diagnosis not present

## 2021-06-29 DIAGNOSIS — M5136 Other intervertebral disc degeneration, lumbar region: Secondary | ICD-10-CM | POA: Diagnosis not present

## 2021-06-29 DIAGNOSIS — M549 Dorsalgia, unspecified: Secondary | ICD-10-CM | POA: Diagnosis not present

## 2021-06-29 DIAGNOSIS — M1990 Unspecified osteoarthritis, unspecified site: Secondary | ICD-10-CM | POA: Diagnosis not present

## 2021-06-29 DIAGNOSIS — M810 Age-related osteoporosis without current pathological fracture: Secondary | ICD-10-CM | POA: Diagnosis not present

## 2021-06-29 DIAGNOSIS — Z79899 Other long term (current) drug therapy: Secondary | ICD-10-CM | POA: Diagnosis not present

## 2021-06-29 DIAGNOSIS — M3390 Dermatopolymyositis, unspecified, organ involvement unspecified: Secondary | ICD-10-CM | POA: Diagnosis not present

## 2021-06-29 DIAGNOSIS — Z7952 Long term (current) use of systemic steroids: Secondary | ICD-10-CM | POA: Diagnosis not present

## 2021-06-29 DIAGNOSIS — M150 Primary generalized (osteo)arthritis: Secondary | ICD-10-CM | POA: Diagnosis not present

## 2021-06-30 DIAGNOSIS — K8000 Calculus of gallbladder with acute cholecystitis without obstruction: Secondary | ICD-10-CM | POA: Diagnosis not present

## 2021-06-30 DIAGNOSIS — I5032 Chronic diastolic (congestive) heart failure: Secondary | ICD-10-CM | POA: Diagnosis not present

## 2021-06-30 DIAGNOSIS — Z7952 Long term (current) use of systemic steroids: Secondary | ICD-10-CM | POA: Diagnosis not present

## 2021-06-30 DIAGNOSIS — E785 Hyperlipidemia, unspecified: Secondary | ICD-10-CM | POA: Diagnosis not present

## 2021-06-30 DIAGNOSIS — I700 Atherosclerosis of aorta: Secondary | ICD-10-CM | POA: Diagnosis not present

## 2021-06-30 DIAGNOSIS — I110 Hypertensive heart disease with heart failure: Secondary | ICD-10-CM | POA: Diagnosis not present

## 2021-06-30 DIAGNOSIS — I480 Paroxysmal atrial fibrillation: Secondary | ICD-10-CM | POA: Diagnosis not present

## 2021-06-30 DIAGNOSIS — E039 Hypothyroidism, unspecified: Secondary | ICD-10-CM | POA: Diagnosis not present

## 2021-06-30 DIAGNOSIS — Z9181 History of falling: Secondary | ICD-10-CM | POA: Diagnosis not present

## 2021-06-30 DIAGNOSIS — Z7901 Long term (current) use of anticoagulants: Secondary | ICD-10-CM | POA: Diagnosis not present

## 2021-06-30 DIAGNOSIS — Z7983 Long term (current) use of bisphosphonates: Secondary | ICD-10-CM | POA: Diagnosis not present

## 2021-06-30 DIAGNOSIS — I2510 Atherosclerotic heart disease of native coronary artery without angina pectoris: Secondary | ICD-10-CM | POA: Diagnosis not present

## 2021-06-30 DIAGNOSIS — J470 Bronchiectasis with acute lower respiratory infection: Secondary | ICD-10-CM | POA: Diagnosis not present

## 2021-06-30 DIAGNOSIS — I088 Other rheumatic multiple valve diseases: Secondary | ICD-10-CM | POA: Diagnosis not present

## 2021-07-01 DIAGNOSIS — Z7952 Long term (current) use of systemic steroids: Secondary | ICD-10-CM | POA: Diagnosis not present

## 2021-07-01 DIAGNOSIS — I110 Hypertensive heart disease with heart failure: Secondary | ICD-10-CM | POA: Diagnosis not present

## 2021-07-01 DIAGNOSIS — I2510 Atherosclerotic heart disease of native coronary artery without angina pectoris: Secondary | ICD-10-CM | POA: Diagnosis not present

## 2021-07-01 DIAGNOSIS — J470 Bronchiectasis with acute lower respiratory infection: Secondary | ICD-10-CM | POA: Diagnosis not present

## 2021-07-01 DIAGNOSIS — I480 Paroxysmal atrial fibrillation: Secondary | ICD-10-CM | POA: Diagnosis not present

## 2021-07-01 DIAGNOSIS — I088 Other rheumatic multiple valve diseases: Secondary | ICD-10-CM | POA: Diagnosis not present

## 2021-07-01 DIAGNOSIS — E039 Hypothyroidism, unspecified: Secondary | ICD-10-CM | POA: Diagnosis not present

## 2021-07-01 DIAGNOSIS — Z9181 History of falling: Secondary | ICD-10-CM | POA: Diagnosis not present

## 2021-07-01 DIAGNOSIS — I5032 Chronic diastolic (congestive) heart failure: Secondary | ICD-10-CM | POA: Diagnosis not present

## 2021-07-01 DIAGNOSIS — K8000 Calculus of gallbladder with acute cholecystitis without obstruction: Secondary | ICD-10-CM | POA: Diagnosis not present

## 2021-07-01 DIAGNOSIS — E785 Hyperlipidemia, unspecified: Secondary | ICD-10-CM | POA: Diagnosis not present

## 2021-07-01 DIAGNOSIS — Z7901 Long term (current) use of anticoagulants: Secondary | ICD-10-CM | POA: Diagnosis not present

## 2021-07-01 DIAGNOSIS — Z7983 Long term (current) use of bisphosphonates: Secondary | ICD-10-CM | POA: Diagnosis not present

## 2021-07-01 DIAGNOSIS — I700 Atherosclerosis of aorta: Secondary | ICD-10-CM | POA: Diagnosis not present

## 2021-07-06 ENCOUNTER — Ambulatory Visit (INDEPENDENT_AMBULATORY_CARE_PROVIDER_SITE_OTHER): Payer: Medicare Other

## 2021-07-06 DIAGNOSIS — I2510 Atherosclerotic heart disease of native coronary artery without angina pectoris: Secondary | ICD-10-CM | POA: Diagnosis not present

## 2021-07-06 DIAGNOSIS — Z7901 Long term (current) use of anticoagulants: Secondary | ICD-10-CM | POA: Diagnosis not present

## 2021-07-06 DIAGNOSIS — I480 Paroxysmal atrial fibrillation: Secondary | ICD-10-CM | POA: Diagnosis not present

## 2021-07-06 DIAGNOSIS — K8000 Calculus of gallbladder with acute cholecystitis without obstruction: Secondary | ICD-10-CM | POA: Diagnosis not present

## 2021-07-06 DIAGNOSIS — I088 Other rheumatic multiple valve diseases: Secondary | ICD-10-CM | POA: Diagnosis not present

## 2021-07-06 DIAGNOSIS — Z7983 Long term (current) use of bisphosphonates: Secondary | ICD-10-CM | POA: Diagnosis not present

## 2021-07-06 DIAGNOSIS — J470 Bronchiectasis with acute lower respiratory infection: Secondary | ICD-10-CM | POA: Diagnosis not present

## 2021-07-06 DIAGNOSIS — E039 Hypothyroidism, unspecified: Secondary | ICD-10-CM | POA: Diagnosis not present

## 2021-07-06 DIAGNOSIS — I700 Atherosclerosis of aorta: Secondary | ICD-10-CM | POA: Diagnosis not present

## 2021-07-06 DIAGNOSIS — I5032 Chronic diastolic (congestive) heart failure: Secondary | ICD-10-CM | POA: Diagnosis not present

## 2021-07-06 DIAGNOSIS — Z9181 History of falling: Secondary | ICD-10-CM | POA: Diagnosis not present

## 2021-07-06 DIAGNOSIS — Z7952 Long term (current) use of systemic steroids: Secondary | ICD-10-CM | POA: Diagnosis not present

## 2021-07-06 DIAGNOSIS — E785 Hyperlipidemia, unspecified: Secondary | ICD-10-CM | POA: Diagnosis not present

## 2021-07-06 DIAGNOSIS — I110 Hypertensive heart disease with heart failure: Secondary | ICD-10-CM | POA: Diagnosis not present

## 2021-07-06 LAB — CUP PACEART REMOTE DEVICE CHECK
Date Time Interrogation Session: 20230124004302
Implantable Pulse Generator Implant Date: 20200710

## 2021-07-09 DIAGNOSIS — E039 Hypothyroidism, unspecified: Secondary | ICD-10-CM | POA: Diagnosis not present

## 2021-07-09 DIAGNOSIS — I110 Hypertensive heart disease with heart failure: Secondary | ICD-10-CM | POA: Diagnosis not present

## 2021-07-09 DIAGNOSIS — E785 Hyperlipidemia, unspecified: Secondary | ICD-10-CM | POA: Diagnosis not present

## 2021-07-09 DIAGNOSIS — J470 Bronchiectasis with acute lower respiratory infection: Secondary | ICD-10-CM | POA: Diagnosis not present

## 2021-07-09 DIAGNOSIS — I5032 Chronic diastolic (congestive) heart failure: Secondary | ICD-10-CM | POA: Diagnosis not present

## 2021-07-09 DIAGNOSIS — Z7983 Long term (current) use of bisphosphonates: Secondary | ICD-10-CM | POA: Diagnosis not present

## 2021-07-09 DIAGNOSIS — I2510 Atherosclerotic heart disease of native coronary artery without angina pectoris: Secondary | ICD-10-CM | POA: Diagnosis not present

## 2021-07-09 DIAGNOSIS — K8000 Calculus of gallbladder with acute cholecystitis without obstruction: Secondary | ICD-10-CM | POA: Diagnosis not present

## 2021-07-09 DIAGNOSIS — I700 Atherosclerosis of aorta: Secondary | ICD-10-CM | POA: Diagnosis not present

## 2021-07-09 DIAGNOSIS — Z7901 Long term (current) use of anticoagulants: Secondary | ICD-10-CM | POA: Diagnosis not present

## 2021-07-09 DIAGNOSIS — Z9181 History of falling: Secondary | ICD-10-CM | POA: Diagnosis not present

## 2021-07-09 DIAGNOSIS — I480 Paroxysmal atrial fibrillation: Secondary | ICD-10-CM | POA: Diagnosis not present

## 2021-07-09 DIAGNOSIS — I088 Other rheumatic multiple valve diseases: Secondary | ICD-10-CM | POA: Diagnosis not present

## 2021-07-09 DIAGNOSIS — Z7952 Long term (current) use of systemic steroids: Secondary | ICD-10-CM | POA: Diagnosis not present

## 2021-07-16 NOTE — Progress Notes (Signed)
[facility] Summary Report / Loop Recorder 

## 2021-07-19 DIAGNOSIS — Z09 Encounter for follow-up examination after completed treatment for conditions other than malignant neoplasm: Secondary | ICD-10-CM | POA: Diagnosis not present

## 2021-07-19 DIAGNOSIS — K819 Cholecystitis, unspecified: Secondary | ICD-10-CM | POA: Diagnosis not present

## 2021-07-20 DIAGNOSIS — H35383 Toxic maculopathy, bilateral: Secondary | ICD-10-CM | POA: Diagnosis not present

## 2021-07-20 DIAGNOSIS — H43813 Vitreous degeneration, bilateral: Secondary | ICD-10-CM | POA: Diagnosis not present

## 2021-07-20 DIAGNOSIS — H35372 Puckering of macula, left eye: Secondary | ICD-10-CM | POA: Diagnosis not present

## 2021-07-20 DIAGNOSIS — H3581 Retinal edema: Secondary | ICD-10-CM | POA: Diagnosis not present

## 2021-07-22 ENCOUNTER — Other Ambulatory Visit: Payer: Self-pay | Admitting: *Deleted

## 2021-07-22 NOTE — Patient Outreach (Signed)
[name]) Care Management  07/22/2021  [name] 07-25-1945 [id number]   [facility] Case closure -external care management program  RN CM scheduled pt to follow up on her external RN CM program start date/connection assessment RN CM received a message from [place] that pt called [facility] stating she had received a reminder call regarding a upcoming appointment with care coordinator. Patient states she asked to be removed from the program previously and does not understand why is she still getting scheduled for outreach calls.   The patient requested to please be removed from [facility] program as she is now set up with her provider for concerns.    Plan [facility] RN CM will close case as requested per pt- external care management program initiated  Case closure letters sent to patient and MD  Joelene Millin L. Lavina Hamman, RN, BSN, [place] Coordinator Office number [phone] Mobile number [phone]  Main [facility] number [phone] Fax number [phone]

## 2021-07-26 ENCOUNTER — Ambulatory Visit: Payer: Medicare Other | Admitting: *Deleted

## 2021-08-06 ENCOUNTER — Other Ambulatory Visit: Payer: Self-pay | Admitting: Internal Medicine

## 2021-08-06 NOTE — Telephone Encounter (Signed)
Eliquis 5 mg refill request received. Patient is 76 years old, weight- 92.7 kg, Crea- 1.22 on 06/28/21, Diagnosis- PAF, and last seen by Barrington Ellison, PA on 06/18/21. Dose is appropriate based on dosing criteria. Will send in refill to requested pharmacy.

## 2021-08-09 ENCOUNTER — Ambulatory Visit (INDEPENDENT_AMBULATORY_CARE_PROVIDER_SITE_OTHER): Payer: Medicare Other

## 2021-08-09 DIAGNOSIS — I480 Paroxysmal atrial fibrillation: Secondary | ICD-10-CM

## 2021-08-09 LAB — CUP PACEART REMOTE DEVICE CHECK
Date Time Interrogation Session: 20230226230742
Implantable Pulse Generator Implant Date: 20200710

## 2021-08-16 NOTE — Progress Notes (Signed)
[facility] Summary Report / Loop Recorder 

## 2021-09-13 ENCOUNTER — Ambulatory Visit (INDEPENDENT_AMBULATORY_CARE_PROVIDER_SITE_OTHER): Payer: Medicare Other

## 2021-09-13 DIAGNOSIS — I480 Paroxysmal atrial fibrillation: Secondary | ICD-10-CM

## 2021-09-14 LAB — CUP PACEART REMOTE DEVICE CHECK
Date Time Interrogation Session: 20230401000637
Implantable Pulse Generator Implant Date: 20200710

## 2021-09-21 ENCOUNTER — Encounter: Payer: Self-pay | Admitting: Student

## 2021-09-21 ENCOUNTER — Ambulatory Visit: Payer: Medicare Other | Admitting: Student

## 2021-09-21 VITALS — BP 137/74 | HR 77 | Ht <= 58 in | Wt 226.2 lb

## 2021-09-21 DIAGNOSIS — I480 Paroxysmal atrial fibrillation: Secondary | ICD-10-CM | POA: Diagnosis not present

## 2021-09-21 DIAGNOSIS — I5032 Chronic diastolic (congestive) heart failure: Secondary | ICD-10-CM | POA: Diagnosis not present

## 2021-09-21 LAB — CUP PACEART INCLINIC DEVICE CHECK
Date Time Interrogation Session: 20230411114646
Implantable Pulse Generator Implant Date: 20200710

## 2021-09-21 LAB — BASIC METABOLIC PANEL
BUN/Creatinine Ratio: 24 (ref 12–28)
BUN: 24 mg/dL (ref 8–27)
CO2: 25 mmol/L (ref 20–29)
Calcium: 9.5 mg/dL (ref 8.7–10.3)
Chloride: 107 mmol/L — ABNORMAL HIGH (ref 96–106)
Creatinine, Ser: 0.99 mg/dL (ref 0.57–1.00)
Glucose: 113 mg/dL — ABNORMAL HIGH (ref 70–99)
Potassium: 4.1 mmol/L (ref 3.5–5.2)
Sodium: 146 mmol/L — ABNORMAL HIGH (ref 134–144)
eGFR: 59 mL/min/{1.73_m2} — ABNORMAL LOW (ref >59)

## 2021-09-21 LAB — MAGNESIUM: Magnesium: 2.1 mg/dL (ref 1.6–2.3)

## 2021-09-21 NOTE — Progress Notes (Signed)
? ? ?Electrophysiology Office Note ?Date: 09/21/2021 ? ?ID:  [name], DOB [birth year], MRN [id number] ? ?PCP: [name], DO ?Primary Cardiologist: [name], MD ?Electrophysiologist: [name], MD  ? ?CC: ILR follow-up ? ?[name] is a 76 y.o. female seen today for Dr. Rayann Heman . she presents today for routine electrophysiology followup.  Since last being seen in our clinic, the patient reports doing very well from a cardiac perspective  she denies chest pain, palpitations, dyspnea, PND, orthopnea, nausea, vomiting, dizziness, syncope, edema, weight gain, or early satiety. ? ?Device History: ?Medtronic loop recorder implanted 12/2018 for paroxysmal atrial fibrillation ? ?Past Medical History:  ?Diagnosis Date  ? Anemia   ? Bilateral leg edema   ? C. difficile enteritis   ? CAD (coronary artery disease)   ? a. LHC 03/2006: EF 65%, mid LAD 40-50%, ostial D1 70-80%, normal circumflex, normal RCA;  b. Lexiscan Myoview 09/2010: No ischemia or scar, EF 75%;  c. Lex MV 5/14:  Normal, no ischemia, EF 71% // Myoview 08/2018: EF 43, normal perfusion; Intermediate Risk due to low EF (Echo 07/2018 with normal EF)   ? Cataract   ? bilateral-had surgery  ? Cellulitis of right leg   ? Hx - resolved  ? Chronic diastolic CHF (congestive heart failure) ([place])   ? Echo 8/14:  Mild LVH, EF 55-65%, normal wall motion, Tr AI, mild MR, mod TR, PASP 45 // 12/2014 Echo: EF 60-65%, nl wall motion, mildly dil RA/LA, PASP 3mHg.// Echo 07/2018: EF 60-65, mild LVH, mod diastolic dysfunction, normal RVSF, severe LAE, mod MAC, mild MR, mild TR   ? Continuous urine leakage   ? [place]atomy[place] ([place]   ? Deviated septum   ? Essential hypertension   ? Family history of adverse reaction to anesthesia   ? .' MY SON IS A DIFFICULT INTUBATION"  ? Gall stones   ? a. 08/2014 Abd U/S:  Large gallstone measuring 4.2 cm.   ? GERD (gastroesophageal reflux disease)   ? Gout   ? Hiatal hernia   ? History of pancreatitis   ? History of PFTs   ? a. PFTs  10/2012: normal.   ? Hyperlipidemia   ? Hypothyroidism   ? Internal hemorrhoids   ? Morbid obesity ([place]   ? Multifocal atrial tachycardia ([place] 08/10/2018  ? Noted during admission in 07/2018  ? Osteoarthritis   ? spine hips knees  ? PAF (paroxysmal atrial fibrillation) ([place]   ? a. CHA2DS2VASc = 5-->poor OAC candidate 2/2 falls. No problems now per patient 03/2016  ? Pneumonia   ? Seizures ([place]   ? as a baby  ? Shingles   ? Hx  ? Sleep apnea   ? Does not use CPAP  ? [place]inal cord [place] ([place] 02/21/2014  ? ?Past Surgical History:  ?Procedure Laterality Date  ? CATARACT EXTRACTION, BILATERAL Bilateral   ? COLONOSCOPY  03/2011  ? Implantable loop recorder placement  12/21/2018  ? Medtronic Reveal [place] [id number]) implanted by Dr ARayann Hemanin office for AF management  ? NASAL SEPTUM SURGERY    ? TOTAL ABDOMINAL HYSTERECTOMY    ? VERTEBROPLASTY  Sept 7 and May 30, 2014  ? x 2, T11/T12  ? WISDOM TOOTH EXTRACTION    ? ? ?Current Outpatient Medications  ?Medication Sig Dispense Refill  ? acetaminophen (TYLENOL) 500 MG tablet Take 500-1,000 mg by mouth every 6 (six) hours as needed for mild pain or fever.     ? albuterol (  VENTOLIN HFA) 108 (90 Base) MCG/ACT inhaler     ? alendronate (FOSAMAX) 70 MG tablet Take 1 tablet by mouth every Tuesday.     ? allopurinol (ZYLOPRIM) 100 MG tablet Take 100 mg by mouth 2 (two) times daily.     ? aluminum hydroxide-magnesium carbonate (GAVISCON) 95-358 MG/15ML SUSP Take 15 mLs by mouth as needed for indigestion or heartburn.    ? CALCIUM PO Take 500 mg by mouth daily.    ? Cholecalciferol (VITAMIN D3) 2000 UNITS TABS Take 2,000 Units by mouth daily.    ? dextromethorphan (DELSYM) 30 MG/5ML liquid Take 60 mg by mouth as needed for cough.    ? dicyclomine (BENTYL) 10 MG capsule Take 1 capsule (10 mg total) by mouth 4 (four) times daily as needed for spasms. 360 capsule 3  ? ELIQUIS 5 MG TABS tablet TAKE 1 TABLET BY MOUTH TWICE A DAY 60 tablet 5  ? famotidine (PEPCID) 20 MG  tablet Take 20 mg by mouth at bedtime.    ? folic acid (FOLVITE) 1 MG tablet Take 1 mg by mouth 4 (four) times daily.    ? furosemide (LASIX) 40 MG tablet Take 1 tablet (40 mg total) by mouth 2 (two) times daily. 120 tablet 5  ? guaiFENesin (MUCINEX) 600 MG 12 hr tablet Take 1 tablet (600 mg total) by mouth 2 (two) times daily. 30 tablet 0  ? levothyroxine (SYNTHROID) 88 MCG tablet Take 88 mcg by mouth daily.     ? loperamide (IMODIUM A-D) 2 MG tablet Take 2 mg by mouth 4 (four) times daily as needed for diarrhea or loose stools.    ? losartan (COZAAR) 25 MG tablet TAKE 1 TABLET BY MOUTH  DAILY (Patient taking differently: Take 25 mg by mouth daily.) 90 tablet 3  ? methotrexate (RHEUMATREX) 2.5 MG tablet Take 12.5 mg by mouth every Wednesday.   3  ? metoprolol succinate (TOPROL-XL) 25 MG 24 hr tablet TAKE 1 AND 1/2 TABLETS BY  MOUTH TWICE DAILY (Patient taking differently: Take 37.5 mg by mouth in the morning and at bedtime.) 270 tablet 3  ? Multiple Vitamin (MULTIVITAMIN) tablet Take 1 tablet by mouth daily.    ? OVER THE COUNTER MEDICATION Place 1 drop into both eyes daily as needed (for dry eyes. Advanced eye relief).    ? phenylephrine-shark liver oil-mineral oil-petrolatum (PREPARATION H) 0.25-3-14-71.9 % rectal ointment Place 1 application rectally as needed for hemorrhoids.    ? polyethylene glycol powder (GLYCOLAX/MIRALAX) 17 GM/SCOOP powder Take 17 g by mouth as needed.    ? potassium chloride SA (KLOR-CON M20) 20 MEQ tablet Take 1 tablet (20 mEq total) by mouth 2 (two) times daily. 60 tablet 11  ? predniSONE (DELTASONE) 5 MG tablet Take 5 mg by mouth daily with breakfast.    ? rosuvastatin (CRESTOR) 5 MG tablet Take 5 mg by mouth every other day.     ? saccharomyces boulardii (FLORASTOR) 250 MG capsule Take 250 mg by mouth 2 (two) times daily.    ? shark liver oil-cocoa butter (PREPARATION H) 0.25-3-85.5 % suppository Place 1 suppository rectally as needed for hemorrhoids.    ? sotalol (BETAPACE) 80 MG  tablet TAKE 1 TABLET BY MOUTH  TWICE DAILY (Patient taking differently: Take 80 mg by mouth 2 (two) times daily.) 180 tablet 3  ? vitamin B-12 (CYANOCOBALAMIN) 1000 MCG tablet Take 1,000 mcg by mouth daily.    ? ?No current facility-administered medications for this visit.  ? ? ?Allergies:  Butoconazole, Cephalexin, Lipitor [atorvastatin calcium], Atorvastatin, Butoconazole nitrate (1 dose), Cortisone, Hydrocodone, and Sotalol hcl  ? ?Social History: ?Social History  ? ?Socioeconomic History  ? Marital status: Married  ?  Spouse name: Fritz Pickerel  ? Number of children: 2  ? Years of education: Not on file  ? Highest education level: Not on file  ?Occupational History  ?  Employer: UNEMPLOYED  ?Tobacco Use  ? Smoking status: Never  ? Smokeless tobacco: Never  ?Vaping Use  ? Vaping Use: Never used  ?Substance and Sexual Activity  ? Alcohol use: No  ? Drug use: No  ? Sexual activity: Not Currently  ?  Birth control/protection: Post-menopausal  ?Other Topics Concern  ? Not on file  ?Social History Narrative  ? Lives with husband Fritz Pickerel Primary caregiver  ? ?Social Determinants of Health  ? ?Financial Resource Strain: Low Risk   ? Difficulty of Paying Living Expenses: Not hard at all  ?Food Insecurity: No Food Insecurity  ? Worried About Charity fundraiser in the Last Year: Never true  ? Ran Out of Food in the Last Year: Never true  ?Transportation Needs: No Transportation Needs  ? Lack of Transportation (Medical): No  ? Lack of Transportation (Non-Medical): No  ?Physical Activity: Not on file  ?Stress: No Stress Concern Present  ? Feeling of Stress : Only a little  ?Social Connections: Not on file  ?Intimate Partner Violence: Not At Risk  ? Fear of Current or Ex-Partner: No  ? Emotionally Abused: No  ? Physically Abused: No  ? Sexually Abused: No  ? ? ?Family History: ?Family History  ?Problem Relation Age of Onset  ? Colon cancer Mother   ?     mets, spot on lungs and spine  ? Coronary artery disease Father   ? Emphysema  Father   ? Heart attack Brother   ?     x 2  ? Colon cancer Paternal Uncle   ? Kidney disease Brother   ? Hypertension Brother   ? Stroke Neg Hx   ? Esophageal cancer Neg Hx   ? ? ? ?Review of Systems: ?All othe

## 2021-09-21 NOTE — Patient Instructions (Signed)
Medication Instructions:  ?Your physician recommends that you continue on your current medications as directed. Please refer to the Current Medication list given to you today. ? ?*If you need a refill on your cardiac medications before your next appointment, please call your pharmacy* ? ? ?Lab Work: ?TODAY: BMET, [place] ? ?If you have labs (blood work) drawn today and your tests are completely normal, you will receive your results only by: ?MyChart Message (if you have MyChart) OR ?A paper copy in the mail ?If you have any lab test that is abnormal or we need to change your treatment, we will call you to review the results. ? ? ?Follow-Up: ?At [facility], you and your health needs are our priority.  As part of our continuing mission to provide you with exceptional heart care, we have created designated Provider Care Teams.  These Care Teams include your primary Cardiologist (physician) and Advanced Practice Providers (APPs -  Physician Assistants and Nurse Practitioners) who all work together to provide you with the care you need, when you need it. ? ?We recommend signing up for the patient portal called "MyChart".  Sign up information is provided on this After Visit Summary.  MyChart is used to connect with patients for Virtual Visits (Telemedicine).  Patients are able to view lab/test results, encounter notes, upcoming appointments, etc.  Non-urgent messages can be sent to your provider as well.   ?To learn more about what you can do with MyChart, go to [email or web address].   ? ?Your next appointment:   ?6 month(s) ? ?The format for your next appointment:   ?In Person ? ?Provider:   ?Allegra Lai, MD  ? ? ?Important Information About Sugar ? ? ? ? ?  ?

## 2021-09-27 DIAGNOSIS — M1990 Unspecified osteoarthritis, unspecified site: Secondary | ICD-10-CM | POA: Diagnosis not present

## 2021-09-27 DIAGNOSIS — M109 Gout, unspecified: Secondary | ICD-10-CM | POA: Diagnosis not present

## 2021-09-27 DIAGNOSIS — Z7952 Long term (current) use of systemic steroids: Secondary | ICD-10-CM | POA: Diagnosis not present

## 2021-09-27 DIAGNOSIS — Z79899 Other long term (current) drug therapy: Secondary | ICD-10-CM | POA: Diagnosis not present

## 2021-09-27 DIAGNOSIS — M3390 Dermatopolymyositis, unspecified, organ involvement unspecified: Secondary | ICD-10-CM | POA: Diagnosis not present

## 2021-09-27 DIAGNOSIS — M549 Dorsalgia, unspecified: Secondary | ICD-10-CM | POA: Diagnosis not present

## 2021-09-27 DIAGNOSIS — M5136 Other intervertebral disc degeneration, lumbar region: Secondary | ICD-10-CM | POA: Diagnosis not present

## 2021-09-27 DIAGNOSIS — M150 Primary generalized (osteo)arthritis: Secondary | ICD-10-CM | POA: Diagnosis not present

## 2021-09-27 DIAGNOSIS — M810 Age-related osteoporosis without current pathological fracture: Secondary | ICD-10-CM | POA: Diagnosis not present

## 2021-09-27 NOTE — Progress Notes (Signed)
[facility] Summary Report / Loop Recorder 

## 2021-10-05 ENCOUNTER — Telehealth: Payer: Self-pay | Admitting: Gastroenterology

## 2021-10-05 NOTE — Telephone Encounter (Signed)
Spoke with patient regarding Dr. Lynne Leader recommendations. She has been scheduled to see Alonza Bogus, PA tomorrow 4/26 at 1:30 pm.  ?

## 2021-10-05 NOTE — Telephone Encounter (Signed)
Patient called, states she's having issues with hemorrhoids. Per patient, has done everything Dr. Fuller Plan advised but still experiencing pain. Patient wants to know if there are stronger meds available for hemorrhoids. Please advise.  ?
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
## 2021-10-05 NOTE — Telephone Encounter (Signed)
Recommend RectiCare (OTC) PR tid prn and schedule office appt with APP this week.  ?

## 2021-10-06 ENCOUNTER — Encounter: Payer: Self-pay | Admitting: Gastroenterology

## 2021-10-06 ENCOUNTER — Ambulatory Visit: Payer: Medicare Other | Admitting: Gastroenterology

## 2021-10-06 VITALS — BP 116/70 | HR 68 | Ht <= 58 in | Wt 227.5 lb

## 2021-10-06 DIAGNOSIS — K648 Other hemorrhoids: Secondary | ICD-10-CM

## 2021-10-06 DIAGNOSIS — R21 Rash and other nonspecific skin eruption: Secondary | ICD-10-CM | POA: Diagnosis not present

## 2021-10-06 MED ORDER — HYDROCORTISONE (PERIANAL) 2.5 % EX CREA: 1.0000 "application " | TOPICAL_CREAM | Freq: Two times a day (BID) | CUTANEOUS | 1 refills | Status: DC

## 2021-10-06 NOTE — Patient Instructions (Signed)
We have sent the following medications to your pharmacy for you to pick up at your convenience: ?Hydrocortisone 2.5 % cream - Cover a Preparation H suppository nightly for 7-10 days. ? ?May use Zinc Oxide ([place] Extra Strength) to the perianal area multiple times daily. ? ?May use Recticare 5% as needed. ? ?Call or send Mychart message with an update in 7-10 days, ask for [place], Therapist, sports. ? ?If you are age 75 or older, your body mass index should be between 23-30. Your Body mass index is 52.88 kg/m?Marland Kitchen If this is out of the aforementioned range listed, please consider follow up with your Primary Care Provider. ?________________________________________________________ ? ?The [facility] providers would like to encourage you to use [facility] to communicate with providers for non-urgent requests or questions.  Due to long hold times on the telephone, sending your provider a message by [facility] may be a faster and more efficient way to get a response.  Please allow 48 business hours for a response.  Please remember that this is for non-urgent requests.  ?_______________________________________________________ ? ?

## 2021-10-06 NOTE — Progress Notes (Signed)
? ? ? ?10/06/2021 ?[name] ?[id number] ?12-11-1945 ? ? ?HISTORY OF PRESENT ILLNESS: This is a 76 year old female who is a patient Dr. Lynne Leader.  She presents here today with complaints of rectal/anal discomfort/irritation and protrusion that she believes is due to hemorrhoids.  She says that this has been an issue since she was in the hospital back in December.  She says that they used roughed washcloths when they were cleaning her in the hospital and believes that is what prompted this issue most recently.  She has seen some bright red blood on the toilet paper and some in the toilet.  Her stools alternate with different forms.  She says that she has been using Tucks wipes, Preparation H ointment, Preparation H suppositories, etc. on the area.  Back in December she was hospitalized and was having right upper quadrant abdominal pain that they believe is due to her gallbladder, but they decided not to proceed with cholecystectomy due to all her other medical problems.  If she has any other issues the plan for cholecystostomy tube. ? ?Last colonoscopy was in October 2012 that was normal. ? ? ?Past Medical History:  ?Diagnosis Date  ? Anemia   ? Bilateral leg edema   ? C. difficile enteritis   ? CAD (coronary artery disease)   ? a. LHC 03/2006: EF 65%, mid LAD 40-50%, ostial D1 70-80%, normal circumflex, normal RCA;  b. Lexiscan Myoview 09/2010: No ischemia or scar, EF 75%;  c. Lex MV 5/14:  Normal, no ischemia, EF 71% // Myoview 08/2018: EF 43, normal perfusion; Intermediate Risk due to low EF (Echo 07/2018 with normal EF)   ? Cataract   ? bilateral-had surgery  ? Cellulitis of right leg   ? Hx - resolved  ? Chronic diastolic CHF (congestive heart failure) ([place])   ? Echo 8/14:  Mild LVH, EF 55-65%, normal wall motion, Tr AI, mild MR, mod TR, PASP 45 // 12/2014 Echo: EF 60-65%, nl wall motion, mildly dil RA/LA, PASP 95mHg.// Echo 07/2018: EF 60-65, mild LVH, mod diastolic dysfunction, normal RVSF, severe LAE, mod MAC,  mild MR, mild TR   ? Continuous urine leakage   ? Dermatomyositis ([place]   ? Deviated septum   ? Essential hypertension   ? Family history of adverse reaction to anesthesia   ? .' MY SON IS A DIFFICULT INTUBATION"  ? Gall stones   ? a. 08/2014 Abd U/S:  Large gallstone measuring 4.2 cm.   ? GERD (gastroesophageal reflux disease)   ? Gout   ? Hiatal hernia   ? History of pancreatitis   ? History of PFTs   ? a. PFTs 10/2012: normal.   ? Hyperlipidemia   ? Hypothyroidism   ? Internal hemorrhoids   ? Morbid obesity ([place]   ? Multifocal atrial tachycardia ([place] 08/10/2018  ? Noted during admission in 07/2018  ? Osteoarthritis   ? spine hips knees  ? PAF (paroxysmal atrial fibrillation) ([place]   ? a. CHA2DS2VASc = 5-->poor OAC candidate 2/2 falls. No problems now per patient 03/2016  ? Pneumonia   ? Seizures ([place]   ? as a baby  ? Shingles   ? Hx  ? Sleep apnea   ? Does not use CPAP  ? Spinal cord compression ([place] 02/21/2014  ? ?Past Surgical History:  ?Procedure Laterality Date  ? CATARACT EXTRACTION, BILATERAL Bilateral   ? COLONOSCOPY  03/2011  ? Implantable loop recorder placement  12/21/2018  ? Medtronic Reveal Linq model [id number](  SN [id number]) implanted by Dr Rayann Heman in office for AF management  ? NASAL SEPTUM SURGERY    ? TOTAL ABDOMINAL HYSTERECTOMY    ? VERTEBROPLASTY  Sept 7 and May 30, 2014  ? x 2, T11/T12  ? WISDOM TOOTH EXTRACTION    ? ? reports that she has never smoked. She has never used smokeless tobacco. She reports that she does not drink alcohol and does not use drugs. ?family history includes Colon cancer in her mother and paternal uncle; Coronary artery disease in her father; Emphysema in her father; Heart attack in her brother; Hypertension in her brother; Kidney disease in her brother. ?Allergies  ?Allergen Reactions  ? Butoconazole Itching, Rash and Other (See Comments)  ?  'Redness and swelling feverish ' 'worse symptoms '  ? Cephalexin Diarrhea  ?  Other reaction(s): diarrhea  ? Lipitor [Atorvastatin  Calcium] Other (See Comments)  ?  'Muscle weakness'    ? Butoconazole Nitrate (1 Dose)   ?  Other reaction(s): Unknown  ? Cortisone   ?  This medication causes confusion  ? Hydrocodone Other (See Comments)  ?  Other reaction(s): mental status changes  ? Sotalol Hcl   ?  Other reaction(s): Unknown  ? ? ?  ?Outpatient Encounter Medications as of 10/06/2021  ?Medication Sig  ? acetaminophen (TYLENOL) 500 MG tablet Take 500-1,000 mg by mouth every 6 (six) hours as needed for mild pain or fever.   ? albuterol (VENTOLIN HFA) 108 (90 Base) MCG/ACT inhaler   ? alendronate (FOSAMAX) 70 MG tablet Take 1 tablet by mouth every Tuesday.   ? allopurinol (ZYLOPRIM) 100 MG tablet Take 100 mg by mouth 2 (two) times daily.   ? aluminum hydroxide-magnesium carbonate (GAVISCON) 95-358 MG/15ML SUSP Take 15 mLs by mouth as needed for indigestion or heartburn.  ? CALCIUM PO Take 500 mg by mouth daily.  ? Cholecalciferol (VITAMIN D3) 2000 UNITS TABS Take 2,000 Units by mouth daily.  ? dextromethorphan (DELSYM) 30 MG/5ML liquid Take 60 mg by mouth as needed for cough.  ? dicyclomine (BENTYL) 10 MG capsule Take 1 capsule (10 mg total) by mouth 4 (four) times daily as needed for spasms.  ? ELIQUIS 5 MG TABS tablet TAKE 1 TABLET BY MOUTH TWICE A DAY  ? famotidine (PEPCID) 20 MG tablet Take 20 mg by mouth as needed.  ? folic acid (FOLVITE) 1 MG tablet Take 1 mg by mouth 4 (four) times daily.  ? furosemide (LASIX) 40 MG tablet Take 1 tablet (40 mg total) by mouth 2 (two) times daily.  ? guaiFENesin (MUCINEX) 600 MG 12 hr tablet Take 1 tablet (600 mg total) by mouth 2 (two) times daily.  ? hydrocortisone (ANUSOL-HC) 2.5 % rectal cream Place 1 application. rectally 2 (two) times daily.  ? levothyroxine (SYNTHROID) 88 MCG tablet Take 88 mcg by mouth daily.   ? loperamide (IMODIUM A-D) 2 MG tablet Take 2 mg by mouth 4 (four) times daily as needed for diarrhea or loose stools.  ? losartan (COZAAR) 25 MG tablet TAKE 1 TABLET BY MOUTH  DAILY (Patient  taking differently: Take 25 mg by mouth daily.)  ? methotrexate (RHEUMATREX) 2.5 MG tablet Take 12.5 mg by mouth every Wednesday.   ? metoprolol succinate (TOPROL-XL) 25 MG 24 hr tablet TAKE 1 AND 1/2 TABLETS BY  MOUTH TWICE DAILY (Patient taking differently: Take 37.5 mg by mouth in the morning and at bedtime.)  ? Multiple Vitamin (MULTIVITAMIN) tablet Take 1 tablet by mouth daily.  ?  OVER THE COUNTER MEDICATION Place 1 drop into both eyes daily as needed (for dry eyes. Advanced eye relief).  ? phenylephrine-shark liver oil-mineral oil-petrolatum (PREPARATION H) 0.25-3-14-71.9 % rectal ointment Place 1 application rectally as needed for hemorrhoids.  ? polyethylene glycol powder (GLYCOLAX/MIRALAX) 17 GM/SCOOP powder Take 17 g by mouth as needed.  ? potassium chloride SA (KLOR-CON M20) 20 MEQ tablet Take 1 tablet (20 mEq total) by mouth 2 (two) times daily.  ? predniSONE (DELTASONE) 5 MG tablet Take 5 mg by mouth daily with breakfast.  ? rosuvastatin (CRESTOR) 5 MG tablet Take 5 mg by mouth every other day.   ? saccharomyces boulardii (FLORASTOR) 250 MG capsule Take 250 mg by mouth 2 (two) times daily.  ? shark liver oil-cocoa butter (PREPARATION H) 0.25-3-85.5 % suppository Place 1 suppository rectally as needed for hemorrhoids.  ? sotalol (BETAPACE) 80 MG tablet TAKE 1 TABLET BY MOUTH  TWICE DAILY (Patient taking differently: Take 80 mg by mouth 2 (two) times daily.)  ? vitamin B-12 (CYANOCOBALAMIN) 1000 MCG tablet Take 1,000 mcg by mouth daily.  ? ?No facility-administered encounter medications on file as of 10/06/2021.  ? ? ?REVIEW OF SYSTEMS  : All other systems reviewed and negative except where noted in the History of Present Illness. ? ? ?PHYSICAL EXAM: ?BP 116/70   Pulse 68   Ht _0  (1.397 m)   Wt 227 lb 8 oz (103.2 kg)   BMI 52.88 kg/m?  ?General: Well developed white female in no acute distress ?Head: Normocephalic and atraumatic ?Eyes:  Sclerae anicteric, conjunctiva pink. ?Ears: Normal auditory  acuity ?Rectal: External exam showed no hemorrhoids, but there was a lot of erythema and skin excoriation/irritation extending far from the perianal area.  DRE revealed some soft stool, but no masses were felt.  Nicoletta Dress

## 2021-10-14 ENCOUNTER — Other Ambulatory Visit: Payer: Self-pay

## 2021-10-14 ENCOUNTER — Telehealth: Payer: Self-pay | Admitting: Gastroenterology

## 2021-10-14 LAB — CUP PACEART REMOTE DEVICE CHECK
Date Time Interrogation Session: 20230504001612
Implantable Pulse Generator Implant Date: 20200710

## 2021-10-14 NOTE — Telephone Encounter (Signed)
Patient was seen for OV on 4/25, and called to update [place], PA on hemorrhoids. She has been using prep h suppositories/cream, desitin, hydrocortisone cream, and recti care daily with no relief. She has been experiencing pain, burning, itching, and diarrhea. She would like to know if there is anything else she could do, and if she needs to continue the hydrocortisone cream.  ?

## 2021-10-14 NOTE — Telephone Encounter (Signed)
Inbound call from patient stating that she seen Janett Billow on 4/26 and she was told to call back 10 days later and speak to her nurse about the hemorrhoids she has. Please advise.  ?

## 2021-10-15 ENCOUNTER — Other Ambulatory Visit: Payer: Self-pay | Admitting: Gastroenterology

## 2021-10-15 ENCOUNTER — Other Ambulatory Visit: Payer: Self-pay

## 2021-10-15 MED ORDER — NYSTATIN-TRIAMCINOLONE 100000-0.1 UNIT/GM-% EX OINT: 1.0000 "application " | TOPICAL_OINTMENT | Freq: Two times a day (BID) | CUTANEOUS | 0 refills | Status: DC

## 2021-10-15 NOTE — Telephone Encounter (Signed)
Spoke with patient regarding Jessica's recommendations & she is aware that ointment has been sent to pharmacy.  ?

## 2021-10-18 ENCOUNTER — Ambulatory Visit (INDEPENDENT_AMBULATORY_CARE_PROVIDER_SITE_OTHER): Payer: Medicare Other

## 2021-10-18 DIAGNOSIS — I480 Paroxysmal atrial fibrillation: Secondary | ICD-10-CM | POA: Diagnosis not present

## 2021-11-04 NOTE — Progress Notes (Signed)
Carelink Summary Report / Loop [email or web address] 

## 2021-11-09 ENCOUNTER — Telehealth: Payer: Self-pay | Admitting: Student

## 2021-11-09 NOTE — Telephone Encounter (Signed)
Spoke with patient regarding elevated BP readings.  Patient reports BP last week 11/03/21 228/150, and this morning 212/118. Patient denies CP, SOB, visual changes, confusion. Patient does report headache since last week (Thursday 5/25) and some dizziness.   Patient reports taking an extra '25mg'$  of metoprolol to lower BP without success.   Spoke with DOD Dr. Quentin Ore who recommends patient increase losartan to '50mg'$  QD and follow-up with PCP ASAP.  Patient verbalized understanding and states new BP 2 hours after taking medication this morning is 186/122. Advised patient to take extra '25mg'$  of Losartan today (for total of '50mg'$  today) and then as advised per Dr. Quentin Ore take losartan '50mg'$  QD and F/U with PCP ASAP. Patient verbalized understanding again and expressed appreciation for assistance.

## 2021-11-09 NOTE — Telephone Encounter (Signed)
Pt c/o BP issue: STAT if pt c/o blurred vision, one-sided weakness or slurred speech  1. What are your last 5 BP readings?  11/03/21: 228/150 11/09/21: 212/118  2. Are you having any other symptoms (ex. Dizziness, headache, blurred vision, passed out)? Headache ( continuous since Thursday) but patient takes a tylenol and lays down to rest    3. What is your BP issue? Patient is concerned about her elevated BP   Patient also has gallbladder issues and disc problems in her back so she was not sure what was causing the BP to be elevated.  Patient said she is taking an extra metoprolol since she noticed her BP was high and she has been drinking more water.

## 2021-11-17 DIAGNOSIS — E559 Vitamin D deficiency, unspecified: Secondary | ICD-10-CM | POA: Diagnosis not present

## 2021-11-17 DIAGNOSIS — E7800 Pure hypercholesterolemia, unspecified: Secondary | ICD-10-CM | POA: Diagnosis not present

## 2021-11-17 DIAGNOSIS — R7309 Other abnormal glucose: Secondary | ICD-10-CM | POA: Diagnosis not present

## 2021-11-17 DIAGNOSIS — E039 Hypothyroidism, unspecified: Secondary | ICD-10-CM | POA: Diagnosis not present

## 2021-11-17 DIAGNOSIS — M109 Gout, unspecified: Secondary | ICD-10-CM | POA: Diagnosis not present

## 2021-11-17 DIAGNOSIS — Z79899 Other long term (current) drug therapy: Secondary | ICD-10-CM | POA: Diagnosis not present

## 2021-11-17 DIAGNOSIS — M810 Age-related osteoporosis without current pathological fracture: Secondary | ICD-10-CM | POA: Diagnosis not present

## 2021-11-17 DIAGNOSIS — Z7901 Long term (current) use of anticoagulants: Secondary | ICD-10-CM | POA: Diagnosis not present

## 2021-11-17 DIAGNOSIS — I509 Heart failure, unspecified: Secondary | ICD-10-CM | POA: Diagnosis not present

## 2021-11-17 DIAGNOSIS — I10 Essential (primary) hypertension: Secondary | ICD-10-CM | POA: Diagnosis not present

## 2021-11-22 ENCOUNTER — Ambulatory Visit (INDEPENDENT_AMBULATORY_CARE_PROVIDER_SITE_OTHER): Payer: Medicare Other

## 2021-11-22 DIAGNOSIS — I480 Paroxysmal atrial fibrillation: Secondary | ICD-10-CM

## 2021-11-24 LAB — CUP PACEART REMOTE DEVICE CHECK
Date Time Interrogation Session: 20230606002234
Implantable Pulse Generator Implant Date: 20200710

## 2021-12-01 DIAGNOSIS — H43811 Vitreous degeneration, right eye: Secondary | ICD-10-CM | POA: Diagnosis not present

## 2021-12-01 DIAGNOSIS — Z79899 Other long term (current) drug therapy: Secondary | ICD-10-CM | POA: Diagnosis not present

## 2021-12-01 DIAGNOSIS — H53022 Refractive amblyopia, left eye: Secondary | ICD-10-CM | POA: Diagnosis not present

## 2021-12-01 DIAGNOSIS — H16223 Keratoconjunctivitis sicca, not specified as Sjogren's, bilateral: Secondary | ICD-10-CM | POA: Diagnosis not present

## 2021-12-03 DIAGNOSIS — E7800 Pure hypercholesterolemia, unspecified: Secondary | ICD-10-CM | POA: Diagnosis not present

## 2021-12-03 DIAGNOSIS — E039 Hypothyroidism, unspecified: Secondary | ICD-10-CM | POA: Diagnosis not present

## 2021-12-03 DIAGNOSIS — M109 Gout, unspecified: Secondary | ICD-10-CM | POA: Diagnosis not present

## 2021-12-03 DIAGNOSIS — Z79899 Other long term (current) drug therapy: Secondary | ICD-10-CM | POA: Diagnosis not present

## 2021-12-03 DIAGNOSIS — M810 Age-related osteoporosis without current pathological fracture: Secondary | ICD-10-CM | POA: Diagnosis not present

## 2021-12-03 DIAGNOSIS — E559 Vitamin D deficiency, unspecified: Secondary | ICD-10-CM | POA: Diagnosis not present

## 2021-12-03 DIAGNOSIS — I10 Essential (primary) hypertension: Secondary | ICD-10-CM | POA: Diagnosis not present

## 2021-12-03 DIAGNOSIS — R7309 Other abnormal glucose: Secondary | ICD-10-CM | POA: Diagnosis not present

## 2021-12-03 DIAGNOSIS — Z7901 Long term (current) use of anticoagulants: Secondary | ICD-10-CM | POA: Diagnosis not present

## 2021-12-03 DIAGNOSIS — Z0000 Encounter for general adult medical examination without abnormal findings: Secondary | ICD-10-CM | POA: Diagnosis not present

## 2021-12-08 NOTE — Progress Notes (Signed)
[facility] Summary Report / Loop Recorder 

## 2021-12-16 DIAGNOSIS — I8311 Varicose veins of right lower extremity with inflammation: Secondary | ICD-10-CM | POA: Diagnosis not present

## 2021-12-16 DIAGNOSIS — I872 Venous insufficiency (chronic) (peripheral): Secondary | ICD-10-CM | POA: Diagnosis not present

## 2021-12-16 DIAGNOSIS — I8312 Varicose veins of left lower extremity with inflammation: Secondary | ICD-10-CM | POA: Diagnosis not present

## 2021-12-16 DIAGNOSIS — L812 Freckles: Secondary | ICD-10-CM | POA: Diagnosis not present

## 2021-12-16 DIAGNOSIS — L308 Other specified dermatitis: Secondary | ICD-10-CM | POA: Diagnosis not present

## 2021-12-16 DIAGNOSIS — D0462 Carcinoma in situ of skin of left upper limb, including shoulder: Secondary | ICD-10-CM | POA: Diagnosis not present

## 2021-12-16 DIAGNOSIS — L280 Lichen simplex chronicus: Secondary | ICD-10-CM | POA: Diagnosis not present

## 2021-12-16 DIAGNOSIS — D485 Neoplasm of uncertain behavior of skin: Secondary | ICD-10-CM | POA: Diagnosis not present

## 2021-12-16 DIAGNOSIS — L570 Actinic keratosis: Secondary | ICD-10-CM | POA: Diagnosis not present

## 2021-12-16 DIAGNOSIS — Z85828 Personal history of other malignant neoplasm of skin: Secondary | ICD-10-CM | POA: Diagnosis not present

## 2021-12-16 DIAGNOSIS — L821 Other seborrheic keratosis: Secondary | ICD-10-CM | POA: Diagnosis not present

## 2021-12-17 DIAGNOSIS — M3390 Dermatopolymyositis, unspecified, organ involvement unspecified: Secondary | ICD-10-CM | POA: Diagnosis not present

## 2021-12-17 DIAGNOSIS — M5136 Other intervertebral disc degeneration, lumbar region: Secondary | ICD-10-CM | POA: Diagnosis not present

## 2021-12-17 DIAGNOSIS — K219 Gastro-esophageal reflux disease without esophagitis: Secondary | ICD-10-CM | POA: Diagnosis not present

## 2021-12-17 DIAGNOSIS — I480 Paroxysmal atrial fibrillation: Secondary | ICD-10-CM | POA: Diagnosis not present

## 2021-12-17 DIAGNOSIS — Z79899 Other long term (current) drug therapy: Secondary | ICD-10-CM | POA: Diagnosis not present

## 2021-12-17 DIAGNOSIS — Z0000 Encounter for general adult medical examination without abnormal findings: Secondary | ICD-10-CM | POA: Diagnosis not present

## 2021-12-17 DIAGNOSIS — M109 Gout, unspecified: Secondary | ICD-10-CM | POA: Diagnosis not present

## 2021-12-17 DIAGNOSIS — Z23 Encounter for immunization: Secondary | ICD-10-CM | POA: Diagnosis not present

## 2021-12-17 DIAGNOSIS — I700 Atherosclerosis of aorta: Secondary | ICD-10-CM | POA: Diagnosis not present

## 2021-12-17 DIAGNOSIS — Z7901 Long term (current) use of anticoagulants: Secondary | ICD-10-CM | POA: Diagnosis not present

## 2021-12-17 DIAGNOSIS — I509 Heart failure, unspecified: Secondary | ICD-10-CM | POA: Diagnosis not present

## 2021-12-23 LAB — CUP PACEART REMOTE DEVICE CHECK
Date Time Interrogation Session: 20230709002215
Implantable Pulse Generator Implant Date: 20200710

## 2021-12-27 ENCOUNTER — Ambulatory Visit (INDEPENDENT_AMBULATORY_CARE_PROVIDER_SITE_OTHER): Payer: Medicare Other

## 2021-12-27 DIAGNOSIS — I480 Paroxysmal atrial fibrillation: Secondary | ICD-10-CM

## 2022-01-04 ENCOUNTER — Other Ambulatory Visit: Payer: Self-pay | Admitting: Internal Medicine

## 2022-01-11 ENCOUNTER — Other Ambulatory Visit (HOSPITAL_COMMUNITY): Payer: Self-pay | Admitting: Internal Medicine

## 2022-01-11 DIAGNOSIS — I10 Essential (primary) hypertension: Secondary | ICD-10-CM

## 2022-01-26 NOTE — Progress Notes (Signed)
[facility] Summary Report / Loop Recorder 

## 2022-01-27 DIAGNOSIS — M3390 Dermatopolymyositis, unspecified, organ involvement unspecified: Secondary | ICD-10-CM | POA: Diagnosis not present

## 2022-01-27 DIAGNOSIS — M150 Primary generalized (osteo)arthritis: Secondary | ICD-10-CM | POA: Diagnosis not present

## 2022-01-27 DIAGNOSIS — M549 Dorsalgia, unspecified: Secondary | ICD-10-CM | POA: Diagnosis not present

## 2022-01-27 DIAGNOSIS — Z7952 Long term (current) use of systemic steroids: Secondary | ICD-10-CM | POA: Diagnosis not present

## 2022-01-27 DIAGNOSIS — M109 Gout, unspecified: Secondary | ICD-10-CM | POA: Diagnosis not present

## 2022-01-27 DIAGNOSIS — M810 Age-related osteoporosis without current pathological fracture: Secondary | ICD-10-CM | POA: Diagnosis not present

## 2022-01-27 DIAGNOSIS — M1990 Unspecified osteoarthritis, unspecified site: Secondary | ICD-10-CM | POA: Diagnosis not present

## 2022-01-27 DIAGNOSIS — Z79899 Other long term (current) drug therapy: Secondary | ICD-10-CM | POA: Diagnosis not present

## 2022-01-27 DIAGNOSIS — M5136 Other intervertebral disc degeneration, lumbar region: Secondary | ICD-10-CM | POA: Diagnosis not present

## 2022-01-31 ENCOUNTER — Ambulatory Visit (INDEPENDENT_AMBULATORY_CARE_PROVIDER_SITE_OTHER): Payer: Medicare Other

## 2022-01-31 DIAGNOSIS — I480 Paroxysmal atrial fibrillation: Secondary | ICD-10-CM

## 2022-02-02 LAB — CUP PACEART REMOTE DEVICE CHECK
Date Time Interrogation Session: 20230820231539
Implantable Pulse Generator Implant Date: 20200710

## 2022-02-23 DIAGNOSIS — Z85828 Personal history of other malignant neoplasm of skin: Secondary | ICD-10-CM | POA: Diagnosis not present

## 2022-02-23 DIAGNOSIS — D0462 Carcinoma in situ of skin of left upper limb, including shoulder: Secondary | ICD-10-CM | POA: Diagnosis not present

## 2022-02-28 NOTE — Progress Notes (Signed)
[facility] Summary Report / Loop Recorder 

## 2022-03-07 ENCOUNTER — Ambulatory Visit (INDEPENDENT_AMBULATORY_CARE_PROVIDER_SITE_OTHER): Payer: Medicare Other

## 2022-03-07 DIAGNOSIS — I480 Paroxysmal atrial fibrillation: Secondary | ICD-10-CM | POA: Diagnosis not present

## 2022-03-08 LAB — CUP PACEART REMOTE DEVICE CHECK
Date Time Interrogation Session: 20230922231054
Implantable Pulse Generator Implant Date: 20200710

## 2022-03-10 ENCOUNTER — Other Ambulatory Visit: Payer: Self-pay | Admitting: Internal Medicine

## 2022-03-10 DIAGNOSIS — I480 Paroxysmal atrial fibrillation: Secondary | ICD-10-CM

## 2022-03-10 NOTE — Telephone Encounter (Signed)
Prescription refill request for Eliquis received. Indication: Afib  Last office visit: 09/21/21 Chalmers Cater) Scr: 0.99 (09/21/21) Age: 76 Weight: 103.2kg  Appropriate dose and refill sent to requested pharmacy.

## 2022-03-11 DIAGNOSIS — Z23 Encounter for immunization: Secondary | ICD-10-CM | POA: Diagnosis not present

## 2022-03-23 NOTE — Progress Notes (Signed)
[facility] Summary Report / Loop Recorder 

## 2022-04-11 ENCOUNTER — Ambulatory Visit (INDEPENDENT_AMBULATORY_CARE_PROVIDER_SITE_OTHER): Payer: Medicare Other

## 2022-04-11 DIAGNOSIS — I480 Paroxysmal atrial fibrillation: Secondary | ICD-10-CM | POA: Diagnosis not present

## 2022-04-11 LAB — CUP PACEART REMOTE DEVICE CHECK
Date Time Interrogation Session: 20231025231824
Implantable Pulse Generator Implant Date: 20200710

## 2022-04-14 DIAGNOSIS — M810 Age-related osteoporosis without current pathological fracture: Secondary | ICD-10-CM | POA: Diagnosis not present

## 2022-04-14 DIAGNOSIS — Z780 Asymptomatic menopausal state: Secondary | ICD-10-CM | POA: Diagnosis not present

## 2022-04-14 DIAGNOSIS — Z1231 Encounter for screening mammogram for malignant neoplasm of breast: Secondary | ICD-10-CM | POA: Diagnosis not present

## 2022-05-03 ENCOUNTER — Ambulatory Visit: Payer: Medicare Other | Attending: Cardiology | Admitting: Cardiology

## 2022-05-03 ENCOUNTER — Encounter: Payer: Self-pay | Admitting: Cardiology

## 2022-05-03 VITALS — BP 124/68 | HR 62 | Ht <= 58 in | Wt 238.0 lb

## 2022-05-03 DIAGNOSIS — I480 Paroxysmal atrial fibrillation: Secondary | ICD-10-CM

## 2022-05-03 DIAGNOSIS — D6869 Other thrombophilia: Secondary | ICD-10-CM | POA: Diagnosis not present

## 2022-05-03 NOTE — Progress Notes (Signed)
Electrophysiology Office Note   Date:  05/03/2022   ID:  [name], DOB [birth year], MRN [id number]  PCP:  [name], DO  Cardiologist:   Primary Electrophysiologist:  Alyjah Lovingood Meredith Leeds, MD    Chief Complaint: AF   History of Present Illness: [name] is a 76 y.o. female who is being seen today for the evaluation of AF at the request of [name], DO. Presenting today for electrophysiology evaluation.  She has a history significant for nonobstructive coronary artery disease, diastolic heart failure, paroxysmal atrial fibrillation, morbid obesity, OSA, hypertension, hyperlipidemia.  Today, she denies symptoms of palpitations, chest pain, shortness of breath, orthopnea, PND, lower extremity edema, claudication, dizziness, presyncope, syncope, bleeding, or neurologic sequela. The patient is tolerating medications without difficulties.  She is having multiple issues including issues with her hearing aids, her gallbladder.  Despite that, she feels that she is no longer having any cardiac issues.  She is happy to learn that she is in normal rhythm and has had minimal atrial fibrillation.   Past Medical History:  Diagnosis Date   Anemia    Bilateral leg edema    C. difficile enteritis    CAD (coronary artery disease)    a. LHC 03/2006: EF 65%, mid LAD 40-50%, ostial D1 70-80%, normal circumflex, normal RCA;  b. Lexiscan Myoview 09/2010: No ischemia or scar, EF 75%;  c. Lex MV 5/14:  Normal, no ischemia, EF 71% // Myoview 08/2018: EF 43, normal perfusion; Intermediate Risk due to low EF (Echo 07/2018 with normal EF)    Cataract    bilateral-had surgery   Cellulitis of right leg    Hx - resolved   Chronic diastolic CHF (congestive heart failure) ([place])    Echo 8/14:  Mild LVH, EF 55-65%, normal wall motion, Tr AI, mild MR, mod TR, PASP 45 // 12/2014 Echo: EF 60-65%, nl wall motion, mildly dil RA/LA, PASP 75mHg.// Echo 07/2018: EF 60-65, mild LVH, mod diastolic dysfunction, normal  RVSF, severe LAE, mod MAC, mild MR, mild TR    Continuous urine leakage    Dermatomyositis (HCC)    Deviated septum    Essential hypertension    Family history of adverse reaction to anesthesia    .' MY SON IS A DIFFICULT INTUBATION"   Gall stones    a. 08/2014 Abd U/S:  Large gallstone measuring 4.2 cm.    GERD (gastroesophageal reflux disease)    Gout    Hiatal hernia    History of pancreatitis    History of PFTs    a. PFTs 10/2012: normal.    Hyperlipidemia    Hypothyroidism    Internal hemorrhoids    Morbid obesity ([place]    Multifocal atrial tachycardia 08/10/2018   Noted during admission in 07/2018   Osteoarthritis    spine hips knees   PAF (paroxysmal atrial fibrillation) ([facility]    a. CHA2DS2VASc = 5-->poor OAC candidate 2/2 falls. No problems now per patient 03/2016   Pneumonia    Seizures ([facility]    as a baby   Shingles    Hx   Sleep apnea    Does not use CPAP   Spinal cord compression ([facility] 02/21/2014   Past Surgical History:  Procedure Laterality Date   CATARACT EXTRACTION, BILATERAL Bilateral    COLONOSCOPY  03/2011   Implantable loop recorder placement  12/21/2018   Medtronic Reveal LCairomodel [id number] (SN [id number]) implanted by Dr ARayann Hemanin office for AF management   NASAL SEPTUM  SURGERY     TOTAL ABDOMINAL HYSTERECTOMY     VERTEBROPLASTY  Sept 7 and May 30, 2014   x 2, T11/T12   WISDOM TOOTH EXTRACTION       Current Outpatient Medications  Medication Sig Dispense Refill   acetaminophen (TYLENOL) 500 MG tablet Take 500-1,000 mg by mouth every 6 (six) hours as needed for mild pain or fever.      albuterol (VENTOLIN HFA) 108 (90 Base) MCG/ACT inhaler      alendronate (FOSAMAX) 70 MG tablet Take 1 tablet by mouth every Tuesday.      allopurinol (ZYLOPRIM) 100 MG tablet Take 100 mg by mouth 2 (two) times daily.      aluminum hydroxide-magnesium carbonate (GAVISCON) 95-358 MG/15ML SUSP Take 15 mLs by mouth as needed for indigestion or heartburn.     CALCIUM PO  Take 500 mg by mouth daily.     Cholecalciferol (VITAMIN D3) 2000 UNITS TABS Take 2,000 Units by mouth daily.     dicyclomine (BENTYL) 10 MG capsule Take 1 capsule (10 mg total) by mouth 4 (four) times daily as needed for spasms. 360 capsule 3   ELIQUIS 5 MG TABS tablet TAKE 1 TABLET BY MOUTH TWICE A DAY 60 tablet 5   famotidine (PEPCID) 20 MG tablet Take 20 mg by mouth as needed.     folic acid (FOLVITE) 1 MG tablet Take 1 mg by mouth 4 (four) times daily.     furosemide (LASIX) 40 MG tablet Take 1 tablet (40 mg total) by mouth 2 (two) times daily. 120 tablet 5   guaiFENesin (MUCINEX) 600 MG 12 hr tablet Take 1 tablet (600 mg total) by mouth 2 (two) times daily. 30 tablet 0   hydrocortisone (ANUSOL-HC) 2.5 % rectal cream Place 1 application. rectally 2 (two) times daily. 30 g 1   levothyroxine (SYNTHROID) 88 MCG tablet Take 88 mcg by mouth daily.      loperamide (IMODIUM A-D) 2 MG tablet Take 2 mg by mouth 4 (four) times daily as needed for diarrhea or loose stools.     losartan (COZAAR) 25 MG tablet Take 1 tablet (25 mg total) by mouth daily. 90 tablet 3   methotrexate (RHEUMATREX) 2.5 MG tablet Take 12.5 mg by mouth every Wednesday.   3   metoprolol succinate (TOPROL-XL) 25 MG 24 hr tablet TAKE 1 AND 1/2 TABLETS BY  MOUTH TWICE DAILY 270 tablet 2   Multiple Vitamin (MULTIVITAMIN) tablet Take 1 tablet by mouth daily.     nystatin-triamcinolone ointment (MYCOLOG) APPLY 1 APPLICATION. TOPICALLY 2 (TWO) TIMES DAILY. 30 g 0   OVER THE COUNTER MEDICATION Place 1 drop into both eyes daily as needed (for dry eyes. Advanced eye relief).     phenylephrine-shark liver oil-mineral oil-petrolatum (PREPARATION H) 0.25-3-14-71.9 % rectal ointment Place 1 application rectally as needed for hemorrhoids.     polyethylene glycol powder (GLYCOLAX/MIRALAX) 17 GM/SCOOP powder Take 17 g by mouth as needed.     potassium chloride SA (KLOR-CON M20) 20 MEQ tablet Take 1 tablet (20 mEq total) by mouth 2 (two) times  daily. 60 tablet 11   predniSONE (DELTASONE) 5 MG tablet Take 5 mg by mouth daily with breakfast.     rosuvastatin (CRESTOR) 5 MG tablet Take 5 mg by mouth every other day.      saccharomyces boulardii (FLORASTOR) 250 MG capsule Take 250 mg by mouth 2 (two) times daily.     shark liver oil-cocoa butter (PREPARATION H) 0.25-3-85.5 % suppository Place  1 suppository rectally as needed for hemorrhoids.     sotalol (BETAPACE) 80 MG tablet TAKE 1 TABLET BY MOUTH  TWICE DAILY 180 tablet 2   vitamin B-12 (CYANOCOBALAMIN) 1000 MCG tablet Take 1,000 mcg by mouth daily.     No current facility-administered medications for this visit.    Allergies:   Butoconazole, Cephalexin, Lipitor [atorvastatin calcium], Butoconazole nitrate (1 dose), Cortisone, Hydrocodone, and Sotalol hcl   Social History:  The patient  reports that she has never smoked. She has never used smokeless tobacco. She reports that she does not drink alcohol and does not use drugs.   Family History:  The patient's family history includes Colon cancer in her mother and paternal uncle; Coronary artery disease in her father; Emphysema in her father; Heart attack in her brother; Hypertension in her brother; Kidney disease in her brother.    ROS:  Please see the history of present illness.   Otherwise, review of systems is positive for none.   All other systems are reviewed and negative.    PHYSICAL EXAM: VS:  BP 124/68   Pulse 62   Ht _0  (1.397 m)   Wt 238 lb (108 kg)   SpO2 96%   BMI 55.32 kg/m  , BMI Body mass index is 55.32 kg/m. GEN: Well nourished, well developed, in no acute distress  HEENT: normal  Neck: no JVD, carotid bruits, or masses Cardiac: RRR; no murmurs, rubs, or gallops,no edema  Respiratory:  clear to auscultation bilaterally, normal work of breathing GI: soft, nontender, nondistended, + BS MS: no deformity or atrophy  Skin: warm and dry, device pocket is well healed Neuro:  Strength and sensation are  intact Psych: euthymic mood, full affect  EKG:  EKG is ordered today. Personal review of the ekg ordered shows sinus rhythm, rate 62  Device interrogation is reviewed today in detail.  See PaceArt for details.   Recent Labs: 06/05/2021: B Natriuretic Peptide 303.7; TSH 0.424 06/10/2021: ALT 18; Hemoglobin 14.3; Platelets 191 09/21/2021: BUN 24; Creatinine, Ser 0.99; Magnesium 2.1; Potassium 4.1; Sodium 146    Lipid Panel  No results found for: "CHOL", "TRIG", "HDL", "CHOLHDL", "VLDL", "LDLCALC", "LDLDIRECT"   Wt Readings from Last 3 Encounters:  05/03/22 238 lb (108 kg)  10/06/21 227 lb 8 oz (103.2 kg)  09/21/21 226 lb 3.2 oz (102.6 kg)      Other studies Reviewed: Additional studies/ records that were reviewed today include: TTE 07/07/21  Review of the above records today demonstrates:   1. Left ventricular ejection fraction, by estimation, is 70 to 75%. The  left ventricle has hyperdynamic function. The left ventricle has no  regional wall motion abnormalities. There is mild concentric left  ventricular hypertrophy. Left ventricular  diastolic parameters are consistent with Grade II diastolic dysfunction  (pseudonormalization). Elevated left ventricular end-diastolic pressure.   2. Right ventricular systolic function is normal. The right ventricular  size is normal. There is moderately elevated pulmonary artery systolic  pressure. The estimated right ventricular systolic pressure is 62.8 mmHg.   3. Left atrial size was mild to moderately dilated.   4. The mitral valve is abnormal. Mild to moderate mitral valve  regurgitation. Moderate mitral annular calcification.   5. The aortic valve is tricuspid. Aortic valve regurgitation is not  visualized.   6. The inferior vena cava is normal in size with greater than 50%  respiratory variability, suggesting right atrial pressure of 3 mmHg.    ASSESSMENT AND PLAN:  1.  Paroxysmal atrial fibrillation: CHA2DS2-VASc of 4.   Currently on Eliquis 5 mg twice daily.  Status post ILR implant.  Minimal episodes of atrial fibrillation on her monitor.  We Aleyah Balik continue with current management.  2.  Obstructive sleep apnea: Does not wear CPAP.  Was severe at most recent sleep study.  3.  Hypertension: Currently well controlled  4.  Morbid obesity: Lifestyle modification encouraged Body mass index is 55.32 kg/m.  5.  Coronary disease: No current chest pain.  6.  Chronic diastolic heart failure: No obvious volume overload  7.  Secondary hypercoagulable state: Currently on Eliquis for atrial fibrillation as above   Current medicines are reviewed at length with the patient today.   The patient does not have concerns regarding her medicines.  The following changes were made today:  none  Labs/ tests ordered today include:  Orders Placed This Encounter  Procedures   EKG 12-Lead     Disposition:   FU with Meyer Arora 1 year  Signed, Zacharie Portner Meredith Leeds, MD  05/03/2022 3:22 PM     [facility] [address] [place] [address] [phone] (office) [phone] (fax)

## 2022-05-09 ENCOUNTER — Telehealth: Payer: Self-pay

## 2022-05-09 NOTE — Telephone Encounter (Signed)
ILR reached RRT 05/04/22.  Patient advised battery is no longer working. Discussed options to leave device in or explant. Patient would like to leave device in.   -Marked "I" in Paceart. -Canceled future remotes -Discontinued from [place].

## 2022-05-13 DIAGNOSIS — R6889 Other general symptoms and signs: Secondary | ICD-10-CM | POA: Diagnosis not present

## 2022-05-13 DIAGNOSIS — K2960 Other gastritis without bleeding: Secondary | ICD-10-CM | POA: Diagnosis not present

## 2022-05-13 DIAGNOSIS — R059 Cough, unspecified: Secondary | ICD-10-CM | POA: Diagnosis not present

## 2022-05-14 NOTE — Progress Notes (Signed)
[facility] Summary Report / Loop Recorder 

## 2022-05-26 DIAGNOSIS — L570 Actinic keratosis: Secondary | ICD-10-CM | POA: Diagnosis not present

## 2022-05-26 DIAGNOSIS — Z85828 Personal history of other malignant neoplasm of skin: Secondary | ICD-10-CM | POA: Diagnosis not present

## 2022-06-10 DIAGNOSIS — M549 Dorsalgia, unspecified: Secondary | ICD-10-CM | POA: Diagnosis not present

## 2022-06-10 DIAGNOSIS — M1990 Unspecified osteoarthritis, unspecified site: Secondary | ICD-10-CM | POA: Diagnosis not present

## 2022-06-10 DIAGNOSIS — M3390 Dermatopolymyositis, unspecified, organ involvement unspecified: Secondary | ICD-10-CM | POA: Diagnosis not present

## 2022-06-10 DIAGNOSIS — M109 Gout, unspecified: Secondary | ICD-10-CM | POA: Diagnosis not present

## 2022-06-10 DIAGNOSIS — Z79899 Other long term (current) drug therapy: Secondary | ICD-10-CM | POA: Diagnosis not present

## 2022-06-10 DIAGNOSIS — M150 Primary generalized (osteo)arthritis: Secondary | ICD-10-CM | POA: Diagnosis not present

## 2022-06-10 DIAGNOSIS — M5136 Other intervertebral disc degeneration, lumbar region: Secondary | ICD-10-CM | POA: Diagnosis not present

## 2022-06-10 DIAGNOSIS — M810 Age-related osteoporosis without current pathological fracture: Secondary | ICD-10-CM | POA: Diagnosis not present

## 2022-06-10 DIAGNOSIS — Z7952 Long term (current) use of systemic steroids: Secondary | ICD-10-CM | POA: Diagnosis not present

## 2022-06-29 ENCOUNTER — Other Ambulatory Visit: Payer: Self-pay | Admitting: Student

## 2022-07-12 ENCOUNTER — Other Ambulatory Visit: Payer: Self-pay | Admitting: Student

## 2022-07-21 ENCOUNTER — Encounter (HOSPITAL_COMMUNITY): Payer: Self-pay | Admitting: *Deleted

## 2022-08-11 ENCOUNTER — Other Ambulatory Visit: Payer: Self-pay

## 2022-08-11 DIAGNOSIS — I480 Paroxysmal atrial fibrillation: Secondary | ICD-10-CM

## 2022-08-11 MED ORDER — APIXABAN 5 MG PO TABS: 5.0000 mg | ORAL_TABLET | Freq: Two times a day (BID) | ORAL | 5 refills | Status: DC

## 2022-08-11 NOTE — Telephone Encounter (Signed)
Prescription refill request for Eliquis received. Indication: Afib Last office visit: 05/03/22 ([place])  Scr: 0.99 (09/21/21)  Age: 77 Weight: 108kg  Appropriate dose. Refill sent.

## 2022-10-13 DIAGNOSIS — M5136 Other intervertebral disc degeneration, lumbar region: Secondary | ICD-10-CM | POA: Diagnosis not present

## 2022-10-13 DIAGNOSIS — M1990 Unspecified osteoarthritis, unspecified site: Secondary | ICD-10-CM | POA: Diagnosis not present

## 2022-10-13 DIAGNOSIS — M109 Gout, unspecified: Secondary | ICD-10-CM | POA: Diagnosis not present

## 2022-10-13 DIAGNOSIS — Z7952 Long term (current) use of systemic steroids: Secondary | ICD-10-CM | POA: Diagnosis not present

## 2022-10-13 DIAGNOSIS — Z79899 Other long term (current) drug therapy: Secondary | ICD-10-CM | POA: Diagnosis not present

## 2022-10-13 DIAGNOSIS — M810 Age-related osteoporosis without current pathological fracture: Secondary | ICD-10-CM | POA: Diagnosis not present

## 2022-10-13 DIAGNOSIS — M150 Primary generalized (osteo)arthritis: Secondary | ICD-10-CM | POA: Diagnosis not present

## 2022-10-13 DIAGNOSIS — M549 Dorsalgia, unspecified: Secondary | ICD-10-CM | POA: Diagnosis not present

## 2022-10-13 DIAGNOSIS — M3390 Dermatopolymyositis, unspecified, organ involvement unspecified: Secondary | ICD-10-CM | POA: Diagnosis not present

## 2022-11-30 ENCOUNTER — Ambulatory Visit (INDEPENDENT_AMBULATORY_CARE_PROVIDER_SITE_OTHER): Payer: Medicare Other

## 2022-11-30 ENCOUNTER — Ambulatory Visit
Admission: RE | Admit: 2022-11-30 | Discharge: 2022-11-30 | Disposition: A | Discharge status: Home / Self Care | Payer: Medicare Other | Source: Ambulatory Visit | Attending: Emergency Medicine | Admitting: Emergency Medicine

## 2022-11-30 VITALS — BP 159/93 | HR 75 | Temp 97.7°F | Resp 15

## 2022-11-30 DIAGNOSIS — R0781 Pleurodynia: Secondary | ICD-10-CM | POA: Diagnosis not present

## 2022-11-30 DIAGNOSIS — M25552 Pain in left hip: Secondary | ICD-10-CM

## 2022-11-30 DIAGNOSIS — W19XXXA Unspecified fall, initial encounter: Secondary | ICD-10-CM | POA: Diagnosis not present

## 2022-11-30 DIAGNOSIS — R0789 Other chest pain: Secondary | ICD-10-CM | POA: Diagnosis not present

## 2022-11-30 NOTE — ED Provider Notes (Signed)
[facility]    CSN: [id number] Arrival date & time: 11/30/22  1120      History   Chief Complaint No chief complaint on file.   HPI [name] is a 77 y.o. female.  Patient reports left sided hip pain, rib pain and lateral trunk pain for about 2 weeks.  After discussion, patient and husband remember that she did have a slip in the bathtub around the time that the pain started.  She slipped on the stool she uses to enter and exit the tub and landed hard on the edge of the bathtub onto her bottom.  Since then, she has had this pain.  She has tried treating it with topical Voltaren and dicyclomine (that she takes for a gallstone that cannot be removed as she is reportedly a poor surgical candidate).  These medicines have temporarily relieved her symptoms but they later recur after the medicine wears off.  Denies change in eating habits, denies change in urinary or bowel habits, denies numbness or tingling.  Is worried about having a fracture  HPI  Past Medical History:  Diagnosis Date   Anemia    Bilateral leg edema    C. difficile enteritis    CAD (coronary artery disease)    a. LHC 03/2006: EF 65%, mid LAD 40-50%, ostial D1 70-80%, normal circumflex, normal RCA;  b. Lexiscan Myoview 09/2010: No ischemia or scar, EF 75%;  c. Lex MV 5/14:  Normal, no ischemia, EF 71% // Myoview 08/2018: EF 43, normal perfusion; Intermediate Risk due to low EF (Echo 07/2018 with normal EF)    Cataract    bilateral-had surgery   Cellulitis of right leg    Hx - resolved   Chronic diastolic CHF (congestive heart failure) (HCC)    Echo 8/14:  Mild LVH, EF 55-65%, normal wall motion, Tr AI, mild MR, mod TR, PASP 45 // 12/2014 Echo: EF 60-65%, nl wall motion, mildly dil RA/LA, PASP 6mmHg.// Echo 07/2018: EF 60-65, mild LVH, mod diastolic dysfunction, normal RVSF, severe LAE, mod MAC, mild MR, mild TR    Continuous urine leakage    Dermatomyositis (HCC)    Deviated septum    Essential hypertension     Family history of adverse reaction to anesthesia    .' MY SON IS A DIFFICULT INTUBATION"   Gall stones    a. 08/2014 Abd U/S:  Large gallstone measuring 4.2 cm.    GERD (gastroesophageal reflux disease)    Gout    Hiatal hernia    History of pancreatitis    History of PFTs    a. PFTs 10/2012: normal.    Hyperlipidemia    Hypothyroidism    Internal hemorrhoids    Morbid obesity (HCC)    Multifocal atrial tachycardia 08/10/2018   Noted during admission in 07/2018   Osteoarthritis    spine hips knees   PAF (paroxysmal atrial fibrillation) (HCC)    a. CHA2DS2VASc = 5-->poor OAC candidate 2/2 falls. No problems now per patient 03/2016   Pneumonia    Seizures (HCC)    as a baby   Shingles    Hx   Sleep apnea    Does not use CPAP   Spinal cord compression (HCC) 02/21/2014    Patient Active Problem List   Diagnosis Date Noted   Internal hemorrhoids 10/06/2021   Perianal rash 10/06/2021   Allergic rhinitis 06/24/2021   Chronic gouty arthritis 06/24/2021   Congestive heart failure (HCC) 06/24/2021   Gastroesophageal reflux  disease 06/24/2021   Gout 06/24/2021   Hardening of the aorta (main artery of the heart) (HCC) 06/24/2021   Obstructive sleep apnea syndrome 06/24/2021   Primary osteoarthritis 06/24/2021   Osteoporosis 06/24/2021   Recurrent major depression in remission (HCC) 06/24/2021   Vitamin D deficiency 06/24/2021   CAP (community acquired pneumonia) 06/05/2021   Acute respiratory failure with hypoxia (HCC) 06/05/2021   Elevated troponin 06/05/2021   Hypothyroid 06/05/2021   Leukocytosis 06/05/2021   Secondary hypercoagulable state (HCC) 06/24/2019   Multifocal atrial tachycardia 08/10/2018   Chest pain    Other intervertebral disc degeneration, lumbar region 05/29/2018   Cellulitis, bilateral LE but R>L, abd wall  08/13/2015   Sepsis due to cellulitis (HCC) 08/13/2015   Acute kidney injury (HCC) 08/13/2015   Hypokalemia 08/13/2015   History of iron deficiency  anemia 08/13/2015   Thrombocytopenia (HCC) 08/13/2015   Sepsis (HCC) 08/13/2015   Chronic diastolic heart failure (HCC)    Pure hypercholesterolemia    PAF (paroxysmal atrial fibrillation) (HCC)    Hypertension    Long term (current) use of systemic steroids 02/18/2014   Dermatomyositis (HCC) 02/18/2014   Morbid obesity with BMI of 50.0-59.9, adult (HCC) 12/09/2013   Coronary artery disease 09/25/2008   LEG EDEMA, BILATERAL 09/20/2008    Past Surgical History:  Procedure Laterality Date   CATARACT EXTRACTION, BILATERAL Bilateral    COLONOSCOPY  03/2011   Implantable loop recorder placement  12/21/2018   Medtronic Reveal [place] model LNQ11 (SN [id number]) implanted by Dr Johney Frame in office for AF management   NASAL SEPTUM SURGERY     TOTAL ABDOMINAL HYSTERECTOMY     VERTEBROPLASTY  Sept 7 and May 30, 2014   x 2, T11/T12   WISDOM TOOTH EXTRACTION      OB History   No obstetric history on file.      Home Medications    Prior to Admission medications   Medication Sig Start Date End Date Taking? Authorizing Provider  acetaminophen (TYLENOL) 500 MG tablet Take 500-1,000 mg by mouth every 6 (six) hours as needed for mild pain or fever.     [provider]  albuterol (VENTOLIN HFA) 108 (90 Base) MCG/ACT inhaler     [provider]  alendronate (FOSAMAX) 70 MG tablet Take 1 tablet by mouth every Tuesday.  04/01/15   [provider]  allopurinol (ZYLOPRIM) 100 MG tablet Take 100 mg by mouth 2 (two) times daily.  01/14/16   [provider]  aluminum hydroxide-magnesium carbonate (GAVISCON) 95-358 MG/15ML SUSP Take 15 mLs by mouth as needed for indigestion or heartburn.    [provider]  apixaban (ELIQUIS) 5 MG TABS tablet Take 1 tablet (5 mg total) by mouth 2 (two) times daily. 08/11/22   Camnitz, Andree Coss, MD  CALCIUM PO Take 500 mg by mouth daily.    [provider]  Cholecalciferol (VITAMIN D3) 2000 UNITS TABS Take 2,000 Units by  mouth daily.    [provider]  dicyclomine (BENTYL) 10 MG capsule Take 1 capsule (10 mg total) by mouth 4 (four) times daily as needed for spasms. 06/28/21   Meryl Dare, MD  famotidine (PEPCID) 20 MG tablet Take 20 mg by mouth as needed.    [provider]  folic acid (FOLVITE) 1 MG tablet Take 1 mg by mouth 4 (four) times daily.    [provider]  furosemide (LASIX) 40 MG tablet Take 1 tablet (40 mg total) by mouth 2 (two) times daily.  08/18/15   [name], Leana Roe, MD  guaiFENesin (MUCINEX) 600 MG 12 hr tablet Take 1 tablet (600 mg total) by mouth 2 (two) times daily. 06/11/21   Meredeth Ide, MD  hydrocortisone (ANUSOL-HC) 2.5 % rectal cream Place 1 application. rectally 2 (two) times daily. 10/06/21   Zehr, Princella Pellegrini, PA-C  levothyroxine (SYNTHROID) 88 MCG tablet Take 88 mcg by mouth daily.  07/07/17   [provider]  loperamide (IMODIUM A-D) 2 MG tablet Take 2 mg by mouth 4 (four) times daily as needed for diarrhea or loose stools.    [provider]  losartan (COZAAR) 25 MG tablet Take 1 tablet (25 mg total) by mouth daily. 01/11/22   Graciella Freer, PA-C  methotrexate (RHEUMATREX) 2.5 MG tablet Take 12.5 mg by mouth every Wednesday.  09/17/14   [provider]  metoprolol succinate (TOPROL-XL) 25 MG 24 hr tablet TAKE 1 AND 1/2 TABLETS BY MOUTH  TWICE DAILY 06/29/22   Graciella Freer, PA-C  Multiple Vitamin (MULTIVITAMIN) tablet Take 1 tablet by mouth daily.    [provider]  nystatin-triamcinolone ointment (MYCOLOG) APPLY 1 APPLICATION. TOPICALLY 2 (TWO) TIMES DAILY. 10/15/21   Zehr, Princella Pellegrini, PA-C  OVER THE COUNTER MEDICATION Place 1 drop into both eyes daily as needed (for dry eyes. Advanced eye relief).    [provider]  phenylephrine-shark liver oil-mineral oil-petrolatum (PREPARATION H) 0.25-3-14-71.9 % rectal ointment Place 1 application rectally as needed for hemorrhoids.    [provider]  polyethylene glycol powder (GLYCOLAX/MIRALAX) 17 GM/SCOOP powder Take 17 g by mouth as needed.    [provider]  potassium chloride SA (KLOR-CON M20) 20 MEQ tablet Take 1 tablet (20 mEq total) by mouth 2 (two) times daily. 06/11/21   Meredeth Ide, MD  predniSONE (DELTASONE) 5 MG tablet Take 5 mg by mouth daily with breakfast.    [provider]  rosuvastatin (CRESTOR) 5 MG tablet Take 5 mg by mouth every other day.     [provider]  saccharomyces boulardii (FLORASTOR) 250 MG capsule Take 250 mg by mouth 2 (two) times daily.    [provider]  shark liver oil-cocoa butter (PREPARATION H) 0.25-3-85.5 % suppository Place 1 suppository rectally as needed for hemorrhoids.    [provider]  sotalol (BETAPACE) 80 MG tablet TAKE 1 TABLET BY MOUTH TWICE  DAILY 07/12/22   Graciella Freer, PA-C  vitamin B-12 (CYANOCOBALAMIN) 1000 MCG tablet Take 1,000 mcg by mouth daily.    [provider]    Family History Family History  Problem Relation Age of Onset   Colon cancer Mother        mets, spot on lungs and spine   Coronary artery disease Father    Emphysema Father    Heart attack Brother        x 2   Colon cancer Paternal Uncle    Kidney disease Brother    Hypertension Brother    Stroke Neg Hx    Esophageal cancer Neg Hx     Social History Social History   Tobacco Use   Smoking status: Never   Smokeless tobacco: Never  Vaping Use   Vaping Use: Never used  Substance Use Topics   Alcohol use: No   Drug use: No     Allergies   Butoconazole, Cephalexin, Lipitor [atorvastatin calcium], Butoconazole nitrate (1 dose), Cortisone, Hydrocodone, and Sotalol hcl   Review of Systems Review of Systems   Physical  Exam Triage Vital Signs ED Triage Vitals  Enc Vitals Group     BP 11/30/22 1142 (!) 183/94     Pulse Rate 11/30/22 1142 82     Resp 11/30/22 1142 15     Temp 11/30/22 1142 97.7 F (36.5 C)     Temp Source  11/30/22 1142 Oral     SpO2 11/30/22 1142 92 %     Weight --      Height --      Head Circumference --      Peak Flow --      Pain Score 11/30/22 1148 7     Pain Loc --      Pain Edu? --      Excl. in GC? --    No data found.  Updated Vital Signs BP (!) 159/93 (BP Location: Right Arm)   Pulse 75   Temp 97.7 F (36.5 C) (Oral)   Resp 15   SpO2 95%   Visual Acuity Right Eye Distance:   Left Eye Distance:   Bilateral Distance:    Right Eye Near:   Left Eye Near:    Bilateral Near:     Physical Exam Constitutional:      General: She is not in acute distress.    Appearance: She is obese. She is not ill-appearing.  Cardiovascular:     Rate and Rhythm: Normal rate and regular rhythm.  Pulmonary:     Effort: Pulmonary effort is normal.     Breath sounds: Normal breath sounds.  Chest:     Chest wall: Tenderness present. No deformity.    Abdominal:     General: Abdomen is flat. Bowel sounds are normal.     Palpations: Abdomen is soft.     Tenderness: There is abdominal tenderness.     Comments: Tenderness to palpation is in the left lower quadrant of her large pannus.  Musculoskeletal:     Left hip: Tenderness and bony tenderness present. No deformity.     Comments: Thoracic spine in kyphosis  Skin:    Findings: No bruising, erythema or rash.     Comments: No lesions on skin in the area where her pain is  Neurological:     Mental Status: She is alert.      UC Treatments / Results  Labs (all labs ordered are listed, but only abnormal results are displayed) Labs Reviewed - No data to display  EKG   Radiology DG Hip Unilat With Pelvis 2-3 Views Left  Result Date: 11/30/2022 CLINICAL DATA:  Trauma, fall, pain EXAM: DG HIP (WITH OR WITHOUT PELVIS) 2-3V LEFT COMPARISON:  None Available. FINDINGS: Study is technically limited due to patient's body habitus. No displaced fracture no dislocation is seen. Degenerative changes are noted in lower lumbar spine.  IMPRESSION: No displaced fracture or dislocation is seen and pelvis and left hip. Electronically Signed   By: Ernie Avena M.D.   On: 11/30/2022 13:47   DG Ribs Unilateral W/Chest Left  Result Date: 11/30/2022 CLINICAL DATA:  Trauma, left chest pain EXAM: LEFT RIBS AND CHEST - 3+ VIEW COMPARISON:  None Available. FINDINGS: Transverse diameter of heart is increased. Thoracic aorta is tortuous and ectatic. There are no signs of pulmonary edema or focal pulmonary consolidation. Small transverse linear densities in left lower lung field may suggest scarring or subsegmental atelectasis. No displaced fractures are seen and left ribs. There is previous vertebroplasty in 2 of the lower thoracic vertebral bodies. IMPRESSION: No recent displaced fracture  is seen in the left ribs. There is no focal pulmonary consolidation. There is no pleural effusion or pneumothorax. Electronically Signed   By: Ernie Avena M.D.   On: 11/30/2022 13:45    Procedures Procedures (including critical care time)  Medications Ordered in UC Medications - No data to display  Initial Impression / Assessment and Plan / UC Course  I have reviewed the triage vital signs and the nursing notes.  Pertinent labs & imaging results that were available during my care of the patient were reviewed by me and considered in my medical decision making (see chart for details).    Reassured patient no fracture on x-ray.  She and her husband seem relieved.  Discussed continuing to use pain management her symptoms and if she feels she is not getting better, to follow-up with her PCP  Final Clinical Impressions(s) / UC Diagnoses   Final diagnoses:  Pain of left hip  Rib pain on left side  Fall, initial encounter     Discharge Instructions      Try using only voltaren or only dicyclomine, using one at a time, to see which one seems to be helping. Follow up with your regular doctor if you aren't getting better or if you feel like  you are getting sick. The good news is nothing is broken.    ED Prescriptions   None    PDMP not reviewed this encounter.   Cathlyn Parsons, NP 11/30/22 1504

## 2022-11-30 NOTE — ED Triage Notes (Addendum)
Pt c/o left flank pain husband states she's hurting from her abdominal into her back, kidneys, and hip x 3 weeks. Pt states she has had surgery  2 different times for compression fractures.

## 2022-11-30 NOTE — Discharge Instructions (Addendum)
Try using only voltaren or only dicyclomine, using one at a time, to see which one seems to be helping. Follow up with your regular doctor if you aren't getting better or if you feel like you are getting sick. The good news is nothing is broken.

## 2022-12-06 DIAGNOSIS — Z85828 Personal history of other malignant neoplasm of skin: Secondary | ICD-10-CM | POA: Diagnosis not present

## 2022-12-06 DIAGNOSIS — D0462 Carcinoma in situ of skin of left upper limb, including shoulder: Secondary | ICD-10-CM | POA: Diagnosis not present

## 2022-12-06 DIAGNOSIS — L570 Actinic keratosis: Secondary | ICD-10-CM | POA: Diagnosis not present

## 2022-12-19 DIAGNOSIS — E039 Hypothyroidism, unspecified: Secondary | ICD-10-CM | POA: Diagnosis not present

## 2022-12-19 DIAGNOSIS — M109 Gout, unspecified: Secondary | ICD-10-CM | POA: Diagnosis not present

## 2022-12-19 DIAGNOSIS — I480 Paroxysmal atrial fibrillation: Secondary | ICD-10-CM | POA: Diagnosis not present

## 2022-12-19 DIAGNOSIS — Z79899 Other long term (current) drug therapy: Secondary | ICD-10-CM | POA: Diagnosis not present

## 2022-12-19 DIAGNOSIS — I2510 Atherosclerotic heart disease of native coronary artery without angina pectoris: Secondary | ICD-10-CM | POA: Diagnosis not present

## 2022-12-26 DIAGNOSIS — K2960 Other gastritis without bleeding: Secondary | ICD-10-CM | POA: Diagnosis not present

## 2022-12-26 DIAGNOSIS — Z0000 Encounter for general adult medical examination without abnormal findings: Secondary | ICD-10-CM | POA: Diagnosis not present

## 2022-12-26 DIAGNOSIS — I700 Atherosclerosis of aorta: Secondary | ICD-10-CM | POA: Diagnosis not present

## 2022-12-26 DIAGNOSIS — I5032 Chronic diastolic (congestive) heart failure: Secondary | ICD-10-CM | POA: Diagnosis not present

## 2022-12-26 DIAGNOSIS — E039 Hypothyroidism, unspecified: Secondary | ICD-10-CM | POA: Diagnosis not present

## 2022-12-26 DIAGNOSIS — I480 Paroxysmal atrial fibrillation: Secondary | ICD-10-CM | POA: Diagnosis not present

## 2022-12-26 DIAGNOSIS — M3390 Dermatopolymyositis, unspecified, organ involvement unspecified: Secondary | ICD-10-CM | POA: Diagnosis not present

## 2022-12-26 DIAGNOSIS — G4733 Obstructive sleep apnea (adult) (pediatric): Secondary | ICD-10-CM | POA: Diagnosis not present

## 2022-12-26 DIAGNOSIS — Z7952 Long term (current) use of systemic steroids: Secondary | ICD-10-CM | POA: Diagnosis not present

## 2022-12-26 DIAGNOSIS — M5136 Other intervertebral disc degeneration, lumbar region: Secondary | ICD-10-CM | POA: Diagnosis not present

## 2022-12-26 DIAGNOSIS — Z7901 Long term (current) use of anticoagulants: Secondary | ICD-10-CM | POA: Diagnosis not present

## 2022-12-26 DIAGNOSIS — Z79899 Other long term (current) drug therapy: Secondary | ICD-10-CM | POA: Diagnosis not present

## 2023-01-01 ENCOUNTER — Other Ambulatory Visit (HOSPITAL_COMMUNITY): Payer: Self-pay | Admitting: Student

## 2023-01-01 DIAGNOSIS — I10 Essential (primary) hypertension: Secondary | ICD-10-CM

## 2023-01-17 ENCOUNTER — Telehealth: Payer: Self-pay | Admitting: Cardiology

## 2023-01-17 ENCOUNTER — Other Ambulatory Visit: Payer: Self-pay

## 2023-01-17 DIAGNOSIS — I480 Paroxysmal atrial fibrillation: Secondary | ICD-10-CM

## 2023-01-17 MED ORDER — APIXABAN 5 MG PO TABS
5.0000 mg | ORAL_TABLET | Freq: Two times a day (BID) | ORAL | 0 refills | Status: DC
Start: 2023-01-17 — End: 2023-10-25

## 2023-01-17 NOTE — Telephone Encounter (Signed)
Prescription refill request for Eliquis received. Indication:afib Last office visit:11/23 NWG:NFAOZ labs Age: 77 Weight:108  kg  Prescription refilled

## 2023-01-17 NOTE — Telephone Encounter (Signed)
*  STAT* If patient is at the pharmacy, call can be transferred to refill team.   1. Which medications need to be refilled? (please list name of each medication and dose if known)   apixaban (ELIQUIS) 5 MG TABS tablet    2. Which pharmacy/location (including street and city if local pharmacy) is medication to be sent to? [id number] - [place], [place] - [address]  3. Do they need a 30 day or 90 day supply?  30 day

## 2023-02-14 DIAGNOSIS — M5136 Other intervertebral disc degeneration, lumbar region: Secondary | ICD-10-CM | POA: Diagnosis not present

## 2023-02-14 DIAGNOSIS — M109 Gout, unspecified: Secondary | ICD-10-CM | POA: Diagnosis not present

## 2023-02-14 DIAGNOSIS — M549 Dorsalgia, unspecified: Secondary | ICD-10-CM | POA: Diagnosis not present

## 2023-02-14 DIAGNOSIS — Z23 Encounter for immunization: Secondary | ICD-10-CM | POA: Diagnosis not present

## 2023-02-14 DIAGNOSIS — M150 Primary generalized (osteo)arthritis: Secondary | ICD-10-CM | POA: Diagnosis not present

## 2023-02-14 DIAGNOSIS — M810 Age-related osteoporosis without current pathological fracture: Secondary | ICD-10-CM | POA: Diagnosis not present

## 2023-02-14 DIAGNOSIS — Z7952 Long term (current) use of systemic steroids: Secondary | ICD-10-CM | POA: Diagnosis not present

## 2023-02-14 DIAGNOSIS — Z79899 Other long term (current) drug therapy: Secondary | ICD-10-CM | POA: Diagnosis not present

## 2023-02-14 DIAGNOSIS — M3390 Dermatopolymyositis, unspecified, organ involvement unspecified: Secondary | ICD-10-CM | POA: Diagnosis not present

## 2023-02-14 DIAGNOSIS — M1990 Unspecified osteoarthritis, unspecified site: Secondary | ICD-10-CM | POA: Diagnosis not present

## 2023-03-23 DIAGNOSIS — L570 Actinic keratosis: Secondary | ICD-10-CM | POA: Diagnosis not present

## 2023-04-12 ENCOUNTER — Other Ambulatory Visit (HOSPITAL_COMMUNITY): Payer: Self-pay | Admitting: Student

## 2023-04-12 DIAGNOSIS — H40033 Anatomical narrow angle, bilateral: Secondary | ICD-10-CM | POA: Diagnosis not present

## 2023-04-12 DIAGNOSIS — H43393 Other vitreous opacities, bilateral: Secondary | ICD-10-CM | POA: Diagnosis not present

## 2023-04-12 DIAGNOSIS — I10 Essential (primary) hypertension: Secondary | ICD-10-CM

## 2023-05-31 ENCOUNTER — Encounter: Payer: Medicare Other | Admitting: Cardiology

## 2023-06-02 ENCOUNTER — Ambulatory Visit: Payer: Medicare Other | Attending: Student | Admitting: Student

## 2023-06-02 ENCOUNTER — Encounter: Payer: Self-pay | Admitting: Student

## 2023-06-02 VITALS — BP 100/0 | HR 69 | Ht <= 58 in | Wt 244.0 lb

## 2023-06-02 DIAGNOSIS — D6869 Other thrombophilia: Secondary | ICD-10-CM

## 2023-06-02 DIAGNOSIS — I5032 Chronic diastolic (congestive) heart failure: Secondary | ICD-10-CM

## 2023-06-02 DIAGNOSIS — I480 Paroxysmal atrial fibrillation: Secondary | ICD-10-CM

## 2023-06-02 DIAGNOSIS — Z79899 Other long term (current) drug therapy: Secondary | ICD-10-CM

## 2023-06-02 DIAGNOSIS — Z5181 Encounter for therapeutic drug level monitoring: Secondary | ICD-10-CM

## 2023-06-02 DIAGNOSIS — I10 Essential (primary) hypertension: Secondary | ICD-10-CM | POA: Diagnosis not present

## 2023-06-02 NOTE — Patient Instructions (Signed)
Medication Instructions:  Your physician recommends that you continue on your current medications as directed. Please refer to the Current Medication list given to you today.  *If you need a refill on your cardiac medications before your next appointment, please call your pharmacy*  Lab Work: BMET, MAG, CBC-TODAY If you have labs (blood work) drawn today and your tests are completely normal, you will receive your results only by: MyChart Message (if you have MyChart) OR A paper copy in the mail If you have any lab test that is abnormal or we need to change your treatment, we will call you to review the results.  Follow-Up: At [facility], you and your health needs are our priority.  As part of our continuing mission to provide you with exceptional heart care, we have created designated Provider Care Teams.  These Care Teams include your primary Cardiologist (physician) and Advanced Practice Providers (APPs -  Physician Assistants and Nurse Practitioners) who all work together to provide you with the care you need, when you need it.  Your next appointment:   6 month(s)  Provider:   Casimiro Needle "Otilio Saber, PA-C

## 2023-06-02 NOTE — Progress Notes (Signed)
  Electrophysiology Office Note:   Date:  06/02/2023  ID:  [name], DOB [birth year], MRN [id number]  Primary Cardiologist: None Electrophysiologist: Will Jorja Loa, MD      History of Present Illness:   [name] is a 77 y.o. female with h/o PAF, OSA, HTN, morbid obesity, h/o CAD, and chronic diastolic CHF seen today for routine electrophysiology followup.   Since last being seen in our clinic the patient reports doing OK. She has many general complaints. States she has palpitations from time to time, but no persistent AF. Has chronic gall stones as well as chronic venous stasis. Fatigued with activity, and very sedentary. Denies CP or undue SOB. No syncope.   Review of systems complete and found to be negative unless listed in HPI.   EP Information / Studies Reviewed:    EKG is ordered today. Personal review as below.  EKG Interpretation Date/Time:  Friday June 02 2023 11:58:38 EST Ventricular Rate:  69 PR Interval:  154 QRS Duration:  80 QT Interval:  400 QTC Calculation: 428 R Axis:   -26  Text Interpretation: Normal sinus rhythm Anterior infarct , age undetermined Confirmed by Maxine Glenn [phone]) on 06/02/2023 12:06:20 PM    Device history Loop recorder implanted 12/2018 -> ERI 04/2022  Physical Exam:   VS:  Pulse 69   Ht 4\' 8"  (1.422 m)   Wt 244 lb (110.7 kg)   SpO2 98%   BMI 54.70 kg/m    Wt Readings from Last 3 Encounters:  06/02/23 244 lb (110.7 kg)  05/03/22 238 lb (108 kg)  10/06/21 227 lb 8 oz (103.2 kg)     GEN: Well nourished, well developed in no acute distress NECK: No JVD; No carotid bruits CARDIAC: Regular rate and rhythm, no murmurs, rubs, gallops RESPIRATORY:  Clear to auscultation without rales, wheezing or rhonchi  ABDOMEN: Soft, non-tender, non-distended EXTREMITIES:  No edema; No deformity   ASSESSMENT AND PLAN:    Paroxysmal AF EKG today shows stable intervals on Sotalol Continue sotalol 80 mg BID Continue toprol 50 mg  daily for now.  Continue Eliquis 5 mg BID for CHA2DS2VASc of at least 4 Follow every 6 months for sotalol Surveillance labs today.   OSA  Severe, intolerant to CPAP  HTN Difficult to obtain due to body habitus 100 over palp today.   Obesity Body mass index is 54.7 kg/m.  Encouraged lifestyle modification Stressed important of dietary changes if unable to be active.    Follow up with Dr. Elberta Fortis in 6 months  Signed, Graciella Freer, PA-C

## 2023-06-03 LAB — BASIC METABOLIC PANEL
BUN/Creatinine Ratio: 23 (ref 12–28)
BUN: 20 mg/dL (ref 8–27)
CO2: 24 mmol/L (ref 20–29)
Calcium: 9.3 mg/dL (ref 8.7–10.3)
Chloride: 105 mmol/L (ref 96–106)
Creatinine, Ser: 0.88 mg/dL (ref 0.57–1.00)
Glucose: 89 mg/dL (ref 70–99)
Potassium: 4.1 mmol/L (ref 3.5–5.2)
Sodium: 146 mmol/L — ABNORMAL HIGH (ref 134–144)
eGFR: 68 mL/min/{1.73_m2} (ref >59)

## 2023-06-03 LAB — CBC
Hematocrit: 43.9 % (ref 34.0–46.6)
Hemoglobin: 14.5 g/dL (ref 11.1–15.9)
MCH: 34.2 pg — ABNORMAL HIGH (ref 26.6–33.0)
MCHC: 33 g/dL (ref 31.5–35.7)
MCV: 104 fL — ABNORMAL HIGH (ref 79–97)
Platelets: 194 10*3/uL (ref 150–450)
RBC: 4.24 x10E6/uL (ref 3.77–5.28)
RDW: 13.6 % (ref 11.7–15.4)
WBC: 8.9 10*3/uL (ref 3.4–10.8)

## 2023-06-03 LAB — MAGNESIUM: Magnesium: 2 mg/dL (ref 1.6–2.3)

## 2023-06-13 ENCOUNTER — Other Ambulatory Visit: Payer: Self-pay | Admitting: Student

## 2023-07-03 DIAGNOSIS — R059 Cough, unspecified: Secondary | ICD-10-CM | POA: Diagnosis not present

## 2023-07-13 ENCOUNTER — Other Ambulatory Visit (HOSPITAL_COMMUNITY): Payer: Self-pay | Admitting: Student

## 2023-07-13 DIAGNOSIS — I10 Essential (primary) hypertension: Secondary | ICD-10-CM

## 2023-07-20 DIAGNOSIS — I480 Paroxysmal atrial fibrillation: Secondary | ICD-10-CM | POA: Diagnosis not present

## 2023-07-20 DIAGNOSIS — E039 Hypothyroidism, unspecified: Secondary | ICD-10-CM | POA: Diagnosis not present

## 2023-07-20 DIAGNOSIS — I5032 Chronic diastolic (congestive) heart failure: Secondary | ICD-10-CM | POA: Diagnosis not present

## 2023-07-20 DIAGNOSIS — Z79899 Other long term (current) drug therapy: Secondary | ICD-10-CM | POA: Diagnosis not present

## 2023-07-20 DIAGNOSIS — I700 Atherosclerosis of aorta: Secondary | ICD-10-CM | POA: Diagnosis not present

## 2023-07-20 DIAGNOSIS — Z7952 Long term (current) use of systemic steroids: Secondary | ICD-10-CM | POA: Diagnosis not present

## 2023-07-24 DIAGNOSIS — M3390 Dermatopolymyositis, unspecified, organ involvement unspecified: Secondary | ICD-10-CM | POA: Diagnosis not present

## 2023-07-24 DIAGNOSIS — M1990 Unspecified osteoarthritis, unspecified site: Secondary | ICD-10-CM | POA: Diagnosis not present

## 2023-07-24 DIAGNOSIS — M109 Gout, unspecified: Secondary | ICD-10-CM | POA: Diagnosis not present

## 2023-07-24 DIAGNOSIS — Z7952 Long term (current) use of systemic steroids: Secondary | ICD-10-CM | POA: Diagnosis not present

## 2023-07-24 DIAGNOSIS — Z79899 Other long term (current) drug therapy: Secondary | ICD-10-CM | POA: Diagnosis not present

## 2023-07-24 DIAGNOSIS — M549 Dorsalgia, unspecified: Secondary | ICD-10-CM | POA: Diagnosis not present

## 2023-07-24 DIAGNOSIS — M150 Primary generalized (osteo)arthritis: Secondary | ICD-10-CM | POA: Diagnosis not present

## 2023-07-24 DIAGNOSIS — M810 Age-related osteoporosis without current pathological fracture: Secondary | ICD-10-CM | POA: Diagnosis not present

## 2023-07-26 ENCOUNTER — Telehealth: Payer: Self-pay

## 2023-07-26 DIAGNOSIS — R7303 Prediabetes: Secondary | ICD-10-CM | POA: Diagnosis not present

## 2023-07-26 DIAGNOSIS — M109 Gout, unspecified: Secondary | ICD-10-CM | POA: Diagnosis not present

## 2023-07-26 DIAGNOSIS — K8020 Calculus of gallbladder without cholecystitis without obstruction: Secondary | ICD-10-CM | POA: Diagnosis not present

## 2023-07-26 DIAGNOSIS — M150 Primary generalized (osteo)arthritis: Secondary | ICD-10-CM | POA: Diagnosis not present

## 2023-07-26 DIAGNOSIS — I2510 Atherosclerotic heart disease of native coronary artery without angina pectoris: Secondary | ICD-10-CM | POA: Diagnosis not present

## 2023-07-26 DIAGNOSIS — J309 Allergic rhinitis, unspecified: Secondary | ICD-10-CM | POA: Diagnosis not present

## 2023-07-26 DIAGNOSIS — I480 Paroxysmal atrial fibrillation: Secondary | ICD-10-CM | POA: Diagnosis not present

## 2023-07-26 DIAGNOSIS — Z7901 Long term (current) use of anticoagulants: Secondary | ICD-10-CM | POA: Diagnosis not present

## 2023-07-26 DIAGNOSIS — E039 Hypothyroidism, unspecified: Secondary | ICD-10-CM | POA: Diagnosis not present

## 2023-07-26 DIAGNOSIS — R059 Cough, unspecified: Secondary | ICD-10-CM | POA: Diagnosis not present

## 2023-07-26 DIAGNOSIS — K219 Gastro-esophageal reflux disease without esophagitis: Secondary | ICD-10-CM | POA: Diagnosis not present

## 2023-07-26 NOTE — Patient Instructions (Signed)
Visit Information  Thank you for taking time to visit with me today. Please don't hesitate to contact me if I can be of assistance to you.   Following are the goals we discussed today:   Goals Addressed             This Visit's Progress    COMPLETED: Care coordination activities-no follow up required       Care Coordination Interventions: Advised patient to Annual Wellness exam. Discussed services and support. Advised to discuss with primary care physician if services needed in the future.          If you are experiencing a Mental Health or Behavioral Health Crisis or need someone to talk to, please call the Suicide and Crisis Lifeline: 988   Patient verbalizes understanding of instructions and care plan provided today and agrees to view in MyChart. Active MyChart status and patient understanding of how to access instructions and care plan via MyChart confirmed with patient.     The patient has been provided with contact information for the care management team and has been advised to call with any health related questions or concerns.   Bary Leriche RN, MSN [facility], [facility] RN Care Manager Direct Dial: [phone]  Fax: [phone] Website: [email or web address]

## 2023-07-26 NOTE — Patient Outreach (Signed)
  Care Coordination   In Person Provider Office Visit Note   07/26/2023 Name: [name] MRN: [id number] DOB: 09-21-1945  [name] is a 78 y.o. year old female who sees Irena Reichmann, [place] for primary care. I engaged with [name] in the providers office today.  What matters to the patients health and wellness today?  none  Goals Addressed             This Visit's Progress    COMPLETED: Care coordination activities-no follow up required       Care Coordination Interventions: Advised patient to Annual Wellness exam. Discussed services and support. Advised to discuss with primary care physician if services needed in the future.         SDOH assessments and interventions completed:  No     Care Coordination Interventions:  No, not indicated   Follow up plan: No further intervention required.   Encounter Outcome:  Patient Visit Completed   Bary Leriche RN, MSN [facility], [facility] RN Care Manager Direct Dial: [phone]  Fax: [phone] Website: [email or web address]

## 2023-09-06 DIAGNOSIS — I890 Lymphedema, not elsewhere classified: Secondary | ICD-10-CM | POA: Diagnosis not present

## 2023-09-06 DIAGNOSIS — M150 Primary generalized (osteo)arthritis: Secondary | ICD-10-CM | POA: Diagnosis not present

## 2023-09-06 DIAGNOSIS — H9193 Unspecified hearing loss, bilateral: Secondary | ICD-10-CM | POA: Diagnosis not present

## 2023-09-12 DIAGNOSIS — I890 Lymphedema, not elsewhere classified: Secondary | ICD-10-CM | POA: Diagnosis not present

## 2023-09-12 DIAGNOSIS — I87393 Chronic venous hypertension (idiopathic) with other complications of bilateral lower extremity: Secondary | ICD-10-CM | POA: Diagnosis not present

## 2023-09-12 DIAGNOSIS — I872 Venous insufficiency (chronic) (peripheral): Secondary | ICD-10-CM | POA: Diagnosis not present

## 2023-09-12 DIAGNOSIS — M79661 Pain in right lower leg: Secondary | ICD-10-CM | POA: Diagnosis not present

## 2023-09-12 DIAGNOSIS — M79662 Pain in left lower leg: Secondary | ICD-10-CM | POA: Diagnosis not present

## 2023-09-14 ENCOUNTER — Telehealth: Payer: Self-pay | Admitting: Licensed Clinical Social Worker

## 2023-09-14 DIAGNOSIS — I5032 Chronic diastolic (congestive) heart failure: Secondary | ICD-10-CM

## 2023-09-14 NOTE — Patient Outreach (Signed)
 CSW received phone call from pt's primary care office, [facility], requesting VBCI referral regarding benefits navigation for DME. CSW agreed to complete referral and updated office on referral process. Referral completed.  Kenton Kingfisher, LCSW [facility], [facility] Licensed Clinical Social Worker Care Coordinator [phone]

## 2023-09-18 ENCOUNTER — Telehealth: Payer: Self-pay | Admitting: *Deleted

## 2023-09-18 NOTE — Progress Notes (Unsigned)
 Complex Care Management Note Care Guide Note  09/18/2023 Name: [name] MRN: [id number] DOB: 1945/11/17   Complex Care Management Outreach Attempts: An unsuccessful telephone outreach was attempted today to offer the patient information about available complex care management services.  Follow Up Plan:  Additional outreach attempts will be made to offer the patient complex care management information and services.   Encounter Outcome:  No Answer  Burman Nieves, CMA [place]  [facility], [facility] Guide Direct Dial: [phone]  Fax: [phone] Website: [email or web address]

## 2023-09-19 NOTE — Progress Notes (Signed)
 Complex Care Management Note  Care Guide Note 09/19/2023 Name: CEILIDH TORREGROSSA MRN: [id number] DOB: [birth year]  Karren Cobble is a 78 y.o. year old female who sees Irena Reichmann, [place] for primary care. I reached out to Karren Cobble by phone today to offer complex care management services.  Ms. Valls was given information about Complex Care Management services today including:   The Complex Care Management services include support from the care team which includes your Nurse Care Manager, Clinical Social Worker, or Pharmacist.  The Complex Care Management team is here to help remove barriers to the health concerns and goals most important to you. Complex Care Management services are voluntary, and the patient may decline or stop services at any time by request to their care team member.   Complex Care Management Consent Status: Patient did not agree to participate in complex care management services at this time.  Encounter Outcome:  Patient Refused  Burman Nieves, CMA [place]  [facility], [facility] Guide Direct Dial: [phone]  Fax: [phone] Website: [email or web address]

## 2023-09-21 DIAGNOSIS — L309 Dermatitis, unspecified: Secondary | ICD-10-CM | POA: Diagnosis not present

## 2023-09-21 DIAGNOSIS — B372 Candidiasis of skin and nail: Secondary | ICD-10-CM | POA: Diagnosis not present

## 2023-09-21 DIAGNOSIS — Z85828 Personal history of other malignant neoplasm of skin: Secondary | ICD-10-CM | POA: Diagnosis not present

## 2023-09-21 DIAGNOSIS — C44622 Squamous cell carcinoma of skin of right upper limb, including shoulder: Secondary | ICD-10-CM | POA: Diagnosis not present

## 2023-09-22 DIAGNOSIS — M79605 Pain in left leg: Secondary | ICD-10-CM | POA: Diagnosis not present

## 2023-09-22 DIAGNOSIS — M79604 Pain in right leg: Secondary | ICD-10-CM | POA: Diagnosis not present

## 2023-09-22 DIAGNOSIS — I890 Lymphedema, not elsewhere classified: Secondary | ICD-10-CM | POA: Diagnosis not present

## 2023-09-28 DIAGNOSIS — I87393 Chronic venous hypertension (idiopathic) with other complications of bilateral lower extremity: Secondary | ICD-10-CM | POA: Diagnosis not present

## 2023-09-28 DIAGNOSIS — I890 Lymphedema, not elsewhere classified: Secondary | ICD-10-CM | POA: Diagnosis not present

## 2023-09-28 DIAGNOSIS — R600 Localized edema: Secondary | ICD-10-CM | POA: Diagnosis not present

## 2023-10-04 DIAGNOSIS — I83019 Varicose veins of right lower extremity with ulcer of unspecified site: Secondary | ICD-10-CM | POA: Diagnosis not present

## 2023-10-04 DIAGNOSIS — R600 Localized edema: Secondary | ICD-10-CM | POA: Diagnosis not present

## 2023-10-04 DIAGNOSIS — I83029 Varicose veins of left lower extremity with ulcer of unspecified site: Secondary | ICD-10-CM | POA: Diagnosis not present

## 2023-10-04 DIAGNOSIS — I890 Lymphedema, not elsewhere classified: Secondary | ICD-10-CM | POA: Diagnosis not present

## 2023-10-05 DIAGNOSIS — I890 Lymphedema, not elsewhere classified: Secondary | ICD-10-CM | POA: Diagnosis not present

## 2023-11-12 DEATH — deceased
